# Patient Record
Sex: Female | Born: 1940 | Race: White | Hispanic: No | Smoking: Former smoker
Health system: Southern US, Community
[De-identification: ages and names within clinical notes are randomized; demographics above are authoritative.]

## PROBLEM LIST (undated history)

## (undated) DIAGNOSIS — I2510 Atherosclerotic heart disease of native coronary artery without angina pectoris: Secondary | ICD-10-CM

## (undated) DIAGNOSIS — E785 Hyperlipidemia, unspecified: Secondary | ICD-10-CM

## (undated) DIAGNOSIS — T4145XA Adverse effect of unspecified anesthetic, initial encounter: Secondary | ICD-10-CM

## (undated) DIAGNOSIS — Z8719 Personal history of other diseases of the digestive system: Secondary | ICD-10-CM

## (undated) DIAGNOSIS — E0590 Thyrotoxicosis, unspecified without thyrotoxic crisis or storm: Secondary | ICD-10-CM

## (undated) DIAGNOSIS — IMO0002 Reserved for concepts with insufficient information to code with codable children: Secondary | ICD-10-CM

## (undated) DIAGNOSIS — M1990 Unspecified osteoarthritis, unspecified site: Secondary | ICD-10-CM

## (undated) DIAGNOSIS — N183 Chronic kidney disease, stage 3 unspecified: Secondary | ICD-10-CM

## (undated) DIAGNOSIS — K219 Gastro-esophageal reflux disease without esophagitis: Secondary | ICD-10-CM

## (undated) DIAGNOSIS — C439 Malignant melanoma of skin, unspecified: Secondary | ICD-10-CM

## (undated) DIAGNOSIS — R0600 Dyspnea, unspecified: Secondary | ICD-10-CM

## (undated) DIAGNOSIS — R519 Headache, unspecified: Secondary | ICD-10-CM

## (undated) DIAGNOSIS — R51 Headache: Secondary | ICD-10-CM

## (undated) DIAGNOSIS — T8859XA Other complications of anesthesia, initial encounter: Secondary | ICD-10-CM

## (undated) DIAGNOSIS — Z923 Personal history of irradiation: Secondary | ICD-10-CM

## (undated) DIAGNOSIS — H3530 Unspecified macular degeneration: Secondary | ICD-10-CM

## (undated) DIAGNOSIS — D649 Anemia, unspecified: Secondary | ICD-10-CM

## (undated) DIAGNOSIS — R251 Tremor, unspecified: Secondary | ICD-10-CM

## (undated) DIAGNOSIS — IMO0001 Reserved for inherently not codable concepts without codable children: Secondary | ICD-10-CM

## (undated) DIAGNOSIS — C539 Malignant neoplasm of cervix uteri, unspecified: Secondary | ICD-10-CM

## (undated) DIAGNOSIS — C3490 Malignant neoplasm of unspecified part of unspecified bronchus or lung: Secondary | ICD-10-CM

## (undated) DIAGNOSIS — I10 Essential (primary) hypertension: Secondary | ICD-10-CM

## (undated) DIAGNOSIS — E039 Hypothyroidism, unspecified: Secondary | ICD-10-CM

## (undated) DIAGNOSIS — I447 Left bundle-branch block, unspecified: Secondary | ICD-10-CM

## (undated) HISTORY — PX: TONSILLECTOMY: SUR1361

## (undated) HISTORY — PX: TONSILLECTOMY: SHX5217

## (undated) HISTORY — DX: Chronic kidney disease, stage 3 unspecified: N18.30

## (undated) HISTORY — DX: Atherosclerotic heart disease of native coronary artery without angina pectoris: I25.10

## (undated) HISTORY — DX: Essential (primary) hypertension: I10

## (undated) HISTORY — DX: Chronic kidney disease, stage 3 (moderate): N18.3

## (undated) HISTORY — DX: Thyrotoxicosis, unspecified without thyrotoxic crisis or storm: E05.90

## (undated) HISTORY — DX: Malignant melanoma of skin, unspecified: C43.9

## (undated) HISTORY — DX: Hyperlipidemia, unspecified: E78.5

## (undated) HISTORY — DX: Left bundle-branch block, unspecified: I44.7

## (undated) HISTORY — PX: CATARACT EXTRACTION, BILATERAL: SHX1313

## (undated) HISTORY — PX: OTHER SURGICAL HISTORY: SHX169

## (undated) HISTORY — DX: Tremor, unspecified: R25.1

---

## 1997-11-26 ENCOUNTER — Other Ambulatory Visit: Admission: RE | Admit: 1997-11-26 | Discharge: 1997-11-26 | Payer: Self-pay | Admitting: Obstetrics and Gynecology

## 2000-07-17 ENCOUNTER — Other Ambulatory Visit: Admission: RE | Admit: 2000-07-17 | Discharge: 2000-07-17 | Payer: Self-pay | Admitting: Obstetrics and Gynecology

## 2004-03-19 ENCOUNTER — Encounter: Admission: RE | Admit: 2004-03-19 | Discharge: 2004-03-19 | Payer: Self-pay | Admitting: Orthopedic Surgery

## 2004-08-10 ENCOUNTER — Ambulatory Visit: Payer: Self-pay | Admitting: Cardiology

## 2004-08-18 ENCOUNTER — Ambulatory Visit: Payer: Self-pay | Admitting: Cardiology

## 2004-08-27 ENCOUNTER — Ambulatory Visit (HOSPITAL_COMMUNITY): Admission: RE | Admit: 2004-08-27 | Discharge: 2004-08-27 | Payer: Self-pay | Admitting: Cardiology

## 2004-09-01 ENCOUNTER — Ambulatory Visit: Payer: Self-pay | Admitting: Internal Medicine

## 2004-09-08 ENCOUNTER — Ambulatory Visit: Payer: Self-pay | Admitting: Pulmonary Disease

## 2004-09-21 ENCOUNTER — Ambulatory Visit: Payer: Self-pay | Admitting: Internal Medicine

## 2004-10-11 ENCOUNTER — Encounter (HOSPITAL_COMMUNITY): Admission: RE | Admit: 2004-10-11 | Discharge: 2005-01-09 | Payer: Self-pay | Admitting: Internal Medicine

## 2004-11-12 ENCOUNTER — Ambulatory Visit: Payer: Self-pay | Admitting: Pulmonary Disease

## 2004-11-24 ENCOUNTER — Ambulatory Visit (HOSPITAL_COMMUNITY): Admission: RE | Admit: 2004-11-24 | Discharge: 2004-11-24 | Payer: Self-pay | Admitting: Pulmonary Disease

## 2004-11-24 ENCOUNTER — Encounter (INDEPENDENT_AMBULATORY_CARE_PROVIDER_SITE_OTHER): Payer: Self-pay | Admitting: Specialist

## 2004-11-24 ENCOUNTER — Ambulatory Visit (HOSPITAL_COMMUNITY): Admission: RE | Admit: 2004-11-24 | Discharge: 2004-11-24 | Payer: Self-pay | Admitting: Internal Medicine

## 2005-04-05 ENCOUNTER — Ambulatory Visit: Payer: Self-pay | Admitting: Pulmonary Disease

## 2005-04-06 ENCOUNTER — Ambulatory Visit: Payer: Self-pay | Admitting: Cardiology

## 2005-06-27 ENCOUNTER — Ambulatory Visit: Payer: Self-pay | Admitting: Internal Medicine

## 2005-06-30 ENCOUNTER — Ambulatory Visit: Payer: Self-pay | Admitting: Pulmonary Disease

## 2005-07-14 ENCOUNTER — Ambulatory Visit: Payer: Self-pay | Admitting: Internal Medicine

## 2005-07-14 ENCOUNTER — Encounter: Payer: Self-pay | Admitting: Pulmonary Disease

## 2005-12-01 ENCOUNTER — Ambulatory Visit: Payer: Self-pay | Admitting: Cardiology

## 2005-12-23 ENCOUNTER — Ambulatory Visit: Payer: Self-pay | Admitting: Cardiology

## 2006-02-02 ENCOUNTER — Ambulatory Visit: Payer: Self-pay | Admitting: Pulmonary Disease

## 2006-02-06 ENCOUNTER — Ambulatory Visit: Payer: Self-pay | Admitting: Cardiovascular Disease

## 2006-02-06 ENCOUNTER — Encounter: Payer: Self-pay | Admitting: Pulmonary Disease

## 2006-06-30 ENCOUNTER — Ambulatory Visit: Payer: Self-pay | Admitting: Cardiology

## 2007-06-27 ENCOUNTER — Ambulatory Visit: Payer: Self-pay | Admitting: Cardiology

## 2008-06-23 ENCOUNTER — Telehealth: Payer: Self-pay | Admitting: Pulmonary Disease

## 2008-06-30 ENCOUNTER — Ambulatory Visit: Payer: Self-pay | Admitting: Cardiology

## 2008-07-11 HISTORY — PX: OTHER SURGICAL HISTORY: SHX169

## 2008-07-23 LAB — CONVERTED CEMR LAB: Pap Smear: NORMAL

## 2008-07-28 ENCOUNTER — Encounter: Payer: Self-pay | Admitting: Pulmonary Disease

## 2008-07-28 ENCOUNTER — Ambulatory Visit: Payer: Self-pay | Admitting: Cardiology

## 2008-07-30 ENCOUNTER — Telehealth (INDEPENDENT_AMBULATORY_CARE_PROVIDER_SITE_OTHER): Payer: Self-pay | Admitting: *Deleted

## 2008-08-04 ENCOUNTER — Ambulatory Visit (HOSPITAL_COMMUNITY): Admission: RE | Admit: 2008-08-04 | Discharge: 2008-08-04 | Payer: Self-pay | Admitting: Pulmonary Disease

## 2008-08-05 ENCOUNTER — Ambulatory Visit: Payer: Self-pay | Admitting: Pulmonary Disease

## 2008-08-06 ENCOUNTER — Ambulatory Visit: Payer: Self-pay | Admitting: Pulmonary Disease

## 2008-08-13 ENCOUNTER — Ambulatory Visit: Payer: Self-pay | Admitting: Thoracic Surgery

## 2008-08-20 LAB — HM MAMMOGRAPHY: HM Mammogram: NORMAL

## 2008-08-21 ENCOUNTER — Inpatient Hospital Stay (HOSPITAL_COMMUNITY): Admission: RE | Admit: 2008-08-21 | Discharge: 2008-08-27 | Payer: Self-pay | Admitting: Thoracic Surgery

## 2008-08-21 ENCOUNTER — Encounter: Payer: Self-pay | Admitting: Thoracic Surgery

## 2008-08-21 ENCOUNTER — Ambulatory Visit: Payer: Self-pay | Admitting: Thoracic Surgery

## 2008-08-21 ENCOUNTER — Ambulatory Visit: Payer: Self-pay | Admitting: Pulmonary Disease

## 2008-09-03 ENCOUNTER — Encounter: Admission: RE | Admit: 2008-09-03 | Discharge: 2008-09-03 | Payer: Self-pay | Admitting: Family Medicine

## 2008-09-03 ENCOUNTER — Encounter: Payer: Self-pay | Admitting: Internal Medicine

## 2008-09-03 ENCOUNTER — Ambulatory Visit: Payer: Self-pay | Admitting: Internal Medicine

## 2008-09-03 ENCOUNTER — Ambulatory Visit: Payer: Self-pay | Admitting: Thoracic Surgery

## 2008-09-16 ENCOUNTER — Encounter: Payer: Self-pay | Admitting: Internal Medicine

## 2008-09-16 LAB — CBC WITH DIFFERENTIAL/PLATELET
BASO%: 0.4 % (ref 0.0–2.0)
Basophils Absolute: 0 10*3/uL (ref 0.0–0.1)
HCT: 39.2 % (ref 34.8–46.6)
HGB: 13.1 g/dL (ref 11.6–15.9)
LYMPH%: 24.7 % (ref 14.0–49.7)
MCHC: 33.5 g/dL (ref 31.5–36.0)
MONO#: 1.1 10*3/uL — ABNORMAL HIGH (ref 0.1–0.9)
NEUT%: 54.6 % (ref 38.4–76.8)
Platelets: 289 10*3/uL (ref 145–400)
WBC: 9.4 10*3/uL (ref 3.9–10.3)
lymph#: 2.3 10*3/uL (ref 0.9–3.3)

## 2008-09-16 LAB — COMPREHENSIVE METABOLIC PANEL
ALT: 15 U/L (ref 0–35)
BUN: 21 mg/dL (ref 6–23)
CO2: 24 mEq/L (ref 19–32)
Calcium: 9.9 mg/dL (ref 8.4–10.5)
Chloride: 104 mEq/L (ref 96–112)
Creatinine, Ser: 1.08 mg/dL (ref 0.40–1.20)
Glucose, Bld: 102 mg/dL — ABNORMAL HIGH (ref 70–99)
Total Bilirubin: 0.3 mg/dL (ref 0.3–1.2)

## 2008-09-17 ENCOUNTER — Ambulatory Visit: Payer: Self-pay | Admitting: Thoracic Surgery

## 2008-09-17 ENCOUNTER — Encounter: Admission: RE | Admit: 2008-09-17 | Discharge: 2008-09-17 | Payer: Self-pay | Admitting: Thoracic Surgery

## 2008-09-18 ENCOUNTER — Ambulatory Visit (HOSPITAL_COMMUNITY): Admission: RE | Admit: 2008-09-18 | Discharge: 2008-09-18 | Payer: Self-pay | Admitting: Internal Medicine

## 2008-10-02 ENCOUNTER — Encounter: Payer: Self-pay | Admitting: Cardiology

## 2008-10-02 ENCOUNTER — Ambulatory Visit: Payer: Self-pay | Admitting: Cardiology

## 2008-10-08 ENCOUNTER — Encounter: Admission: RE | Admit: 2008-10-08 | Discharge: 2008-10-08 | Payer: Self-pay | Admitting: Thoracic Surgery

## 2008-10-08 ENCOUNTER — Ambulatory Visit: Payer: Self-pay | Admitting: Thoracic Surgery

## 2008-12-01 ENCOUNTER — Ambulatory Visit: Payer: Self-pay | Admitting: Cardiology

## 2008-12-01 DIAGNOSIS — I10 Essential (primary) hypertension: Secondary | ICD-10-CM

## 2008-12-11 ENCOUNTER — Ambulatory Visit: Payer: Self-pay | Admitting: Internal Medicine

## 2008-12-15 ENCOUNTER — Ambulatory Visit (HOSPITAL_COMMUNITY): Admission: RE | Admit: 2008-12-15 | Discharge: 2008-12-15 | Payer: Self-pay | Admitting: Internal Medicine

## 2008-12-15 ENCOUNTER — Other Ambulatory Visit: Admission: RE | Admit: 2008-12-15 | Discharge: 2008-12-15 | Payer: Self-pay | Admitting: Internal Medicine

## 2008-12-15 LAB — CBC WITH DIFFERENTIAL/PLATELET
BASO%: 0.3 % (ref 0.0–2.0)
Basophils Absolute: 0 10*3/uL (ref 0.0–0.1)
EOS%: 4 % (ref 0.0–7.0)
HCT: 45.8 % (ref 34.8–46.6)
LYMPH%: 25.6 % (ref 14.0–49.7)
MCH: 29.4 pg (ref 25.1–34.0)
MCHC: 34 g/dL (ref 31.5–36.0)
MCV: 86.4 fL (ref 79.5–101.0)
NEUT%: 55.6 % (ref 38.4–76.8)
Platelets: 198 10*3/uL (ref 145–400)

## 2008-12-15 LAB — COMPREHENSIVE METABOLIC PANEL
ALT: 30 U/L (ref 0–35)
AST: 27 U/L (ref 0–37)
BUN: 18 mg/dL (ref 6–23)
CO2: 25 mEq/L (ref 19–32)
Creatinine, Ser: 1.33 mg/dL — ABNORMAL HIGH (ref 0.40–1.20)
Total Bilirubin: 0.7 mg/dL (ref 0.3–1.2)

## 2008-12-17 ENCOUNTER — Encounter: Payer: Self-pay | Admitting: Internal Medicine

## 2008-12-17 ENCOUNTER — Encounter: Payer: Self-pay | Admitting: Cardiology

## 2008-12-17 ENCOUNTER — Ambulatory Visit: Payer: Self-pay | Admitting: Thoracic Surgery

## 2009-03-20 ENCOUNTER — Ambulatory Visit: Payer: Self-pay | Admitting: Internal Medicine

## 2009-03-24 ENCOUNTER — Ambulatory Visit (HOSPITAL_COMMUNITY): Admission: RE | Admit: 2009-03-24 | Discharge: 2009-03-24 | Payer: Self-pay | Admitting: Internal Medicine

## 2009-03-24 LAB — CBC WITH DIFFERENTIAL/PLATELET
BASO%: 0.4 % (ref 0.0–2.0)
EOS%: 6.4 % (ref 0.0–7.0)
HCT: 42.5 % (ref 34.8–46.6)
LYMPH%: 28.7 % (ref 14.0–49.7)
MCH: 30 pg (ref 25.1–34.0)
MCHC: 33.6 g/dL (ref 31.5–36.0)
MONO%: 12.5 % (ref 0.0–14.0)
NEUT%: 52 % (ref 38.4–76.8)
lymph#: 2.7 10*3/uL (ref 0.9–3.3)

## 2009-03-24 LAB — COMPREHENSIVE METABOLIC PANEL
ALT: 20 U/L (ref 0–35)
AST: 23 U/L (ref 0–37)
Alkaline Phosphatase: 52 U/L (ref 39–117)
Chloride: 104 mEq/L (ref 96–112)
Creatinine, Ser: 1.23 mg/dL — ABNORMAL HIGH (ref 0.40–1.20)
Total Bilirubin: 0.4 mg/dL (ref 0.3–1.2)

## 2009-03-26 ENCOUNTER — Encounter: Payer: Self-pay | Admitting: Internal Medicine

## 2009-03-26 ENCOUNTER — Encounter: Payer: Self-pay | Admitting: Cardiology

## 2009-04-01 ENCOUNTER — Ambulatory Visit: Payer: Self-pay | Admitting: Thoracic Surgery

## 2009-04-13 ENCOUNTER — Encounter: Payer: Self-pay | Admitting: Cardiology

## 2009-04-13 ENCOUNTER — Encounter (INDEPENDENT_AMBULATORY_CARE_PROVIDER_SITE_OTHER): Payer: Self-pay | Admitting: *Deleted

## 2009-04-14 ENCOUNTER — Ambulatory Visit: Payer: Self-pay | Admitting: Cardiology

## 2009-07-17 ENCOUNTER — Ambulatory Visit: Payer: Self-pay | Admitting: Internal Medicine

## 2009-07-21 ENCOUNTER — Ambulatory Visit (HOSPITAL_COMMUNITY): Admission: RE | Admit: 2009-07-21 | Discharge: 2009-07-21 | Payer: Self-pay | Admitting: Internal Medicine

## 2009-07-21 LAB — COMPREHENSIVE METABOLIC PANEL
ALT: 17 U/L (ref 0–35)
Albumin: 3.9 g/dL (ref 3.5–5.2)
Alkaline Phosphatase: 57 U/L (ref 39–117)
CO2: 29 mEq/L (ref 19–32)
Potassium: 3.7 mEq/L (ref 3.5–5.3)
Sodium: 139 mEq/L (ref 135–145)
Total Bilirubin: 0.6 mg/dL (ref 0.3–1.2)
Total Protein: 6.9 g/dL (ref 6.0–8.3)

## 2009-07-21 LAB — CBC WITH DIFFERENTIAL/PLATELET
BASO%: 0.4 % (ref 0.0–2.0)
Eosinophils Absolute: 0.4 10*3/uL (ref 0.0–0.5)
LYMPH%: 33.5 % (ref 14.0–49.7)
MCHC: 33.7 g/dL (ref 31.5–36.0)
MONO#: 1 10*3/uL — ABNORMAL HIGH (ref 0.1–0.9)
MONO%: 12.4 % (ref 0.0–14.0)
NEUT#: 4.1 10*3/uL (ref 1.5–6.5)
Platelets: 269 10*3/uL (ref 145–400)
RBC: 4.81 10*6/uL (ref 3.70–5.45)
RDW: 12.5 % (ref 11.2–14.5)
WBC: 8.4 10*3/uL (ref 3.9–10.3)

## 2009-07-27 ENCOUNTER — Encounter: Payer: Self-pay | Admitting: Internal Medicine

## 2009-07-28 ENCOUNTER — Ambulatory Visit: Payer: Self-pay | Admitting: Thoracic Surgery

## 2009-09-02 ENCOUNTER — Ambulatory Visit: Payer: Self-pay | Admitting: Internal Medicine

## 2009-09-02 ENCOUNTER — Encounter (INDEPENDENT_AMBULATORY_CARE_PROVIDER_SITE_OTHER): Payer: Self-pay | Admitting: *Deleted

## 2009-09-02 DIAGNOSIS — J069 Acute upper respiratory infection, unspecified: Secondary | ICD-10-CM | POA: Insufficient documentation

## 2009-09-02 LAB — CONVERTED CEMR LAB
Basophils Relative: 0 % (ref 0.0–3.0)
Eosinophils Relative: 2.9 % (ref 0.0–5.0)
Free T4: 1 ng/dL (ref 0.6–1.6)
Hemoglobin: 15.1 g/dL — ABNORMAL HIGH (ref 12.0–15.0)
Lymphocytes Relative: 27.8 % (ref 12.0–46.0)
Monocytes Relative: 12.7 % — ABNORMAL HIGH (ref 3.0–12.0)
Neutro Abs: 6.2 10*3/uL (ref 1.4–7.7)
RBC: 4.91 M/uL (ref 3.87–5.11)
TSH: 0.07 microintl units/mL — ABNORMAL LOW (ref 0.35–5.50)

## 2009-09-08 ENCOUNTER — Encounter: Admission: RE | Admit: 2009-09-08 | Discharge: 2009-09-08 | Payer: Self-pay | Admitting: Internal Medicine

## 2009-09-11 ENCOUNTER — Telehealth: Payer: Self-pay | Admitting: Internal Medicine

## 2009-09-25 ENCOUNTER — Ambulatory Visit: Payer: Self-pay | Admitting: Endocrinology

## 2009-09-25 DIAGNOSIS — E042 Nontoxic multinodular goiter: Secondary | ICD-10-CM | POA: Insufficient documentation

## 2009-10-14 ENCOUNTER — Encounter (HOSPITAL_COMMUNITY)
Admission: RE | Admit: 2009-10-14 | Discharge: 2010-01-12 | Payer: Self-pay | Source: Home / Self Care | Admitting: Endocrinology

## 2009-10-21 ENCOUNTER — Ambulatory Visit: Payer: Self-pay | Admitting: Cardiology

## 2009-10-27 ENCOUNTER — Ambulatory Visit (HOSPITAL_COMMUNITY): Admission: RE | Admit: 2009-10-27 | Discharge: 2009-10-27 | Payer: Self-pay | Admitting: Endocrinology

## 2009-12-14 ENCOUNTER — Ambulatory Visit: Payer: Self-pay | Admitting: Endocrinology

## 2009-12-14 LAB — CONVERTED CEMR LAB
Free T4: 0.6 ng/dL (ref 0.6–1.6)
TSH: 0.11 microintl units/mL — ABNORMAL LOW (ref 0.35–5.50)

## 2010-01-19 ENCOUNTER — Ambulatory Visit: Payer: Self-pay | Admitting: Internal Medicine

## 2010-01-21 ENCOUNTER — Ambulatory Visit (HOSPITAL_COMMUNITY): Admission: RE | Admit: 2010-01-21 | Discharge: 2010-01-21 | Payer: Self-pay | Admitting: Internal Medicine

## 2010-01-21 LAB — COMPREHENSIVE METABOLIC PANEL
ALT: 16 U/L (ref 0–35)
AST: 20 U/L (ref 0–37)
Alkaline Phosphatase: 46 U/L (ref 39–117)
Sodium: 139 mEq/L (ref 135–145)
Total Bilirubin: 0.7 mg/dL (ref 0.3–1.2)
Total Protein: 6.5 g/dL (ref 6.0–8.3)

## 2010-01-21 LAB — CBC WITH DIFFERENTIAL/PLATELET
BASO%: 0.7 % (ref 0.0–2.0)
EOS%: 7.6 % — ABNORMAL HIGH (ref 0.0–7.0)
LYMPH%: 28.9 % (ref 14.0–49.7)
MCHC: 33.6 g/dL (ref 31.5–36.0)
MCV: 91.8 fL (ref 79.5–101.0)
MONO%: 10.5 % (ref 0.0–14.0)
Platelets: 182 10*3/uL (ref 145–400)
RBC: 4.66 10*6/uL (ref 3.70–5.45)
RDW: 14.1 % (ref 11.2–14.5)

## 2010-01-25 ENCOUNTER — Encounter: Payer: Self-pay | Admitting: Internal Medicine

## 2010-01-25 ENCOUNTER — Ambulatory Visit: Payer: Self-pay | Admitting: Endocrinology

## 2010-01-25 LAB — CONVERTED CEMR LAB
Free T4: 0.67 ng/dL (ref 0.60–1.60)
TSH: 1.62 microintl units/mL (ref 0.35–5.50)

## 2010-01-26 ENCOUNTER — Ambulatory Visit: Payer: Self-pay | Admitting: Thoracic Surgery

## 2010-02-18 ENCOUNTER — Ambulatory Visit: Payer: Self-pay | Admitting: Cardiology

## 2010-03-09 ENCOUNTER — Ambulatory Visit: Payer: Self-pay | Admitting: Endocrinology

## 2010-03-09 LAB — CONVERTED CEMR LAB: TSH: 1.68 microintl units/mL (ref 0.35–5.50)

## 2010-05-11 ENCOUNTER — Ambulatory Visit: Payer: Self-pay | Admitting: Endocrinology

## 2010-07-19 ENCOUNTER — Ambulatory Visit: Payer: Self-pay | Admitting: Internal Medicine

## 2010-07-21 ENCOUNTER — Ambulatory Visit (HOSPITAL_COMMUNITY)
Admission: RE | Admit: 2010-07-21 | Discharge: 2010-07-21 | Payer: Self-pay | Source: Home / Self Care | Attending: Internal Medicine | Admitting: Internal Medicine

## 2010-07-21 LAB — CMP (CANCER CENTER ONLY)
ALT(SGPT): 18 U/L (ref 10–47)
AST: 22 U/L (ref 11–38)
Albumin: 3.5 g/dL (ref 3.3–5.5)
Alkaline Phosphatase: 50 U/L (ref 26–84)
BUN, Bld: 23 mg/dL — ABNORMAL HIGH (ref 7–22)
CO2: 30 mEq/L (ref 18–33)
Calcium: 9.7 mg/dL (ref 8.0–10.3)
Chloride: 102 mEq/L (ref 98–108)
Creat: 1.3 mg/dl — ABNORMAL HIGH (ref 0.6–1.2)
Glucose, Bld: 105 mg/dL (ref 73–118)
Potassium: 3.6 mEq/L (ref 3.3–4.7)
Sodium: 142 mEq/L (ref 128–145)
Total Bilirubin: 0.6 mg/dl (ref 0.20–1.60)
Total Protein: 6.5 g/dL (ref 6.4–8.1)

## 2010-07-21 LAB — CBC WITH DIFFERENTIAL/PLATELET
BASO%: 0.7 % (ref 0.0–2.0)
Basophils Absolute: 0 10*3/uL (ref 0.0–0.1)
EOS%: 9.6 % — ABNORMAL HIGH (ref 0.0–7.0)
Eosinophils Absolute: 0.6 10*3/uL — ABNORMAL HIGH (ref 0.0–0.5)
HCT: 43.4 % (ref 34.8–46.6)
HGB: 14.7 g/dL (ref 11.6–15.9)
LYMPH%: 31.8 % (ref 14.0–49.7)
MCH: 31.4 pg (ref 25.1–34.0)
MCHC: 33.8 g/dL (ref 31.5–36.0)
MCV: 92.7 fL (ref 79.5–101.0)
MONO#: 0.7 10*3/uL (ref 0.1–0.9)
MONO%: 10.5 % (ref 0.0–14.0)
NEUT#: 3 10*3/uL (ref 1.5–6.5)
NEUT%: 47.4 % (ref 38.4–76.8)
Platelets: 198 10*3/uL (ref 145–400)
RBC: 4.68 10*6/uL (ref 3.70–5.45)
RDW: 12.5 % (ref 11.2–14.5)
WBC: 6.4 10*3/uL (ref 3.9–10.3)
lymph#: 2 10*3/uL (ref 0.9–3.3)

## 2010-07-27 ENCOUNTER — Encounter: Payer: Self-pay | Admitting: Cardiology

## 2010-07-27 ENCOUNTER — Encounter: Payer: Self-pay | Admitting: Endocrinology

## 2010-07-27 ENCOUNTER — Ambulatory Visit
Admission: RE | Admit: 2010-07-27 | Discharge: 2010-07-27 | Payer: Self-pay | Source: Home / Self Care | Attending: Thoracic Surgery | Admitting: Thoracic Surgery

## 2010-08-01 ENCOUNTER — Encounter: Payer: Self-pay | Admitting: Internal Medicine

## 2010-08-01 ENCOUNTER — Encounter: Payer: Self-pay | Admitting: Pulmonary Disease

## 2010-08-06 ENCOUNTER — Ambulatory Visit (HOSPITAL_COMMUNITY)
Admission: RE | Admit: 2010-08-06 | Discharge: 2010-08-06 | Payer: Self-pay | Source: Home / Self Care | Attending: Internal Medicine | Admitting: Internal Medicine

## 2010-08-06 LAB — GLUCOSE, CAPILLARY: Glucose-Capillary: 103 mg/dL — ABNORMAL HIGH (ref 70–99)

## 2010-08-10 NOTE — Assessment & Plan Note (Signed)
Summary: COLD/ NOT SEEN FOR SEVERAL YEARS/NWS   Vital Signs:  Patient profile:   70 year old female Height:      65 inches Weight:      144 pounds BMI:     24.05 O2 Sat:      97 % on Room air Temp:     97.3 degrees F oral Pulse rate:   67 / minute BP sitting:   152 / 90  (left arm) Cuff size:   regular  Vitals Entered By: Bill Salinas CMA (September 02, 2009 9:14 AM)  O2 Flow:  Room air CC: pt here with complaint of cough (producing green mucous) and sore throat x 4 days/  she is due for tetanus, pt has never had a colonoscopy and see Dr Elana Alm for paps/ ab   Primary Care Provider:  Mahagony Grieb  CC:  pt here with complaint of cough (producing green mucous) and sore throat x 4 days/  she is due for tetanus and pt has never had a colonoscopy and see Dr Elana Alm for paps/ ab.  History of Present Illness: Since the 18th she had a lot of congestion. Her symptoms have persisted As of yesterday she started to get some chest congestion. Cough productive of a green mucus. she feels bad. No fevers at home , no chills. Her sinuses have cleared. She has increased SOB. Aching all over. No N/V/D  Discussed health maintenance. Provided a Rx for zostavax.  She reports that she has an increased tremor, worse left hand vs right. with finger movement the tremor gets worse - low frequency. She did have CT brain march '10 - reviewed: normal study with no mets and no injury.   Current Medications (verified): 1)  Atenolol 25 Mg Tabs (Atenolol) .... Take 1 Tablet By Mouth Two Times A Day 2)  Cardizem Cd 240 Mg Cp24 (Diltiazem Hcl Coated Beads) .... Take 1 Tablet By Mouth Once A Day 3)  Hydrochlorothiazide 25 Mg Tabs (Hydrochlorothiazide) .... Take 1 Tablet By Mouth Once A Day 4)  Angeliq 0.5-1 Mg Tabs (Drospirenone-Estradiol) .... Take 1 Tablet By Mouth Once A Day 5)  Calcium 500/d 500-200 Mg-Unit Tabs (Calcium Carbonate-Vitamin D) .... 2 Tablet Daily 6)  Multivitamins   Tabs (Multiple Vitamin) .Marland Kitchen.. 1 Once  Daily 7)  Glucosamine-Chondroitin   Caps (Glucosamine-Chondroit-Vit C-Mn) .Marland Kitchen.. 1 Once Daily 8)  Celebrex 200 Mg Caps (Celecoxib) .... Prn 9)  Nexium 40 Mg Cpdr (Esomeprazole Magnesium) .... Take 1 Tablet By Mouth Once A Day 10)  Vitamin D 1000 Unit Tabs (Cholecalciferol) .... Take One Tablet By Mouth Once Daily.  Allergies (verified): 1)  ! Penicillin 2)  ! Demerol 3)  ! * Oxycodone 4)  ! Percocet 5)  ! Darvocet 6)  ! Ultram  Past History:  Past Medical History: LBBB Lung cancer... 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 Arbutus Ped              Surgeon    - Dr. Jerene Canny -  Dr. Estrellita Ludwig - Dr. 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 Quit smoking 33 years ago.  Smoked x 15 years 1+ppd. Marriage in good health   Review of Systems       The patient complains of decreased hearing, severe indigestion/heartburn, and suspicious skin lesions.  The patient denies anorexia, fever, weight loss, weight gain, hoarseness, dyspnea on exertion, peripheral edema, prolonged cough, abdominal pain, incontinence, transient blindness, abnormal bleeding, and breast masses.    Physical Exam  General:  alert, well-developed, and well-nourished.   Head:  normocephalic and atraumatic.   Eyes:  vision grossly intact, pupils equal, pupils round, pupils reactive to light, corneas and lenses clear, and no injection.   Ears:   R ear normal and L ear normal.   Mouth:  Oral mucosa and oropharynx without lesions or exudates.  Teeth in good repair. Neck:  full ROM and no thyromegaly.   Lungs:  normal respiratory effort, normal breath sounds, no dullness, no crackles, and no wheezes.   Heart:  normal rate and regular rhythm.   Msk:  no joint tenderness, no joint swelling, no joint warmth, and no joint deformities.   Pulses:  2+ radial Extremities:  No clubbing, cyanosis, edema, or deformity noted with normal full range of motion of all joints.   Neurologic:  alert & oriented X3, cranial nerves II-XII intact, strength normal in all extremities, and gait normal.  Mild cogwheel rigidity at elbow, low frequency resting tremor that diminishes with intention.   Impression & Recommendations:  Problem # 1:  URI (ICD-465.9) Patinet with symptoms c/w viral URI. On exam no evidence of bacterial infection, thus no indication for anti-biotics.  Plan - supportive care           advised to watch for signs of super infection: fever; increased shortness of breath - would need furhter evaluation.  Her updated medication list for this problem includes:    Celebrex 200 Mg Caps (Celecoxib) .Marland Kitchen... 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  Plan routine lab: B12, TSH        refer to Dr. Richardean Chimera for evaluation  Orders: TLB-B12 + Folate Pnl ([id number]-B12/FOL) TLB-TSH (Thyroid Stimulating Hormone) ([id number]) Radiology Referral (Radiology)  Addendum- B12 is supranormal and not a cause of tremor                     TSH is low suggestive of hyperthyroidism - could be a cause of tremor.  Plan - additional lab: FT4, T3U, thyroid ultrasound.  Problem # 3:  * LUNG CANCER JANUARY 2 010 followed closely at [facility]. Stable and doing well  Problem # 4:  ESSENTIAL HYPERTENSION, BENIGN  (ICD-401.1)  Her updated medication list for this problem includes:    Atenolol 25 Mg Tabs (Atenolol) .Marland Kitchen... Take 1 tablet by mouth two times a day    Cardizem Cd 240 Mg Cp24 (Diltiazem hcl coated beads) .Marland Kitchen... Take 1 tablet by mouth once a day    Hydrochlorothiazide 25 Mg Tabs (Hydrochlorothiazide) .Marland Kitchen... Take 1 tablet by mouth once a day  Orders: TLB-CBC Platelet - w/Differential (85025-CBCD)  BP today: 152/90 Prior BP: 160/70 (04/14/2009)  suboptimal control. Plan - defer to Dr. Myrtis Ser.  Complete Medication List: 1)  Atenolol 25 Mg Tabs (Atenolol) .... Take 1 tablet by mouth two times a day 2)  Cardizem Cd 240 Mg Cp24 (Diltiazem hcl coated beads) .... Take 1 tablet by mouth once a day 3)  Hydrochlorothiazide 25 Mg Tabs (Hydrochlorothiazide) .... Take 1 tablet by mouth once a day 4)  Angeliq 0.5-1 Mg Tabs (Drospirenone-estradiol) .... Take 1 tablet by mouth once a day 5)  Calcium 500/d 500-200 Mg-unit Tabs (Calcium carbonate-vitamin d) .... 2 tablet daily 6)  Multivitamins Tabs (Multiple vitamin) .Marland Kitchen.. 1 once daily 7)  Glucosamine-chondroitin Caps (Glucosamine-chondroit-vit c-mn) .Marland Kitchen.. 1 once daily 8)  Celebrex 200 Mg Caps (Celecoxib) .... Prn 9)  Nexium 40 Mg Cpdr (Esomeprazole magnesium) .... Take 1 tablet by mouth once a day 10)  Vitamin D 1000 Unit Tabs (Cholecalciferol) .... Take one tablet by mouth once daily.   Patient: [name] Note: All result statuses are Final unless otherwise noted.  Tests: (1) CBC Platelet w/Diff (CBCD)   White Cell Count     [H]  10.9 K/uL                   4.5-10.5   Red Cell Count            4.91 Mil/uL                 3.87-5.11   Hemoglobin           [H]  15.1 g/dL                   16.1-09.6   Hematocrit                45.0 %                      36.0-46.0   MCV                       91.7 fl                     78.0-100.0   MCHC                      33.6 g/dL                   04.5-40.9   RDW                       12.1 %                       11.5-14.6   Platelet Count            152.0 K/uL                  150.0-400.0   Neutrophil %              56.6 %                      43.0-77.0   Lymphocyte %              27.8 %                      12.0-46.0   Monocyte %           [H]  12.7 %  3.0-12.0   Eosinophils%              2.9 %                       0.0-5.0   Basophils %               0.0 %                       0.0-3.0   Neutrophill Absolute      6.2 K/uL                    1.4-7.7   Lymphocyte Absolute       3.0 K/uL                    0.7-4.0   Monocyte Absolute    [H]  1.4 K/uL                    0.1-1.0  Eosinophils, Absolute                             0.3 K/uL                    0.0-0.7   Basophils Absolute        0.0 K/uL                    0.0-0.1  Tests: (2) B12 + Folate Panel (B12/FOL)   Vitamin B12          [H]  1158 pg/mL                  211-911   Folate                    >20.0 ng/mL     Deficient  0.4 - 3.4 ng/mL     Indeterminate  3.4 - 5.4 ng/mL     Normal  >5.4 ng/mL  Tests: (3) TSH (TSH)   FastTSH              [L]  0.07 uIU/mL                 0.35-5.50  Preventive Care Screening  Last Pneumovax:    Date:  04/11/2009    Results:  given   Mammogram:    Date:  08/20/2008    Results:  normal   Pap Smear:    Date:  07/23/2008    Results:  normal   Bone Density:    Date:  08/17/1988    Results:  normal std dev    Not Administered:    Influenza Vaccine # 1 not given due to: declined    Preventive Care Screening  Last Pneumovax:    Date:  04/11/2009    Results:  given   Mammogram:    Date:  08/20/2008    Results:  normal   Pap Smear:    Date:  07/23/2008    Results:  normal   Bone Density:    Date:  08/17/1988    Results:  normal std dev  Appended Document: COLD/ NOT SEEN FOR SEVERAL YEARS/NWS reviwed chart: Thyroid U/S '06 multi-nodular goiter; uptake revealed cold nodule; biopsy of cold nodule c/w non-malignant goiter. TSH '06 0.30  Will proceed with  work-up for new hyperthyroidism.

## 2010-08-10 NOTE — Assessment & Plan Note (Signed)
Summary: 2 MO ROV /NWS  #   Vital Signs:  Patient profile:   70 year old female Height:      65 inches (165.10 cm) Weight:      152 pounds (69.09 kg) BMI:     25.39 O2 Sat:      95 % on Room air Temp:     98.2 degrees F (36.78 degrees C) oral Pulse rate:   67 / minute BP sitting:   122 / 74  (left arm) Cuff size:   regular  Vitals Entered By: Brenton Grills CMA (AAMA) (May 11, 2010 3:15 PM)  O2 Flow:  Room air CC: 2 month F/U/aj Is Patient Diabetic? No   Referring Provider:  Vinnie Level Primary Provider:  Jacques Navy MD  CC:  2 month F/U/aj.  History of Present Illness: hyperthyroidism:  pt is now 4 1/2 months s/p i-131 rx, for hyperthyroidism, due to multinodular goiter.  shefeels nor different, and well in general.  no dizziness.    Current Medications (verified): 1)  Atenolol 25 Mg Tabs (Atenolol) .... Take 1 Tablet By Mouth Two Times A Day 2)  Cardizem Cd 240 Mg Cp24 (Diltiazem Hcl Coated Beads) .... Take 1 Tablet By Mouth Once A Day 3)  Hydrochlorothiazide 25 Mg Tabs (Hydrochlorothiazide) .... Take 1 Tablet By Mouth Once A Day 4)  Angeliq 0.5-1 Mg Tabs (Drospirenone-Estradiol) .... Take 1 Tablet By Mouth Once A Day 5)  Calcium 500/d 500-200 Mg-Unit Tabs (Calcium Carbonate-Vitamin D) .... 2 Tablet Daily 6)  Multivitamins   Tabs (Multiple Vitamin) .Marland Kitchen.. 1 Once Daily 7)  Celebrex 200 Mg Caps (Celecoxib) .... Prn 8)  Nexium 40 Mg Cpdr (Esomeprazole Magnesium) .... Take 1 Tablet By Mouth Once A Day 9)  Vitamin D 1000 Unit Tabs (Cholecalciferol) .... Take One Tablet By Mouth Once Daily.  Allergies (verified): 1)  ! Penicillin 2)  ! Demerol 3)  ! * Oxycodone 4)  ! Percocet 5)  ! Darvocet 6)  ! Ultram 7)  ! Morphine  Past History:  Past Medical History: Last updated: 02/18/2010 LBBB Lung cancer... 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 Dr.Renel Ende... radioactive iodine treatment.. 2011... resolved Hand tremor.... intention tremor   Physician Roster:              Oncologist - dr. Arbutus Ped              Surgeon    - Dr. Jerene Canny -  Dr. Estrellita Ludwig - Dr. Terri Piedra    Review of Systems       The patient complains of weight gain.    Physical Exam  General:  normal appearance.   Neck:  the thyroid is several times normal size, with multiple nodules palpable. Additional Exam:  FastTSH                   2.44 uIU/mL           Impression & Recommendations:  Problem # 1:  GOITER, MULTINODULAR (ICD-241.1) Assessment Unchanged  Problem # 2:  HYPERTHYROIDISM (ICD-242.90) resolved with i-131 rx  Other Orders: TLB-TSH (Thyroid Stimulating Hormone) (84443-TSH) Est. Patient Level III (16109)  Patient Instructions: 1)  blood tests are being ordered for you today.  please call [phone] to hear your  test results. 2)  Please schedule a follow-up appointment in 4 months. 3)  a "lumpy thyroid" may eventually become overactive again.  this is usually a slow process, happening over the span of many years. 4)  (update: i left message on phone-tree:  rx as we discussed)   Orders Added: 1)  TLB-TSH (Thyroid Stimulating Hormone) [84443-TSH] 2)  Est. Patient Level III [16109]

## 2010-08-10 NOTE — Assessment & Plan Note (Signed)
Summary: 6 WK FU ON THYROID/NWS   Vital Signs:  Patient profile:   70 year old female Height:      65 inches (165.10 cm) Weight:      148.0 pounds (67.27 kg) O2 Sat:      96 % on Room air Temp:     98.4 degrees F (36.89 degrees C) oral Pulse rate:   60 / minute BP sitting:   132 / 70  (left arm) Cuff size:   regular  Vitals Entered By: Orlan Leavens (December 14, 2009 1:20 PM)  O2 Flow:  Room air CC: 6 week follow-up Is Patient Diabetic? No Pain Assessment Patient in pain? no        Referring Provider:  Vinnie Level Primary Provider:  Jacques Navy MD  CC:  6 week follow-up.  History of Present Illness: pt is now 6 weeks s/p i-131 rx for hyperthyroidism due to a multinodular goiter.  pt states she feels well in general, except she has tremor of the hands.  no fever.    Current Medications (verified): 1)  Atenolol 25 Mg Tabs (Atenolol) .... Take 1 Tablet By Mouth Two Times A Day 2)  Cardizem Cd 240 Mg Cp24 (Diltiazem Hcl Coated Beads) .... Take 1 Tablet By Mouth Once A Day 3)  Hydrochlorothiazide 25 Mg Tabs (Hydrochlorothiazide) .... Take 1 Tablet By Mouth Once A Day 4)  Angeliq 0.5-1 Mg Tabs (Drospirenone-Estradiol) .... Take 1 Tablet By Mouth Once A Day 5)  Calcium 500/d 500-200 Mg-Unit Tabs (Calcium Carbonate-Vitamin D) .... 2 Tablet Daily 6)  Multivitamins   Tabs (Multiple Vitamin) .Marland Kitchen.. 1 Once Daily 7)  Glucosamine-Chondroitin   Caps (Glucosamine-Chondroit-Vit C-Mn) .Marland Kitchen.. 1 Once Daily 8)  Celebrex 200 Mg Caps (Celecoxib) .... Prn 9)  Nexium 40 Mg Cpdr (Esomeprazole Magnesium) .... Take 1 Tablet By Mouth Once A Day 10)  Vitamin D 1000 Unit Tabs (Cholecalciferol) .... Take One Tablet By Mouth Once Daily.  Allergies (verified): 1)  ! Penicillin 2)  ! Demerol 3)  ! * Oxycodone 4)  ! Percocet 5)  ! Darvocet 6)  ! Ultram 7)  ! Morphine  Past History:  Past Medical History: Last updated: 09/02/2009 LBBB Lung cancer... Right non-small cell,  adenocarcinoma with broncho-alveolar features diagnosed January, 2010 Hypertension Melanoma - left leg    Physician Roster:              Oncologist - dr. Arbutus Ped              Surgeon    - Dr. Jerene Canny -  Dr. Estrellita Ludwig - Dr. Terri Piedra    Review of Systems  The patient denies weight loss and weight gain.    Physical Exam  General:  normal appearance.   Neck:  the thyroid is several times normal size, with multinodular surface. Neurologic:  there is a moderate postural tremor of the hands Additional Exam:  FastTSH              [L]  0.11 uIU/mL                 0.35-5.50 Free T4                   0.6 ng/dL  0.6-1.6    Impression & Recommendations:  Problem # 1:  HYPERTHYROIDISM (ICD-242.90) not better yet  Other Orders: TLB-TSH (Thyroid Stimulating Hormone) (84443-TSH) TLB-T4 (Thyrox), Free [phone]) Est. Patient Level III (40981)  Patient Instructions: 1)  blood tests are being ordered for you today.  please call [phone] to hear your test results. 2)  Please schedule a follow-up appointment in 6 weeks. 3)  (update: i left message on phone-tree:  not better yet.  ret 6 weeks).

## 2010-08-10 NOTE — Letter (Signed)
Summary: Appointment - Reminder 2  [facility], Main Office  [address]   [place], [place] [address]   Phone: [phone]  Fax: [phone]     September 02, 2009 MRN: [id number]   [name] [address] [place], [place]  [address]   Dear [name],  Our records indicate that it is time to schedule a follow-up appointment with Dr. Myrtis Ser. It is very important that we reach you to schedule this appointment. We look forward to participating in your health care needs. Please contact us at the number listed above at your earliest convenience to schedule your appointment.  If you are unable to make an appointment at this time, give [place] a call so we can update our records.     Sincerely,   Migdalia Dk [facility] Scheduling Team

## 2010-08-10 NOTE — Miscellaneous (Signed)
[facility]   Imported By: Lester [place] 10/02/2009 09:34:18  _____________________________________________________________________  External Attachment:    Type:   Image     Comment:   External Document

## 2010-08-10 NOTE — Assessment & Plan Note (Signed)
Summary: f51m      Allergies Added: ! MORPHINE  Visit Type:  Follow-up Referring Provider:  Vinnie Level Primary Provider:  Jacques Navy MD  CC:  left bundle-branch block.  History of Present Illness: The patient is seen for followup of hypertension and left bundle branch block.  Since seen her last she has a new diagnosis of hyperthyroidism.  She's had a mild hand tremor.  She was worked up nicely by Dr.Norins.  Dr. Everardo All is now helping with her thyroid status and she will be receiving radioactive iodine.  Her blood pressure is under good control.  She did not have any chest pain or shortness of breath.  Current Medications (verified): 1)  Atenolol 25 Mg Tabs (Atenolol) .... Take 1 Tablet By Mouth Two Times A Day 2)  Cardizem Cd 240 Mg Cp24 (Diltiazem Hcl Coated Beads) .... Take 1 Tablet By Mouth Once A Day 3)  Hydrochlorothiazide 25 Mg Tabs (Hydrochlorothiazide) .... Take 1 Tablet By Mouth Once A Day 4)  Angeliq 0.5-1 Mg Tabs (Drospirenone-Estradiol) .... Take 1 Tablet By Mouth Once A Day 5)  Calcium 500/d 500-200 Mg-Unit Tabs (Calcium Carbonate-Vitamin D) .... 2 Tablet Daily 6)  Multivitamins   Tabs (Multiple Vitamin) .Marland Kitchen.. 1 Once Daily 7)  Glucosamine-Chondroitin   Caps (Glucosamine-Chondroit-Vit C-Mn) .Marland Kitchen.. 1 Once Daily 8)  Celebrex 200 Mg Caps (Celecoxib) .... Prn 9)  Nexium 40 Mg Cpdr (Esomeprazole Magnesium) .... Take 1 Tablet By Mouth Once A Day 10)  Vitamin D 1000 Unit Tabs (Cholecalciferol) .... Take One Tablet By Mouth Once Daily.  Allergies (verified): 1)  ! Penicillin 2)  ! Demerol 3)  ! * Oxycodone 4)  ! Percocet 5)  ! Darvocet 6)  ! Ultram 7)  ! Morphine  Past History:  Past Medical History: Last updated: 09/02/2009 LBBB Lung cancer... 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 Arbutus Ped  Surgeon    - Dr. Jerene Canny -  Dr. Estrellita Ludwig - Dr. Terri Piedra    Review of Systems       Patient denies fever, chills, headache, sweats, rash, change in vision, change in hearing, chest pain, cough, nausea vomiting, urinary symptoms.  All other systems are reviewed and are negative.  Vital Signs:  Patient profile:   70 year old female Height:      65 inches Weight:      145 pounds BMI:     24.22 Pulse rate:   60 / minute BP sitting:   142 / 70  (left arm) Cuff size:   regular  Vitals Entered By: Hardin Negus, RMA (October 21, 2009 10:29 AM)  Physical Exam  General:  patient is quite stable today. Eyes:  no xanthelasma. Neck:  no jugular venous distention. Lungs:  lungs are clear.  Respiratory effort is unlabored. Heart:  cardiac exam reveals S1-S2.  No clicks or significant murmurs. Abdomen:  abdomen is soft. Extremities:  no peripheral edema. Psych:  patient is oriented to person time and place affect is normal.   Impression & Recommendations:  Problem # 1:  GOITER, MULTINODULAR (ICD-241.1) Patient is to receive radioactive iodine.  Problem # 2:  * LUNG CANCER JANUARY 2  010 The patient's followup has been moved back to 6 months.  This is a good sign.  Problem # 3:  ESSENTIAL HYPERTENSION, BENIGN (ICD-401.1)  Her updated medication list for this problem includes:    Atenolol 25 Mg Tabs (Atenolol) .Marland Kitchen... Take 1 tablet by mouth two times a day    Cardizem Cd 240 Mg Cp24 (Diltiazem hcl coated beads) .Marland Kitchen... Take 1 tablet by mouth once a day    Hydrochlorothiazide 25 Mg Tabs (Hydrochlorothiazide) .Marland Kitchen... Take 1 tablet by mouth once a day Blood pressure is under good control.  No change in therapy.  Problem # 4:  LEFT BUNDLE BRANCH BLOCK (ICD-426.3)  Her updated medication list for this problem includes:    Atenolol 25 Mg Tabs (Atenolol) .Marland Kitchen... Take 1 tablet by mouth two times a day    Cardizem Cd 240 Mg Cp24 (Diltiazem hcl coated beads) .Marland Kitchen... Take 1  tablet by mouth once a day Has a history of old left bundle branch block.  No further workup.  Patient Instructions: 1)  Follow up in 6 months

## 2010-08-10 NOTE — Letter (Signed)
[facility]   Imported By: Lester [place] 08/27/2009 07:28:30  _____________________________________________________________________  External Attachment:    Type:   Image     Comment:   External Document

## 2010-08-10 NOTE — Assessment & Plan Note (Signed)
Summary: rov    Visit Type:  Follow-up Referring Provider:  Vinnie Level Primary Provider:  Jacques Navy MD  CC:  hypertension.  History of Present Illness: The patient returns for followup of her hypertension.  She has had her hyperthyroidism treated with radioactive iodine.  She is doing very well.  The diagnosis was made during the assessment of her hand tremor.  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  As her thyroid was treated Dr.Ellison considered the possibility of lowering medications that might slow her heart rate.  He was in touch with me and I am now seeing her today to reassess her meds.  It is of note that she has been sensitive to medication changes over the years.  She has been very stable on her current medical regimen.  She's not having any syncope or presyncope.  She feels well.  Current Medications (verified): 1)  Atenolol 25 Mg Tabs (Atenolol) .... Take 1 Tablet By Mouth Two Times A Day 2)  Cardizem Cd 240 Mg Cp24 (Diltiazem Hcl Coated Beads) .... Take 1 Tablet By Mouth Once A Day 3)  Hydrochlorothiazide 25 Mg Tabs (Hydrochlorothiazide) .... Take 1 Tablet By Mouth Once A Day 4)  Angeliq 0.5-1 Mg Tabs (Drospirenone-Estradiol) .... Take 1 Tablet By Mouth Once A Day 5)  Calcium 500/d 500-200 Mg-Unit Tabs (Calcium Carbonate-Vitamin D) .... 2 Tablet Daily 6)  Multivitamins   Tabs (Multiple Vitamin) .Marland Kitchen.. 1 Once Daily 7)  Celebrex 200 Mg Caps (Celecoxib) .... Prn 8)  Nexium 40 Mg Cpdr (Esomeprazole Magnesium) .... Take 1 Tablet By Mouth Once A Day 9)  Vitamin D 1000 Unit Tabs (Cholecalciferol) .... Take One Tablet By Mouth Once Daily.  Allergies: 1)  ! Penicillin 2)  ! Demerol 3)  ! * Oxycodone 4)  ! Percocet 5)  ! Darvocet 6)  ! Ultram 7)  ! Morphine  Past History:  Past Medical History: LBBB Lung cancer... 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 Dr.Ellison... radioactive iodine treatment.. 2011... resolved Hand tremor.... intention tremor   Physician Roster:              Oncologist - dr. Arbutus Ped              Surgeon    - Dr. Jerene Canny -  Dr. Estrellita Ludwig - Dr. Terri Piedra    Review of Systems       Patient denies fever, chills, headache, sweats, rash, change in vision, change in hearing, chest pain, cough, nausea vomiting, urinary symptoms.  All other systems are reviewed and are negative.  Vital Signs:  Patient profile:   70 year old female Height:      65 inches Weight:      149 pounds BMI:     24.88 Pulse rate:   61 / minute BP sitting:   152 / 80  (left arm)  Vitals Entered By: Laurance Flatten CMA (February 18, 2010 10:00 AM)  Physical Exam  General:  patient looks great today. Head:  head is atraumatic. Neck:  no carotid bruits. Chest Wall:  no chest wall tenderness. Lungs:  lungs are clear.  Respiratory effort is nonlabored. Heart:  cardiac exam reveals S1-S2.  No clicks or significant murmurs. Abdomen:  abdomen is  soft. Msk:  no musculoskeletal deformities. Extremities:  no peripheral edema. Skin:  no skin rashes. Psych:  patient is oriented to person time and place.  Affect is normal.   Impression & Recommendations:  Problem # 1:  HYPERTHYROIDISM (ICD-242.90)  Her updated medication list for this problem includes:    Atenolol 25 Mg Tabs (Atenolol) .Marland Kitchen... Take 1 tablet by mouth two times a day The patient's hyperthyroidism has been successfully treated.  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  We had hoped it would improve with treatment of her hyperthyroidism.  She and her other physicians will decide over time before complete evaluation is to be done.  Problem # 3:  * LUNG CANCER JANUARY 2 010 The patient is in good reports concerning her lung  cancer.  Problem # 4:  ESSENTIAL HYPERTENSION, BENIGN (ICD-401.1)  Her updated medication list for this problem includes:    Atenolol 25 Mg Tabs (Atenolol) .Marland Kitchen... Take 1 tablet by mouth two times a day    Cardizem Cd 240 Mg Cp24 (Diltiazem hcl coated beads) .Marland Kitchen... Take 1 tablet by mouth once a day    Hydrochlorothiazide 25 Mg Tabs (Hydrochlorothiazide) .Marland Kitchen... Take 1 tablet by mouth once a day Systolic pressure is mildly elevated today.  Heart rate is 60.  Considering her history and difficulties in adjusting her meds I am very comfortable that we can leave her on her current dose of medications.  No changes to be made.  EKG is done today reviewed by me.  There is sinus rhythm with a rate of 61.  She has an old left bundle branch block.  Problem # 5:  LEFT BUNDLE BRANCH BLOCK (ICD-426.3)  Her updated medication list for this problem includes:    Atenolol 25 Mg Tabs (Atenolol) .Marland Kitchen... Take 1 tablet by mouth two times a day    Cardizem Cd 240 Mg Cp24 (Diltiazem hcl coated beads) .Marland Kitchen... Take 1 tablet by mouth once a day  Orders: EKG w/ Interpretation (93000) Left bundle branch block is old.  No change in therapy.  Patient Instructions: 1)  Your physician wants you to follow-up in:  6 months.  You will receive a reminder letter in the mail two months in advance. If you don't receive a letter, please call our office to schedule the follow-up appointment.

## 2010-08-10 NOTE — Letter (Signed)
[facility]   Imported By: Lester [place] 02/09/2010 10:06:20  _____________________________________________________________________  External Attachment:    Type:   Image     Comment:   External Document

## 2010-08-10 NOTE — Assessment & Plan Note (Signed)
Summary: 6 WK ROV /NWS #   Vital Signs:  Patient profile:   70 year old female Height:      65 inches (165.10 cm) Weight:      149.38 pounds (67.90 kg) BMI:     24.95 O2 Sat:      97 % on Room air Temp:     98.0 degrees F (36.67 degrees C) oral Pulse rate:   57 / minute BP sitting:   128 / 78  (left arm) Cuff size:   regular  Vitals Entered By: Brenton Grills MA (March 09, 2010 2:05 PM)  O2 Flow:  Room air CC: 6 week f/u/aj   Referring Provider:  Vinnie Level Primary Provider:  Jacques Navy MD  CC:  6 week f/u/aj.  History of Present Illness: hyperthyroidism:  pt is now 4 1/2 months s/p i-131 rx.  shefeels nor different, and well in general.   she saw dr Myrtis Ser, who advised continuing same atenolol and diltiazem.    Current Medications (verified): 1)  Atenolol 25 Mg Tabs (Atenolol) .... Take 1 Tablet By Mouth Two Times A Day 2)  Cardizem Cd 240 Mg Cp24 (Diltiazem Hcl Coated Beads) .... Take 1 Tablet By Mouth Once A Day 3)  Hydrochlorothiazide 25 Mg Tabs (Hydrochlorothiazide) .... Take 1 Tablet By Mouth Once A Day 4)  Angeliq 0.5-1 Mg Tabs (Drospirenone-Estradiol) .... Take 1 Tablet By Mouth Once A Day 5)  Calcium 500/d 500-200 Mg-Unit Tabs (Calcium Carbonate-Vitamin D) .... 2 Tablet Daily 6)  Multivitamins   Tabs (Multiple Vitamin) .Marland Kitchen.. 1 Once Daily 7)  Celebrex 200 Mg Caps (Celecoxib) .... Prn 8)  Nexium 40 Mg Cpdr (Esomeprazole Magnesium) .... Take 1 Tablet By Mouth Once A Day 9)  Vitamin D 1000 Unit Tabs (Cholecalciferol) .... Take One Tablet By Mouth Once Daily.  Allergies (verified): 1)  ! Penicillin 2)  ! Demerol 3)  ! * Oxycodone 4)  ! Percocet 5)  ! Darvocet 6)  ! Ultram 7)  ! Morphine  Past History:  Past Medical History: Last updated: 02/18/2010 LBBB Lung cancer... Right non-small cell, adenocarcinoma with broncho-alveolar features diagnosed January, 2010 Hypertension Melanoma - left leg hyperthyroidism.... Dr.Donielle Kaigler...  radioactive iodine treatment.. 2011... resolved Hand tremor.... intention tremor   Physician Roster:              Oncologist - dr. Arbutus Ped              Surgeon    - Dr. Jerene Canny -  Dr. Estrellita Ludwig - Dr. Terri Piedra    Review of Systems  The patient denies weight loss and weight gain.    Physical Exam  General:  normal appearance.   Neck:  the thyroid is several times normal size, with multiple nodules palpable. Additional Exam:  FastTSH                   1.68 uIU/mL     Impression & Recommendations:  Problem # 1:  HYPERTHYROIDISM (ICD-242.90) resolved with i-131 rx  Problem # 2:  GOITER, MULTINODULAR (ICD-241.1) Assessment: Unchanged  Other Orders: TLB-TSH (Thyroid Stimulating Hormone) (84443-TSH) Est. Patient Level III (13244)  Patient Instructions: 1)  blood tests are being ordered for you today.  please call [phone] to hear your test results. 2)  Please schedule a follow-up appointment in 2 months.  3)  (update: i left message on phone-tree:  rx as we discussed)

## 2010-08-10 NOTE — Progress Notes (Signed)
Summary: RESULTS  Phone Note Call from Patient Call back at [facility] Phone [phone]   Summary of Call: Patient is requesting results of u/s.  Initial call taken by: Lamar Sprinkles, CMA,  September 11, 2009 5:21 PM  Follow-up for Phone Call        Dr Debby Bud is not available at this time;  the U/S does show increased size of one nodule on the left thyroid, and several other smaller nodules;  it appears the left nodule is large enough to warrant further evaluation ;  I will go ahead and refer to Dr Everardo All in Dr Alvera Novel abscence;  it appears he has signed off on the results but did not append disposition of the result Follow-up by: Corwin Levins MD,  September 11, 2009 5:29 PM  Additional Follow-up for Phone Call Additional follow up Details #1::        Pt informed  Additional Follow-up by: Lamar Sprinkles, CMA,  September 11, 2009 5:52 PM  New Problems: THYROID NODULE (ICD-241.0)   New Problems: THYROID NODULE (ICD-241.0)

## 2010-08-10 NOTE — Assessment & Plan Note (Signed)
Summary: NEW ENDO / THYROID NODULE / MEN PT / DR Jonny Ruiz REFERRED/MEDICAR...   Vital Signs:  Patient profile:   70 year old [name] Height:      65 inches (165.10 cm) Weight:      147.50 pounds (67.05 kg) O2 Sat:      96 % on Room air Temp:     98.3 degrees F (36.83 degrees C) oral Pulse rate:   72 / minute BP sitting:   162 / 70  (left arm) Cuff size:   regular  Vitals Entered By: Josph Macho RMA (September 25, 2009 1:59 PM)  O2 Flow:  Room air CC: New Endo: Thyroid Nodule/ CF   Referring Provider:  [name] MD Primary Provider:  norins  CC:  New Endo: Thyroid Nodule/ CF.  History of Present Illness: pt was incidentally noted on ct in 2006 to have a goiter.  bx was benign.  f/u ultrasound was done a few weeks ago.   symptomatically, pt states few years of tremor of the hands, now increased to moderate intensity.    Current Medications (verified): 1)  Atenolol 25 Mg Tabs (Atenolol) .... Take 1 Tablet By Mouth Two Times A Day 2)  Cardizem Cd 240 Mg Cp24 (Diltiazem Hcl Coated Beads) .... Take 1 Tablet By Mouth Once A Day 3)  Hydrochlorothiazide 25 Mg Tabs (Hydrochlorothiazide) .... Take 1 Tablet By Mouth Once A Day 4)  Angeliq 0.5-1 Mg Tabs (Drospirenone-Estradiol) .... Take 1 Tablet By Mouth Once A Day 5)  Calcium 500/d 500-200 Mg-Unit Tabs (Calcium Carbonate-Vitamin D) .... 2 Tablet Daily 6)  Multivitamins   Tabs (Multiple Vitamin) .Marland Kitchen.. 1 Once Daily 7)  Glucosamine-Chondroitin   Caps (Glucosamine-Chondroit-Vit C-Mn) .Marland Kitchen.. 1 Once Daily 8)  Celebrex 200 Mg Caps (Celecoxib) .... Prn 9)  Nexium 40 Mg Cpdr (Esomeprazole Magnesium) .... Take 1 Tablet By Mouth Once A Day 10)  Vitamin D 1000 Unit Tabs (Cholecalciferol) .... Take One Tablet By Mouth Once Daily.  Allergies (verified): 1)  ! Penicillin 2)  ! Demerol 3)  ! * Oxycodone 4)  ! Percocet 5)  ! Darvocet 6)  ! Ultram  Past History:  Past Medical History: Last updated: 09/02/2009 LBBB Lung cancer... Right non-small  cell, adenocarcinoma with broncho-alveolar features diagnosed January, 2010 Hypertension Melanoma - left leg    Physician Roster:              Oncologist - dr. Arbutus Ped              Surgeon    - Dr. Jerene Canny -  Dr. Estrellita Ludwig - Dr. Terri Piedra    Family History: mother- deceased @75 :heart disease, asthma father - deceased @97 : died secondary to leg fracture mat grandfather-lung CA mat uncle-lung CA Neg - colon or breast cancer; DM; CAD dtr has hypothyroidism.  Social History: Reviewed history from 09/02/2009 and no changes required. [employer] graduate Married - '63 1 son -'40; 1 daughter '67; 4 grandchildren and 2 step-grands Homemaker Patient states former smoker. Quit smoking 33 years ago.  Smoked x 15 years 1+ppd. Marriage in good health   Review of Systems       The patient complains of weight gain.  The patient denies fever.         denies hoarseness, double vision, polyuria, myalgias, excessive diaphoresis, numbness, anxiety, bruising, and rhinorrhea.  she  has increased bowel frequency, doe, fatigue, palpitations, and medopausal sxs.  she has no change in chronic headache.  Physical Exam  General:  normal appearance.   Head:  head: no deformity eyes: no periorbital swelling, no proptosis external nose and ears are normal mouth: no lesion seen Neck:  the thyroid is several times normal size, with multinodular surface. Lungs:  breath sounds are slightly increased on the right (where she had her lobectomy) Heart:  Regular rate and rhythm without murmurs or gallops noted. Normal S1,S2.   Msk:  muscle bulk and strength are grossly normal.  no obvious joint swelling.  gait is normal and steady  Extremities:  no deformity no edema Neurologic:  cn 2-12 grossly intact.   readily moves all 4's.   sensation is intact to touch on all 4's there is a moderate postural tremor of the hands Skin:  normal texture and temp.  no rash.  not  diaphoretic  Cervical Nodes:  No significant adenopathy.  Psych:  Alert and cooperative; normal mood and affect; normal attention span and concentration.   Additional Exam:  THYROID ULTRASOUND The dominant nodule in the mid lower left lobe has increased in size as described above.  Multiple nodules again are noted scattered throughout both lobes with a new solid nodule in the isthmus.  FastTSH              [L]  0.07 uIU/mL   Impression & Recommendations:  Problem # 1:  GOITER, MULTINODULAR (ICD-241.1) Assessment Deteriorated  Problem # 2:  HYPERTHYROIDISM (ICD-242.90) prob due to #1  Problem # 3:  * LUNG CANCER JANUARY 2 010 this and its treatment can limit interpretation of sxs  Other Orders: Radiology Referral (Radiology) Consultation Level IV (16109)  Patient Instructions: 1)  check thyroid x ray.  you will be called with a day and time for an appointment. 2)  tests are being ordered for you today.  a few days after the test(s), please call [phone] to hear your test results. 3)  tentative plan will be for the thyroid iodine radioactive treatment.   4)  please plan to return here approx 6 weeks after that.

## 2010-08-10 NOTE — Assessment & Plan Note (Signed)
Summary: 6 wk rov /nws  #   Vital Signs:  Patient profile:   70 year old female Height:      65 inches (165.10 cm) Weight:      149 pounds (67.73 kg) BMI:     24.88 O2 Sat:      95 % on Room air Temp:     98.3 degrees F (36.83 degrees C) oral Pulse rate:   61 / minute BP sitting:   118 / 68  (left arm) Cuff size:   regular  Vitals Entered By: Brenton Grills MA (January 25, 2010 1:21 PM)  O2 Flow:  Room air CC: 6 wk ROV/aj   Referring Provider:  Vinnie Level Primary Provider:  Jacques Navy MD  CC:  6 wk ROV/aj.  History of Present Illness: the status of at least 3 ongoing medical problems is addressed today: hyperthyroidism:  pt is now 3 months s/p i-131 rx.  she has fatigue. multinodular goiter:  she does not notice. tremor:  persists   hypertension:  pt says she tolerates her meds well.  Current Medications (verified): 1)  Atenolol 25 Mg Tabs (Atenolol) .... Take 1 Tablet By Mouth Two Times A Day 2)  Cardizem Cd 240 Mg Cp24 (Diltiazem Hcl Coated Beads) .... Take 1 Tablet By Mouth Once A Day 3)  Hydrochlorothiazide 25 Mg Tabs (Hydrochlorothiazide) .... Take 1 Tablet By Mouth Once A Day 4)  Angeliq 0.5-1 Mg Tabs (Drospirenone-Estradiol) .... Take 1 Tablet By Mouth Once A Day 5)  Calcium 500/d 500-200 Mg-Unit Tabs (Calcium Carbonate-Vitamin D) .... 2 Tablet Daily 6)  Multivitamins   Tabs (Multiple Vitamin) .Marland Kitchen.. 1 Once Daily 7)  Glucosamine-Chondroitin   Caps (Glucosamine-Chondroit-Vit C-Mn) .Marland Kitchen.. 1 Once Daily 8)  Celebrex 200 Mg Caps (Celecoxib) .... Prn 9)  Nexium 40 Mg Cpdr (Esomeprazole Magnesium) .... Take 1 Tablet By Mouth Once A Day 10)  Vitamin D 1000 Unit Tabs (Cholecalciferol) .... Take One Tablet By Mouth Once Daily.  Allergies (verified): 1)  ! Penicillin 2)  ! Demerol 3)  ! * Oxycodone 4)  ! Percocet 5)  ! Darvocet 6)  ! Ultram 7)  ! Morphine  Past History:  Past Medical History: Last updated: 09/02/2009 LBBB Lung cancer...  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 Arbutus Ped              Surgeon    - Dr. Jerene Canny -  Dr. Estrellita Ludwig - Dr. Terri Piedra    Review of Systems  The patient denies fever.         no change in slight doe  Physical Exam  General:  normal appearance.   Neck:  the thyroid is several times normal size, with multinodular surface. Additional Exam:  FastTSH                   1.62 uIU/mL                 0.35-5.50 Free T4                   0.67 ng/dL      Impression & Recommendations:  Problem # 1:  GOITER, MULTINODULAR (ICD-241.1) Assessment Unchanged  Problem # 2:  HYPERTHYROIDISM (ICD-242.90) resolved with i-131 rx  Problem # 3:  ESSENTIAL HYPERTENSION, BENIGN (ICD-401.1) improvement in her hyperthyroidism may unmask bradycardia.  Problem # 4:  TREMOR (ICD-781.0) it persists despite normalization of her tft  Other Orders: TLB-TSH (Thyroid Stimulating Hormone) (84443-TSH) TLB-T4 (Thyrox), Free [phone]) Est. Patient Level IV (19147)  Patient Instructions: 1)  blood tests are being ordered for you today.  please call [phone] to hear your test results. 2)  Please schedule a follow-up appointment in 6 weeks. 3)  we may be able to reduce cardizem. 4)  (update: i left message on phone-tree:  thyroid is normal.  if you want to see the neurologist for tremor, let me know.)

## 2010-08-12 ENCOUNTER — Ambulatory Visit (HOSPITAL_BASED_OUTPATIENT_CLINIC_OR_DEPARTMENT_OTHER): Payer: Medicare Other | Admitting: Internal Medicine

## 2010-08-12 ENCOUNTER — Encounter: Payer: Self-pay | Admitting: Endocrinology

## 2010-08-12 ENCOUNTER — Encounter: Payer: Self-pay | Admitting: Cardiology

## 2010-08-12 ENCOUNTER — Other Ambulatory Visit: Payer: Self-pay | Admitting: Internal Medicine

## 2010-08-12 DIAGNOSIS — C439 Malignant melanoma of skin, unspecified: Secondary | ICD-10-CM

## 2010-08-12 DIAGNOSIS — C3410 Malignant neoplasm of upper lobe, unspecified bronchus or lung: Secondary | ICD-10-CM

## 2010-08-12 DIAGNOSIS — C3490 Malignant neoplasm of unspecified part of unspecified bronchus or lung: Secondary | ICD-10-CM

## 2010-08-18 NOTE — Letter (Addendum)
Summary: [facility]  [facility]   Imported By: Sherian Rein 08/13/2010 07:53:21  _____________________________________________________________________  External Attachment:    Type:   Image     Comment:   External Document

## 2010-08-31 ENCOUNTER — Telehealth: Payer: Self-pay | Admitting: Internal Medicine

## 2010-09-01 NOTE — Letter (Signed)
Summary: [facility] MD/[facility]  [facility] MD/[facility]   Imported By: Lester [place] 08/25/2010 09:44:25  _____________________________________________________________________  External Attachment:    Type:   Image     Comment:   External Document

## 2010-09-01 NOTE — Letter (Signed)
Summary: [facility]: Office Progress Note  [facility]: Office Progress Note   Imported By: Earl Many 08/25/2010 08:57:31  _____________________________________________________________________  External Attachment:    Type:   Image     Comment:   External Document

## 2010-09-07 ENCOUNTER — Other Ambulatory Visit: Payer: Medicare Other

## 2010-09-07 ENCOUNTER — Ambulatory Visit (INDEPENDENT_AMBULATORY_CARE_PROVIDER_SITE_OTHER): Payer: Medicare Other | Admitting: Endocrinology

## 2010-09-07 ENCOUNTER — Encounter: Payer: Self-pay | Admitting: Endocrinology

## 2010-09-07 ENCOUNTER — Other Ambulatory Visit: Payer: Self-pay | Admitting: Endocrinology

## 2010-09-07 DIAGNOSIS — E042 Nontoxic multinodular goiter: Secondary | ICD-10-CM

## 2010-09-07 DIAGNOSIS — E0590 Thyrotoxicosis, unspecified without thyrotoxic crisis or storm: Secondary | ICD-10-CM

## 2010-09-07 LAB — TSH: TSH: 2.85 u[IU]/mL (ref 0.35–5.50)

## 2010-09-07 NOTE — Progress Notes (Signed)
    Immunization History:  Zostavax History:    Zostavax # 1:  zostavax (08/28/2010)  Zostavax (to be given today)

## 2010-09-16 NOTE — Assessment & Plan Note (Signed)
Summary: 4 MTH FU-STC   Vital Signs:  Patient profile:   70 year old female Height:      65 inches (165.10 cm) Weight:      151 pounds (68.64 kg) BMI:     25.22 O2 Sat:      95 % on Room air Temp:     98.0 degrees F (36.67 degrees C) oral Pulse rate:   63 / minute Resp:     14 per minute BP sitting:   116 / 68  (left arm) Cuff size:   regular  Vitals Entered By: Burnard Leigh CMA(AAMA) (September 07, 2010 2:09 PM)  O2 Flow:  Room air CC: 77-month F/U Hyperthyroidism/sls,cma   Referring Provider:  Vinnie Level Primary Provider:  Jacques Navy MD  CC:  19-month F/U Hyperthyroidism/sls and cma.  History of Present Illness: hyperthyroidism:  pt is now 10 1/2 months s/p i-131 rx, for hyperthyroidism, due to multinodular goiter.  pt states she feels well in general, except for heartburn.  nexium helps.     Current Medications (verified): 1)  Atenolol 25 Mg Tabs (Atenolol) .... Take 1 Tablet By Mouth Two Times A Day 2)  Cardizem Cd 240 Mg Cp24 (Diltiazem Hcl Coated Beads) .... Take 1 Tablet By Mouth Once A Day 3)  Hydrochlorothiazide 25 Mg Tabs (Hydrochlorothiazide) .... Take 1 Tablet By Mouth Once A Day 4)  Angeliq 0.5-1 Mg Tabs (Drospirenone-Estradiol) .... Take 1 Tablet By Mouth Once A Day 5)  Calcium 500/d 500-200 Mg-Unit Tabs (Calcium Carbonate-Vitamin D) .... 2 Tablet Daily 6)  Multivitamins   Tabs (Multiple Vitamin) .Marland Kitchen.. 1 Once Daily 7)  Celebrex 200 Mg Caps (Celecoxib) .... Prn 8)  Nexium 40 Mg Cpdr (Esomeprazole Magnesium) .... Take 1 Tablet By Mouth Once A Day 9)  Vitamin D 1000 Unit Tabs (Cholecalciferol) .... Take One Tablet By Mouth Once Daily.  Allergies (verified): 1)  ! Penicillin 2)  ! Demerol 3)  ! * Oxycodone 4)  ! Percocet 5)  ! Darvocet 6)  ! Ultram 7)  ! Morphine  Past History:  Past Medical History: Last updated: 02/18/2010 LBBB Lung cancer... Right non-small cell, adenocarcinoma with broncho-alveolar features diagnosed  January, 2010 Hypertension Melanoma - left leg hyperthyroidism.... Dr.Jasher Barkan... radioactive iodine treatment.. 2011... resolved Hand tremor.... intention tremor   Physician Roster:              Oncologist - dr. Arbutus Ped              Surgeon    - Dr. Jerene Canny -  Dr. Estrellita Ludwig - Dr. Terri Piedra    Review of Systems  The patient denies weight loss and weight gain.    Physical Exam  General:  normal appearance.   Neck:  the thyroid is normal size, but several nodules palpable, right > left. Additional Exam:  FastTSH                   2.85 uIU/mL     Impression & Recommendations:  Problem # 1:  HYPERTHYROIDISM (ICD-242.90) resolved with i-131 rx  Problem # 2:  GOITER, MULTINODULAR (ICD-241.1) Assessment: Improved  Other Orders: TLB-TSH (Thyroid Stimulating Hormone) (84443-TSH) Est. Patient Level III (16109)  Patient Instructions: 1)  blood tests are being ordered for you today.  please call [phone] to hear your test results. 2)  Please schedule a follow-up appointment in 6 months. 3)  a "lumpy thyroid" may eventually become overactive again.  this is usually a slow process, happening over the span of many years.  however, it occasionally happens faster.     Orders Added: 1)  TLB-TSH (Thyroid Stimulating Hormone) [84443-TSH] 2)  Est. Patient Level III [29518]

## 2010-09-21 NOTE — Progress Notes (Signed)
Summary: [facility]: Office Visit  [facility]: Office Visit   Imported By: Earl Many 09/13/2010 13:00:11  _____________________________________________________________________  External Attachment:    Type:   Image     Comment:   External Document

## 2010-10-05 ENCOUNTER — Other Ambulatory Visit: Payer: Self-pay | Admitting: Surgery

## 2010-10-26 ENCOUNTER — Other Ambulatory Visit: Payer: Self-pay | Admitting: Cardiology

## 2010-10-26 LAB — BASIC METABOLIC PANEL
BUN: 10 mg/dL (ref 6–23)
BUN: 7 mg/dL (ref 6–23)
CO2: 27 mEq/L (ref 19–32)
Calcium: 8 mg/dL — ABNORMAL LOW (ref 8.4–10.5)
Calcium: 8.7 mg/dL (ref 8.4–10.5)
Calcium: 8.9 mg/dL (ref 8.4–10.5)
Creatinine, Ser: 0.71 mg/dL (ref 0.4–1.2)
Creatinine, Ser: 0.74 mg/dL (ref 0.4–1.2)
Creatinine, Ser: 0.73 mg/dL (ref 0.4–1.2)
GFR calc Af Amer: >60 mL/min (ref >60)
GFR calc non Af Amer: >60 mL/min (ref >60)
GFR calc non Af Amer: >60 mL/min (ref >60)
Glucose, Bld: 113 mg/dL — ABNORMAL HIGH (ref 70–99)
Glucose, Bld: 152 mg/dL — ABNORMAL HIGH (ref 70–99)
Glucose, Bld: 96 mg/dL (ref 70–99)
Potassium: 3.9 mEq/L (ref 3.5–5.1)

## 2010-10-26 LAB — CBC
HCT: 28.1 % — ABNORMAL LOW (ref 36.0–46.0)
HCT: 30.7 % — ABNORMAL LOW (ref 36.0–46.0)
HCT: 43.6 % (ref 36.0–46.0)
HCT: 28.5 % — ABNORMAL LOW (ref 36.0–46.0)
Hemoglobin: 10.7 g/dL — ABNORMAL LOW (ref 12.0–15.0)
Hemoglobin: 9.6 g/dL — ABNORMAL LOW (ref 12.0–15.0)
Hemoglobin: 9.7 g/dL — ABNORMAL LOW (ref 12.0–15.0)
MCHC: 34.5 g/dL (ref 30.0–36.0)
MCV: 91.4 fL (ref 78.0–100.0)
MCV: 91.6 fL (ref 78.0–100.0)
Platelets: 165 10*3/uL (ref 150–400)
Platelets: 173 10*3/uL (ref 150–400)
Platelets: 209 10*3/uL (ref 150–400)
Platelets: 212 10*3/uL (ref 150–400)
RBC: 3.07 MIL/uL — ABNORMAL LOW (ref 3.87–5.11)
RDW: 12.7 % (ref 11.5–15.5)
RDW: 13.3 % (ref 11.5–15.5)
RDW: 13.3 % (ref 11.5–15.5)
RDW: 12.8 % (ref 11.5–15.5)
WBC: 12.1 10*3/uL — ABNORMAL HIGH (ref 4.0–10.5)
WBC: 9.4 10*3/uL (ref 4.0–10.5)
WBC: 9.8 10*3/uL (ref 4.0–10.5)

## 2010-10-26 LAB — COMPREHENSIVE METABOLIC PANEL
ALT: 15 U/L (ref 0–35)
AST: 21 U/L (ref 0–37)
Albumin: 2.7 g/dL — ABNORMAL LOW (ref 3.5–5.2)
Albumin: 3.8 g/dL (ref 3.5–5.2)
Alkaline Phosphatase: 31 U/L — ABNORMAL LOW (ref 39–117)
Alkaline Phosphatase: 52 U/L (ref 39–117)
BUN: 14 mg/dL (ref 6–23)
Chloride: 103 mEq/L (ref 96–112)
GFR calc Af Amer: >60 mL/min (ref >60)
Glucose, Bld: 113 mg/dL — ABNORMAL HIGH (ref 70–99)
Potassium: 3.7 mEq/L (ref 3.5–5.1)
Potassium: 3.7 mEq/L (ref 3.5–5.1)
Sodium: 132 mEq/L — ABNORMAL LOW (ref 135–145)
Total Bilirubin: 0.9 mg/dL (ref 0.3–1.2)
Total Protein: 4.6 g/dL — ABNORMAL LOW (ref 6.0–8.3)

## 2010-10-26 LAB — TYPE AND SCREEN
ABO/RH(D): B NEG
Antibody Screen: NEGATIVE

## 2010-10-26 LAB — URINALYSIS, ROUTINE W REFLEX MICROSCOPIC
Hgb urine dipstick: NEGATIVE
Nitrite: NEGATIVE
Specific Gravity, Urine: 1.009 (ref 1.005–1.030)
Urobilinogen, UA: 0.2 mg/dL (ref 0.0–1.0)
pH: 6.5 (ref 5.0–8.0)

## 2010-10-26 LAB — BLOOD GAS, ARTERIAL
TCO2: 27.1 mmol/L (ref 0–100)
pCO2 arterial: 36.4 mmHg (ref 35.0–45.0)
pH, Arterial: 7.467 — ABNORMAL HIGH (ref 7.350–7.400)
pO2, Arterial: 95.9 mmHg (ref 80.0–100.0)

## 2010-10-26 LAB — POCT I-STAT 3, ART BLOOD GAS (G3+)
Acid-Base Excess: 1 mmol/L (ref 0.0–2.0)
pCO2 arterial: 34 mmHg — ABNORMAL LOW (ref 35.0–45.0)
pH, Arterial: 7.459 — ABNORMAL HIGH (ref 7.350–7.400)
pO2, Arterial: 92 mmHg (ref 80.0–100.0)

## 2010-10-26 LAB — ABO/RH: ABO/RH(D): B NEG

## 2010-11-01 ENCOUNTER — Other Ambulatory Visit: Payer: Self-pay | Admitting: Cardiology

## 2010-11-05 ENCOUNTER — Other Ambulatory Visit: Payer: Self-pay | Admitting: *Deleted

## 2010-11-05 MED ORDER — DILTIAZEM HCL ER COATED BEADS 240 MG PO CP24: 240.0000 mg | ORAL_CAPSULE | Freq: Every day | ORAL | Status: DC

## 2010-11-12 ENCOUNTER — Other Ambulatory Visit: Payer: Self-pay | Admitting: Cardiology

## 2010-11-23 NOTE — Assessment & Plan Note (Signed)
[facility]                            CARDIOLOGY OFFICE NOTE   NAME:Grudzien, LAEKYN RAYOS                       MRN:          [id number]  DATE:06/30/2008                            DOB:          [birth year]    Ms. [name] is here for yearly followup.  Fortunately, she has done  extremely well.  I have followed her for many, many years.  We diagnosed  left bundle-branch block many years ago.  She has an ejection fraction  that has been in the 50% range.  We know that she does not have any  other significant problems.  She has had hypertension.  She has had  difficulty with various meds, and the current medicines that we have her  on have been stable for a long time.  I would not inclined to change  them.   PAST MEDICAL HISTORY:  Allergies.   MEDICATIONS:  Cardizem, hydrochlorothiazide, calcium, hormone, atenolol,  omeprazole.   OTHER MEDICAL PROBLEMS:  See the list on my note of June 27, 2007.   REVIEW OF SYSTEMS:  She is doing well.  She has no GI or GU symptoms.  She has no headaches, fevers, or chills.  Her review of systems is  negative.   PHYSICAL EXAMINATION:  Weight is 150 pounds.  Blood pressure is 136/74  with a pulse of 62.  The patient is oriented to person, time, and place.  Affect is normal.  HEENT reveals no xanthelasma.  She has normal  extraocular motion.  There are no carotid bruits.  There is no jugular  venous distention.  Lungs are clear.  Respiratory effort is not labored.  Cardiac exam reveals an S1 with an S2.  There are no clicks or  significant murmurs.  The abdomen is soft.  She has no peripheral edema.   EKG reveals old left bundle-branch block.   Problems are listed on the note of June 27, 2007.  1. Hypertension.  Stable, well treated.  2. History of soft systolic murmur.  No further workup is needed.  3. Poorly defined density in both lungs in the past.  She has been in      touch with Dr. Teddy Spike office recently.   I discussed with her that      I think that it is a followup CT that she needs to be sure that      there was not question that she needed an MRI.  This will be      handled through the patient and Dr. Shelle Iron.   She is stable.  No further workup is needed.     Luis Abed, MD, [facility]  Electronically Signed    JDK/MedQ  DD: 06/30/2008  DT: 07/01/2008  Job #: [id number]   cc:   S. Kyra Manges, M.D.  Rosalyn Gess Norins, MD

## 2010-11-23 NOTE — Assessment & Plan Note (Signed)
OFFICE VISIT   [name], PADDOCK  DOB:  Jul 24, 1940                                        July 28, 2009  CHART #:  [id number]   The patient came for followup today.  Her CT scan showed changes in the  right lung hilum and multiple bilateral nodular opacities that have  unchanged.  She is doing well overall with no shortness of breath.  She  is being followed by Dr. Arbutus Ped.  We will get another CT scan in 6  months.  Her blood pressure was 134/64, pulse 72, respirations 18, and  sats were 95%.   [name], M.D.  Electronically Signed   DPB/MEDQ  D:  07/28/2009  T:  07/29/2009  Job:  [id number]

## 2010-11-23 NOTE — Op Note (Signed)
[name], [name]                ACCOUNT NO.:  [id number]   MEDICAL RECORD NO.:  [id number]          PATIENT TYPE:  INP   LOCATION:  2309                         FACILITY:  [facility]   PHYSICIAN:  [name], M.D. DATE OF BIRTH:  11-02-1940   DATE OF PROCEDURE:  08/21/2008  DATE OF DISCHARGE:                               OPERATIVE REPORT   PREOPERATIVE DIAGNOSES:  1. Non-small cell lung cancer.  2. Adenocarcinoma, right upper lobe with peripheral lesions.   POSTOPERATIVE DIAGNOSES:  1. Non-small cell lung cancer.  2. Adenocarcinoma, right upper lobe with peripheral lesions.   OPERATION PERFORMED:  Right video-assisted thoracic surgery,  minithoracotomy and right upper lobectomy with node dissection.   SURGEON:  [name], MD   ANESTHESIA:  General anesthesia.   After percutaneous insertion of all monitor lines, the patient underwent  general anesthesia, was prepped and draped in the usual sterile manner,  was turned to the right lateral thoracotomy position.  Two trocar sites  were made in the anterior and posterior axillary line at the seventh  intercostal space.  Two trocars were inserted.  A 0-degree scope was  inserted.  The patient had minimal anthracosis.  She has been followed  for several years with problems with multiple lung nodules and then had  a 2-year hiatus from CT scans and then there was a new larger nodule,  she still had the several nodules in the right upper lobe as well as in  the left side and of course one in the left lower lobe.  She was brought  to the operating room for resection of the right upper lobe.  A small 7-  cm incision was made over the triangle of auscultation.  The latissimus  was partially divided.  Serratus was reflected anteriorly, we decided to  use posterior approach to better palpate the multiple lesions in the  lung.  We palpated the right lower lobe and right middle lobe and fully  felt no other lesion.  There was one  lesion in the fissure that I  thought was in the lower lobe, but I palpated it more in the right upper  lobe.  Resection was started anteriorly, and we dissected out several 4R  nodes from the mediastinum, two and then  looped a posterior branch with  vascular tape and stapled and divided it with an Auto Suture 2-mm  stapler, and the superior pulmonary vein was dissected out, stapled and  divided with the Auto Suture, staplers preserving the branch to the  right middle lobe.  This exposed a small anterior branch, which was  doubly clipped and divided.  Finally attention was turned to the minor  fissure and first the minor fissure was dissected out at its base and  then the minor fissure was divided with an Auto Suture 3.5-mm stapler  with 2 applications.  This exposed the posterior branch and it was  stapled and divided with a Auto Suture 2-mm stapler.  Finally, the  superior portion of the major fissure was divided with a Auto Suture 3.5  stapler.  Several 11L  nodes and more 10R nodes were dissected free from  around all the bronchi .  We removed all the nodes that could be seen  and stapled the bronchus with a TA-30 and divided distally.  We sent the  lesion for frozen section and the large lesion was adenocarcinoma and  bronchial margins were negative.  However, the other lesions were  definitely abnormal with lymphoid infiltration and questionable  adenocarcinoma in situ.  So, we will await for permanent sections on  these at least two other lesions that could be palpated.  Two chest  tubes were brought into the trocar sites and tied in place with 0 silk.  A single On-Q was inserted in the usual fashion.  Marcaine block was  done in the usual fashion.  CoSeal was applied to the staple line and 4  pericostals were passed around the fifth and going through the sixth rib  where the hole had been drilled and then passed around the superior  portion of the fifth.  The lung was reexpanded.   The lung was tied.  The  pericostals were closed.  Chest was closed with 3-0 Vicryl and  subcutaneous tissue and the muscle layer have been closed with #1  Vicryl.  Dermabond for the skin.  The patient returned to the recovery  room in stable condition.      [name], M.D.  Electronically Signed     DPB/MEDQ  D:  08/21/2008  T:  08/22/2008  Job:  [id number]   cc:   Barbaraann Share, MD,FCCP

## 2010-11-23 NOTE — Letter (Signed)
September 17, 2008   Lajuana Matte, MD  [phone]  [place], [place] [address]   Re:  [name], [name]                DOB:  06-11-1941   Dear [name]:   I saw the patient today.  Her chest x-ray showed further improvement.  Her only problem is when she has been having a lot of refluxes or  burning and is eating more poorly.  She was on Prilosec and we switched  her to Nexium 40 mg twice a day.  She has had some trouble with  constipation, but this is improving.  Overall, she is making slow  progress.  She has had some numbness in her right breast secondary to  the nerves.  Her blood pressure was 139/83, pulse 93, respirations 18,  and sats were 97%.  Unfortunately her molecular  was not favorable for  Tarceva, so I agree with your plan to just follow her closely and not  consider any treatment until we are sure that some of these other  nodules are actually cancer, and I explained to her that this is  probably a very long slow growing process.  I will see her back again in  6 weeks with another chest x-ray.   Sincerely,   [name], M.D.  Electronically Signed   DPB/MEDQ  D:  09/17/2008  T:  09/18/2008  Job:  [id number]   cc:   Barbaraann Share, MD,FCCP

## 2010-11-23 NOTE — Discharge Summary (Signed)
NAMEMARQUITTA, [name]                ACCOUNT NO.:  [id number]   MEDICAL RECORD NO.:  [id number]          PATIENT TYPE:  INP   LOCATION:  2010                         FACILITY:  [facility]   PHYSICIAN:  [name], M.D. DATE OF BIRTH:  09-26-1940   DATE OF ADMISSION:  08/21/2008  DATE OF DISCHARGE:                               DISCHARGE SUMMARY   FINAL DIAGNOSIS:  Right upper lobe mass, adenocarcinoma, T1a N0 Mx.   SECONDARY DIAGNOSES:  1. Hypertension.  2. Gastroesophageal reflux disease.  3. History of left bundle-branch block followed by Dr. Myrtis Ser.  4. History of osteoporosis.   IN-HOSPITAL OPERATIONS AND PROCEDURES:  Right video-assisted  thoracoscopic surgery with right mini thoracotomy, right upper lobectomy  with lymph node dissection.   HISTORY AND PHYSICAL AND HOSPITAL COURSE:  The patient is a 70 year old  female.  She quit smoking in 1977.  Four years ago, after a D and C, she  had an anesthetic reaction.  Chest x-ray done showed some pulmonary  nodules which had been followed and has remained stable on serial CTs.  The patient had a 2-year hiatus from 2008 until now when  a repeat CT  scan showed most of the nodules were stable, but there was a new larger  right upper lobe nodule that was hypermetabolic on PET scan.  Needle  biopsy revealed adenocarcinoma.  The patient was referred to Dr. Edwyna Shell  for further resection.  Pulmonary function tests done showed an FVC 3.01  with an FEV-1 of 2.11 and diffusion capacity of over 100%.  She denies  hemoptysis, fever, chills, or excessive sputum.  The patient was seen  and evaluated by Dr. Edwyna Shell.  Dr. Edwyna Shell discussed with the patient  undergoing right upper lobectomy.  He discussed risks and benefits with  the patient.  The patient acknowledged understanding and agreed to  proceed.  Surgery was scheduled for August 21, 2008.  For details of  the patient's past medical history and physical exam, please see  dictated H and  P.   The patient was taken to the operating room on August 21, 2008, where  she underwent right video-assisted thoracoscopic surgery with right mini  thoracotomy, right upper lobectomy with lymph node dissection.  This  showed positive for adenocarcinoma T1a N0 Mx.  The patient tolerated  this procedure well and was transferred to the Intensive Care Unit in  stable condition.  The patient was noted to be hemodynamically stable.  She was extubated following surgery.  Post-extubation, she was noted to  be alert and oriented x4.  Neuro intact.  Postoperatively, Pulmonary was  consulted and did see the patient on postop day #1.  They felt that the  patient was stable from pulmonary standpoint and increased use of  incentive spirometer and discontinued oxygen as tolerated.  Postoperatively, daily chest x-rays were obtained.  Chest x-rays  remained stable.  The patient did have a small air leak postoperatively.  This resolved by postop day #2.  Posterior chest tube discontinued on  postop day #2 with remaining chest tube switched to Mini Express on  postop  day 3.  Repeat chest x-ray done on postop day #4 was stable with  no air leak noted.  Remaining chest tube discontinued.  Followup AP and  lateral chest x-ray in the a.m. remained stable with no pneumothorax  noted.  During this time, the patient's vital signs monitored.  She  remained afebrile.  She remained in normal sinus rhythm.  She was noted  to have hypertension and her blood pressure was elevated  postoperatively.  She was restarted on her atenolol as well as Cardizem  and lisinopril was added as a second hypertensive agent.  Currently, we  are following her blood pressure.  The patient was able to be weaned off  oxygen with O2 saturations greater than 90%.  Postoperatively, the  patient was up ambulating well without difficulty.  She was tolerating  diet well.  No nausea or vomiting noted prior to discharge.  All  incisions noted  to be clean, dry, and intact and healing well.   On postop day 5, the patient was progressing well.  Most recent lab work  showed a white blood cell count 8.8, hemoglobin 9.7, hematocrit 28.1,  and platelet count 165.  Sodium of 135, potassium 4.1, chloride of 101,  bicarbonate 27, BUN 5, creatinine 0.74, and glucose 113.  The patient is  tentatively ready for discharge home in the a.m. pending she remained  stable and chest x-ray remain stable.   FOLLOWUP APPOINTMENTS:  Followup appointment has been arranged with Dr.  Edwyna Shell for September 03, 2008, at 10:45 a.m..  The patient will need to  obtain AP and lateral chest x-ray 30 minutes prior to this appointment.   ACTIVITY:  The patient instructed no driving until released to do so, no  lifting over 10 pounds.  She is told ambulate 3-4 times per day,  progress as tolerated and continue her breathing exercises.   INCISIONAL CARE:  The patient is told to shower, washing her incisions  using soap water.  She is contact the office if she develops any  drainage or opening from any of her incision sites.   DIET:  The patient educated on diet to be low-fat, low-salt.   DISCHARGE MEDICATIONS:  1. Atenolol 25 mg b.i.d.  2. Cardizem CD 240 mg daily.  3. Omeprazole 20 mg daily.  4. Estradiol 0.5 to 1 mg daily.  5. Omega-3 fish oil 1000 mg b.i.d.  6. Celebrex 200 mg p.r.n.  7. Calcium and vitamin D daily.  8. Multivitamin daily.  9. Glucosamine chondroitin daily.  10.Nu-Iron 150 mg daily.  11.Ultram 50 mg 1-2 tablets q.4-6 h. p.r.n.  12.Lisinopril 5 mg daily.  13.Darvocet-N 100 one to two tabs q.4-6 h p.r.n.       Theda Belfast, [place]      [name], M.D.  Electronically Signed    KMD/MEDQ  D:  08/26/2008  T:  08/27/2008  Job:  [id number]

## 2010-11-23 NOTE — Letter (Signed)
August 13, 2008   Barbaraann Share, MD, [facility]  [address] [place], [place] [address]   Re:  [name], [name]                DOB:  12-04-1940   Dear Mellody Dance,   I appreciate the opportunity of seeing the patient.  This is an  extremely interesting patient.  This 70 year old patient who quit  smoking in 1977.  Approximately 4 years ago after she had a D&C, she had  an anesthetic reaction and chest x-ray showed some pulmonary nodules,  which we would follow with serial CTs have been stable, but after a 2-  year hiatus,  the old nodules appeared to be stable, but there is a new  right upper lobe lesion, which was metabolic on PET scan and turned out  to be adenocarcinoma.  She is referred here for possible resection.  She  has had no hemoptysis, fever, chills, or excessive sputum.   PAST MEDICAL HISTORY:  She does have a left bundle-branch block and is  being followed by Dr. Myrtis Ser for this.  She has allergies.  She has  hypertension and reflux.   MEDICATIONS:  She is on atenolol 25 mg daily, Cardizem CD 240 mg daily,  hydrochlorothiazide 25 mg a day, omeprazole 10 mg daily, and Angeliq 0.5-  1 mg daily, which is an estradiol.   ALLERGIES:  She is allergic to penicillin and Demerol.   FAMILY HISTORY:  Positive for cancer.   SOCIAL HISTORY:  Quit smoking.  She is married, comes in today with her  husband.  She is retired.  She quit smoking 33 years ago.  Does not  drink alcohol on a regular basis.   REVIEW OF SYSTEMS:  Vital Signs:  She is 148 pounds.  She is 5 feet 6  inches.  She has had some weight gain.  Cardiac:  She gets no angina or  atrial fibrillation, but has left bundle-branch block.  Pulmonary:  She  gets shortness of breath on exertion.  No hemoptysis.  GI:  Reflux.  GU:  No kidney disease or dysuria.  Vascular:  No claudication, DVT, or TIAs.  Neurological:  No dizziness, headaches, blackouts, or seizures.  Musculoskeletal:  No arthritis or joint pain.  Psychiatric:   No  psychiatric illness.  Eye/ENT:  No change in eyesight or hearing.  Hematological:  No problems with bleeding, clotting disorders, or  anemia.   PHYSICAL EXAMINATION:  GENERAL:  She is a well-developed Caucasian  female, in no acute distress.  VITAL SIGNS:  Her blood pressure is 172/88, pulse 77, respirations 18,  and sats are 96%.  Pulmonary function test showed an FVC of 3.01 with an  FEV-1 of 0.111 and diffusion capacity of 100% corrected.  HEAD, EYES, EARS, NOSE AND THROAT:  Unremarkable.  NECK:  Supple without thyromegaly.  There is no supraclavicular or  axillary adenopathy.  CHEST:  Clear to auscultation and percussion.  HEART:  Regular sinus rhythm.  No murmurs.  ABDOMEN:  Soft.  There is no hepatosplenomegaly.  EXTREMITIES:  Pulses are 2+.  There is no clubbing or edema.  NEUROLOGICAL:  She is oriented x3.  Sensory and motor intact.  Cranial  nerves intact.   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  She obviously an  adenocarcinoma in the right  upper lobe.  Her pulmonary function test was  satisfactory and I think she could tolerate a VATS lobectomy and so, our  plan is to see resect that in which we can look at not only the primary  lesion, but any other small lesions in the right upper lobe.  We  discussed this with her in great detail.  I will discuss with Dr. Myrtis Ser  as far as her cardiac status.  We will plan to do the surgery on the  August 21, 2008, at the [facility].   [name], M.D.  Electronically Signed   DPB/MEDQ  D:  08/13/2008  T:  08/13/2008  Job:  [id number]   cc:   Luis Abed, MD, [facility]

## 2010-11-23 NOTE — Letter (Signed)
July 27, 2010   Velora Heckler. Arbutus Ped, MD  [address]  [place], [place] [address]   Re:  [name], [name]                DOB:  Jul 07, 1941   Dear Arbutus Ped:   I saw the patient back today and you had already seen and explained  about the possibility of increase on left AP window.  We did a previous  right upper lobectomy with node dissection following the small ground-  glass nodule in the left side.  It somewhat was concerning that the AP  window has gotten bigger which means she may have mediastinal recurrence  of her cancer.  She will get a PET scan and then be following up with  you and I will check with her at that time.  I appreciate the  opportunity of seeing the patient.  Her blood pressure was 155/75, pulse  62, respirations 18, sats were 94%.   [name], M.D.  Electronically Signed   DPB/MEDQ  D:  07/27/2010  T:  07/28/2010  Job:  [id number]

## 2010-11-23 NOTE — Letter (Signed)
September 03, 2008   Barbaraann Share, MD, [facility]  [address] [place], [place] [address]   Re:  [name], [name]                DOB:  October 06, 1940   Dear Mellody Dance,   I saw the patient back today.  She is doing well after a right upper  lobectomy.  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  We presented her at cancer conference and sent the lesion off for  EEG for analysis.  She may be a candidate for Tarceva.  She will be  seeing Dr. Arbutus Ped in the future regarding this.  We removed her chest  tube sutures today.  Her lungs are clear to auscultation and percussion.  Chest x-ray was stable.  She had some drainage from her posterior chest  tube, but that is improved.  We will see her back again in 3 weeks with  the chest x-ray.   [name], M.D.  Electronically Signed   DPB/MEDQ  D:  09/03/2008  T:  06-02-202010  Job:  [id number]   cc:   Lajuana Matte, MD  Rosalyn Gess. Norins, MD

## 2010-11-23 NOTE — Letter (Signed)
January 26, 2010   Lajuana Matte, MD  [phone] [address]  [place], [place] [address]   Re:  SHAJUAN, MUSSO                DOB:  09/10/1940   Dear Arbutus Ped:   I saw the patient in the office today with her and her husband.  Her CT  scan appeared stable except for maybe a slight increase of an AP window  node.  She has a history of having lymphoid hyperplasia in the past.  I  think everything looks stable.  Her blood pressure was 149/79, pulse 63,  respirations 18, and sats were 94%.  I plan to see her back again in 6  months with a CT scan that she has scheduled.   [name], M.D.  Electronically Signed   DPB/MEDQ  D:  01/26/2010  T:  01/27/2010  Job:  [id number]

## 2010-11-23 NOTE — Assessment & Plan Note (Signed)
[facility]                            CARDIOLOGY OFFICE NOTE   NAME:[name], [name]                       MRN:          [id number]  DATE:06/27/2007                            DOB:          [birth year]    SUBJECTIVE:  [name] is doing well.  I had seen her last in December  2007.  I had seen her on a few occasions during 2007, when we were  working with her blood pressure medicines and other issues.  She was  eventually put on a very small dose of Lyrica at 25 mg.  This appears to  have helped many of her post-menopausal symptoms and she has done very  well.  Her medicines are stable.  Her blood pressure is controlled.  She  is not having any significant chest pain or shortness of breath.  She is  going about full activities.   ALLERGIES:  PENICILLIN AND DEMEROL.   CURRENT MEDICATIONS:  1. Hydrochlorothiazide 25 mg.  2. Activella.  3. Vitamins.  4. Angelina (hormone).  5. Atenolol.  6. Lyrica.  7. Omeprazole.   OTHER MEDICAL PROBLEMS:  See the complete list below.   REVIEW OF SYSTEMS:  She has had some mild back pain.  This has not been  limiting.  Otherwise her review of systems is negative.   PHYSICAL EXAMINATION:  GENERAL:  The patient is oriented to person, time  and place.  Affect is normal.  HEENT:  No xanthelasma.  She has normal extraocular motion.  NECK:  She has no carotid bruits.  There is no jugular venous  distention.  LUNGS:  Clear.  Respiratory effort is not labored.  HEART:  An S1 with an S2.  No clicks or significant murmurs.  ABDOMEN:  Soft, no masses or bruits.  EXTREMITIES:  There is no peripheral edema.  MUSCULOSKELETAL:  She has no musculoskeletal deformities.   Electrocardiogram reveals her old left bundle branch block with normal  sinus rhythm.   PROBLEMS:  1. Hypertension:  Over time we have adjusted her medicines, and there      is no need for change at this point.  2. History of soft systolic murmur:  This  is unchanged and there is no      need for any further studies.  3. Old left bundle branch block.  4. History of ejection fraction in the 50% range.  5. Question in the past of a right carotid bruit, but her Dopplers      were normal in 2002, and there is no need to repeat them.  6. Status post tonsillectomy.  7. Status post cervical dysplasia with conization.  8. Poorly-defined density in both lungs, when she saw Dr. Barbaraann Share last.  Her last study was done in July 2007.  She will      contact Dr. Shelle Iron for followup, as this is appropriate at this      time.  9. Single, mildly enlarged right hilar lymph node by history.  10.Mild to moderate chronic obstructive pulmonary disease by CT scan.  11.Multinodular goiter, which she has had in the past, known to Dr.      Rosalyn Gess. Norins.  12.Back pain which is chronic.  13.Insomnia:  This is improved.  14.Weight gain:  She is not overweight, but is very weight conscious.      She has gained a few pounds.   [name] is stable.  There is no need for any further testing at this  point.  We had a long discussion about all of her health care, and she  is doing well.     Luis Abed, MD, [facility]  Electronically Signed    JDK/MedQ  DD: 06/27/2007  DT: 06/27/2007  Job #: [id number]   cc:   Rosalyn Gess. Norins, MD  Barbaraann Share, MD,FCCP  S. Kyra Manges, M.D.

## 2010-11-23 NOTE — Assessment & Plan Note (Signed)
OFFICE VISIT   NECIE, WILCOXSON  DOB:  July 06, 1941                                        December 17, 2008  CHART #:  [id number]   The patient came for followup today.  A CT scan showed that all of her  ground glass nodules are stable.  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  Her mammogram is negative.  Her husband recently had a stent, so she has had a lot of things  happening since we saw her last.  Her blood pressure was 151/76, pulse  80, respirations 18, and sats were 94%.  I plan to see her back again in  3 months after she gets her next CT scan.   [name], M.D.  Electronically Signed   DPB/MEDQ  D:  12/17/2008  T:  12/17/2008  Job:  [id number]

## 2010-11-23 NOTE — Assessment & Plan Note (Signed)
OFFICE VISIT   [name], [name]  DOB:                                                    October 08, 2008  CHART #:  [id number]   Blood pressure was 150/90, pulse 86, respirations 18, and sats were 95%.  Chest x-ray looks good.  She is under observation.  We will get a CT  scan in June, which we will see her back at that time.  Lungs were clear  to auscultation and percussion.  We told her to gradually increase her  activities and which she is doing well, now approximately 6 weeks since  resection.   [name], M.D.  Electronically Signed   DPB/MEDQ  D:  10/08/2008  T:  10/08/2008  Job:  [id number]

## 2010-11-23 NOTE — H&P (Signed)
NAMEFYNLEY, [name]                ACCOUNT NO.:  [id number]   MEDICAL RECORD NO.:  [id number]          PATIENT TYPE:  INP   LOCATION:                               FACILITY:  [facility]   PHYSICIAN:  [name], M.D. DATE OF BIRTH:  1941/04/06   DATE OF ADMISSION:  08/21/2008  DATE OF DISCHARGE:                              HISTORY & PHYSICAL   CHIEF COMPLAINT:  Right upper lobe mass.   HISTORY OF PRESENT ILLNESS:  This 70 year old patient is essentially a  nonsmoker.  She quit smoking in 1977.  Four years ago after D&C, she had  an anesthetic reaction.  A chest x-ray showed some pulmonary nodules,  which we have been followed up which were stable on several CTs.  She  had a 2-year hiatus from 2008 until now when a repeat CT scan showed  most of the nodules were stable, but there was a new larger right upper  lobe nodule that was hypermetabolic on PET and needle biopsy revealed  adenocarcinoma.  She is referred here for possible resection.  Her  pulmonary function tests showed an FVC of 3.01 with an FEV-1 of 2.11 and  diffusion capacity of over 100%.  She has no hemoptysis, fever, chills,  or excessive sputum.  She had multiple nodules on both sides including  right upper lobe are all stable.   PAST MEDICAL HISTORY:  Significant for a left bundle-branch block  followed by Dr. Willa Rough.  She has reflux and hypertension.   ALLERGIES:  PENICILLIN which caused nausea and shortness of breath and  DEMEROL.   MEDICATIONS:  1. Atenolol 25 mg a day.  2. Cardizem 240 mg a day.  3. Hydrochlorothiazide 25 mg a day.  4. Omeprazole 20 mg a day.  5. Angeliq 0.5-1 mg.  6. Estradiol.   FAMILY HISTORY:  Positive for cancer.   SOCIAL HISTORY:  She is married and comes in today with her husband.  She quit smoking 33 years ago.  Does not drink alcohol on a regular  basis.   REVIEW OF SYSTEMS:  VITAL SIGNS:  She is 148 pounds.  She is 5 feet 6  inches.  She has had some recent weight  gain.  CARDIAC:  No angina or  atrial fibrillation.  See past medical history.  PULMONARY:  Shortness  of breath on exertion.  No hemoptysis.  GI:  She has reflux.  GU: No  kidney disease, dysuria, or frequent urination.  VASCULAR:  No  claudication, DVT, or TIAs.  NEUROLOGICAL:  No dizziness, headaches,  blackouts, or seizures.  MUSCULOSKELETAL:  No arthritis or joint pain.  PSYCHIATRIC:  No psychiatric illness.  Eyes/ENT:  No change in eyesight  or hearing.  HEMATOLOGICAL:  No problems with bleeding, clotting  disorders, or anemia.   PHYSICAL EXAMINATION:  GENERAL:  She is a well-developed Caucasian  female, in no acute distress.  VITAL SIGNS:  Her blood pressure was 170/90, pulse 100, respirations 18,  and sats were 96%.  HEENT:  Head is atraumatic.  Eyes, pupils equal and reactive to light  and accommodation.  Extraocular movements normal.  Ears, tympanic  membranes intact.  Nose, there is no septal deviation.  Throat without  lesion.  Uvula is in midline.  NECK:  Supple without thyromegaly.  There is no supraclavicular or  axillary adenopathy.  CHEST:  Clear to auscultation and percussion.  HEART:  Regular sinus rhythm, no murmurs.  ABDOMEN:  Soft.  There is no hepatosplenomegaly.  Bowel sounds are  normal.  EXTREMITIES:  Pulses are 2+.  There is no clubbing or edema.  NEUROLOGICAL:  He is oriented x3.  Sensory and motor intact.  Cranial  nerves intact.   IMPRESSION:  1. 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  2. History of left-bundle branch block.  3. Hypertension.  4. Gastroesophageal reflux disease.   PLAN:  Right VATS lobectomy.      [name], M.D.  Electronically Signed     DPB/MEDQ  D:  08/19/2008  T:  08/20/2008  Job:  [id number]

## 2010-11-23 NOTE — Letter (Signed)
April 01, 2009   Lajuana Matte, MD  [phone]  [place], [place] [address]   Re:  SHAKTI, FLEER                DOB:  [birth year]   Dear Arbutus Ped:   I saw the patient in the office today.  We reviewed her CT scan, which  appears to be stable.  There is a left upper lobe lesion, may be  somewhat more prominent.  I agree with continuing to follow her and the  next CT scan will be in January, and I will see her again at that time.  Her blood pressure was 161/84, pulse 80, respirations 16, and sats were  96%.   Sincerely,   [name], M.D.  Electronically Signed   DPB/MEDQ  D:  04/01/2009  T:  04/02/2009  Job:  [id number]

## 2010-11-23 NOTE — Assessment & Plan Note (Signed)
[facility]                            CARDIOLOGY OFFICE NOTE   NAME:[name], [name]                       MRN:          [id number]  DATE:10/02/2008                            DOB:          [birth year]    [name] is here after her recent lung surgery.  This was evaluated  completely by Dr. Shelle Iron and Dr. Edwyna Shell.  The patient has non-small cell  carcinoma of the lung.  There is also adenocarcinoma of the right upper  lobe with peripheral lesions.  She is not on chemo or radiation at this  time.  These lesions we followed over time with plans to treat at a  later date.  The patient has a very good attitude.  She is moving  forward with her life.  She looks fine at this time.   During her hospitalization, it was noted that her blood pressure was  higher than usual.  A small dose of lisinopril was added.  She is stable  with this today and we will continue this.  She is recovering from her  surgery.  She does have a mild cough.  We do not think this is from the  ACE, but we will keep this in mind.   PAST MEDICAL HISTORY:   ALLERGIES:  See the [facility] flow sheet.   MEDICATIONS:  See the [facility] flow sheet.   OTHER MEDICAL PROBLEMS:  See the prior list.   REVIEW OF SYSTEMS:  She is not having any GI or GU symptoms.  She has no  chest pain.  There are no eye problems.  She has no fevers or chills.  There are no skin lesions.  All other problems were reviewed and are  negative.   PHYSICAL EXAMINATION:  VITAL SIGNS:  Blood pressure today is 150/70,  pulse is 75.  GENERAL:  The patient is oriented to person, time, and place.  Affect is  normal.  HEENT:  No xanthelasma.  She has normal extraocular motion.  NECK:  There are no carotid bruits.  There is no jugular venous  distention.  LUNGS:  Clear.  Respiratory effort is not labored.  CARDIAC:  S1 with an S2.  There are no clicks or significant murmurs.  ABDOMEN:  Soft.  EXTREMITIES:  She has no peripheral  edema.   The EKG reveals old left bundle branch block.   Problems include,  1. Old left bundle branch block.  2. Hypertension.  We will leave her on a very small dose of the ACE      inhibitor.  I will see her back for followup in approximately 8      weeks.  No other change in her medicines at this time.     Luis Abed, MD, [facility]  Electronically Signed    JDK/MedQ  DD: 10/02/2008  DT: 10/03/2008  Job #: [id number]   cc:   Dr. Marlane Mingle, MD  Barbaraann Share, MD,FCCP

## 2010-11-26 NOTE — Assessment & Plan Note (Signed)
[facility]                            CARDIOLOGY OFFICE NOTE   NAME:[name], [name]                       MRN:          [id number]  DATE:06/30/2006                            DOB:          September 12, 1940    Ms. Gullikson is seen for cardiology followup.  She is doing well.  She has  gained a few more pounds.  We have now finally agreed that her activity  level has decreased somewhat, and that she will need to push harder to  watch her calories, she is comfortable with this.  Dr. Elana Alm has  started Lyrica, as he has found that some folks have a good response to  using this medicine with their menopausal symptoms, and she is  significantly improved in this regard.   ALLERGIES:  PENICILLIN, DEMEROL.   MEDICATIONS:  1. Cardizem 240.  2. Hydrochlorothiazide 25.  3. Vitamins.  4. Tenormin 25 b.i.d.  5. Angelina hormone trial.  6. Lyrica 25.  7. Omeprazole 20 mg daily.   REVIEW OF SYSTEMS:  She is feeling well at this time, and has no  significant complaints.   PHYSICAL EXAMINATION:  The blood pressure is 132/80, with a pulse of 64.  The patient is oriented to person, time and place and her affect is  normal.  LUNGS:  Clear.  Respiratory effort is not labored.  HEENT:  Reveals no xanthelasma.  She has normal extraocular motion.  CARDIAC:  Exam reveals an S1 with an S2. There are no clicks or  significant murmurs.  She has no peripheral edema.   Her EKG reveals her old left bundle branch block.   Problems are listed completely on my note of Dec 01, 2005.  Her blood  pressure is stable.  Her symptoms are overall stable.  No further  cardiac workup.  I will see her back in 1 year.  Her hypertension is  well controlled.     Luis Abed, MD, [facility]  Electronically Signed    JDK/MedQ  DD: 06/30/2006  DT: 07/01/2006  Job #: [id number]   cc:   S. Kyra Manges, M.D.

## 2010-12-15 ENCOUNTER — Other Ambulatory Visit: Payer: Self-pay | Admitting: Cardiology

## 2010-12-20 ENCOUNTER — Other Ambulatory Visit: Payer: Self-pay | Admitting: Cardiology

## 2011-01-18 ENCOUNTER — Other Ambulatory Visit: Payer: Self-pay | Admitting: Cardiology

## 2011-01-24 ENCOUNTER — Other Ambulatory Visit: Payer: Self-pay | Admitting: Cardiology

## 2011-01-31 ENCOUNTER — Encounter: Payer: Self-pay | Admitting: Internal Medicine

## 2011-02-04 ENCOUNTER — Encounter: Payer: Self-pay | Admitting: Internal Medicine

## 2011-02-07 ENCOUNTER — Other Ambulatory Visit: Payer: Self-pay | Admitting: Internal Medicine

## 2011-02-07 ENCOUNTER — Encounter (HOSPITAL_BASED_OUTPATIENT_CLINIC_OR_DEPARTMENT_OTHER): Payer: Medicare Other | Admitting: Internal Medicine

## 2011-02-07 ENCOUNTER — Ambulatory Visit (HOSPITAL_COMMUNITY)
Admission: RE | Admit: 2011-02-07 | Discharge: 2011-02-07 | Disposition: A | Discharge status: Home / Self Care | Payer: Medicare Other | Source: Ambulatory Visit | Attending: Internal Medicine | Admitting: Internal Medicine

## 2011-02-07 DIAGNOSIS — C3410 Malignant neoplasm of upper lobe, unspecified bronchus or lung: Secondary | ICD-10-CM

## 2011-02-07 DIAGNOSIS — N281 Cyst of kidney, acquired: Secondary | ICD-10-CM | POA: Insufficient documentation

## 2011-02-07 DIAGNOSIS — C3490 Malignant neoplasm of unspecified part of unspecified bronchus or lung: Secondary | ICD-10-CM

## 2011-02-07 DIAGNOSIS — K449 Diaphragmatic hernia without obstruction or gangrene: Secondary | ICD-10-CM | POA: Insufficient documentation

## 2011-02-07 DIAGNOSIS — R911 Solitary pulmonary nodule: Secondary | ICD-10-CM | POA: Insufficient documentation

## 2011-02-07 DIAGNOSIS — C439 Malignant melanoma of skin, unspecified: Secondary | ICD-10-CM

## 2011-02-07 LAB — CMP (CANCER CENTER ONLY)
AST: 24 U/L (ref 11–38)
Albumin: 3.8 g/dL (ref 3.3–5.5)
Alkaline Phosphatase: 45 U/L (ref 26–84)
Calcium: 9.5 mg/dL (ref 8.0–10.3)
Chloride: 100 mEq/L (ref 98–108)
Glucose, Bld: 101 mg/dL (ref 73–118)
Potassium: 4.2 mEq/L (ref 3.3–4.7)
Sodium: 142 mEq/L (ref 128–145)
Total Protein: 6.8 g/dL (ref 6.4–8.1)

## 2011-02-07 LAB — CBC WITH DIFFERENTIAL/PLATELET
EOS%: 5 % (ref 0.0–7.0)
Eosinophils Absolute: 0.4 10*3/uL (ref 0.0–0.5)
MCV: 93.3 fL (ref 79.5–101.0)
MONO%: 12 % (ref 0.0–14.0)
NEUT#: 4.2 10*3/uL (ref 1.5–6.5)
RBC: 4.81 10*6/uL (ref 3.70–5.45)
RDW: 13.2 % (ref 11.2–14.5)
lymph#: 2 10*3/uL (ref 0.9–3.3)

## 2011-02-07 MED ORDER — IOHEXOL 300 MG/ML  SOLN
80.0000 mL | Freq: Once | INTRAMUSCULAR | Status: AC | PRN
  Administered 2011-02-07: 80 mL via INTRAVENOUS

## 2011-02-08 ENCOUNTER — Ambulatory Visit (INDEPENDENT_AMBULATORY_CARE_PROVIDER_SITE_OTHER): Payer: Medicare Other | Admitting: Internal Medicine

## 2011-02-08 DIAGNOSIS — R1011 Right upper quadrant pain: Secondary | ICD-10-CM

## 2011-02-08 DIAGNOSIS — K219 Gastro-esophageal reflux disease without esophagitis: Secondary | ICD-10-CM

## 2011-02-08 DIAGNOSIS — K222 Esophageal obstruction: Secondary | ICD-10-CM

## 2011-02-09 NOTE — Progress Notes (Signed)
Subjective:    Patient ID: [name], female    DOB: 12-25-1940, 70 y.o.   MRN: [id number]  HPI Mrs. Beil presents for a one year h/o discomfort in the RUQ abdomen that is worse with hip flexion and relieved by stretching. The discomfort can be of rapid onset and will last 5-10 minutes. There is no radiation of pain to the back or to the groin. She denies any change in bowel habit, color or consistency of the stool, no fatty food intolerance, no N/V.She has had no fever or chills. She has had no abdominal surgery.  On questioning she does admit to having mild dysphagia, at times even to liquids. She has regurgitated twice secondary to symptoms. She does have reflux at times. She has not had any GI evaluation for this. The symptoms have gotten worse over time.   Past Medical History  Diagnosis Date  . LBBB (left bundle branch block)   . Cancer 1-10    lung- right non small cell, adenocarcinoma w/ broncho- alveolar features  . Hypertension   . Melanoma     left leg  . Hyperthyroidism     dr Everardo All- radioactive iodine treatment 2011- resolved  . Tremor     hand- intention tremor   Past Surgical History  Procedure Date  . Tonsillectomy   . Conization for cervical dysplasia   . Rul lobectomy 2010   Family History  Problem Relation Age of Onset  . Asthma Mother   . Heart disease Mother   . Thyroid disease Daughter     hypothyroidism  . Cancer Maternal Uncle     lung  . Cancer Maternal Grandfather     lung  . Diabetes Neg Hx   . Coronary artery disease Neg Hx    History   Social History  . Marital Status: Married    Spouse Name: N/A    Number of Children: 2  . Years of Education: N/A   Occupational History  . homemaker    Social History Main Topics  . Smoking status: Former Smoker -- 15.0 packs/day for 1 years    Types: Cigarettes  . Smokeless tobacco: Not on file   Comment: 33 yrs ago  . Alcohol Use: Not on file  . Drug Use: Not on file  . Sexually Active:  Not on file   Other Topics Concern  . Not on file   Social History Narrative   [employer] graduateMarried [id number] grandchildren and 2 step-grandchildrenMarriage in good healthPhysician Roster:Oncologist- Dr Marcelyn Bruins- Dr Doylene Canning- Dr Sonia Baller- Dr Terri Piedra       Review of Systems Review of Systems  Constitutional:  Negative for fever, chills, activity change and unexpected weight change.  HEENT:  Negative for hearing loss, ear pain, congestion, neck stiffness and postnasal drip. Negative for sore throat or swallowing problems. Negative for dental complaints.   Eyes: Negative for vision loss or change in visual acuity.  Respiratory: Negative for chest tightness and wheezing.  Mild DOE Cardiovascular: Negative for chest pain and palpitation. No decreased exercise tolerance Gastrointestinal: No change in bowel habit. No bloating or gas. No reflux or indigestion Genitourinary: Negative for urgency, frequency, flank pain and difficulty urinating.  Musculoskeletal: Negative for myalgias, back pain, arthralgias and gait problem.  Neurological: Negative for dizziness, tremors, weakness and headaches.  Hematological: Negative for adenopathy.  Psychiatric/Behavioral: Negative for behavioral problems and dysphoric mood.       Objective:   Physical Exam Vitals noted - mild elevation SBP Gen'l -  WNWD white woman in no distress HEENT - C&S clear w/o icterus Respiratory - no increased work of breathing Cor - RRR Abdomen - normal BS x 4, no guarding or rebound, no hepatomegaly. There is tenderness to percussion over the right anterior chest, no palpable liver edge. In the standing position no abdominal wall bulge.        Assessment & Plan:

## 2011-02-10 ENCOUNTER — Encounter (HOSPITAL_BASED_OUTPATIENT_CLINIC_OR_DEPARTMENT_OTHER): Payer: Medicare Other | Admitting: Internal Medicine

## 2011-02-10 ENCOUNTER — Other Ambulatory Visit: Payer: Medicare Other

## 2011-02-10 DIAGNOSIS — C3410 Malignant neoplasm of upper lobe, unspecified bronchus or lung: Secondary | ICD-10-CM

## 2011-02-10 DIAGNOSIS — R1011 Right upper quadrant pain: Secondary | ICD-10-CM | POA: Insufficient documentation

## 2011-02-10 DIAGNOSIS — C439 Malignant melanoma of skin, unspecified: Secondary | ICD-10-CM

## 2011-02-10 NOTE — Assessment & Plan Note (Signed)
Patient reports mild dysphagia in a setting of reflux.  Plan - refer to GI for evaluation and possible EGD with dilatation.

## 2011-02-10 NOTE — Assessment & Plan Note (Signed)
Long standing moderate intermittent abdominal pain RUQ that is worse with leg flexion and relieved with stretching. The history does not suggest gallbladder disease but this is a possibility. There is no evidence of hernia. No frank hepatomegaly but tenderness in is present in this region to  Exam suggesting possible fatty infiltration liver.  Plan - abdominal U/S

## 2011-02-11 ENCOUNTER — Ambulatory Visit
Admission: RE | Admit: 2011-02-11 | Discharge: 2011-02-11 | Disposition: A | Discharge status: Home / Self Care | Payer: Medicare Other | Source: Ambulatory Visit | Attending: Internal Medicine | Admitting: Internal Medicine

## 2011-02-11 DIAGNOSIS — R1011 Right upper quadrant pain: Secondary | ICD-10-CM

## 2011-02-14 ENCOUNTER — Telehealth: Payer: Self-pay | Admitting: Internal Medicine

## 2011-02-14 ENCOUNTER — Other Ambulatory Visit: Payer: Self-pay | Admitting: Cardiology

## 2011-02-14 NOTE — Telephone Encounter (Signed)
Informed pt she states she has the upper quad pain on occasions. She states she will call Prn

## 2011-02-14 NOTE — Telephone Encounter (Signed)
ok 

## 2011-02-14 NOTE — Telephone Encounter (Signed)
Please call patient: happy to report that the abdomnial U/S was totally normal. If the pain in the RUQ continues please return for further evaluation. Thanks

## 2011-02-15 ENCOUNTER — Ambulatory Visit (INDEPENDENT_AMBULATORY_CARE_PROVIDER_SITE_OTHER): Payer: Medicare Other | Admitting: Thoracic Surgery

## 2011-02-15 DIAGNOSIS — C3490 Malignant neoplasm of unspecified part of unspecified bronchus or lung: Secondary | ICD-10-CM

## 2011-02-15 NOTE — Assessment & Plan Note (Signed)
OFFICE VISIT  [name], [name] DOB:  1941-03-18                                        February 15, 2011 CHART #:  [id number]  The patient comes today and her CT scan is stable. There are no change in any of these cystic lesions.  She has had a recent gallbladder ultrasound that was also negative.  Her lungs are clear to auscultation and percussion.  Her blood pressure was 146/75, pulse 62, respirations 16 and sats were 95%.  I will see her back again in 6 months in January for probably a final check if her CT scan remains stable.  [name], M.D. Electronically Signed  DPB/MEDQ  D:  02/15/2011  T:  02/15/2011  Job:  [id number]

## 2011-02-22 ENCOUNTER — Ambulatory Visit: Payer: PRIVATE HEALTH INSURANCE | Admitting: Cardiology

## 2011-03-08 ENCOUNTER — Ambulatory Visit (INDEPENDENT_AMBULATORY_CARE_PROVIDER_SITE_OTHER): Payer: Medicare Other | Admitting: Internal Medicine

## 2011-03-08 ENCOUNTER — Encounter: Payer: Self-pay | Admitting: Internal Medicine

## 2011-03-08 DIAGNOSIS — K219 Gastro-esophageal reflux disease without esophagitis: Secondary | ICD-10-CM

## 2011-03-08 DIAGNOSIS — R1310 Dysphagia, unspecified: Secondary | ICD-10-CM

## 2011-03-08 NOTE — Progress Notes (Signed)
Subjective:    Patient ID: [name], female    DOB: [birth year], 70 y.o.   MRN: [id number]  HPI [name] is a 70 year old female with a past medical history of lung cancer status post right upper lobectomy, hypertension, LBBB, melanoma status post resection who is referred by Dr. Debby Bud for evaluation of dysphagia.  She is accompanied today by her husband. The patient reports intermittent dysphagia over the past one and a half years. She reports on several occasions, though rare, regurgitation of food. She states that her dysphasia occurs with both solids and liquids. She reports this feels like a "spasm". She's never had a food impaction. This is not associated with chest pain and there is no odynophagia. She also has no other abdominal pain and no nausea/vomiting.  She does report heartburn if she stops taking her Nexium. She reports her GERD symptoms seemed to worsen after her lung surgery approximately 3 years ago. This is worse at night. However when on PPI her symptoms rarely bother her.  Prior to her lung surgery her reflux symptoms were minor and less significant and always seem to respond to [facility] when necessary. She reports a good appetite and a stable, if not slightly increased, weight.    She also reports a chronic right upper quadrant to right lower chest pain. She also reports this feels like a "muscular spasm". This pain is worse with twisting or bending or when she presses over her right lower costal margin anteriorly.  This pain does not seem to relate to eating or bowel movement. She reports it having recently undergone an abdominal ultrasound which she reports was normal.   Review of Systems Constitutional: Negative for fever, chills, night sweats, activity change, appetite change and unexpected weight change HEENT: Negative for sore throat, mouth sores and trouble swallowing. Eyes: Negative for visual disturbance Respiratory: Negative for cough, chest tightness and shortness  of breath Cardiovascular: Negative for chest pain, palpitations and lower extremity swelling Gastrointestinal: See history of present illness Genitourinary: Negative for dysuria and hematuria. Musculoskeletal: Intermittent back pain, no new arthralgias or myalgias Skin: Negative for rash or color change Neurological: Negative for headaches, weakness, numbness Hematological: Negative for adenopathy, negative for easy bruising/bleeding Psychiatric/behavioral: Negative for depressed mood, negative for anxiety  Patient Active Problem List  Diagnoses  . GOITER, MULTINODULAR  . HYPERTHYROIDISM  . ESSENTIAL HYPERTENSION, BENIGN  . LEFT BUNDLE BRANCH BLOCK  . URI  . TREMOR  . GERD with stricture  . Abdominal pain, RUQ   Current outpatient prescriptions:atenolol (TENORMIN) 25 MG tablet, TAKE ONE TABLET BY MOUTH TWICE DAILY, Disp: 60 tablet, Rfl: 0;  calcium-vitamin D (OSCAL WITH D) 500-200 MG-UNIT per tablet, Take 2 tablets by mouth daily.  , Disp: , Rfl: ;  celecoxib (CELEBREX) 200 MG capsule, Take 200 mg by mouth as needed.  , Disp: , Rfl: ;  cholecalciferol (VITAMIN D) 1000 UNITS tablet, Take 1,000 Units by mouth daily.  , Disp: , Rfl:  cyanocobalamin 1000 MCG tablet, Take 100 mcg by mouth daily.  , Disp: , Rfl: ;  diltiazem (CARDIZEM CD) 240 MG 24 hr capsule, Take 1 capsule (240 mg total) by mouth daily., Disp: 30 capsule, Rfl: 0;  drospirenone-estradiol (ANGELIQ) 0.5-1 MG per tablet, Take 1 tablet by mouth daily.  , Disp: , Rfl: ;  hydrochlorothiazide 25 MG tablet, Take 25 mg by mouth daily.  , Disp: , Rfl:  multivitamin (THERAGRAN) tablet, Take 1 tablet by mouth daily.  , Disp: , Rfl: ;  NEXIUM 40 MG capsule, TAKE ONE (1) CAPSULE(S) ONCE DAILY, Disp: 30 capsule, Rfl: 0  Allergies  Allergen Reactions  . Meperidine Hcl   . Morphine   . Oxycodone-Acetaminophen   . Penicillins   . Propoxyphene N-Acetaminophen   . Tramadol Hcl     Social History  . Marital Status: Married    Number of  Children: 2   Occupational History  . homemaker    Social History Main Topics  . Smoking status: Former Smoker -- 15.0 packs/day for 1 years    Types: Cigarettes  . Smokeless tobacco: Never Used   Comment: 33 yrs ago  . Alcohol Use: Yes, 2 glasses wine/day  . Drug Use: No    Social History Narrative   [employer] graduateMarried [id number] grandchildren and 2 step-grandchildrenMarriage in good healthPhysician Roster:Oncologist- Dr Marcelyn Bruins- Dr Doylene Canning- Dr Sonia Baller- Dr Terri Piedra   Family History  Problem Relation Age of Onset  . Asthma Mother   . Heart disease Mother   . Thyroid disease Daughter     hypothyroidism  . Cancer Maternal Uncle     lung  . Cancer Maternal Grandfather     lung  . Diabetes Neg Hx   . Coronary artery disease Neg Hx        Objective:   Physical Exam BP 122/66  Pulse 68  Ht 5\' 5"  (1.651 m)  Wt 150 lb 9.6 oz (68.312 kg)  BMI 25.06 kg/m2 Constitutional: Well-developed and well-nourished. No distress. HEENT: Normocephalic and atraumatic. Oropharynx is clear and moist. No oropharyngeal exudate. Conjunctivae are normal. Pupils are equal round and reactive to light. No scleral icterus. Neck: Neck supple. Trachea midline. Cardiovascular: Normal rate, regular rhythm and intact distal pulses. No M/R/G Pulmonary/chest: Effort normal and breath sounds normal. No wheezing, rales or rhonchi. Abdominal: Soft, nontender, nondistended. There are no masses palpable. No hepatosplenomegaly. Lymphadenopathy: No cervical adenopathy noted. Neurological: Alert and oriented to person place and time. Skin: Skin is warm and dry. No rashes noted. Psychiatric: Normal mood and affect. Behavior is normal.  Imaging: 02/08/11 - RUQ US Findings:  Gallbladder: Sonographically normal without sludge or gallstones  with normal 2 mm wall thickness and no sonographic Murphy's sign  evoked.  Common bile duct: No dilated intrahepatic or extrahepatic bile  ducts with common bile duct  measuring normally at 4 mm.  Liver: Sonographically normal.  No free fluid.  IMPRESSION:  Normal.    Assessment & Plan:  70 year old female with a past medical history of lung cancer status post right upper lobectomy, hypertension, LBBB, melanoma status post resection who is referred by Dr. Debby Bud for evaluation of dysphagia in the setting of reflux disease.  1. Dysphagia - her symptoms are intermittent and occur with solids and liquids. Differential includes esophageal ring or web or esophageal dysmotility.  Will plan upper endoscopy. We did discuss possible dilation should a stricture be found.  The patient expresses significant anxiety regarding this procedure and thus it will be scheduled with propofol/MAC.  We briefly discussed esophageal dysmotility and limited treatment, but this can be discussed further if necessary after the endoscopy.  2. Colorectal cancer screening - the patient is average risk for colorectal cancer screening and has never had colonoscopy. We discussed this today and she is hesitant.  We also discussed other acceptable screening modalities including FOBT. She would prefer FOBT testing annually at this point.  We did discuss that should her FOBT ever be positive a colonoscopy would be recommended, and she states she would be  willing to proceed at that point.  She will discuss FOBT testing with her PCP at her next visit.

## 2011-03-08 NOTE — Patient Instructions (Signed)
You have been scheduled for an Endoscopy. Instructions have been provided.

## 2011-03-10 ENCOUNTER — Encounter: Payer: Self-pay | Admitting: Endocrinology

## 2011-03-10 ENCOUNTER — Other Ambulatory Visit (INDEPENDENT_AMBULATORY_CARE_PROVIDER_SITE_OTHER): Payer: Medicare Other

## 2011-03-10 ENCOUNTER — Ambulatory Visit (INDEPENDENT_AMBULATORY_CARE_PROVIDER_SITE_OTHER): Payer: Medicare Other | Admitting: Endocrinology

## 2011-03-10 VITALS — BP 120/80 | HR 56 | Temp 98.5°F | Ht 65.0 in | Wt 151.0 lb

## 2011-03-10 DIAGNOSIS — E042 Nontoxic multinodular goiter: Secondary | ICD-10-CM

## 2011-03-10 LAB — TSH: TSH: 5.85 u[IU]/mL — ABNORMAL HIGH (ref 0.35–5.50)

## 2011-03-10 NOTE — Patient Instructions (Addendum)
blood tests are being requested for you today.  please call [phone] to hear your test results.  You will be prompted to enter the 9-digit "MRN" number that appears at the top left of this page, followed by #.  Then you will hear the message. Please return in 1 year. most of the time, a "lumpy thyroid" will eventually become overactive again.  this is usually a slow process, happening over the span of many years. (update: i left message on phone-tree:  Tossup between synthroid 25/d, and no rx.  In either case, i have set you up for recheck tsh in 6 mos)

## 2011-03-10 NOTE — Progress Notes (Signed)
Subjective:    Patient ID: [name], female    DOB: [birth year], 70 y.o.   MRN: [id number]  HPI hyperthyroidism:  pt is now 16 months s/p i-131 rx, for hyperthyroidism, due to multinodular goiter.  pt states she feels well in general.  She does not notice the goiter.   Past Medical History  Diagnosis Date  . LBBB (left bundle branch block)   . Cancer 1-10    lung- right non small cell, adenocarcinoma w/ broncho- alveolar features  . Hypertension   . Melanoma     left leg  . Hyperthyroidism     dr Everardo All- radioactive iodine treatment 2011- resolved  . Tremor     hand- intention tremor    Past Surgical History  Procedure Date  . Tonsillectomy   . Conization for cervical dysplasia   . Rul lobectomy 2010    History   Social History  . Marital Status: Married    Spouse Name: N/A    Number of Children: 2  . Years of Education: N/A   Occupational History  . homemaker    Social History Main Topics  . Smoking status: Former Smoker -- 15.0 packs/day for 1 years    Types: Cigarettes  . Smokeless tobacco: Never Used   Comment: 33 yrs ago  . Alcohol Use: Yes  . Drug Use: No  . Sexually Active: Not on file   Other Topics Concern  . Not on file   Social History Narrative   [employer] graduateMarried [id number] grandchildren and 2 step-grandchildrenMarriage in good healthPhysician Roster:Oncologist- Dr Marcelyn Bruins- Dr Doylene Canning- Dr Sonia Baller- Dr Terri Piedra    Current Outpatient Prescriptions on File Prior to Visit  Medication Sig Dispense Refill  . atenolol (TENORMIN) 25 MG tablet TAKE ONE TABLET BY MOUTH TWICE DAILY  60 tablet  0  . calcium-vitamin D (OSCAL WITH D) 500-200 MG-UNIT per tablet Take 2 tablets by mouth daily.        . celecoxib (CELEBREX) 200 MG capsule Take 200 mg by mouth as needed.        . cholecalciferol (VITAMIN D) 1000 UNITS tablet Take 1,000 Units by mouth daily.        . cyanocobalamin 1000 MCG tablet Take 100 mcg by mouth daily.        Marland Kitchen diltiazem  (CARDIZEM CD) 240 MG 24 hr capsule Take 1 capsule (240 mg total) by mouth daily.  30 capsule  0  . drospirenone-estradiol (ANGELIQ) 0.5-1 MG per tablet Take 1 tablet by mouth daily.        . hydrochlorothiazide 25 MG tablet Take 25 mg by mouth daily.        . multivitamin (THERAGRAN) tablet Take 1 tablet by mouth daily.        Marland Kitchen NEXIUM 40 MG capsule TAKE ONE (1) CAPSULE(S) ONCE DAILY  30 capsule  0    Allergies  Allergen Reactions  . Meperidine Hcl   . Morphine   . Oxycodone-Acetaminophen   . Penicillins   . Propoxyphene N-Acetaminophen   . Tramadol Hcl     Family History  Problem Relation Age of Onset  . Asthma Mother   . Heart disease Mother   . Thyroid disease Daughter     hypothyroidism  . Cancer Maternal Uncle     lung  . Cancer Maternal Grandfather     lung  . Diabetes Neg Hx   . Coronary artery disease Neg Hx     BP 120/80  Pulse 56  Temp(Src)  98.5 F (36.9 C) (Oral)  Ht 5\' 5"  (1.651 m)  Wt 151 lb (68.493 kg)  BMI 25.13 kg/m2  SpO2 97%  Review of Systems Denies weight change.    Objective:   Physical Exam VITAL SIGNS:  See vs page GENERAL: no distress Neck:  There are several easily palpable thyroid nodules, 1-2 cm diameter.    Lab Results  Component Value Date   TSH 5.85* 03/10/2011      Assessment & Plan:  Post-i-131 hypothyroidism, new.  mild

## 2011-03-22 ENCOUNTER — Other Ambulatory Visit: Payer: Self-pay | Admitting: Cardiology

## 2011-03-31 ENCOUNTER — Encounter: Payer: Medicare Other | Admitting: Internal Medicine

## 2011-04-01 ENCOUNTER — Ambulatory Visit: Payer: Self-pay | Admitting: Cardiology

## 2011-04-01 DIAGNOSIS — E0590 Thyrotoxicosis, unspecified without thyrotoxic crisis or storm: Secondary | ICD-10-CM | POA: Insufficient documentation

## 2011-04-01 DIAGNOSIS — C801 Malignant (primary) neoplasm, unspecified: Secondary | ICD-10-CM | POA: Insufficient documentation

## 2011-04-01 DIAGNOSIS — R251 Tremor, unspecified: Secondary | ICD-10-CM | POA: Insufficient documentation

## 2011-04-04 ENCOUNTER — Encounter: Payer: Self-pay | Admitting: Cardiology

## 2011-04-04 ENCOUNTER — Ambulatory Visit (INDEPENDENT_AMBULATORY_CARE_PROVIDER_SITE_OTHER): Payer: Medicare Other | Admitting: Cardiology

## 2011-04-04 VITALS — BP 130/72 | HR 57 | Ht 65.0 in | Wt 153.0 lb

## 2011-04-04 DIAGNOSIS — I447 Left bundle-branch block, unspecified: Secondary | ICD-10-CM

## 2011-04-04 DIAGNOSIS — I10 Essential (primary) hypertension: Secondary | ICD-10-CM

## 2011-04-04 NOTE — Assessment & Plan Note (Signed)
Blood pressure is controlled. No change in therapy. 

## 2011-04-04 NOTE — Assessment & Plan Note (Signed)
Patient has a left bundle branch block.  No further workup is needed.

## 2011-04-04 NOTE — Patient Instructions (Signed)
Your physician recommends that you schedule a follow-up appointment in: 12 months, the office will mail you a reminder letter 2 months prior appointment date. Your physician recommends that you continue on your current medications as directed. Please refer to the Current Medication list given to you today. 

## 2011-04-04 NOTE — Progress Notes (Signed)
HPI Is seen for followup of hypertension and left bundle branch block.  She is actually doing very well.  She's had her thyroid treated.  Her lung cancer is under excellent control.  She is not having chest pain or shortness of breath. Allergies  Allergen Reactions  . Meperidine Hcl   . Morphine   . Oxycodone-Acetaminophen   . Penicillins   . Propoxyphene N-Acetaminophen   . Tramadol Hcl     Current Outpatient Prescriptions  Medication Sig Dispense Refill  . Aspirin-Salicylamide-Caffeine (BC HEADACHE POWDER PO) Take 1 packet by mouth daily.        Marland Kitchen atenolol (TENORMIN) 25 MG tablet TAKE ONE TABLET BY MOUTH TWICE DAILY  60 tablet  3  . calcium-vitamin D (OSCAL WITH D) 500-200 MG-UNIT per tablet Take 1 tablet by mouth daily.       . celecoxib (CELEBREX) 200 MG capsule Take 200 mg by mouth as needed.        . cholecalciferol (VITAMIN D) 1000 UNITS tablet Take 1,000 Units by mouth daily.        . cyanocobalamin 1000 MCG tablet Take 100 mcg by mouth daily.        Marland Kitchen diltiazem (CARDIZEM CD) 240 MG 24 hr capsule Take 1 capsule (240 mg total) by mouth daily.  30 capsule  0  . drospirenone-estradiol (ANGELIQ) 0.5-1 MG per tablet Take 1 tablet by mouth daily.        . hydrochlorothiazide 25 MG tablet Take 25 mg by mouth daily.        . multivitamin (THERAGRAN) tablet Take 1 tablet by mouth daily.        Marland Kitchen NEXIUM 40 MG capsule TAKE ONE (1) CAPSULE(S) ONCE DAILY  30 capsule  3    History   Social History  . Marital Status: Married    Spouse Name: N/A    Number of Children: 2  . Years of Education: N/A   Occupational History  . homemaker    Social History Main Topics  . Smoking status: Former Smoker -- 15.0 packs/day for 1 years    Types: Cigarettes  . Smokeless tobacco: Never Used   Comment: 33 yrs ago  . Alcohol Use: Yes  . Drug Use: No  . Sexually Active: Not on file   Other Topics Concern  . Not on file   Social History Narrative   [employer] graduateMarried [id number] grandchildren and  2 step-grandchildrenMarriage in good healthPhysician Roster:Oncologist- Dr Marcelyn Bruins- Dr Doylene Canning- Dr Sonia Baller- Dr Terri Piedra    Family History  Problem Relation Age of Onset  . Asthma Mother   . Heart disease Mother   . Thyroid disease Daughter     hypothyroidism  . Cancer Maternal Uncle     lung  . Cancer Maternal Grandfather     lung  . Diabetes Neg Hx   . Coronary artery disease Neg Hx     Past Medical History  Diagnosis Date  . LBBB (left bundle branch block)   . Cancer 1-10    lung- right non small cell, adenocarcinoma w/ broncho- alveolar features  . Hypertension   . Melanoma     left leg  . Hyperthyroidism     dr Everardo All- radioactive iodine treatment 2011- resolved  . Tremor     hand- intention tremor    Past Surgical History  Procedure Date  . Tonsillectomy   . Conization for cervical dysplasia   . Rul lobectomy 2010    ROS  Patient denies fever, chills,  headache, sweats, rash, change in vision, change in hearing, chest pain, cough, nausea vomiting, urinary symptoms.  All of the systems are reviewed and are negative. PHYSICAL EXAM Patient is oriented to person time and place.  Affect is normal.  Head is atraumatic.  There is no jugular venous distention.  Lungs are clear.  Respiratory effort is nonlabored.  Cardiac exam reveals S1-S2.  No clicks or significant murmurs.  The abdomen is soft.  There is no peripheral edema. Filed Vitals:   04/04/11 1519  BP: 130/72  Pulse: 57  Height: 5\' 5"  (1.651 m)  Weight: 153 lb (69.4 kg)    EKG is done today and reviewed by me.  There is no change.  She has old left bundle branch block.  ASSESSMENT & PLAN

## 2011-04-28 ENCOUNTER — Encounter: Payer: Self-pay | Admitting: Internal Medicine

## 2011-04-28 ENCOUNTER — Ambulatory Visit (AMBULATORY_SURGERY_CENTER): Payer: Medicare Other | Admitting: Internal Medicine

## 2011-04-28 VITALS — BP 163/81 | HR 80 | Temp 99.0°F | Resp 27 | Ht 65.0 in | Wt 150.0 lb

## 2011-04-28 DIAGNOSIS — K2970 Gastritis, unspecified, without bleeding: Secondary | ICD-10-CM

## 2011-04-28 DIAGNOSIS — K319 Disease of stomach and duodenum, unspecified: Secondary | ICD-10-CM

## 2011-04-28 DIAGNOSIS — R1310 Dysphagia, unspecified: Secondary | ICD-10-CM

## 2011-04-28 MED ORDER — SODIUM CHLORIDE 0.9 % IV SOLN: 500.0000 mL | INTRAVENOUS | Status: DC

## 2011-04-28 NOTE — Patient Instructions (Signed)
FOLLOW DILATATION DIET GIVEN TO YOU TODAY  FOLLOW DISCHARGE INSTRUCTIONS (BLUE & GREEN SHEETS).   INFORMATION GIVEN TO YOU ON HIATAL HERNIA, GASTRITIS , & GERD

## 2011-04-29 ENCOUNTER — Telehealth: Payer: Self-pay

## 2011-04-29 NOTE — Telephone Encounter (Signed)

## 2011-05-05 ENCOUNTER — Encounter: Payer: Self-pay | Admitting: Internal Medicine

## 2011-05-06 ENCOUNTER — Telehealth: Payer: Self-pay | Admitting: *Deleted

## 2011-05-06 NOTE — Telephone Encounter (Signed)
Notified pt Dr Rhea Belton would like to f/u with her after her ESA. Pt reports she's doing fine. Appt. 06/20/11 @ 10am; pt stated understanding.

## 2011-06-15 ENCOUNTER — Encounter: Payer: Self-pay | Admitting: Internal Medicine

## 2011-06-20 ENCOUNTER — Encounter: Payer: Self-pay | Admitting: Internal Medicine

## 2011-06-20 ENCOUNTER — Ambulatory Visit (INDEPENDENT_AMBULATORY_CARE_PROVIDER_SITE_OTHER): Payer: Medicare Other | Admitting: Internal Medicine

## 2011-06-20 VITALS — BP 112/64 | HR 64

## 2011-06-20 DIAGNOSIS — K2960 Other gastritis without bleeding: Secondary | ICD-10-CM | POA: Insufficient documentation

## 2011-06-20 DIAGNOSIS — Z1211 Encounter for screening for malignant neoplasm of colon: Secondary | ICD-10-CM

## 2011-06-20 DIAGNOSIS — T39395A Adverse effect of other nonsteroidal anti-inflammatory drugs [NSAID], initial encounter: Secondary | ICD-10-CM | POA: Insufficient documentation

## 2011-06-20 DIAGNOSIS — K219 Gastro-esophageal reflux disease without esophagitis: Secondary | ICD-10-CM

## 2011-06-20 MED ORDER — FAMOTIDINE 40 MG PO TABS: 40.0000 mg | ORAL_TABLET | Freq: Every evening | ORAL | Status: DC | PRN

## 2011-06-20 MED ORDER — ESOMEPRAZOLE MAGNESIUM 40 MG PO CPDR: 40.0000 mg | DELAYED_RELEASE_CAPSULE | Freq: Every day | ORAL | Status: DC

## 2011-06-20 NOTE — Patient Instructions (Signed)
Dr. Rhea Belton would like you to continue taking Nexium. 1 tablet daily.  Start taking Pepcid daily. Take 1 capsule at bedtime as needed.  Follow up as needed.

## 2011-06-20 NOTE — Progress Notes (Signed)
Subjective:    Patient ID: [name], female    DOB: 14-Oct-1940, 70 y.o.   MRN: [id number]  HPI 70 year old female with a past medical history of lung cancer status post right upper lobectomy, hypertension, LBBB, melanoma status post resection who is seen in followup. She presents alone today. She underwent upper endoscopy on 04/28/2011 which revealed tortuous distal esophagus with the GE junction ring which was dilated to 15 mm with a TTS balloon.  She also had a 5 cm hiatus hernia and moderate to severe erosive antral gastritis which was H. pylori negative on biopsy.  Today she reports she had no major change in her intermittent dysphagia after dilation. She reports her symptoms continue to be very intermittent, and went occurring can happen with solids or liquids. No odynophagia. No heartburn and she remains on Nexium 40 mg daily. She does occasionally report epigastric burning pain. This usually occurs at night and when it does she uses TUMS with some relief. This is very intermittent and she attributes this to her diet on a given day.  No melena or rectal bleeding. No fevers or chills. Appetite remains good and weight is stable. She does report using BC powder twice daily for many years. Previously she was using this as many as 4 times daily, but she makes a concerted effort to only use this twice daily.   Review of Systems As per history of present illness, otherwise negative  Past Medical History  Diagnosis Date  . LBBB (left bundle branch block)   . Cancer 1-10    lung- right non small cell, adenocarcinoma w/ broncho- alveolar features  . Hypertension   . Melanoma     left leg  . Hyperthyroidism     dr Everardo All- radioactive iodine treatment 2011- resolved  . Tremor     hand- intention tremor   Current Outpatient Prescriptions  Medication Sig Dispense Refill  . Aspirin-Salicylamide-Caffeine (BC HEADACHE POWDER PO) Take 1 packet by mouth daily.        Marland Kitchen atenolol (TENORMIN) 25 MG  tablet TAKE ONE TABLET BY MOUTH TWICE DAILY  60 tablet  3  . calcium-vitamin D (OSCAL WITH D) 500-200 MG-UNIT per tablet Take 1 tablet by mouth daily.       . celecoxib (CELEBREX) 200 MG capsule Take 200 mg by mouth as needed.        . cholecalciferol (VITAMIN D) 1000 UNITS tablet Take 1,000 Units by mouth daily.        . cyanocobalamin 1000 MCG tablet Take 100 mcg by mouth daily.        Marland Kitchen diltiazem (CARDIZEM CD) 240 MG 24 hr capsule Take 1 capsule (240 mg total) by mouth daily.  30 capsule  0  . drospirenone-estradiol (ANGELIQ) 0.5-1 MG per tablet Take 1 tablet by mouth daily.        Marland Kitchen esomeprazole (NEXIUM) 40 MG capsule Take 1 capsule (40 mg total) by mouth daily.  30 capsule  3  . hydrochlorothiazide 25 MG tablet Take 25 mg by mouth daily.        . multivitamin (THERAGRAN) tablet Take 1 tablet by mouth daily.        . famotidine (PEPCID) 40 MG tablet Take 1 tablet (40 mg total) by mouth at bedtime as needed for heartburn.  30 tablet  1   Allergies  Allergen Reactions  . Meperidine Hcl   . Morphine   . Oxycodone-Acetaminophen   . Penicillins   . Propoxyphene N-Acetaminophen   .  Tramadol Hcl    Family History  Problem Relation Age of Onset  . Asthma Mother   . Heart disease Mother   . Thyroid disease Daughter     hypothyroidism  . Cancer Maternal Uncle     lung  . Cancer Maternal Grandfather     lung  . Diabetes Neg Hx   . Coronary artery disease Neg Hx    SH - reviewed and no change     Objective:   Physical Exam BP 112/64  Pulse 64 Constitutional: Well-developed and well-nourished. No distress. HEENT: Normocephalic and atraumatic. Oropharynx is clear and moist. No oropharyngeal exudate. Conjunctivae are normal. Pupils are equal round and reactive to light. No scleral icterus. Neck: Neck supple. Trachea midline. Cardiovascular: Normal rate, regular rhythm and intact distal pulses. No M/R/G Pulmonary/chest: Effort normal and breath sounds normal. No wheezing, rales or  rhonchi. Abdominal: Soft, nontender, nondistended. Bowel sounds active throughout. There are no masses palpable. No hepatosplenomegaly. Extremities: no clubbing, cyanosis, or edema Neurological: Alert and oriented to person place and time. Skin: Skin is warm and dry. No rashes noted. Psychiatric: Normal mood and affect. Behavior is normal.     Assessment & Plan:  70 year old female with a past medical history of lung cancer status post right upper lobectomy, hypertension, LBBB, melanoma status post resection who is seen in followup  1. Dysphagia -- unfortunately the patient did not gain major relief of intermittent dysphagia after balloon dilation of  GE junction ring.  The ring was disrupted after dilation, but intermittent symptoms remain.  These symptoms are likely related to a component of esophageal dysmotility and presbyesophagus. No mass or obstructing lesion was noted.  Unfortunately, at this point there isn't additional therapy likely to be helpful. She understands this and feels she can make lifestyle modifications which makes her swallowing easier. If her symptoms worsen significant she will notify me.  2. Gastritis -- the patient's time biopsies were negative for H. pylori infection, however she does continue to use NSAIDs on a daily basis.  Her gastritis is felt NSAID induced. We discussed this medication use and she feels it helps her significantly. With this in mind I have recommended she remain on Nexium 40 mg daily for as long as she uses daily NSAIDs.  Hopefully daily PPI will prevent ulcer formation or bleeding.  For her breakthrough epigastric burning pain/GERD which usually occurs at night, I have recommended when necessary famotidine 10-20 mg. If her gastric symptoms worsen again I've asked that she notify me.  She voices understanding and is happy with the plan  3. CRC screening -- the patient prefers FOBT testing and this can be performed by her PCP.

## 2011-07-15 ENCOUNTER — Other Ambulatory Visit: Payer: Self-pay

## 2011-07-15 MED ORDER — DROSPIRENONE-ESTRADIOL 0.5-1 MG PO TABS: 1.0000 | ORAL_TABLET | Freq: Every day | ORAL | Status: DC

## 2011-07-15 NOTE — Telephone Encounter (Signed)
Pt advised that requested medication has been refilled

## 2011-07-15 NOTE — Telephone Encounter (Signed)
Pt is requesting refill of Hormone medication.

## 2011-07-19 ENCOUNTER — Other Ambulatory Visit: Payer: Self-pay | Admitting: Thoracic Surgery

## 2011-07-19 DIAGNOSIS — C3490 Malignant neoplasm of unspecified part of unspecified bronchus or lung: Secondary | ICD-10-CM

## 2011-07-21 ENCOUNTER — Other Ambulatory Visit: Payer: Self-pay | Admitting: Cardiology

## 2011-07-29 ENCOUNTER — Telehealth: Payer: Self-pay | Admitting: Internal Medicine

## 2011-07-29 ENCOUNTER — Other Ambulatory Visit: Payer: Self-pay | Admitting: Internal Medicine

## 2011-07-29 DIAGNOSIS — C3490 Malignant neoplasm of unspecified part of unspecified bronchus or lung: Secondary | ICD-10-CM

## 2011-07-29 NOTE — Telephone Encounter (Signed)
Called pt's home phone and was informed by husband that pt was out of town until Monday and it may be best to talk to her so she can check her calendar. Called pt's cell and lmonvm re appts for 2/8 lb/ct and /11 f/u. Schedule mailed today and pt asked to call if she has questions or cannot keep appts.

## 2011-08-19 ENCOUNTER — Encounter (HOSPITAL_COMMUNITY): Payer: Self-pay

## 2011-08-19 ENCOUNTER — Ambulatory Visit (HOSPITAL_COMMUNITY)
Admission: RE | Admit: 2011-08-19 | Discharge: 2011-08-19 | Disposition: A | Discharge status: Home / Self Care | Payer: Medicare Other | Source: Ambulatory Visit | Attending: Internal Medicine | Admitting: Internal Medicine

## 2011-08-19 ENCOUNTER — Other Ambulatory Visit (HOSPITAL_BASED_OUTPATIENT_CLINIC_OR_DEPARTMENT_OTHER): Payer: Medicare Other | Admitting: Lab

## 2011-08-19 DIAGNOSIS — C3410 Malignant neoplasm of upper lobe, unspecified bronchus or lung: Secondary | ICD-10-CM | POA: Diagnosis not present

## 2011-08-19 DIAGNOSIS — K449 Diaphragmatic hernia without obstruction or gangrene: Secondary | ICD-10-CM | POA: Diagnosis not present

## 2011-08-19 DIAGNOSIS — I2510 Atherosclerotic heart disease of native coronary artery without angina pectoris: Secondary | ICD-10-CM | POA: Insufficient documentation

## 2011-08-19 DIAGNOSIS — C3490 Malignant neoplasm of unspecified part of unspecified bronchus or lung: Secondary | ICD-10-CM | POA: Diagnosis not present

## 2011-08-19 DIAGNOSIS — Z8541 Personal history of malignant neoplasm of cervix uteri: Secondary | ICD-10-CM | POA: Diagnosis not present

## 2011-08-19 DIAGNOSIS — R918 Other nonspecific abnormal finding of lung field: Secondary | ICD-10-CM | POA: Diagnosis not present

## 2011-08-19 LAB — CMP (CANCER CENTER ONLY)
Albumin: 3.6 g/dL (ref 3.3–5.5)
CO2: 30 mEq/L (ref 18–33)
Glucose, Bld: 101 mg/dL (ref 73–118)
Potassium: 3.5 mEq/L (ref 3.3–4.7)
Sodium: 142 mEq/L (ref 128–145)
Total Protein: 6.9 g/dL (ref 6.4–8.1)

## 2011-08-19 LAB — CBC WITH DIFFERENTIAL/PLATELET
Eosinophils Absolute: 0.4 10*3/uL (ref 0.0–0.5)
LYMPH%: 32.7 % (ref 14.0–49.7)
MONO#: 0.7 10*3/uL (ref 0.1–0.9)
NEUT#: 4 10*3/uL (ref 1.5–6.5)
Platelets: 193 10*3/uL (ref 145–400)
RBC: 4.58 10*6/uL (ref 3.70–5.45)
RDW: 13.5 % (ref 11.2–14.5)
WBC: 7.5 10*3/uL (ref 3.9–10.3)

## 2011-08-19 MED ORDER — IOHEXOL 300 MG/ML  SOLN
80.0000 mL | Freq: Once | INTRAMUSCULAR | Status: AC | PRN
  Administered 2011-08-19: 80 mL via INTRAVENOUS

## 2011-08-22 ENCOUNTER — Ambulatory Visit (HOSPITAL_BASED_OUTPATIENT_CLINIC_OR_DEPARTMENT_OTHER): Payer: Medicare Other | Admitting: Internal Medicine

## 2011-08-22 ENCOUNTER — Encounter: Payer: Self-pay | Admitting: *Deleted

## 2011-08-22 ENCOUNTER — Telehealth: Payer: Self-pay | Admitting: *Deleted

## 2011-08-22 VITALS — BP 149/78 | HR 53 | Temp 97.2°F | Ht 65.0 in | Wt 153.1 lb

## 2011-08-22 DIAGNOSIS — C3410 Malignant neoplasm of upper lobe, unspecified bronchus or lung: Secondary | ICD-10-CM | POA: Diagnosis not present

## 2011-08-22 DIAGNOSIS — C3490 Malignant neoplasm of unspecified part of unspecified bronchus or lung: Secondary | ICD-10-CM

## 2011-08-22 NOTE — Telephone Encounter (Signed)
Left message for pt regarding appt with Dr. Edwyna Shell 08/31/11 at 2:45

## 2011-08-22 NOTE — Progress Notes (Signed)
[facility] OFFICE PROGRESS NOTE  Jessica Regulus, MD, MD [address] [place] [address]  PRINCIPAL DIAGNOSES:   1. Stage IA (T1a N0, Mx) non-small cell lung cancer, adenocarcinoma with bronchoalveolar features diagnosed in January 2010.  The patient presented at that time with a right upper lobe lesion, as well as suspicious ground-glass opacities in the left lung.  The tumor was negative for EGFR mutation and negative for ALK gene translocation.  2. Stage IA malignant melanoma, status post wide excision by Dr. Terri Piedra on Nov 12, 2008.  PRIOR THERAPY:  Status post right upper lobectomy with lymph node dissection under the care of Dr. Edwyna Shell on May 21, 2008.  CURRENT THERAPY:  Observation.  INTERVAL HISTORY: [name] 71 y.o. female returns to the clinic today for followup visit accompanied her husband. The patient has no complaints today. She denied having any significant chest pain or shortness of breath, no cough or hemoptysis. She has no fever or chills. No significant weight loss or night sweats. The patient has repeat CT scan of the chest performed recently and she is here today for evaluation and discussion of her scan results.  MEDICAL HISTORY: Past Medical History  Diagnosis Date  . LBBB (left bundle branch block)   . Hypertension   . Hyperthyroidism     dr Everardo All- radioactive iodine treatment 2011- resolved  . Tremor     hand- intention tremor  . Cancer 1-10    lung- right non small cell, adenocarcinoma w/ broncho- alveolar features  . Melanoma     left leg    ALLERGIES:  is allergic to meperidine hcl; morphine; oxycodone-acetaminophen; penicillins; propoxyphene n-acetaminophen; and tramadol hcl.  MEDICATIONS:  Current Outpatient Prescriptions  Medication Sig Dispense Refill  . Aspirin-Salicylamide-Caffeine (BC HEADACHE POWDER PO) Take 1 packet by mouth daily.        Marland Kitchen atenolol (TENORMIN) 25 MG tablet TAKE ONE TABLET BY MOUTH  TWICE DAILY  60 tablet  6  . calcium-vitamin D (OSCAL WITH D) 500-200 MG-UNIT per tablet Take 1 tablet by mouth daily.       . celecoxib (CELEBREX) 200 MG capsule Take 200 mg by mouth as needed.        . cholecalciferol (VITAMIN D) 1000 UNITS tablet Take 1,000 Units by mouth daily.        . cyanocobalamin 1000 MCG tablet Take 100 mcg by mouth daily.        Marland Kitchen diltiazem (CARDIZEM CD) 240 MG 24 hr capsule Take 1 capsule (240 mg total) by mouth daily.  30 capsule  0  . drospirenone-estradiol (ANGELIQ) 0.5-1 MG per tablet Take 1 tablet by mouth daily.  30 tablet  11  . esomeprazole (NEXIUM) 40 MG capsule Take 1 capsule (40 mg total) by mouth daily.  30 capsule  3  . famotidine (PEPCID) 40 MG tablet Take 1 tablet (40 mg total) by mouth at bedtime as needed for heartburn.  30 tablet  1  . hydrochlorothiazide 25 MG tablet Take 25 mg by mouth daily.        . multivitamin (THERAGRAN) tablet Take 1 tablet by mouth daily.          SURGICAL HISTORY:  Past Surgical History  Procedure Date  . Tonsillectomy   . Conization for cervical dysplasia   . Rul lobectomy 2010    REVIEW OF SYSTEMS:  A comprehensive review of systems was negative.   PHYSICAL EXAMINATION: General appearance:  alert, cooperative and no distress Lymph nodes: Cervical, supraclavicular, and axillary nodes normal. Resp: clear to auscultation bilaterally Cardio: regular rate and rhythm, S1, S2 normal, no murmur, click, rub or gallop GI: soft, non-tender; bowel sounds normal; no masses,  no organomegaly Extremities: extremities normal, atraumatic, no cyanosis or edema Neurologic: Alert and oriented X 3, normal strength and tone. Normal symmetric reflexes. Normal coordination and gait  ECOG PERFORMANCE STATUS: 0 - Asymptomatic  Blood pressure 149/78, pulse 53, temperature 97.2 F (36.2 C), temperature source Oral, height 5\' 5"  (1.651 m), weight 153 lb 1.6 oz (69.446 kg).  LABORATORY DATA: Lab Results  Component Value Date   WBC  7.5 08/19/2011   HGB 14.2 08/19/2011   HCT 42.4 08/19/2011   MCV 92.5 08/19/2011   PLT 193 08/19/2011      Chemistry      Component Value Date/Time   NA 142 08/19/2011 1343   NA 139 01/21/2010 1018   K 3.5 08/19/2011 1343   K 4.0 01/21/2010 1018   CL 102 08/19/2011 1343   CL 104 01/21/2010 1018   CO2 30 08/19/2011 1343   CO2 27 01/21/2010 1018   BUN 22 08/19/2011 1343   BUN 21 01/21/2010 1018   CREATININE 1.4* 08/19/2011 1343   CREATININE 1.24* 01/21/2010 1018      Component Value Date/Time   CALCIUM 9.0 08/19/2011 1343   CALCIUM 9.4 01/21/2010 1018   ALKPHOS 45 08/19/2011 1343   ALKPHOS 46 01/21/2010 1018   AST 17 08/19/2011 1343   AST 20 01/21/2010 1018   ALT 16 01/21/2010 1018   BILITOT 0.50 08/19/2011 1343   BILITOT 0.7 01/21/2010 1018       RADIOGRAPHIC STUDIES: Ct Chest W Contrast  08/19/2011  *RADIOLOGY REPORT*  Clinical Data: History of cervical cancer diagnosed in 1988.  Lung cancer diagnosed in 2010.  CT CHEST WITH CONTRAST  Technique:  Multidetector CT imaging of the chest was performed following the standard protocol during bolus administration of intravenous contrast.  Contrast: 80mL OMNIPAQUE IOHEXOL 300 MG/ML IV SOLN  Comparison: CT of thorax 02/07/2011.  Findings:  Mediastinum: Heart size is normal. There is no significant pericardial fluid, thickening or pericardial calcification. There is atherosclerosis of the thoracic aorta, the great vessels of the mediastinum and the coronary arteries, including calcified atherosclerotic plaque in the left anterior descending, left circumflex and right coronary arteries. No pathologically enlarged mediastinal or hilar lymph nodes. AP window node has decreased in size compared the prior examination, currently measuring only 6 mm in short axis (image 22 of series 2). There is a small hiatal hernia. Thyroid gland is diffusely heterogeneous with multiple small nodules (unchanged).  Lungs/Pleura: Interval enlargement of left upper lobe nodule (image 14 of series 5),  which currently measures approximately 2.3 x 1.8 cm, with an aggressive appearance demonstrating multiple peripheral spiculations. An air bronchogram is present within the center of this lesion (image 13 of series 5).Previously noted ground-glass attenuation nodule in the left lower lobe (image 20 of series 5) is unchanged measuring 1.7 x 1.2 cm.  Multiple other previously noted ground-glass attenuation nodules otherwise appear similar in size, number and distribution compared to the prior examination.  No consolidative airspace disease.  No pleural effusions.  Status post right upper lobectomy.  Upper Abdomen: Similar to 0.4 cm exophytic intermediate attenuation (30 HU) lesion in the upper pole of the right kidney likely represents a proteinaceous or hemorrhagic cyst.  Multifocal parenchymal thinning in the kidneys likely reflects scarring from prior  infection or infarction.  Musculoskeletal: There are no aggressive appearing lytic or blastic lesions noted in the visualized portions of the skeleton.  IMPRESSION: 1. Although the vast majority of the multicentric ground-glass attenuation nodules appear similar to the prior examination, there has been interval enlargement of one of these lesions in the left upper lobe, which currently measures 2.3 x 1.8 cm.  This lesion does have an internal air bronchogram, but is otherwise very aggressive in appearance.  Overall this is favored to represent transformation from a minimally invasive adenocarcinoma to an invasive adenocarcinoma, and consideration for biopsy is recommended.  This result was called by telephone on 08/19/2011  at  03:05 p.m. to  Nurse Jan (for Dr. Arbutus Ped), who verbally acknowledged these results.  2. Atherosclerosis, including three-vessel coronary artery disease. Please note that although the presence of coronary artery calcium documents the presence of coronary artery disease, the severity of this disease and any potential stenosis cannot be assessed  on this non-gated CT examination.  Assessment for potential risk factor modification, dietary therapy or pharmacologic therapy may be warranted, if clinically indicated. 3.  Small hiatal hernia.  Original Report Authenticated By: Florencia Reasons, M.D.    ASSESSMENT: This is a very pleasant 71 years old white female with history of stage IA non-small cell lung cancer and multiple bilateral pulmonary groundglass opacities. The patient has concerning findings on the recent scancity the lesion in the left upper lobe which is currently more dense and bigger compared to the previous scan. I discussed the scan results and showed the images to the patient and her husband.  PLAN: I will refer her to Dr. Edwyna Shell for consideration of tissue diagnosis before considering any further treatment. The patient could be a candidate for wedge resection or stereotactic radiotherapy to this lesion. I would see her back for followup visit after the tissue diagnosis. She knows to call me if she has any concerning symptoms.   All questions were answered. The patient knows to call the clinic with any problems, questions or concerns. We can certainly see the patient much sooner if necessary.

## 2011-08-22 NOTE — Progress Notes (Signed)
Spoke with pt regarding possible ENB procedure.  Agricultural engineer given.  Questions and concerns answered.  TCTS contacted regarding pt.

## 2011-08-31 ENCOUNTER — Other Ambulatory Visit: Payer: Self-pay

## 2011-08-31 ENCOUNTER — Encounter: Payer: Self-pay | Admitting: Thoracic Surgery

## 2011-08-31 ENCOUNTER — Ambulatory Visit (INDEPENDENT_AMBULATORY_CARE_PROVIDER_SITE_OTHER): Payer: Medicare Other | Admitting: Thoracic Surgery

## 2011-08-31 VITALS — BP 134/76 | HR 66 | Resp 18 | Ht 66.0 in | Wt 151.0 lb

## 2011-08-31 DIAGNOSIS — D381 Neoplasm of uncertain behavior of trachea, bronchus and lung: Secondary | ICD-10-CM

## 2011-08-31 DIAGNOSIS — C3490 Malignant neoplasm of unspecified part of unspecified bronchus or lung: Secondary | ICD-10-CM

## 2011-08-31 NOTE — Progress Notes (Signed)
HPI patient returns today after CT scan. CT scan shows some enlargement of the left upper lobe nodule she has multiple small groundglass lesion is that this is increased in size we feel this is probably another adenocarcinoma. We will schedule her electromagnetic navigational bronchoscopy for possible biopsy patient understand the risks and agrees to the biopsy  Current Outpatient Prescriptions  Medication Sig Dispense Refill  . Aspirin-Salicylamide-Caffeine (BC HEADACHE POWDER PO) Take 1 packet by mouth daily.        . atenolol (TENORMIN) 25 MG tablet TAKE ONE TABLET BY MOUTH TWICE DAILY  60 tablet  6  . calcium-vitamin D (OSCAL WITH D) 500-200 MG-UNIT per tablet Take 1 tablet by mouth daily.       . celecoxib (CELEBREX) 200 MG capsule Take 200 mg by mouth as needed.        . cholecalciferol (VITAMIN D) 1000 UNITS tablet Take 1,000 Units by mouth daily.        . diltiazem (CARDIZEM CD) 240 MG 24 hr capsule Take 1 capsule (240 mg total) by mouth daily.  30 capsule  0  . drospirenone-estradiol (ANGELIQ) 0.5-1 MG per tablet Take 1 tablet by mouth daily.  30 tablet  11  . esomeprazole (NEXIUM) 40 MG capsule Take 1 capsule (40 mg total) by mouth daily.  30 capsule  3  . famotidine (PEPCID) 40 MG tablet Take 1 tablet (40 mg total) by mouth at bedtime as needed for heartburn.  30 tablet  1  . hydrochlorothiazide 25 MG tablet Take 25 mg by mouth daily.        . multivitamin (THERAGRAN) tablet Take 1 tablet by mouth daily.           Review of Systems: Unchanged   Physical Exam lungs clear attestation percussion   Diagnostic Tests: CT scan shows an enlarging left upper lobe nodule   Impression: Adenocarcinoma of the lung status post right upper lobectomy enlarging left upper lobe nodule   Plan: Electromagnetic navigational bronchoscopy      

## 2011-09-02 ENCOUNTER — Encounter (HOSPITAL_COMMUNITY): Payer: Self-pay

## 2011-09-02 ENCOUNTER — Encounter (HOSPITAL_COMMUNITY)
Admission: RE | Admit: 2011-09-02 | Discharge: 2011-09-02 | Disposition: A | Discharge status: Home / Self Care | Payer: Medicare Other | Source: Ambulatory Visit | Attending: Anesthesiology | Admitting: Anesthesiology

## 2011-09-02 ENCOUNTER — Ambulatory Visit (HOSPITAL_COMMUNITY): Admission: RE | Admit: 2011-09-02 | Payer: Medicare Other | Source: Ambulatory Visit

## 2011-09-02 ENCOUNTER — Other Ambulatory Visit: Payer: Self-pay

## 2011-09-02 ENCOUNTER — Encounter (HOSPITAL_COMMUNITY)
Admission: RE | Admit: 2011-09-02 | Discharge: 2011-09-02 | Disposition: A | Discharge status: Home / Self Care | Payer: Medicare Other | Source: Ambulatory Visit | Attending: Thoracic Surgery | Admitting: Thoracic Surgery

## 2011-09-02 ENCOUNTER — Encounter (HOSPITAL_COMMUNITY): Payer: Self-pay | Admitting: Pharmacy Technician

## 2011-09-02 DIAGNOSIS — R0602 Shortness of breath: Secondary | ICD-10-CM | POA: Diagnosis not present

## 2011-09-02 DIAGNOSIS — Z902 Acquired absence of lung [part of]: Secondary | ICD-10-CM | POA: Diagnosis not present

## 2011-09-02 DIAGNOSIS — Z01818 Encounter for other preprocedural examination: Secondary | ICD-10-CM | POA: Diagnosis not present

## 2011-09-02 DIAGNOSIS — R918 Other nonspecific abnormal finding of lung field: Secondary | ICD-10-CM | POA: Diagnosis not present

## 2011-09-02 DIAGNOSIS — Z85118 Personal history of other malignant neoplasm of bronchus and lung: Secondary | ICD-10-CM | POA: Diagnosis not present

## 2011-09-02 DIAGNOSIS — Z01810 Encounter for preprocedural cardiovascular examination: Secondary | ICD-10-CM | POA: Diagnosis not present

## 2011-09-02 DIAGNOSIS — J984 Other disorders of lung: Secondary | ICD-10-CM | POA: Diagnosis not present

## 2011-09-02 DIAGNOSIS — D381 Neoplasm of uncertain behavior of trachea, bronchus and lung: Secondary | ICD-10-CM

## 2011-09-02 DIAGNOSIS — K449 Diaphragmatic hernia without obstruction or gangrene: Secondary | ICD-10-CM | POA: Diagnosis not present

## 2011-09-02 DIAGNOSIS — Z01812 Encounter for preprocedural laboratory examination: Secondary | ICD-10-CM | POA: Diagnosis not present

## 2011-09-02 HISTORY — DX: Other complications of anesthesia, initial encounter: T88.59XA

## 2011-09-02 HISTORY — DX: Adverse effect of unspecified anesthetic, initial encounter: T41.45XA

## 2011-09-02 LAB — COMPREHENSIVE METABOLIC PANEL
Alkaline Phosphatase: 54 U/L (ref 39–117)
BUN: 20 mg/dL (ref 6–23)
Chloride: 100 mEq/L (ref 96–112)
GFR calc Af Amer: 50 mL/min — ABNORMAL LOW (ref >90)
GFR calc non Af Amer: 43 mL/min — ABNORMAL LOW (ref >90)
Glucose, Bld: 102 mg/dL — ABNORMAL HIGH (ref 70–99)
Potassium: 3.4 mEq/L — ABNORMAL LOW (ref 3.5–5.1)
Total Bilirubin: 0.3 mg/dL (ref 0.3–1.2)
Total Protein: 7.1 g/dL (ref 6.0–8.3)

## 2011-09-02 LAB — CBC
HCT: 45 % (ref 36.0–46.0)
Hemoglobin: 15.7 g/dL — ABNORMAL HIGH (ref 12.0–15.0)
MCHC: 34.9 g/dL (ref 30.0–36.0)

## 2011-09-02 LAB — APTT: aPTT: 27 seconds (ref 24–37)

## 2011-09-02 LAB — PROTIME-INR: Prothrombin Time: 13.6 seconds (ref 11.6–15.2)

## 2011-09-02 NOTE — Pre-Procedure Instructions (Signed)
20 [name]  09/02/2011   Your procedure is scheduled on:  Sep 05, 2011  Report to [facility] at 5:30 AM.  Call this number if you have problems the morning of surgery: [phone]   Remember:   Do not eat food:After Midnight.  May have clear liquids: up to 4 Hours before arrival.  Clear liquids include soda, tea, black coffee, apple or grape juice, broth.  Take these medicines the morning of surgery with A SIP OF WATER: atenolol, cardizem, nexium   Do not wear jewelry, make-up or nail polish.  Do not wear lotions, powders, or perfumes. You may wear deodorant.  Do not shave 48 hours prior to surgery.  Do not bring valuables to the hospital.  Contacts, dentures or bridgework may not be worn into surgery.  Leave suitcase in the car. After surgery it may be brought to your room.  For patients admitted to the hospital, checkout time is 11:00 AM the day of discharge.   Patients discharged the day of surgery will not be allowed to drive home.  Name and phone number of your driver: NA  Special Instructions: CHG Shower Use Special Wash: 1/2 bottle night before surgery and 1/2 bottle morning of surgery.   Please read over the following fact sheets that you were given: Pain Booklet, MRSA Information and Surgical Site Infection Prevention

## 2011-09-05 ENCOUNTER — Other Ambulatory Visit: Payer: Self-pay | Admitting: Thoracic Surgery

## 2011-09-05 ENCOUNTER — Ambulatory Visit (HOSPITAL_COMMUNITY): Payer: Medicare Other | Admitting: Anesthesiology

## 2011-09-05 ENCOUNTER — Encounter (HOSPITAL_COMMUNITY)
Admission: RE | Disposition: A | Discharge status: Home / Self Care | Payer: Self-pay | Source: Ambulatory Visit | Attending: Thoracic Surgery

## 2011-09-05 ENCOUNTER — Encounter (HOSPITAL_COMMUNITY): Payer: Self-pay | Admitting: Anesthesiology

## 2011-09-05 ENCOUNTER — Ambulatory Visit (HOSPITAL_COMMUNITY)
Admission: RE | Admit: 2011-09-05 | Discharge: 2011-09-05 | Disposition: A | Discharge status: Home / Self Care | Payer: Medicare Other | Source: Ambulatory Visit | Attending: Thoracic Surgery | Admitting: Thoracic Surgery

## 2011-09-05 ENCOUNTER — Ambulatory Visit (HOSPITAL_COMMUNITY): Payer: Medicare Other

## 2011-09-05 DIAGNOSIS — Z01812 Encounter for preprocedural laboratory examination: Secondary | ICD-10-CM | POA: Insufficient documentation

## 2011-09-05 DIAGNOSIS — Z01810 Encounter for preprocedural cardiovascular examination: Secondary | ICD-10-CM | POA: Diagnosis not present

## 2011-09-05 DIAGNOSIS — J984 Other disorders of lung: Secondary | ICD-10-CM | POA: Diagnosis not present

## 2011-09-05 DIAGNOSIS — Z85118 Personal history of other malignant neoplasm of bronchus and lung: Secondary | ICD-10-CM | POA: Insufficient documentation

## 2011-09-05 DIAGNOSIS — D1430 Benign neoplasm of unspecified bronchus and lung: Secondary | ICD-10-CM | POA: Diagnosis not present

## 2011-09-05 DIAGNOSIS — Z902 Acquired absence of lung [part of]: Secondary | ICD-10-CM | POA: Diagnosis not present

## 2011-09-05 DIAGNOSIS — R911 Solitary pulmonary nodule: Secondary | ICD-10-CM | POA: Diagnosis not present

## 2011-09-05 DIAGNOSIS — R918 Other nonspecific abnormal finding of lung field: Secondary | ICD-10-CM | POA: Diagnosis not present

## 2011-09-05 DIAGNOSIS — D381 Neoplasm of uncertain behavior of trachea, bronchus and lung: Secondary | ICD-10-CM

## 2011-09-05 DIAGNOSIS — Z01818 Encounter for other preprocedural examination: Secondary | ICD-10-CM | POA: Insufficient documentation

## 2011-09-05 SURGERY — VIDEO BRONCHOSCOPY WITH ENDOBRONCHIAL NAVIGATION
Anesthesia: General | Site: Chest | Wound class: Clean Contaminated

## 2011-09-05 MED ORDER — ONDANSETRON HCL 4 MG/2ML IJ SOLN
INTRAMUSCULAR | Status: DC | PRN
  Administered 2011-09-05: 4 mg via INTRAVENOUS

## 2011-09-05 MED ORDER — ACETAMINOPHEN 650 MG RE SUPP: 650.0000 mg | RECTAL | Status: DC | PRN

## 2011-09-05 MED ORDER — MIDAZOLAM HCL 5 MG/5ML IJ SOLN
INTRAMUSCULAR | Status: DC | PRN
  Administered 2011-09-05: 1 mg via INTRAVENOUS

## 2011-09-05 MED ORDER — SODIUM CHLORIDE 0.9 % IJ SOLN: 3.0000 mL | INTRAMUSCULAR | Status: DC | PRN

## 2011-09-05 MED ORDER — KETOROLAC TROMETHAMINE 30 MG/ML IJ SOLN: 15.0000 mg | Freq: Once | INTRAMUSCULAR | Status: DC | PRN

## 2011-09-05 MED ORDER — FENTANYL CITRATE 0.05 MG/ML IJ SOLN
INTRAMUSCULAR | Status: DC | PRN
  Administered 2011-09-05: 100 ug via INTRAVENOUS
  Administered 2011-09-05: 50 ug via INTRAVENOUS

## 2011-09-05 MED ORDER — SODIUM CHLORIDE 0.9 % IJ SOLN: 3.0000 mL | Freq: Two times a day (BID) | INTRAMUSCULAR | Status: DC

## 2011-09-05 MED ORDER — PROPOFOL 10 MG/ML IV EMUL
INTRAVENOUS | Status: DC | PRN
  Administered 2011-09-05: 150 mg via INTRAVENOUS

## 2011-09-05 MED ORDER — OXYCODONE HCL 5 MG PO TABS: 5.0000 mg | ORAL_TABLET | ORAL | Status: DC | PRN

## 2011-09-05 MED ORDER — ONDANSETRON HCL 4 MG/2ML IJ SOLN: 4.0000 mg | Freq: Four times a day (QID) | INTRAMUSCULAR | Status: DC | PRN

## 2011-09-05 MED ORDER — NEOSTIGMINE METHYLSULFATE 1 MG/ML IJ SOLN
INTRAMUSCULAR | Status: DC | PRN
  Administered 2011-09-05: 3 mg via INTRAVENOUS

## 2011-09-05 MED ORDER — PROMETHAZINE HCL 25 MG/ML IJ SOLN: 12.5000 mg | Freq: Four times a day (QID) | INTRAMUSCULAR | Status: DC | PRN

## 2011-09-05 MED ORDER — GLYCOPYRROLATE 0.2 MG/ML IJ SOLN
INTRAMUSCULAR | Status: DC | PRN
  Administered 2011-09-05: .6 mg via INTRAVENOUS

## 2011-09-05 MED ORDER — PROMETHAZINE HCL 25 MG/ML IJ SOLN: 6.2500 mg | INTRAMUSCULAR | Status: DC | PRN

## 2011-09-05 MED ORDER — FENTANYL CITRATE 0.05 MG/ML IJ SOLN: 25.0000 ug | INTRAMUSCULAR | Status: DC | PRN

## 2011-09-05 MED ORDER — 0.9 % SODIUM CHLORIDE (POUR BTL) OPTIME
TOPICAL | Status: DC | PRN
  Administered 2011-09-05: 1000 mL

## 2011-09-05 MED ORDER — LACTATED RINGERS IV SOLN
INTRAVENOUS | Status: DC | PRN
  Administered 2011-09-05: 07:00:00 via INTRAVENOUS

## 2011-09-05 MED ORDER — SODIUM CHLORIDE 0.9 % IV SOLN: 250.0000 mL | INTRAVENOUS | Status: DC | PRN

## 2011-09-05 MED ORDER — ACETAMINOPHEN 325 MG PO TABS: 650.0000 mg | ORAL_TABLET | ORAL | Status: DC | PRN

## 2011-09-05 MED ORDER — ROCURONIUM BROMIDE 100 MG/10ML IV SOLN
INTRAVENOUS | Status: DC | PRN
  Administered 2011-09-05: 40 mg via INTRAVENOUS

## 2011-09-05 SURGICAL SUPPLY — 33 items
BRUSH SUPERTRAX BIOPSY (INSTRUMENTS) IMPLANT
BRUSH SUPERTRAX NDL-TIP CYTO (INSTRUMENTS) ×2 IMPLANT
CANISTER SUCTION 2500CC (MISCELLANEOUS) ×2 IMPLANT
CHANNEL WORK EXTEND EDGE 180 (KITS) IMPLANT
CHANNEL WORK EXTEND EDGE 45 (KITS) IMPLANT
CHANNEL WORK EXTEND EDGE 90 (KITS) IMPLANT
CLOTH BEACON ORANGE TIMEOUT ST (SAFETY) ×2 IMPLANT
CONT SPEC 4OZ CLIKSEAL STRL BL (MISCELLANEOUS) ×2 IMPLANT
COVER TABLE BACK 60X90 (DRAPES) ×2 IMPLANT
DRAPE C-ARM 42X72 X-RAY (DRAPES) IMPLANT
FILTER STRAW FLUID ASPIR (MISCELLANEOUS) IMPLANT
FORCEPS BIOP SUPERTRX PREMAR (INSTRUMENTS) ×2 IMPLANT
GLOVE ECLIPSE 6.5 STRL STRAW (GLOVE) ×2 IMPLANT
GLOVE SURG SIGNA 7.5 PF LTX (GLOVE) ×2 IMPLANT
GOWN STRL NON-REIN LRG LVL3 (GOWN DISPOSABLE) ×2 IMPLANT
KIT LOCATABLE GUIDE (CANNULA) IMPLANT
KIT MARKER FIDUCIAL DELIVERY (KITS) IMPLANT
KIT PROCEDURE EDGE 180 (KITS) IMPLANT
KIT PROCEDURE EDGE 45 (KITS) IMPLANT
KIT PROCEDURE EDGE 90 (KITS) ×2 IMPLANT
KIT ROOM TURNOVER OR (KITS) ×2 IMPLANT
MARKER SKIN DUAL TIP RULER LAB (MISCELLANEOUS) ×2 IMPLANT
NEEDLE SUPERTRX PREMARK BIOPSY (NEEDLE) ×2 IMPLANT
NS IRRIG 1000ML POUR BTL (IV SOLUTION) ×2 IMPLANT
OIL SILICONE PENTAX (PARTS (SERVICE/REPAIRS)) ×2 IMPLANT
PAD ARMBOARD 7.5X6 YLW CONV (MISCELLANEOUS) ×4 IMPLANT
PATCHES PATIENT (LABEL) ×2 IMPLANT
SPONGE GAUZE 4X4 12PLY (GAUZE/BANDAGES/DRESSINGS) ×2 IMPLANT
SYR 20ML ECCENTRIC (SYRINGE) ×2 IMPLANT
SYR 30ML LL (SYRINGE) ×2 IMPLANT
TOWEL OR 17X24 6PK STRL BLUE (TOWEL DISPOSABLE) ×2 IMPLANT
TRAP SPECIMEN MUCOUS 40CC (MISCELLANEOUS) ×2 IMPLANT
TUBE CONNECTING 12X1/4 (SUCTIONS) ×2 IMPLANT

## 2011-09-05 NOTE — Anesthesia Procedure Notes (Signed)
Procedure Name: Intubation Date/Time: Jun 04, 202013 7:41 AM Performed by: Gwenyth Allegra Pre-anesthesia Checklist: Patient identified, Timeout performed, Emergency Drugs available, Suction available and Patient being monitored Oxygen Delivery Method: Circle system utilized Preoxygenation: Pre-oxygenation with 100% oxygen Intubation Type: IV induction Ventilation: Mask ventilation without difficulty Laryngoscope Size: Mac and 3 Grade View: Grade I Tube size: 8.0 mm Number of attempts: 1 Airway Equipment and Method: Stylet Placement Confirmation: ETT inserted through vocal cords under direct vision,  positive ETCO2 and breath sounds checked- equal and bilateral Secured at: 21 cm Tube secured with: Tape Dental Injury: Teeth and Oropharynx as per pre-operative assessment

## 2011-09-05 NOTE — Interval H&P Note (Signed)
History and Physical Interval Note:  09/05/2011 7:18 AM  [name]  has presented today for surgery, with the diagnosis of LUNG LESION  The various methods of treatment have been discussed with the patient and family. After consideration of risks, benefits and other options for treatment, the patient has consented to  Procedure(s) (LRB): VIDEO BRONCHOSCOPY WITH ENDOBRONCHIAL NAVIGATION (N/A) as a surgical intervention .  The patients' history has been reviewed, patient examined, no change in status, stable for surgery.  I have reviewed the patients' chart and labs.  Questions were answered to the patient's satisfaction.     Cameron Proud

## 2011-09-05 NOTE — H&P (View-Only) (Signed)
HPI patient returns today after CT scan. CT scan shows some enlargement of the left upper lobe nodule she has multiple small groundglass lesion is that this is increased in size we feel this is probably another adenocarcinoma. We will schedule her electromagnetic navigational bronchoscopy for possible biopsy patient understand the risks and agrees to the biopsy  Current Outpatient Prescriptions  Medication Sig Dispense Refill  . Aspirin-Salicylamide-Caffeine (BC HEADACHE POWDER PO) Take 1 packet by mouth daily.        Marland Kitchen atenolol (TENORMIN) 25 MG tablet TAKE ONE TABLET BY MOUTH TWICE DAILY  60 tablet  6  . calcium-vitamin D (OSCAL WITH D) 500-200 MG-UNIT per tablet Take 1 tablet by mouth daily.       . celecoxib (CELEBREX) 200 MG capsule Take 200 mg by mouth as needed.        . cholecalciferol (VITAMIN D) 1000 UNITS tablet Take 1,000 Units by mouth daily.        Marland Kitchen diltiazem (CARDIZEM CD) 240 MG 24 hr capsule Take 1 capsule (240 mg total) by mouth daily.  30 capsule  0  . drospirenone-estradiol (ANGELIQ) 0.5-1 MG per tablet Take 1 tablet by mouth daily.  30 tablet  11  . esomeprazole (NEXIUM) 40 MG capsule Take 1 capsule (40 mg total) by mouth daily.  30 capsule  3  . famotidine (PEPCID) 40 MG tablet Take 1 tablet (40 mg total) by mouth at bedtime as needed for heartburn.  30 tablet  1  . hydrochlorothiazide 25 MG tablet Take 25 mg by mouth daily.        . multivitamin (THERAGRAN) tablet Take 1 tablet by mouth daily.           Review of Systems: Unchanged   Physical Exam lungs clear attestation percussion   Diagnostic Tests: CT scan shows an enlarging left upper lobe nodule   Impression: Adenocarcinoma of the lung status post right upper lobectomy enlarging left upper lobe nodule   Plan: Electromagnetic navigational bronchoscopy

## 2011-09-05 NOTE — Transfer of Care (Signed)
Immediate Anesthesia Transfer of Care Note  Patient: [name]  Procedure(s) Performed: Procedure(s) (LRB): VIDEO BRONCHOSCOPY WITH ENDOBRONCHIAL NAVIGATION (N/A)  Patient Location: PACU  Anesthesia Type: General  Level of Consciousness: awake, alert  and oriented  Airway & Oxygen Therapy: Patient Spontanous Breathing and Patient connected to nasal cannula oxygen  Post-op Assessment: Report given to PACU RN and Post -op Vital signs reviewed and stable  Post vital signs: Reviewed and stable  Complications: No apparent anesthesia complications

## 2011-09-05 NOTE — Op Note (Signed)
[name], [name] NO.:  [id number]  MEDICAL RECORD NO.:  [id number]  LOCATION:  [facility]                         FACILITY:  [facility]  PHYSICIAN:  [name], M.D. DATE OF BIRTH:  Jul 28, 1940  DATE OF PROCEDURE: DATE OF DISCHARGE:                              OPERATIVE REPORT   PREOPERATIVE DIAGNOSIS:  Status post right upper lobectomy for adenocarcinoma of right upper lobe and enlarging left upper lobe lesion.  POSTOPERATIVE DIAGNOSIS:  Status post right upper lobectomy for adenocarcinoma of right upper lobe and enlarging left upper lobe lesion.  OPERATION PERFORMED:  Fiberoptic bronchoscopy with endobronchial ultrasound.  SURGEON:  [name], MD.  ANESTHESIA:  General anesthesia.  DESCRIPTION OF PROCEDURE:  After percutaneous insertion of all monitor lines, the patient underwent general anesthesia, turned in left lateral thoracotomy position and he was returned to the supine position.  The video bronchoscope was passed through the endotracheal tube.  The carina was midline.  The right upper lobe stump was well healed.  The right middle lobe, right lower lobe orifices were normal.  The left mainstem bronchus was done, and the left upper lobe, left lower lobe orifices were normal.  The extended working channel with the locatable guide were inserted and we did automatic registration without difficulty.  We then navigated to the anterior segment of the left upper lobe, navigating to within a 0.9-1.2 cm of the lesion.  The extended working channel was locked in place, and then we rechecked it with locatable guide multiple times to keep within those parameters.  We first put 2 brushes through there as well as a needle aspiration without difficulty through the extended working channel.  The rapid on-site cytology revealed atypical cells, but was otherwise nondiagnostic.  We then proceeded to do 4 more brushes making sure we were still close to the  lesion, and then, finally did multiple biopsies of this area at least 8 biopsies of this area. After that was done, we did a BAL and then removed the extended working channel.  The patient tolerated the procedure well, was returned to recovery room in stable condition.     [name], M.D.     DPB/MEDQ  D:  09/05/2011  T:  09/05/2011  Job:  [id number]

## 2011-09-05 NOTE — Anesthesia Preprocedure Evaluation (Addendum)
Anesthesia Evaluation    Airway Mallampati: II  Neck ROM: Full    Dental  (+) Teeth Intact   Pulmonary  clear to auscultation        Cardiovascular Regular Normal    Neuro/Psych    GI/Hepatic   Endo/Other    Renal/GU      Musculoskeletal   Abdominal   Peds  Hematology   Anesthesia Other Findings   Reproductive/Obstetrics                           Anesthesia Physical Anesthesia Plan  ASA: III  Anesthesia Plan: General   Post-op Pain Management:    Induction: Intravenous  Airway Management Planned: Oral ETT  Additional Equipment:   Intra-op Plan:   Post-operative Plan: Extubation in OR  Informed Consent: I have reviewed the patients History and Physical, chart, labs and discussed the procedure including the risks, benefits and alternatives for the proposed anesthesia with the patient or authorized representative who has indicated his/her understanding and acceptance.   Dental advisory given  Plan Discussed with: CRNA and Surgeon  Anesthesia Plan Comments:        Anesthesia Quick Evaluation

## 2011-09-05 NOTE — Brief Op Note (Signed)
09/05/2011  8:35 AM  PATIENT:  [name]  71 y.o. female  PRE-OPERATIVE DIAGNOSIS:  Left Upper Lobe Nodule  POST-OPERATIVE DIAGNOSIS:  Left Upper Lobe Nodule  PROCEDURE:  Procedure(s) (LRB): VIDEO BRONCHOSCOPY WITH ENDOBRONCHIAL NAVIGATION (N/A)  SURGEON:  Surgeon(s) and Role:    * D Karle Plumber, MD - Primary  PHYSICIAN ASSISTANT:   ASSISTANTS: none   ANESTHESIA:   general  EBL:  Total I/O In: 700 [I.V.:700] Out: -   BLOOD ADMINISTERED:none  DRAINS: none   LOCAL MEDICATIONS USED:  NONE  SPECIMEN:  Aspirate  DISPOSITION OF SPECIMEN:  PATHOLOGY  COUNTS:  YES  TOURNIQUET:  * No tourniquets in log *  DICTATION: .Other Dictation: Dictation Number [id number]  PLAN OF CARE: Discharge to home after PACU  PATIENT DISPOSITION:  PACU - hemodynamically stable.   Delay start of Pharmacological VTE agent (>24hrs) due to surgical blood loss or risk of bleeding: yes

## 2011-09-05 NOTE — Anesthesia Postprocedure Evaluation (Signed)
  Anesthesia Post-op Note  Patient: [name]  Procedure(s) Performed: Procedure(s) (LRB): VIDEO BRONCHOSCOPY WITH ENDOBRONCHIAL NAVIGATION (N/A)  Patient Location: PACU  Anesthesia Type: General  Level of Consciousness: awake  Airway and Oxygen Therapy: Patient Spontanous Breathing  Post-op Pain: mild  Post-op Assessment: Post-op Vital signs reviewed  Post-op Vital Signs: stable  Complications: No apparent anesthesia complications

## 2011-09-05 NOTE — Preoperative (Signed)
Beta Blockers   Reason not to administer Beta Blockers:Not Applicable 

## 2011-09-06 ENCOUNTER — Ambulatory Visit (HOSPITAL_COMMUNITY)
Admission: RE | Admit: 2011-09-06 | Discharge: 2011-09-06 | Disposition: A | Discharge status: Home / Self Care | Payer: Medicare Other | Source: Ambulatory Visit | Attending: Thoracic Surgery | Admitting: Thoracic Surgery

## 2011-09-06 DIAGNOSIS — D381 Neoplasm of uncertain behavior of trachea, bronchus and lung: Secondary | ICD-10-CM

## 2011-09-07 ENCOUNTER — Other Ambulatory Visit: Payer: Self-pay | Admitting: Thoracic Surgery

## 2011-09-07 ENCOUNTER — Other Ambulatory Visit: Payer: Medicare Other

## 2011-09-07 ENCOUNTER — Encounter: Payer: Self-pay | Admitting: Thoracic Surgery

## 2011-09-07 ENCOUNTER — Ambulatory Visit: Payer: Medicare Other | Admitting: Thoracic Surgery

## 2011-09-07 ENCOUNTER — Ambulatory Visit (INDEPENDENT_AMBULATORY_CARE_PROVIDER_SITE_OTHER): Payer: Medicare Other | Admitting: Thoracic Surgery

## 2011-09-07 VITALS — BP 124/68 | HR 64 | Resp 18 | Ht 66.0 in | Wt 151.0 lb

## 2011-09-07 DIAGNOSIS — Z9889 Other specified postprocedural states: Secondary | ICD-10-CM

## 2011-09-07 DIAGNOSIS — D381 Neoplasm of uncertain behavior of trachea, bronchus and lung: Secondary | ICD-10-CM

## 2011-09-07 LAB — CULTURE, RESPIRATORY W GRAM STAIN: Culture: NO GROWTH

## 2011-09-07 NOTE — Progress Notes (Signed)
HPI patient returns for followup after a biopsy. The biopsies were nondiagnostic or negative. I still feel she probably needs to have a needle biopsy to be sure this is not cancer. We will schedule her for a needle biopsy next week. We discussed her discussed her options and she agrees to the needle biopsy. I I feel with the changing lesion that we need to be aggressive. I will see her back after needle biopsy.   Current Outpatient Prescriptions  Medication Sig Dispense Refill  . Aspirin-Salicylamide-Caffeine (BC HEADACHE POWDER PO) Take 1 packet by mouth 3 (three) times daily.       Marland Kitchen atenolol (TENORMIN) 25 MG tablet Take 25 mg by mouth daily.      . calcium-vitamin D (OSCAL WITH D) 500-200 MG-UNIT per tablet Take 1 tablet by mouth daily.       . celecoxib (CELEBREX) 200 MG capsule Take 200 mg by mouth daily as needed. For pain      . cholecalciferol (VITAMIN D) 1000 UNITS tablet Take 1,000 Units by mouth daily.        Marland Kitchen diltiazem (CARDIZEM CD) 240 MG 24 hr capsule Take 1 capsule (240 mg total) by mouth daily.  30 capsule  0  . diltiazem (DILACOR XR) 240 MG 24 hr capsule Take 1 capsule by mouth Daily.      . drospirenone-estradiol (ANGELIQ) 0.5-1 MG per tablet Take 1 tablet by mouth daily.  30 tablet  11  . esomeprazole (NEXIUM) 40 MG capsule Take 1 capsule (40 mg total) by mouth daily.  30 capsule  3  . famotidine (PEPCID) 40 MG tablet Take 40 mg by mouth at bedtime as needed. For upset stomach      . hydrochlorothiazide 25 MG tablet Take 25 mg by mouth daily.        . Multiple Vitamin (MULITIVITAMIN WITH MINERALS) TABS Take 1 tablet by mouth daily.         Review of Systems: No change   Physical Exam lungs are clear attestation percussion   Diagnostic Tests: But multiple biopsies left upper lobe nodule is an is negative   Impression: Enlarging left upper lobe nodule history of of adenocarcinoma in situ   Plan: Needle biopsy is scheduled

## 2011-09-08 ENCOUNTER — Other Ambulatory Visit: Payer: Self-pay | Admitting: Radiology

## 2011-09-09 ENCOUNTER — Other Ambulatory Visit: Payer: Self-pay | Admitting: Radiology

## 2011-09-15 ENCOUNTER — Ambulatory Visit (HOSPITAL_COMMUNITY): Admission: RE | Admit: 2011-09-15 | Payer: Medicare Other | Source: Ambulatory Visit

## 2011-09-15 ENCOUNTER — Inpatient Hospital Stay (HOSPITAL_COMMUNITY)
Admission: RE | Admit: 2011-09-15 | Discharge: 2011-09-16 | DRG: 181 | Disposition: A | Discharge status: Home / Self Care | Payer: Medicare Other | Source: Ambulatory Visit | Attending: Interventional Radiology | Admitting: Interventional Radiology

## 2011-09-15 ENCOUNTER — Ambulatory Visit (HOSPITAL_COMMUNITY)
Admission: RE | Admit: 2011-09-15 | Discharge: 2011-09-15 | Disposition: A | Discharge status: Home / Self Care | Payer: Medicare Other | Source: Ambulatory Visit | Attending: Thoracic Surgery | Admitting: Thoracic Surgery

## 2011-09-15 ENCOUNTER — Encounter (HOSPITAL_COMMUNITY): Payer: Self-pay

## 2011-09-15 ENCOUNTER — Ambulatory Visit (HOSPITAL_COMMUNITY)
Admission: RE | Admit: 2011-09-15 | Discharge: 2011-09-15 | Disposition: A | Discharge status: Home / Self Care | Payer: Medicare Other | Source: Ambulatory Visit | Attending: Interventional Radiology | Admitting: Interventional Radiology

## 2011-09-15 DIAGNOSIS — Z79899 Other long term (current) drug therapy: Secondary | ICD-10-CM

## 2011-09-15 DIAGNOSIS — Z888 Allergy status to other drugs, medicaments and biological substances status: Secondary | ICD-10-CM

## 2011-09-15 DIAGNOSIS — C3410 Malignant neoplasm of upper lobe, unspecified bronchus or lung: Secondary | ICD-10-CM | POA: Diagnosis not present

## 2011-09-15 DIAGNOSIS — R251 Tremor, unspecified: Secondary | ICD-10-CM

## 2011-09-15 DIAGNOSIS — J069 Acute upper respiratory infection, unspecified: Secondary | ICD-10-CM

## 2011-09-15 DIAGNOSIS — Z85820 Personal history of malignant melanoma of skin: Secondary | ICD-10-CM | POA: Diagnosis not present

## 2011-09-15 DIAGNOSIS — Z87891 Personal history of nicotine dependence: Secondary | ICD-10-CM | POA: Diagnosis not present

## 2011-09-15 DIAGNOSIS — Z880 Allergy status to penicillin: Secondary | ICD-10-CM

## 2011-09-15 DIAGNOSIS — D381 Neoplasm of uncertain behavior of trachea, bronchus and lung: Secondary | ICD-10-CM

## 2011-09-15 DIAGNOSIS — K2960 Other gastritis without bleeding: Secondary | ICD-10-CM

## 2011-09-15 DIAGNOSIS — K219 Gastro-esophageal reflux disease without esophagitis: Secondary | ICD-10-CM | POA: Diagnosis not present

## 2011-09-15 DIAGNOSIS — I447 Left bundle-branch block, unspecified: Secondary | ICD-10-CM

## 2011-09-15 DIAGNOSIS — Z85118 Personal history of other malignant neoplasm of bronchus and lung: Secondary | ICD-10-CM

## 2011-09-15 DIAGNOSIS — I10 Essential (primary) hypertension: Secondary | ICD-10-CM

## 2011-09-15 DIAGNOSIS — R911 Solitary pulmonary nodule: Secondary | ICD-10-CM | POA: Diagnosis not present

## 2011-09-15 DIAGNOSIS — Y849 Medical procedure, unspecified as the cause of abnormal reaction of the patient, or of later complication, without mention of misadventure at the time of the procedure: Secondary | ICD-10-CM | POA: Diagnosis not present

## 2011-09-15 DIAGNOSIS — J95811 Postprocedural pneumothorax: Secondary | ICD-10-CM

## 2011-09-15 DIAGNOSIS — E042 Nontoxic multinodular goiter: Secondary | ICD-10-CM | POA: Diagnosis not present

## 2011-09-15 DIAGNOSIS — Y9210 Unspecified residential institution as the place of occurrence of the external cause: Secondary | ICD-10-CM | POA: Diagnosis not present

## 2011-09-15 DIAGNOSIS — T39395A Adverse effect of other nonsteroidal anti-inflammatory drugs [NSAID], initial encounter: Secondary | ICD-10-CM

## 2011-09-15 DIAGNOSIS — C801 Malignant (primary) neoplasm, unspecified: Secondary | ICD-10-CM

## 2011-09-15 DIAGNOSIS — R1011 Right upper quadrant pain: Secondary | ICD-10-CM

## 2011-09-15 DIAGNOSIS — R042 Hemoptysis: Secondary | ICD-10-CM | POA: Diagnosis not present

## 2011-09-15 DIAGNOSIS — J9383 Other pneumothorax: Secondary | ICD-10-CM | POA: Diagnosis not present

## 2011-09-15 DIAGNOSIS — E0590 Thyrotoxicosis, unspecified without thyrotoxic crisis or storm: Secondary | ICD-10-CM

## 2011-09-15 LAB — CBC
HCT: 42.5 % (ref 36.0–46.0)
MCV: 89.5 fL (ref 78.0–100.0)
RBC: 4.75 MIL/uL (ref 3.87–5.11)
WBC: 7.7 10*3/uL (ref 4.0–10.5)

## 2011-09-15 MED ORDER — FAMOTIDINE 40 MG PO TABS
40.0000 mg | ORAL_TABLET | Freq: Every evening | ORAL | Status: DC | PRN
  Filled 2011-09-15: qty 1

## 2011-09-15 MED ORDER — CALCIUM CARBONATE-VITAMIN D 500-200 MG-UNIT PO TABS
1.0000 | ORAL_TABLET | Freq: Every day | ORAL | Status: DC
  Filled 2011-09-15 (×2): qty 1

## 2011-09-15 MED ORDER — VITAMIN D3 25 MCG (1000 UNIT) PO TABS
1000.0000 [IU] | ORAL_TABLET | Freq: Every day | ORAL | Status: DC
  Filled 2011-09-15 (×2): qty 1

## 2011-09-15 MED ORDER — MIDAZOLAM HCL 5 MG/5ML IJ SOLN
INTRAMUSCULAR | Status: AC | PRN
  Administered 2011-09-15 (×2): 1 mg via INTRAVENOUS

## 2011-09-15 MED ORDER — DROSPIRENONE-ESTRADIOL 0.5-1 MG PO TABS: 1.0000 | ORAL_TABLET | Freq: Every day | ORAL | Status: DC

## 2011-09-15 MED ORDER — DILTIAZEM HCL ER COATED BEADS 240 MG PO CP24
240.0000 mg | ORAL_CAPSULE | Freq: Every day | ORAL | Status: DC
  Filled 2011-09-15 (×2): qty 1

## 2011-09-15 MED ORDER — SODIUM CHLORIDE 0.9 % IV SOLN: Freq: Once | INTRAVENOUS | Status: DC

## 2011-09-15 MED ORDER — MIDAZOLAM HCL 2 MG/2ML IJ SOLN
INTRAMUSCULAR | Status: AC
  Filled 2011-09-15: qty 4

## 2011-09-15 MED ORDER — CELECOXIB 200 MG PO CAPS
200.0000 mg | ORAL_CAPSULE | Freq: Every day | ORAL | Status: DC | PRN
  Filled 2011-09-15: qty 1

## 2011-09-15 MED ORDER — ATENOLOL 25 MG PO TABS
25.0000 mg | ORAL_TABLET | Freq: Two times a day (BID) | ORAL | Status: DC
  Administered 2011-09-15: 25 mg via ORAL
  Filled 2011-09-15 (×3): qty 1

## 2011-09-15 MED ORDER — FENTANYL CITRATE 0.05 MG/ML IJ SOLN
INTRAMUSCULAR | Status: AC | PRN
  Administered 2011-09-15 (×2): 25 ug via INTRAVENOUS
  Administered 2011-09-15: 50 ug via INTRAVENOUS

## 2011-09-15 MED ORDER — ADULT MULTIVITAMIN W/MINERALS CH
1.0000 | ORAL_TABLET | Freq: Every day | ORAL | Status: DC
  Filled 2011-09-15 (×2): qty 1

## 2011-09-15 MED ORDER — FENTANYL CITRATE 0.05 MG/ML IJ SOLN
INTRAMUSCULAR | Status: AC
  Filled 2011-09-15: qty 4

## 2011-09-15 MED ORDER — HYDROCHLOROTHIAZIDE 25 MG PO TABS
25.0000 mg | ORAL_TABLET | Freq: Every day | ORAL | Status: DC
  Filled 2011-09-15 (×2): qty 1

## 2011-09-15 NOTE — Progress Notes (Signed)
Pt taken to 5150. Report given to pt's nurse at bedside.

## 2011-09-15 NOTE — Progress Notes (Signed)
Pt's nurse still unable to take report.  To call me back.

## 2011-09-15 NOTE — Progress Notes (Signed)
Pt's nurse is in report now and is unable to take report on this pt and will call me back.  Pt and her husband given dinner trays and voice understanding of the delays.

## 2011-09-15 NOTE — H&P (Signed)
Agree 

## 2011-09-15 NOTE — Progress Notes (Signed)
Attempted to call report to [phone].  Pt's nurse unable to take report and will call me back.

## 2011-09-15 NOTE — Procedures (Signed)
Procedure:  CT guided core biopsy of LUL lung nodule Findings:  18 G core biopsy x 2 via 17 G needle.  Parenchymal hemorrhage around nodule.  No PTX.

## 2011-09-15 NOTE — H&P (Signed)
Patient s/p left lung mass biopsy today via CT guidance with subsequent small asymptomatic left apical pneumothorax. VSS AF . Pateint denies chest pain or dyspnea;  Chest- few left basilar crackles, decreased BS left upper lung;  Card- RRR; abd- soft, NT, + BS; will plan overnight observation and recheck f/u CXR in am. Dr. Deanne Coffer to notify Dr. Edwyna Shell of  patient's admission. See H&P from earlier today for further details.

## 2011-09-15 NOTE — Discharge Instructions (Signed)
Lung Biopsy A Lung Biopsy involves taking a small piece of lung tissue so it can be examined under a microscope by a pathologist to help diagnose many different lung disorders. This test can be done in a variety of ways. One way is to open the chest during surgery and remove a piece of lung tissue. It can also be done through a bronchoscope (a pencil-sized telescope) which allows the caregiver to remove a piece of tissue through your trachea (the large breathing tube to your lungs). Another procedure is a needle biopsy of the lung, which involves inserting a needle into the lung and removing a small piece of tissue. POSSIBLE COMPLICATIONS INCLUDE:  Collapse of the lung   Bleeding   Infection  PREPARATION FOR TEST No preparation or fasting is necessary. NORMAL FINDINGS No evidence of pathology. Ranges for normal findings may vary among different laboratories and hospitals. You should always check with your doctor after having lab work or other tests done to discuss the meaning of your test results and whether your values are considered within normal limits. MEANING OF TEST  Your caregiver will go over the test results with you and discuss the importance and meaning of your results, as well as treatment options and the need for additional tests if necessary. OBTAINING THE TEST RESULTS It is your responsibility to obtain your test results. Ask the lab or department performing the test when and how you will get your results. Document Released: 09/15/2004 Document Revised: 06/16/2011 Document Reviewed: 06/07/2008 [facility] Patient Information 2012 [place], [place].Needle Biopsy of Lung Care After A needle biopsy is a procedure to get a sample of cells from your body for testing. A needle biopsy may be used to take tissue or fluid samples from muscles, bones and organs, such as the liver or lungs. The sample from your needle biopsy may help your doctor determine what is causing:  A mass or lump. A  needle biopsy may reveal whether a mass or lump is a cyst, an infection, a benign tumor or cancer.   Infection. Tests from a needle biopsy can help doctors determine what germs are causing an infection so that the best medicines can be used for treatment.   Inflammation. Looking closely at a needle biopsy sample may reveal what is causing inflammation and what types of cells are involved.  Your caregiver has now completed a needle biopsy of the lung to help diagnose a medical condition or to rule out a disease or condition. A needle biopsy may also be used to assess the progress of a treatment. AFTER THE PROCEDURE Once your caregiver has collected enough cells or tissue for analysis, your needle biopsy procedure is complete. Your biopsy sample is sent to a laboratory to be tested. The results may be available in a day or two. More technical tests may require more time. Ask your caregiver how long you can expect to wait.  Your health care team may apply a bandage over the areas where the needle was inserted. You may be asked to apply pressure to the bandage for several minutes to ensure there is minimal bleeding. In most cases, you can leave when your needle biopsy procedure is completed. Do not drive yourself home. Someone else should take you home. Whether you can leave right away or whether you will need to stay for observation depends on how you feel and the exam by your caregiver after the biopsy. In some cases your health care team may want to observe you  for a few hours to ensure you do not have complications from your biopsy. If you received an IV sedative or general anesthetic, you will be taken to a comfortable place to relax while the medication wears off. Expect to take it easy for the rest of the day. Protect the area where you received the needle biopsy by keeping the bandage in place for as long as instructed. You may feel some mild pain or discomfort in the area, but this should stop in a  day or two. Only take over-the-counter or prescription medicines for pain, discomfort, or fever as directed by your caregiver. SEEK MEDICAL CARE IF:   You have pain at the biopsy site that worsens or is not helped by medicines.   You have swelling at the needle biopsy site.   You have drainage from the biopsy site.   You have new or unusual pain in your back or at the top of one or both shoulders.  SEEK IMMEDIATE MEDICAL CARE IF:   You have a fever.   You develop lightheadedness or fainting.   You have chest pain or shortness of breath.   You have bleeding that does not stop with pressure or a bandage.   You have weakness or numbness in your legs.  Document Released: 04/24/2007 Document Revised: 06/16/2011 Document Reviewed: 04/24/2007 [facility] Patient Information 2012 [place], [place].

## 2011-09-15 NOTE — H&P (Signed)
[name] is an 71 y.o. female.   Chief Complaint: Hx lung ca; rt lobectomy 2010; left lung lesion- neg bronch; Hx melanoma HPI: scheduled for left lung lesion needle biopsy in IR  Past Medical History  Diagnosis Date  . LBBB (left bundle branch block)   . Hypertension   . Hyperthyroidism     dr Everardo All- radioactive iodine treatment 2011- resolved  . Tremor     hand- intention tremor  . Cancer 1-10    lung- right non small cell, adenocarcinoma w/ broncho- alveolar features  . Melanoma     left leg  . Complication of anesthesia     difficulty awakening, dysrhythmia post surgery    Past Surgical History  Procedure Date  . Tonsillectomy   . Conization for cervical dysplasia   . Rul lobectomy 2010    Family History  Problem Relation Age of Onset  . Asthma Mother   . Heart disease Mother   . Thyroid disease Daughter     hypothyroidism  . Cancer Maternal Uncle     lung  . Cancer Maternal Grandfather     lung  . Diabetes Neg Hx   . Coronary artery disease Neg Hx    Social History:  reports that she has quit smoking. Her smoking use included Cigarettes. She has a 15 pack-year smoking history. She has never used smokeless tobacco. She reports that she drinks alcohol. She reports that she does not use illicit drugs.  Allergies:  Allergies  Allergen Reactions  . Meperidine Hcl Anaphylaxis  . Morphine Other (See Comments)    Stopped breathing  . Penicillins Other (See Comments)    Stopped breathing  . Oxycodone-Acetaminophen Itching       . Propoxyphene N-Acetaminophen Nausea And Vomiting  . Tramadol Hcl Nausea And Vomiting    Medications Prior to Admission  Medication Sig Dispense Refill  . Aspirin-Salicylamide-Caffeine (BC HEADACHE POWDER PO) Take 1 packet by mouth 3 (three) times daily.       Marland Kitchen atenolol (TENORMIN) 25 MG tablet Take 25 mg by mouth 2 (two) times daily.       . calcium-vitamin D (OSCAL WITH D) 500-200 MG-UNIT per tablet Take 1 tablet by mouth  daily.       . celecoxib (CELEBREX) 200 MG capsule Take 200 mg by mouth daily as needed. For pain      . cholecalciferol (VITAMIN D) 1000 UNITS tablet Take 1,000 Units by mouth daily.       Marland Kitchen diltiazem (CARDIZEM CD) 240 MG 24 hr capsule Take 1 capsule (240 mg total) by mouth daily.  30 capsule  0  . drospirenone-estradiol (ANGELIQ) 0.5-1 MG per tablet Take 1 tablet by mouth daily.  30 tablet  11  . esomeprazole (NEXIUM) 40 MG capsule Take 1 capsule (40 mg total) by mouth daily.  30 capsule  3  . famotidine (PEPCID) 40 MG tablet Take 40 mg by mouth at bedtime as needed. For upset stomach      . hydrochlorothiazide 25 MG tablet Take 25 mg by mouth daily.       . Multiple Vitamin (MULITIVITAMIN WITH MINERALS) TABS Take 1 tablet by mouth daily.       Medications Prior to Admission  Medication Dose Route Frequency Provider Last Rate Last Dose  . 0.9 %  sodium chloride infusion   Intravenous Once Robet Leu, PA        No results found for this or any previous visit (from the past  48 hour(s)). No results found.  Review of Systems  Constitutional: Negative for fever.  Respiratory: Negative for cough.   Cardiovascular: Negative for chest pain.  Gastrointestinal: Negative for nausea, vomiting and abdominal pain.  Neurological: Negative for headaches.    Blood pressure 173/85, pulse 71, temperature 97.4 F (36.3 C), temperature source Oral, resp. rate 18, height 5\' 5"  (1.651 m), weight 150 lb (68.04 kg), SpO2 96.00%. Physical Exam  Constitutional: She is oriented to person, place, and time. She appears well-developed and well-nourished.  HENT:  Head: Normocephalic.  Eyes: EOM are normal.  Neck: Normal range of motion.  Cardiovascular: Normal rate, regular rhythm and normal heart sounds.   No murmur heard. Respiratory: Effort normal and breath sounds normal. She has no wheezes.  GI: Soft. Bowel sounds are normal. There is no tenderness.  Musculoskeletal: Normal range of motion.    Neurological: She is alert and oriented to person, place, and time.  Skin: Skin is warm and dry.     Assessment/Plan Hx lung ca; left lung mass-neg bronch Scheduled now for needle biopsy in IR Pt aware of procedure benefits and risks and agreeable to proceed. Consent signed  Hannan Hutmacher A 09/15/2011, 10:11 AM

## 2011-09-16 ENCOUNTER — Inpatient Hospital Stay (HOSPITAL_COMMUNITY): Payer: Medicare Other

## 2011-09-16 ENCOUNTER — Telehealth (HOSPITAL_COMMUNITY): Payer: Self-pay

## 2011-09-16 DIAGNOSIS — R911 Solitary pulmonary nodule: Secondary | ICD-10-CM | POA: Diagnosis not present

## 2011-09-16 DIAGNOSIS — J9383 Other pneumothorax: Secondary | ICD-10-CM | POA: Diagnosis not present

## 2011-09-16 NOTE — Discharge Summary (Signed)
Physician Discharge Summary  Patient ID: [name] MRN: [id number] DOB/AGE: Oct 18, 1940 71 y.o.  Admit date: 09/15/2011 Discharge date: 09/16/2011  Admission Diagnoses: Lung Ca; new left lung mass; post biopsy pneumothorax (L)  Discharge Diagnoses: same Active Problems:  Pneumothorax of left lung after biopsy   Discharged Condition: stable; improved  Hospital Course: Pt with Hx of lung Ca and melanoma. Developed new left lung mass. Bronchoscopy by Dr Edwyna Shell was negative but doctor was still suspicious of lesion.  We were asked to needle biopsy left lung mass on 3/7.  Biopsy was performed and pt tolerated well. No complications at procedure. Small PTX was noted on post biopsy CXR. Pt was asymptomatic at that time. Repeat CXR one hr later showed PTX increasing slightly. Pt still asymptomatic but we chose to admit overnight for observation.  Overnight stay was without incident. Slept well, eating well. No SOB or pain. CXR 3/8 am shows stable or smaller PTX. DC now per DRs Shick and Reunion Pt aware and agreeable.  Consults: None  Significant Diagnostic Studies: Left lung mass biopsy; CXRs  Treatments: none  Discharge Exam: Blood pressure 155/63, pulse 72, temperature 97.7 F (36.5 C), temperature source Oral, resp. rate 16, height 5\' 5"  (1.651 m), weight 150 lb (68.04 kg), SpO2 95.00%.  PE:  Heart: RRR w/o M Lungs: CTA Abd: soft; NT; No masses; +BS Chest site: sl tender to palpate; no bleeding; no sign of infection Extr: FROM; gait steady Neuro: intact  Results for orders placed during the hospital encounter of 09/15/11  APTT      Component Value Range   aPTT 25  24 - 37 (seconds)  CBC      Component Value Range   WBC 7.7  4.0 - 10.5 (K/uL)   RBC 4.75  3.87 - 5.11 (MIL/uL)   Hemoglobin 14.9  12.0 - 15.0 (g/dL)   HCT 40.9  81.1 - 91.4 (%)   MCV 89.5  78.0 - 100.0 (fL)   MCH 31.4  26.0 - 34.0 (pg)   MCHC 35.1  30.0 - 36.0 (g/dL)   RDW 78.2  95.6 - 21.3 (%)   Platelets  205  150 - 400 (K/uL)  PROTIME-INR      Component Value Range   Prothrombin Time 13.0  11.6 - 15.2 (seconds)   INR 0.96  0.00 - 1.49     Disposition: Lung Ca Hx; new left lung mass. Bx 3/7; post pneumothorax Overnight stay for observation Pt stable; 3/8 cxr stable or improved Will dc to home now Dr Miles Costain and Dr Fredia Sorrow aware of pt status. DC instructions home with pt with good understanding Pt to follow with Dr Edwyna Shell Dr Edwyna Shell also aware of pt overnight stay- he has seen pt today  Discharge Orders    Future Appointments: Provider: Department: Dept Phone: Center:   10/04/2011 3:00 PM Gwenith Spitz [place] [facility] [phone] None   10/04/2011 3:30 PM Mohamed K. Arbutus Ped, MD [facility] [phone] None     Future Orders Please Complete By Expires   Diet - low sodium heart healthy      Increase activity slowly      Discharge instructions      Comments:   Restful x 2-3 days; call [phone] with questions or problems; return to ER if pain or SOB   Lifting restrictions      Comments:   Do not lift over 10 lbs x 2-3 days   Remove dressing in 24 hours  Call MD for:  severe uncontrolled pain      Call MD for:  redness, tenderness, or signs of infection (pain, swelling, redness, odor or green/yellow discharge around incision site)      Call MD for:  difficulty breathing, headache or visual disturbances        Medication List  As of 09/16/2011  9:34 AM   TAKE these medications         atenolol 25 MG tablet   Commonly known as: TENORMIN   Take 25 mg by mouth 2 (two) times daily.      BC HEADACHE POWDER PO   Take 1 packet by mouth 3 (three) times daily.      calcium-vitamin D 500-200 MG-UNIT per tablet   Commonly known as: OSCAL WITH D   Take 1 tablet by mouth daily.      celecoxib 200 MG capsule   Commonly known as: CELEBREX   Take 200 mg by mouth daily as needed. For pain      cholecalciferol 1000 UNITS tablet   Commonly known as: VITAMIN D   Take 1,000 Units  by mouth daily.      diltiazem 240 MG 24 hr capsule   Commonly known as: CARDIZEM CD   Take 1 capsule (240 mg total) by mouth daily.      drospirenone-estradiol 0.5-1 MG per tablet   Commonly known as: ANGELIQ   Take 1 tablet by mouth daily.      esomeprazole 40 MG capsule   Commonly known as: NEXIUM   Take 1 capsule (40 mg total) by mouth daily.      famotidine 40 MG tablet   Commonly known as: PEPCID   Take 40 mg by mouth at bedtime as needed. For upset stomach      hydrochlorothiazide 25 MG tablet   Commonly known as: HYDRODIURIL   Take 25 mg by mouth daily.      mulitivitamin with minerals Tabs   Take 1 tablet by mouth daily.             SignedRalene Muskrat A 09/16/2011, 9:34 AM

## 2011-09-16 NOTE — Discharge Summary (Signed)
Agree.  Stable small PTX.  Patient doing well with no complaints.  Follow up with Dr. Edwyna Shell after biopsy results finalized.

## 2011-09-16 NOTE — Progress Notes (Signed)
Discharge home. Home discharge instruction given, no question verbalized, Alert and oriented, denies pain, not in any distress, ambulatory.

## 2011-09-16 NOTE — Progress Notes (Signed)
                                                  Subjective: Patient had hemorhage and pneumothorax after needle biopsy. The pneumothorax is stable. Should be discharged today. Will follow up in office.  Objective: Vital signs in last 24 hours: Temp:  [97 F (36.1 C)-97.7 F (36.5 C)] 97.7 F (36.5 C) (03/08 0532) Pulse Rate:  [56-83] 72  (03/08 0532) Cardiac Rhythm:  [-] Normal sinus rhythm (03/07 1250) Resp:  [10-20] 16  (03/08 0532) BP: (138-175)/(52-85) 155/63 mmHg (03/08 0532) SpO2:  [92 %-99 %] 95 % (03/08 0532) Weight:  [68.04 kg (150 lb)] 68.04 kg (150 lb) (03/07 0945)  Hemodynamic parameters for last 24 hours:    Intake/Output from previous day:   Intake/Output this shift:    General appearance: alert  Lab Results:  Basename 09/15/11 0954  WBC 7.7  HGB 14.9  HCT 42.5  PLT 205   BMET: No results found for this basename: NA:2,K:2,CL:2,CO2:2,GLUCOSE:2,BUN:2,CREATININE:2,CALCIUM:2 in the last 72 hours  PT/INR:  [facility] 09/15/11 0954  LABPROT 13.0  INR 0.96   ABG    Component Value Date/Time   PHART 7.459* 08/22/2008 0559   HCO3 24.2* 08/22/2008 0559   TCO2 25 08/22/2008 0559   O2SAT 98.0 08/22/2008 0559   CBG (last 3)  No results found for this basename: GLUCAP:3 in the last 72 hours  Assessment/Plan: S/P   Will followup in office with CXR.   LOS: 1 day    [name] PATRICK 09/16/2011

## 2011-09-19 ENCOUNTER — Other Ambulatory Visit: Payer: Self-pay | Admitting: Thoracic Surgery

## 2011-09-19 ENCOUNTER — Other Ambulatory Visit: Payer: Self-pay | Admitting: Cardiology

## 2011-09-19 DIAGNOSIS — D381 Neoplasm of uncertain behavior of trachea, bronchus and lung: Secondary | ICD-10-CM

## 2011-09-20 ENCOUNTER — Encounter: Payer: Self-pay | Admitting: Thoracic Surgery

## 2011-09-20 ENCOUNTER — Ambulatory Visit
Admission: RE | Admit: 2011-09-20 | Discharge: 2011-09-20 | Disposition: A | Discharge status: Home / Self Care | Payer: Medicare Other | Source: Ambulatory Visit | Attending: Thoracic Surgery | Admitting: Thoracic Surgery

## 2011-09-20 ENCOUNTER — Ambulatory Visit (INDEPENDENT_AMBULATORY_CARE_PROVIDER_SITE_OTHER): Payer: Medicare Other | Admitting: Thoracic Surgery

## 2011-09-20 VITALS — BP 153/76 | HR 63 | Resp 18 | Ht 66.0 in | Wt 151.0 lb

## 2011-09-20 DIAGNOSIS — Z9889 Other specified postprocedural states: Secondary | ICD-10-CM

## 2011-09-20 DIAGNOSIS — R918 Other nonspecific abnormal finding of lung field: Secondary | ICD-10-CM | POA: Diagnosis not present

## 2011-09-20 DIAGNOSIS — D381 Neoplasm of uncertain behavior of trachea, bronchus and lung: Secondary | ICD-10-CM

## 2011-09-20 DIAGNOSIS — J95811 Postprocedural pneumothorax: Secondary | ICD-10-CM | POA: Diagnosis not present

## 2011-09-20 NOTE — Progress Notes (Signed)
HPI patient returns for followup. Needle biopsy showed adenocarcinoma. Patient was informed of the diagnosis. We will have her seen in our oncology clinic for treatment. We will send the specimen off for a analysis. Chest x-ray showed a 5% pneumothorax which is improved from the 15%   Current Outpatient Prescriptions  Medication Sig Dispense Refill  . Aspirin-Salicylamide-Caffeine (BC HEADACHE POWDER PO) Take 1 packet by mouth 3 (three) times daily.       Marland Kitchen atenolol (TENORMIN) 25 MG tablet Take 25 mg by mouth 2 (two) times daily.       . calcium-vitamin D (OSCAL WITH D) 500-200 MG-UNIT per tablet Take 1 tablet by mouth daily.       . celecoxib (CELEBREX) 200 MG capsule Take 200 mg by mouth daily as needed. For pain      . cholecalciferol (VITAMIN D) 1000 UNITS tablet Take 1,000 Units by mouth daily.       Marland Kitchen diltiazem (CARDIZEM CD) 240 MG 24 hr capsule Take 1 capsule (240 mg total) by mouth daily.  30 capsule  0  . drospirenone-estradiol (ANGELIQ) 0.5-1 MG per tablet Take 1 tablet by mouth daily.  30 tablet  11  . esomeprazole (NEXIUM) 40 MG capsule Take 1 capsule (40 mg total) by mouth daily.  30 capsule  3  . famotidine (PEPCID) 40 MG tablet Take 40 mg by mouth at bedtime as needed. For upset stomach      . hydrochlorothiazide (HYDRODIURIL) 25 MG tablet TAKE ONE (1) TABLET(S) EVERY MORNING  90 tablet  0  . Multiple Vitamin (MULITIVITAMIN WITH MINERALS) TABS Take 1 tablet by mouth daily.         Review of Systems: Unchanged   Physical Exam lungs are clear to auscultation percussion  Diagnostic Tests: Chest x-ray shows a 5% pneumothorax which is improved thursday

## 2011-09-22 ENCOUNTER — Encounter: Payer: Self-pay | Admitting: Internal Medicine

## 2011-09-22 ENCOUNTER — Ambulatory Visit (HOSPITAL_BASED_OUTPATIENT_CLINIC_OR_DEPARTMENT_OTHER): Payer: Medicare Other | Admitting: Internal Medicine

## 2011-09-22 ENCOUNTER — Telehealth: Payer: Self-pay | Admitting: Internal Medicine

## 2011-09-22 ENCOUNTER — Ambulatory Visit
Admission: RE | Admit: 2011-09-22 | Discharge: 2011-09-22 | Disposition: A | Discharge status: Home / Self Care | Payer: Medicare Other | Source: Ambulatory Visit | Attending: Radiation Oncology | Admitting: Radiation Oncology

## 2011-09-22 VITALS — BP 140/74 | HR 72 | Temp 97.5°F | Resp 20 | Ht 65.0 in | Wt 153.0 lb

## 2011-09-22 DIAGNOSIS — C3490 Malignant neoplasm of unspecified part of unspecified bronchus or lung: Secondary | ICD-10-CM | POA: Diagnosis not present

## 2011-09-22 NOTE — Telephone Encounter (Signed)
called pt and r/s her 3/26 appt to one month out per mkm to 10/18/11   aom

## 2011-09-22 NOTE — Progress Notes (Signed)
[facility] [phone]   [phone]  OFFICE PROGRESS NOTE  [name], MD, MD [address] [place] [address]  PRINCIPAL DIAGNOSES:  1. Stage IA (T1a N0, Mx) non-small cell lung cancer, adenocarcinoma with bronchoalveolar features diagnosed in January 2010. The patient presented at that time with a right upper lobe lesion, as well as suspicious ground-glass opacities in the left lung. The tumor was negative for EGFR mutation and negative for ALK gene translocation.  2. Stage IA malignant melanoma, status post wide excision by Dr. Terri Piedra on Nov 12, 2008.  PRIOR THERAPY: Status post right upper lobectomy with lymph node dissection under the care of Dr. Edwyna Shell on May 21, 2008.   CURRENT THERAPY: Observation.  INTERVAL HISTORY: [name] 71 y.o. female returns to the clinic today for followup visit at the multidisciplinary thoracic oncology clinic [facility]). The patient is doing fine today with no specific complaints. She denied having any significant chest pain or shortness of breath. She has no cough or hemoptysis. No significant weight loss or night sweats. She was seen by Dr. Edwyna Shell recently and on 2020-09-911 she underwent fiberoptic bronchoscopy with endobronchial ultrasound and biopsy of the enlarging left upper lobe lesion. The final pathology was no conclusive for malignancy. The patient then underwent CT-guided biopsy of the left upper lobe lung mass by Dr. Fredia Sorrow on 09/15/2011 and it was consistent with adenocarcinoma. She is here today for evaluation and discussion of her treatment options.   MEDICAL HISTORY: Past Medical History  Diagnosis Date  . LBBB (left bundle branch block)   . Hypertension   . Hyperthyroidism     dr Everardo All- radioactive iodine treatment 2011- resolved  . Tremor     hand- intention tremor  . Cancer 1-10    lung- right non small cell, adenocarcinoma w/ broncho- alveolar features  . Melanoma    left leg  . Complication of anesthesia     difficulty awakening, dysrhythmia post surgery    ALLERGIES:  is allergic to meperidine hcl; morphine; penicillins; oxycodone-acetaminophen; propoxyphene n-acetaminophen; and tramadol hcl.  MEDICATIONS:  Current Outpatient Prescriptions  Medication Sig Dispense Refill  . Aspirin-Salicylamide-Caffeine (BC HEADACHE POWDER PO) Take 1 packet by mouth 3 (three) times daily.       Marland Kitchen atenolol (TENORMIN) 25 MG tablet Take 25 mg by mouth 2 (two) times daily.       . calcium-vitamin D (OSCAL WITH D) 500-200 MG-UNIT per tablet Take 1 tablet by mouth daily.       . celecoxib (CELEBREX) 200 MG capsule Take 200 mg by mouth daily as needed. For pain      . cholecalciferol (VITAMIN D) 1000 UNITS tablet Take 1,000 Units by mouth daily.       Marland Kitchen diltiazem (CARDIZEM CD) 240 MG 24 hr capsule Take 1 capsule (240 mg total) by mouth daily.  30 capsule  0  . drospirenone-estradiol (ANGELIQ) 0.5-1 MG per tablet Take 1 tablet by mouth daily.  30 tablet  11  . esomeprazole (NEXIUM) 40 MG capsule Take 1 capsule (40 mg total) by mouth daily.  30 capsule  3  . famotidine (PEPCID) 40 MG tablet Take 40 mg by mouth at bedtime as needed. For upset stomach      . hydrochlorothiazide (HYDRODIURIL) 25 MG tablet TAKE ONE (1) TABLET(S) EVERY MORNING  90 tablet  0  . Multiple Vitamin (MULITIVITAMIN WITH MINERALS) TABS Take 1 tablet by mouth daily.  SURGICAL HISTORY:  Past Surgical History  Procedure Date  . Tonsillectomy   . Conization for cervical dysplasia   . Rul lobectomy 2010    REVIEW OF SYSTEMS:  A comprehensive review of systems was negative.   PHYSICAL EXAMINATION: General appearance: alert, cooperative and no distress Head: Normocephalic, without obvious abnormality, atraumatic Neck: no adenopathy Lymph nodes: Cervical, supraclavicular, and axillary nodes normal. Resp: clear to auscultation bilaterally Cardio: regular rate and rhythm, S1, S2 normal, no murmur,  click, rub or gallop GI: soft, non-tender; bowel sounds normal; no masses,  no organomegaly Extremities: extremities normal, atraumatic, no cyanosis or edema Neurologic: Alert and oriented X 3, normal strength and tone. Normal symmetric reflexes. Normal coordination and gait  ECOG PERFORMANCE STATUS: 0 - Asymptomatic  Blood pressure 140/74, pulse 72, temperature 97.5 F (36.4 C), temperature source Oral, resp. rate 20, height 5\' 5"  (1.651 m), weight 153 lb (69.4 kg), SpO2 95.00%.  LABORATORY DATA: Lab Results  Component Value Date   WBC 7.7 09/15/2011   HGB 14.9 09/15/2011   HCT 42.5 09/15/2011   MCV 89.5 09/15/2011   PLT 205 09/15/2011      Chemistry      Component Value Date/Time   NA 140 09/02/2011 1444   NA 142 08/19/2011 1343   K 3.4* 09/02/2011 1444   K 3.5 08/19/2011 1343   CL 100 09/02/2011 1444   CL 102 08/19/2011 1343   CO2 27 09/02/2011 1444   CO2 30 08/19/2011 1343   BUN 20 09/02/2011 1444   BUN 22 08/19/2011 1343   CREATININE 1.24* 09/02/2011 1444   CREATININE 1.4* 08/19/2011 1343      Component Value Date/Time   CALCIUM 10.2 09/02/2011 1444   CALCIUM 9.0 08/19/2011 1343   ALKPHOS 54 09/02/2011 1444   ALKPHOS 45 08/19/2011 1343   AST 20 09/02/2011 1444   AST 17 08/19/2011 1343   ALT 14 09/02/2011 1444   BILITOT 0.3 09/02/2011 1444   BILITOT 0.50 08/19/2011 1343       RADIOGRAPHIC STUDIES: Dg Chest 1 View  09/16/2011  *RADIOLOGY REPORT*  Clinical Data: Left pneumothorax.  Lung biopsy yesterday.  CHEST - 1 VIEW  Comparison: 09/15/2011, 1622 hours.  Findings: Small left apical pneumothorax remains present, unchanged.  Left upper lobe pulmonary opacity appears little changed compared yesterday's exam as well, compatible with nodule and associated hemorrhage from the percutaneous biopsy.  Right lung is unchanged.  IMPRESSION:  1.  Unchanged small left apical pneumothorax. 2.  Pulmonary nodule with surrounding pulmonary hemorrhage status post biopsy.  Original Report Authenticated By: Andreas Newport, M.D.   Dg Chest 2 View  09/20/2011  *RADIOLOGY REPORT*  Clinical Data: Right lung surgery in 2010 for lung carcinoma, recent biopsy of a nodule in the left lung, follow-up  CHEST - 2 VIEW  Comparison: Portable chest x-ray of 09/16/2011  Findings: The left pneumothorax has decreased in volume, now approximately 5%.  The parenchymal opacity at the site of biopsy in the left upper lobe also has diminished.  No pleural effusion is seen.  The right lung is clear.  Heart size is stable.  There does appear to be a hiatal hernia present.  IMPRESSION:  1.  Decrease in volume of the left apical pneumothorax to approximately 5%. 2.  Decrease in parenchymal opacity in the left upper lobe after biopsy.  Original Report Authenticated By: Juline Patch, M.D.   Dg Chest 2 View  09/15/2011  *RADIOLOGY REPORT*  Clinical Data: Post lung biopsy.  The patient is asymptomatic.  CHEST - 2 VIEW  Comparison: Radiographs 06/12/202013.  Images from CT today.  Findings: There is increased density in the left upper lobe surrounding the nodule consistent with post biopsy hemorrhage. There is a 10-15% left apical pneumothorax without associated mediastinal shift.  The right lung is clear.  The heart size and mediastinal contours are stable.  IMPRESSION: Left upper lobe hemorrhage surrounding the biopsied lesion and small apical pneumothorax as described.  Findings have been discussed by telephone with Dr. Deanne Coffer at 1435 hours.  Original Report Authenticated By: Gerrianne Scale, M.D.   Dg Chest 2 View  09/02/2011  *RADIOLOGY REPORT*  Clinical Data: Post bronchoscopy, some short of breath  CHEST - 2 VIEW  Comparison: CT chest of 08/19/2011  Findings: Vague nodular opacities are noted which correspond to foci of ground-glass opacity on the left.  No pneumothorax is seen post bronchoscopy.  No effusion is noted.  The heart is mildly enlarged.  A small hiatal hernia is present.  IMPRESSION:   No pneumothorax.  The ground-glass  opacities again are noted on the left.  Original Report Authenticated By: Juline Patch, M.D.   Ct Biopsy  09/15/2011  *RADIOLOGY REPORT*  Clinical Data: History of right upper lobe adenocarcinoma of the lung and melanoma. Enlarging left upper lobe pulmonary nodule present.  Prior bronchoscopy and biopsy was not diagnostic for malignancy.  CT GUIDED CORE BIOPSY OF LEFT UPPER LOBE LUNG NODULE  Sedation:   2.0 mg IV Versed;  100 mcg IV Fentanyl  Total Moderate Sedation Time: 28 minutes.  Procedure:  The procedure risks, benefits, and alternatives were explained to the patient.  Questions regarding the procedure were encouraged and answered.  The patient understands and consents to the procedure.  The left anterior chest wall was prepped with betadine in a sterile fashion, and a sterile drape was applied covering the operative field.  A sterile gown and sterile gloves were used for the procedure.  Local anesthesia was provided with 1% Lidocaine.  CT was performed in a supine position for localizing purposes. From an anterior approach, a 17 gauge needle was advanced under CT guidance to the level of the left upper lobe nodule.  Coaxial 18 gauge core biopsy samples were obtained.  Two separate core biopsy samples were submitted in formalin.  The outer needle was removed and additional CT performed.  Complications: Pulmonary hemorrhage.  No pneumothorax.  Findings: Left upper lobe nodule was localized.  This measures approximately 1.8 cm in diameter.  Both core biopsy samples revealed solid tissue.  Immediately after the first core biopsy was obtained, there was regional hemorrhage in the posterior left upper lobe surrounding the nodule.  No pneumothorax was present upon completion of the procedure.  The patient did not develop hemoptysis or chest pain.  She will recover for 3 hours after the procedure on bedrest.  IMPRESSION: CT guided core biopsy performed of the enlarging left upper lobe pulmonary nodule.  Regional  pulmonary hemorrhage did develop without immediate hemoptysis or symptoms.  Original Report Authenticated By: Reola Calkins, M.D.   Dg Chest 1vsame Day  09/15/2011  *RADIOLOGY REPORT*  Clinical Data: Follow-up postprocedural pneumothorax.  CHEST - 1 VIEW SAME DAY  Comparison: 09/15/2011 at 1417 hours and 06/12/202013.  Findings: Trachea is midline.  Heart size normal.  Small left apical pneumothorax may be minimally larger.  Opacification in the left upper lobe surrounds a previously seen spiculated nodule. Right apical pleural thickening.  Right lung  is otherwise clear. No pleural fluid.  IMPRESSION:  1.  Small left apical pneumothorax may have enlarged slightly in the interval from 1417 hours the same day. 2.  Small post procedural pulmonary hemorrhage in the left upper lobe, surrounding a known spiculated nodule, stable.  Original Report Authenticated By: Reyes Ivan, M.D.   Dg Chest Port 1 View  09/05/2011  *RADIOLOGY REPORT*  Clinical Data: Post bronchoscopy.  PORTABLE CHEST - 1 VIEW  Comparison: 09/02/2011.  Findings: The vague opacities noted within the left upper lobe and left midlung zone appear unchanged.  There is no pneumothorax or hemothorax post bronchoscopy.  There is borderline cardiomegaly. There are no infiltrates or edematous changes.  IMPRESSION: Vague opacities within the left mid and upper lung zone - unchanged . No pneumothorax or hemothorax.  Original Report Authenticated By: Rolla Plate, M.D.   Dg C-arm Bronchoscopy  09/05/2011  CLINICAL DATA: needed for case   C-ARM BRONCHOSCOPY  Fluoroscopy was utilized by the requesting physician.  No radiographic  interpretation.      ASSESSMENT: This is a very pleasant 71 years old white female with recurrent non-small cell lung cancer, adenocarcinoma.,with confirmed tissue diagnosis of the left upper lobe lung nodule but the patient also has multiple groundglass opacities bilaterally suspicious for bronchoalveolar  carcinoma.  PLAN: I have a lengthy discussion with the patient and her husband today about her disease status and treatment options. I recommended for the patient and her treatment with stereotactic radiotherapy to the left upper lobe lung nodule followed by either observation or treatment with oral Tarceva 150 mg by mouth daily because of the multiple pulmonary nodules. After discussion of the twol options in details, the patient agreed to proceed with a stereotactic radiotherapy followed by Tarceva treatment. I would see her back for followup visit in one month's for reevaluation and more detailed discussion of the Tarceva and its adverse effect.  All questions were answered. The patient knows to call the clinic with any problems, questions or concerns. We can certainly see the patient much sooner if necessary.  I spent 20 minutes counseling the patient face to face. The total time spent in the appointment was 35 minutes.

## 2011-09-22 NOTE — Progress Notes (Signed)
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  HISTORY OF PRESENT ILLNESS:  [name] is a pleasant 71 year old female who has a history of lung nodules which she states she has been followed for about 25 years.  In 2010, one of these nodules being growing and she underwent a right upper lobectomy at that time.  On recent CT scan on 08/19/2011, a left upper lobe nodule was noted to become larger as well as more solid in appearance.  Other nodules in the left lung were stable.  She underwent a bronchoscopy which was nondiagnostic.  She then underwent a transthoracic needle biopsy which was unfortunately complicated by a hemopneumothorax.  The pathology is positive for bronchoalveolar carcinoma.  She has recovered well from that and presents today to Multidisciplinary Thoracic Oncology Conference for consideration of radiation to this left upper lobe nodule.  I should mention the pathology on the left upper lobe lung biopsy was adenocarcinoma.  [name] has been followed for many years by Dr. Edwyna Shell and Dr. Arbutus Ped for these multiple other lung nodules and Dr. Arbutus Ped does feel that she is a candidate for EGFR therapy.  She has no respiratory symptoms.  She is active with the [employer].  PAST MEDICAL HISTORY: 1. Lung cancer. 2. Cervical cancer. 3. History of melanoma of the left leg. 4. Left bundle-branch block. 5. Hypertension. 6. Hyperthyroidism status post radioactive iodine treatment in 2011. 7. Status post tonsillectomy.  FAMILY HISTORY:  She had a maternal uncle with lung cancer and maternal grandfather with lung cancer.  REVIEW OF SYSTEMS:  All other systems were reviewed and found to be negative.  SOCIAL HISTORY:  She essentially has no smoking history.  She smoked maybe a pack a day for a couple years but then quit.  She drinks socially.  She is accompanied by her husband.  PHYSICAL  EXAMINATION:  General:  She is a pleasant female in no distress, sitting comfortably on the exam room table.  Vital Signs: Weight 153 pounds.  Height 5 feet 5 inches.  Blood pressure 140/74, pulse 72, temperature 97.5.  She is alert and awake.  She is in no distress.  ALLERGIES:  Demerol, morphine, penicillins, oxycodone, Darvon, and tramadol.  IMPRESSION:  71 year old female with likely multifocal bronchoalveolar carcinoma with an area of biopsied adenocarcinoma in the left upper lobe.  RECOMMENDATIONS:  I spoke to Ms. Salmons and her husband regarding her diagnosis and options for treatment.  We discussed that indolent nature of VAC as well as our ability to provide local control to this area which may have developed into a more aggressive phenotype.  We discussed 3 treatments as an outpatient.  We discussed possible risks and benefits of treatment including damage to the normal lung as well as possible fatigue.  I have consented her for treatment and scheduled her for simulation on Wednesday.  I spent 2 hours with this patient.  Greater than 50% of that time was spent in counseling and coordination of care.    ______________________________ Lurline Hare, M.D. SW/MEDQ  D:  09/22/2011  T:  09/22/2011  Job:  [id number]

## 2011-09-28 ENCOUNTER — Ambulatory Visit
Admission: RE | Admit: 2011-09-28 | Discharge: 2011-09-28 | Disposition: A | Discharge status: Home / Self Care | Payer: Medicare Other | Source: Ambulatory Visit | Attending: Radiation Oncology | Admitting: Radiation Oncology

## 2011-09-28 VITALS — BP 159/97 | HR 70 | Temp 98.0°F | Resp 20 | Ht 65.0 in | Wt 152.2 lb

## 2011-09-28 DIAGNOSIS — Z79899 Other long term (current) drug therapy: Secondary | ICD-10-CM | POA: Diagnosis not present

## 2011-09-28 DIAGNOSIS — C3410 Malignant neoplasm of upper lobe, unspecified bronchus or lung: Secondary | ICD-10-CM | POA: Insufficient documentation

## 2011-09-28 DIAGNOSIS — I447 Left bundle-branch block, unspecified: Secondary | ICD-10-CM | POA: Insufficient documentation

## 2011-09-28 DIAGNOSIS — Z510 Encounter for antineoplastic radiation therapy: Secondary | ICD-10-CM | POA: Insufficient documentation

## 2011-09-28 DIAGNOSIS — Z7982 Long term (current) use of aspirin: Secondary | ICD-10-CM | POA: Diagnosis not present

## 2011-09-28 DIAGNOSIS — C439 Malignant melanoma of skin, unspecified: Secondary | ICD-10-CM | POA: Insufficient documentation

## 2011-09-28 DIAGNOSIS — C801 Malignant (primary) neoplasm, unspecified: Secondary | ICD-10-CM

## 2011-09-28 NOTE — Progress Notes (Signed)
Pt denies pain and cough, does have sob w/exertion.

## 2011-09-28 NOTE — Progress Notes (Signed)
Met with patient to discuss RO billing.   Rad Tx: SRS Robot x5   Attending Rad:  Dr. Michell Heinrich   Dx: 162.9 Lung, NOS

## 2011-09-28 NOTE — Progress Notes (Signed)
[facility] Radiation Oncology Simulation and Treatment Planning Note   Name:  [name] MRN: [id number]   Date: 09/28/2011  DOB: 1941-04-05  Status:outpatient    DIAGNOSIS:  1. T1N0M1 AdenoCA of LUL, multifocal     CONSENT VERIFIED:yes   SET UP: Patient is setup supine   IMMOBILIZATION: The patient was immobilized using a Vac Loc bag and Abdominal Compression.   NARRATIVE:The patient was brought to the CT Simulation planning suite.  Identity was confirmed.  All relevant records and images related to the planned course of therapy were reviewed.  Then, the patient was positioned in a stable reproducible clinical set-up for radiation therapy. Abdominal compression was applied by me.  4D CT images were obtained and reproducible breathing pattern was confirmed. Free breathing CT images were obtained.  Skin markings were placed.  The CT images were loaded into the planning software where the target and avoidance structures were contoured.  The radiation prescription was entered and confirmed.    TREATMENT PLANNING NOTE:  Treatment planning then occurred. I have requested : MLC's, isodose plan, basic dose calculation.  3 dimensional simulation is performed and dose volume histogram of the gross tumor volume, planning tumor volume and criticial normal structures including the spinal cord and lungs were analyzed and requested.  Special treatment procedure was performed due to high dose per fraction.  The patient will be monitored for increased risk of toxicity.  Daily imaging using cone beam CT will be used for target localization.

## 2011-10-03 DIAGNOSIS — D235 Other benign neoplasm of skin of trunk: Secondary | ICD-10-CM | POA: Diagnosis not present

## 2011-10-03 DIAGNOSIS — L570 Actinic keratosis: Secondary | ICD-10-CM | POA: Diagnosis not present

## 2011-10-03 DIAGNOSIS — D485 Neoplasm of uncertain behavior of skin: Secondary | ICD-10-CM | POA: Diagnosis not present

## 2011-10-03 DIAGNOSIS — Z85820 Personal history of malignant melanoma of skin: Secondary | ICD-10-CM | POA: Diagnosis not present

## 2011-10-04 ENCOUNTER — Other Ambulatory Visit: Payer: Medicare Other | Admitting: Lab

## 2011-10-04 ENCOUNTER — Ambulatory Visit: Payer: Medicare Other | Admitting: Internal Medicine

## 2011-10-04 DIAGNOSIS — Z79899 Other long term (current) drug therapy: Secondary | ICD-10-CM | POA: Diagnosis not present

## 2011-10-04 DIAGNOSIS — Z510 Encounter for antineoplastic radiation therapy: Secondary | ICD-10-CM | POA: Diagnosis not present

## 2011-10-04 DIAGNOSIS — Z7982 Long term (current) use of aspirin: Secondary | ICD-10-CM | POA: Diagnosis not present

## 2011-10-04 DIAGNOSIS — C3410 Malignant neoplasm of upper lobe, unspecified bronchus or lung: Secondary | ICD-10-CM | POA: Diagnosis not present

## 2011-10-11 ENCOUNTER — Encounter: Payer: Self-pay | Admitting: Radiation Oncology

## 2011-10-11 ENCOUNTER — Ambulatory Visit
Admission: RE | Admit: 2011-10-11 | Discharge: 2011-10-11 | Disposition: A | Discharge status: Home / Self Care | Payer: Medicare Other | Source: Ambulatory Visit | Attending: Radiation Oncology | Admitting: Radiation Oncology

## 2011-10-11 VITALS — BP 170/79 | HR 55 | Wt 152.0 lb

## 2011-10-11 DIAGNOSIS — Z7982 Long term (current) use of aspirin: Secondary | ICD-10-CM | POA: Diagnosis not present

## 2011-10-11 DIAGNOSIS — Z79899 Other long term (current) drug therapy: Secondary | ICD-10-CM | POA: Diagnosis not present

## 2011-10-11 DIAGNOSIS — C3410 Malignant neoplasm of upper lobe, unspecified bronchus or lung: Secondary | ICD-10-CM

## 2011-10-11 DIAGNOSIS — Z510 Encounter for antineoplastic radiation therapy: Secondary | ICD-10-CM | POA: Diagnosis not present

## 2011-10-11 NOTE — Progress Notes (Signed)
First treatment today.  No voiced complaints.

## 2011-10-11 NOTE — Progress Notes (Signed)
Weekly Management Note Current Dose:  12 Gy  Projected Dose: 60 Gy   Narrative:  The patient presents for routine under treatment assessment.  CBCT/MVCT images/Port film x-rays were reviewed.  The chart was checked. I verified her position on the treatment machine today. She set up well after her second CBCT. No complaints.   Physical Findings: Weight: 152 lb (68.947 kg). Unchanged  Impression:  The patient is tolerating radiation.  Plan:  Continue treatment as planned. We discussed her plan and I showed Ms. Billy and her husband her QA fluence maps and her eclipse plan scanned into mosaiq.

## 2011-10-13 ENCOUNTER — Ambulatory Visit
Admission: RE | Admit: 2011-10-13 | Discharge: 2011-10-13 | Disposition: A | Discharge status: Home / Self Care | Payer: Medicare Other | Source: Ambulatory Visit | Attending: Radiation Oncology | Admitting: Radiation Oncology

## 2011-10-13 DIAGNOSIS — C3410 Malignant neoplasm of upper lobe, unspecified bronchus or lung: Secondary | ICD-10-CM | POA: Diagnosis not present

## 2011-10-13 DIAGNOSIS — Z7982 Long term (current) use of aspirin: Secondary | ICD-10-CM | POA: Diagnosis not present

## 2011-10-13 DIAGNOSIS — Z510 Encounter for antineoplastic radiation therapy: Secondary | ICD-10-CM | POA: Diagnosis not present

## 2011-10-13 DIAGNOSIS — Z79899 Other long term (current) drug therapy: Secondary | ICD-10-CM | POA: Diagnosis not present

## 2011-10-17 ENCOUNTER — Ambulatory Visit
Admission: RE | Admit: 2011-10-17 | Discharge: 2011-10-17 | Disposition: A | Discharge status: Home / Self Care | Payer: Medicare Other | Source: Ambulatory Visit | Attending: Radiation Oncology | Admitting: Radiation Oncology

## 2011-10-17 DIAGNOSIS — Z7982 Long term (current) use of aspirin: Secondary | ICD-10-CM | POA: Diagnosis not present

## 2011-10-17 DIAGNOSIS — Z510 Encounter for antineoplastic radiation therapy: Secondary | ICD-10-CM | POA: Diagnosis not present

## 2011-10-17 DIAGNOSIS — C3410 Malignant neoplasm of upper lobe, unspecified bronchus or lung: Secondary | ICD-10-CM | POA: Diagnosis not present

## 2011-10-17 DIAGNOSIS — Z79899 Other long term (current) drug therapy: Secondary | ICD-10-CM | POA: Diagnosis not present

## 2011-10-18 ENCOUNTER — Ambulatory Visit (HOSPITAL_BASED_OUTPATIENT_CLINIC_OR_DEPARTMENT_OTHER): Payer: Medicare Other | Admitting: Internal Medicine

## 2011-10-18 ENCOUNTER — Telehealth: Payer: Self-pay | Admitting: Internal Medicine

## 2011-10-18 ENCOUNTER — Other Ambulatory Visit (HOSPITAL_BASED_OUTPATIENT_CLINIC_OR_DEPARTMENT_OTHER): Payer: Medicare Other | Admitting: Lab

## 2011-10-18 VITALS — BP 161/72 | HR 60 | Temp 97.6°F | Ht 65.0 in | Wt 152.7 lb

## 2011-10-18 DIAGNOSIS — C3490 Malignant neoplasm of unspecified part of unspecified bronchus or lung: Secondary | ICD-10-CM | POA: Diagnosis not present

## 2011-10-18 DIAGNOSIS — C3410 Malignant neoplasm of upper lobe, unspecified bronchus or lung: Secondary | ICD-10-CM | POA: Diagnosis not present

## 2011-10-18 DIAGNOSIS — C439 Malignant melanoma of skin, unspecified: Secondary | ICD-10-CM | POA: Diagnosis not present

## 2011-10-18 LAB — CBC WITH DIFFERENTIAL/PLATELET
Basophils Absolute: 0 10*3/uL (ref 0.0–0.1)
EOS%: 4.9 % (ref 0.0–7.0)
Eosinophils Absolute: 0.5 10*3/uL (ref 0.0–0.5)
HGB: 14.8 g/dL (ref 11.6–15.9)
MCV: 90.1 fL (ref 79.5–101.0)
MONO%: 9.2 % (ref 0.0–14.0)
NEUT#: 6.1 10*3/uL (ref 1.5–6.5)
RBC: 4.75 10*6/uL (ref 3.70–5.45)
RDW: 13.3 % (ref 11.2–14.5)
lymph#: 2.4 10*3/uL (ref 0.9–3.3)

## 2011-10-18 LAB — COMPREHENSIVE METABOLIC PANEL
AST: 17 U/L (ref 0–37)
Albumin: 4.3 g/dL (ref 3.5–5.2)
Alkaline Phosphatase: 50 U/L (ref 39–117)
BUN: 24 mg/dL — ABNORMAL HIGH (ref 6–23)
Calcium: 9.4 mg/dL (ref 8.4–10.5)
Chloride: 103 mEq/L (ref 96–112)
Glucose, Bld: 103 mg/dL — ABNORMAL HIGH (ref 70–99)
Potassium: 3.9 mEq/L (ref 3.5–5.3)
Sodium: 140 mEq/L (ref 135–145)
Total Protein: 6.3 g/dL (ref 6.0–8.3)

## 2011-10-18 NOTE — Progress Notes (Signed)
[facility] [phone]   [phone]  OFFICE PROGRESS NOTE  [name], MD, MD [address] [place] [address]  PRINCIPAL DIAGNOSES:  1. Stage IA (T1a N0, Mx) non-small cell lung cancer, adenocarcinoma with bronchoalveolar features diagnosed in January 2010. The patient presented at that time with a right upper lobe lesion, as well as suspicious ground-glass opacities in the left lung. The tumor was negative for EGFR mutation and negative for ALK gene translocation.  2. Stage IA malignant melanoma, status post wide excision by Dr. Terri Piedra on Nov 12, 2008.  PRIOR THERAPY: Status post right upper lobectomy with lymph node dissection under the care of Dr. Edwyna Shell on May 21, 2008.   CURRENT THERAPY: Stereotactic radiotherapy to the left upper lobe lung nodule under the care of Dr. Michell Heinrich expected to be completed on 10/21/2011.   INTERVAL HISTORY: [name] 71 y.o. female returns to the clinic today for followup visit accompanied by her husband. The patient is currently undergoing stereotactic radiotherapy to the progressive left upper lobe lung nodule. She is tolerating her treatment fairly well. She denied having any significant complaints today. She has no chest pain or shortness of breath, no cough or hemoptysis. No significant weight loss or night sweats. She is here for evaluation and discussion of her systemic treatment options.  MEDICAL HISTORY: Past Medical History  Diagnosis Date  . LBBB (left bundle branch block)   . Hypertension   . Hyperthyroidism     dr Everardo All- radioactive iodine treatment 2011- resolved  . Tremor     hand- intention tremor  . Cancer 1-10    lung- right non small cell, adenocarcinoma w/ broncho- alveolar features  . Melanoma     left leg  . Complication of anesthesia     difficulty awakening, dysrhythmia post surgery    ALLERGIES:  is allergic to meperidine hcl; morphine; penicillins;  oxycodone-acetaminophen; propoxacet-n; and tramadol hcl.  MEDICATIONS:  Current Outpatient Prescriptions  Medication Sig Dispense Refill  . Aspirin-Salicylamide-Caffeine (BC HEADACHE POWDER PO) Take 1 packet by mouth 3 (three) times daily.       Marland Kitchen atenolol (TENORMIN) 25 MG tablet Take 25 mg by mouth 2 (two) times daily.       . calcium-vitamin D (OSCAL WITH D) 500-200 MG-UNIT per tablet Take 1 tablet by mouth daily.       . celecoxib (CELEBREX) 200 MG capsule Take 200 mg by mouth daily as needed. For pain      . cholecalciferol (VITAMIN D) 1000 UNITS tablet Take 1,000 Units by mouth daily.       Marland Kitchen diltiazem (CARDIZEM CD) 240 MG 24 hr capsule Take 1 capsule (240 mg total) by mouth daily.  30 capsule  0  . drospirenone-estradiol (ANGELIQ) 0.5-1 MG per tablet Take 1 tablet by mouth daily.  30 tablet  11  . esomeprazole (NEXIUM) 40 MG capsule Take 1 capsule (40 mg total) by mouth daily.  30 capsule  3  . famotidine (PEPCID) 40 MG tablet Take 40 mg by mouth at bedtime as needed. For upset stomach      . hydrochlorothiazide (HYDRODIURIL) 25 MG tablet TAKE ONE (1) TABLET(S) EVERY MORNING  90 tablet  0  . Multiple Vitamin (MULITIVITAMIN WITH MINERALS) TABS Take 1 tablet by mouth daily.      Marland Kitchen DISCONTD: diltiazem (DILACOR XR) 240 MG 24 hr capsule Take 1 capsule by mouth Daily.        SURGICAL  HISTORY:  Past Surgical History  Procedure Date  . Tonsillectomy   . Conization for cervical dysplasia   . Rul lobectomy 2010    REVIEW OF SYSTEMS:  A comprehensive review of systems was negative.   PHYSICAL EXAMINATION: General appearance: alert, cooperative and no distress Head: Normocephalic, without obvious abnormality, atraumatic Neck: no adenopathy Lymph nodes: Cervical, supraclavicular, and axillary nodes normal. Resp: clear to auscultation bilaterally Cardio: regular rate and rhythm, S1, S2 normal, no murmur, click, rub or gallop GI: soft, non-tender; bowel sounds normal; no masses,  no  organomegaly Extremities: extremities normal, atraumatic, no cyanosis or edema Neurologic: Alert and oriented X 3, normal strength and tone. Normal symmetric reflexes. Normal coordination and gait  ECOG PERFORMANCE STATUS: 0 - Asymptomatic  Blood pressure 161/72, pulse 60, temperature 97.6 F (36.4 C), temperature source Oral, height 5\' 5"  (1.651 m), weight 152 lb 11.2 oz (69.264 kg).  LABORATORY DATA: Lab Results  Component Value Date   WBC 9.9 10/18/2011   HGB 14.8 10/18/2011   HCT 42.8 10/18/2011   MCV 90.1 10/18/2011   PLT 210 10/18/2011      Chemistry      Component Value Date/Time   NA 140 09/02/2011 1444   NA 142 08/19/2011 1343   K 3.4* 09/02/2011 1444   K 3.5 08/19/2011 1343   CL 100 09/02/2011 1444   CL 102 08/19/2011 1343   CO2 27 09/02/2011 1444   CO2 30 08/19/2011 1343   BUN 20 09/02/2011 1444   BUN 22 08/19/2011 1343   CREATININE 1.24* 09/02/2011 1444   CREATININE 1.4* 08/19/2011 1343      Component Value Date/Time   CALCIUM 10.2 09/02/2011 1444   CALCIUM 9.0 08/19/2011 1343   ALKPHOS 54 09/02/2011 1444   ALKPHOS 45 08/19/2011 1343   AST 20 09/02/2011 1444   AST 17 08/19/2011 1343   ALT 14 09/02/2011 1444   BILITOT 0.3 09/02/2011 1444   BILITOT 0.50 08/19/2011 1343       RADIOGRAPHIC STUDIES: Dg Chest 2 View  09/20/2011  *RADIOLOGY REPORT*  Clinical Data: Right lung surgery in 2010 for lung carcinoma, recent biopsy of a nodule in the left lung, follow-up  CHEST - 2 VIEW  Comparison: Portable chest x-ray of 09/16/2011  Findings: The left pneumothorax has decreased in volume, now approximately 5%.  The parenchymal opacity at the site of biopsy in the left upper lobe also has diminished.  No pleural effusion is seen.  The right lung is clear.  Heart size is stable.  There does appear to be a hiatal hernia present.  IMPRESSION:  1.  Decrease in volume of the left apical pneumothorax to approximately 5%. 2.  Decrease in parenchymal opacity in the left upper lobe after biopsy.  Original Report  Authenticated By: Juline Patch, M.D.    ASSESSMENT: This is a very pleasant 71 years old white female with recurrent non-small cell lung cancer, adenocarcinoma. She is currently undergoing stereotactic radiotherapy to a left upper lobe lung nodule.  PLAN: I have a lengthy discussion with the patient and her husband today about her current condition and treatment options. I discussed with the patient in details her options regarding observation versus treatment with oral Tarceva. The patient has some concern about starting systemic therapy now in the absence of any symptoms or disease progression. We discussed the adverse effect of Tarceva including but not limited to skin rash, diarrhea, interstitial lung disease, liver or renal dysfunction.  By the end of the  discussion, the patient decided to continue on observation for now. I would see her back for followup visit in 3 months with repeat CT scan of the chest. She was advised to call me immediately if she has any concerning symptoms in the interval.  All questions were answered. The patient knows to call the clinic with any problems, questions or concerns. We can certainly see the patient much sooner if necessary.  I spent 20 minutes counseling the patient face to face. The total time spent in the appointment was 40 minutes.

## 2011-10-18 NOTE — Telephone Encounter (Signed)
gv pt appt for [id number].  scheduled pt for ct scan on 7-08 @ [facility]

## 2011-10-19 ENCOUNTER — Ambulatory Visit
Admission: RE | Admit: 2011-10-19 | Discharge: 2011-10-19 | Disposition: A | Discharge status: Home / Self Care | Payer: Medicare Other | Source: Ambulatory Visit | Attending: Radiation Oncology | Admitting: Radiation Oncology

## 2011-10-19 DIAGNOSIS — Z79899 Other long term (current) drug therapy: Secondary | ICD-10-CM | POA: Diagnosis not present

## 2011-10-19 DIAGNOSIS — Z7982 Long term (current) use of aspirin: Secondary | ICD-10-CM | POA: Diagnosis not present

## 2011-10-19 DIAGNOSIS — Z510 Encounter for antineoplastic radiation therapy: Secondary | ICD-10-CM | POA: Diagnosis not present

## 2011-10-19 DIAGNOSIS — C3410 Malignant neoplasm of upper lobe, unspecified bronchus or lung: Secondary | ICD-10-CM | POA: Diagnosis not present

## 2011-10-21 ENCOUNTER — Ambulatory Visit
Admission: RE | Admit: 2011-10-21 | Discharge: 2011-10-21 | Disposition: A | Discharge status: Home / Self Care | Payer: Medicare Other | Source: Ambulatory Visit | Attending: Radiation Oncology | Admitting: Radiation Oncology

## 2011-10-21 ENCOUNTER — Encounter: Payer: Self-pay | Admitting: Radiation Oncology

## 2011-10-21 DIAGNOSIS — C3410 Malignant neoplasm of upper lobe, unspecified bronchus or lung: Secondary | ICD-10-CM | POA: Diagnosis not present

## 2011-10-21 DIAGNOSIS — Z79899 Other long term (current) drug therapy: Secondary | ICD-10-CM | POA: Diagnosis not present

## 2011-10-21 DIAGNOSIS — Z510 Encounter for antineoplastic radiation therapy: Secondary | ICD-10-CM | POA: Diagnosis not present

## 2011-10-21 DIAGNOSIS — Z7982 Long term (current) use of aspirin: Secondary | ICD-10-CM | POA: Diagnosis not present

## 2011-10-26 NOTE — Progress Notes (Signed)
  Radiation Oncology         [phone] ________________________________  Name: [name] MRN: [id number]  Date: 10/21/2011  DOB: [birth year]  End of Treatment Note  Diagnosis:   T1N0M1 Adenocarcinoma of the Left Upper Lobe  Indication for treatment:  Curative       Radiation treatment dates:  10/11/2011-10/21/2011  Site/dose:   Left Upper Lobe  Beams/energy:   Dynamic conformal arcs with 6 MV filter free photons  Narrative: The patient tolerated radiation treatment relatively well.   Daily conebeam CT was performed prior to each fraction to ensure proper positioning.  She had no ill effects from treatment.   Plan: The patient has completed radiation treatment. The patient will return to radiation oncology clinic for routine followup in one month. I advised them to call or return sooner if they have any questions or concerns related to their recovery or treatment.  ------------------------------------------------  Lurline Hare, MD

## 2011-10-26 NOTE — Progress Notes (Signed)
  Radiation Oncology         [phone] ________________________________  Name: [name] MRN: [id number]  Date: 10/11/2011  DOB: 1940/07/22  Simulation Verification Note  Status: outpatient  NARRATIVE: The patient was brought to the treatment unit and placed in the planned treatment position. The clinical setup was verified.  I reviewed the CBCT and agree with its positioning. The targeted volume of tissue appears to be appropriately covered by the radiation beams.  Organs at risk appear to be excluded as planned.  Based on my personal review, I approved the simulation verification. The patient's treatment will proceed as planned.  ------------------------------------------------  Lurline Hare, MD

## 2011-11-17 ENCOUNTER — Encounter: Payer: Self-pay | Admitting: Radiation Oncology

## 2011-11-17 ENCOUNTER — Ambulatory Visit
Admission: RE | Admit: 2011-11-17 | Discharge: 2011-11-17 | Disposition: A | Discharge status: Home / Self Care | Payer: Medicare Other | Source: Ambulatory Visit | Attending: Radiation Oncology | Admitting: Radiation Oncology

## 2011-11-17 VITALS — BP 147/71 | HR 61 | Temp 97.3°F | Wt 152.5 lb

## 2011-11-17 DIAGNOSIS — C3410 Malignant neoplasm of upper lobe, unspecified bronchus or lung: Secondary | ICD-10-CM

## 2011-11-17 NOTE — Progress Notes (Signed)
FU today.  !st FU since end of treatment on 10/21/11.  Denies any pain, and SOB but states she feels her energy level is lower than her normal and she is not as active as she used to be.

## 2011-11-17 NOTE — Progress Notes (Signed)
Department of Radiation Oncology  Phone:  [phone] Fax:        [phone]   Name: [name]   DOB: [birth year]  MRN: [id number]    Date: 11/17/2011  Follow Up Visit Note  CC: [name], MD  Diagnosis: Multifocal broncho-alveolar carcinoma  Interval since last radiation: One month  Allergies:  Allergies  Allergen Reactions  . Meperidine Hcl Anaphylaxis  . Morphine Other (See Comments)    Stopped breathing  . Penicillins Other (See Comments)    Difficulty breathing  . Oxycodone-Acetaminophen Itching       . Propoxyphene-Acetaminophen Nausea And Vomiting  . Tramadol Hcl Nausea And Vomiting    Medications:  Current Outpatient Prescriptions  Medication Sig Dispense Refill  . Aspirin-Salicylamide-Caffeine (BC HEADACHE POWDER PO) Take 1 packet by mouth 3 (three) times daily.       Marland Kitchen atenolol (TENORMIN) 25 MG tablet Take 25 mg by mouth 2 (two) times daily.       . calcium-vitamin D (OSCAL WITH D) 500-200 MG-UNIT per tablet Take 1 tablet by mouth daily.       . celecoxib (CELEBREX) 200 MG capsule Take 200 mg by mouth daily as needed. For pain      . cholecalciferol (VITAMIN D) 1000 UNITS tablet Take 1,000 Units by mouth daily.       Marland Kitchen diltiazem (CARDIZEM CD) 240 MG 24 hr capsule Take 1 capsule (240 mg total) by mouth daily.  30 capsule  0  . drospirenone-estradiol (ANGELIQ) 0.5-1 MG per tablet Take 1 tablet by mouth daily.  30 tablet  11  . esomeprazole (NEXIUM) 40 MG capsule Take 1 capsule (40 mg total) by mouth daily.  30 capsule  3  . famotidine (PEPCID) 40 MG tablet Take 40 mg by mouth at bedtime as needed. For upset stomach      . hydrochlorothiazide (HYDRODIURIL) 25 MG tablet TAKE ONE (1) TABLET(S) EVERY MORNING  90 tablet  0  . Multiple Vitamin (MULITIVITAMIN WITH MINERALS) TABS Take 1 tablet by mouth daily.      Marland Kitchen DISCONTD: diltiazem (DILACOR XR) 240 MG 24 hr capsule Take 1 capsule by mouth Daily.        Interval History: [name] presents today for  routine followup.  She is feeling well since her treatment. She was actually able to leave the day after her treatment for a trip to [place] in [place]. She has some dyspnea on exertion but otherwise has felt well. She met with Dr. Arbutus Ped and declined Tarceva. She would like to just be monitored at this time. I think this is a reasonable approach. She is a company by husband today.  Physical Exam:   weight is 152 lb 8 oz (69.174 kg). Her temperature is 97.3 F (36.3 C). Her blood pressure is 147/71 and her pulse is 61.  Is in no respiratory distress  IMPRESSION: Multifocal bronchoalveolar carcinoma status post SBRT to a left upper lobe lesion  PLAN:  [name] is a 71 y.o. female who has completed her first course of SB RT. We discussed that likely in the future she would need further treatment of another growing lesion. We discussed that we can spot well be 20 these areas as they start growing. Discussed it was really unclear how many times we could do this but is definitely worth considering given her excellent quality of life and the effect Tarceva might have on not. He would like to prolong her disease-free interval as much as possible  while still not dealing with the side effects of an agent like Tarceva. Dr. Arbutus Ped has ordered a scan for her in July and I have ordered another scan and follow up with her in October. If she is found to have progressive disease on her skin with Dr. Arbutus Ped I would set her up just for CT simulation for SBRT to a growing lesion at that time.    Jessica Hare, MD

## 2011-11-21 ENCOUNTER — Other Ambulatory Visit: Payer: Self-pay | Admitting: Gastroenterology

## 2011-11-21 DIAGNOSIS — K219 Gastro-esophageal reflux disease without esophagitis: Secondary | ICD-10-CM

## 2011-11-21 MED ORDER — ESOMEPRAZOLE MAGNESIUM 40 MG PO CPDR: 40.0000 mg | DELAYED_RELEASE_CAPSULE | Freq: Every day | ORAL | Status: DC

## 2011-12-15 DIAGNOSIS — T1500XA Foreign body in cornea, unspecified eye, initial encounter: Secondary | ICD-10-CM | POA: Diagnosis not present

## 2011-12-15 NOTE — Progress Notes (Signed)
Encounter addended by: Agnes Lawrence, RN on: 12/15/2011 12:31 PM<BR>     Documentation filed: Charges VN, Demographics Visit

## 2011-12-19 ENCOUNTER — Other Ambulatory Visit: Payer: Self-pay | Admitting: Cardiology

## 2012-01-03 ENCOUNTER — Other Ambulatory Visit: Payer: Self-pay | Admitting: Surgery

## 2012-01-03 DIAGNOSIS — L820 Inflamed seborrheic keratosis: Secondary | ICD-10-CM | POA: Diagnosis not present

## 2012-01-03 DIAGNOSIS — C44319 Basal cell carcinoma of skin of other parts of face: Secondary | ICD-10-CM | POA: Diagnosis not present

## 2012-01-03 DIAGNOSIS — L723 Sebaceous cyst: Secondary | ICD-10-CM | POA: Diagnosis not present

## 2012-01-03 DIAGNOSIS — C44519 Basal cell carcinoma of skin of other part of trunk: Secondary | ICD-10-CM | POA: Diagnosis not present

## 2012-01-16 ENCOUNTER — Other Ambulatory Visit: Payer: Medicare Other | Admitting: Lab

## 2012-01-16 ENCOUNTER — Ambulatory Visit (HOSPITAL_COMMUNITY)
Admission: RE | Admit: 2012-01-16 | Discharge: 2012-01-16 | Disposition: A | Discharge status: Home / Self Care | Payer: Medicare Other | Source: Ambulatory Visit | Attending: Internal Medicine | Admitting: Internal Medicine

## 2012-01-16 ENCOUNTER — Other Ambulatory Visit (HOSPITAL_BASED_OUTPATIENT_CLINIC_OR_DEPARTMENT_OTHER): Payer: Medicare Other

## 2012-01-16 DIAGNOSIS — C3490 Malignant neoplasm of unspecified part of unspecified bronchus or lung: Secondary | ICD-10-CM | POA: Diagnosis not present

## 2012-01-16 DIAGNOSIS — Z8541 Personal history of malignant neoplasm of cervix uteri: Secondary | ICD-10-CM | POA: Diagnosis not present

## 2012-01-16 DIAGNOSIS — R918 Other nonspecific abnormal finding of lung field: Secondary | ICD-10-CM | POA: Diagnosis not present

## 2012-01-16 LAB — CBC WITH DIFFERENTIAL/PLATELET
Basophils Absolute: 0.1 10*3/uL (ref 0.0–0.1)
Eosinophils Absolute: 0.6 10*3/uL — ABNORMAL HIGH (ref 0.0–0.5)
HCT: 42.4 % (ref 34.8–46.6)
HGB: 14.3 g/dL (ref 11.6–15.9)
LYMPH%: 24.4 % (ref 14.0–49.7)
MCV: 93.5 fL (ref 79.5–101.0)
MONO%: 12 % (ref 0.0–14.0)
NEUT#: 4.1 10*3/uL (ref 1.5–6.5)
Platelets: 171 10*3/uL (ref 145–400)

## 2012-01-16 LAB — CMP (CANCER CENTER ONLY)
Albumin: 3.5 g/dL (ref 3.3–5.5)
Alkaline Phosphatase: 46 U/L (ref 26–84)
BUN, Bld: 20 mg/dL (ref 7–22)
Glucose, Bld: 103 mg/dL (ref 73–118)
Total Bilirubin: 0.6 mg/dl (ref 0.20–1.60)

## 2012-01-16 MED ORDER — IOHEXOL 300 MG/ML  SOLN
80.0000 mL | Freq: Once | INTRAMUSCULAR | Status: AC | PRN
  Administered 2012-01-16: 80 mL via INTRAVENOUS

## 2012-01-17 ENCOUNTER — Other Ambulatory Visit (HOSPITAL_COMMUNITY): Payer: Medicare Other

## 2012-01-18 ENCOUNTER — Ambulatory Visit (HOSPITAL_BASED_OUTPATIENT_CLINIC_OR_DEPARTMENT_OTHER): Payer: Medicare Other | Admitting: Internal Medicine

## 2012-01-18 ENCOUNTER — Telehealth: Payer: Self-pay | Admitting: Internal Medicine

## 2012-01-18 VITALS — BP 145/74 | HR 53 | Temp 97.2°F | Ht 65.0 in | Wt 153.4 lb

## 2012-01-18 DIAGNOSIS — C3410 Malignant neoplasm of upper lobe, unspecified bronchus or lung: Secondary | ICD-10-CM | POA: Diagnosis not present

## 2012-01-18 NOTE — Telephone Encounter (Signed)
Gave pt appt for January lab and Ct then MD a few days after CT, Npo 4 hrs prior to scan

## 2012-01-18 NOTE — Progress Notes (Signed)
[facility] [phone]   [phone]  OFFICE PROGRESS NOTE  [name], MD [address] [place] [address]  PRINCIPAL DIAGNOSES:  1. Stage IA (T1a N0, Mx) non-small cell lung cancer, adenocarcinoma with bronchoalveolar features diagnosed in January 2010. The patient presented at that time with a right upper lobe lesion, as well as suspicious ground-glass opacities in the left lung. The tumor was negative for EGFR mutation and negative for ALK gene translocation.  2. Stage IA malignant melanoma, status post wide excision by Dr. Terri Piedra on Nov 12, 2008.  PRIOR THERAPY: Status post right upper lobectomy with lymph node dissection under the care of Dr. Edwyna Shell on May 21, 2008.   CURRENT THERAPY: Stereotactic radiotherapy to the left upper lobe lung nodule under the care of Dr. Michell Heinrich expected to be completed on 10/21/2011.  INTERVAL HISTORY: [name] 71 y.o. female returns to the clinic today for three-month followup visit accompanied by her husband. She is feeling fine today with no specific complaints. She denied having any significant chest pain or shortness breath, no cough or hemoptysis. She has no weight loss or night sweats, no nausea or vomiting. She has repeat CT scan of the chest performed recently and she is here for evaluation and discussion of her scan results.  MEDICAL HISTORY: Past Medical History  Diagnosis Date  . LBBB (left bundle branch block)   . Hypertension   . Hyperthyroidism     dr Everardo All- radioactive iodine treatment 2011- resolved  . Tremor     hand- intention tremor  . Cancer 1-10    lung- right non small cell, adenocarcinoma w/ broncho- alveolar features  . Melanoma     left leg  . Complication of anesthesia     difficulty awakening, dysrhythmia post surgery    ALLERGIES:  is allergic to meperidine hcl; morphine; penicillins; oxycodone-acetaminophen; propoxyphene-acetaminophen; and tramadol  hcl.  MEDICATIONS:  Current Outpatient Prescriptions  Medication Sig Dispense Refill  . Aspirin-Salicylamide-Caffeine (BC HEADACHE POWDER PO) Take 1 packet by mouth 3 (three) times daily.       Marland Kitchen atenolol (TENORMIN) 25 MG tablet Take 25 mg by mouth 2 (two) times daily.       . calcium-vitamin D (OSCAL WITH D) 500-200 MG-UNIT per tablet Take 1 tablet by mouth daily.       . celecoxib (CELEBREX) 200 MG capsule Take 200 mg by mouth daily as needed. For pain      . diltiazem (CARDIZEM CD) 240 MG 24 hr capsule Take 1 capsule (240 mg total) by mouth daily.  30 capsule  0  . drospirenone-estradiol (ANGELIQ) 0.5-1 MG per tablet Take 1 tablet by mouth daily.  30 tablet  11  . esomeprazole (NEXIUM) 40 MG capsule Take 1 capsule (40 mg total) by mouth daily.  30 capsule  3  . famotidine (PEPCID) 40 MG tablet Take 40 mg by mouth at bedtime as needed. For upset stomach      . hydrochlorothiazide (HYDRODIURIL) 25 MG tablet TAKE ONE (1) TABLET(S) EVERY MORNING  90 tablet  0  . Multiple Vitamin (MULITIVITAMIN WITH MINERALS) TABS Take 1 tablet by mouth daily.      . cholecalciferol (VITAMIN D) 1000 UNITS tablet Take 1,000 Units by mouth daily.       Marland Kitchen DISCONTD: diltiazem (DILACOR XR) 240 MG 24 hr capsule Take 1 capsule by mouth Daily.        SURGICAL HISTORY:  Past Surgical History  Procedure Date  . Tonsillectomy   . Conization for cervical dysplasia   . Rul lobectomy 2010    REVIEW OF SYSTEMS:  A comprehensive review of systems was negative.   PHYSICAL EXAMINATION: General appearance: alert, cooperative and no distress Neck: no adenopathy Lymph nodes: Cervical, supraclavicular, and axillary nodes normal. Resp: clear to auscultation bilaterally Cardio: regular rate and rhythm, S1, S2 normal, no murmur, click, rub or gallop GI: soft, non-tender; bowel sounds normal; no masses,  no organomegaly Extremities: extremities normal, atraumatic, no cyanosis or edema Neurologic: Alert and oriented X 3,  normal strength and tone. Normal symmetric reflexes. Normal coordination and gait  ECOG PERFORMANCE STATUS: 1 - Symptomatic but completely ambulatory  Blood pressure 145/74, pulse 53, temperature 97.2 F (36.2 C), temperature source Oral, height 5\' 5"  (1.651 m), weight 153 lb 6.4 oz (69.582 kg).  LABORATORY DATA: Lab Results  Component Value Date   WBC 7.5 01/16/2012   HGB 14.3 01/16/2012   HCT 42.4 01/16/2012   MCV 93.5 01/16/2012   PLT 171 01/16/2012      Chemistry      Component Value Date/Time   NA 137 01/16/2012 0941   NA 140 10/18/2011 1415   K 4.1 01/16/2012 0941   K 3.9 10/18/2011 1415   CL 100 01/16/2012 0941   CL 103 10/18/2011 1415   CO2 28 01/16/2012 0941   CO2 25 10/18/2011 1415   BUN 20 01/16/2012 0941   BUN 24* 10/18/2011 1415   CREATININE 1.2 01/16/2012 0941   CREATININE 1.43* 10/18/2011 1415      Component Value Date/Time   CALCIUM 9.1 01/16/2012 0941   CALCIUM 9.4 10/18/2011 1415   ALKPHOS 46 01/16/2012 0941   ALKPHOS 50 10/18/2011 1415   AST 23 01/16/2012 0941   AST 17 10/18/2011 1415   ALT 12 10/18/2011 1415   BILITOT 0.60 01/16/2012 0941   BILITOT 0.4 10/18/2011 1415       RADIOGRAPHIC STUDIES: Ct Chest W Contrast  01/16/2012  *RADIOLOGY REPORT*  Clinical Data: Lung cancer.  History of cervical cancer.  CT CHEST WITH CONTRAST  Technique:  Multidetector CT imaging of the chest was performed following the standard protocol during bolus administration of intravenous contrast.  Contrast: 80mL OMNIPAQUE IOHEXOL 300 MG/ML  SOLN  Comparison: 08/19/2011.  Findings: Sub-centimeter thyroid nodule(s) noted, too small to characterize, but most likely benign in the absence of known clinical risk factors for thyroid carcinoma.  Mediastinal lymph nodes are not enlarged by CT size criteria.  No hilar or axillary adenopathy.  Atherosclerotic calcification of the arterial vasculature including coronary arteries.  Heart size normal.  No pericardial effusion.  Small to moderate hiatal hernia.  New mild multifocal air  space consolidation and ground-glass in the left upper lobe.  Findings obscure previously seen spiculated nodule in the left upper lobe.  Scattered areas of ground-glass nodularity are again seen bilaterally, left greater than right, and appear grossly unchanged 08/19/2011.  No pleural fluid.  Airway is unremarkable.  Incidental imaging of the upper abdomen or shows no acute findings. Low attenuation lesions in the kidneys measure up to 2.3 cm off the upper pole right kidney, incompletely imaged.  No worrisome lytic or sclerotic lesions.  Degenerative changes are seen in the spine.  IMPRESSION:  1.  Presumed radiation changes in the left upper lobe.  A discrete spiculated nodule is no longer identified. 2.  Scattered foci of nodular ground-glass in the lungs bilaterally, left greater than right, unchanged in the short  interval from 08/19/2011. On follow-up examination, comparison to baseline CT chest exams is recommended.  Original Report Authenticated By: Reyes Ivan, M.D.    ASSESSMENT: This is a very pleasant 71 years old white female with recurrent non-small cell lung cancer, adenocarcinoma recently treated with stereotactic radiotherapy to the left upper lobe lung nodule. The patient is doing fine and she has no evidence for disease progression on his recent scan.   PLAN: I discussed the scan results with the patient and her husband. I recommended for her continuous observation for now with repeat CT scan of the chest with contrast in 6 months. She was advised to call me immediately she has any concerning symptoms in the interval.  All questions were answered. The patient knows to call the clinic with any problems, questions or concerns. We can certainly see the patient much sooner if necessary.

## 2012-01-30 ENCOUNTER — Other Ambulatory Visit: Payer: Self-pay | Admitting: Cardiology

## 2012-01-30 NOTE — Telephone Encounter (Signed)
.   Requested Prescriptions   Signed Prescriptions Disp Refills  . diltiazem (DILACOR XR) 240 MG 24 hr capsule 30 capsule 3    Sig: TAKE ONE CAPSULE BY MOUTH ONE TIME DAILY    Authorizing Provider: Myrtis Ser, JEFFREY D    Ordering User: Lacie Scotts

## 2012-02-20 DIAGNOSIS — Z85828 Personal history of other malignant neoplasm of skin: Secondary | ICD-10-CM | POA: Diagnosis not present

## 2012-02-21 ENCOUNTER — Other Ambulatory Visit: Payer: Self-pay | Admitting: Cardiology

## 2012-02-21 DIAGNOSIS — Z1231 Encounter for screening mammogram for malignant neoplasm of breast: Secondary | ICD-10-CM | POA: Diagnosis not present

## 2012-02-23 ENCOUNTER — Telehealth: Payer: Self-pay | Admitting: *Deleted

## 2012-02-23 NOTE — Telephone Encounter (Signed)
Mammogram results given to Dr Donnald Garre to review.  SLJ

## 2012-02-28 DIAGNOSIS — H2510 Age-related nuclear cataract, unspecified eye: Secondary | ICD-10-CM | POA: Diagnosis not present

## 2012-03-19 ENCOUNTER — Other Ambulatory Visit: Payer: Self-pay | Admitting: Cardiology

## 2012-03-19 ENCOUNTER — Other Ambulatory Visit: Payer: Self-pay | Admitting: Internal Medicine

## 2012-04-09 ENCOUNTER — Encounter: Payer: Self-pay | Admitting: Cardiology

## 2012-04-09 ENCOUNTER — Ambulatory Visit (INDEPENDENT_AMBULATORY_CARE_PROVIDER_SITE_OTHER): Payer: Medicare Other | Admitting: Cardiology

## 2012-04-09 VITALS — BP 138/72 | HR 59 | Ht 66.0 in | Wt 155.1 lb

## 2012-04-09 DIAGNOSIS — I447 Left bundle-branch block, unspecified: Secondary | ICD-10-CM | POA: Diagnosis not present

## 2012-04-09 DIAGNOSIS — C3410 Malignant neoplasm of upper lobe, unspecified bronchus or lung: Secondary | ICD-10-CM

## 2012-04-09 DIAGNOSIS — I10 Essential (primary) hypertension: Secondary | ICD-10-CM

## 2012-04-09 NOTE — Assessment & Plan Note (Signed)
Blood pressure is controlled. No change in therapy. 

## 2012-04-09 NOTE — Patient Instructions (Addendum)
Your physician recommends that you continue on your current medications as directed. Please refer to the Current Medication list given to you today.  Your physician wants you to follow-up in: 1 year. You will receive a reminder letter in the mail two months in advance. If you don't receive a letter, please call our office to schedule the follow-up appointment.  

## 2012-04-09 NOTE — Assessment & Plan Note (Signed)
The patient looks great with her lung cancer and most recent radiation treatment. She has an excellent outlook. No further workup is needed.

## 2012-04-09 NOTE — Assessment & Plan Note (Signed)
Left bundle branch block is old. No workup is needed.

## 2012-04-09 NOTE — Progress Notes (Signed)
HPI  I have followed this delightful lady for many many years. I follow her for hypertension and left bundle branch block. Her blood pressures under control. Historically her left bundle branch block was an incidental finding. [name] did not have any significant coronary artery disease. [name] does have a lung cancer. This has been treated in fortunately [name] is stable. Most recently [name] had a recurrence and received a very specialized, localized radiation that has helped her great deal. [name] looks well and is fully active.  Allergies  Allergen Reactions  . Meperidine Hcl Anaphylaxis  . Morphine Other (See Comments)    Stopped breathing  . Penicillins Other (See Comments)    Difficulty breathing  . Oxycodone-Acetaminophen Itching       . Propoxyphene-Acetaminophen Nausea And Vomiting  . Tramadol Hcl Nausea And Vomiting    Current Outpatient Prescriptions  Medication Sig Dispense Refill  . Aspirin-Salicylamide-Caffeine (BC HEADACHE POWDER PO) Take 1 packet by mouth 3 (three) times daily.       Marland Kitchen atenolol (TENORMIN) 25 MG tablet Take 25 mg by mouth 2 (two) times daily.       Marland Kitchen atenolol (TENORMIN) 25 MG tablet TAKE ONE TABLET BY MOUTH TWICE DAILY  60 tablet  3  . calcium-vitamin D (OSCAL WITH D) 500-200 MG-UNIT per tablet Take 1 tablet by mouth daily.       . celecoxib (CELEBREX) 200 MG capsule Take 200 mg by mouth daily as needed. For pain      . cholecalciferol (VITAMIN D) 1000 UNITS tablet Take 1,000 Units by mouth daily.       Marland Kitchen diltiazem (CARDIZEM CD) 240 MG 24 hr capsule Take 1 capsule (240 mg total) by mouth daily.  30 capsule  0  . diltiazem (DILACOR XR) 240 MG 24 hr capsule TAKE ONE CAPSULE BY MOUTH ONE TIME DAILY  30 capsule  3  . drospirenone-estradiol (ANGELIQ) 0.5-1 MG per tablet Take 1 tablet by mouth daily.  30 tablet  11  . famotidine (PEPCID) 40 MG tablet Take 40 mg by mouth at bedtime as needed. For upset stomach      . hydrochlorothiazide (HYDRODIURIL) 25 MG tablet TAKE ONE  TABLET BY MOUTH IN THE MORNING  90 tablet  0  . Multiple Vitamin (MULITIVITAMIN WITH MINERALS) TABS Take 1 tablet by mouth daily.      Marland Kitchen NEXIUM 40 MG capsule TAKE ONE CAPSULE BY MOUTH ONE TIME DAILY  30 each  3    History   Social History  . Marital Status: Married    Spouse Name: N/A    Number of Children: 2  . Years of Education: N/A   Occupational History  . homemaker    Social History Main Topics  . Smoking status: Former Smoker -- 1.0 packs/day for 17 years    Types: Cigarettes    Quit date: 09/28/1975  . Smokeless tobacco: Never Used   Comment: 33 yrs ago  . Alcohol Use: Yes     glass wine daily  . Drug Use: No  . Sexually Active: Not on file   Other Topics Concern  . Not on file   Social History Narrative   [employer] graduateMarried [id number] grandchildren and 2 step-grandchildrenMarriage in good healthPhysician Roster:Oncologist- Dr Marcelyn Bruins- Dr Doylene Canning- Dr Sonia Baller- Dr Terri Piedra    Family History  Problem Relation Age of Onset  . Asthma Mother   . Heart disease Mother   . Thyroid disease Daughter     hypothyroidism  . Cancer Maternal  Uncle     lung  . Cancer Maternal Grandfather     lung  . Diabetes Neg Hx   . Coronary artery disease Neg Hx     Past Medical History  Diagnosis Date  . LBBB (left bundle branch block)   . Hypertension   . Hyperthyroidism     dr Everardo All- radioactive iodine treatment 2011- resolved  . Tremor     hand- intention tremor  . Cancer 1-10    lung- right non small cell, adenocarcinoma w/ broncho- alveolar features  . Melanoma     left leg  . Complication of anesthesia     difficulty awakening, dysrhythmia post surgery    Past Surgical History  Procedure Date  . Tonsillectomy   . Conization for cervical dysplasia   . Rul lobectomy 2010    ROS   Patient denies fever, chills, headache, sweats, rash, change in vision, change in hearing, chest pain, cough, nausea vomiting, urinary symptoms. All other systems are reviewed  and are negative.  PHYSICAL EXAM  Patient is stable. [name] is oriented to person time and place. Affect is normal. There is no jugulovenous distention. Lungs are clear. Respiratory effort is nonlabored. Cardiac exam reveals S1 and S2. There are no clicks or significant murmurs. The abdomen is soft. There is no peripheral edema.  Filed Vitals:   04/09/12 1535  BP: 138/72  Pulse: 59  Height: 5\' 6"  (1.676 m)  Weight: 155 lb 1.9 oz (70.362 kg)   EKG is done today and reviewed by me. [name] has old left bundle branch block.  ASSESSMENT & PLAN

## 2012-04-12 ENCOUNTER — Ambulatory Visit: Payer: Medicare Other | Admitting: Radiation Oncology

## 2012-04-12 ENCOUNTER — Encounter: Payer: Self-pay | Admitting: Radiation Oncology

## 2012-04-12 ENCOUNTER — Ambulatory Visit
Admission: RE | Admit: 2012-04-12 | Discharge: 2012-04-12 | Disposition: A | Discharge status: Home / Self Care | Payer: Medicare Other | Source: Ambulatory Visit | Attending: Radiation Oncology | Admitting: Radiation Oncology

## 2012-04-12 VITALS — BP 149/64 | HR 61 | Temp 98.2°F | Resp 20 | Wt 155.6 lb

## 2012-04-12 DIAGNOSIS — C3410 Malignant neoplasm of upper lobe, unspecified bronchus or lung: Secondary | ICD-10-CM

## 2012-04-12 DIAGNOSIS — C3490 Malignant neoplasm of unspecified part of unspecified bronchus or lung: Secondary | ICD-10-CM | POA: Diagnosis not present

## 2012-04-12 NOTE — Progress Notes (Signed)
Pt denies cough, SOB, pain, fatigue, loss of appetite.

## 2012-04-12 NOTE — Progress Notes (Signed)
Department of Radiation Oncology  Phone:  [phone] Fax:        [phone]   Name: [name]   DOB: 06-15-1941  MRN: [id number]    Date: 04/12/2012  Follow Up Visit Note  Diagnosis: Multifocal bronchoalveolar carcinoma  Interval since last radiation: 6 months  Interval History: [name] presents today for routine followup.  [name] had scans with Dr. Arbutus Ped in July which showed stability of [name] disease. [name] reports no weight loss cough or shortness of breath. [name] is very active and is looking forward to a trip to [place].  Allergies:  Allergies  Allergen Reactions  . Meperidine Hcl Anaphylaxis  . Morphine Other (See Comments)    Stopped breathing  . Penicillins Other (See Comments)    Difficulty breathing  . Oxycodone-Acetaminophen Itching       . Propoxyphene-Acetaminophen Nausea And Vomiting  . Tramadol Hcl Nausea And Vomiting    Medications:  Current Outpatient Prescriptions  Medication Sig Dispense Refill  . Aspirin-Salicylamide-Caffeine (BC HEADACHE POWDER PO) Take 1 packet by mouth 3 (three) times daily.       Marland Kitchen atenolol (TENORMIN) 25 MG tablet Take 25 mg by mouth 2 (two) times daily.       Marland Kitchen atenolol (TENORMIN) 25 MG tablet TAKE ONE TABLET BY MOUTH TWICE DAILY  60 tablet  3  . calcium-vitamin D (OSCAL WITH D) 500-200 MG-UNIT per tablet Take 1 tablet by mouth daily.       . celecoxib (CELEBREX) 200 MG capsule Take 200 mg by mouth daily as needed. For pain      . cholecalciferol (VITAMIN D) 1000 UNITS tablet Take 1,000 Units by mouth daily.       Marland Kitchen diltiazem (CARDIZEM CD) 240 MG 24 hr capsule Take 1 capsule (240 mg total) by mouth daily.  30 capsule  0  . drospirenone-estradiol (ANGELIQ) 0.5-1 MG per tablet Take 1 tablet by mouth daily.  30 tablet  11  . famotidine (PEPCID) 40 MG tablet Take 40 mg by mouth at bedtime as needed. For upset stomach      . hydrochlorothiazide (HYDRODIURIL) 25 MG tablet TAKE ONE TABLET BY MOUTH IN THE MORNING  90 tablet   0  . Multiple Vitamin (MULITIVITAMIN WITH MINERALS) TABS Take 1 tablet by mouth daily.      Marland Kitchen NEXIUM 40 MG capsule TAKE ONE CAPSULE BY MOUTH ONE TIME DAILY  30 each  3    Physical Exam:   weight is 155 lb 9.6 oz (70.58 kg). [name] oral temperature is 98.2 F (36.8 C). [name] blood pressure is 149/64 and [name] pulse is 61. [name] respiration is 20.  [name] is pleasant female in no distress [name] appears younger than [name] stated age  IMPRESSION: [name] is a 71 y.o. female with multifocal bronchoalveolar carcinoma status post SBRT to a growing lesion with no evidence of progressive disease  PLAN:  I talked to [name] and [name] husband today. I think it's reasonable to scan [name] in January and skipped this 3 month interval scan. [name] is having no clinical signs of progression. [name] declined Tarceva therapy and I think we can just wait and perform SBRT to solitary lesions that are growing. We discussed that if [name] had multiple lesions growing Tarceva may be an option at that time. I have not scheduled followup with [name]. I think it would be fine if [name] has a growing lesion 2 to schedule [name] for [name] SBRT simulation and no followup. [name] is seeing Dr.  Mohamed in January with scans. I've asked [name] to call me with any questions in the interim    Lurline Hare, MD

## 2012-05-08 ENCOUNTER — Other Ambulatory Visit: Payer: Self-pay | Admitting: Surgery

## 2012-05-08 DIAGNOSIS — C44611 Basal cell carcinoma of skin of unspecified upper limb, including shoulder: Secondary | ICD-10-CM | POA: Diagnosis not present

## 2012-05-08 DIAGNOSIS — L570 Actinic keratosis: Secondary | ICD-10-CM | POA: Diagnosis not present

## 2012-05-08 DIAGNOSIS — D485 Neoplasm of uncertain behavior of skin: Secondary | ICD-10-CM | POA: Diagnosis not present

## 2012-05-08 DIAGNOSIS — Z85820 Personal history of malignant melanoma of skin: Secondary | ICD-10-CM | POA: Diagnosis not present

## 2012-05-08 DIAGNOSIS — C44519 Basal cell carcinoma of skin of other part of trunk: Secondary | ICD-10-CM | POA: Diagnosis not present

## 2012-05-08 DIAGNOSIS — D235 Other benign neoplasm of skin of trunk: Secondary | ICD-10-CM | POA: Diagnosis not present

## 2012-06-05 ENCOUNTER — Other Ambulatory Visit: Payer: Self-pay

## 2012-06-05 MED ORDER — DILTIAZEM HCL ER COATED BEADS 240 MG PO CP24: 240.0000 mg | ORAL_CAPSULE | Freq: Every day | ORAL | Status: DC

## 2012-06-06 DIAGNOSIS — M25559 Pain in unspecified hip: Secondary | ICD-10-CM | POA: Diagnosis not present

## 2012-06-11 ENCOUNTER — Other Ambulatory Visit: Payer: Self-pay | Admitting: Cardiology

## 2012-06-11 MED ORDER — HYDROCHLOROTHIAZIDE 25 MG PO TABS: 25.0000 mg | ORAL_TABLET | Freq: Every morning | ORAL | Status: DC

## 2012-06-15 DIAGNOSIS — M25559 Pain in unspecified hip: Secondary | ICD-10-CM | POA: Diagnosis not present

## 2012-06-18 ENCOUNTER — Other Ambulatory Visit: Payer: Self-pay

## 2012-06-18 MED ORDER — ATENOLOL 25 MG PO TABS: 25.0000 mg | ORAL_TABLET | Freq: Every day | ORAL | Status: DC

## 2012-06-19 DIAGNOSIS — Z85828 Personal history of other malignant neoplasm of skin: Secondary | ICD-10-CM | POA: Diagnosis not present

## 2012-06-28 ENCOUNTER — Other Ambulatory Visit: Payer: Self-pay

## 2012-06-28 MED ORDER — ATENOLOL 25 MG PO TABS: 25.0000 mg | ORAL_TABLET | Freq: Two times a day (BID) | ORAL | Status: DC

## 2012-07-05 ENCOUNTER — Other Ambulatory Visit: Payer: Self-pay | Admitting: Internal Medicine

## 2012-07-13 ENCOUNTER — Ambulatory Visit (HOSPITAL_COMMUNITY)
Admission: RE | Admit: 2012-07-13 | Discharge: 2012-07-13 | Disposition: A | Discharge status: Home / Self Care | Payer: Medicare Other | Source: Ambulatory Visit | Attending: Internal Medicine | Admitting: Internal Medicine

## 2012-07-13 ENCOUNTER — Other Ambulatory Visit (HOSPITAL_BASED_OUTPATIENT_CLINIC_OR_DEPARTMENT_OTHER): Payer: Medicare Other | Admitting: Lab

## 2012-07-13 DIAGNOSIS — C3410 Malignant neoplasm of upper lobe, unspecified bronchus or lung: Secondary | ICD-10-CM | POA: Diagnosis not present

## 2012-07-13 DIAGNOSIS — C3490 Malignant neoplasm of unspecified part of unspecified bronchus or lung: Secondary | ICD-10-CM | POA: Diagnosis not present

## 2012-07-13 DIAGNOSIS — I2510 Atherosclerotic heart disease of native coronary artery without angina pectoris: Secondary | ICD-10-CM | POA: Diagnosis not present

## 2012-07-13 LAB — CBC WITH DIFFERENTIAL/PLATELET
BASO%: 0.5 % (ref 0.0–2.0)
Basophils Absolute: 0.1 10*3/uL (ref 0.0–0.1)
EOS%: 4.4 % (ref 0.0–7.0)
HGB: 14.8 g/dL (ref 11.6–15.9)
MCH: 31.8 pg (ref 25.1–34.0)
RBC: 4.65 10*6/uL (ref 3.70–5.45)
RDW: 14.2 % (ref 11.2–14.5)
lymph#: 1.7 10*3/uL (ref 0.9–3.3)
nRBC: 0 % (ref 0–0)

## 2012-07-13 LAB — COMPREHENSIVE METABOLIC PANEL (CC13)
AST: 16 U/L (ref 5–34)
Albumin: 3.5 g/dL (ref 3.5–5.0)
BUN: 22 mg/dL (ref 7.0–26.0)
Calcium: 9.8 mg/dL (ref 8.4–10.4)
Chloride: 106 mEq/L (ref 98–107)
Glucose: 101 mg/dl — ABNORMAL HIGH (ref 70–99)
Potassium: 3.8 mEq/L (ref 3.5–5.1)
Sodium: 143 mEq/L (ref 136–145)
Total Protein: 6.5 g/dL (ref 6.4–8.3)

## 2012-07-13 MED ORDER — IOHEXOL 300 MG/ML  SOLN
80.0000 mL | Freq: Once | INTRAMUSCULAR | Status: AC | PRN
  Administered 2012-07-13: 80 mL via INTRAVENOUS

## 2012-07-17 ENCOUNTER — Encounter: Payer: Self-pay | Admitting: Internal Medicine

## 2012-07-17 ENCOUNTER — Telehealth: Payer: Self-pay | Admitting: Internal Medicine

## 2012-07-17 ENCOUNTER — Ambulatory Visit (HOSPITAL_BASED_OUTPATIENT_CLINIC_OR_DEPARTMENT_OTHER): Payer: Medicare Other | Admitting: Internal Medicine

## 2012-07-17 ENCOUNTER — Ambulatory Visit (HOSPITAL_BASED_OUTPATIENT_CLINIC_OR_DEPARTMENT_OTHER): Payer: Medicare Other | Admitting: Lab

## 2012-07-17 VITALS — BP 140/62 | HR 61 | Temp 96.8°F | Resp 20 | Ht 66.0 in | Wt 157.2 lb

## 2012-07-17 DIAGNOSIS — C3410 Malignant neoplasm of upper lobe, unspecified bronchus or lung: Secondary | ICD-10-CM

## 2012-07-17 DIAGNOSIS — C3490 Malignant neoplasm of unspecified part of unspecified bronchus or lung: Secondary | ICD-10-CM

## 2012-07-17 DIAGNOSIS — R911 Solitary pulmonary nodule: Secondary | ICD-10-CM | POA: Diagnosis not present

## 2012-07-17 DIAGNOSIS — C439 Malignant melanoma of skin, unspecified: Secondary | ICD-10-CM | POA: Diagnosis not present

## 2012-07-17 DIAGNOSIS — R599 Enlarged lymph nodes, unspecified: Secondary | ICD-10-CM

## 2012-07-17 LAB — COMPREHENSIVE METABOLIC PANEL (CC13)
ALT: 13 U/L (ref 0–55)
AST: 16 U/L (ref 5–34)
Alkaline Phosphatase: 57 U/L (ref 40–150)
Creatinine: 1.4 mg/dL — ABNORMAL HIGH (ref 0.6–1.1)
Sodium: 140 mEq/L (ref 136–145)
Total Bilirubin: 0.48 mg/dL (ref 0.20–1.20)
Total Protein: 7.1 g/dL (ref 6.4–8.3)

## 2012-07-17 NOTE — Patient Instructions (Signed)
The scan showed evidence for mild disease progression. We discussed several treatment options. We will order Veristrat test first and decide the next step based on its results. Followup in 3 months with repeat CT scan of the chest

## 2012-07-17 NOTE — Progress Notes (Signed)
[facility] [phone]   [phone]  OFFICE PROGRESS NOTE  [name], MD [address] [place] [address]  PRINCIPAL DIAGNOSES:  1. Stage IA (T1a N0, Mx) non-small cell lung cancer, adenocarcinoma with bronchoalveolar features diagnosed in January 2010. The patient presented at that time with a right upper lobe lesion, as well as suspicious ground-glass opacities in the left lung. The tumor was negative for EGFR mutation and negative for ALK gene translocation.  2. Stage IA malignant melanoma, status post wide excision by Dr. Terri Piedra on Nov 12, 2008.  PRIOR THERAPY:  1) Status post right upper lobectomy with lymph node dissection under the care of Dr. Edwyna Shell on May 21, 2008.  2) Stereotactic radiotherapy to the left upper lobe lung nodule under the care of Dr. Michell Heinrich expected to be completed on 10/21/2011.  CURRENT THERAPY: Observation.  INTERVAL HISTORY: [name] 72 y.o. female returns to the clinic today for followup visit accompanied by her husband. The patient is feeling fine today with no specific complaints. She continues to have shortness breath with exertion. She denied having any significant chest pain, cough or hemoptysis. The patient had repeat CT scan of the chest performed recently and she is here for evaluation and discussion of her scan results.  MEDICAL HISTORY: Past Medical History  Diagnosis Date  . LBBB (left bundle branch block)   . Hypertension   . Hyperthyroidism     dr Everardo All- radioactive iodine treatment 2011- resolved  . Tremor     hand- intention tremor  . Cancer 1-10    lung- right non small cell, adenocarcinoma w/ broncho- alveolar features  . Melanoma     left leg  . Complication of anesthesia     difficulty awakening, dysrhythmia post surgery    ALLERGIES:  is allergic to meperidine hcl; morphine; penicillins; oxycodone-acetaminophen; propoxyphene-acetaminophen; and tramadol  hcl.  MEDICATIONS:  Current Outpatient Prescriptions  Medication Sig Dispense Refill  . ANGELIQ 0.5-1 MG per tablet TAKE ONE TABLET BY MOUTH ONE TIME DAILY  28 tablet  0  . Aspirin-Salicylamide-Caffeine (BC HEADACHE POWDER PO) Take 1 packet by mouth 3 (three) times daily.       Marland Kitchen atenolol (TENORMIN) 25 MG tablet Take 1 tablet (25 mg total) by mouth 2 (two) times daily.  60 tablet  3  . calcium-vitamin D (OSCAL WITH D) 500-200 MG-UNIT per tablet Take 1 tablet by mouth daily.       . celecoxib (CELEBREX) 200 MG capsule Take 200 mg by mouth daily as needed. For pain      . cholecalciferol (VITAMIN D) 1000 UNITS tablet Take 1,000 Units by mouth daily.       Marland Kitchen diltiazem (CARDIZEM CD) 240 MG 24 hr capsule Take 1 capsule (240 mg total) by mouth daily.  30 capsule  11  . hydrochlorothiazide (HYDRODIURIL) 25 MG tablet Take 1 tablet (25 mg total) by mouth every morning.  90 tablet  0  . Multiple Vitamin (MULITIVITAMIN WITH MINERALS) TABS Take 1 tablet by mouth daily.      Marland Kitchen NEXIUM 40 MG capsule TAKE ONE CAPSULE BY MOUTH ONE TIME DAILY  30 each  3  . diltiazem (DILACOR XR) 240 MG 24 hr capsule Take 240 mg by mouth Daily.      . famotidine (PEPCID) 40 MG tablet Take 40 mg by mouth at bedtime as needed. For upset stomach        SURGICAL HISTORY:  Past  Surgical History  Procedure Date  . Tonsillectomy   . Conization for cervical dysplasia   . Rul lobectomy 2010    REVIEW OF SYSTEMS:  A comprehensive review of systems was negative except for: Respiratory: positive for dyspnea on exertion   PHYSICAL EXAMINATION: General appearance: alert, cooperative and no distress Head: Normocephalic, without obvious abnormality, atraumatic Neck: no adenopathy Lymph nodes: Cervical, supraclavicular, and axillary nodes normal. Resp: clear to auscultation bilaterally Cardio: regular rate and rhythm, S1, S2 normal, no murmur, click, rub or gallop GI: soft, non-tender; bowel sounds normal; no masses,  no  organomegaly Extremities: extremities normal, atraumatic, no cyanosis or edema  ECOG PERFORMANCE STATUS: 1 - Symptomatic but completely ambulatory  Blood pressure 140/62, pulse 61, temperature 96.8 F (36 C), temperature source Oral, resp. rate 20, height 5\' 6"  (1.676 m), weight 157 lb 3.2 oz (71.305 kg).  LABORATORY DATA: Lab Results  Component Value Date   WBC 9.8 07/13/2012   HGB 14.8 07/13/2012   HCT 44.0 07/13/2012   MCV 94.6 07/13/2012   PLT 198 07/13/2012      Chemistry      Component Value Date/Time   NA 143 07/13/2012 0925   NA 137 01/16/2012 0941   NA 140 10/18/2011 1415   K 3.8 07/13/2012 0925   K 4.1 01/16/2012 0941   K 3.9 10/18/2011 1415   CL 106 07/13/2012 0925   CL 100 01/16/2012 0941   CL 103 10/18/2011 1415   CO2 25 07/13/2012 0925   CO2 28 01/16/2012 0941   CO2 25 10/18/2011 1415   BUN 22.0 07/13/2012 0925   BUN 20 01/16/2012 0941   BUN 24* 10/18/2011 1415   CREATININE 1.4* 07/13/2012 0925   CREATININE 1.2 01/16/2012 0941   CREATININE 1.43* 10/18/2011 1415      Component Value Date/Time   CALCIUM 9.8 07/13/2012 0925   CALCIUM 9.1 01/16/2012 0941   CALCIUM 9.4 10/18/2011 1415   ALKPHOS 52 07/13/2012 0925   ALKPHOS 46 01/16/2012 0941   ALKPHOS 50 10/18/2011 1415   AST 16 07/13/2012 0925   AST 23 01/16/2012 0941   AST 17 10/18/2011 1415   ALT 15 07/13/2012 0925   ALT 12 10/18/2011 1415   BILITOT 0.41 07/13/2012 0925   BILITOT 0.60 01/16/2012 0941   BILITOT 0.4 10/18/2011 1415       RADIOGRAPHIC STUDIES: Ct Chest W Contrast  07/13/2012  *RADIOLOGY REPORT*  Clinical Data: Lung cancer.  Right upper lobectomy.  CT CHEST WITH CONTRAST  Technique:  Multidetector CT imaging of the chest was performed following the standard protocol during bolus administration of intravenous contrast.  Contrast: 80mL OMNIPAQUE IOHEXOL 300 MG/ML  SOLN  Comparison: 01/16/2012  Findings: Stable heterogeneous thyroid gland noted.  Aortic atherosclerosis is present.  AP window lymph node short axis 1.1 cm (formerly 0.7 cm).  No other  pathologic thoracic adenopathy.  Moderate sized hiatal hernia noted.  Stable left adrenal fullness without adrenal mass.  Left anterior descending coronary artery atherosclerosis noted. Suspected left kidney upper pole scarring.  Crescentic band like density in the left upper lobe is reduced in thickness compared to prior.  Faint associated ground-glass opacity noted.  Appearance favors radiation pneumonitis.  The previous nodule is inseparable from the band-like opacity but does not appear progressively increased.  Emphysema noted.  Slow growing ground-glass opacities are observed in the left upper lobe and adjacent left lower lobe on image 26 of series 5, with the left upper lobe opacity measuring about 2.0 x  1.0 cm (formerly 0.9 x 0.8 cm on 08/27/2004) and the adjacent lower lobe ground-glass opacity measuring 1.9 x 1.3 cm (formerly 1.0 x 0.6 cm on 08/27/2004).  Several other ground-glass nodules are present in the left upper lobe, and also in the right lower lobe, similar to recent priors.  IMPRESSION:  1.  Reduced thickness of curvilinear densities in the left upper lobe compatible with radiation therapy related findings.  The solid nodule previously seen in the left upper lobe is inseparable from the curvilinear density but does not appear progressive in the interim. 2.  Scattered ground-glass opacities in the lungs.  Several of these are slowly progressive over past 7 years, favoring low grade adenocarcinoma. 3.  Coronary artery atherosclerosis. 4.  Enlarged size of an AP window lymph node, now measuring 1.1 cm in short axis (previously 0.7 cm).  Malignant involvement of this node is not excluded.   Original Report Authenticated By: Gaylyn Rong, M.D.     ASSESSMENT: This is a very pleasant 72 years old white female with recurrent non-small cell lung cancer initially diagnosed as stage IA, adenocarcinoma with negative EGFR mutation and negative ALK gene translocation. She was recently treated with  stereotactic radiotherapy to left upper lobe lung nodule. The patient continues to have evidence for disease progression especially in the left lung nodule as well as enlarged AP window lymph node.  PLAN: I discussed the scan results and showed the images to the patient and her husband. I gave her several option for management of her condition including continuous observation with repeat CT scan of the chest in 3 months versus consideration of treatment with oral Tarceva after Veristrat test versus systemic chemotherapy with carboplatin and Alimta versus concurrent chemoradiation with weekly carboplatin and paclitaxel if she continues to have evidence for disease progression on the upcoming scan for her locally advanced disease. After a lengthy discussion about all the options, the patient would like to proceed with the Veristrat test to see if she would be a candidate for treatment with Tarceva if she has Veristrat good. In the meantime she would like to continue on observation for now and repeat CT scan of the chest in 3 months. I will arrange for the patient a followup visit at that time. She is currently not interested in proceeding with chemotherapy. The patient was advised to call me immediately she has any concerning symptoms in the interval  All questions were answered. The patient knows to call the clinic with any problems, questions or concerns. We can certainly see the patient much sooner if necessary.  I spent 15 minutes counseling the patient face to face. The total time spent in the appointment was 25 minutes.

## 2012-07-17 NOTE — Telephone Encounter (Signed)
appts made and printed for pt aom Pt aware that she will get a call  With her scan appt

## 2012-07-18 DIAGNOSIS — C3490 Malignant neoplasm of unspecified part of unspecified bronchus or lung: Secondary | ICD-10-CM | POA: Diagnosis not present

## 2012-07-23 LAB — VERISTRAT

## 2012-07-24 ENCOUNTER — Telehealth: Payer: Self-pay | Admitting: *Deleted

## 2012-07-24 ENCOUNTER — Telehealth: Payer: Self-pay | Admitting: Internal Medicine

## 2012-07-24 NOTE — Telephone Encounter (Signed)
pt called in to make an appt re;labs results,make appt ok per sj      anne

## 2012-07-24 NOTE — Telephone Encounter (Signed)
Pt called wanting to know veristrat rest results.  Per Dr Donnald Garre, veristrat= good. Pt verbalized understanding,  Will keep appts as scheduled at this time.  SLJ

## 2012-08-01 ENCOUNTER — Encounter: Payer: Self-pay | Admitting: Internal Medicine

## 2012-08-01 ENCOUNTER — Ambulatory Visit (HOSPITAL_BASED_OUTPATIENT_CLINIC_OR_DEPARTMENT_OTHER): Payer: Medicare Other | Admitting: Internal Medicine

## 2012-08-01 ENCOUNTER — Other Ambulatory Visit: Payer: Self-pay | Admitting: Internal Medicine

## 2012-08-01 VITALS — BP 185/73 | HR 70 | Temp 97.8°F | Resp 18 | Ht 66.0 in | Wt 155.6 lb

## 2012-08-01 DIAGNOSIS — Z85820 Personal history of malignant melanoma of skin: Secondary | ICD-10-CM | POA: Diagnosis not present

## 2012-08-01 DIAGNOSIS — C3410 Malignant neoplasm of upper lobe, unspecified bronchus or lung: Secondary | ICD-10-CM

## 2012-08-01 NOTE — Progress Notes (Signed)
[facility] [phone]   [phone]) [phone]  OFFICE PROGRESS NOTE  [name], MD [address] [place] [address]  PRINCIPAL DIAGNOSES:  1. Recurrent non-small cell lung cancer initially diagnosed as Stage IA (T1a N0, Mx)  adenocarcinoma with bronchoalveolar features diagnosed in January 2010. The patient presented at that time with a right upper lobe lesion, as well as suspicious ground-glass opacities in the left lung. The tumor was negative for EGFR mutation and negative for ALK gene translocation. Veristrat test Good. 2. Stage IA malignant melanoma, status post wide excision by Dr. Terri Piedra on Nov 12, 2008.  PRIOR THERAPY:  1) Status post right upper lobectomy with lymph node dissection under the care of Dr. Edwyna Shell on May 21, 2008.  2) Stereotactic radiotherapy to the left upper lobe lung nodule under the care of Dr. Michell Heinrich expected to be completed on 10/21/2011.   CURRENT THERAPY: Observation.  INTERVAL HISTORY: [name] 72 y.o. female returns to the clinic today for followup visit accompanied by her husband based on her request. The patient had several questions after the recent Veristrat test. She is feeling fine today with no specific complaints except for mild fatigue and shortness breath with exertion. She denied having any significant weight loss or night sweats. The patient had a Veristrat test that was reported to be good.  MEDICAL HISTORY: Past Medical History  Diagnosis Date  . LBBB (left bundle branch block)   . Hypertension   . Hyperthyroidism     dr Everardo All- radioactive iodine treatment 2011- resolved  . Tremor     hand- intention tremor  . Cancer 1-10    lung- right non small cell, adenocarcinoma w/ broncho- alveolar features  . Melanoma     left leg  . Complication of anesthesia     difficulty awakening, dysrhythmia post surgery    ALLERGIES:  is allergic to meperidine hcl; morphine; penicillins;  oxycodone-acetaminophen; propoxyphene-acetaminophen; and tramadol hcl.  MEDICATIONS:  Current Outpatient Prescriptions  Medication Sig Dispense Refill  . ANGELIQ 0.5-1 MG per tablet TAKE ONE TABLET BY MOUTH ONE TIME DAILY  28 tablet  0  . ANGELIQ 0.5-1 MG per tablet TAKE ONE TABLET BY MOUTH ONE TIME DAILY  28 tablet  0  . Aspirin-Salicylamide-Caffeine (BC HEADACHE POWDER PO) Take 1 packet by mouth 3 (three) times daily.       Marland Kitchen atenolol (TENORMIN) 25 MG tablet Take 1 tablet (25 mg total) by mouth 2 (two) times daily.  60 tablet  3  . calcium-vitamin D (OSCAL WITH D) 500-200 MG-UNIT per tablet Take 1 tablet by mouth daily.       . celecoxib (CELEBREX) 200 MG capsule Take 200 mg by mouth daily as needed. For pain      . cholecalciferol (VITAMIN D) 1000 UNITS tablet Take 1,000 Units by mouth daily.       Marland Kitchen diltiazem (CARDIZEM CD) 240 MG 24 hr capsule Take 1 capsule (240 mg total) by mouth daily.  30 capsule  11  . diltiazem (DILACOR XR) 240 MG 24 hr capsule Take 240 mg by mouth Daily.      . hydrochlorothiazide (HYDRODIURIL) 25 MG tablet Take 1 tablet (25 mg total) by mouth every morning.  90 tablet  0  . Multiple Vitamin (MULITIVITAMIN WITH MINERALS) TABS Take 1 tablet by mouth daily.      Marland Kitchen NEXIUM 40 MG capsule TAKE ONE CAPSULE BY MOUTH ONE TIME DAILY  30 each  3  . famotidine (PEPCID) 40 MG tablet Take 40 mg by mouth at bedtime as needed. For upset stomach        SURGICAL HISTORY:  Past Surgical History  Procedure Date  . Tonsillectomy   . Conization for cervical dysplasia   . Rul lobectomy 2010    REVIEW OF SYSTEMS:  A comprehensive review of systems was negative except for: Constitutional: positive for fatigue Respiratory: positive for dyspnea on exertion   PHYSICAL EXAMINATION: General appearance: alert, cooperative and no distress Head: Normocephalic, without obvious abnormality, atraumatic Neck: no adenopathy Lymph nodes: Cervical, supraclavicular, and axillary nodes  normal. Resp: clear to auscultation bilaterally Back: symmetric, no curvature. ROM normal. No CVA tenderness. Cardio: regular rate and rhythm, S1, S2 normal, no murmur, click, rub or gallop GI: soft, non-tender; bowel sounds normal; no masses,  no organomegaly Extremities: extremities normal, atraumatic, no cyanosis or edema Neurologic: Alert and oriented X 3, normal strength and tone. Normal symmetric reflexes. Normal coordination and gait  ECOG PERFORMANCE STATUS: 1 - Symptomatic but completely ambulatory  Blood pressure 185/73, pulse 70, temperature 97.8 F (36.6 C), temperature source Oral, resp. rate 18, height 5\' 6"  (1.676 m), weight 155 lb 9.6 oz (70.58 kg).  LABORATORY DATA: Lab Results  Component Value Date   WBC 9.8 07/13/2012   HGB 14.8 07/13/2012   HCT 44.0 07/13/2012   MCV 94.6 07/13/2012   PLT 198 07/13/2012      Chemistry      Component Value Date/Time   NA 140 07/17/2012 1434   NA 137 01/16/2012 0941   NA 140 10/18/2011 1415   K 3.6 07/17/2012 1434   K 4.1 01/16/2012 0941   K 3.9 10/18/2011 1415   CL 105 07/17/2012 1434   CL 100 01/16/2012 0941   CL 103 10/18/2011 1415   CO2 26 07/17/2012 1434   CO2 28 01/16/2012 0941   CO2 25 10/18/2011 1415   BUN 21.0 07/17/2012 1434   BUN 20 01/16/2012 0941   BUN 24* 10/18/2011 1415   CREATININE 1.4* 07/17/2012 1434   CREATININE 1.2 01/16/2012 0941   CREATININE 1.43* 10/18/2011 1415      Component Value Date/Time   CALCIUM 9.6 07/17/2012 1434   CALCIUM 9.1 01/16/2012 0941   CALCIUM 9.4 10/18/2011 1415   ALKPHOS 57 07/17/2012 1434   ALKPHOS 46 01/16/2012 0941   ALKPHOS 50 10/18/2011 1415   AST 16 07/17/2012 1434   AST 23 01/16/2012 0941   AST 17 10/18/2011 1415   ALT 13 07/17/2012 1434   ALT 12 10/18/2011 1415   BILITOT 0.48 07/17/2012 1434   BILITOT 0.60 01/16/2012 0941   BILITOT 0.4 10/18/2011 1415       RADIOGRAPHIC STUDIES: Ct Chest W Contrast  07/13/2012  *RADIOLOGY REPORT*  Clinical Data: Lung cancer.  Right upper lobectomy.  CT CHEST WITH CONTRAST  Technique:   Multidetector CT imaging of the chest was performed following the standard protocol during bolus administration of intravenous contrast.  Contrast: 80mL OMNIPAQUE IOHEXOL 300 MG/ML  SOLN  Comparison: 01/16/2012  Findings: Stable heterogeneous thyroid gland noted.  Aortic atherosclerosis is present.  AP window lymph node short axis 1.1 cm (formerly 0.7 cm).  No other pathologic thoracic adenopathy.  Moderate sized hiatal hernia noted.  Stable left adrenal fullness without adrenal mass.  Left anterior descending coronary artery atherosclerosis noted. Suspected left kidney upper pole scarring.  Crescentic band like density in the left upper lobe is reduced in thickness compared to prior.  Faint  associated ground-glass opacity noted.  Appearance favors radiation pneumonitis.  The previous nodule is inseparable from the band-like opacity but does not appear progressively increased.  Emphysema noted.  Slow growing ground-glass opacities are observed in the left upper lobe and adjacent left lower lobe on image 26 of series 5, with the left upper lobe opacity measuring about 2.0 x 1.0 cm (formerly 0.9 x 0.8 cm on 08/27/2004) and the adjacent lower lobe ground-glass opacity measuring 1.9 x 1.3 cm (formerly 1.0 x 0.6 cm on 08/27/2004).  Several other ground-glass nodules are present in the left upper lobe, and also in the right lower lobe, similar to recent priors.  IMPRESSION:  1.  Reduced thickness of curvilinear densities in the left upper lobe compatible with radiation therapy related findings.  The solid nodule previously seen in the left upper lobe is inseparable from the curvilinear density but does not appear progressive in the interim. 2.  Scattered ground-glass opacities in the lungs.  Several of these are slowly progressive over past 7 years, favoring low grade adenocarcinoma. 3.  Coronary artery atherosclerosis. 4.  Enlarged size of an AP window lymph node, now measuring 1.1 cm in short axis (previously 0.7 cm).   Malignant involvement of this node is not excluded.   Original Report Authenticated By: Gaylyn Rong, M.D.     ASSESSMENT: This is a very pleasant 72 years old white female with recurrent non-small cell lung cancer initially diagnosed as a stage IA but has multifocal groundglass opacities suspicious for low-grade adenocarcinoma bilaterally. The patient is status post right upper lobectomy as well as stereotactic radiotherapy to left upper lobe lung nodule on April 2013 and has been observation since that time. She is feeling fine today with no specific complaints except for mild fatigue and shortness of breath with exertion.  PLAN: The patient had several questions about her condition and treatment options. I explained to her that the Veristrat test was good indicating in general good prognosis in addition to good predictive value for treatment with Tarceva or chemotherapy. We had a lengthy discussion about the best timing to start treatment. The patient is currently asymptomatic and observation is very reasonable at this time. She agreed to this option. We also discussed sending some of her tumor tissue to [facility] for DNA sequencing and looking for any other driver mutation. I would see the patient back for followup visit in April 2014 as previously scheduled with repeat CT scan of the chest. She was advised to call me immediately if she has any concerning symptoms in the interval. I gave the patient the time to ask what her questions and I answered them completely to her satisfaction.  All questions were answered. The patient knows to call the clinic with any problems, questions or concerns. We can certainly see the patient much sooner if necessary.  I spent 25 minutes counseling the patient face to face. The total time spent in the appointment was 35 minutes.

## 2012-08-01 NOTE — Patient Instructions (Signed)
Veristrat test was good Continue observation for now. I would send her tissue block for mutation testing by [facility].

## 2012-08-05 DIAGNOSIS — C3490 Malignant neoplasm of unspecified part of unspecified bronchus or lung: Secondary | ICD-10-CM | POA: Diagnosis not present

## 2012-08-16 ENCOUNTER — Encounter: Payer: Self-pay | Admitting: *Deleted

## 2012-08-16 NOTE — Progress Notes (Signed)
Spoke with [facility] today.  Sample arrived Jan 27th.  Results should be ready after Feb 10th

## 2012-08-17 ENCOUNTER — Other Ambulatory Visit: Payer: Self-pay | Admitting: Internal Medicine

## 2012-08-20 ENCOUNTER — Encounter: Payer: Self-pay | Admitting: *Deleted

## 2012-08-20 NOTE — Progress Notes (Signed)
Called [facility] regarding pathology information.  They will call back with information

## 2012-08-27 ENCOUNTER — Other Ambulatory Visit: Payer: Self-pay | Admitting: Internal Medicine

## 2012-08-28 ENCOUNTER — Other Ambulatory Visit: Payer: Self-pay

## 2012-08-28 MED ORDER — DILTIAZEM HCL ER COATED BEADS 240 MG PO CP24: 240.0000 mg | ORAL_CAPSULE | Freq: Every day | ORAL | Status: DC

## 2012-09-12 ENCOUNTER — Other Ambulatory Visit: Payer: Self-pay

## 2012-09-12 ENCOUNTER — Encounter: Payer: Self-pay | Admitting: Internal Medicine

## 2012-09-12 MED ORDER — HYDROCHLOROTHIAZIDE 25 MG PO TABS: 25.0000 mg | ORAL_TABLET | Freq: Every morning | ORAL | Status: DC

## 2012-09-12 MED ORDER — DILTIAZEM HCL ER COATED BEADS 240 MG PO CP24: 240.0000 mg | ORAL_CAPSULE | Freq: Every day | ORAL | Status: DC

## 2012-09-12 MED ORDER — ATENOLOL 25 MG PO TABS: 25.0000 mg | ORAL_TABLET | Freq: Two times a day (BID) | ORAL | Status: DC

## 2012-09-18 ENCOUNTER — Encounter: Payer: Self-pay | Admitting: Internal Medicine

## 2012-09-18 ENCOUNTER — Ambulatory Visit (INDEPENDENT_AMBULATORY_CARE_PROVIDER_SITE_OTHER): Payer: Medicare Other | Admitting: Internal Medicine

## 2012-09-18 VITALS — BP 122/80 | HR 62 | Ht 66.0 in | Wt 156.8 lb

## 2012-09-18 DIAGNOSIS — R109 Unspecified abdominal pain: Secondary | ICD-10-CM

## 2012-09-18 DIAGNOSIS — R140 Abdominal distension (gaseous): Secondary | ICD-10-CM

## 2012-09-18 DIAGNOSIS — R143 Flatulence: Secondary | ICD-10-CM | POA: Diagnosis not present

## 2012-09-18 DIAGNOSIS — R142 Eructation: Secondary | ICD-10-CM

## 2012-09-18 DIAGNOSIS — Z85118 Personal history of other malignant neoplasm of bronchus and lung: Secondary | ICD-10-CM | POA: Diagnosis not present

## 2012-09-18 DIAGNOSIS — R141 Gas pain: Secondary | ICD-10-CM

## 2012-09-18 DIAGNOSIS — K219 Gastro-esophageal reflux disease without esophagitis: Secondary | ICD-10-CM

## 2012-09-18 MED ORDER — SIMETHICONE 80 MG PO CHEW: 80.0000 mg | CHEWABLE_TABLET | Freq: Three times a day (TID) | ORAL | Status: DC

## 2012-09-18 MED ORDER — ESOMEPRAZOLE MAGNESIUM 40 MG PO CPDR: 40.0000 mg | DELAYED_RELEASE_CAPSULE | Freq: Every day | ORAL | Status: DC

## 2012-09-18 NOTE — Patient Instructions (Addendum)
  You have been scheduled for a CT scan of the abdomen and pelvis at [facility].  You are scheduled on 10/16/2012 at 9:30am. You should arrive 15 minutes prior to your appointment time for registration. Please follow the written instructions below on the day of your exam:  WARNING: IF YOU ARE ALLERGIC TO IODINE/X-RAY DYE, PLEASE NOTIFY RADIOLOGY IMMEDIATELY AT [phone]! YOU WILL BE GIVEN A 13 HOUR PREMEDICATION PREP.  1) Do not eat or drink anything after 5:30am (4 hours prior to your test) 2) You have been given 2 bottles of oral contrast to drink. The solution may taste  better if refrigerated, but do NOT add ice or any other liquid to this solution. Shake well before drinking.    Drink 1 bottle of contrast @ 7:30am (2 hours prior to your exam)  Drink 1 bottle of contrast @ 8:30am (1 hour prior to your exam)  You may take any medications as prescribed with a small amount of water except for the following: Metformin, Glucophage, Glucovance, Avandamet, Riomet, Fortamet, Actoplus Met, Janumet, Glumetza or Metaglip. The above medications must be held the day of the exam AND 48 hours after the exam.  The purpose of you drinking the oral contrast is to aid in the visualization of your intestinal tract. The contrast solution may cause some diarrhea. Before your exam is started, you will be given a  small amount of fluid to drink. Depending on your individual set of symptoms, you may also receive an intravenous injection of x-ray contrast/dye. Plan on being at [facility] for 30 minutes or long, depending on the type of exam you are having performed.  If you have any questions regarding your exam or if you need to reschedule, you may call the CT department at [phone] between the hours of 8:00 am and 5:00 pm, Monday-Friday.  We have sent the following medications to your pharmacy for you to pick up at your convenience: Nexium, simethicone.  Please take all medications as  prescribed.   ________________________________________________________________________

## 2012-09-18 NOTE — Progress Notes (Signed)
Patient ID: [name], female   DOB: 1941-06-25, 72 y.o.   MRN: [id number]  SUBJECTIVE: HPI [name] is a 72 year old female with a past medical history of lung cancer status post right upper lobectomy now with recurrence of adenocarcinoma, hypertension, LBBB, melanoma status post resection who is seen in followup. She presents alone today. Was last seen in December 2012.  Since last being seen she has recently been told about her likely cancer recurrence and is considering initiation of Tarceva. She is currently asymptomatic from a pulmonary standpoint, though does have some dyspnea with exertion.  She underwent upper endoscopy on 04/28/2011 which revealed tortuous distal esophagus with the GE junction ring which was dilated to 15 mm with a TTS balloon. She also had a 5 cm hiatus hernia and moderate to severe erosive antral gastritis which was H. pylori negative on biopsy.  She has continued on Nexium which helped greatly with her heartburn and reflux. She denies any issues with heartburn while taking this medication. Overall her solid food dysphagia is improved from prior to dilation, but occasionally when taking a large bites she still has issues with mild dysphagia. No odynophagia. She has learned to eat slowly and take small bites which works for her. She has noticed increased belching associated with eating. She also is noted a mild upper abdominal pain underneath her ribs bilaterally. This does not seem to relate to eating. There is no nausea or vomiting. Bowel habits have been regular for her without blood in her stool or melena. No weight loss or early satiety.  Review of Systems  As per history of present illness, otherwise negative   Past Medical History  Diagnosis Date  . LBBB (left bundle branch block)   . Hypertension   . Hyperthyroidism     dr Everardo All- radioactive iodine treatment 2011- resolved  . Tremor     hand- intention tremor  . Cancer 1-10    lung- right non small cell,  adenocarcinoma w/ broncho- alveolar features  . Melanoma     left leg  . Complication of anesthesia     difficulty awakening, dysrhythmia post surgery    Current Outpatient Prescriptions  Medication Sig Dispense Refill  . ANGELIQ 0.5-1 MG per tablet TAKE ONE TABLET BY MOUTH ONE TIME DAILY  28 tablet  0  . Aspirin-Salicylamide-Caffeine (BC HEADACHE POWDER PO) Take 1 packet by mouth 3 (three) times daily.       Marland Kitchen atenolol (TENORMIN) 25 MG tablet Take 1 tablet (25 mg total) by mouth 2 (two) times daily.  180 tablet  1  . calcium-vitamin D (OSCAL WITH D) 500-200 MG-UNIT per tablet Take 1 tablet by mouth daily.       . celecoxib (CELEBREX) 200 MG capsule Take 200 mg by mouth daily as needed. For pain      . cholecalciferol (VITAMIN D) 1000 UNITS tablet Take 1,000 Units by mouth daily.       Marland Kitchen diltiazem (CARDIZEM CD) 240 MG 24 hr capsule Take 1 capsule (240 mg total) by mouth daily.  90 capsule  1  . esomeprazole (NEXIUM) 40 MG capsule Take 1 capsule (40 mg total) by mouth daily before breakfast.  90 capsule  3  . hydrochlorothiazide (HYDRODIURIL) 25 MG tablet Take 1 tablet (25 mg total) by mouth every morning.  90 tablet  1  . Multiple Vitamin (MULITIVITAMIN WITH MINERALS) TABS Take 1 tablet by mouth daily.      . famotidine (PEPCID) 40 MG tablet Take  40 mg by mouth at bedtime as needed. For upset stomach      . simethicone (MYLICON) 80 MG chewable tablet Chew 1 tablet (80 mg total) by mouth 3 (three) times daily.  90 tablet  3   No current facility-administered medications for this visit.    Allergies  Allergen Reactions  . Meperidine Hcl Anaphylaxis  . Morphine Other (See Comments)    Stopped breathing  . Penicillins Other (See Comments)    Difficulty breathing  . Oxycodone-Acetaminophen Itching       . Propoxyphene-Acetaminophen Nausea And Vomiting  . Tramadol Hcl Nausea And Vomiting    Family History  Problem Relation Age of Onset  . Asthma Mother   . Heart disease Mother    . Thyroid disease Daughter     hypothyroidism  . Cancer Maternal Uncle     lung  . Cancer Maternal Grandfather     lung  . Diabetes Neg Hx   . Coronary artery disease Neg Hx     History  Substance Use Topics  . Smoking status: Former Smoker -- 1.00 packs/day for 17 years    Types: Cigarettes    Quit date: 09/28/1975  . Smokeless tobacco: Never Used     Comment: 33 yrs ago  . Alcohol Use: Yes     Comment: glass wine daily    OBJECTIVE: BP 122/80  Pulse 62  Ht 5\' 6"  (1.676 m)  Wt 156 lb 12.8 oz (71.124 kg)  BMI 25.32 kg/m2  SpO2 92% Constitutional: Well-developed and well-nourished. No distress. HEENT: Normocephalic and atraumatic. Oropharynx is clear and moist. No oropharyngeal exudate. Conjunctivae are normal. No scleral icterus. Cardiovascular: Normal rate, regular rhythm and intact distal pulses. No M/R/G Pulmonary/chest: Clear bilaterally at the bases Abdominal: Soft, nontender, nondistended. Bowel sounds active throughout.  Extremities: no clubbing, cyanosis, or edema Neurological: Alert and oriented to person place and time. Skin: Skin is warm and dry. No rashes noted. Psychiatric: Normal mood and affect. Behavior is normal.  Labs and Imaging -- CBC    Component Value Date/Time   WBC 9.8 07/13/2012 0925   WBC 7.7 09/15/2011 0954   RBC 4.65 07/13/2012 0925   RBC 4.75 09/15/2011 0954   HGB 14.8 07/13/2012 0925   HGB 14.9 09/15/2011 0954   HCT 44.0 07/13/2012 0925   HCT 42.5 09/15/2011 0954   PLT 198 07/13/2012 0925   PLT 205 09/15/2011 0954   MCV 94.6 07/13/2012 0925   MCV 89.5 09/15/2011 0954   MCH 31.8 07/13/2012 0925   MCH 31.4 09/15/2011 0954   MCHC 33.6 07/13/2012 0925   MCHC 35.1 09/15/2011 0954   RDW 14.2 07/13/2012 0925   RDW 13.1 09/15/2011 0954   LYMPHSABS 1.7 07/13/2012 0925   LYMPHSABS 3.0 09/02/2009 1005   MONOABS 0.9 07/13/2012 0925   MONOABS 1.4* 09/02/2009 1005   EOSABS 0.4 07/13/2012 0925   EOSABS 0.3 09/02/2009 1005   BASOSABS 0.1 07/13/2012 0925   BASOSABS 0.0 09/02/2009  1005    CMP     Component Value Date/Time   NA 140 07/17/2012 1434   NA 137 01/16/2012 0941   NA 140 10/18/2011 1415   K 3.6 07/17/2012 1434   K 4.1 01/16/2012 0941   K 3.9 10/18/2011 1415   CL 105 07/17/2012 1434   CL 100 01/16/2012 0941   CL 103 10/18/2011 1415   CO2 26 07/17/2012 1434   CO2 28 01/16/2012 0941   CO2 25 10/18/2011 1415   GLUCOSE 133* 07/17/2012 1434  GLUCOSE 103 01/16/2012 0941   GLUCOSE 103* 10/18/2011 1415   BUN 21.0 07/17/2012 1434   BUN 20 01/16/2012 0941   BUN 24* 10/18/2011 1415   CREATININE 1.4* 07/17/2012 1434   CREATININE 1.2 01/16/2012 0941   CREATININE 1.43* 10/18/2011 1415   CALCIUM 9.6 07/17/2012 1434   CALCIUM 9.1 01/16/2012 0941   CALCIUM 9.4 10/18/2011 1415   PROT 7.1 07/17/2012 1434   PROT 6.4 01/16/2012 0941   PROT 6.3 10/18/2011 1415   ALBUMIN 3.9 07/17/2012 1434   ALBUMIN 4.3 10/18/2011 1415   AST 16 07/17/2012 1434   AST 23 01/16/2012 0941   AST 17 10/18/2011 1415   ALT 13 07/17/2012 1434   ALT 12 10/18/2011 1415   ALKPHOS 57 07/17/2012 1434   ALKPHOS 46 01/16/2012 0941   ALKPHOS 50 10/18/2011 1415   BILITOT 0.48 07/17/2012 1434   BILITOT 0.60 01/16/2012 0941   BILITOT 0.4 10/18/2011 1415   GFRNONAA 43* 09/02/2011 1444   GFRAA 50* 09/02/2011 1444    CT CHEST WITH CONTRAST -- 07/13/12   Technique:  Multidetector CT imaging of the chest was performed following the standard protocol during bolus administration of intravenous contrast.   Contrast: 80mL OMNIPAQUE IOHEXOL 300 MG/ML  SOLN   Comparison: 01/16/2012   Findings: Stable heterogeneous thyroid gland noted.  Aortic atherosclerosis is present.   AP window lymph node short axis 1.1 cm (formerly 0.7 cm).  No other pathologic thoracic adenopathy.  Moderate sized hiatal hernia noted.  Stable left adrenal fullness without adrenal mass.   Left anterior descending coronary artery atherosclerosis noted. Suspected left kidney upper pole scarring.   Crescentic band like density in the left upper lobe is reduced in thickness compared to prior.   Faint associated ground-glass opacity noted.  Appearance favors radiation pneumonitis.  The previous nodule is inseparable from the band-like opacity but does not appear progressively increased.   Emphysema noted.  Slow growing ground-glass opacities are observed in the left upper lobe and adjacent left lower lobe on image 26 of series 5, with the left upper lobe opacity measuring about 2.0 x 1.0 cm (formerly 0.9 x 0.8 cm on 08/27/2004) and the adjacent lower lobe ground-glass opacity measuring 1.9 x 1.3 cm (formerly 1.0 x 0.6 cm on 08/27/2004).  Several other ground-glass nodules are present in the left upper lobe, and also in the right lower lobe, similar to recent priors.   IMPRESSION:   1.  Reduced thickness of curvilinear densities in the left upper lobe compatible with radiation therapy related findings.  The solid nodule previously seen in the left upper lobe is inseparable from the curvilinear density but does not appear progressive in the interim. 2.  Scattered ground-glass opacities in the lungs.  Several of these are slowly progressive over past 7 years, favoring low grade adenocarcinoma. 3.  Coronary artery atherosclerosis. 4.  Enlarged size of an AP window lymph node, now measuring 1.1 cm in short axis (previously 0.7 cm).  Malignant involvement of this node is not excluded.     ASSESSMENT AND PLAN: 72 year old female with a past medical history of lung cancer status post right upper lobectomy now with recurrence of adenocarcinoma, hypertension, LBBB, melanoma status post resection who is seen in followup.  1.  GERD/hx of dysphagia -- her GERD seems to be well-controlled on Nexium and we will refill this prescription today. She will continue on 40 mg daily. Her dysphagia did respond to dilation of her distal esophageal stricture. She does have tortuosity in  the distal third of the esophagus and this likely contributes in some form or fashion to her intermittent symptoms.  I expect there is a component of esophageal dysmotility, perhaps related to nerve injury from prior chest surgery. Her now her dysphagia symptoms are mild and not impacting her a regular basis. At this point she does not desire repeat dilation  2.  Postprandial belching/upper abdominal pressure -- she has a history of gastritis felt to be NSAID induced. Her previous biopsies were H. pylori negative. I have recommended adding a CT abdomen and pelvis with contrast to her previously ordered CT chest for 10/16/2012. This will insure no upper abdominal pathology to explain her current symptoms. She will try simethicone with meals to see if this helps with belching. Also have asked that she notify me directly if her abdominal symptoms worsen prior to the imaging study. She voices understanding and is happy with this plan  3.  Recurrent lung cancer -- she is contemplating initiation of Tarceva with Dr. Arbutus Ped

## 2012-09-26 ENCOUNTER — Other Ambulatory Visit: Payer: Self-pay

## 2012-09-26 MED ORDER — DROSPIRENONE-ESTRADIOL 0.5-1 MG PO TABS: 1.0000 | ORAL_TABLET | Freq: Every day | ORAL | Status: DC

## 2012-10-15 ENCOUNTER — Other Ambulatory Visit (HOSPITAL_COMMUNITY): Payer: Medicare Other

## 2012-10-15 ENCOUNTER — Other Ambulatory Visit (HOSPITAL_BASED_OUTPATIENT_CLINIC_OR_DEPARTMENT_OTHER): Payer: Medicare Other | Admitting: Lab

## 2012-10-15 ENCOUNTER — Ambulatory Visit (HOSPITAL_COMMUNITY)
Admission: RE | Admit: 2012-10-15 | Discharge: 2012-10-15 | Disposition: A | Discharge status: Home / Self Care | Payer: Medicare Other | Source: Ambulatory Visit | Attending: Internal Medicine | Admitting: Internal Medicine

## 2012-10-15 DIAGNOSIS — K449 Diaphragmatic hernia without obstruction or gangrene: Secondary | ICD-10-CM | POA: Insufficient documentation

## 2012-10-15 DIAGNOSIS — C539 Malignant neoplasm of cervix uteri, unspecified: Secondary | ICD-10-CM | POA: Insufficient documentation

## 2012-10-15 DIAGNOSIS — C3410 Malignant neoplasm of upper lobe, unspecified bronchus or lung: Secondary | ICD-10-CM

## 2012-10-15 DIAGNOSIS — J701 Chronic and other pulmonary manifestations due to radiation: Secondary | ICD-10-CM | POA: Diagnosis not present

## 2012-10-15 DIAGNOSIS — R0602 Shortness of breath: Secondary | ICD-10-CM | POA: Diagnosis not present

## 2012-10-15 DIAGNOSIS — R918 Other nonspecific abnormal finding of lung field: Secondary | ICD-10-CM | POA: Insufficient documentation

## 2012-10-15 DIAGNOSIS — J984 Other disorders of lung: Secondary | ICD-10-CM | POA: Insufficient documentation

## 2012-10-15 DIAGNOSIS — Z902 Acquired absence of lung [part of]: Secondary | ICD-10-CM | POA: Diagnosis not present

## 2012-10-15 DIAGNOSIS — C3490 Malignant neoplasm of unspecified part of unspecified bronchus or lung: Secondary | ICD-10-CM | POA: Insufficient documentation

## 2012-10-15 DIAGNOSIS — J8410 Pulmonary fibrosis, unspecified: Secondary | ICD-10-CM | POA: Diagnosis not present

## 2012-10-15 DIAGNOSIS — I2510 Atherosclerotic heart disease of native coronary artery without angina pectoris: Secondary | ICD-10-CM | POA: Diagnosis not present

## 2012-10-15 DIAGNOSIS — Y842 Radiological procedure and radiotherapy as the cause of abnormal reaction of the patient, or of later complication, without mention of misadventure at the time of the procedure: Secondary | ICD-10-CM | POA: Insufficient documentation

## 2012-10-15 DIAGNOSIS — R599 Enlarged lymph nodes, unspecified: Secondary | ICD-10-CM | POA: Diagnosis not present

## 2012-10-15 LAB — CBC WITH DIFFERENTIAL/PLATELET
BASO%: 0.8 % (ref 0.0–2.0)
Basophils Absolute: 0.1 10*3/uL (ref 0.0–0.1)
EOS%: 12.5 % — ABNORMAL HIGH (ref 0.0–7.0)
MCH: 31.2 pg (ref 25.1–34.0)
MCHC: 33.3 g/dL (ref 31.5–36.0)
MCV: 93.8 fL (ref 79.5–101.0)
MONO%: 10.1 % (ref 0.0–14.0)
RBC: 4.55 10*6/uL (ref 3.70–5.45)
RDW: 12.9 % (ref 11.2–14.5)

## 2012-10-15 LAB — BUN AND CREATININE (CC13)
BUN: 23.8 mg/dL (ref 7.0–26.0)
Creatinine: 1.3 mg/dL — ABNORMAL HIGH (ref 0.6–1.1)

## 2012-10-15 MED ORDER — IOHEXOL 300 MG/ML  SOLN
80.0000 mL | Freq: Once | INTRAMUSCULAR | Status: AC | PRN
  Administered 2012-10-15: 80 mL via INTRAVENOUS

## 2012-10-16 ENCOUNTER — Other Ambulatory Visit (HOSPITAL_COMMUNITY): Payer: Medicare Other

## 2012-10-16 ENCOUNTER — Ambulatory Visit (HOSPITAL_COMMUNITY): Payer: Medicare Other

## 2012-10-17 ENCOUNTER — Telehealth: Payer: Self-pay | Admitting: Internal Medicine

## 2012-10-17 ENCOUNTER — Encounter: Payer: Self-pay | Admitting: Internal Medicine

## 2012-10-17 ENCOUNTER — Ambulatory Visit (INDEPENDENT_AMBULATORY_CARE_PROVIDER_SITE_OTHER): Payer: Medicare Other | Admitting: Internal Medicine

## 2012-10-17 ENCOUNTER — Ambulatory Visit (HOSPITAL_BASED_OUTPATIENT_CLINIC_OR_DEPARTMENT_OTHER): Payer: Medicare Other | Admitting: Internal Medicine

## 2012-10-17 VITALS — BP 142/78 | HR 64 | Temp 97.5°F | Resp 16 | Ht 65.0 in | Wt 158.0 lb

## 2012-10-17 DIAGNOSIS — R5381 Other malaise: Secondary | ICD-10-CM

## 2012-10-17 DIAGNOSIS — J309 Allergic rhinitis, unspecified: Secondary | ICD-10-CM | POA: Diagnosis not present

## 2012-10-17 DIAGNOSIS — R5383 Other fatigue: Secondary | ICD-10-CM | POA: Diagnosis not present

## 2012-10-17 DIAGNOSIS — C3410 Malignant neoplasm of upper lobe, unspecified bronchus or lung: Secondary | ICD-10-CM | POA: Diagnosis not present

## 2012-10-17 NOTE — Patient Instructions (Signed)
No evidence for disease progression on his recent scan. Followup in 6 months with repeat CT scan of the chest 

## 2012-10-17 NOTE — Progress Notes (Signed)
[facility] [phone]   Fax:([phone]) [phone]  OFFICE PROGRESS NOTE  [name], MD [address] [place] [address]  PRINCIPAL DIAGNOSES:  1. 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 The patient presented at that time with a right upper lobe lesion, as well as suspicious ground-glass opacities in the left lung. The tumor was negative for EGFR mutation and negative for ALK gene translocation. Veristrat test Good. 2. Stage IA malignant melanoma, status post wide excision by Dr. Terri Piedra on Nov 12, 2008.  PRIOR THERAPY:  1) Status post right upper lobectomy with lymph node dissection under the care of Dr. Edwyna Shell on May 21, 2008.  2) Stereotactic radiotherapy to the left upper lobe lung nodule under the care of Dr. Michell Heinrich expected to be completed on 10/21/2011.   CURRENT THERAPY: Observation.  INTERVAL HISTORY: [name] 72 y.o. female returns to the clinic today for followup visit accompanied by her husband. The patient has no complaints today except for generalized fatigue as well as seasonal allergy and chest congestion. She denied having any significant chest pain, shortness of breath, cough or hemoptysis. The patient has no significant weight loss or night sweats. She had repeat CT scan of the chest performed recently and she is here for evaluation and discussion of her scan results.   MEDICAL HISTORY: Past Medical History  Diagnosis Date  . LBBB (left bundle branch block)   . Hypertension   . Hyperthyroidism     dr Everardo All- radioactive iodine treatment 2011- resolved  . Tremor     hand- intention tremor  . Cancer 1-10    lung- right non small cell, adenocarcinoma w/ broncho- alveolar features  . Melanoma     left leg  . Complication of anesthesia     difficulty awakening, dysrhythmia post surgery    ALLERGIES:   is allergic to meperidine hcl; morphine; penicillins; oxycodone-acetaminophen; propoxyphene-acetaminophen; and tramadol hcl.  MEDICATIONS:  Current Outpatient Prescriptions  Medication Sig Dispense Refill  . Aspirin-Salicylamide-Caffeine (BC HEADACHE POWDER PO) Take 1 packet by mouth 3 (three) times daily.       Marland Kitchen atenolol (TENORMIN) 25 MG tablet Take 1 tablet (25 mg total) by mouth 2 (two) times daily.  180 tablet  1  . calcium-vitamin D (OSCAL WITH D) 500-200 MG-UNIT per tablet Take 1 tablet by mouth daily.       . celecoxib (CELEBREX) 200 MG capsule Take 200 mg by mouth daily as needed. For pain      . cholecalciferol (VITAMIN D) 1000 UNITS tablet Take 1,000 Units by mouth daily.       Marland Kitchen diltiazem (CARDIZEM CD) 240 MG 24 hr capsule Take 1 capsule (240 mg total) by mouth daily.  90 capsule  1  . drospirenone-estradiol (ANGELIQ) 0.5-1 MG per tablet Take 1 tablet by mouth daily.  28 tablet  0  . esomeprazole (NEXIUM) 40 MG capsule Take 1 capsule (40 mg total) by mouth daily before breakfast.  90 capsule  3  . famotidine (PEPCID) 40 MG tablet Take 40 mg by mouth at bedtime as needed. For upset stomach      . hydrochlorothiazide (HYDRODIURIL) 25 MG tablet Take 1 tablet (25 mg total) by mouth every morning.  90 tablet  1  . Multiple Vitamin (MULITIVITAMIN WITH MINERALS) TABS Take 1 tablet by mouth daily.      . simethicone (MYLICON)  80 MG chewable tablet Chew 1 tablet (80 mg total) by mouth 3 (three) times daily.  90 tablet  3   No current facility-administered medications for this visit.    SURGICAL HISTORY:  Past Surgical History  Procedure Laterality Date  . Tonsillectomy    . Conization for cervical dysplasia    . Rul lobectomy  2010    REVIEW OF SYSTEMS:  A comprehensive review of systems was negative except for: Constitutional: positive for fatigue   PHYSICAL EXAMINATION: General appearance: alert, cooperative and no distress Head: Normocephalic, without obvious abnormality,  atraumatic Neck: no adenopathy Resp: clear to auscultation bilaterally Cardio: regular rate and rhythm, S1, S2 normal, no murmur, click, rub or gallop GI: soft, non-tender; bowel sounds normal; no masses,  no organomegaly Extremities: extremities normal, atraumatic, no cyanosis or edema  ECOG PERFORMANCE STATUS: 1 - Symptomatic but completely ambulatory  There were no vitals taken for this visit.  LABORATORY DATA: Lab Results  Component Value Date   WBC 9.7 10/15/2012   HGB 14.2 10/15/2012   HCT 42.7 10/15/2012   MCV 93.8 10/15/2012   PLT 182 10/15/2012      Chemistry      Component Value Date/Time   NA 140 07/17/2012 1434   NA 137 01/16/2012 0941   NA 140 10/18/2011 1415   K 3.6 07/17/2012 1434   K 4.1 01/16/2012 0941   K 3.9 10/18/2011 1415   CL 105 07/17/2012 1434   CL 100 01/16/2012 0941   CL 103 10/18/2011 1415   CO2 26 07/17/2012 1434   CO2 28 01/16/2012 0941   CO2 25 10/18/2011 1415   BUN 23.8 10/15/2012 0909   BUN 20 01/16/2012 0941   BUN 24* 10/18/2011 1415   CREATININE 1.3* 10/15/2012 0909   CREATININE 1.2 01/16/2012 0941   CREATININE 1.43* 10/18/2011 1415      Component Value Date/Time   CALCIUM 9.6 07/17/2012 1434   CALCIUM 9.1 01/16/2012 0941   CALCIUM 9.4 10/18/2011 1415   ALKPHOS 57 07/17/2012 1434   ALKPHOS 46 01/16/2012 0941   ALKPHOS 50 10/18/2011 1415   AST 16 07/17/2012 1434   AST 23 01/16/2012 0941   AST 17 10/18/2011 1415   ALT 13 07/17/2012 1434   ALT 12 10/18/2011 1415   BILITOT 0.48 07/17/2012 1434   BILITOT 0.60 01/16/2012 0941   BILITOT 0.4 10/18/2011 1415       RADIOGRAPHIC STUDIES: Ct Chest W Contrast  10/15/2012  *RADIOLOGY REPORT*  Clinical Data: Lung cancer, cervical cancer.  Radiation therapy complete.  Shortness of breath.  CT CHEST WITH CONTRAST  Technique:  Multidetector CT imaging of the chest was performed following the standard protocol during bolus administration of intravenous contrast.  Contrast: 80mL OMNIPAQUE IOHEXOL 300 MG/ML  SOLN  Comparison: 07/13/2012, 07/21/2010 and 08/27/2004.   Findings: Mediastinal lymph nodes measure up to 11 mm in the AP window, as before.  No hilar or axillary adenopathy. Atherosclerotic calcification of the arterial vasculature, including coronary arteries.  Heart size normal.  No pericardial effusion.  Small hiatal hernia.  Sub centimeter short axis lymph node adjacent to the hiatal hernia is unchanged.  Minimal biapical pleural parenchymal scarring.  Postoperative changes of right upper lobectomy.  A combination of linear confluent opacity and ground-glass is seen in the left upper lobe, with associated volume loss and retraction toward the left suprahilar region.  Additional scattered ground-glass nodules are seen bilaterally, measuring up to 1.3 x 1.8 cm in the left lower lobe (image  27). While unchanged from 07/21/2010, there has been enlargement of some lesions from 08/27/2004, without development of solid components.  Mild subpleural fibrosis in the right lower lobe. No pleural fluid.  Airway is otherwise unremarkable.  Incidental imaging of the upper abdomen shows a 2.6 cm exophytic lesion off the upper pole right kidney, as on 07/21/2010. Scattered bilateral renal cortical scarring.  9 mm low attenuation lesion in the left hepatic lobe is unchanged.  No worrisome lytic or sclerotic lesions.  IMPRESSION:  Postoperative changes of right upper lobectomy and left upper lobe radiation fibrosis, with stable scattered ground-glass nodules and borderline enlarged mediastinal lymph nodes. While there has been enlargement of some nodules from baseline examination of 08/27/2004, there are no solid components.   Original Report Authenticated By: Leanna Battles, M.D.     ASSESSMENT: This is a very pleasant 72 years old white female with recurrent non-small cell lung cancer currently on observation with no evidence for disease progression on his recent scan.  PLAN: I discussed the scan results with the patient and her husband. I recommended for her to continue on  observation with repeat CT scan of the chest in 3 months She was advised to call me immediately if she has any concerning symptoms in the interval.  All questions were answered. The patient knows to call the clinic with any problems, questions or concerns. We can certainly see the patient much sooner if necessary.

## 2012-10-17 NOTE — Progress Notes (Signed)
Subjective:    Patient ID: [name], female    DOB: 13-Jan-1941, 72 y.o.   MRN: [id number]  HPI [name] is here for allergy symptoms: she has tried antihistamines, otc sprays, and she just bought nasocort. She has no fever or chills, no purulent drainage. She done have facial pressure and muffled hearing.  Past Medical History  Diagnosis Date  . LBBB (left bundle branch block)   . Hypertension   . Hyperthyroidism     dr Everardo All- radioactive iodine treatment 2011- resolved  . Tremor     hand- intention tremor  . Cancer 1-10    lung- right non small cell, adenocarcinoma w/ broncho- alveolar features  . Melanoma     left leg  . Complication of anesthesia     difficulty awakening, dysrhythmia post surgery   Past Surgical History  Procedure Laterality Date  . Tonsillectomy    . Conization for cervical dysplasia    . Rul lobectomy  2010   Family History  Problem Relation Age of Onset  . Asthma Mother   . Heart disease Mother   . Thyroid disease Daughter     hypothyroidism  . Cancer Maternal Uncle     lung  . Cancer Maternal Grandfather     lung  . Diabetes Neg Hx   . Coronary artery disease Neg Hx    History   Social History  . Marital Status: Married    Spouse Name: N/A    Number of Children: 2  . Years of Education: N/A   Occupational History  . homemaker    Social History Main Topics  . Smoking status: Former Smoker -- 1.00 packs/day for 17 years    Types: Cigarettes    Quit date: 09/28/1975  . Smokeless tobacco: Never Used     Comment: 33 yrs ago  . Alcohol Use: Yes     Comment: glass wine daily  . Drug Use: No  . Sexually Active: Not on file   Other Topics Concern  . Not on file   Social History Narrative   [employer] graduate   Married 1963   4 grandchildren and 2 step-grandchildren   Marriage in good health            Physician Roster:   Oncologist- Dr Arbutus Ped   Surgeon- Dr Jerene Canny- Dr Estrellita Ludwig- Dr Terri Piedra    Current  Outpatient Prescriptions on File Prior to Visit  Medication Sig Dispense Refill  . Aspirin-Salicylamide-Caffeine (BC HEADACHE POWDER PO) Take 1 packet by mouth 3 (three) times daily.       Marland Kitchen atenolol (TENORMIN) 25 MG tablet Take 1 tablet (25 mg total) by mouth 2 (two) times daily.  180 tablet  1  . calcium-vitamin D (OSCAL WITH D) 500-200 MG-UNIT per tablet Take 1 tablet by mouth daily.       . celecoxib (CELEBREX) 200 MG capsule Take 200 mg by mouth daily as needed. For pain      . cholecalciferol (VITAMIN D) 1000 UNITS tablet Take 1,000 Units by mouth daily.       Marland Kitchen diltiazem (CARDIZEM CD) 240 MG 24 hr capsule Take 1 capsule (240 mg total) by mouth daily.  90 capsule  1  . esomeprazole (NEXIUM) 40 MG capsule Take 1 capsule (40 mg total) by mouth daily before breakfast.  90 capsule  3  . famotidine (PEPCID) 40 MG tablet Take 40 mg by mouth at bedtime as needed. For upset stomach      .  hydrochlorothiazide (HYDRODIURIL) 25 MG tablet Take 1 tablet (25 mg total) by mouth every morning.  90 tablet  1  . Multiple Vitamin (MULITIVITAMIN WITH MINERALS) TABS Take 1 tablet by mouth daily.       No current facility-administered medications on file prior to visit.      Review of Systems System review is negative for any constitutional, cardiac, pulmonary, GI or neuro symptoms or complaints other than as described in the HPI.     Objective:   Physical Exam Filed Vitals:   10/17/12 1717  BP: 142/78  Pulse: 64  Temp: 97.5 F (36.4 C)  Resp: 16   Gen'l - WNWD white woman in no distress HEENT- TMs normal, no tenderness to percussion over the facial sinuses Pul - good breathsounds       Assessment & Plan:

## 2012-10-17 NOTE — Patient Instructions (Addendum)
Allergic rhinits  Plan Continue the nasacort AQ two sprays to each nostril once a day- gargle after use  For the next several days take claritin 10 mg once day  Sudafed 30 mg twice a day or maybe three times a day  Nasal saline wash  Stove top vaporizer as needed.  Allergic Rhinitis Allergic rhinitis is when the mucous membranes in the nose respond to allergens. Allergens are particles in the air that cause your body to have an allergic reaction. This causes you to release allergic antibodies. Through a chain of events, these eventually cause you to release histamine into the blood stream (hence the use of antihistamines). Although meant to be protective to the body, it is this release that causes your discomfort, such as frequent sneezing, congestion and an itchy runny nose.  CAUSES  The pollen allergens may come from grasses, trees, and weeds. This is seasonal allergic rhinitis, or "hay fever." Other allergens cause year-round allergic rhinitis (perennial allergic rhinitis) such as house dust mite allergen, pet dander and mold spores.  SYMPTOMS   Nasal stuffiness (congestion).  Runny, itchy nose with sneezing and tearing of the eyes.  There is often an itching of the mouth, eyes and ears. It cannot be cured, but it can be controlled with medications. DIAGNOSIS  If you are unable to determine the offending allergen, skin or blood testing may find it. TREATMENT   Avoid the allergen.  Medications and allergy shots (immunotherapy) can help.  Hay fever may often be treated with antihistamines in pill or nasal spray forms. Antihistamines block the effects of histamine. There are over-the-counter medicines that may help with nasal congestion and swelling around the eyes. Check with your caregiver before taking or giving this medicine. If the treatment above does not work, there are many new medications your caregiver can prescribe. Stronger medications may be used if initial measures are  ineffective. Desensitizing injections can be used if medications and avoidance fails. Desensitization is when a patient is given ongoing shots until the body becomes less sensitive to the allergen. Make sure you follow up with your caregiver if problems continue. SEEK MEDICAL CARE IF:   You develop fever (more than 100.5 F (38.1 C).  You develop a cough that does not stop easily (persistent).  You have shortness of breath.  You start wheezing.  Symptoms interfere with normal daily activities. Document Released: 03/22/2001 Document Revised: 09/19/2011 Document Reviewed: 10/01/2008 [facility] Patient Information 2013 [place], [place].

## 2012-10-17 NOTE — Telephone Encounter (Signed)
gv and printed appt schedule to pt for July...pt aware cs will contact with d/t ct

## 2012-10-19 ENCOUNTER — Other Ambulatory Visit: Payer: Self-pay

## 2012-10-19 MED ORDER — DROSPIRENONE-ESTRADIOL 0.5-1 MG PO TABS: 1.0000 | ORAL_TABLET | Freq: Every day | ORAL | Status: DC

## 2012-10-20 DIAGNOSIS — J309 Allergic rhinitis, unspecified: Secondary | ICD-10-CM | POA: Insufficient documentation

## 2012-10-20 NOTE — Assessment & Plan Note (Signed)
No evidence of infection.  PLan Use nasal steroid spray - the over the counter product is fine. Full explanation of mechanism of action provided.  For lack of good results - call back.

## 2012-11-23 ENCOUNTER — Other Ambulatory Visit: Payer: Self-pay | Admitting: Surgery

## 2012-11-23 DIAGNOSIS — L570 Actinic keratosis: Secondary | ICD-10-CM | POA: Diagnosis not present

## 2012-11-26 ENCOUNTER — Other Ambulatory Visit: Payer: Self-pay

## 2012-11-26 MED ORDER — DROSPIRENONE-ESTRADIOL 0.5-1 MG PO TABS: 1.0000 | ORAL_TABLET | Freq: Every day | ORAL | Status: DC

## 2012-11-27 DIAGNOSIS — H18419 Arcus senilis, unspecified eye: Secondary | ICD-10-CM | POA: Diagnosis not present

## 2012-11-27 DIAGNOSIS — H25049 Posterior subcapsular polar age-related cataract, unspecified eye: Secondary | ICD-10-CM | POA: Diagnosis not present

## 2012-11-27 DIAGNOSIS — H2510 Age-related nuclear cataract, unspecified eye: Secondary | ICD-10-CM | POA: Diagnosis not present

## 2012-11-27 DIAGNOSIS — H3530 Unspecified macular degeneration: Secondary | ICD-10-CM | POA: Diagnosis not present

## 2012-11-27 DIAGNOSIS — H25019 Cortical age-related cataract, unspecified eye: Secondary | ICD-10-CM | POA: Diagnosis not present

## 2012-12-18 ENCOUNTER — Other Ambulatory Visit: Payer: Self-pay | Admitting: Surgery

## 2012-12-18 DIAGNOSIS — Z85820 Personal history of malignant melanoma of skin: Secondary | ICD-10-CM | POA: Diagnosis not present

## 2012-12-18 DIAGNOSIS — D485 Neoplasm of uncertain behavior of skin: Secondary | ICD-10-CM | POA: Diagnosis not present

## 2012-12-18 DIAGNOSIS — L570 Actinic keratosis: Secondary | ICD-10-CM | POA: Diagnosis not present

## 2012-12-18 DIAGNOSIS — L905 Scar conditions and fibrosis of skin: Secondary | ICD-10-CM | POA: Diagnosis not present

## 2012-12-18 DIAGNOSIS — D235 Other benign neoplasm of skin of trunk: Secondary | ICD-10-CM | POA: Diagnosis not present

## 2013-01-14 DIAGNOSIS — H2510 Age-related nuclear cataract, unspecified eye: Secondary | ICD-10-CM | POA: Diagnosis not present

## 2013-01-14 DIAGNOSIS — H269 Unspecified cataract: Secondary | ICD-10-CM | POA: Diagnosis not present

## 2013-01-15 DIAGNOSIS — H2510 Age-related nuclear cataract, unspecified eye: Secondary | ICD-10-CM | POA: Diagnosis not present

## 2013-01-16 ENCOUNTER — Ambulatory Visit (HOSPITAL_COMMUNITY)
Admission: RE | Admit: 2013-01-16 | Discharge: 2013-01-16 | Disposition: A | Discharge status: Home / Self Care | Payer: Medicare Other | Source: Ambulatory Visit | Attending: Internal Medicine | Admitting: Internal Medicine

## 2013-01-16 ENCOUNTER — Other Ambulatory Visit (HOSPITAL_BASED_OUTPATIENT_CLINIC_OR_DEPARTMENT_OTHER): Payer: Medicare Other | Admitting: Lab

## 2013-01-16 ENCOUNTER — Encounter (HOSPITAL_COMMUNITY): Payer: Self-pay

## 2013-01-16 ENCOUNTER — Other Ambulatory Visit: Payer: Medicare Other | Admitting: Lab

## 2013-01-16 DIAGNOSIS — Z923 Personal history of irradiation: Secondary | ICD-10-CM | POA: Diagnosis not present

## 2013-01-16 DIAGNOSIS — C3410 Malignant neoplasm of upper lobe, unspecified bronchus or lung: Secondary | ICD-10-CM

## 2013-01-16 DIAGNOSIS — Z902 Acquired absence of lung [part of]: Secondary | ICD-10-CM | POA: Diagnosis not present

## 2013-01-16 DIAGNOSIS — I700 Atherosclerosis of aorta: Secondary | ICD-10-CM | POA: Insufficient documentation

## 2013-01-16 DIAGNOSIS — C3490 Malignant neoplasm of unspecified part of unspecified bronchus or lung: Secondary | ICD-10-CM | POA: Diagnosis not present

## 2013-01-16 DIAGNOSIS — I728 Aneurysm of other specified arteries: Secondary | ICD-10-CM | POA: Diagnosis not present

## 2013-01-16 DIAGNOSIS — M47814 Spondylosis without myelopathy or radiculopathy, thoracic region: Secondary | ICD-10-CM | POA: Insufficient documentation

## 2013-01-16 DIAGNOSIS — I2510 Atherosclerotic heart disease of native coronary artery without angina pectoris: Secondary | ICD-10-CM | POA: Diagnosis not present

## 2013-01-16 DIAGNOSIS — K449 Diaphragmatic hernia without obstruction or gangrene: Secondary | ICD-10-CM | POA: Diagnosis not present

## 2013-01-16 HISTORY — DX: Malignant neoplasm of cervix uteri, unspecified: C53.9

## 2013-01-16 LAB — CBC WITH DIFFERENTIAL/PLATELET
Eosinophils Absolute: 0.6 10*3/uL — ABNORMAL HIGH (ref 0.0–0.5)
HCT: 42.9 % (ref 34.8–46.6)
LYMPH%: 27.7 % (ref 14.0–49.7)
MCV: 93.3 fL (ref 79.5–101.0)
MONO#: 1 10*3/uL — ABNORMAL HIGH (ref 0.1–0.9)
MONO%: 12.7 % (ref 0.0–14.0)
NEUT#: 4 10*3/uL (ref 1.5–6.5)
NEUT%: 50.9 % (ref 38.4–76.8)
Platelets: 204 10*3/uL (ref 145–400)
RBC: 4.6 10*6/uL (ref 3.70–5.45)
WBC: 7.9 10*3/uL (ref 3.9–10.3)

## 2013-01-16 LAB — COMPREHENSIVE METABOLIC PANEL (CC13)
Alkaline Phosphatase: 47 U/L (ref 40–150)
BUN: 20.8 mg/dL (ref 7.0–26.0)
CO2: 26 mEq/L (ref 22–29)
Creatinine: 1.3 mg/dL — ABNORMAL HIGH (ref 0.6–1.1)
Glucose: 101 mg/dl (ref 70–140)
Total Bilirubin: 0.53 mg/dL (ref 0.20–1.20)

## 2013-01-16 MED ORDER — IOHEXOL 300 MG/ML  SOLN
80.0000 mL | Freq: Once | INTRAMUSCULAR | Status: AC | PRN
  Administered 2013-01-16: 80 mL via INTRAVENOUS

## 2013-01-18 ENCOUNTER — Other Ambulatory Visit: Payer: Self-pay | Admitting: Internal Medicine

## 2013-01-18 ENCOUNTER — Other Ambulatory Visit: Payer: Self-pay | Admitting: Cardiology

## 2013-01-21 ENCOUNTER — Encounter: Payer: Self-pay | Admitting: Internal Medicine

## 2013-01-21 ENCOUNTER — Telehealth: Payer: Self-pay | Admitting: Internal Medicine

## 2013-01-21 ENCOUNTER — Encounter: Payer: Self-pay | Admitting: *Deleted

## 2013-01-21 ENCOUNTER — Ambulatory Visit (HOSPITAL_BASED_OUTPATIENT_CLINIC_OR_DEPARTMENT_OTHER): Payer: Medicare Other | Admitting: Internal Medicine

## 2013-01-21 DIAGNOSIS — C3490 Malignant neoplasm of unspecified part of unspecified bronchus or lung: Secondary | ICD-10-CM | POA: Diagnosis not present

## 2013-01-21 NOTE — Telephone Encounter (Signed)
Gave  pt appt for lab, CT before   Md on  October 2014

## 2013-01-21 NOTE — Progress Notes (Signed)
[facility] [phone]   [phone]  OFFICE PROGRESS NOTE  [name], MD [address] [place] [address]  PRINCIPAL DIAGNOSES:  1. 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 The patient presented at that time with a right upper lobe lesion, as well as suspicious ground-glass opacities in the left lung. The tumor was negative for EGFR mutation and negative for ALK gene translocation. Veristrat test Good. EGFR mutation studies were conflicting, initial test was negative, second test was positive for EGFR mutation in exon 18 and the therapist from [facility] one was negative with positive KRAS mutation in Exon 12. 2. Stage IA malignant melanoma, status post wide excision by Dr. Terri Piedra on Nov 12, 2008.  PRIOR THERAPY:  1) Status post right upper lobectomy with lymph node dissection under the care of Dr. Edwyna Shell on May 21, 2008.  2) Stereotactic radiotherapy to the left upper lobe lung nodule under the care of Dr. Michell Heinrich expected to be completed on 10/21/2011.   CURRENT THERAPY: Observation.   INTERVAL HISTORY: [name] 72 y.o. female returns to the clinic today for follow up visit accompanied her husband. The patient is feeling fine today with no specific complaints. She denied having any significant chest pain, shortness breath, cough or hemoptysis. She denied having any significant weight loss or night sweats. The patient denied having any fever or chills and no nausea or vomiting. She had repeat CT scan of the chest performed recently and she is here for evaluation and discussion of her scan results.  MEDICAL HISTORY: Past Medical History  Diagnosis Date  . LBBB (left bundle branch block)   . Hypertension   . Hyperthyroidism     dr Everardo All- radioactive iodine treatment 2011- resolved  . Tremor     hand- intention  tremor  . Complication of anesthesia     difficulty awakening, dysrhythmia post surgery  . lung ca dx'd 07/2008    lung- right non small cell, adenocarcinoma w/ broncho- alveolar features  . Melanoma     left leg  . Cervical ca dx'd 1988    surg only    ALLERGIES:  is allergic to meperidine hcl; morphine; penicillins; oxycodone-acetaminophen; propoxyphene-acetaminophen; and tramadol hcl.  MEDICATIONS:  Current Outpatient Prescriptions  Medication Sig Dispense Refill  . ANGELIQ 0.5-1 MG per tablet TAKE 1 TABLET BY MOUTH DAILY  28 tablet  0  . Aspirin-Salicylamide-Caffeine (BC HEADACHE POWDER PO) Take 1 packet by mouth 3 (three) times daily.       Marland Kitchen atenolol (TENORMIN) 25 MG tablet TAKE ONE TABLET BY MOUTH TWICE DAILY  180 tablet  0  . calcium-vitamin D (OSCAL WITH D) 500-200 MG-UNIT per tablet Take 1 tablet by mouth daily.       . celecoxib (CELEBREX) 200 MG capsule Take 200 mg by mouth daily as needed. For pain      . cholecalciferol (VITAMIN D) 1000 UNITS tablet Take 1,000 Units by mouth daily.       Marland Kitchen diltiazem (CARDIZEM CD) 240 MG 24 hr capsule Take 1 capsule (240 mg total) by mouth daily.  90 capsule  1  . esomeprazole (NEXIUM) 40 MG capsule Take 1 capsule (40 mg total) by mouth daily before breakfast.  90 capsule  3  . famotidine (PEPCID) 40 MG tablet Take 40 mg by mouth at bedtime as needed. For upset stomach      .  hydrochlorothiazide (HYDRODIURIL) 25 MG tablet Take 1 tablet (25 mg total) by mouth every morning.  90 tablet  1  . Multiple Vitamin (MULITIVITAMIN WITH MINERALS) TABS Take 1 tablet by mouth daily.       No current facility-administered medications for this visit.    SURGICAL HISTORY:  Past Surgical History  Procedure Laterality Date  . Tonsillectomy    . Conization for cervical dysplasia    . Rul lobectomy  2010    REVIEW OF SYSTEMS:  A comprehensive review of systems was negative.   PHYSICAL EXAMINATION: General appearance: alert, cooperative and no  distress Head: Normocephalic, without obvious abnormality, atraumatic Neck: no adenopathy Lymph nodes: Cervical, supraclavicular, and axillary nodes normal. Resp: clear to auscultation bilaterally Cardio: regular rate and rhythm, S1, S2 normal, no murmur, click, rub or gallop GI: soft, non-tender; bowel sounds normal; no masses,  no organomegaly Extremities: extremities normal, atraumatic, no cyanosis or edema Neurologic: Alert and oriented X 3, normal strength and tone. Normal symmetric reflexes. Normal coordination and gait  ECOG PERFORMANCE STATUS: 0 - Asymptomatic  Blood pressure 141/71, pulse 62, temperature 97.2 F (36.2 C), temperature source Oral, resp. rate 18, height 5\' 5"  (1.651 m), weight 155 lb 9.6 oz (70.58 kg).  LABORATORY DATA: Lab Results  Component Value Date   WBC 7.9 01/16/2013   HGB 14.6 01/16/2013   HCT 42.9 01/16/2013   MCV 93.3 01/16/2013   PLT 204 01/16/2013      Chemistry      Component Value Date/Time   NA 140 01/16/2013 0955   NA 137 01/16/2012 0941   NA 140 10/18/2011 1415   K 3.8 01/16/2013 0955   K 4.1 01/16/2012 0941   K 3.9 10/18/2011 1415   CL 105 07/17/2012 1434   CL 100 01/16/2012 0941   CL 103 10/18/2011 1415   CO2 26 01/16/2013 0955   CO2 28 01/16/2012 0941   CO2 25 10/18/2011 1415   BUN 20.8 01/16/2013 0955   BUN 20 01/16/2012 0941   BUN 24* 10/18/2011 1415   CREATININE 1.3* 01/16/2013 0955   CREATININE 1.2 01/16/2012 0941   CREATININE 1.43* 10/18/2011 1415      Component Value Date/Time   CALCIUM 9.4 01/16/2013 0955   CALCIUM 9.1 01/16/2012 0941   CALCIUM 9.4 10/18/2011 1415   ALKPHOS 47 01/16/2013 0955   ALKPHOS 46 01/16/2012 0941   ALKPHOS 50 10/18/2011 1415   AST 15 01/16/2013 0955   AST 23 01/16/2012 0941   AST 17 10/18/2011 1415   ALT 15 01/16/2013 0955   ALT 12 10/18/2011 1415   BILITOT 0.53 01/16/2013 0955   BILITOT 0.60 01/16/2012 0941   BILITOT 0.4 10/18/2011 1415       RADIOGRAPHIC STUDIES: Ct Chest W Contrast  01/16/2013   *RADIOLOGY REPORT*  Clinical Data: Lung cancer.   Radiation therapy completed in 2013. Shortness of breath.Initially diagnosed with adenocarcinoma with bronchoalveolar features in right upper lobe.  CT CHEST WITH CONTRAST  Technique:  Multidetector CT imaging of the chest was performed following the standard protocol during bolus administration of intravenous contrast.  Contrast: 80mL OMNIPAQUE IOHEXOL 300 MG/ML  SOLN  Comparison: 10/15/2012 and 07/13/2012  Findings: Lungs/pleura: Surgical changes of right upper lobectomy. Similar configuration of radiation fibrosis within the left upper lobe.  No solid pulmonary nodules. Similar pattern of multiple ground-glass nodules.  Index nodule within the left upper lobe measures 1.6 x 1.3 cm on image 25/series 5 and compares with 1.4 x 1.5 on the prior.  Anterior left lower lobe ground-glass nodule measures 1.9 x 1.5 cm on image 26/series 5 versus 1.8 x 1.3 cm on the prior exam.  1.7 cm cranial caudal on coronal image 44 today versus 1.6 cm on the prior. This suggests mild interval enlargement.  No pleural fluid.  Heart/Mediastinum: No supraclavicular adenopathy.  Tiny bilateral thyroid nodules which are nonspecific.  Extensive atherosclerotic irregularity within the descending aorta. Normal heart size, without pericardial effusion.  Multivessel coronary artery atherosclerosis.  No central pulmonary embolism, on this non- dedicated study.  No middle mediastinal or hilar adenopathy.  A moderate hiatal hernia.  AP window node measures 9 mm on image 22 versus 1.1 cm on the prior exam. Para esophageal node measures 8 mm on image 49 versus 9 mm on the prior.  Upper abdomen: Mild left adrenal thickening, unchanged.  Small upper retroperitoneal nodes which are similar.  Incompletely imaged upper pole right renal lesion, likely a cyst.  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  Present back to 02/07/2011.  Bones/Musculoskeletal:  No acute osseous abnormality.  Advanced lower thoracic spondylosis.   IMPRESSION:  1.  Status post right upper lobectomy with similar radiation changes in the left upper lobe. 2.  Primarily similar scattered bilateral ground-glass nodules. A left lower lobe dominant lesion has likely enlarged minimally. Findings remain suspicious for multifocal low grade adenocarcinoma. 3.  Decrease size of prominent AP window and para esophageal nodes. Favor reactive. 4.  Moderate hiatal hernia. 5.  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   Original Report Authenticated By: Jeronimo Greaves, M.D.    ASSESSMENT AND PLAN: this is a very pleasant 72 years old white female with recurrent non-small cell lung cancer, adenocarcinoma with conflicting reports regarding the EGFR mutation including the recent one from the biopsy of the left upper lobe lung nodule that showed mutation at exon 18. Her CT scan showed no significant evidence for disease progression. I discussed the scan results with the patient and her husband. I had a lengthy discussion with him about her current condition and treatment options. I gave the patient the option of continuing observation and repeat scan at 3-6 months versus consideration of treatment with single agent Tarceva or Gilotrif. After detailed discussion of both options, the patient decided to continue on observation for now. She would come back for follow up visit in 3 months with repeat CT scan of the chest. The patient was advised to call immediately she has any concerning symptoms in the interval.  All questions were answered. The patient knows to call the clinic with any problems, questions or concerns. We can certainly see the patient much sooner if necessary.  I spent 15 minutes counseling the patient face to face. The total time spent in the appointment was 25 minutes.

## 2013-01-21 NOTE — Patient Instructions (Signed)
No significant evidence for disease progression on his recent scan.  Followup visit in 3 months with repeat CT scan of the chest.

## 2013-01-21 NOTE — Progress Notes (Signed)
[facility] regarding results.  They will fax information to [phone]

## 2013-01-28 DIAGNOSIS — H269 Unspecified cataract: Secondary | ICD-10-CM | POA: Diagnosis not present

## 2013-01-28 DIAGNOSIS — H2510 Age-related nuclear cataract, unspecified eye: Secondary | ICD-10-CM | POA: Diagnosis not present

## 2013-01-29 ENCOUNTER — Telehealth: Payer: Self-pay | Admitting: Medical Oncology

## 2013-01-29 NOTE — Telephone Encounter (Signed)
Wants CT changed back to  6 months instead of 3 . Call sent to scheduling.

## 2013-01-31 ENCOUNTER — Telehealth: Payer: Self-pay | Admitting: Internal Medicine

## 2013-01-31 ENCOUNTER — Other Ambulatory Visit: Payer: Self-pay | Admitting: Medical Oncology

## 2013-01-31 DIAGNOSIS — L820 Inflamed seborrheic keratosis: Secondary | ICD-10-CM | POA: Diagnosis not present

## 2013-01-31 NOTE — Telephone Encounter (Signed)
S/W THE PT AND SHE IS AWARE OF HER JAN 2015 APPTS THAT HAVE BEEN R/S FROM OCT PER PT'S REQUEST.

## 2013-02-20 ENCOUNTER — Encounter: Payer: Self-pay | Admitting: Internal Medicine

## 2013-03-21 DIAGNOSIS — H2510 Age-related nuclear cataract, unspecified eye: Secondary | ICD-10-CM | POA: Diagnosis not present

## 2013-03-22 ENCOUNTER — Other Ambulatory Visit: Payer: Self-pay | Admitting: Internal Medicine

## 2013-03-26 ENCOUNTER — Other Ambulatory Visit: Payer: Self-pay | Admitting: Internal Medicine

## 2013-03-28 DIAGNOSIS — Z1231 Encounter for screening mammogram for malignant neoplasm of breast: Secondary | ICD-10-CM | POA: Diagnosis not present

## 2013-03-29 ENCOUNTER — Other Ambulatory Visit: Payer: Self-pay | Admitting: Cardiology

## 2013-04-09 ENCOUNTER — Encounter: Payer: Self-pay | Admitting: Internal Medicine

## 2013-04-09 DIAGNOSIS — H18419 Arcus senilis, unspecified eye: Secondary | ICD-10-CM | POA: Diagnosis not present

## 2013-04-09 DIAGNOSIS — H3530 Unspecified macular degeneration: Secondary | ICD-10-CM | POA: Diagnosis not present

## 2013-04-09 DIAGNOSIS — Z961 Presence of intraocular lens: Secondary | ICD-10-CM | POA: Diagnosis not present

## 2013-04-09 DIAGNOSIS — H26499 Other secondary cataract, unspecified eye: Secondary | ICD-10-CM | POA: Diagnosis not present

## 2013-04-20 ENCOUNTER — Other Ambulatory Visit: Payer: Self-pay | Admitting: Internal Medicine

## 2013-04-20 ENCOUNTER — Other Ambulatory Visit: Payer: Self-pay | Admitting: Cardiology

## 2013-04-22 ENCOUNTER — Other Ambulatory Visit (HOSPITAL_COMMUNITY): Payer: Medicare Other

## 2013-04-22 ENCOUNTER — Other Ambulatory Visit: Payer: Medicare Other | Admitting: Lab

## 2013-04-23 ENCOUNTER — Ambulatory Visit: Payer: Medicare Other | Admitting: Internal Medicine

## 2013-04-23 ENCOUNTER — Other Ambulatory Visit: Payer: Medicare Other | Admitting: Lab

## 2013-05-01 ENCOUNTER — Other Ambulatory Visit: Payer: Medicare Other

## 2013-05-01 ENCOUNTER — Ambulatory Visit: Payer: Medicare Other | Admitting: Cardiovascular Disease

## 2013-05-07 DIAGNOSIS — Z961 Presence of intraocular lens: Secondary | ICD-10-CM | POA: Diagnosis not present

## 2013-05-07 DIAGNOSIS — H26499 Other secondary cataract, unspecified eye: Secondary | ICD-10-CM | POA: Diagnosis not present

## 2013-05-08 DIAGNOSIS — T1500XA Foreign body in cornea, unspecified eye, initial encounter: Secondary | ICD-10-CM | POA: Diagnosis not present

## 2013-05-09 ENCOUNTER — Other Ambulatory Visit: Payer: Self-pay

## 2013-05-09 MED ORDER — HYDROCHLOROTHIAZIDE 25 MG PO TABS: 25.0000 mg | ORAL_TABLET | Freq: Every morning | ORAL | Status: DC

## 2013-05-10 ENCOUNTER — Ambulatory Visit (INDEPENDENT_AMBULATORY_CARE_PROVIDER_SITE_OTHER): Payer: Medicare Other | Admitting: Cardiology

## 2013-05-10 ENCOUNTER — Encounter: Payer: Self-pay | Admitting: Cardiology

## 2013-05-10 VITALS — BP 132/72 | HR 68 | Wt 154.0 lb

## 2013-05-10 DIAGNOSIS — R0989 Other specified symptoms and signs involving the circulatory and respiratory systems: Secondary | ICD-10-CM

## 2013-05-10 DIAGNOSIS — H3530 Unspecified macular degeneration: Secondary | ICD-10-CM | POA: Insufficient documentation

## 2013-05-10 DIAGNOSIS — R251 Tremor, unspecified: Secondary | ICD-10-CM

## 2013-05-10 DIAGNOSIS — I447 Left bundle-branch block, unspecified: Secondary | ICD-10-CM

## 2013-05-10 DIAGNOSIS — I10 Essential (primary) hypertension: Secondary | ICD-10-CM | POA: Diagnosis not present

## 2013-05-10 DIAGNOSIS — R259 Unspecified abnormal involuntary movements: Secondary | ICD-10-CM | POA: Diagnosis not present

## 2013-05-10 DIAGNOSIS — I700 Atherosclerosis of aorta: Secondary | ICD-10-CM | POA: Insufficient documentation

## 2013-05-10 NOTE — Assessment & Plan Note (Signed)
This is a new diagnosis this year. She is following with her eye doctor's.

## 2013-05-10 NOTE — Assessment & Plan Note (Addendum)
This is a new diagnosis base on her chest CT scans. I've talked with her about starting a statin. She's hesitant at this point because of her reaction to medicines in the past. She does not have clinically significant coronary disease at this time. Fasting lipid profile will be done and I will talk with her further about any changes in her meds.   As part of today's evaluation I spent greater than 25 minutes with her total care. More than half of this time is been with direct contact talking the patient about all of her old and new or medical problems.

## 2013-05-10 NOTE — Assessment & Plan Note (Signed)
The patient's lung cancer is followed very carefully.

## 2013-05-10 NOTE — Assessment & Plan Note (Signed)
Her mild tremor persists. No change in therapy.

## 2013-05-10 NOTE — Assessment & Plan Note (Signed)
Blood pressure is controlled. No change in therapy. 

## 2013-05-10 NOTE — Assessment & Plan Note (Signed)
The patient has a soft right carotid bruit. It is now time to proceed with a carotid Doppler.

## 2013-05-10 NOTE — Patient Instructions (Signed)
Your physician has requested that you have a carotid duplex. This test is an ultrasound of the carotid arteries in your neck. It looks at blood flow through these arteries that supply the brain with blood. Allow one hour for this exam. There are no restrictions or special instructions.  Your physician recommends that you return for lab work in: on day of Carotid Duplex, please schedule early in the morning if posible. Do not eat or drink after midnight the night before labs are to be drawn  Your physician wants you to follow-up in: 1 year. You will receive a reminder letter in the mail two months in advance. If you don't receive a letter, please call our office to schedule the follow-up appointment.

## 2013-05-10 NOTE — Assessment & Plan Note (Signed)
Left bundle branch block is old. No change in therapy. 

## 2013-05-10 NOTE — Progress Notes (Signed)
HPI  The patient is seen today for cardiology followup. I saw her last September, 2013. I followed her for 25 years with history of left bundle branch block. Cardiac catheterization was done in the remote past and there was no significant coronary disease. The patient has lung cancer and has done extremely well for 5 or 6 years. She continues to be followed carefully by the oncology team. Her chest CT did show some atherosclerotic changes in her aorta. She's not having any symptoms of coronary disease.  Allergies  Allergen Reactions  . Meperidine Hcl Other (See Comments)    "knocked pt out for 4 days"  . Morphine Other (See Comments)    Stopped breathing  . Penicillins Other (See Comments)    Difficulty breathing  . Oxycodone-Acetaminophen Itching       . Propoxyphene-Acetaminophen Nausea And Vomiting  . Tramadol Hcl Nausea And Vomiting    Current Outpatient Prescriptions  Medication Sig Dispense Refill  . ANGELIQ 0.5-1 MG per tablet TAKE 1 TABLET BY MOUTH EVERY DAY  28 tablet  0  . Aspirin-Salicylamide-Caffeine (BC HEADACHE POWDER PO) Take 1 packet by mouth 3 (three) times daily.       Marland Kitchen atenolol (TENORMIN) 25 MG tablet TAKE 1 TABLET BY MOUTH TWICE DAILY  180 tablet  0  . calcium-vitamin D (OSCAL WITH D) 500-200 MG-UNIT per tablet Take 1 tablet by mouth daily.       . celecoxib (CELEBREX) 200 MG capsule Take 200 mg by mouth daily as needed. For pain      . diltiazem (DILACOR XR) 240 MG 24 hr capsule TAKE 1 CAPSULE BY MOUTH ONCE DAILY  30 capsule  2  . esomeprazole (NEXIUM) 40 MG capsule Take 1 capsule (40 mg total) by mouth daily before breakfast.  90 capsule  3  . hydrochlorothiazide (HYDRODIURIL) 25 MG tablet Take 1 tablet (25 mg total) by mouth every morning.  30 tablet  1  . Multiple Vitamin (MULITIVITAMIN WITH MINERALS) TABS Take 1 tablet by mouth daily.      . Multiple Vitamins-Minerals (PRESERVISION AREDS 2 PO) Take 2 tablets by mouth daily.      . famotidine (PEPCID) 40  MG tablet Take 40 mg by mouth at bedtime as needed. For upset stomach       No current facility-administered medications for this visit.    History   Social History  . Marital Status: Married    Spouse Name: N/A    Number of Children: 2  . Years of Education: N/A   Occupational History  . homemaker    Social History Main Topics  . Smoking status: Former Smoker -- 1.00 packs/day for 17 years    Types: Cigarettes    Quit date: 09/28/1975  . Smokeless tobacco: Never Used     Comment: 33 yrs ago  . Alcohol Use: Yes     Comment: glass wine daily  . Drug Use: No  . Sexual Activity: Not on file   Other Topics Concern  . Not on file   Social History Narrative   [employer] graduate   Married [phone]   4 grandchildren and 2 step-grandchildren   Marriage in good health            Physician Roster:   Oncologist- Dr Arbutus Ped   Surgeon- Dr Jerene Canny- Dr Estrellita Ludwig- Dr Terri Piedra    Family History  Problem Relation Age of Onset  . Asthma Mother   . Heart  disease Mother   . Thyroid disease Daughter     hypothyroidism  . Cancer Maternal Uncle     lung  . Cancer Maternal Grandfather     lung  . Diabetes Neg Hx   . Coronary artery disease Neg Hx     Past Medical History  Diagnosis Date  . LBBB (left bundle branch block)   . Hypertension   . Hyperthyroidism     dr Everardo All- radioactive iodine treatment 2011- resolved  . Tremor     hand- intention tremor  . Complication of anesthesia     difficulty awakening, dysrhythmia post surgery  . lung ca dx'd 07/2008    lung- right non small cell, adenocarcinoma w/ broncho- alveolar features  . Melanoma     left leg  . Cervical ca dx'd 1988    surg only    Past Surgical History  Procedure Laterality Date  . Tonsillectomy    . Conization for cervical dysplasia    . Rul lobectomy  2010    Patient Active Problem List   Diagnosis Date Noted  . Atherosclerosis of aorta 05/10/2013  . Allergic rhinitis 10/20/2012  . Lung  cancer 07/17/2012  . T1N0M1 AdenoCA of LUL, multifocal 09/28/2011  . Melanoma   . LBBB (left bundle branch block)   . Hypertension   . Pneumothorax of left lung after biopsy 09/15/2011  . Gastritis due to nonsteroidal anti-inflammatory drug 06/20/2011  . Cancer   . Hyperthyroidism   . Tremor   . Abdominal pain, RUQ 02/10/2011  . GERD with stricture 02/08/2011  . GOITER, MULTINODULAR 09/25/2009  . URI 09/02/2009  . ESSENTIAL HYPERTENSION, BENIGN 12/01/2008  . LEFT BUNDLE BRANCH BLOCK 12/01/2008    ROS   Patient denies fever, chills, headache, sweats, rash, change in vision, change in hearing, chest pain, cough, and nausea vomiting, urinary symptoms. All other systems are reviewed and are negative.  PHYSICAL EXAM  Patient is oriented to person time and place. Affect is normal. There is no jugulovenous distention. Lungs are clear. Respiratory effort is nonlabored. Cardiac exam reveals S1 and S2. There is a soft right carotid bruit. Abdomen is soft. There is no peripheral edema. There no musculoskeletal deformities. There are no skin rashes.  Filed Vitals:   05/10/13 0904  BP: 132/72  Pulse: 68  Weight: 154 lb (69.854 kg)   EKG is done today and reviewed by me. There is sinus rhythm. There is old left bundle branch block.  ASSESSMENT & PLAN

## 2013-05-16 ENCOUNTER — Other Ambulatory Visit: Payer: Self-pay

## 2013-05-17 ENCOUNTER — Encounter (HOSPITAL_COMMUNITY): Payer: Medicare Other

## 2013-05-17 ENCOUNTER — Other Ambulatory Visit (INDEPENDENT_AMBULATORY_CARE_PROVIDER_SITE_OTHER): Payer: Medicare Other

## 2013-05-17 ENCOUNTER — Ambulatory Visit (HOSPITAL_COMMUNITY): Payer: Medicare Other | Attending: Cardiology

## 2013-05-17 DIAGNOSIS — I700 Atherosclerosis of aorta: Secondary | ICD-10-CM

## 2013-05-17 DIAGNOSIS — I10 Essential (primary) hypertension: Secondary | ICD-10-CM | POA: Insufficient documentation

## 2013-05-17 DIAGNOSIS — I6529 Occlusion and stenosis of unspecified carotid artery: Secondary | ICD-10-CM

## 2013-05-17 DIAGNOSIS — R259 Unspecified abnormal involuntary movements: Secondary | ICD-10-CM

## 2013-05-17 DIAGNOSIS — Z87891 Personal history of nicotine dependence: Secondary | ICD-10-CM | POA: Diagnosis not present

## 2013-05-17 DIAGNOSIS — R251 Tremor, unspecified: Secondary | ICD-10-CM

## 2013-05-17 DIAGNOSIS — I658 Occlusion and stenosis of other precerebral arteries: Secondary | ICD-10-CM | POA: Diagnosis not present

## 2013-05-17 DIAGNOSIS — H3530 Unspecified macular degeneration: Secondary | ICD-10-CM

## 2013-05-17 DIAGNOSIS — R0989 Other specified symptoms and signs involving the circulatory and respiratory systems: Secondary | ICD-10-CM

## 2013-05-17 DIAGNOSIS — I447 Left bundle-branch block, unspecified: Secondary | ICD-10-CM

## 2013-05-17 LAB — LIPID PANEL
Cholesterol: 211 mg/dL — ABNORMAL HIGH (ref 0–200)
HDL: 59.1 mg/dL (ref >39.00)
Triglycerides: 99 mg/dL (ref 0.0–149.0)
VLDL: 19.8 mg/dL (ref 0.0–40.0)

## 2013-05-17 LAB — LDL CHOLESTEROL, DIRECT: Direct LDL: 137.6 mg/dL

## 2013-05-20 ENCOUNTER — Encounter: Payer: Self-pay | Admitting: Cardiology

## 2013-05-20 DIAGNOSIS — I739 Peripheral vascular disease, unspecified: Secondary | ICD-10-CM

## 2013-05-20 NOTE — Progress Notes (Signed)
I saw the patient in the office recently. She has ongoing diagnostic issues concerning her lung cancer. We decided to proceed with a carotid Doppler and to check her lipids because of atherosclerotic changes that were noted on her chest CT. Her carotid Doppler reveals only minimal disease. Her HDL is 59. LDL is 137.  I called to discuss this with her.  From the cardiology viewpoint I would like to start her on a statin. However she may need other medications for her lung cancer. She's been sensitive to meds with side effects in the past. It is not at all unreasonable to not treat with a statin at this time. This is her preference. For now we will not start any medications.  Jerral Bonito, MD

## 2013-05-23 ENCOUNTER — Other Ambulatory Visit: Payer: Self-pay | Admitting: Internal Medicine

## 2013-05-30 ENCOUNTER — Other Ambulatory Visit: Payer: Self-pay | Admitting: Cardiology

## 2013-07-02 ENCOUNTER — Other Ambulatory Visit: Payer: Self-pay | Admitting: Internal Medicine

## 2013-07-10 ENCOUNTER — Other Ambulatory Visit: Payer: Self-pay | Admitting: Cardiology

## 2013-07-22 ENCOUNTER — Other Ambulatory Visit: Payer: Self-pay | Admitting: Cardiology

## 2013-07-29 ENCOUNTER — Other Ambulatory Visit: Payer: Self-pay | Admitting: Internal Medicine

## 2013-07-29 NOTE — Telephone Encounter (Signed)
Any refills ?

## 2013-08-05 ENCOUNTER — Ambulatory Visit (HOSPITAL_COMMUNITY)
Admission: RE | Admit: 2013-08-05 | Discharge: 2013-08-05 | Disposition: A | Discharge status: Home / Self Care | Payer: Medicare Other | Source: Ambulatory Visit | Attending: Internal Medicine | Admitting: Internal Medicine

## 2013-08-05 ENCOUNTER — Encounter (HOSPITAL_COMMUNITY): Payer: Self-pay

## 2013-08-05 ENCOUNTER — Other Ambulatory Visit (HOSPITAL_BASED_OUTPATIENT_CLINIC_OR_DEPARTMENT_OTHER): Payer: Medicare Other

## 2013-08-05 DIAGNOSIS — N289 Disorder of kidney and ureter, unspecified: Secondary | ICD-10-CM | POA: Diagnosis not present

## 2013-08-05 DIAGNOSIS — R911 Solitary pulmonary nodule: Secondary | ICD-10-CM | POA: Diagnosis not present

## 2013-08-05 DIAGNOSIS — R918 Other nonspecific abnormal finding of lung field: Secondary | ICD-10-CM | POA: Insufficient documentation

## 2013-08-05 DIAGNOSIS — R599 Enlarged lymph nodes, unspecified: Secondary | ICD-10-CM | POA: Diagnosis not present

## 2013-08-05 DIAGNOSIS — M899 Disorder of bone, unspecified: Secondary | ICD-10-CM | POA: Diagnosis not present

## 2013-08-05 DIAGNOSIS — I728 Aneurysm of other specified arteries: Secondary | ICD-10-CM | POA: Insufficient documentation

## 2013-08-05 DIAGNOSIS — I2510 Atherosclerotic heart disease of native coronary artery without angina pectoris: Secondary | ICD-10-CM | POA: Diagnosis not present

## 2013-08-05 DIAGNOSIS — Z902 Acquired absence of lung [part of]: Secondary | ICD-10-CM | POA: Insufficient documentation

## 2013-08-05 DIAGNOSIS — I517 Cardiomegaly: Secondary | ICD-10-CM | POA: Diagnosis not present

## 2013-08-05 DIAGNOSIS — K449 Diaphragmatic hernia without obstruction or gangrene: Secondary | ICD-10-CM | POA: Diagnosis not present

## 2013-08-05 DIAGNOSIS — C3490 Malignant neoplasm of unspecified part of unspecified bronchus or lung: Secondary | ICD-10-CM | POA: Diagnosis not present

## 2013-08-05 DIAGNOSIS — Z923 Personal history of irradiation: Secondary | ICD-10-CM | POA: Diagnosis not present

## 2013-08-05 DIAGNOSIS — M949 Disorder of cartilage, unspecified: Secondary | ICD-10-CM

## 2013-08-05 DIAGNOSIS — I700 Atherosclerosis of aorta: Secondary | ICD-10-CM | POA: Diagnosis not present

## 2013-08-05 DIAGNOSIS — E278 Other specified disorders of adrenal gland: Secondary | ICD-10-CM | POA: Diagnosis not present

## 2013-08-05 LAB — COMPREHENSIVE METABOLIC PANEL (CC13)
ALBUMIN: 3.6 g/dL (ref 3.5–5.0)
ALK PHOS: 55 U/L (ref 40–150)
ALT: 13 U/L (ref 0–55)
AST: 14 U/L (ref 5–34)
Anion Gap: 11 mEq/L (ref 3–11)
BUN: 26.4 mg/dL — AB (ref 7.0–26.0)
CALCIUM: 9.6 mg/dL (ref 8.4–10.4)
CO2: 27 mEq/L (ref 22–29)
CREATININE: 1.4 mg/dL — AB (ref 0.6–1.1)
Chloride: 104 mEq/L (ref 98–109)
GLUCOSE: 106 mg/dL (ref 70–140)
POTASSIUM: 3.8 meq/L (ref 3.5–5.1)
Sodium: 142 mEq/L (ref 136–145)
Total Bilirubin: 0.44 mg/dL (ref 0.20–1.20)
Total Protein: 6.2 g/dL — ABNORMAL LOW (ref 6.4–8.3)

## 2013-08-05 MED ORDER — IOHEXOL 300 MG/ML  SOLN
80.0000 mL | Freq: Once | INTRAMUSCULAR | Status: AC | PRN
  Administered 2013-08-05: 80 mL via INTRAVENOUS

## 2013-08-07 ENCOUNTER — Telehealth: Payer: Self-pay | Admitting: Internal Medicine

## 2013-08-07 ENCOUNTER — Ambulatory Visit (HOSPITAL_BASED_OUTPATIENT_CLINIC_OR_DEPARTMENT_OTHER): Payer: Medicare Other | Admitting: Internal Medicine

## 2013-08-07 ENCOUNTER — Encounter: Payer: Self-pay | Admitting: Internal Medicine

## 2013-08-07 VITALS — BP 164/74 | HR 71 | Temp 97.5°F | Resp 18 | Ht 65.0 in | Wt 158.0 lb

## 2013-08-07 DIAGNOSIS — C439 Malignant melanoma of skin, unspecified: Secondary | ICD-10-CM | POA: Diagnosis not present

## 2013-08-07 DIAGNOSIS — C3410 Malignant neoplasm of upper lobe, unspecified bronchus or lung: Secondary | ICD-10-CM | POA: Diagnosis not present

## 2013-08-07 DIAGNOSIS — C3490 Malignant neoplasm of unspecified part of unspecified bronchus or lung: Secondary | ICD-10-CM

## 2013-08-07 NOTE — Progress Notes (Signed)
[place] [phone]   [phone]  OFFICE PROGRESS NOTE  [name], MD [address] [place] [address]  PRINCIPAL DIAGNOSES:  1. 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 The patient presented at that time with a right upper lobe lesion, as well as suspicious ground-glass opacities in the left lung. The tumor was negative for EGFR mutation and negative for ALK gene translocation. Veristrat test Good. EGFR mutation studies were conflicting, initial test was negative, second test was positive for EGFR mutation in exon 18 and the the test from [facility] one was negative with positive KRAS mutation in Exon 12. 2. Stage IA malignant melanoma, status post wide excision by Dr. Allyson Sabal on Nov 12, 2008.  PRIOR THERAPY:  1) Status post right upper lobectomy with lymph node dissection under the care of Dr. Arlyce Dice on May 21, 2008.  2) Stereotactic radiotherapy to the left upper lobe lung nodule under the care of Dr. Pablo Ledger expected to be completed on 10/21/2011.   CURRENT THERAPY: Observation.   INTERVAL HISTORY: [name] 73 y.o. female returns to the clinic today for follow up visit accompanied by her husband. The patient is feeling fine today with no specific complaints except for back pain secondary to scoliosis. She denied having any significant chest pain, shortness of breath, cough or hemoptysis. She denied having any significant weight loss or night sweats. The patient denied having any fever or chills and no nausea or vomiting. She had repeat CT scan of the chest performed recently and she is here for evaluation and discussion of her scan results.  MEDICAL HISTORY: Past Medical History  Diagnosis Date  . LBBB (left bundle branch block)   . Hypertension   . Hyperthyroidism     dr Loanne Drilling- radioactive iodine  treatment 2011- resolved  . Tremor     hand- intention tremor  . Complication of anesthesia     difficulty awakening, dysrhythmia post surgery  . lung ca dx'd 07/2008    lung- right non small cell, adenocarcinoma w/ broncho- alveolar features  . Melanoma     left leg  . Cervical ca dx'd 1988    surg only    ALLERGIES:  is allergic to meperidine hcl; morphine; penicillins; oxycodone-acetaminophen; propoxyphene n-acetaminophen; and tramadol hcl.  MEDICATIONS:  Current Outpatient Prescriptions  Medication Sig Dispense Refill  . ANGELIQ 0.5-1 MG per tablet TAKE 1 TABLET BY MOUTH EVERY DAY  28 tablet  0  . ANGELIQ 0.5-1 MG per tablet TAKE 1 TABLET BY MOUTH EVERY DAY  28 tablet  0  . Aspirin-Salicylamide-Caffeine (BC HEADACHE POWDER PO) Take 1 packet by mouth 3 (three) times daily.       Marland Kitchen atenolol (TENORMIN) 25 MG tablet TAKE 1 TABLET BY MOUTH TWICE DAILY  180 tablet  0  . calcium-vitamin D (OSCAL WITH D) 500-200 MG-UNIT per tablet Take 1 tablet by mouth daily.       . celecoxib (CELEBREX) 200 MG capsule Take 200 mg by mouth daily as needed. For pain      . diltiazem (DILACOR XR) 240 MG 24 hr capsule TAKE ONE CAPSULE BY MOUTH EVERY DAY  30 capsule  10  . esomeprazole (NEXIUM) 40 MG capsule Take 1 capsule (40 mg total) by mouth daily before breakfast.  90 capsule  3  . famotidine (PEPCID) 40 MG tablet Take 40 mg by mouth  at bedtime as needed. For upset stomach      . hydrochlorothiazide (HYDRODIURIL) 25 MG tablet TAKE 1 TABLET BY MOUTH EVERY MORNING  30 tablet  0  . Multiple Vitamin (MULITIVITAMIN WITH MINERALS) TABS Take 1 tablet by mouth daily.      . Multiple Vitamins-Minerals (PRESERVISION AREDS 2 PO) Take 2 tablets by mouth daily.       No current facility-administered medications for this visit.    SURGICAL HISTORY:  Past Surgical History  Procedure Laterality Date  . Tonsillectomy    . Conization for cervical dysplasia    . Rul lobectomy  2010    REVIEW OF SYSTEMS:  A  comprehensive review of systems was negative except for: Musculoskeletal: positive for back pain   PHYSICAL EXAMINATION: General appearance: alert, cooperative and no distress Head: Normocephalic, without obvious abnormality, atraumatic Neck: no adenopathy Lymph nodes: Cervical, supraclavicular, and axillary nodes normal. Resp: clear to auscultation bilaterally Cardio: regular rate and rhythm, S1, S2 normal, no murmur, click, rub or gallop GI: soft, non-tender; bowel sounds normal; no masses,  no organomegaly Extremities: extremities normal, atraumatic, no cyanosis or edema Neurologic: Alert and oriented X 3, normal strength and tone. Normal symmetric reflexes. Normal coordination and gait  ECOG PERFORMANCE STATUS: 0 - Asymptomatic  There were no vitals taken for this visit.  LABORATORY DATA: Lab Results  Component Value Date   WBC 7.9 01/16/2013   HGB 14.6 01/16/2013   HCT 42.9 01/16/2013   MCV 93.3 01/16/2013   PLT 204 01/16/2013      Chemistry      Component Value Date/Time   NA 142 08/05/2013 0944   NA 137 01/16/2012 0941   NA 140 10/18/2011 1415   K 3.8 08/05/2013 0944   K 4.1 01/16/2012 0941   K 3.9 10/18/2011 1415   CL 105 07/17/2012 1434   CL 100 01/16/2012 0941   CL 103 10/18/2011 1415   CO2 27 08/05/2013 0944   CO2 28 01/16/2012 0941   CO2 25 10/18/2011 1415   BUN 26.4* 08/05/2013 0944   BUN 20 01/16/2012 0941   BUN 24* 10/18/2011 1415   CREATININE 1.4* 08/05/2013 0944   CREATININE 1.2 01/16/2012 0941   CREATININE 1.43* 10/18/2011 1415      Component Value Date/Time   CALCIUM 9.6 08/05/2013 0944   CALCIUM 9.1 01/16/2012 0941   CALCIUM 9.4 10/18/2011 1415   ALKPHOS 55 08/05/2013 0944   ALKPHOS 46 01/16/2012 0941   ALKPHOS 50 10/18/2011 1415   AST 14 08/05/2013 0944   AST 23 01/16/2012 0941   AST 17 10/18/2011 1415   ALT 13 08/05/2013 0944   ALT 21 01/16/2012 0941   ALT 12 10/18/2011 1415   BILITOT 0.44 08/05/2013 0944   BILITOT 0.60 01/16/2012 0941   BILITOT 0.4 10/18/2011 1415       RADIOGRAPHIC  STUDIES:  Ct Chest W Contrast  08/05/2013   CLINICAL DATA:  Lung cancer diagnosed 1/10. Radiation therapy complete in 2013. Right upper lobectomy.  EXAM: CT CHEST WITH CONTRAST  TECHNIQUE: Multidetector CT imaging of the chest was performed during intravenous contrast administration.  CONTRAST:  34m OMNIPAQUE IOHEXOL 300 MG/ML  SOLN  COMPARISON:  CT CHEST W/CM dated 01/16/2013; CT CHEST W/CM dated 10/15/2012  FINDINGS: Lungs/Pleura: Surgical changes of right upper lobectomy. Centrilobular emphysema.  Radiation changes in the left upper lobe, similar.  Scarring in the left lower lobe.  A left upper lobe soft tissue nodule measures 6 mm on image 13/series 5  and is new.  Medial left lower lobe 5 mm nodule on image 46 is similar to slightly more conspicuous today.  Multi focal ground-glass nodules are again identified. Example left upper lobe at 1.8 x 1.2 cm today versus 1.6 x 1.3 cm on the prior. Felt to be similar.  Left lower lobe 1.9 x 1.4 cm on image 23 versus similar on the prior. Scattered smaller ground-glass right-sided nodules are similar.  No pleural fluid.  Heart/Mediastinum: Aortic and branch vessel atherosclerosis. Mild cardiomegaly with coronary artery atherosclerosis. No central pulmonary embolism, on this non-dedicated study. Enlargement of a prevascular node, which measures 1.2 x 1.8 cm on image 20 versus 1.5 x 0.9 cm on the prior. Small to moderate hiatal hernia. No hilar adenopathy.  Upper Abdomen: Upper pole right renal lesion which is likely a cyst or minimally complex cyst. Similar in size to on the prior exam, but incompletely imaged at 2.5 cm. Too small to characterize upper pole left renal lesion. Mild left adrenal thickening which is unchanged.  Similar common hepatic artery aneurysm ; 1.0 cm. Celiac axis narrowing with mild poststenotic dilatation. Sagittal image 54. Similar.  Bones/Musculoskeletal:  Moderate osteopenia.  Marland Kitchen  IMPRESSION: 1. Developing prevascular adenopathy, most consistent  with disease progression. 2. Developing soft tissue nodule in the left upper lobe. Equivocally progressive left lower lobe lung nodule. Findings are suspicious for pulmonary metastasis. 3. Other ground-glass nodules which are felt to be similar and are again suspicious for multifocal low-grade adenocarcinoma. 4. Status post right upper lobectomy with radiation changes in the left upper lobe. 5. 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   Electronically Signed   By: Abigail Miyamoto M.D.   On: 08/05/2013 14:21     ASSESSMENT AND PLAN: this is a very pleasant 73 years old white female with recurrent non-small cell lung cancer, adenocarcinoma with conflicting reports regarding the EGFR mutation including the recent one from the biopsy of the left upper lobe lung nodule that showed mutation at exon 18. Her CT scan showed no significant changes except for enlarging prevascular lymphadenopathy suspicious for disease progression. I discussed the scan results with the patient and her husband and showed them the images. I recommended for the patient to have a PET scan performed for further evaluation of the enlarging mediastinal lymphadenopathy. I would see her back for followup visit in 3 weeks for evaluation and discussion of her treatment options based on the PET scan results. The patient was advised to call immediately if she has any concerning symptoms in the interval.  All questions were answered. The patient knows to call the clinic with any problems, questions or concerns. We can certainly see the patient much sooner if necessary.  Disclaimer: This note was dictated with voice recognition software. Similar sounding words can inadvertently be transcribed and may not be corrected upon review.

## 2013-08-07 NOTE — Telephone Encounter (Signed)
gave pt appt for MD only on february 2015

## 2013-08-07 NOTE — Patient Instructions (Signed)
Followup visit in 3 weeks after repeating a PET scan.

## 2013-08-09 ENCOUNTER — Other Ambulatory Visit: Payer: Self-pay | Admitting: Cardiology

## 2013-08-16 ENCOUNTER — Encounter (HOSPITAL_COMMUNITY): Payer: Self-pay | Admitting: Emergency Medicine

## 2013-08-16 ENCOUNTER — Emergency Department (HOSPITAL_COMMUNITY): Payer: Medicare Other

## 2013-08-16 ENCOUNTER — Emergency Department (HOSPITAL_COMMUNITY)
Admission: EM | Admit: 2013-08-16 | Discharge: 2013-08-16 | Disposition: A | Discharge status: Home / Self Care | Payer: Medicare Other | Attending: Emergency Medicine | Admitting: Emergency Medicine

## 2013-08-16 DIAGNOSIS — Z87891 Personal history of nicotine dependence: Secondary | ICD-10-CM | POA: Diagnosis not present

## 2013-08-16 DIAGNOSIS — Z8544 Personal history of malignant neoplasm of other female genital organs: Secondary | ICD-10-CM | POA: Insufficient documentation

## 2013-08-16 DIAGNOSIS — I10 Essential (primary) hypertension: Secondary | ICD-10-CM | POA: Diagnosis not present

## 2013-08-16 DIAGNOSIS — Z880 Allergy status to penicillin: Secondary | ICD-10-CM | POA: Insufficient documentation

## 2013-08-16 DIAGNOSIS — I447 Left bundle-branch block, unspecified: Secondary | ICD-10-CM | POA: Diagnosis not present

## 2013-08-16 DIAGNOSIS — Z79899 Other long term (current) drug therapy: Secondary | ICD-10-CM | POA: Diagnosis not present

## 2013-08-16 DIAGNOSIS — Z85118 Personal history of other malignant neoplasm of bronchus and lung: Secondary | ICD-10-CM | POA: Diagnosis not present

## 2013-08-16 DIAGNOSIS — K219 Gastro-esophageal reflux disease without esophagitis: Secondary | ICD-10-CM | POA: Insufficient documentation

## 2013-08-16 DIAGNOSIS — K222 Esophageal obstruction: Secondary | ICD-10-CM | POA: Diagnosis not present

## 2013-08-16 DIAGNOSIS — M47812 Spondylosis without myelopathy or radiculopathy, cervical region: Secondary | ICD-10-CM | POA: Diagnosis not present

## 2013-08-16 DIAGNOSIS — J449 Chronic obstructive pulmonary disease, unspecified: Secondary | ICD-10-CM | POA: Diagnosis not present

## 2013-08-16 DIAGNOSIS — Z85820 Personal history of malignant melanoma of skin: Secondary | ICD-10-CM | POA: Diagnosis not present

## 2013-08-16 DIAGNOSIS — Z862 Personal history of diseases of the blood and blood-forming organs and certain disorders involving the immune mechanism: Secondary | ICD-10-CM | POA: Diagnosis not present

## 2013-08-16 DIAGNOSIS — J4489 Other specified chronic obstructive pulmonary disease: Secondary | ICD-10-CM | POA: Diagnosis not present

## 2013-08-16 DIAGNOSIS — Z8639 Personal history of other endocrine, nutritional and metabolic disease: Secondary | ICD-10-CM | POA: Diagnosis not present

## 2013-08-16 LAB — BASIC METABOLIC PANEL
BUN: 23 mg/dL (ref 6–23)
CHLORIDE: 99 meq/L (ref 96–112)
CO2: 24 meq/L (ref 19–32)
CREATININE: 1.27 mg/dL — AB (ref 0.50–1.10)
Calcium: 9.4 mg/dL (ref 8.4–10.5)
GFR calc Af Amer: 48 mL/min — ABNORMAL LOW (ref >90)
GFR calc non Af Amer: 41 mL/min — ABNORMAL LOW (ref >90)
GLUCOSE: 157 mg/dL — AB (ref 70–99)
Potassium: 3.4 mEq/L — ABNORMAL LOW (ref 3.7–5.3)
Sodium: 140 mEq/L (ref 137–147)

## 2013-08-16 LAB — CBC
HCT: 42.8 % (ref 36.0–46.0)
HEMOGLOBIN: 14.6 g/dL (ref 12.0–15.0)
MCH: 31.9 pg (ref 26.0–34.0)
MCHC: 34.1 g/dL (ref 30.0–36.0)
MCV: 93.7 fL (ref 78.0–100.0)
Platelets: 206 10*3/uL (ref 150–400)
RBC: 4.57 MIL/uL (ref 3.87–5.11)
RDW: 13.3 % (ref 11.5–15.5)
WBC: 12.9 10*3/uL — AB (ref 4.0–10.5)

## 2013-08-16 NOTE — Discharge Instructions (Signed)
Diet for Gastroesophageal Reflux Disease, Adult Reflux (acid reflux) is when acid from your stomach flows up into the esophagus. When acid comes in contact with the esophagus, the acid causes irritation and soreness (inflammation) in the esophagus. When reflux happens often or so severely that it causes damage to the esophagus, it is called gastroesophageal reflux disease (GERD). Nutrition therapy can help ease the discomfort of GERD. FOODS OR DRINKS TO AVOID OR LIMIT  Smoking or chewing tobacco. Nicotine is one of the most potent stimulants to acid production in the gastrointestinal tract.  Caffeinated and decaffeinated coffee and black tea.  Regular or low-calorie carbonated beverages or energy drinks (caffeine-free carbonated beverages are allowed).   Strong spices, such as black pepper, white pepper, red pepper, cayenne, curry powder, and chili powder.  Peppermint or spearmint.  Chocolate.  High-fat foods, including meats and fried foods. Extra added fats including oils, butter, salad dressings, and nuts. Limit these to less than 8 tsp per day.  Fruits and vegetables if they are not tolerated, such as citrus fruits or tomatoes.  Alcohol.  Any food that seems to aggravate your condition. If you have questions regarding your diet, call your caregiver or a registered dietitian. OTHER THINGS THAT MAY HELP GERD INCLUDE:   Eating your meals slowly, in a relaxed setting.  Eating 5 to 6 small meals per day instead of 3 large meals.  Eliminating food for a period of time if it causes distress.  Not lying down until 3 hours after eating a meal.  Keeping the head of your bed raised 6 to 9 inches (15 to 23 cm) by using a foam wedge or blocks under the legs of the bed. Lying flat may make symptoms worse.  Being physically active. Weight loss may be helpful in reducing reflux in overweight or obese adults.  Wear loose fitting clothing EXAMPLE MEAL PLAN This meal plan is approximately  2,000 calories based on [email or web address] meal planning guidelines. Breakfast   cup cooked oatmeal.  1 cup strawberries.  1 cup low-fat milk.  1 oz almonds. Snack  1 cup cucumber slices.  6 oz yogurt (made from low-fat or fat-free milk). Lunch  2 slice whole-wheat bread.  2 oz sliced [place].  2 tsp mayonnaise.  1 cup blueberries.  1 cup snap peas. Snack  6 whole-wheat crackers.  1 oz string cheese. Dinner   cup brown rice.  1 cup mixed veggies.  1 tsp olive oil.  3 oz grilled fish. Document Released: 06/27/2005 Document Revised: 09/19/2011 Document Reviewed: 05/13/2011 [facility] Patient Information 2014 [place], [place].  [name]'s Esophagus [name]'s esophagus occurs when the lining of the esophagus is damaged. The esophagus is the tube that carries food from the mouth to the stomach. With [name]'s esophagus, the lining of the esophagus gets replaced by material that is similar to the lining in the intestines. This process is called intestinal metaplasia. A small number of people with [name]'s esophagus develop esophageal cancer. CAUSES  The exact cause of [name]'s esophagus is unknown. SYMPTOMS  Most people with [name]'s esophagus do not have symptoms. However, many patients also have gastroesophageal reflux disease (GERD). GERD can cause heartburn, trouble swallowing, and a dry cough. DIAGNOSIS [name]'s esophagus is diagnosed by an exam called upper gastrointestinal endoscopy. A thin, flexible tube (endoscope) is passed down the esophagus. The endoscope has a light and camera on the end. Your caregiver uses the endoscope to view the inside of the esophagus. A tissue sample may also be taken and examined  under a microscope (biopsy). If cancer cells are found during the biopsy, this condition is called dysplasia. TREATMENT  If you have no dysplasia or low-grade dysplasia, your caregiver may recommend no treatment or only taking medicines to treat GERD.  Sometimes, taking acid-blocking drugs to treat GERD helps improve the tissue affected by [name]'s esophagus. Your caregiver may also recommend periodic esophageal exams. If you have high-grade dysplasia, treatment may include removing the damaged parts of the esophagus. This can be done by heating, freezing, or surgically removing the tissue. In some cases, surgery may be done to remove most of the esophagus. The stomach is then attached to the remaining portion of the esophagus. HOME CARE INSTRUCTIONS  Take acid-blocking drugs for GERD if recommended by your caregiver.  Keep all follow-up appointments as directed by your caregiver. You may need periodic esophageal exams. SEEK IMMEDIATE MEDICAL CARE IF:  You have chest pain.  You have trouble swallowing.  You vomit blood or material that looks like coffee grounds.  Your stools are bright red or dark. Document Released: 09/17/2003 Document Revised: 12/27/2011 Document Reviewed: 09/06/2011 [facility] Patient Information 2014 [place].

## 2013-08-16 NOTE — ED Provider Notes (Signed)
CSN: [id number]     Arrival date & time 08/16/13  2056 History   First MD Initiated Contact with Patient 08/16/13 2111     Chief Complaint  Patient presents with  . Airway Obstruction   (Consider location/radiation/quality/duration/timing/severity/associated sxs/prior Treatment) HPI Comments: 73 yo female presents to ER with cc of "tenderloin stuck in throat" at approx 1930.  She feels like it is stuck in her esophagus.  PMH of HTN, LBBB, Hyperthyroidism, Tremor, Lung Cancer, Melenoma, and cervical cancer.    Pt feels like the symptoms are relaxing currently. She hasn't tried to drink anything, but states her saliva is coming up.    Prior to meal pt was without symptoms.  No recent illnesses.    Pt has a h/o esophageal stretching approx 2 years ago.    Patient is a 73 y.o. female presenting with foreign body swallowed. The history is provided by the patient and the spouse.  Swallowed Foreign Body This is a new problem. The problem occurs constantly. The problem has not changed since onset.Pertinent negatives include no chest pain, no abdominal pain, no headaches and no shortness of breath. Nothing aggravates the symptoms. Nothing relieves the symptoms. She has tried nothing for the symptoms.    Past Medical History  Diagnosis Date  . LBBB (left bundle branch block)   . Hypertension   . Hyperthyroidism     dr Loanne Drilling- radioactive iodine treatment 2011- resolved  . Tremor     hand- intention tremor  . Complication of anesthesia     difficulty awakening, dysrhythmia post surgery  . lung ca dx'd 07/2008    lung- right non small cell, adenocarcinoma w/ broncho- alveolar features  . Melanoma     left leg  . Cervical ca dx'd 1988    surg only   Past Surgical History  Procedure Laterality Date  . Tonsillectomy    . Conization for cervical dysplasia    . Rul lobectomy  2010  . Esophogus stretching     Family History  Problem Relation Age of Onset  . Asthma Mother   . Heart  disease Mother   . Thyroid disease Daughter     hypothyroidism  . Cancer Maternal Uncle     lung  . Cancer Maternal Grandfather     lung  . Diabetes Neg Hx   . Coronary artery disease Neg Hx    History  Substance Use Topics  . Smoking status: Former Smoker -- 1.00 packs/day for 17 years    Types: Cigarettes    Quit date: 09/28/1975  . Smokeless tobacco: Never Used     Comment: 33 yrs ago  . Alcohol Use: Yes     Comment: glass wine daily   OB History   Grav Para Term Preterm Abortions TAB SAB Ect Mult Living                 Review of Systems  Constitutional: Negative.   HENT: Negative.   Eyes: Negative.   Respiratory: Negative for shortness of breath.   Cardiovascular: Negative for chest pain.  Gastrointestinal: Positive for nausea. Negative for vomiting, abdominal pain, diarrhea, constipation, blood in stool, abdominal distention, anal bleeding and rectal pain.  Endocrine: Negative.   Genitourinary: Negative.   Musculoskeletal: Negative.   Skin: Negative.   Neurological: Negative for headaches.    Allergies  Meperidine hcl; Morphine; Penicillins; Oxycodone-acetaminophen; Propoxyphene n-acetaminophen; and Tramadol hcl  Home Medications   Current Outpatient Rx  Name  Route  Sig  Dispense  Refill  . Aspirin-Salicylamide-Caffeine (BC HEADACHE POWDER PO)   Oral   Take 1 packet by mouth 3 (three) times daily.          Marland Kitchen atenolol (TENORMIN) 25 MG tablet   Oral   Take 25 mg by mouth 2 (two) times daily.         . calcium-vitamin D (OSCAL WITH D) 500-200 MG-UNIT per tablet   Oral   Take 1 tablet by mouth daily.          . celecoxib (CELEBREX) 200 MG capsule   Oral   Take 200 mg by mouth daily as needed. For pain         . diltiazem (DILACOR XR) 240 MG 24 hr capsule   Oral   Take 240 mg by mouth daily.         . drospirenone-estradiol (ANGELIQ) 0.5-1 MG per tablet   Oral   Take 1 tablet by mouth daily.         Marland Kitchen esomeprazole (NEXIUM) 40 MG  capsule   Oral   Take 40 mg by mouth daily before breakfast.         . fexofenadine (ALLEGRA) 30 MG tablet   Oral   Take 15 mg by mouth daily as needed (allergies).         . Homeopathic Products (ZICAM ALLERGY RELIEF NA)   Each Nare   Place 1 spray into both nostrils 2 (two) times daily as needed (allergies).         . hydrochlorothiazide (HYDRODIURIL) 25 MG tablet   Oral   Take 25 mg by mouth daily.         . Multiple Vitamin (MULITIVITAMIN WITH MINERALS) TABS   Oral   Take 1 tablet by mouth daily.         . Multiple Vitamins-Minerals (PRESERVISION AREDS 2 PO)   Oral   Take 2 tablets by mouth daily.          BP 128/52  Pulse 74  Temp(Src) 97 F (36.1 C) (Oral)  Resp 14  Wt 157 lb 9 oz (71.47 kg)  SpO2 99% Physical Exam  Vitals reviewed. Constitutional: She is oriented to person, place, and time. She appears well-developed and well-nourished.  HENT:  Head: Normocephalic and atraumatic.  Eyes: Conjunctivae are normal. Pupils are equal, round, and reactive to light. Right eye exhibits no discharge. Left eye exhibits no discharge.  Neck: Normal range of motion. Neck supple. No JVD present. No tracheal deviation present.  Cardiovascular: Normal rate and regular rhythm.   Pulmonary/Chest: Effort normal and breath sounds normal. No stridor. She has no wheezes. She has no rales.  Abdominal: Soft. Bowel sounds are normal. She exhibits no distension and no mass. There is no tenderness. There is no rebound and no guarding.  Musculoskeletal: Normal range of motion. She exhibits no edema and no tenderness.  Neurological: She is alert and oriented to person, place, and time. She has normal reflexes.  Skin: Skin is warm and dry.    ED Course  Procedures (including critical care time) Labs Review Labs Reviewed  CBC - Abnormal; Notable for the following:    WBC 12.9 (*)    All other components within normal limits  BASIC METABOLIC PANEL - Abnormal; Notable for the  following:    Potassium 3.4 (*)    Glucose, Bld 157 (*)    Creatinine, Ser 1.27 (*)    GFR calc non Af Amer 41 (*)    GFR  calc Af Amer 48 (*)    All other components within normal limits   Imaging Review Dg Neck Soft Tissue  08/16/2013   CLINICAL DATA:  73 year old female with airway obstruction. History of lung than cervical cancer.  EXAM: NECK SOFT TISSUES - 1+ VIEW  COMPARISON:  CT HEAD WO/W CM dated 09/18/2008  FINDINGS: 2 mm anterolisthesis of C3 in relation to C2 and C4 noted.  There is no evidence of acute fracture.  Moderate to severe degenerative disc disease trauma spondylosis and facet arthropathy throughout the cervical spine identified.  The visualized airway is unremarkable.  There is no evidence of prevertebral soft tissue swelling.  The epiglottis and aryepiglottic folds are unremarkable.  IMPRESSION: No evidence of soft tissue abnormality or airway narrowing on this lateral view.  Degenerative changes throughout the cervical spine.   Electronically Signed   By: Hassan Rowan M.D.   On: 08/16/2013 22:01   Dg Chest 2 View  08/16/2013   CLINICAL DATA:  73 year old female with airway obstruction. History of lung cancer.  EXAM: CHEST  2 VIEW  COMPARISON:  CT CHEST W/CM dated 08/05/2013; CT CHEST W/CM dated 01/16/2013; DG CHEST 2 VIEW dated 09/20/2011; DG CHEST 2 VIEW dated 09/02/2011  FINDINGS: Left apical opacity/ scarring and thoracic postoperative changes are again noted.  The cardiomediastinal silhouette is stable.  There is no evidence of focal airspace disease, pulmonary edema, pleural effusion, or pneumothorax.  No acute bony abnormalities are identified.  IMPRESSION: No acute cardiopulmonary disease.   Electronically Signed   By: Hassan Rowan M.D.   On: 08/16/2013 22:03     Date: 08/16/2013 @ 2206  Rate: 68  Rhythm: normal sinus rhythm  QRS Axis: normal  Intervals: normal  ST/T Wave abnormalities: normal  Conduction Disutrbances:left bundle branch block  Narrative Interpretation:   Old EKG  Reviewed: unchanged    MDM   1. GERD with stricture    73 year old white female presents emergency department with chief complaint of meat impaction. Patient states that around 1900 she got a piece of meat stuck in her throat. Throughout her ER stay she felt like her symptoms were improving significantly.  ER workup revealed mild leukocytosis on CBC. Hypokalemia at 3.4 and creatinine of 1.27 with glucose of 856 on basic metabolic panel. Chest x-ray and plain films of neck soft tissue did not reveal acute findings.  At approximately 2300 patient stated she was symptom free and was swallowing without difficulty. Patient was given soda to drink which she did without issue. At this point I do not feel further evaluation is necessary. Patient and her husband are requesting to go home. She is followed by Dr. Hilarie Fredrickson and will contact his office immediately for outpatient followup.  She will likely need a EGD with possible dilation. ER precautions were given and the patient and her husband agree with plan.  Elmer Sow, MD 08/16/13 [phone]

## 2013-08-16 NOTE — ED Notes (Signed)
Pt given sprite 

## 2013-08-16 NOTE — ED Notes (Signed)
Pt in waiting room talking to husband without difficulty.

## 2013-08-16 NOTE — ED Notes (Signed)
Patient ate around 1900, has a piece of tenderloin in her throat.  She has coughed some stuff up but still feels as if there is still something in her throat.  Patient does have a feeling of discomfort in throat.

## 2013-08-21 ENCOUNTER — Encounter (HOSPITAL_COMMUNITY): Payer: Self-pay

## 2013-08-21 ENCOUNTER — Encounter (HOSPITAL_COMMUNITY)
Admission: RE | Admit: 2013-08-21 | Discharge: 2013-08-21 | Disposition: A | Discharge status: Home / Self Care | Payer: Medicare Other | Source: Ambulatory Visit | Attending: Internal Medicine | Admitting: Internal Medicine

## 2013-08-21 DIAGNOSIS — J984 Other disorders of lung: Secondary | ICD-10-CM | POA: Insufficient documentation

## 2013-08-21 DIAGNOSIS — R918 Other nonspecific abnormal finding of lung field: Secondary | ICD-10-CM | POA: Diagnosis not present

## 2013-08-21 DIAGNOSIS — C771 Secondary and unspecified malignant neoplasm of intrathoracic lymph nodes: Secondary | ICD-10-CM | POA: Diagnosis not present

## 2013-08-21 DIAGNOSIS — C3490 Malignant neoplasm of unspecified part of unspecified bronchus or lung: Secondary | ICD-10-CM | POA: Diagnosis not present

## 2013-08-21 LAB — GLUCOSE, CAPILLARY: GLUCOSE-CAPILLARY: 103 mg/dL — AB (ref 70–99)

## 2013-08-21 MED ORDER — FLUDEOXYGLUCOSE F - 18 (FDG) INJECTION
11.3000 | Freq: Once | INTRAVENOUS | Status: AC | PRN
  Administered 2013-08-21: 11.3 via INTRAVENOUS

## 2013-08-26 ENCOUNTER — Other Ambulatory Visit: Payer: Self-pay | Admitting: Internal Medicine

## 2013-08-29 ENCOUNTER — Ambulatory Visit (HOSPITAL_BASED_OUTPATIENT_CLINIC_OR_DEPARTMENT_OTHER): Payer: Medicare Other | Admitting: Internal Medicine

## 2013-08-29 ENCOUNTER — Telehealth: Payer: Self-pay | Admitting: *Deleted

## 2013-08-29 ENCOUNTER — Encounter: Payer: Self-pay | Admitting: Internal Medicine

## 2013-08-29 ENCOUNTER — Telehealth: Payer: Self-pay | Admitting: Internal Medicine

## 2013-08-29 VITALS — BP 155/65 | HR 56 | Temp 97.7°F | Resp 18 | Ht 65.0 in | Wt 158.5 lb

## 2013-08-29 DIAGNOSIS — C3410 Malignant neoplasm of upper lobe, unspecified bronchus or lung: Secondary | ICD-10-CM | POA: Diagnosis not present

## 2013-08-29 DIAGNOSIS — C3490 Malignant neoplasm of unspecified part of unspecified bronchus or lung: Secondary | ICD-10-CM

## 2013-08-29 DIAGNOSIS — I10 Essential (primary) hypertension: Secondary | ICD-10-CM

## 2013-08-29 DIAGNOSIS — E0590 Thyrotoxicosis, unspecified without thyrotoxic crisis or storm: Secondary | ICD-10-CM

## 2013-08-29 DIAGNOSIS — C4370 Malignant melanoma of unspecified lower limb, including hip: Secondary | ICD-10-CM

## 2013-08-29 MED ORDER — PROCHLORPERAZINE MALEATE 10 MG PO TABS: 10.0000 mg | ORAL_TABLET | Freq: Four times a day (QID) | ORAL | Status: DC | PRN

## 2013-08-29 NOTE — Telephone Encounter (Signed)
Per staff message and POF I have scheduled appts.  [name]  

## 2013-08-29 NOTE — Progress Notes (Signed)
[facility] Telephone:([phone]) [phone]   [phone]) [phone]  OFFICE PROGRESS NOTE  [name], MD [address] [place] [address]  PRINCIPAL DIAGNOSES:  1. 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 The patient presented at that time with a right upper lobe lesion, as well as suspicious ground-glass opacities in the left lung. The tumor was negative for EGFR mutation and negative for ALK gene translocation. Veristrat test Good. EGFR mutation studies were conflicting, initial test was negative, second test was positive for EGFR mutation in exon 18 and the the test from [facility] one was negative with positive KRAS mutation in Exon 12. 2. Stage IA malignant melanoma, status post wide excision by Dr. Allyson Sabal on Nov 12, 2008.  PRIOR THERAPY:  1) Status post right upper lobectomy with lymph node dissection under the care of Dr. Arlyce Dice on May 21, 2008.  2) Stereotactic radiotherapy to the left upper lobe lung nodule under the care of Dr. Pablo Ledger expected to be completed on 10/21/2011.   CURRENT THERAPY: Concurrent chemoradiation with weekly carboplatin for AUC of 2 and paclitaxel 45 mg/M2. First dose 09/09/2013.  INTERVAL HISTORY: [name] 73 y.o. female returns to the clinic today for follow up visit accompanied by her husband. The patient is feeling fine today with no specific complaints. She denied having any significant chest pain, shortness of breath, cough or hemoptysis. She denied having any significant weight loss or night sweats. The patient denied having any fever or chills and no nausea or vomiting. She was found on recent CT scan of the chest to have left prevascular lymphadenopathy suspicious for disease recurrence. I ordered a PET scan which was performed recently and the patient is here today for evaluation of her scan results and  recommendation regarding treatment of her condition.  MEDICAL HISTORY: Past Medical History  Diagnosis Date  . LBBB (left bundle branch block)   . Hypertension   . Hyperthyroidism     dr Loanne Drilling- radioactive iodine treatment 2011- resolved  . Tremor     hand- intention tremor  . Complication of anesthesia     difficulty awakening, dysrhythmia post surgery  . lung ca dx'd 07/2008    lung- right non small cell, adenocarcinoma w/ broncho- alveolar features  . Melanoma     left leg  . Cervical ca dx'd 1988    surg only    ALLERGIES:  is allergic to meperidine hcl; morphine; penicillins; oxycodone-acetaminophen; propoxyphene n-acetaminophen; and tramadol hcl.  MEDICATIONS:  Current Outpatient Prescriptions  Medication Sig Dispense Refill  . ANGELIQ 0.5-1 MG per tablet TAKE 1 TABLET BY MOUTH EVERY DAY  28 tablet  11  . Aspirin-Salicylamide-Caffeine (BC HEADACHE POWDER PO) Take 1 packet by mouth 3 (three) times daily.       Marland Kitchen atenolol (TENORMIN) 25 MG tablet Take 25 mg by mouth 2 (two) times daily.      . calcium-vitamin D (OSCAL WITH D) 500-200 MG-UNIT per tablet Take 1 tablet by mouth daily.       . celecoxib (CELEBREX) 200 MG capsule Take 200 mg by mouth daily as needed. For pain      . diltiazem (DILACOR XR) 240 MG 24 hr capsule Take 240 mg by mouth daily.      . drospirenone-estradiol (ANGELIQ) 0.5-1 MG per tablet Take 1 tablet by mouth daily.      Marland Kitchen esomeprazole (NEXIUM) 40  MG capsule Take 40 mg by mouth daily before breakfast.      . fexofenadine (ALLEGRA) 30 MG tablet Take 15 mg by mouth daily as needed (allergies).      . Homeopathic Products (ZICAM ALLERGY RELIEF NA) Place 1 spray into both nostrils 2 (two) times daily as needed (allergies).      . hydrochlorothiazide (HYDRODIURIL) 25 MG tablet Take 25 mg by mouth daily.      . Multiple Vitamin (MULITIVITAMIN WITH MINERALS) TABS Take 1 tablet by mouth daily.      . Multiple Vitamins-Minerals (PRESERVISION AREDS 2 PO) Take 2  tablets by mouth daily.       No current facility-administered medications for this visit.    SURGICAL HISTORY:  Past Surgical History  Procedure Laterality Date  . Tonsillectomy    . Conization for cervical dysplasia    . Rul lobectomy  2010  . Esophogus stretching      REVIEW OF SYSTEMS:  Constitutional: negative Eyes: negative Ears, nose, mouth, throat, and face: negative Respiratory: negative Cardiovascular: negative Gastrointestinal: negative Genitourinary:negative Integument/breast: negative Hematologic/lymphatic: negative Musculoskeletal:negative Neurological: negative Behavioral/Psych: negative Endocrine: negative Allergic/Immunologic: negative   PHYSICAL EXAMINATION: General appearance: alert, cooperative and no distress Head: Normocephalic, without obvious abnormality, atraumatic Neck: no adenopathy Lymph nodes: Cervical, supraclavicular, and axillary nodes normal. Resp: clear to auscultation bilaterally Cardio: regular rate and rhythm, S1, S2 normal, no murmur, click, rub or gallop GI: soft, non-tender; bowel sounds normal; no masses,  no organomegaly Extremities: extremities normal, atraumatic, no cyanosis or edema Neurologic: Alert and oriented X 3, normal strength and tone. Normal symmetric reflexes. Normal coordination and gait  ECOG PERFORMANCE STATUS: 0 - Asymptomatic  Blood pressure 155/65, pulse 56, temperature 97.7 F (36.5 C), temperature source Oral, resp. rate 18, height '5\' 5"'  (1.651 m), weight 158 lb 8 oz (71.895 kg).  LABORATORY DATA: Lab Results  Component Value Date   WBC 12.9* 08/16/2013   HGB 14.6 08/16/2013   HCT 42.8 08/16/2013   MCV 93.7 08/16/2013   PLT 206 08/16/2013      Chemistry      Component Value Date/Time   NA 140 08/16/2013 2138   NA 142 08/05/2013 0944   NA 137 01/16/2012 0941   K 3.4* 08/16/2013 2138   K 3.8 08/05/2013 0944   K 4.1 01/16/2012 0941   CL 99 08/16/2013 2138   CL 105 07/17/2012 1434   CL 100 01/16/2012 0941   CO2 24  08/16/2013 2138   CO2 27 08/05/2013 0944   CO2 28 01/16/2012 0941   BUN 23 08/16/2013 2138   BUN 26.4* 08/05/2013 0944   BUN 20 01/16/2012 0941   CREATININE 1.27* 08/16/2013 2138   CREATININE 1.4* 08/05/2013 0944   CREATININE 1.2 01/16/2012 0941      Component Value Date/Time   CALCIUM 9.4 08/16/2013 2138   CALCIUM 9.6 08/05/2013 0944   CALCIUM 9.1 01/16/2012 0941   ALKPHOS 55 08/05/2013 0944   ALKPHOS 46 01/16/2012 0941   ALKPHOS 50 10/18/2011 1415   AST 14 08/05/2013 0944   AST 23 01/16/2012 0941   AST 17 10/18/2011 1415   ALT 13 08/05/2013 0944   ALT 21 01/16/2012 0941   ALT 12 10/18/2011 1415   BILITOT 0.44 08/05/2013 0944   BILITOT 0.60 01/16/2012 0941   BILITOT 0.4 10/18/2011 1415       RADIOGRAPHIC STUDIES:  Dg Neck Soft Tissue  08/16/2013   CLINICAL DATA:  73 year old female with airway obstruction. History of lung  than cervical cancer.  EXAM: NECK SOFT TISSUES - 1+ VIEW  COMPARISON:  CT HEAD WO/W CM dated 09/18/2008  FINDINGS: 2 mm anterolisthesis of C3 in relation to C2 and C4 noted.  There is no evidence of acute fracture.  Moderate to severe degenerative disc disease trauma spondylosis and facet arthropathy throughout the cervical spine identified.  The visualized airway is unremarkable.  There is no evidence of prevertebral soft tissue swelling.  The epiglottis and aryepiglottic folds are unremarkable.  IMPRESSION: No evidence of soft tissue abnormality or airway narrowing on this lateral view.  Degenerative changes throughout the cervical spine.   Electronically Signed   By: Hassan Rowan M.D.   On: 08/16/2013 22:01   Dg Chest 2 View  08/16/2013   CLINICAL DATA:  73 year old female with airway obstruction. History of lung cancer.  EXAM: CHEST  2 VIEW  COMPARISON:  CT CHEST W/CM dated 08/05/2013; CT CHEST W/CM dated 01/16/2013; DG CHEST 2 VIEW dated 09/20/2011; DG CHEST 2 VIEW dated 09/02/2011  FINDINGS: Left apical opacity/ scarring and thoracic postoperative changes are again noted.  The cardiomediastinal silhouette  is stable.  There is no evidence of focal airspace disease, pulmonary edema, pleural effusion, or pneumothorax.  No acute bony abnormalities are identified.  IMPRESSION: No acute cardiopulmonary disease.   Electronically Signed   By: Hassan Rowan M.D.   On: 08/16/2013 22:03   Ct Chest W Contrast  08/05/2013   CLINICAL DATA:  Lung cancer diagnosed 1/10. Radiation therapy complete in 2013. Right upper lobectomy.  EXAM: CT CHEST WITH CONTRAST  TECHNIQUE: Multidetector CT imaging of the chest was performed during intravenous contrast administration.  CONTRAST:  57m OMNIPAQUE IOHEXOL 300 MG/ML  SOLN  COMPARISON:  CT CHEST W/CM dated 01/16/2013; CT CHEST W/CM dated 10/15/2012  FINDINGS: Lungs/Pleura: Surgical changes of right upper lobectomy. Centrilobular emphysema.  Radiation changes in the left upper lobe, similar.  Scarring in the left lower lobe.  A left upper lobe soft tissue nodule measures 6 mm on image 13/series 5 and is new.  Medial left lower lobe 5 mm nodule on image 46 is similar to slightly more conspicuous today.  Multi focal ground-glass nodules are again identified. Example left upper lobe at 1.8 x 1.2 cm today versus 1.6 x 1.3 cm on the prior. Felt to be similar.  Left lower lobe 1.9 x 1.4 cm on image 23 versus similar on the prior. Scattered smaller ground-glass right-sided nodules are similar.  No pleural fluid.  Heart/Mediastinum: Aortic and branch vessel atherosclerosis. Mild cardiomegaly with coronary artery atherosclerosis. No central pulmonary embolism, on this non-dedicated study. Enlargement of a prevascular node, which measures 1.2 x 1.8 cm on image 20 versus 1.5 x 0.9 cm on the prior. Small to moderate hiatal hernia. No hilar adenopathy.  Upper Abdomen: Upper pole right renal lesion which is likely a cyst or minimally complex cyst. Similar in size to on the prior exam, but incompletely imaged at 2.5 cm. Too small to characterize upper pole left renal lesion. Mild left adrenal thickening which is  unchanged.  Similar common hepatic artery aneurysm ; 1.0 cm. Celiac axis narrowing with mild poststenotic dilatation. Sagittal image 54. Similar.  Bones/Musculoskeletal:  Moderate osteopenia.  .Marland Kitchen IMPRESSION: 1. Developing prevascular adenopathy, most consistent with disease progression. 2. Developing soft tissue nodule in the left upper lobe. Equivocally progressive left lower lobe lung nodule. Findings are suspicious for pulmonary metastasis. 3. Other ground-glass nodules which are felt to be similar and are again suspicious  for multifocal low-grade adenocarcinoma. 4. Status post right upper lobectomy with radiation changes in the left upper lobe. 5. 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   Electronically Signed   By: Abigail Miyamoto M.D.   On: 08/05/2013 14:21   Nm Pet Image Restag (ps) Skull Base To Thigh  08/21/2013   CLINICAL DATA:  Subsequent treatment strategy for lung cancer.  EXAM: NUCLEAR MEDICINE PET SKULL BASE TO THIGH  FASTING BLOOD GLUCOSE:  Value: 103 mg/dl  TECHNIQUE: 11.3 mCi F-18 FDG was injected intravenously. CT data was obtained and used for attenuation correction and anatomic localization.  COMPARISON:  CT BIOPSY dated 09/15/2011; NM PET IMAGE RESTAG (PS) SKULL BASE TO THIGH dated 08/06/2010  FINDINGS: NECK  No hypermetabolic lymph nodes in the neck.  CHEST  There is a new hypermetabolic prevascular lymph node measuring 16 mm short axis with SUV max 9.9.  There are several ill-defined ground-glass opacities in the left upper lobe with very low metabolic activity. There is linear pleural parenchymal thickening in the more superior left upper lobe without significant hypermetabolic activity suggesting radiation change.  ABDOMEN/PELVIS  No abnormal hypermetabolic activity within the liver, pancreas, adrenal glands, or spleen. No hypermetabolic lymph nodes in the abdomen or pelvis.  SKELETON  No focal hypermetabolic activity to suggest skeletal metastasis.  IMPRESSION: 1. New  hypermetabolic prevascular lymph node is consistent with mediastinal nodal metastasis. 2. Ground-glass nodules in the left upper lobe are likely infectious or inflammatory. Recommend attention on follow-up. 3. Linear pleural parenchymal thickening and scarring in the left upper lobe with low metabolic activity is likely related to radiation therapy.   Electronically Signed   By: Suzy Bouchard M.D.   On: 08/21/2013 13:07     ASSESSMENT AND PLAN: this is a very pleasant 73 years old white female with recurrent non-small cell lung cancer, adenocarcinoma with conflicting reports regarding the EGFR mutation including the recent one from the biopsy of the left upper lobe lung nodule that showed mutation at exon 18. Her CT scan of the chest as well as the PET scan showed no significant changes except for enlarging prevascular lymphadenopathy which was hypermetabolic suspicious for disease progression. I discussed the scan results with the patient and her husband and showed them the images.  I have a lengthy discussion with the patient and her husband about her treatment options. I recommended for her a course of concurrent chemoradiation with weekly carboplatin for AUC of 2 and paclitaxel 45 mg/M2 for a period of 6-7 weeks. I discussed with the patient and her husband the adverse effect of the chemotherapy including but not limited to alopecia, myelosuppression, nausea and vomiting, peripheral neuropathy, liver or renal dysfunction. The patient is interested in the treatment and I would refer her to Dr. Pablo Ledger for evaluation and discussion of the radiotherapy option. If she agreed to this course of concurrent chemoradiation, I expect the patient to start the first dose of her chemotherapy on 09/09/2013. I will arrange for the patient to have a chemotherapy education class before starting the first dose of her treatment. I will called her pharmacy with prescription for Compazine 10 mg by mouth every 6  hours as needed for nausea. She would come back for follow up visit in 3 weeks for evaluation and management any adverse effect of her treatment. I would see her back for followup visit in 3 weeks for evaluation and discussion of her treatment options based on the PET scan results. The patient was  advised to call immediately if she has any concerning symptoms in the interval.  All questions were answered. The patient knows to call the clinic with any problems, questions or concerns. We can certainly see the patient much sooner if necessary.  Disclaimer: This note was dictated with voice recognition software. Similar sounding words can inadvertently be transcribed and may not be corrected upon review.

## 2013-08-29 NOTE — Telephone Encounter (Signed)
, °

## 2013-08-30 ENCOUNTER — Ambulatory Visit
Admission: RE | Admit: 2013-08-30 | Discharge: 2013-08-30 | Disposition: A | Discharge status: Home / Self Care | Payer: Medicare Other | Source: Ambulatory Visit | Attending: Radiation Oncology | Admitting: Radiation Oncology

## 2013-08-30 ENCOUNTER — Inpatient Hospital Stay: Admission: RE | Admit: 2013-08-30 | Payer: Medicare Other | Source: Ambulatory Visit | Admitting: Radiation Oncology

## 2013-08-30 ENCOUNTER — Encounter: Payer: Self-pay | Admitting: Radiation Oncology

## 2013-08-30 ENCOUNTER — Ambulatory Visit: Admission: RE | Admit: 2013-08-30 | Payer: Medicare Other | Source: Ambulatory Visit | Admitting: Radiation Oncology

## 2013-08-30 VITALS — BP 171/59 | HR 62 | Temp 97.9°F | Resp 20 | Wt 159.5 lb

## 2013-08-30 DIAGNOSIS — C3410 Malignant neoplasm of upper lobe, unspecified bronchus or lung: Secondary | ICD-10-CM | POA: Diagnosis not present

## 2013-08-30 DIAGNOSIS — C3490 Malignant neoplasm of unspecified part of unspecified bronchus or lung: Secondary | ICD-10-CM

## 2013-08-30 HISTORY — DX: Reserved for inherently not codable concepts without codable children: IMO0001

## 2013-08-30 HISTORY — DX: Reserved for concepts with insufficient information to code with codable children: IMO0002

## 2013-08-30 NOTE — Progress Notes (Addendum)
Department of Radiation Oncology  Phone:  [phone] Fax:        [phone]   Name: [name] MRN: [id number]  DOB: [birth year]  Date: 08/30/2013  Follow Up Visit Note  Diagnosis: Multifocal bronchoalveolar carcinoma  Summary and Interval since last radiation: SBRT to a left upper lobe nodule to a total dose of 60 gray in 5 fractions completed April 2013  Interval History: [name] presents today for routine followup.  She is about 2 years out from treatment to her left upper lobe nodule. She is feeling well and doing well. She had a screening CT scan performed by Dr. Julien Nordmann in January which showed progression of prevascular adenopathy measuring about a centimeter and half. A PET scan was performed on 08/21/2013 which showed an SUV of 9.9. Ill-defined groundglass opacities in the left upper lobe had low metabolic activity and the area in the left upper lobe with thickening showed no hypermetabolic activity suggesting radiation change. She was in the hospital a few weeks ago for difficulties with swallowing. She has been diagnosed with esophageal spasm. It took about 2-1/2 hours for the food and finally passed through. No abnormality has been seen. She also has significant difficulties with gastric esophageal reflux disease. She is on many medications and sleep sitting up at night. She is accompanied by her husband. She has discussed chemoradiation with Dr. Julien Nordmann for this prevascular lymph node. She is attending a tennis conference in [place] in April and would like to be done by April 8. She has no hemoptysis. No shortness of breath.  Allergies:  Allergies  Allergen Reactions  . Meperidine Hcl Other (See Comments)    "knocked pt out for 4 days"  . Morphine Other (See Comments)    Stopped breathing  . Penicillins Other (See Comments)    Difficulty breathing  . Oxycodone-Acetaminophen Itching       . Propoxyphene N-Acetaminophen Nausea And Vomiting  . Tramadol Hcl Nausea And  Vomiting    Medications:  Current Outpatient Prescriptions  Medication Sig Dispense Refill  . ANGELIQ 0.5-1 MG per tablet TAKE 1 TABLET BY MOUTH EVERY DAY  28 tablet  11  . Aspirin-Salicylamide-Caffeine (BC HEADACHE POWDER PO) Take 1 packet by mouth 3 (three) times daily.       Marland Kitchen atenolol (TENORMIN) 25 MG tablet Take 25 mg by mouth 2 (two) times daily.      . calcium-vitamin D (OSCAL WITH D) 500-200 MG-UNIT per tablet Take 1 tablet by mouth daily.       . celecoxib (CELEBREX) 200 MG capsule Take 200 mg by mouth daily as needed. For pain      . diltiazem (DILACOR XR) 240 MG 24 hr capsule Take 240 mg by mouth daily.      . drospirenone-estradiol (ANGELIQ) 0.5-1 MG per tablet Take 1 tablet by mouth daily.      Marland Kitchen esomeprazole (NEXIUM) 40 MG capsule Take 40 mg by mouth daily before breakfast.      . fexofenadine (ALLEGRA) 30 MG tablet Take 15 mg by mouth daily as needed (allergies).      . Homeopathic Products (ZICAM ALLERGY RELIEF NA) Place 1 spray into both nostrils 2 (two) times daily as needed (allergies).      . hydrochlorothiazide (HYDRODIURIL) 25 MG tablet Take 25 mg by mouth daily.      . Multiple Vitamin (MULITIVITAMIN WITH MINERALS) TABS Take 1 tablet by mouth daily.      . Multiple Vitamins-Minerals (PRESERVISION AREDS 2 PO) Take  2 tablets by mouth daily.      . pseudoephedrine (SUDAFED) 30 MG tablet Take 30 mg by mouth 2 (two) times daily. 1/2 tab in am and 1/2 tab in evening      . prochlorperazine (COMPAZINE) 10 MG tablet Take 1 tablet (10 mg total) by mouth every 6 (six) hours as needed for nausea or vomiting.  60 tablet  0   No current facility-administered medications for this encounter.    Physical Exam:  Filed Vitals:   08/30/13 1336  BP: 171/59  Pulse: 62  Temp: 97.9 F (36.6 C)  Resp: 20  Weight: 159 lb 8 oz (72.349 kg)  SpO2: 100%   pleasant female in no distress sitting comfortably on examining table. She appears younger than her stated age.  IMPRESSION: [name]  is a 73 y.o. female status post SBRT to the left upper lobe and status post right upper lobectomy in January 2010  PLAN:  I talked to the patient and her husband. My initial recommendation was to treat the entire mediastinum with a boost to this positive lymph node. We discussed that likely other lymph nodes had cancer in them and we just treated this lymph node we would paint ourselves into a corner in terms of radiation dose to the rest of the mediastinum if a lymph node recurs elsewhere in the hilum or mediastinum which is likely will. Whether this will be accompanied by fulminant metastatic disease and when it will occur are unknowns. My recommendation was for 6-7 weeks of treatment to the mediastinum with concurrent chemoradiation. We discussed the side effects of that including esophagitis and fatigue. 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 She are to have significant difficulties in swallowing and does not wish to pursue any treatment which would affect her swallowing given that she likely has non-curative disease. I can appreciate that and we don't know that her other lymph nodes are involved. At present we'll may have this enlarging prevascular lymph node which is taken several months to become the size that it has currently. Given that the chemotherapy would be only at radiosensitizing doses we might be able to forego chemotherapy and just treat this with the stereotactic approach. This would allow her to preserve her function and quality of life well still treating the cancer we can see. She was the most comfortable with this approach although she was willing to undergo chemotherapy is necessary. I will discuss this with my colleagues as well as Dr. Earlie Server. I will give her a call back on Monday and plan for simulation either way on Tuesday or Wednesday of next week.    Thea Silversmith, MD

## 2013-08-30 NOTE — Progress Notes (Signed)
Please see the Nurse Progress Note in the MD Initial Consult Encounter for this patient. 

## 2013-08-30 NOTE — Progress Notes (Signed)
Thoracic Location of Tumor / Histology:Recurrent non-small cell lung cancer  Stage 1A.  Patient presented in 2010 with growing nodule of lung in which patient states she has been followed for approximately 25 years prior to diagnosis   Biopsy of left upper lobe lung revealed adenocarcinoma on 09/15/2011. Stage 1A malignant melanoma  Of left leg diagnosed Nov 12, 2008. Cervical cancer.   Tobacco/Marijuana/Snuff/ETOH J[facility]:IDUP 1997 post 1 ppd for 17 years.  Past/Anticipated interventions by cardiothoracic surgery, if BDH:DIXBO upper lobectomy with lymph node dissection on May 21, 2008 by Dr.Burney.  Past/Anticipated interventions by medical oncology, if ERQ:[id number]  Signs/Symptoms  Weight changes, if any:no except for mild increase in weight.  Respiratory complaints, if any:No  Hemoptysis, if any:No  Pain issues, if any: No  SAFETY ISSUES:  Prior radiation?Stereotactic radiotherapy of left upper lobe of lung under direction of Dr.Wentworth 10/11/11-10/21/11. Radioactive iodine for hyperthyroidism in 2011  Pacemaker/ICD? No  Possible current pregnancy? No  Is the patient on methotrexate? No  Current Complaints / other details:Pet scan 08/21/13 IMPRESSION:  1. New hypermetabolic prevascular lymph node is consistent with  mediastinal nodal metastasis.  2. Ground-glass nodules in the left upper lobe are likely infectious  or inflammatory. Recommend attention on follow-up.  3. Linear pleural parenchymal thickening and scarring in the left  upper lobe with low metabolic activity is likely related to  radiation therapy.  Patient here with husband.Asymptomatic, changes found on routine work-up.

## 2013-09-02 ENCOUNTER — Other Ambulatory Visit: Payer: Medicare Other

## 2013-09-04 ENCOUNTER — Ambulatory Visit
Admission: RE | Admit: 2013-09-04 | Discharge: 2013-09-04 | Disposition: A | Discharge status: Home / Self Care | Payer: Medicare Other | Source: Ambulatory Visit | Attending: Radiation Oncology | Admitting: Radiation Oncology

## 2013-09-04 DIAGNOSIS — T7840XA Allergy, unspecified, initial encounter: Secondary | ICD-10-CM | POA: Diagnosis not present

## 2013-09-04 DIAGNOSIS — R110 Nausea: Secondary | ICD-10-CM | POA: Diagnosis not present

## 2013-09-04 DIAGNOSIS — C3410 Malignant neoplasm of upper lobe, unspecified bronchus or lung: Secondary | ICD-10-CM | POA: Insufficient documentation

## 2013-09-04 DIAGNOSIS — Z510 Encounter for antineoplastic radiation therapy: Secondary | ICD-10-CM | POA: Insufficient documentation

## 2013-09-04 DIAGNOSIS — C342 Malignant neoplasm of middle lobe, bronchus or lung: Secondary | ICD-10-CM

## 2013-09-05 ENCOUNTER — Other Ambulatory Visit: Payer: Self-pay | Admitting: Medical Oncology

## 2013-09-05 ENCOUNTER — Telehealth: Payer: Self-pay | Admitting: Medical Oncology

## 2013-09-05 NOTE — Progress Notes (Signed)
[facility] Simulation and Treatment Planning Note   Name: CRESCENT GOTHAM MRN: [id number]  Date: 09/04/2013  DOB: 1941/05/16  Status: outpatient  DIAGNOSIS: There were no encounter diagnoses.  SIDE: left  CONSENT VERIFIED: yes  SET UP AND IMMOBILIZATION: Patient is setup supine in a vac loc with a custom moldable pillow for head and neck immobilization   NARRATIVE: The patient was brought to the [facility].  Identity was confirmed.  All relevant records and images related to the planned course of therapy were reviewed.  Then, the patient was positioned in a stable reproducible clinical set-up for radiation therapy.  CT images were obtained.  Skin markings were placed.  A four dimensional simulation was then performed to track tumor movement throughout the patients' breathing cycle. The CT images were loaded into the planning software where the target and avoidance structures were contoured.  The GTV was outlined on the free breathing, 4D and MIP image sets.  The radiation prescription was entered and confirmed.   TREATMENT PLANNING NOTE:  Treatment planning then occurred. I have requested 3D simulation with [facility] of the spinal cord, total lungs and gross tumor volume. I have also requested mlcs and an isodose plan.   Special treatment procedure will be performed as Shara Blazing will be receiving high dose per fraction.    RESPIRATORY MOTION MANAGEMENT SIMULATION - Deep Inspiration Breath Hold  NARRATIVE:  In order to account for effect of respiratory motion on target structures and other organs in the planning and delivery of radiotherapy, this patient underwent respiratory motion management simulation.  To accomplish this, when the patient was brought to the CT simulation planning suite, a bellows was placed on the her abdomen.  Wave forms of the patient's breathing were obtained. 4D CT was recreated and will be used to form the itv.

## 2013-09-05 NOTE — Telephone Encounter (Signed)
Pt stated she , Dr Julien Nordmann and rad oncologist  are changing her treatment plan to only targeted radiation, no chemo at this time.

## 2013-09-05 NOTE — Telephone Encounter (Signed)
Per Dr Julien Nordmann I cancelled all lab , chemo appts and sent [place] request to r/s f/u with [facility] for 1 month.

## 2013-09-06 ENCOUNTER — Telehealth: Payer: Self-pay | Admitting: Internal Medicine

## 2013-09-06 NOTE — Telephone Encounter (Signed)
sw. pt husband and advised on all cx appt and MD visit the end of March....ok and aware

## 2013-09-09 ENCOUNTER — Other Ambulatory Visit: Payer: Medicare Other

## 2013-09-09 ENCOUNTER — Ambulatory Visit: Payer: Medicare Other

## 2013-09-12 DIAGNOSIS — C3410 Malignant neoplasm of upper lobe, unspecified bronchus or lung: Secondary | ICD-10-CM | POA: Diagnosis not present

## 2013-09-13 DIAGNOSIS — R110 Nausea: Secondary | ICD-10-CM | POA: Diagnosis not present

## 2013-09-13 DIAGNOSIS — T7840XA Allergy, unspecified, initial encounter: Secondary | ICD-10-CM | POA: Diagnosis not present

## 2013-09-13 DIAGNOSIS — Z510 Encounter for antineoplastic radiation therapy: Secondary | ICD-10-CM | POA: Diagnosis not present

## 2013-09-13 DIAGNOSIS — C3410 Malignant neoplasm of upper lobe, unspecified bronchus or lung: Secondary | ICD-10-CM | POA: Diagnosis not present

## 2013-09-16 ENCOUNTER — Ambulatory Visit: Payer: Medicare Other

## 2013-09-16 ENCOUNTER — Ambulatory Visit
Admission: RE | Admit: 2013-09-16 | Discharge: 2013-09-16 | Disposition: A | Discharge status: Home / Self Care | Payer: Medicare Other | Source: Ambulatory Visit | Attending: Radiation Oncology | Admitting: Radiation Oncology

## 2013-09-16 ENCOUNTER — Encounter: Payer: Self-pay | Admitting: Radiation Oncology

## 2013-09-16 ENCOUNTER — Other Ambulatory Visit: Payer: Medicare Other

## 2013-09-16 ENCOUNTER — Ambulatory Visit: Payer: Medicare Other | Admitting: Internal Medicine

## 2013-09-16 DIAGNOSIS — R110 Nausea: Secondary | ICD-10-CM | POA: Diagnosis not present

## 2013-09-16 DIAGNOSIS — T7840XA Allergy, unspecified, initial encounter: Secondary | ICD-10-CM | POA: Diagnosis not present

## 2013-09-16 DIAGNOSIS — C3410 Malignant neoplasm of upper lobe, unspecified bronchus or lung: Secondary | ICD-10-CM | POA: Diagnosis not present

## 2013-09-16 DIAGNOSIS — Z510 Encounter for antineoplastic radiation therapy: Secondary | ICD-10-CM | POA: Diagnosis not present

## 2013-09-17 ENCOUNTER — Ambulatory Visit: Payer: Medicare Other | Admitting: Radiation Oncology

## 2013-09-18 ENCOUNTER — Ambulatory Visit
Admission: RE | Admit: 2013-09-18 | Discharge: 2013-09-18 | Disposition: A | Discharge status: Home / Self Care | Payer: Medicare Other | Source: Ambulatory Visit | Attending: Radiation Oncology | Admitting: Radiation Oncology

## 2013-09-18 DIAGNOSIS — Z510 Encounter for antineoplastic radiation therapy: Secondary | ICD-10-CM | POA: Diagnosis not present

## 2013-09-18 DIAGNOSIS — T7840XA Allergy, unspecified, initial encounter: Secondary | ICD-10-CM | POA: Diagnosis not present

## 2013-09-18 DIAGNOSIS — R110 Nausea: Secondary | ICD-10-CM | POA: Diagnosis not present

## 2013-09-18 DIAGNOSIS — C3410 Malignant neoplasm of upper lobe, unspecified bronchus or lung: Secondary | ICD-10-CM | POA: Diagnosis not present

## 2013-09-19 ENCOUNTER — Ambulatory Visit
Admission: RE | Admit: 2013-09-19 | Discharge: 2013-09-19 | Disposition: A | Discharge status: Home / Self Care | Payer: Medicare Other | Source: Ambulatory Visit | Attending: Radiation Oncology | Admitting: Radiation Oncology

## 2013-09-19 DIAGNOSIS — Z510 Encounter for antineoplastic radiation therapy: Secondary | ICD-10-CM | POA: Diagnosis not present

## 2013-09-19 DIAGNOSIS — T7840XA Allergy, unspecified, initial encounter: Secondary | ICD-10-CM | POA: Diagnosis not present

## 2013-09-19 DIAGNOSIS — C3410 Malignant neoplasm of upper lobe, unspecified bronchus or lung: Secondary | ICD-10-CM | POA: Diagnosis not present

## 2013-09-19 DIAGNOSIS — R110 Nausea: Secondary | ICD-10-CM | POA: Diagnosis not present

## 2013-09-20 ENCOUNTER — Ambulatory Visit
Admission: RE | Admit: 2013-09-20 | Discharge: 2013-09-20 | Disposition: A | Discharge status: Home / Self Care | Payer: Medicare Other | Source: Ambulatory Visit | Attending: Radiation Oncology | Admitting: Radiation Oncology

## 2013-09-20 DIAGNOSIS — Z510 Encounter for antineoplastic radiation therapy: Secondary | ICD-10-CM | POA: Diagnosis not present

## 2013-09-20 DIAGNOSIS — T7840XA Allergy, unspecified, initial encounter: Secondary | ICD-10-CM | POA: Diagnosis not present

## 2013-09-20 DIAGNOSIS — C3410 Malignant neoplasm of upper lobe, unspecified bronchus or lung: Secondary | ICD-10-CM | POA: Diagnosis not present

## 2013-09-20 DIAGNOSIS — R110 Nausea: Secondary | ICD-10-CM | POA: Diagnosis not present

## 2013-09-23 ENCOUNTER — Encounter: Payer: Self-pay | Admitting: Radiation Oncology

## 2013-09-23 ENCOUNTER — Ambulatory Visit
Admission: RE | Admit: 2013-09-23 | Discharge: 2013-09-23 | Disposition: A | Discharge status: Home / Self Care | Payer: Medicare Other | Source: Ambulatory Visit | Attending: Radiation Oncology | Admitting: Radiation Oncology

## 2013-09-23 ENCOUNTER — Ambulatory Visit: Payer: Medicare Other

## 2013-09-23 ENCOUNTER — Other Ambulatory Visit: Payer: Medicare Other

## 2013-09-23 VITALS — BP 175/63 | HR 62 | Temp 97.5°F | Resp 20 | Wt 157.9 lb

## 2013-09-23 DIAGNOSIS — T7840XA Allergy, unspecified, initial encounter: Secondary | ICD-10-CM | POA: Diagnosis not present

## 2013-09-23 DIAGNOSIS — C3410 Malignant neoplasm of upper lobe, unspecified bronchus or lung: Secondary | ICD-10-CM

## 2013-09-23 DIAGNOSIS — Z510 Encounter for antineoplastic radiation therapy: Secondary | ICD-10-CM | POA: Diagnosis not present

## 2013-09-23 DIAGNOSIS — R110 Nausea: Secondary | ICD-10-CM | POA: Diagnosis not present

## 2013-09-23 NOTE — Progress Notes (Signed)
Weekly Management Note:  Site: Left lung Current Dose:  2500  cGy Projected Dose: 5000  cGy  Narrative: The patient is seen today for routine under treatment assessment. CBCT/MVCT images/port films were reviewed. The chart was reviewed.   Conebeam CT set up is excellent today. She could not tolerate the abdominal compression paddle as well today because of a "hernia", but she was able to take shallow breaths.  Physical Examination:  Filed Vitals:   09/23/13 1324  BP: 175/63  Pulse: 62  Temp: 97.5 F (36.4 C)  Resp: 20  .  Weight: 157 lb 14.4 oz (71.623 kg). No change.  Impression: Tolerating radiation therapy well.  Plan: Continue radiation therapy as planned.

## 2013-09-23 NOTE — Progress Notes (Addendum)
Pt states she has "allergy issues", and she is taking Sudafed and Allegra. She has dry cough, SOB w/exertion. She denies pain, fatigue, loss of appetite.

## 2013-09-24 ENCOUNTER — Ambulatory Visit
Admission: RE | Admit: 2013-09-24 | Discharge: 2013-09-24 | Disposition: A | Discharge status: Home / Self Care | Payer: Medicare Other | Source: Ambulatory Visit | Attending: Radiation Oncology | Admitting: Radiation Oncology

## 2013-09-24 DIAGNOSIS — Z510 Encounter for antineoplastic radiation therapy: Secondary | ICD-10-CM | POA: Diagnosis not present

## 2013-09-24 DIAGNOSIS — T7840XA Allergy, unspecified, initial encounter: Secondary | ICD-10-CM | POA: Diagnosis not present

## 2013-09-24 DIAGNOSIS — R110 Nausea: Secondary | ICD-10-CM | POA: Diagnosis not present

## 2013-09-24 DIAGNOSIS — C3410 Malignant neoplasm of upper lobe, unspecified bronchus or lung: Secondary | ICD-10-CM

## 2013-09-24 NOTE — Progress Notes (Signed)
  Radiation Oncology         [phone] ________________________________  Name: [name] MRN: [id number]  Date: 09/24/2013  DOB: August 04, 1940  Stereotactic Body Radiotherapy Treatment Procedure Note  NARRATIVE:  [name] was brought to the stereotactic radiation treatment machine and placed supine on the CT couch. The patient was set up for stereotactic body radiotherapy on the body fix pillow.  3D TREATMENT PLANNING AND DOSIMETRY:  The patient's radiation plan was reviewed and approved prior to starting treatment.  It showed 3-dimensional radiation distributions overlaid onto the planning CT.  The [facility] for the target structures as well as the organs at risk were reviewed. The documentation of this is filed in the radiation oncology EMR.  SIMULATION VERIFICATION:  The patient underwent CT imaging on the treatment unit.  These were carefully aligned to document that the ablative radiation dose would cover the target volume and maximally spare the nearby organs at risk according to the planned distribution.  SPECIAL TREATMENT PROCEDURE: [name] received high dose ablative stereotactic body radiotherapy to the planned target volume without unforeseen complications. Treatment was delivered uneventfully. The high doses associated with stereotactic body radiotherapy and the significant potential risks require careful treatment set up and patient monitoring constituting a special treatment procedure   STEREOTACTIC TREATMENT MANAGEMENT:  Following delivery, the patient was evaluated clinically. The patient tolerated treatment without significant acute effects, and was discharged to home in stable condition.    PLAN: Continue treatment as planned.  _________________________   Thea Silversmith, MD

## 2013-09-25 ENCOUNTER — Ambulatory Visit
Admission: RE | Admit: 2013-09-25 | Discharge: 2013-09-25 | Disposition: A | Discharge status: Home / Self Care | Payer: Medicare Other | Source: Ambulatory Visit | Attending: Radiation Oncology | Admitting: Radiation Oncology

## 2013-09-25 ENCOUNTER — Ambulatory Visit: Payer: Medicare Other | Admitting: Radiation Oncology

## 2013-09-25 DIAGNOSIS — T7840XA Allergy, unspecified, initial encounter: Secondary | ICD-10-CM | POA: Diagnosis not present

## 2013-09-25 DIAGNOSIS — R110 Nausea: Secondary | ICD-10-CM | POA: Diagnosis not present

## 2013-09-25 DIAGNOSIS — C3410 Malignant neoplasm of upper lobe, unspecified bronchus or lung: Secondary | ICD-10-CM | POA: Diagnosis not present

## 2013-09-25 DIAGNOSIS — Z510 Encounter for antineoplastic radiation therapy: Secondary | ICD-10-CM | POA: Diagnosis not present

## 2013-09-26 ENCOUNTER — Ambulatory Visit
Admission: RE | Admit: 2013-09-26 | Discharge: 2013-09-26 | Disposition: A | Discharge status: Home / Self Care | Payer: Medicare Other | Source: Ambulatory Visit | Attending: Radiation Oncology | Admitting: Radiation Oncology

## 2013-09-26 ENCOUNTER — Encounter: Payer: Self-pay | Admitting: Radiation Oncology

## 2013-09-26 VITALS — BP 128/63 | HR 60 | Temp 97.9°F | Resp 20 | Wt 158.0 lb

## 2013-09-26 DIAGNOSIS — C3410 Malignant neoplasm of upper lobe, unspecified bronchus or lung: Secondary | ICD-10-CM

## 2013-09-26 DIAGNOSIS — R110 Nausea: Secondary | ICD-10-CM | POA: Diagnosis not present

## 2013-09-26 DIAGNOSIS — Z510 Encounter for antineoplastic radiation therapy: Secondary | ICD-10-CM | POA: Diagnosis not present

## 2013-09-26 DIAGNOSIS — T7840XA Allergy, unspecified, initial encounter: Secondary | ICD-10-CM | POA: Diagnosis not present

## 2013-09-26 NOTE — Progress Notes (Addendum)
Pt denies pain, loss of appetite, is slightly fatigued. She states her allergies "are driving her crazy". She has had nausea x 2, feels it may be due to nasal drainage on an empty stomach. She has dry cough, denies SOB. Pt denies skin irritation in her treatment area.

## 2013-09-26 NOTE — Progress Notes (Signed)
Weekly Management Note Current Dose: 40  Gy  Projected Dose: 50 Gy   Narrative:  The patient presents for routine under treatment assessment.  CBCT/MVCT images/Port film x-rays were reviewed.  The chart was checked. Some nausea nd allergies continue to bother her otherwise doing well.   Physical Findings: Weight: 158 lb (71.668 kg). Unchanged  Impression:  The patient is tolerating radiation.  Plan:  Continue treatment as planned. Follow up in 6 weeks with CT chest.

## 2013-09-27 ENCOUNTER — Ambulatory Visit
Admission: RE | Admit: 2013-09-27 | Discharge: 2013-09-27 | Disposition: A | Discharge status: Home / Self Care | Payer: Medicare Other | Source: Ambulatory Visit | Attending: Radiation Oncology | Admitting: Radiation Oncology

## 2013-09-27 ENCOUNTER — Telehealth: Payer: Self-pay | Admitting: *Deleted

## 2013-09-27 DIAGNOSIS — C3410 Malignant neoplasm of upper lobe, unspecified bronchus or lung: Secondary | ICD-10-CM | POA: Diagnosis not present

## 2013-09-27 DIAGNOSIS — Z510 Encounter for antineoplastic radiation therapy: Secondary | ICD-10-CM | POA: Diagnosis not present

## 2013-09-27 DIAGNOSIS — R110 Nausea: Secondary | ICD-10-CM | POA: Diagnosis not present

## 2013-09-27 DIAGNOSIS — T7840XA Allergy, unspecified, initial encounter: Secondary | ICD-10-CM | POA: Diagnosis not present

## 2013-09-27 NOTE — Telephone Encounter (Signed)
CALLED PATIENT TO INFORM OF TEST  AND FU , SPOKE WITH PATIENT AND SHE IS AWARE OF THESE APPTS.

## 2013-09-30 ENCOUNTER — Encounter: Payer: Self-pay | Admitting: Radiation Oncology

## 2013-09-30 ENCOUNTER — Other Ambulatory Visit: Payer: Medicare Other

## 2013-09-30 ENCOUNTER — Ambulatory Visit: Payer: Medicare Other

## 2013-09-30 ENCOUNTER — Ambulatory Visit
Admission: RE | Admit: 2013-09-30 | Discharge: 2013-09-30 | Disposition: A | Discharge status: Home / Self Care | Payer: Medicare Other | Source: Ambulatory Visit | Attending: Radiation Oncology | Admitting: Radiation Oncology

## 2013-09-30 DIAGNOSIS — C3410 Malignant neoplasm of upper lobe, unspecified bronchus or lung: Secondary | ICD-10-CM | POA: Diagnosis not present

## 2013-09-30 DIAGNOSIS — R110 Nausea: Secondary | ICD-10-CM | POA: Diagnosis not present

## 2013-09-30 DIAGNOSIS — Z510 Encounter for antineoplastic radiation therapy: Secondary | ICD-10-CM | POA: Diagnosis not present

## 2013-09-30 DIAGNOSIS — T7840XA Allergy, unspecified, initial encounter: Secondary | ICD-10-CM | POA: Diagnosis not present

## 2013-10-03 ENCOUNTER — Encounter: Payer: Self-pay | Admitting: Internal Medicine

## 2013-10-03 ENCOUNTER — Ambulatory Visit (HOSPITAL_BASED_OUTPATIENT_CLINIC_OR_DEPARTMENT_OTHER): Payer: Medicare Other | Admitting: Internal Medicine

## 2013-10-03 ENCOUNTER — Telehealth: Payer: Self-pay | Admitting: Internal Medicine

## 2013-10-03 VITALS — BP 146/63 | HR 69 | Temp 97.6°F | Resp 18 | Ht 65.0 in | Wt 155.3 lb

## 2013-10-03 DIAGNOSIS — R599 Enlarged lymph nodes, unspecified: Secondary | ICD-10-CM

## 2013-10-03 DIAGNOSIS — C3410 Malignant neoplasm of upper lobe, unspecified bronchus or lung: Secondary | ICD-10-CM | POA: Diagnosis not present

## 2013-10-03 NOTE — Progress Notes (Signed)
[place] [phone]   [phone]  OFFICE PROGRESS NOTE  [name], MD [address] [place] [address]  PRINCIPAL DIAGNOSES:  1. 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 The patient presented at that time with a right upper lobe lesion, as well as suspicious ground-glass opacities in the left lung. The tumor was negative for EGFR mutation and negative for ALK gene translocation. Veristrat test Good. 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 2. Stage IA malignant melanoma, status post wide excision by Dr. Allyson Sabal on Nov 12, 2008.  PRIOR THERAPY:  1) Status post right upper lobectomy with lymph node dissection under the care of Dr. Arlyce Dice on May 21, 2008.  2) Stereotactic radiotherapy to the left upper lobe lung nodule under the care of Dr. Pablo Ledger expected to be completed on 10/21/2011.  3) stereotactic radiotherapy to the left prevascular lymphadenopathy under the care of Dr. Pablo Ledger completed on 09/26/2013  CURRENT THERAPY:   INTERVAL HISTORY: [name] 73 y.o. female returns to the clinic today for follow up visit accompanied by her husband. The patient is feeling fine today with no specific complaints. She tolerated the previous course of stereotactic radiotherapy to the left prevascular lymph node fairly well with no significant adverse effects. The patient declined to proceed with a course of concurrent chemoradiation as was previously scheduled and prefered to proceed only with the stereotactic radiotherapy. She denied having any significant chest pain, shortness of breath, cough or hemoptysis. She denied having any significant weight loss or  night sweats. The patient denied having any fever or chills and no nausea or vomiting.   MEDICAL HISTORY: Past Medical History  Diagnosis Date  . LBBB (left bundle branch block)   . Hypertension   . Hyperthyroidism     dr Loanne Drilling- radioactive iodine treatment 2011- resolved  . Tremor     hand- intention tremor  . Complication of anesthesia     difficulty awakening, dysrhythmia post surgery  . lung ca dx'd 07/2008    lung- right non small cell, adenocarcinoma w/ broncho- alveolar features  . Melanoma     left leg  . Cervical ca dx'd 1988    surg only  . Radiation 10/11/11-10/21/11    Left upper lobe adenocarcinoma    ALLERGIES:  is allergic to meperidine hcl; morphine; penicillins; oxycodone-acetaminophen; propoxyphene n-acetaminophen; and tramadol hcl.  MEDICATIONS:  Current Outpatient Prescriptions  Medication Sig Dispense Refill  . ANGELIQ 0.5-1 MG per tablet TAKE 1 TABLET BY MOUTH EVERY DAY  28 tablet  11  . Aspirin-Salicylamide-Caffeine (BC HEADACHE POWDER PO) Take 1 packet by mouth 3 (three) times daily.       Marland Kitchen atenolol (TENORMIN) 25 MG tablet Take 25 mg by mouth 2 (two) times daily.      . calcium-vitamin D (OSCAL WITH D) 500-200 MG-UNIT per tablet Take 1 tablet by mouth daily.       . celecoxib (CELEBREX) 200 MG capsule Take 200 mg by mouth daily as needed. For pain      . diltiazem (DILACOR XR) 240 MG 24 hr capsule Take 240 mg by mouth daily.      . drospirenone-estradiol (ANGELIQ) 0.5-1 MG per tablet Take 1 tablet  by mouth daily.      Marland Kitchen esomeprazole (NEXIUM) 40 MG capsule Take 40 mg by mouth daily before breakfast.      . fexofenadine (ALLEGRA) 30 MG tablet Take 15 mg by mouth daily as needed (allergies).      . Homeopathic Products (ZICAM ALLERGY RELIEF NA) Place 1 spray into both nostrils 2 (two) times daily as needed (allergies).      . hydrochlorothiazide (HYDRODIURIL) 25 MG tablet Take 25 mg by mouth daily.      . Multiple Vitamin (MULITIVITAMIN WITH MINERALS) TABS  Take 1 tablet by mouth daily.      . Multiple Vitamins-Minerals (PRESERVISION AREDS 2 PO) Take 2 tablets by mouth daily.      . prochlorperazine (COMPAZINE) 10 MG tablet Take 1 tablet (10 mg total) by mouth every 6 (six) hours as needed for nausea or vomiting.  60 tablet  0  . pseudoephedrine (SUDAFED) 30 MG tablet Take 30 mg by mouth 2 (two) times daily. 1/2 tab in am and 1/2 tab in evening       No current facility-administered medications for this visit.    SURGICAL HISTORY:  Past Surgical History  Procedure Laterality Date  . Tonsillectomy    . Conization for cervical dysplasia    . Rul lobectomy  2010  . Esophogus stretching      REVIEW OF SYSTEMS:  Constitutional: negative Eyes: negative Ears, nose, mouth, throat, and face: negative Respiratory: negative Cardiovascular: negative Gastrointestinal: negative Genitourinary:negative Integument/breast: negative Hematologic/lymphatic: negative Musculoskeletal:negative Neurological: negative Behavioral/Psych: negative Endocrine: negative Allergic/Immunologic: negative   PHYSICAL EXAMINATION: General appearance: alert, cooperative and no distress Head: Normocephalic, without obvious abnormality, atraumatic Neck: no adenopathy Lymph nodes: Cervical, supraclavicular, and axillary nodes normal. Resp: clear to auscultation bilaterally Cardio: regular rate and rhythm, S1, S2 normal, no murmur, click, rub or gallop GI: soft, non-tender; bowel sounds normal; no masses,  no organomegaly Extremities: extremities normal, atraumatic, no cyanosis or edema Neurologic: Alert and oriented X 3, normal strength and tone. Normal symmetric reflexes. Normal coordination and gait  ECOG PERFORMANCE STATUS: 0 - Asymptomatic  Blood pressure 146/63, pulse 69, temperature 97.6 F (36.4 C), temperature source Oral, resp. rate 18, height '5\' 5"'  (1.651 m), weight 155 lb 4.8 oz (70.444 kg), SpO2 100.00%.  LABORATORY DATA: Lab Results  Component Value  Date   WBC 12.9* 08/16/2013   HGB 14.6 08/16/2013   HCT 42.8 08/16/2013   MCV 93.7 08/16/2013   PLT 206 08/16/2013      Chemistry      Component Value Date/Time   NA 140 08/16/2013 2138   NA 142 08/05/2013 0944   NA 137 01/16/2012 0941   K 3.4* 08/16/2013 2138   K 3.8 08/05/2013 0944   K 4.1 01/16/2012 0941   CL 99 08/16/2013 2138   CL 105 07/17/2012 1434   CL 100 01/16/2012 0941   CO2 24 08/16/2013 2138   CO2 27 08/05/2013 0944   CO2 28 01/16/2012 0941   BUN 23 08/16/2013 2138   BUN 26.4* 08/05/2013 0944   BUN 20 01/16/2012 0941   CREATININE 1.27* 08/16/2013 2138   CREATININE 1.4* 08/05/2013 0944   CREATININE 1.2 01/16/2012 0941      Component Value Date/Time   CALCIUM 9.4 08/16/2013 2138   CALCIUM 9.6 08/05/2013 0944   CALCIUM 9.1 01/16/2012 0941   ALKPHOS 55 08/05/2013 0944   ALKPHOS 46 01/16/2012 0941   ALKPHOS 50 10/18/2011 1415   AST 14 08/05/2013 0944   AST  23 01/16/2012 0941   AST 17 10/18/2011 1415   ALT 13 08/05/2013 0944   ALT 21 01/16/2012 0941   ALT 12 10/18/2011 1415   BILITOT 0.44 08/05/2013 0944   BILITOT 0.60 01/16/2012 0941   BILITOT 0.4 10/18/2011 1415       RADIOGRAPHIC STUDIES:  ASSESSMENT AND PLAN: this is a very pleasant 73 years old white female with recurrent non-small cell lung cancer, adenocarcinoma with conflicting reports regarding the EGFR mutation including the recent one from the biopsy of the left upper lobe lung nodule that showed mutation at exon 18. She was found on previous CT scan of the chest as well as PET scan to have an enlarging left prevascular lymphadenopathy and she recently underwent a course of stereotactic radiotherapy to this area. She is feeling fine today with no specific complaints. I hd another lengthy discussion with the patient and her husband about her treatment options. I gave her the option of observation and repeat CT scan of the chest in 6 weeks for reevaluation of her disease versus consideration of starting systemic therapy with oral Tarceva or systemic  chemotherapy either now or after the CT imaging. The patient and her husband prefers to wait until she had repeat CT scan of the chest which is very reasonable to have the baseline scan before starting any systemic therapy. I would see the patient back for follow up visit in 6 weeks for reevaluation and more detailed discussion of her treatment options. The patient was advised to call immediately if she has any concerning symptoms in the interval.  All questions were answered. The patient knows to call the clinic with any problems, questions or concerns. We can certainly see the patient much sooner if necessary. I spent 20 minutes of face-to-face counseling with the patient and her husband out to the total visit time of 30 minutes. Disclaimer: This note was dictated with voice recognition software. Similar sounding words can inadvertently be transcribed and may not be corrected upon review.

## 2013-10-03 NOTE — Telephone Encounter (Signed)
gv pt appt schedule for may. per pt she is not currently on any tx.

## 2013-10-06 NOTE — Progress Notes (Signed)
  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 The plan was prescribed to the 100% isodose line and a composite plan of her previous SBRT was created.   Narrative: The patient tolerated radiation treatment relatively well.      Plan: The patient has completed radiation treatment. The patient will return to radiation oncology clinic for routine followup in one month. I advised them to call or return sooner if they have any questions or concerns related to their recovery or treatment.  ------------------------------------------------  Thea Silversmith, MD

## 2013-10-07 ENCOUNTER — Ambulatory Visit: Payer: Medicare Other

## 2013-10-07 ENCOUNTER — Other Ambulatory Visit: Payer: Medicare Other

## 2013-10-07 ENCOUNTER — Telehealth: Payer: Self-pay | Admitting: Internal Medicine

## 2013-10-07 NOTE — Telephone Encounter (Signed)
PT CALLED TO R/S APPT DUE TO OTHER OBLIGATIONS....DONE....Marland KitchenPT AWARE OF NEW D.T

## 2013-10-14 ENCOUNTER — Other Ambulatory Visit: Payer: Medicare Other

## 2013-10-16 NOTE — Progress Notes (Signed)
  Radiation Oncology         [phone] ________________________________  Name: [name] MRN: [id number]  Date: 09/16/2013  DOB: December 14, 1940  Simulation Verification Note  Status: outpatient  NARRATIVE: The patient was brought to the treatment unit and placed in the planned treatment position. The clinical setup was verified. Then port films were obtained and uploaded to the radiation oncology medical record software.  The treatment beams were carefully compared against the planned radiation fields. The position location and shape of the radiation fields was reviewed. The targeted volume of tissue appears appropriately covered by the radiation beams. Organs at risk appear to be excluded as planned.    ------------------------------------------------  Thea Silversmith, MD

## 2013-10-23 ENCOUNTER — Other Ambulatory Visit: Payer: Self-pay | Admitting: Cardiology

## 2013-11-05 ENCOUNTER — Encounter: Payer: Self-pay | Admitting: Radiation Oncology

## 2013-11-07 ENCOUNTER — Encounter (HOSPITAL_COMMUNITY): Payer: Self-pay

## 2013-11-07 ENCOUNTER — Ambulatory Visit (HOSPITAL_COMMUNITY)
Admission: RE | Admit: 2013-11-07 | Discharge: 2013-11-07 | Disposition: A | Discharge status: Home / Self Care | Payer: Medicare Other | Source: Ambulatory Visit | Attending: Radiation Oncology | Admitting: Radiation Oncology

## 2013-11-07 ENCOUNTER — Ambulatory Visit
Admission: RE | Admit: 2013-11-07 | Discharge: 2013-11-07 | Disposition: A | Discharge status: Home / Self Care | Payer: Medicare Other | Source: Ambulatory Visit | Attending: Radiation Oncology | Admitting: Radiation Oncology

## 2013-11-07 DIAGNOSIS — C3410 Malignant neoplasm of upper lobe, unspecified bronchus or lung: Secondary | ICD-10-CM

## 2013-11-07 DIAGNOSIS — Z85118 Personal history of other malignant neoplasm of bronchus and lung: Secondary | ICD-10-CM | POA: Diagnosis not present

## 2013-11-07 DIAGNOSIS — J984 Other disorders of lung: Secondary | ICD-10-CM | POA: Diagnosis not present

## 2013-11-07 DIAGNOSIS — C3490 Malignant neoplasm of unspecified part of unspecified bronchus or lung: Secondary | ICD-10-CM | POA: Insufficient documentation

## 2013-11-07 LAB — BUN AND CREATININE (CC13)
BUN: 19.7 mg/dL (ref 7.0–26.0)
Creatinine: 1.3 mg/dL — ABNORMAL HIGH (ref 0.6–1.1)

## 2013-11-07 MED ORDER — IOHEXOL 300 MG/ML  SOLN
80.0000 mL | Freq: Once | INTRAMUSCULAR | Status: AC | PRN
  Administered 2013-11-07: 80 mL via INTRAVENOUS

## 2013-11-08 ENCOUNTER — Ambulatory Visit
Admission: RE | Admit: 2013-11-08 | Discharge: 2013-11-08 | Disposition: A | Discharge status: Home / Self Care | Payer: Medicare Other | Source: Ambulatory Visit | Attending: Radiation Oncology | Admitting: Radiation Oncology

## 2013-11-08 VITALS — BP 148/59 | HR 70 | Temp 97.9°F | Wt 158.3 lb

## 2013-11-08 DIAGNOSIS — C3410 Malignant neoplasm of upper lobe, unspecified bronchus or lung: Secondary | ICD-10-CM

## 2013-11-08 HISTORY — DX: Personal history of irradiation: Z92.3

## 2013-11-08 NOTE — Progress Notes (Signed)
Patient for follow up completion of radiation on 09/30/13 to prevascular lymph node 50 Gy in 10 fractions.Ct of chest completed on 11/07/13 reveals slight interval enlargement of an 8 x 7 mm nodule in left upper lobe.  of lung, otherwise positive response to therapy.Chronic back pain otherwise doing well.Denies shortness of breath of cough.

## 2013-11-08 NOTE — Progress Notes (Signed)
Department of Radiation Oncology  Phone:  [phone] Fax:        [phone]   Name: [name] MRN: [id number]  DOB: 22-Nov-1940  Date: 11/08/2013  Follow Up Visit Note  Diagnosis: Multifocal bronchoalveolar carcinoma  Summary and Interval since last radiation: SBRT to a left upper lobe nodule to a total dose of 60 gray in 5 fractions completed April 2013  Interval History: [name] presents today for routine followup.  She tolerated radiation well. She is having more back pain related to her scoliosis. She also had an episode last week of her esophageal dysmotility. She had a CT of the chest on 11/07/2013 percent for those results. The treated has decreased in size. There is a question of a 1 mm increase in the left upper lobe nodule in all of her other areas were stable. She has followup scheduled with Dr. Julien Nordmann next week. They're going to the treatment with Tarceva. She requested copies of her films and I printed out copies of her scout images from the CAT scans at her request so that she could show them to her chiropractor.  Allergies:  Allergies  Allergen Reactions  . Meperidine Hcl Other (See Comments)    "knocked pt out for 4 days"  . Morphine Other (See Comments)    Stopped breathing  . Penicillins Other (See Comments)    Difficulty breathing  . Oxycodone-Acetaminophen Itching       . Propoxyphene N-Acetaminophen Nausea And Vomiting  . Tramadol Hcl Nausea And Vomiting    Medications:  Current Outpatient Prescriptions  Medication Sig Dispense Refill  . ANGELIQ 0.5-1 MG per tablet TAKE 1 TABLET BY MOUTH EVERY DAY  28 tablet  11  . Aspirin-Salicylamide-Caffeine (BC HEADACHE POWDER PO) Take 1 packet by mouth 3 (three) times daily.       Marland Kitchen atenolol (TENORMIN) 25 MG tablet Take 25 mg by mouth 2 (two) times daily.      Marland Kitchen atenolol (TENORMIN) 25 MG tablet TAKE 1 TABLET BY MOUTH TWICE DAILY  180 tablet  0  . calcium-vitamin D (OSCAL WITH D) 500-200 MG-UNIT per tablet  Take 1 tablet by mouth daily.       . celecoxib (CELEBREX) 200 MG capsule Take 200 mg by mouth daily as needed. For pain      . diltiazem (DILACOR XR) 240 MG 24 hr capsule Take 240 mg by mouth daily.      Marland Kitchen esomeprazole (NEXIUM) 40 MG capsule Take 40 mg by mouth daily before breakfast.      . fexofenadine (ALLEGRA) 30 MG tablet Take 15 mg by mouth daily as needed (allergies).      . fluticasone (FLONASE) 50 MCG/ACT nasal spray Place into both nostrils daily.      . Homeopathic Products (ZICAM ALLERGY RELIEF NA) Place 1 spray into both nostrils 2 (two) times daily as needed (allergies).      . hydrochlorothiazide (HYDRODIURIL) 25 MG tablet Take 25 mg by mouth daily.      . Multiple Vitamin (MULITIVITAMIN WITH MINERALS) TABS Take 1 tablet by mouth daily.      . Multiple Vitamins-Minerals (PRESERVISION AREDS 2 PO) Take 2 tablets by mouth daily.      . prochlorperazine (COMPAZINE) 10 MG tablet Take 1 tablet (10 mg total) by mouth every 6 (six) hours as needed for nausea or vomiting.  60 tablet  0  . pseudoephedrine (SUDAFED) 30 MG tablet Take 30 mg by mouth 2 (two) times daily. 1/2 tab in  am and 1/2 tab in evening       No current facility-administered medications for this encounter.    Physical Exam:  Filed Vitals:   11/08/13 1501  BP: 148/59  Pulse: 70  Temp: 97.9 F (36.6 C)  Weight: 158 lb 4.8 oz (71.804 kg)  SpO2: 100%   pleasant female in no distress sitting comfortably on examining table. She appears younger than her stated age.  IMPRESSION: [name] is a 73 y.o. female status post SBRT to the left upper lobe and status post right upper lobectomy in January 2010, most recently status post SBRT to a sentinel lymph node with excellent wants to therapy  PLAN: She is responded really well. The rest of her disease is essentially stable. And we'll hold off on radiation until absolutely necessary. Were starting to read the end of her normal lungs are in terms of repeat SBRT. Probably have  another 1-2 SBRT courses left before we reach maximal lung dose. She will followup with Dr. Julien Nordmann and I will see her back as necessary.  Thea Silversmith, MD

## 2013-11-13 DIAGNOSIS — J069 Acute upper respiratory infection, unspecified: Secondary | ICD-10-CM | POA: Diagnosis not present

## 2013-11-14 ENCOUNTER — Other Ambulatory Visit: Payer: Medicare Other

## 2013-11-14 ENCOUNTER — Ambulatory Visit: Payer: Medicare Other | Admitting: Internal Medicine

## 2013-11-18 ENCOUNTER — Ambulatory Visit (HOSPITAL_BASED_OUTPATIENT_CLINIC_OR_DEPARTMENT_OTHER): Payer: Medicare Other | Admitting: Internal Medicine

## 2013-11-18 ENCOUNTER — Telehealth: Payer: Self-pay | Admitting: Internal Medicine

## 2013-11-18 ENCOUNTER — Encounter: Payer: Self-pay | Admitting: Internal Medicine

## 2013-11-18 ENCOUNTER — Other Ambulatory Visit (HOSPITAL_BASED_OUTPATIENT_CLINIC_OR_DEPARTMENT_OTHER): Payer: Medicare Other

## 2013-11-18 VITALS — BP 184/81 | HR 57 | Temp 98.0°F | Resp 18 | Ht 65.0 in | Wt 154.9 lb

## 2013-11-18 DIAGNOSIS — C3410 Malignant neoplasm of upper lobe, unspecified bronchus or lung: Secondary | ICD-10-CM

## 2013-11-18 LAB — CBC WITH DIFFERENTIAL/PLATELET
BASO%: 0.2 % (ref 0.0–2.0)
BASOS ABS: 0 10*3/uL (ref 0.0–0.1)
EOS ABS: 0 10*3/uL (ref 0.0–0.5)
EOS%: 0.4 % (ref 0.0–7.0)
HEMATOCRIT: 45.5 % (ref 34.8–46.6)
HEMOGLOBIN: 15.2 g/dL (ref 11.6–15.9)
LYMPH%: 15.1 % (ref 14.0–49.7)
MCH: 31.3 pg (ref 25.1–34.0)
MCHC: 33.4 g/dL (ref 31.5–36.0)
MCV: 93.6 fL (ref 79.5–101.0)
MONO#: 2.3 10*3/uL — AB (ref 0.1–0.9)
MONO%: 17.1 % — ABNORMAL HIGH (ref 0.0–14.0)
NEUT%: 67.2 % (ref 38.4–76.8)
NEUTROS ABS: 9.1 10*3/uL — AB (ref 1.5–6.5)
PLATELETS: 321 10*3/uL (ref 145–400)
RBC: 4.86 10*6/uL (ref 3.70–5.45)
RDW: 13.5 % (ref 11.2–14.5)
WBC: 13.5 10*3/uL — AB (ref 3.9–10.3)
lymph#: 2 10*3/uL (ref 0.9–3.3)

## 2013-11-18 LAB — TECHNOLOGIST REVIEW: Technologist Review: 3

## 2013-11-18 LAB — COMPREHENSIVE METABOLIC PANEL (CC13)
ALT: 18 U/L (ref 0–55)
ANION GAP: 10 meq/L (ref 3–11)
AST: 17 U/L (ref 5–34)
Albumin: 3.4 g/dL — ABNORMAL LOW (ref 3.5–5.0)
Alkaline Phosphatase: 56 U/L (ref 40–150)
BILIRUBIN TOTAL: 0.32 mg/dL (ref 0.20–1.20)
BUN: 33.5 mg/dL — ABNORMAL HIGH (ref 7.0–26.0)
CO2: 27 meq/L (ref 22–29)
Calcium: 10 mg/dL (ref 8.4–10.4)
Chloride: 101 mEq/L (ref 98–109)
Creatinine: 1.6 mg/dL — ABNORMAL HIGH (ref 0.6–1.1)
GLUCOSE: 92 mg/dL (ref 70–140)
Potassium: 3.9 mEq/L (ref 3.5–5.1)
SODIUM: 138 meq/L (ref 136–145)
TOTAL PROTEIN: 6.6 g/dL (ref 6.4–8.3)

## 2013-11-18 MED ORDER — ERLOTINIB HCL 150 MG PO TABS: 150.0000 mg | ORAL_TABLET | Freq: Every day | ORAL | Status: DC

## 2013-11-18 NOTE — Progress Notes (Signed)
[place] [phone]   [phone]  OFFICE PROGRESS NOTE  [name], MD [address] [place] [address]  PRINCIPAL DIAGNOSES:  1. 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 The patient presented at that time with a right upper lobe lesion, as well as suspicious ground-glass opacities in the left lung. The tumor was negative for EGFR mutation and negative for ALK gene translocation. Veristrat test Good. EGFR mutation studies were conflicting, initial test was negative, second test was positive for EGFR mutation in exon 18 and the the test from [facility] one was negative with positive KRAS mutation in Exon 12. 2. Stage IA malignant melanoma, status post wide excision by Dr. Allyson Sabal on Nov 12, 2008.  PRIOR THERAPY:  1) Status post right upper lobectomy with lymph node dissection under the care of Dr. Arlyce Dice on May 21, 2008.  2) Stereotactic radiotherapy to the left upper lobe lung nodule under the care of Dr. Pablo Ledger expected to be completed on 10/21/2011.  3) stereotactic radiotherapy to the left prevascular lymphadenopathy under the care of Dr. Pablo Ledger completed on 09/26/2013  CURRENT THERAPY:   INTERVAL HISTORY: [name] 73 y.o. female returns to the clinic today for follow up visit accompanied by her husband. She is complaining of chest congestion and cough started last week consistent with flulike symptoms. She has mild shortness of breath. The patient denied having any fever or chills. She has no significant weight loss or night sweats. She has no nausea or vomiting. She denied having any significant chest pain or hemoptysis. She had repeat CT scan of the chest performed recently and she is here for evaluation and discussion of her scan results.  MEDICAL HISTORY: Past Medical History  Diagnosis Date  . LBBB  (left bundle branch block)   . Hypertension   . Hyperthyroidism     dr Loanne Drilling- radioactive iodine treatment 2011- resolved  . Tremor     hand- intention tremor  . Complication of anesthesia     difficulty awakening, dysrhythmia post surgery  . lung ca dx'd 07/2008    lung- right non small cell, adenocarcinoma w/ broncho- alveolar features  . Melanoma     left leg  . Cervical ca dx'd 1988    surg only  . Radiation 10/11/11-10/21/11    Left upper lobe adenocarcinoma  . Hx of radiation therapy 09/16/13-09/30/13    prevascular lymph node    ALLERGIES:  is allergic to meperidine hcl; morphine; penicillins; oxycodone-acetaminophen; propoxyphene n-acetaminophen; and tramadol hcl.  MEDICATIONS:  Current Outpatient Prescriptions  Medication Sig Dispense Refill  . Aspirin-Salicylamide-Caffeine (BC HEADACHE POWDER PO) Take 1 packet by mouth 3 (three) times daily.       Marland Kitchen atenolol (TENORMIN) 25 MG tablet TAKE 1 TABLET BY MOUTH TWICE DAILY  180 tablet  0  . brompheniramine-pseudoephedrine-DM 30-2-10 MG/5ML syrup Take 2.5 mLs by mouth 4 (four) times daily as needed.      . calcium-vitamin D (OSCAL WITH D) 500-200 MG-UNIT per tablet Take 1 tablet by mouth daily.       . celecoxib (CELEBREX) 200 MG capsule Take 200 mg by mouth daily as needed. For pain      . diltiazem (DILACOR XR) 240 MG 24 hr capsule Take 240 mg by mouth daily.      Marland Kitchen esomeprazole (NEXIUM) 40 MG capsule Take 40 mg by mouth daily before  breakfast.      . fluticasone (FLONASE) 50 MCG/ACT nasal spray Place into both nostrils daily.      . hydrochlorothiazide (HYDRODIURIL) 25 MG tablet Take 25 mg by mouth daily.      . Multiple Vitamin (MULITIVITAMIN WITH MINERALS) TABS Take 1 tablet by mouth daily.      . Multiple Vitamins-Minerals (PRESERVISION AREDS 2 PO) Take 2 tablets by mouth daily.      . predniSONE (STERAPRED UNI-PAK) 5 MG TABS tablet       . ANGELIQ 0.5-1 MG per tablet TAKE 1 TABLET BY MOUTH EVERY DAY  28 tablet  11  .  atenolol (TENORMIN) 25 MG tablet Take 25 mg by mouth 2 (two) times daily.      Marland Kitchen doxycycline (VIBRA-TABS) 100 MG tablet Take 100 mg by mouth 2 (two) times daily.      . fexofenadine (ALLEGRA) 30 MG tablet Take 15 mg by mouth daily as needed (allergies).      . Homeopathic Products (ZICAM ALLERGY RELIEF NA) Place 1 spray into both nostrils 2 (two) times daily as needed (allergies).      . prochlorperazine (COMPAZINE) 10 MG tablet Take 1 tablet (10 mg total) by mouth every 6 (six) hours as needed for nausea or vomiting.  60 tablet  0  . pseudoephedrine (SUDAFED) 30 MG tablet Take 30 mg by mouth 2 (two) times daily. 1/2 tab in am and 1/2 tab in evening       No current facility-administered medications for this visit.    SURGICAL HISTORY:  Past Surgical History  Procedure Laterality Date  . Tonsillectomy    . Conization for cervical dysplasia    . Rul lobectomy  2010  . Esophogus stretching      REVIEW OF SYSTEMS:  Constitutional: positive for fatigue Eyes: negative Ears, nose, mouth, throat, and face: negative Respiratory: positive for cough, dyspnea on exertion and sputum Cardiovascular: negative Gastrointestinal: negative Genitourinary:negative Integument/breast: negative Hematologic/lymphatic: negative Musculoskeletal:negative Neurological: negative Behavioral/Psych: negative Endocrine: negative Allergic/Immunologic: negative   PHYSICAL EXAMINATION: General appearance: alert, cooperative and no distress Head: Normocephalic, without obvious abnormality, atraumatic Neck: no adenopathy Lymph nodes: Cervical, supraclavicular, and axillary nodes normal. Resp: clear to auscultation bilaterally Cardio: regular rate and rhythm, S1, S2 normal, no murmur, click, rub or gallop GI: soft, non-tender; bowel sounds normal; no masses,  no organomegaly Extremities: extremities normal, atraumatic, no cyanosis or edema Neurologic: Alert and oriented X 3, normal strength and tone. Normal  symmetric reflexes. Normal coordination and gait  ECOG PERFORMANCE STATUS: 1 - Symptomatic but completely ambulatory  Blood pressure 184/81, pulse 57, temperature 98 F (36.7 C), temperature source Oral, resp. rate 18, height _0  (1.651 m), weight 154 lb 14.4 oz (70.262 kg), SpO2 98.00%.  LABORATORY DATA: Lab Results  Component Value Date   WBC 13.5* 11/18/2013   HGB 15.2 11/18/2013   HCT 45.5 11/18/2013   MCV 93.6 11/18/2013   PLT 321 11/18/2013      Chemistry      Component Value Date/Time   NA 140 08/16/2013 2138   NA 142 08/05/2013 0944   NA 137 01/16/2012 0941   K 3.4* 08/16/2013 2138   K 3.8 08/05/2013 0944   K 4.1 01/16/2012 0941   CL 99 08/16/2013 2138   CL 105 07/17/2012 1434   CL 100 01/16/2012 0941   CO2 24 08/16/2013 2138   CO2 27 08/05/2013 0944   CO2 28 01/16/2012 0941   BUN 19.7 11/07/2013 1102  BUN 23 08/16/2013 2138   BUN 20 01/16/2012 0941   CREATININE 1.3* 11/07/2013 1102   CREATININE 1.27* 08/16/2013 2138   CREATININE 1.2 01/16/2012 0941      Component Value Date/Time   CALCIUM 9.4 08/16/2013 2138   CALCIUM 9.6 08/05/2013 0944   CALCIUM 9.1 01/16/2012 0941   ALKPHOS 55 08/05/2013 0944   ALKPHOS 46 01/16/2012 0941   ALKPHOS 50 10/18/2011 1415   AST 14 08/05/2013 0944   AST 23 01/16/2012 0941   AST 17 10/18/2011 1415   ALT 13 08/05/2013 0944   ALT 21 01/16/2012 0941   ALT 12 10/18/2011 1415   BILITOT 0.44 08/05/2013 0944   BILITOT 0.60 01/16/2012 0941   BILITOT 0.4 10/18/2011 1415       RADIOGRAPHIC STUDIES: Ct Chest W Contrast  11/07/2013   CLINICAL DATA:  History of lung cancer (adenocarcinoma) diagnosed in 2010 status post right upper lobectomy. Additionally, patient has had stereotactic radiotherapy to the left upper lobe completed in April 2013, and stereotactic radiotherapy to left prevascular lymphadenopathy completed in March 2015.  EXAM: CT CHEST WITH CONTRAST  TECHNIQUE: Multidetector CT imaging of the chest was performed during intravenous contrast administration.  CONTRAST:  36m  OMNIPAQUE IOHEXOL 300 MG/ML  SOLN  COMPARISON:  Multiple priors, most recently PET-CT 08/21/2013.  FINDINGS: Mediastinum: Previously noted AP window lymph node has decreased in size, currently measuring 15 x 8 mm (image 22 of series 2), as compared with 18 x 12 mm on prior study 08/05/2013. No other definite pathologically enlarged mediastinal or hilar lymph nodes are noted at this time. Heart size is normal. There is no significant pericardial fluid, thickening or pericardial calcification. There is atherosclerosis of the thoracic aorta, the great vessels of the mediastinum and the coronary arteries, including calcified atherosclerotic plaque in the left anterior descending coronary arteries. Moderate size hiatal hernia.  Lungs/Pleura: Postoperative changes of right upper lobectomy and evolving postradiation changes in the apex of the left upper lobe are again noted. There are several nodular foci of ground-glass attenuation seen scattered throughout the lungs bilaterally, the largest of which are both present on image 25 of series 5 in the left upper lobe measuring 19 x 11 mm, and in the left lower lobe measuring 15 x 21 mm. Neither of these lesions demonstrates a central solid component at this time, and these are very similar in size compared to prior study 08/05/2013. However, there is an enlarging solid nodule in the left upper lobe just lateral to the area of probable radiation fibrosis, currently measuring 8 x 7 mm (image 15 of series 5), previously only 5-6 mm on prior study 08/05/2013. No acute consolidative airspace disease. No pleural effusions.  Upper Abdomen: Sub cm low-attenuation lesion in the upper pole of the left kidney is too small to definitively characterize, but statistically likely a small cyst. There is also an exophytic 2.4 cm simple cyst in the upper pole of the right kidney. Scarring in the left kidney is again noted. Atherosclerosis.  Musculoskeletal: There are no aggressive appearing  lytic or blastic lesions noted in the visualized portions of the skeleton.    IMPRESSION: 1. Slight interval enlargement of an 8 x 7 mm nodule in the left upper lobe. This is in close proximity to the area of apparent evolving postradiation fibrosis, and could be part of this underlying process, however, continued attention on future followup examinations is recommended to ensure the stability or regression of this lesion, as recurrent disease in this  area could have a similar appearance. Since this lesion is likely below the resolution of PET-CT, a repeat noncontrast chest CT in 3 months is suggested at this time. 2. Other previously noted multifocal ground-glass attenuation nodules appear similar to prior studies, as above. 3. Positive response to therapy with decreased size of AP window lymph node, which currently measures only 15 x 8 mm. 4. Additional incidental findings, as above.   Electronically Signed   By: Vinnie Langton M.D.   On: 11/07/2013 14:39   ASSESSMENT AND PLAN: this is a very pleasant 73 years old white female with recurrent non-small cell lung cancer, adenocarcinoma with conflicting reports regarding the EGFR mutation including the recent one from the biopsy of the left upper lobe lung nodule that showed mutation at exon 18. She recently completed a course of stereotactic radiotherapy to the left prevascular lymphadenopathy under the care of Dr. Pablo Ledger. Repeat CT scan of the chest today showed slight interval enlargement of a nodule in the left upper lobe but there was also decrease in the size of the left prevascular lymph node in addition to the stable opacities in the lungs bilaterally. I discussed the scan results and showed the images to the patient and her husband. I gave her the option of treatment with Tarceva 150 mg by mouth daily versus continuous observation and repeat CT scan of the chest in 3 months. The patient is now more interested in proceeding with treatment. I  discussed with her the adverse effect of Tarceva including but not limited to skin rash, diarrhea, interstitial lung disease, dry skin, and liver or renal dysfunction. She was getting a handout and a starter kit for Tarceva. I will arrange for the patient to have a chemotherapy therapy education class before starting the first dose of her treatment. She signed the consent to start with the oral medication. She is expected to start treatment with Tarceva in 7-10 days after improvement of her cold symptoms. I would see her back for followup visit in 3 weeks for reevaluation and management any adverse effect of her treatment. She was given advice on how to deal with a skin rash or diarrhea and when it happens. The patient was advised to call immediately if she has any concerning symptoms in the interval.  All questions were answered. The patient knows to call the clinic with any problems, questions or concerns. We can certainly see the patient much sooner if necessary. I spent 15 minutes of face-to-face counseling with the patient and her husband out to the total visit time of 25 minutes.  Disclaimer: This note was dictated with voice recognition software. Similar sounding words can inadvertently be transcribed and may not be corrected upon review.

## 2013-11-18 NOTE — Progress Notes (Signed)
Faxed tarceva pa form to [facility]

## 2013-11-18 NOTE — Telephone Encounter (Signed)
gv pt appt schedule for may/june

## 2013-11-20 ENCOUNTER — Encounter: Payer: Self-pay | Admitting: Internal Medicine

## 2013-11-20 NOTE — Progress Notes (Signed)
[facility], [id number], approved tarceva from 11/19/13-02/18/14

## 2013-11-21 ENCOUNTER — Other Ambulatory Visit: Payer: Medicare Other

## 2013-11-21 ENCOUNTER — Encounter: Payer: Self-pay | Admitting: *Deleted

## 2013-12-11 ENCOUNTER — Encounter: Payer: Self-pay | Admitting: Medical Oncology

## 2013-12-13 ENCOUNTER — Encounter: Payer: Self-pay | Admitting: Internal Medicine

## 2013-12-13 ENCOUNTER — Ambulatory Visit (HOSPITAL_BASED_OUTPATIENT_CLINIC_OR_DEPARTMENT_OTHER): Payer: Medicare Other | Admitting: Internal Medicine

## 2013-12-13 ENCOUNTER — Telehealth: Payer: Self-pay | Admitting: Internal Medicine

## 2013-12-13 ENCOUNTER — Other Ambulatory Visit (HOSPITAL_BASED_OUTPATIENT_CLINIC_OR_DEPARTMENT_OTHER): Payer: Medicare Other

## 2013-12-13 VITALS — BP 177/65 | HR 67 | Temp 98.5°F | Resp 18 | Ht 65.0 in | Wt 149.8 lb

## 2013-12-13 DIAGNOSIS — C3410 Malignant neoplasm of upper lobe, unspecified bronchus or lung: Secondary | ICD-10-CM

## 2013-12-13 LAB — COMPREHENSIVE METABOLIC PANEL (CC13)
ALT: 17 U/L (ref 0–55)
ANION GAP: 14 meq/L — AB (ref 3–11)
AST: 19 U/L (ref 5–34)
Albumin: 3.3 g/dL — ABNORMAL LOW (ref 3.5–5.0)
Alkaline Phosphatase: 61 U/L (ref 40–150)
BUN: 17.6 mg/dL (ref 7.0–26.0)
CHLORIDE: 104 meq/L (ref 98–109)
CO2: 24 mEq/L (ref 22–29)
Calcium: 9.6 mg/dL (ref 8.4–10.4)
Creatinine: 1.6 mg/dL — ABNORMAL HIGH (ref 0.6–1.1)
GLUCOSE: 103 mg/dL (ref 70–140)
Potassium: 3.7 mEq/L (ref 3.5–5.1)
Sodium: 141 mEq/L (ref 136–145)
Total Bilirubin: 1 mg/dL (ref 0.20–1.20)
Total Protein: 6.3 g/dL — ABNORMAL LOW (ref 6.4–8.3)

## 2013-12-13 LAB — CBC WITH DIFFERENTIAL/PLATELET
BASO%: 0.6 % (ref 0.0–2.0)
Basophils Absolute: 0.1 10*3/uL (ref 0.0–0.1)
EOS ABS: 0.9 10*3/uL — AB (ref 0.0–0.5)
EOS%: 9.3 % — ABNORMAL HIGH (ref 0.0–7.0)
HCT: 42.6 % (ref 34.8–46.6)
HGB: 14.3 g/dL (ref 11.6–15.9)
LYMPH#: 1.5 10*3/uL (ref 0.9–3.3)
LYMPH%: 15.2 % (ref 14.0–49.7)
MCH: 31.4 pg (ref 25.1–34.0)
MCHC: 33.6 g/dL (ref 31.5–36.0)
MCV: 93.4 fL (ref 79.5–101.0)
MONO#: 1.5 10*3/uL — AB (ref 0.1–0.9)
MONO%: 15.9 % — ABNORMAL HIGH (ref 0.0–14.0)
NEUT%: 59 % (ref 38.4–76.8)
NEUTROS ABS: 5.7 10*3/uL (ref 1.5–6.5)
PLATELETS: 266 10*3/uL (ref 145–400)
RBC: 4.56 10*6/uL (ref 3.70–5.45)
RDW: 14.1 % (ref 11.2–14.5)
WBC: 9.7 10*3/uL (ref 3.9–10.3)

## 2013-12-13 NOTE — Telephone Encounter (Signed)
gv adn pritned appt sched and avs for pt for June

## 2013-12-13 NOTE — Progress Notes (Signed)
[place] Telephone:([phone]   [phone]) [phone]  OFFICE PROGRESS NOTE  [name], MD [address] [place] [address]  PRINCIPAL DIAGNOSES:  1. 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 The patient presented at that time with a right upper lobe lesion, as well as suspicious ground-glass opacities in the left lung. The tumor was negative for EGFR mutation and negative for ALK gene translocation. Veristrat test Good. EGFR mutation studies were conflicting, initial test was negative, second test was positive for EGFR mutation in exon 18 and the the test from [facility] one was negative with positive KRAS mutation in Exon 12. 2. Stage IA malignant melanoma, status post wide excision by Dr. Allyson Sabal on Nov 12, 2008.  PRIOR THERAPY:  1) Status post right upper lobectomy with lymph node dissection under the care of Dr. Arlyce Dice on May 21, 2008.  2) Stereotactic radiotherapy to the left upper lobe lung nodule under the care of Dr. Pablo Ledger expected to be completed on 10/21/2011.  3) stereotactic radiotherapy to the left prevascular lymphadenopathy under the care of Dr. Pablo Ledger completed on 09/26/2013  CURRENT THERAPY: Tarceva 150 mg by mouth daily started on 11/29/2013.  INTERVAL HISTORY: [name] 73 y.o. female returns to the clinic today for follow up visit accompanied by her husband. She started Tarceva 2 weeks ago and tolerating it fairly well except for few episodes of diarrhea. She took Imodium as prescribed for her diarrhea but this was complicated with constipation for several days. She is now adjusting her dose of Imodium as needed. She has very mild skin rash on the face. She is tolerating her treatment with Tarceva well except for mild fatigue, lack of taste and few pounds of weight loss. The patient denied having any fever or  chills. She has no nausea or vomiting. She denied having any significant chest pain or hemoptysis.   MEDICAL HISTORY: Past Medical History  Diagnosis Date  . LBBB (left bundle branch block)   . Hypertension   . Hyperthyroidism     dr Loanne Drilling- radioactive iodine treatment 2011- resolved  . Tremor     hand- intention tremor  . Complication of anesthesia     difficulty awakening, dysrhythmia post surgery  . lung ca dx'd 07/2008    lung- right non small cell, adenocarcinoma w/ broncho- alveolar features  . Melanoma     left leg  . Cervical ca dx'd 1988    surg only  . Radiation 10/11/11-10/21/11    Left upper lobe adenocarcinoma  . Hx of radiation therapy 09/16/13-09/30/13    prevascular lymph node    ALLERGIES:  is allergic to meperidine hcl; morphine; penicillins; oxycodone-acetaminophen; propoxyphene n-acetaminophen; and tramadol hcl.  MEDICATIONS:  Current Outpatient Prescriptions  Medication Sig Dispense Refill  . ANGELIQ 0.5-1 MG per tablet TAKE 1 TABLET BY MOUTH EVERY DAY  28 tablet  11  . Aspirin-Salicylamide-Caffeine (BC HEADACHE POWDER PO) Take 1 packet by mouth 3 (three) times daily.       Marland Kitchen atenolol (TENORMIN) 25 MG tablet TAKE 1 TABLET BY MOUTH TWICE DAILY  180 tablet  0  . brompheniramine-pseudoephedrine-DM 30-2-10 MG/5ML syrup Take 2.5 mLs by mouth 4 (four) times daily as needed.      . calcium-vitamin D (OSCAL WITH D) 500-200 MG-UNIT per tablet Take 1 tablet by mouth daily.       . celecoxib (CELEBREX) 200  MG capsule Take 200 mg by mouth daily as needed. For pain      . diltiazem (DILACOR XR) 240 MG 24 hr capsule Take 240 mg by mouth daily.      Marland Kitchen erlotinib (TARCEVA) 150 MG tablet Take 1 tablet (150 mg total) by mouth daily. Take on an empty stomach 1 hour before meals or 2 hours after.  30 tablet  2  . fexofenadine (ALLEGRA) 30 MG tablet Take 15 mg by mouth daily as needed (allergies).      . fluticasone (FLONASE) 50 MCG/ACT nasal spray Place into both nostrils daily.       . Homeopathic Products (ZICAM ALLERGY RELIEF NA) Place 1 spray into both nostrils 2 (two) times daily as needed (allergies).      . hydrochlorothiazide (HYDRODIURIL) 25 MG tablet Take 25 mg by mouth daily.      . Multiple Vitamin (MULITIVITAMIN WITH MINERALS) TABS Take 1 tablet by mouth daily.      . Multiple Vitamins-Minerals (PRESERVISION AREDS 2 PO) Take 2 tablets by mouth daily.      . predniSONE (STERAPRED UNI-PAK) 5 MG TABS tablet       . prochlorperazine (COMPAZINE) 10 MG tablet Take 1 tablet (10 mg total) by mouth every 6 (six) hours as needed for nausea or vomiting.  60 tablet  0  . pseudoephedrine (SUDAFED) 30 MG tablet Take 30 mg by mouth 2 (two) times daily. 1/2 tab in am and 1/2 tab in evening       No current facility-administered medications for this visit.    SURGICAL HISTORY:  Past Surgical History  Procedure Laterality Date  . Tonsillectomy    . Conization for cervical dysplasia    . Rul lobectomy  2010  . Esophogus stretching      REVIEW OF SYSTEMS:  Constitutional: positive for fatigue Eyes: negative Ears, nose, mouth, throat, and face: negative Respiratory: positive for cough, dyspnea on exertion and sputum Cardiovascular: negative Gastrointestinal: negative Genitourinary:negative Integument/breast: negative Hematologic/lymphatic: negative Musculoskeletal:negative Neurological: negative Behavioral/Psych: negative Endocrine: negative Allergic/Immunologic: negative   PHYSICAL EXAMINATION: General appearance: alert, cooperative and no distress Head: Normocephalic, without obvious abnormality, atraumatic Neck: no adenopathy Lymph nodes: Cervical, supraclavicular, and axillary nodes normal. Resp: clear to auscultation bilaterally Cardio: regular rate and rhythm, S1, S2 normal, no murmur, click, rub or gallop GI: soft, non-tender; bowel sounds normal; no masses,  no organomegaly Extremities: extremities normal, atraumatic, no cyanosis or edema Neurologic:  Alert and oriented X 3, normal strength and tone. Normal symmetric reflexes. Normal coordination and gait  ECOG PERFORMANCE STATUS: 1 - Symptomatic but completely ambulatory  Blood pressure 177/65, pulse 67, temperature 98.5 F (36.9 C), temperature source Oral, resp. rate 18, height '5\' 5"'  (1.651 m), weight 149 lb 12.8 oz (67.949 kg), SpO2 96.00%.  LABORATORY DATA: Lab Results  Component Value Date   WBC 9.7 12/13/2013   HGB 14.3 12/13/2013   HCT 42.6 12/13/2013   MCV 93.4 12/13/2013   PLT 266 12/13/2013      Chemistry      Component Value Date/Time   NA 141 12/13/2013 0855   NA 140 08/16/2013 2138   NA 137 01/16/2012 0941   K 3.7 12/13/2013 0855   K 3.4* 08/16/2013 2138   K 4.1 01/16/2012 0941   CL 99 08/16/2013 2138   CL 105 07/17/2012 1434   CL 100 01/16/2012 0941   CO2 24 12/13/2013 0855   CO2 24 08/16/2013 2138   CO2 28 01/16/2012 0941  BUN 17.6 12/13/2013 0855   BUN 23 08/16/2013 2138   BUN 20 01/16/2012 0941   CREATININE 1.6* 12/13/2013 0855   CREATININE 1.27* 08/16/2013 2138   CREATININE 1.2 01/16/2012 0941      Component Value Date/Time   CALCIUM 9.6 12/13/2013 0855   CALCIUM 9.4 08/16/2013 2138   CALCIUM 9.1 01/16/2012 0941   ALKPHOS 61 12/13/2013 0855   ALKPHOS 46 01/16/2012 0941   ALKPHOS 50 10/18/2011 1415   AST 19 12/13/2013 0855   AST 23 01/16/2012 0941   AST 17 10/18/2011 1415   ALT 17 12/13/2013 0855   ALT 21 01/16/2012 0941   ALT 12 10/18/2011 1415   BILITOT 1.00 12/13/2013 0855   BILITOT 0.60 01/16/2012 0941   BILITOT 0.4 10/18/2011 1415       RADIOGRAPHIC STUDIES:  ASSESSMENT AND PLAN: This is a very pleasant 73 years old white female with recurrent non-small cell lung cancer, adenocarcinoma with conflicting reports regarding the EGFR mutation including the recent one from the biopsy of the left upper lobe lung nodule that showed mutation at exon 18. She recently completed a course of stereotactic radiotherapy to the left prevascular lymphadenopathy under the care of Dr. Pablo Ledger. She is currently on  treatment with Tarceva 150 mg by mouth daily for the last 2 weeks and tolerating it fairly well except for a few episodes of diarrhea, lack of taste and fatigue. She has very mild skin rash. I recommended for the patient to continue her current treatment with Tarceva at the same dose. I would see her back for follow up visit in 2 weeks for reevaluation.  The patient was advised to call immediately if she has any concerning symptoms in the interval.  All questions were answered. The patient knows to call the clinic with any problems, questions or concerns. We can certainly see the patient much sooner if necessary. Disclaimer: This note was dictated with voice recognition software. Similar sounding words can inadvertently be transcribed and may not be corrected upon review.

## 2013-12-23 ENCOUNTER — Telehealth: Payer: Self-pay | Admitting: *Deleted

## 2013-12-23 NOTE — Telephone Encounter (Signed)
Called to follow up with [name].  I have been in touch with pt insurance and assistance programs to see if she qualify for financial support to pay for medication.  Unfortunately, she does not qualify.  I call and explained this to pt.  She was thankful for the extra effort me calling and checking to see if she qualifies.

## 2013-12-27 ENCOUNTER — Ambulatory Visit (HOSPITAL_BASED_OUTPATIENT_CLINIC_OR_DEPARTMENT_OTHER): Payer: Medicare Other | Admitting: Internal Medicine

## 2013-12-27 ENCOUNTER — Other Ambulatory Visit: Payer: Self-pay | Admitting: Medical Oncology

## 2013-12-27 ENCOUNTER — Other Ambulatory Visit (HOSPITAL_BASED_OUTPATIENT_CLINIC_OR_DEPARTMENT_OTHER): Payer: Medicare Other

## 2013-12-27 ENCOUNTER — Encounter: Payer: Self-pay | Admitting: Internal Medicine

## 2013-12-27 ENCOUNTER — Telehealth: Payer: Self-pay | Admitting: Internal Medicine

## 2013-12-27 VITALS — BP 153/68 | HR 71 | Temp 97.9°F | Resp 18 | Ht 65.0 in | Wt 148.6 lb

## 2013-12-27 DIAGNOSIS — R197 Diarrhea, unspecified: Secondary | ICD-10-CM

## 2013-12-27 DIAGNOSIS — R21 Rash and other nonspecific skin eruption: Secondary | ICD-10-CM | POA: Diagnosis not present

## 2013-12-27 DIAGNOSIS — L259 Unspecified contact dermatitis, unspecified cause: Secondary | ICD-10-CM | POA: Diagnosis not present

## 2013-12-27 DIAGNOSIS — C3410 Malignant neoplasm of upper lobe, unspecified bronchus or lung: Secondary | ICD-10-CM

## 2013-12-27 DIAGNOSIS — C3412 Malignant neoplasm of upper lobe, left bronchus or lung: Secondary | ICD-10-CM

## 2013-12-27 LAB — CBC WITH DIFFERENTIAL/PLATELET
BASO%: 0.4 % (ref 0.0–2.0)
Basophils Absolute: 0 10*3/uL (ref 0.0–0.1)
EOS%: 12.7 % — AB (ref 0.0–7.0)
Eosinophils Absolute: 1 10*3/uL — ABNORMAL HIGH (ref 0.0–0.5)
HCT: 42.6 % (ref 34.8–46.6)
HGB: 14 g/dL (ref 11.6–15.9)
LYMPH#: 0.9 10*3/uL (ref 0.9–3.3)
LYMPH%: 10.8 % — AB (ref 14.0–49.7)
MCH: 31 pg (ref 25.1–34.0)
MCHC: 32.9 g/dL (ref 31.5–36.0)
MCV: 94.4 fL (ref 79.5–101.0)
MONO#: 1.4 10*3/uL — AB (ref 0.1–0.9)
MONO%: 17 % — ABNORMAL HIGH (ref 0.0–14.0)
NEUT#: 4.8 10*3/uL (ref 1.5–6.5)
NEUT%: 59.1 % (ref 38.4–76.8)
Platelets: 225 10*3/uL (ref 145–400)
RBC: 4.51 10*6/uL (ref 3.70–5.45)
RDW: 14 % (ref 11.2–14.5)
WBC: 8 10*3/uL (ref 3.9–10.3)

## 2013-12-27 LAB — COMPREHENSIVE METABOLIC PANEL (CC13)
ALT: 16 U/L (ref 0–55)
AST: 17 U/L (ref 5–34)
Albumin: 3.3 g/dL — ABNORMAL LOW (ref 3.5–5.0)
Alkaline Phosphatase: 55 U/L (ref 40–150)
Anion Gap: 11 mEq/L (ref 3–11)
BILIRUBIN TOTAL: 1.18 mg/dL (ref 0.20–1.20)
BUN: 20.2 mg/dL (ref 7.0–26.0)
CHLORIDE: 102 meq/L (ref 98–109)
CO2: 26 mEq/L (ref 22–29)
CREATININE: 1.4 mg/dL — AB (ref 0.6–1.1)
Calcium: 9.6 mg/dL (ref 8.4–10.4)
Glucose: 114 mg/dl (ref 70–140)
Potassium: 3.8 mEq/L (ref 3.5–5.1)
Sodium: 139 mEq/L (ref 136–145)
TOTAL PROTEIN: 6.2 g/dL — AB (ref 6.4–8.3)

## 2013-12-27 MED ORDER — ERLOTINIB HCL 100 MG PO TABS: 100.0000 mg | ORAL_TABLET | Freq: Every day | ORAL | Status: DC

## 2013-12-27 NOTE — Progress Notes (Signed)
[place] Telephone:([phone]   Fax:(336) [phone]  OFFICE PROGRESS NOTE  [name], MD [address] [place] [address]  PRINCIPAL DIAGNOSES:  1. 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 The patient presented at that time with a right upper lobe lesion, as well as suspicious ground-glass opacities in the left lung. The tumor was negative for EGFR mutation and negative for ALK gene translocation. Veristrat test Good. 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 2. Stage IA malignant melanoma, status post wide excision by Dr. Allyson Sabal on Nov 12, 2008.  PRIOR THERAPY:  1) Status post right upper lobectomy with lymph node dissection under the care of Dr. Arlyce Dice on May 21, 2008.  2) Stereotactic radiotherapy to the left upper lobe lung nodule under the care of Dr. Pablo Ledger expected to be completed on 10/21/2011.  3) stereotactic radiotherapy to the left prevascular lymphadenopathy under the care of Dr. Pablo Ledger completed on 09/26/2013. 4) Tarceva 150 mg by mouth daily started 11/29/2013 discontinued today secondary to intolerance with persistent diarrhea and fatigue.  CURRENT THERAPY: Tarceva 100 mg by mouth daily started on 12/27/2013.  Advanced directives: The patient has advanced directive and no change is requested.  INTERVAL HISTORY: [name] 73 y.o. female returns to the clinic today for follow up visit accompanied by her husband. She started Tarceva 150 mg 4 weeks ago and tolerating it fairly well except for persistent few episodes of diarrhea and fatigue. She took Imodium as prescribed for her diarrhea but this was complicated with constipation for several days.  She is now adjusting her dose of Imodium as needed but she still concerned about the few episodes of diarrhea that affecting her social life. She lost few pounds in recently. She has very mild skin rash on the face. She also has mild skin rash under the left breast. The patient denied having any fever or chills. She has no nausea or vomiting. She denied having any significant chest pain or hemoptysis.   MEDICAL HISTORY: Past Medical History  Diagnosis Date  . LBBB (left bundle branch block)   . Hypertension   . Hyperthyroidism     dr Loanne Drilling- radioactive iodine treatment 2011- resolved  . Tremor     hand- intention tremor  . Complication of anesthesia     difficulty awakening, dysrhythmia post surgery  . lung ca dx'd 07/2008    lung- right non small cell, adenocarcinoma w/ broncho- alveolar features  . Melanoma     left leg  . Cervical ca dx'd 1988    surg only  . Radiation 10/11/11-10/21/11    Left upper lobe adenocarcinoma  . Hx of radiation therapy 09/16/13-09/30/13    prevascular lymph node    ALLERGIES:  is allergic to meperidine hcl; morphine; penicillins; oxycodone-acetaminophen; propoxyphene n-acetaminophen; and tramadol hcl.  MEDICATIONS:  Current Outpatient Prescriptions  Medication Sig Dispense Refill  . ANGELIQ 0.5-1 MG per tablet TAKE 1 TABLET BY MOUTH EVERY DAY  28 tablet  11  . Aspirin-Salicylamide-Caffeine (BC HEADACHE POWDER PO) Take 1 packet by mouth 3 (three) times daily.       Marland Kitchen atenolol (TENORMIN) 25 MG tablet TAKE 1 TABLET BY MOUTH TWICE DAILY  180 tablet  0  .  calcium-vitamin D (OSCAL WITH D) 500-200 MG-UNIT per tablet Take 1 tablet by mouth daily.       . celecoxib (CELEBREX) 200 MG capsule Take 200 mg by mouth daily as needed. For pain      . diltiazem (DILACOR XR) 240 MG 24 hr capsule Take 240 mg by mouth daily.      Marland Kitchen erlotinib (TARCEVA) 150 MG tablet Take 1 tablet (150 mg total) by mouth daily. Take on an empty stomach 1 hour before meals or 2 hours after.  30  tablet  2  . hydrochlorothiazide (HYDRODIURIL) 25 MG tablet Take 25 mg by mouth daily.      Marland Kitchen loperamide (IMODIUM) 2 MG capsule Take 2 mg by mouth daily as needed for diarrhea or loose stools.      . Multiple Vitamin (MULITIVITAMIN WITH MINERALS) TABS Take 1 tablet by mouth daily.      . Multiple Vitamins-Minerals (PRESERVISION AREDS 2 PO) Take 2 tablets by mouth daily.      . prochlorperazine (COMPAZINE) 10 MG tablet Take 1 tablet (10 mg total) by mouth every 6 (six) hours as needed for nausea or vomiting.  60 tablet  0   No current facility-administered medications for this visit.    SURGICAL HISTORY:  Past Surgical History  Procedure Laterality Date  . Tonsillectomy    . Conization for cervical dysplasia    . Rul lobectomy  2010  . Esophogus stretching      REVIEW OF SYSTEMS:  Constitutional: positive for fatigue and weight loss Eyes: negative Ears, nose, mouth, throat, and face: negative Respiratory: positive for dyspnea on exertion Cardiovascular: negative Gastrointestinal: positive for diarrhea Genitourinary:negative Integument/breast: positive for rash Hematologic/lymphatic: negative Musculoskeletal:negative Neurological: negative Behavioral/Psych: negative Endocrine: negative Allergic/Immunologic: negative   PHYSICAL EXAMINATION: General appearance: alert, cooperative and no distress Head: Normocephalic, without obvious abnormality, atraumatic Neck: no adenopathy Lymph nodes: Cervical, supraclavicular, and axillary nodes normal. Resp: clear to auscultation bilaterally Cardio: regular rate and rhythm, S1, S2 normal, no murmur, click, rub or gallop GI: soft, non-tender; bowel sounds normal; no masses,  no organomegaly Extremities: extremities normal, atraumatic, no cyanosis or edema Neurologic: Alert and oriented X 3, normal strength and tone. Normal symmetric reflexes. Normal coordination and gait Skin exam: Mild skin rash on the face and under the left  breast.  ECOG PERFORMANCE STATUS: 1 - Symptomatic but completely ambulatory  Blood pressure 153/68, pulse 71, temperature 97.9 F (36.6 C), temperature source Oral, resp. rate 18, height _0  (1.651 m), weight 148 lb 9.6 oz (67.405 kg), SpO2 96.00%.  LABORATORY DATA: Lab Results  Component Value Date   WBC 8.0 12/27/2013   HGB 14.0 12/27/2013   HCT 42.6 12/27/2013   MCV 94.4 12/27/2013   PLT 225 12/27/2013      Chemistry      Component Value Date/Time   NA 139 12/27/2013 1018   NA 140 08/16/2013 2138   NA 137 01/16/2012 0941   K 3.8 12/27/2013 1018   K 3.4* 08/16/2013 2138   K 4.1 01/16/2012 0941   CL 99 08/16/2013 2138   CL 105 07/17/2012 1434   CL 100 01/16/2012 0941   CO2 26 12/27/2013 1018   CO2 24 08/16/2013 2138   CO2 28 01/16/2012 0941   BUN 20.2 12/27/2013 1018   BUN 23 08/16/2013 2138   BUN 20 01/16/2012 0941   CREATININE 1.4* 12/27/2013 1018   CREATININE 1.27* 08/16/2013 2138   CREATININE 1.2 01/16/2012 0941  Component Value Date/Time   CALCIUM 9.6 12/27/2013 1018   CALCIUM 9.4 08/16/2013 2138   CALCIUM 9.1 01/16/2012 0941   ALKPHOS 55 12/27/2013 1018   ALKPHOS 46 01/16/2012 0941   ALKPHOS 50 10/18/2011 1415   AST 17 12/27/2013 1018   AST 23 01/16/2012 0941   AST 17 10/18/2011 1415   ALT 16 12/27/2013 1018   ALT 21 01/16/2012 0941   ALT 12 10/18/2011 1415   BILITOT 1.18 12/27/2013 1018   BILITOT 0.60 01/16/2012 0941   BILITOT 0.4 10/18/2011 1415       RADIOGRAPHIC STUDIES:  ASSESSMENT AND PLAN: This is a very pleasant 73 years old white female with recurrent non-small cell lung cancer, adenocarcinoma with conflicting reports regarding the EGFR mutation including the recent one from the biopsy of the left upper lobe lung nodule that showed mutation at exon 18. She recently completed a course of stereotactic radiotherapy to the left prevascular lymphadenopathy under the care of Dr. Pablo Ledger. She is currently on treatment with Tarceva 150 mg by mouth daily for the last 4 weeks and tolerating it fairly  well except for a few episodes of diarrhea, lack of taste and fatigue. She has very mild skin rash. I had a lengthy discussion with the patient and her husband today about her current status and treatment options. I recommended for her to continue treatment with Tarceva but I will decrease the dose to 100 mg by mouth daily to see the patient and would have to better tolerance to this dose. I would see her back for followup visit in 4 weeks after repeating CT scan of the chest for reevaluation of her disease. She was advised to continue on Imodium as needed for diarrhea. She will continue to apply hydrocortisone cream to the skin rash under the left breast and if no improvement she will call back and I may consider nystatin powder to this area. The patient was advised to call immediately if she has any concerning symptoms in the interval.  All questions were answered. The patient knows to call the clinic with any problems, questions or concerns. We can certainly see the patient much sooner if necessary. I spent 15 minutes of face-to-face counseling with the patient and her husband out of the total visit time 25 minutes.  Disclaimer: This note was dictated with voice recognition software. Similar sounding words can inadvertently be transcribed and may not be corrected upon review.

## 2013-12-27 NOTE — Telephone Encounter (Signed)
gva dn printed apt sched and avs for pt fro July.Marland KitchenMarland KitchenMarland Kitchen

## 2014-01-01 ENCOUNTER — Telehealth: Payer: Self-pay | Admitting: *Deleted

## 2014-01-01 DIAGNOSIS — R21 Rash and other nonspecific skin eruption: Secondary | ICD-10-CM

## 2014-01-01 MED ORDER — METHYLPREDNISOLONE (PAK) 4 MG PO TABS: ORAL_TABLET | ORAL | Status: DC

## 2014-01-01 NOTE — Telephone Encounter (Signed)
Pt called stating that Dr Vista Mink told her to call if she continued to have a rash under her left breast.  Dr Vista Mink feels it is a yeast infection per his office note on 6/19.  Pt states rash is much worse and has not spread to her right breast and her neck.  She spoke to her dermatologist who placed her on flucatisone and ketolazole.  Pt wants to know if it is okay for her to take benadryl PO for the itching.  Per Dr Vista Mink, he will call in a medrol dosepack but he wants her to let her dermatologist know to make sure that is okay with them.  He also states PO benadryl is okay to take.  If her rash/yeast infection does not resolve she needs to contact her dermatologist.  Ted Mcalpine

## 2014-01-02 ENCOUNTER — Telehealth: Payer: Self-pay | Admitting: *Deleted

## 2014-01-02 NOTE — Telephone Encounter (Signed)
Pt called asking if she can hold her tarceva while her rash/yeast infection that her dermatologist and Dr Vista Mink are trying to resolve.  Per Dr Vista Mink okay for pt to hold tarceva for now while rash is being treated.  Pt asked if she can be seen.  Per Dr Vista Mink, okay to put appt in with AJ next week.  Appt with AJ made on 01/09/14.  Pt is aware of appt and will call if anything changes.  SLJ

## 2014-01-03 DIAGNOSIS — L270 Generalized skin eruption due to drugs and medicaments taken internally: Secondary | ICD-10-CM | POA: Diagnosis not present

## 2014-01-09 ENCOUNTER — Ambulatory Visit (HOSPITAL_BASED_OUTPATIENT_CLINIC_OR_DEPARTMENT_OTHER): Payer: Medicare Other | Admitting: Physician Assistant

## 2014-01-09 ENCOUNTER — Encounter: Payer: Self-pay | Admitting: Physician Assistant

## 2014-01-09 VITALS — BP 141/61 | HR 69 | Temp 97.9°F | Resp 20 | Ht 65.0 in | Wt 150.0 lb

## 2014-01-09 DIAGNOSIS — R599 Enlarged lymph nodes, unspecified: Secondary | ICD-10-CM | POA: Diagnosis not present

## 2014-01-09 DIAGNOSIS — C3410 Malignant neoplasm of upper lobe, unspecified bronchus or lung: Secondary | ICD-10-CM

## 2014-01-09 DIAGNOSIS — C3412 Malignant neoplasm of upper lobe, left bronchus or lung: Secondary | ICD-10-CM

## 2014-01-09 NOTE — Progress Notes (Addendum)
[place] Telephone:([phone]   Fax:(336) [place] NOTE  Jessica Hare, MD [address] [place] [address]  PRINCIPAL DIAGNOSES:  1. 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 The patient presented at that time with a right upper lobe lesion, as well as suspicious ground-glass opacities in the left lung. The tumor was negative for EGFR mutation and negative for ALK gene translocation. Veristrat test Good. 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 2. Stage IA malignant melanoma, status post wide excision by Dr. Allyson Sabal on Nov 12, 2008.  PRIOR THERAPY:  1) Status post right upper lobectomy with lymph node dissection under the care of Dr. Arlyce Dice on May 21, 2008.  2) Stereotactic radiotherapy to the left upper lobe lung nodule under the care of Dr. Pablo Ledger expected to be completed on 10/21/2011.  3) stereotactic radiotherapy to the left prevascular lymphadenopathy under the care of Dr. Pablo Ledger completed on 09/26/2013. 4) Tarceva 150 mg by mouth daily started 11/29/2013 discontinued today secondary to intolerance with persistent diarrhea and fatigue.  CURRENT THERAPY: Tarceva 100 mg by mouth daily started on 12/27/2013.  Advanced directives: The patient has advanced directive and no change is requested.  INTERVAL HISTORY: [name] 73 y.o. female returns to the clinic today for follow up visit accompanied by her husband. She is status post approximately 4 weeks of Tarceva 150 mg by mouth daily, this was discontinued secondary to the frequency and persistence of diarrhea. She had a mild rash associated with the 150 mg dose of Tarceva however once she  was lower to the 100 mg Tarceva dose she had a significant increase in skin rash affecting her face chest and arms beneath her breasts as well as an increase in vaginal yeast infections and also affecting the groin area and thighs. She takes the 100 mg Tarceva dose for approximately one week and has been off of this medication for the past week. She was seen by her dermatologist and given several creams and suggestions to deal with the yeast infection beneath both breasts as well as affecting her groin thighs and vagina. Since she has been off the Tarceva her rash has significantly improved. The patient denied having any fever or chills. She has no nausea or vomiting. She denied having any significant chest pain or hemoptysis. She finds the side effects from the Tarceva and tolerable into intrusiveness on her life.  MEDICAL HISTORY: Past Medical History  Diagnosis Date  . LBBB (left bundle branch block)   . Hypertension   . Hyperthyroidism     dr Loanne Drilling- radioactive iodine treatment 2011- resolved  . Tremor     hand- intention tremor  . Complication of anesthesia     difficulty awakening, dysrhythmia post surgery  . lung ca dx'd 07/2008    lung- right non small cell, adenocarcinoma w/ broncho- alveolar features  . Melanoma     left leg  . Cervical ca dx'd 1988    surg only  . Radiation 10/11/11-10/21/11    Left upper lobe adenocarcinoma  . Hx of radiation therapy 09/16/13-09/30/13    prevascular lymph node    ALLERGIES:  is allergic to meperidine hcl; morphine; penicillins; oxycodone-acetaminophen; propoxyphene n-acetaminophen; and  tramadol hcl.  MEDICATIONS:  Current Outpatient Prescriptions  Medication Sig Dispense Refill  . ANGELIQ 0.5-1 MG per tablet TAKE 1 TABLET BY MOUTH EVERY DAY  28 tablet  11  . Aspirin-Salicylamide-Caffeine (BC HEADACHE POWDER PO) Take 1 packet by mouth 3 (three) times daily.       Marland Kitchen atenolol (TENORMIN) 25 MG tablet TAKE 1 TABLET BY MOUTH TWICE DAILY  180 tablet   0  . azithromycin (ZITHROMAX) 250 MG tablet Take 250 mg by mouth daily.      . calcium-vitamin D (OSCAL WITH D) 500-200 MG-UNIT per tablet Take 1 tablet by mouth daily.       . celecoxib (CELEBREX) 200 MG capsule Take 200 mg by mouth daily as needed. For pain      . diltiazem (DILACOR XR) 240 MG 24 hr capsule Take 240 mg by mouth daily.      . fluconazole (DIFLUCAN) 100 MG tablet Take 100 mg by mouth daily.      . fluocinonide cream (LIDEX) 5.36 % Apply 1 application topically 2 (two) times daily.      . hydrochlorothiazide (HYDRODIURIL) 25 MG tablet Take 25 mg by mouth daily.      . Hydrocortisone (CORTIZONE-10 EX) Apply topically.      . hydrOXYzine (ATARAX/VISTARIL) 10 MG tablet Take 10 mg by mouth every 4 (four) hours as needed.      Marland Kitchen Ketoconazole-Hydrocortisone 2 & 1 % (CREAM) KIT Apply topically.      . miconazole (MONISTAT 7) 100 MG vaginal suppository Place 100 mg vaginally at bedtime.      . Multiple Vitamin (MULITIVITAMIN WITH MINERALS) TABS Take 1 tablet by mouth daily.      . Multiple Vitamins-Minerals (PRESERVISION AREDS 2 PO) Take 2 tablets by mouth daily.      . predniSONE (DELTASONE) 10 MG tablet Take 10 mg by mouth daily with breakfast.      . Probiotic Product (PROBIOTIC DAILY PO) Take by mouth.      . prochlorperazine (COMPAZINE) 10 MG tablet Take 1 tablet (10 mg total) by mouth every 6 (six) hours as needed for nausea or vomiting.  60 tablet  0  . tioconazole (MONISTAT 1) 6.5 % vaginal ointment Place 1 applicator vaginally once.      . erlotinib (TARCEVA) 100 MG tablet Take 1 tablet (100 mg total) by mouth daily. Take on an empty stomach 1 hour before meals or 2 hours after.  30 tablet  1  . loperamide (IMODIUM) 2 MG capsule Take 2 mg by mouth daily as needed for diarrhea or loose stools.      . methylPREDNIsolone (MEDROL DOSPACK) 4 MG tablet follow package directions  21 tablet  0   No current facility-administered medications for this visit.    SURGICAL HISTORY:  Past  Surgical History  Procedure Laterality Date  . Tonsillectomy    . Conization for cervical dysplasia    . Rul lobectomy  2010  . Esophogus stretching      REVIEW OF SYSTEMS:  Constitutional: positive for fatigue and weight loss Eyes: negative Ears, nose, mouth, throat, and face: negative Respiratory: positive for dyspnea on exertion Cardiovascular: negative Gastrointestinal: positive for diarrhea Genitourinary:negative Integument/breast: positive for rash Hematologic/lymphatic: negative Musculoskeletal:negative Neurological: negative Behavioral/Psych: negative Endocrine: negative Allergic/Immunologic: negative   PHYSICAL EXAMINATION: General appearance: alert, cooperative and no distress Head: Normocephalic, without obvious abnormality, atraumatic Neck: no adenopathy Lymph nodes: Cervical, supraclavicular, and axillary nodes normal. Resp: clear to auscultation bilaterally Cardio: regular rate and  rhythm, S1, S2 normal, no murmur, click, rub or gallop GI: soft, non-tender; bowel sounds normal; no masses,  no organomegaly Extremities: extremities normal, atraumatic, no cyanosis or edema Neurologic: Alert and oriented X 3, normal strength and tone. Normal symmetric reflexes. Normal coordination and gait Skin exam: Moderate skin rash on the face. Minimal rash beneath both breasts, and is primarily just mild erythema although there is some skin desquamation lower portion of the breasts, right greater than left  ECOG PERFORMANCE STATUS: 1 - Symptomatic but completely ambulatory  Blood pressure 141/61, pulse 69, temperature 97.9 F (36.6 C), temperature source Oral, resp. rate 20, height _0  (1.651 m), weight 150 lb (68.04 kg).  LABORATORY DATA: Lab Results  Component Value Date   WBC 8.0 12/27/2013   HGB 14.0 12/27/2013   HCT 42.6 12/27/2013   MCV 94.4 12/27/2013   PLT 225 12/27/2013      Chemistry      Component Value Date/Time   NA 139 12/27/2013 1018   NA 140 08/16/2013  2138   NA 137 01/16/2012 0941   K 3.8 12/27/2013 1018   K 3.4* 08/16/2013 2138   K 4.1 01/16/2012 0941   CL 99 08/16/2013 2138   CL 105 07/17/2012 1434   CL 100 01/16/2012 0941   CO2 26 12/27/2013 1018   CO2 24 08/16/2013 2138   CO2 28 01/16/2012 0941   BUN 20.2 12/27/2013 1018   BUN 23 08/16/2013 2138   BUN 20 01/16/2012 0941   CREATININE 1.4* 12/27/2013 1018   CREATININE 1.27* 08/16/2013 2138   CREATININE 1.2 01/16/2012 0941      Component Value Date/Time   CALCIUM 9.6 12/27/2013 1018   CALCIUM 9.4 08/16/2013 2138   CALCIUM 9.1 01/16/2012 0941   ALKPHOS 55 12/27/2013 1018   ALKPHOS 46 01/16/2012 0941   ALKPHOS 50 10/18/2011 1415   AST 17 12/27/2013 1018   AST 23 01/16/2012 0941   AST 17 10/18/2011 1415   ALT 16 12/27/2013 1018   ALT 21 01/16/2012 0941   ALT 12 10/18/2011 1415   BILITOT 1.18 12/27/2013 1018   BILITOT 0.60 01/16/2012 0941   BILITOT 0.4 10/18/2011 1415       RADIOGRAPHIC STUDIES:  ASSESSMENT AND PLAN: This is a very pleasant 73 years old white female with recurrent non-small cell lung cancer, adenocarcinoma with conflicting reports regarding the EGFR mutation including the recent one from the biopsy of the left upper lobe lung nodule that showed mutation at exon 18. She recently completed a course of stereotactic radiotherapy to the left prevascular lymphadenopathy under the care of Dr. Pablo Ledger. She was most recently on Tarceva at 100 mg by mouth daily however this was discontinued after 1 week due to significant increase in rash as well as continued periodic episodes of diarrhea. Patient was discussed with and also seen by Dr. Julien Nordmann. We will have her remain off of the Tarceva and she seems to be intolerant although we may consider decreasing the dose further to 50 mg by mouth daily. She will keep her scheduled appointments for restaging CT scan and followup appointment with Dr. Julien Nordmann. Whether to move forward with an even lower dose of Tarceva or pursue other treatment options will be discussed when the  patient returns to discuss the results of the CT scan with Dr. Julien Nordmann.   She was advised to continue on Imodium as needed for diarrhea.  The patient was advised to call immediately if she has any concerning symptoms in the interval.  All questions were answered. The patient knows to call the clinic with any problems, questions or concerns. We can certainly see the patient much sooner if necessary.  Carlton Adam PA-C  ADDENDUM:  Hematology/Oncology Attending:  I had a face to face encounter with the patient. I recommended her care plan. This is a very pleasant 73 years old white female with recurrent non-small cell lung cancer, adenocarcinoma with positive EGFR mutation in exon 18 recently started on treatment with single agent Tarceva initially with 150 mg by mouth daily for the first month but she was unable to tolerate it secondary to significant skin rash and few episodes of diarrhea. she was switched to a lower dose 100 mg by mouth daily but still unable to tolerate her treatment because of the significant skin rash and she discontinued her medication. I recommended for her to continue holding Tarceva for now. She would have repeat CT scan of the chest in 3 weeks for evaluation of her disease. She was advised to continue with her current treatment with the lotions and drains prescribed by her dermatologist. She was advised to call immediately if she has any concerning symptoms in the interval.  Disclaimer: This note was dictated with voice recognition software. Similar sounding words can inadvertently be transcribed and may not be corrected upon review. Eilleen Kempf., MD 01/11/2014

## 2014-01-10 NOTE — Patient Instructions (Signed)
Continue off of Tarceva as advised Keep your scheduled appointment for your restaging CT scan and followup appointment with Dr. Julien Nordmann

## 2014-01-23 ENCOUNTER — Other Ambulatory Visit: Payer: Self-pay | Admitting: Cardiology

## 2014-01-24 ENCOUNTER — Ambulatory Visit (HOSPITAL_COMMUNITY)
Admission: RE | Admit: 2014-01-24 | Discharge: 2014-01-24 | Disposition: A | Discharge status: Home / Self Care | Payer: Medicare Other | Source: Ambulatory Visit | Attending: Internal Medicine | Admitting: Internal Medicine

## 2014-01-24 ENCOUNTER — Other Ambulatory Visit (HOSPITAL_BASED_OUTPATIENT_CLINIC_OR_DEPARTMENT_OTHER): Payer: Medicare Other

## 2014-01-24 DIAGNOSIS — C3410 Malignant neoplasm of upper lobe, unspecified bronchus or lung: Secondary | ICD-10-CM

## 2014-01-24 DIAGNOSIS — C3412 Malignant neoplasm of upper lobe, left bronchus or lung: Secondary | ICD-10-CM

## 2014-01-24 DIAGNOSIS — R599 Enlarged lymph nodes, unspecified: Secondary | ICD-10-CM | POA: Insufficient documentation

## 2014-01-24 DIAGNOSIS — Z902 Acquired absence of lung [part of]: Secondary | ICD-10-CM | POA: Diagnosis not present

## 2014-01-24 DIAGNOSIS — Z923 Personal history of irradiation: Secondary | ICD-10-CM | POA: Insufficient documentation

## 2014-01-24 DIAGNOSIS — J398 Other specified diseases of upper respiratory tract: Secondary | ICD-10-CM | POA: Diagnosis not present

## 2014-01-24 LAB — CBC WITH DIFFERENTIAL/PLATELET
BASO%: 1.4 % (ref 0.0–2.0)
BASOS ABS: 0.1 10*3/uL (ref 0.0–0.1)
EOS%: 16 % — ABNORMAL HIGH (ref 0.0–7.0)
Eosinophils Absolute: 1.2 10*3/uL — ABNORMAL HIGH (ref 0.0–0.5)
HEMATOCRIT: 37.1 % (ref 34.8–46.6)
HEMOGLOBIN: 12.3 g/dL (ref 11.6–15.9)
LYMPH#: 1.5 10*3/uL (ref 0.9–3.3)
LYMPH%: 19.3 % (ref 14.0–49.7)
MCH: 31.1 pg (ref 25.1–34.0)
MCHC: 33 g/dL (ref 31.5–36.0)
MCV: 94.1 fL (ref 79.5–101.0)
MONO#: 1 10*3/uL — ABNORMAL HIGH (ref 0.1–0.9)
MONO%: 13.1 % (ref 0.0–14.0)
NEUT#: 3.9 10*3/uL (ref 1.5–6.5)
NEUT%: 50.2 % (ref 38.4–76.8)
PLATELETS: 253 10*3/uL (ref 145–400)
RBC: 3.95 10*6/uL (ref 3.70–5.45)
RDW: 13.7 % (ref 11.2–14.5)
WBC: 7.8 10*3/uL (ref 3.9–10.3)

## 2014-01-24 LAB — COMPREHENSIVE METABOLIC PANEL (CC13)
ALT: 18 U/L (ref 0–55)
AST: 16 U/L (ref 5–34)
Albumin: 3.1 g/dL — ABNORMAL LOW (ref 3.5–5.0)
Alkaline Phosphatase: 57 U/L (ref 40–150)
Anion Gap: 10 mEq/L (ref 3–11)
BUN: 20.9 mg/dL (ref 7.0–26.0)
CALCIUM: 9.2 mg/dL (ref 8.4–10.4)
CHLORIDE: 108 meq/L (ref 98–109)
CO2: 24 mEq/L (ref 22–29)
Creatinine: 1.5 mg/dL — ABNORMAL HIGH (ref 0.6–1.1)
Glucose: 100 mg/dl (ref 70–140)
Potassium: 3.8 mEq/L (ref 3.5–5.1)
Sodium: 141 mEq/L (ref 136–145)
Total Bilirubin: 0.31 mg/dL (ref 0.20–1.20)
Total Protein: 6 g/dL — ABNORMAL LOW (ref 6.4–8.3)

## 2014-01-24 MED ORDER — IOHEXOL 300 MG/ML  SOLN
80.0000 mL | Freq: Once | INTRAMUSCULAR | Status: AC | PRN
  Administered 2014-01-24: 60 mL via INTRAVENOUS

## 2014-01-28 ENCOUNTER — Ambulatory Visit (HOSPITAL_BASED_OUTPATIENT_CLINIC_OR_DEPARTMENT_OTHER): Payer: Medicare Other | Admitting: Internal Medicine

## 2014-01-28 ENCOUNTER — Encounter: Payer: Self-pay | Admitting: Internal Medicine

## 2014-01-28 VITALS — BP 140/55 | HR 76 | Temp 97.8°F | Resp 18 | Ht 65.0 in | Wt 149.3 lb

## 2014-01-28 DIAGNOSIS — Z85820 Personal history of malignant melanoma of skin: Secondary | ICD-10-CM

## 2014-01-28 DIAGNOSIS — C3410 Malignant neoplasm of upper lobe, unspecified bronchus or lung: Secondary | ICD-10-CM

## 2014-01-28 DIAGNOSIS — C3412 Malignant neoplasm of upper lobe, left bronchus or lung: Secondary | ICD-10-CM

## 2014-01-28 NOTE — Progress Notes (Signed)
[place] [phone]) [phone]   [phone]  OFFICE PROGRESS NOTE  Jessica Hare, MD No address on file  PRINCIPAL DIAGNOSES:  1. 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 The patient presented at that time with a right upper lobe lesion, as well as suspicious ground-glass opacities in the left lung. The tumor was negative for EGFR mutation and negative for ALK gene translocation. Veristrat test Good. 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 2. Stage IA malignant melanoma, status post wide excision by Dr. Allyson Sabal on Nov 12, 2008.  PRIOR THERAPY:  1) Status post right upper lobectomy with lymph node dissection under the care of Dr. Arlyce Dice on May 21, 2008.  2) Stereotactic radiotherapy to the left upper lobe lung nodule under the care of Dr. Pablo Ledger expected to be completed on 10/21/2011.  3) stereotactic radiotherapy to the left prevascular lymphadenopathy under the care of Dr. Pablo Ledger completed on 09/26/2013. 4) Tarceva 150 mg by mouth daily started 11/29/2013 discontinued today secondary to intolerance with persistent diarrhea and fatigue. 5) Tarceva 100 mg by mouth daily started on 12/27/2013. Discontinued one week after treatment because of intolerance.  CURRENT THERAPY: None  Advanced directives: The patient has advanced directive and no change is requested.  INTERVAL HISTORY: [name] 73 y.o. female returns to the clinic today for follow up visit accompanied by her husband. The patient is feeling much better today. Her skin rash completely resolved. She has no more episodes of diarrhea. She has been off Tarceva now for more than 3 weeks. She denied having any  significant weight loss or night sweats. The patient denied having any fever or chills. She has no nausea or vomiting. She denied having any significant chest pain or hemoptysis. She had repeat CT scan of the chest performed recently and she is here for evaluation and discussion of her scan results.  MEDICAL HISTORY: Past Medical History  Diagnosis Date  . LBBB (left bundle branch block)   . Hypertension   . Hyperthyroidism     dr Loanne Drilling- radioactive iodine treatment 2011- resolved  . Tremor     hand- intention tremor  . Complication of anesthesia     difficulty awakening, dysrhythmia post surgery  . lung ca dx'd 07/2008    lung- right non small cell, adenocarcinoma w/ broncho- alveolar features  . Melanoma     left leg  . Cervical ca dx'd 1988    surg only  . Radiation 10/11/11-10/21/11    Left upper lobe adenocarcinoma  . Hx of radiation therapy 09/16/13-09/30/13    prevascular lymph node    ALLERGIES:  is allergic to meperidine hcl; morphine; penicillins; oxycodone-acetaminophen; propoxyphene n-acetaminophen; and tramadol hcl.  MEDICATIONS:  Current Outpatient Prescriptions  Medication Sig Dispense Refill  . ANGELIQ 0.5-1 MG per tablet TAKE 1 TABLET BY MOUTH EVERY DAY  28 tablet  11  . Aspirin-Salicylamide-Caffeine (BC HEADACHE POWDER PO) Take 1 packet by mouth 3 (three) times daily.       Marland Kitchen atenolol (TENORMIN) 25 MG tablet TAKE 1 TABLET BY MOUTH TWICE DAILY  180 tablet  0  . calcium-vitamin D (OSCAL WITH D) 500-200 MG-UNIT per tablet Take 1 tablet by mouth daily.       Marland Kitchen  celecoxib (CELEBREX) 200 MG capsule Take 200 mg by mouth daily as needed. For pain      . diltiazem (DILACOR XR) 240 MG 24 hr capsule Take 240 mg by mouth daily.      . fluocinonide cream (LIDEX) 3.88 % Apply 1 application topically 2 (two) times daily.      . hydrochlorothiazide (HYDRODIURIL) 25 MG tablet Take 25 mg by mouth daily.      . Hydrocortisone (CORTIZONE-10 EX) Apply topically.      . hydrOXYzine  (ATARAX/VISTARIL) 10 MG tablet Take 10 mg by mouth every 4 (four) hours as needed.      Marland Kitchen Ketoconazole-Hydrocortisone 2 & 1 % (CREAM) KIT Apply topically.      Marland Kitchen loperamide (IMODIUM) 2 MG capsule Take 2 mg by mouth daily as needed for diarrhea or loose stools.      . miconazole (MONISTAT 7) 100 MG vaginal suppository Place 100 mg vaginally at bedtime.      . Multiple Vitamin (MULITIVITAMIN WITH MINERALS) TABS Take 1 tablet by mouth daily.      . Multiple Vitamins-Minerals (PRESERVISION AREDS 2 PO) Take 2 tablets by mouth daily.      . Probiotic Product (PROBIOTIC DAILY PO) Take by mouth.      . tioconazole (MONISTAT 1) 6.5 % vaginal ointment Place 1 applicator vaginally once.      . prochlorperazine (COMPAZINE) 10 MG tablet Take 1 tablet (10 mg total) by mouth every 6 (six) hours as needed for nausea or vomiting.  60 tablet  0   No current facility-administered medications for this visit.    SURGICAL HISTORY:  Past Surgical History  Procedure Laterality Date  . Tonsillectomy    . Conization for cervical dysplasia    . Rul lobectomy  2010  . Esophogus stretching      REVIEW OF SYSTEMS:  Constitutional: negative Eyes: negative Ears, nose, mouth, throat, and face: negative Respiratory: negative Cardiovascular: negative Gastrointestinal: negative Genitourinary:negative Integument/breast: negative Hematologic/lymphatic: negative Musculoskeletal:negative Neurological: negative Behavioral/Psych: negative Endocrine: negative Allergic/Immunologic: negative   PHYSICAL EXAMINATION: General appearance: alert, cooperative and no distress Head: Normocephalic, without obvious abnormality, atraumatic Neck: no adenopathy Lymph nodes: Cervical, supraclavicular, and axillary nodes normal. Resp: clear to auscultation bilaterally Cardio: regular rate and rhythm, S1, S2 normal, no murmur, click, rub or gallop GI: soft, non-tender; bowel sounds normal; no masses,  no organomegaly Extremities:  extremities normal, atraumatic, no cyanosis or edema Neurologic: Alert and oriented X 3, normal strength and tone. Normal symmetric reflexes. Normal coordination and gait  ECOG PERFORMANCE STATUS: 1 - Symptomatic but completely ambulatory  Blood pressure 140/55, pulse 76, temperature 97.8 F (36.6 C), temperature source Oral, resp. rate 18, height '5\' 5"'  (1.651 m), weight 149 lb 4.8 oz (67.722 kg), SpO2 98.00%.  LABORATORY DATA: Lab Results  Component Value Date   WBC 7.8 01/24/2014   HGB 12.3 01/24/2014   HCT 37.1 01/24/2014   MCV 94.1 01/24/2014   PLT 253 01/24/2014      Chemistry      Component Value Date/Time   NA 141 01/24/2014 0900   NA 140 08/16/2013 2138   NA 137 01/16/2012 0941   K 3.8 01/24/2014 0900   K 3.4* 08/16/2013 2138   K 4.1 01/16/2012 0941   CL 99 08/16/2013 2138   CL 105 07/17/2012 1434   CL 100 01/16/2012 0941   CO2 24 01/24/2014 0900   CO2 24 08/16/2013 2138   CO2 28 01/16/2012 0941   BUN 20.9 01/24/2014  0900   BUN 23 08/16/2013 2138   BUN 20 01/16/2012 0941   CREATININE 1.5* 01/24/2014 0900   CREATININE 1.27* 08/16/2013 2138   CREATININE 1.2 01/16/2012 0941      Component Value Date/Time   CALCIUM 9.2 01/24/2014 0900   CALCIUM 9.4 08/16/2013 2138   CALCIUM 9.1 01/16/2012 0941   ALKPHOS 57 01/24/2014 0900   ALKPHOS 46 01/16/2012 0941   ALKPHOS 50 10/18/2011 1415   AST 16 01/24/2014 0900   AST 23 01/16/2012 0941   AST 17 10/18/2011 1415   ALT 18 01/24/2014 0900   ALT 21 01/16/2012 0941   ALT 12 10/18/2011 1415   BILITOT 0.31 01/24/2014 0900   BILITOT 0.60 01/16/2012 0941   BILITOT 0.4 10/18/2011 1415       RADIOGRAPHIC STUDIES: Ct Chest W Contrast  01/24/2014   CLINICAL DATA:  History of lung cancer (adenocarcinoma) diagnosed in 2010 status post right upper lobectomy. Additionally, patient has had stereotactic radiotherapy to the left upper lobe completed in April 2013, and stereotactic radiotherapy to left prevascular lymphadenopathy completed in March 2015.  EXAM: CT CHEST WITH CONTRAST   TECHNIQUE: Multidetector CT imaging of the chest was performed during intravenous contrast administration.  CONTRAST:  77m OMNIPAQUE IOHEXOL 300 MG/ML  SOLN  COMPARISON:  CT 11/07/2013, PET-CT 08/21/2013, CT 08/05/2013  FINDINGS: Within the left upper lobe the nodule of concern measures 8 mm x 7 mm (image 17, series 5) not changed from 8 mm x 7 mm on CT of 11/07/2013. However within the band of linear parenchymal thickening within the left upper lobe, there is increased nodular thickening along a bronchus measuring 5 mm (image 16, series 5) increased from 3 mm on prior.  The ground-glass nodules within the inferior left upper lobe and anterior right lower lobe are not significantly changed in size. No suspicious nodules the right lung.  The prevascular lymph node treated with stereotactic radiation therapy remains small at 7 mm (image 23, series 2). No mediastinal or supraclavicular adenopathy.  Large hiatal hernia is noted. The left adrenal gland is mildly thickened similar to comparison exams. No focal hepatic lesion evident. No aggressive osseous lesion  IMPRESSION: 1. Subtle focus of peribronchial thickening in the left upper lobe along the band of radiation change. This is concerning for local recurrence. At this lesion is small (5 mm) recommend additional short-term CT follow-up. 2. The 8 mm nodule of concern on most recent comparison CT in left upper lobe is not changed in size. 3. Left lung round-glass nodules described previously are also unchanged. 4. Treated AP window / prevascular lymph node is small.   Electronically Signed   By: SSuzy BouchardM.D.   On: 01/24/2014 14:05   ASSESSMENT AND PLAN: This is a very pleasant 73years old white female with recurrent non-small cell lung cancer, adenocarcinoma with conflicting reports regarding the EGFR mutation including the recent one from the biopsy of the left upper lobe lung nodule that showed mutation at exon 18. She recently completed a course of  stereotactic radiotherapy to the left prevascular lymphadenopathy under the care of Dr. WPablo Ledger She was recently treated with a course of Tarceva initially at 150 mg by mouth daily for one month followed by 1 week treatment at a reduced dose of 100 mg by mouth daily but unfortunately the patient was unable to tolerate her treatment and this was discontinued 3 weeks ago. Her recent CT scan showed no significant evidence for disease progression. I discussed the scan  results with the patient and her husband. I recommended for her to continue on observation for now since she has no evidence for disease progression. We discussed the option of treatment with Tarceva at a dose of 50 mg by mouth daily Versus consideration of immunotherapy if she has evidence for disease progression on the upcoming scans. I would see her back for followup visit in 3 months with repeat CT scan of the chest. She was advised to continue on Imodium as needed for diarrhea. All questions were answered. The patient knows to call the clinic with any problems, questions or concerns. We can certainly see the patient much sooner if necessary. I spent 15 minutes of face-to-face counseling with the patient and her husband out of the total visit time 25 minutes.  Disclaimer: This note was dictated with voice recognition software. Similar sounding words can inadvertently be transcribed and may not be corrected upon review.

## 2014-01-30 ENCOUNTER — Telehealth: Payer: Self-pay | Admitting: Internal Medicine

## 2014-01-30 NOTE — Telephone Encounter (Signed)
S/w pt, gave appts for 10/20 and 10/22. advised Radiology wuld call to sched ct scan orderd. Mailed pt appt calendar.

## 2014-02-03 ENCOUNTER — Other Ambulatory Visit: Payer: Self-pay | Admitting: Cardiology

## 2014-03-26 DIAGNOSIS — M4120 Other idiopathic scoliosis, site unspecified: Secondary | ICD-10-CM | POA: Diagnosis not present

## 2014-03-26 DIAGNOSIS — M47817 Spondylosis without myelopathy or radiculopathy, lumbosacral region: Secondary | ICD-10-CM | POA: Diagnosis not present

## 2014-03-27 ENCOUNTER — Other Ambulatory Visit: Payer: Self-pay | Admitting: Orthopaedic Surgery

## 2014-03-27 DIAGNOSIS — M545 Low back pain, unspecified: Secondary | ICD-10-CM

## 2014-04-08 ENCOUNTER — Ambulatory Visit
Admission: RE | Admit: 2014-04-08 | Discharge: 2014-04-08 | Disposition: A | Discharge status: Home / Self Care | Payer: Medicare Other | Source: Ambulatory Visit | Attending: Orthopaedic Surgery | Admitting: Orthopaedic Surgery

## 2014-04-08 DIAGNOSIS — Z85118 Personal history of other malignant neoplasm of bronchus and lung: Secondary | ICD-10-CM | POA: Diagnosis not present

## 2014-04-08 DIAGNOSIS — M47817 Spondylosis without myelopathy or radiculopathy, lumbosacral region: Secondary | ICD-10-CM | POA: Diagnosis not present

## 2014-04-08 DIAGNOSIS — M48061 Spinal stenosis, lumbar region without neurogenic claudication: Secondary | ICD-10-CM | POA: Diagnosis not present

## 2014-04-08 DIAGNOSIS — M545 Low back pain, unspecified: Secondary | ICD-10-CM

## 2014-04-08 DIAGNOSIS — M5137 Other intervertebral disc degeneration, lumbosacral region: Secondary | ICD-10-CM | POA: Diagnosis not present

## 2014-04-11 ENCOUNTER — Other Ambulatory Visit: Payer: Self-pay | Admitting: Cardiology

## 2014-04-23 DIAGNOSIS — M5126 Other intervertebral disc displacement, lumbar region: Secondary | ICD-10-CM | POA: Diagnosis not present

## 2014-04-23 DIAGNOSIS — M47816 Spondylosis without myelopathy or radiculopathy, lumbar region: Secondary | ICD-10-CM | POA: Diagnosis not present

## 2014-04-23 DIAGNOSIS — Z6826 Body mass index (BMI) 26.0-26.9, adult: Secondary | ICD-10-CM | POA: Diagnosis not present

## 2014-04-23 DIAGNOSIS — M545 Low back pain: Secondary | ICD-10-CM | POA: Diagnosis not present

## 2014-04-28 ENCOUNTER — Other Ambulatory Visit: Payer: Self-pay | Admitting: Cardiology

## 2014-04-29 ENCOUNTER — Other Ambulatory Visit: Payer: Medicare Other

## 2014-04-29 ENCOUNTER — Ambulatory Visit (HOSPITAL_COMMUNITY)
Admission: RE | Admit: 2014-04-29 | Discharge: 2014-04-29 | Disposition: A | Discharge status: Home / Self Care | Payer: Medicare Other | Source: Ambulatory Visit | Attending: Internal Medicine | Admitting: Internal Medicine

## 2014-04-29 ENCOUNTER — Other Ambulatory Visit (HOSPITAL_BASED_OUTPATIENT_CLINIC_OR_DEPARTMENT_OTHER): Payer: Medicare Other

## 2014-04-29 ENCOUNTER — Other Ambulatory Visit: Payer: Self-pay | Admitting: Internal Medicine

## 2014-04-29 DIAGNOSIS — C3490 Malignant neoplasm of unspecified part of unspecified bronchus or lung: Secondary | ICD-10-CM | POA: Diagnosis not present

## 2014-04-29 DIAGNOSIS — N281 Cyst of kidney, acquired: Secondary | ICD-10-CM | POA: Insufficient documentation

## 2014-04-29 DIAGNOSIS — C3412 Malignant neoplasm of upper lobe, left bronchus or lung: Secondary | ICD-10-CM

## 2014-04-29 DIAGNOSIS — R918 Other nonspecific abnormal finding of lung field: Secondary | ICD-10-CM | POA: Diagnosis not present

## 2014-04-29 DIAGNOSIS — Z923 Personal history of irradiation: Secondary | ICD-10-CM | POA: Insufficient documentation

## 2014-04-29 DIAGNOSIS — I700 Atherosclerosis of aorta: Secondary | ICD-10-CM | POA: Insufficient documentation

## 2014-04-29 DIAGNOSIS — K449 Diaphragmatic hernia without obstruction or gangrene: Secondary | ICD-10-CM | POA: Diagnosis not present

## 2014-04-29 DIAGNOSIS — Z902 Acquired absence of lung [part of]: Secondary | ICD-10-CM | POA: Insufficient documentation

## 2014-04-29 LAB — CBC WITH DIFFERENTIAL/PLATELET
BASO%: 0.8 % (ref 0.0–2.0)
Basophils Absolute: 0.1 10*3/uL (ref 0.0–0.1)
EOS%: 23.9 % — AB (ref 0.0–7.0)
Eosinophils Absolute: 2.2 10*3/uL — ABNORMAL HIGH (ref 0.0–0.5)
HCT: 37 % (ref 34.8–46.6)
HGB: 11.5 g/dL — ABNORMAL LOW (ref 11.6–15.9)
LYMPH#: 2 10*3/uL (ref 0.9–3.3)
LYMPH%: 22 % (ref 14.0–49.7)
MCH: 26.7 pg (ref 25.1–34.0)
MCHC: 31.1 g/dL — AB (ref 31.5–36.0)
MCV: 85.8 fL (ref 79.5–101.0)
MONO#: 1 10*3/uL — ABNORMAL HIGH (ref 0.1–0.9)
MONO%: 10.9 % (ref 0.0–14.0)
NEUT#: 3.8 10*3/uL (ref 1.5–6.5)
NEUT%: 42.4 % (ref 38.4–76.8)
Platelets: 240 10*3/uL (ref 145–400)
RBC: 4.31 10*6/uL (ref 3.70–5.45)
RDW: 13.7 % (ref 11.2–14.5)
WBC: 9.1 10*3/uL (ref 3.9–10.3)

## 2014-04-29 LAB — COMPREHENSIVE METABOLIC PANEL (CC13)
ALT: 23 U/L (ref 0–55)
ANION GAP: 9 meq/L (ref 3–11)
AST: 22 U/L (ref 5–34)
Albumin: 3.5 g/dL (ref 3.5–5.0)
Alkaline Phosphatase: 58 U/L (ref 40–150)
BUN: 24 mg/dL (ref 7.0–26.0)
CO2: 26 meq/L (ref 22–29)
Calcium: 9.8 mg/dL (ref 8.4–10.4)
Chloride: 108 mEq/L (ref 98–109)
Creatinine: 1.7 mg/dL — ABNORMAL HIGH (ref 0.6–1.1)
Glucose: 101 mg/dl (ref 70–140)
POTASSIUM: 3.9 meq/L (ref 3.5–5.1)
SODIUM: 143 meq/L (ref 136–145)
TOTAL PROTEIN: 6.3 g/dL — AB (ref 6.4–8.3)
Total Bilirubin: 0.28 mg/dL (ref 0.20–1.20)

## 2014-05-01 ENCOUNTER — Ambulatory Visit (HOSPITAL_BASED_OUTPATIENT_CLINIC_OR_DEPARTMENT_OTHER): Payer: Medicare Other | Admitting: Internal Medicine

## 2014-05-01 ENCOUNTER — Telehealth: Payer: Self-pay | Admitting: Internal Medicine

## 2014-05-01 ENCOUNTER — Encounter: Payer: Self-pay | Admitting: Internal Medicine

## 2014-05-01 VITALS — BP 146/61 | HR 65 | Temp 97.9°F | Resp 18 | Ht 65.0 in | Wt 159.0 lb

## 2014-05-01 DIAGNOSIS — C3412 Malignant neoplasm of upper lobe, left bronchus or lung: Secondary | ICD-10-CM | POA: Diagnosis not present

## 2014-05-01 DIAGNOSIS — M5126 Other intervertebral disc displacement, lumbar region: Secondary | ICD-10-CM | POA: Diagnosis not present

## 2014-05-01 NOTE — Telephone Encounter (Signed)
Pt confirmed labs/ov per 10/22 POF, gave pt AVS......Marland Kitchen KJ

## 2014-05-01 NOTE — Progress Notes (Signed)
[place] [phone]   [phone]  OFFICE PROGRESS NOTE  [name], MD No address on file  PRINCIPAL DIAGNOSES:  1. 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 The patient presented at that time with a right upper lobe lesion, as well as suspicious ground-glass opacities in the left lung. The tumor was negative for EGFR mutation and negative for ALK gene translocation. Veristrat test Good. EGFR mutation studies were conflicting, initial test was negative, second test was positive for EGFR mutation in exon 18 and the the test from [facility] one was negative with positive KRAS mutation in Exon 12. 2. Stage IA malignant melanoma, status post wide excision by Dr. Allyson Sabal on Nov 12, 2008.  PRIOR THERAPY:  1) Status post right upper lobectomy with lymph node dissection under the care of Dr. Arlyce Dice on May 21, 2008.  2) Stereotactic radiotherapy to the left upper lobe lung nodule under the care of Dr. Pablo Ledger expected to be completed on 10/21/2011.  3) stereotactic radiotherapy to the left prevascular lymphadenopathy under the care of Dr. Pablo Ledger completed on 09/26/2013. 4) Tarceva 150 mg by mouth daily started 11/29/2013 discontinued today secondary to intolerance with persistent diarrhea and fatigue. 5) Tarceva 100 mg by mouth daily started on 12/27/2013. Discontinued one week after treatment because of intolerance.  CURRENT THERAPY: None  Advanced directives: The patient has advanced directive and no change is requested.  INTERVAL HISTORY: [name] 73 y.o. female returns to the clinic today for follow up visit accompanied by her husband. The patient is feeling much better today. She has persistent low back pain since if and she was seen recently by Dr. Patrice Paradise who ordered MRI of the lumbar spine and showed degenerative disease  in that area. She is expected to have steroid injection to that area soon. She denied having any significant weight loss or night sweats. The patient denied having any fever or chills. She has no nausea or vomiting. She denied having any significant chest pain or hemoptysis. She had repeat CT scan of the chest performed recently and she is here for evaluation and discussion of her scan results.  MEDICAL HISTORY: Past Medical History  Diagnosis Date  . LBBB (left bundle branch block)   . Hypertension   . Hyperthyroidism     dr Loanne Drilling- radioactive iodine treatment 2011- resolved  . Tremor     hand- intention tremor  . Complication of anesthesia     difficulty awakening, dysrhythmia post surgery  . lung ca dx'd 07/2008    lung- right non small cell, adenocarcinoma w/ broncho- alveolar features  . Melanoma     left leg  . Cervical ca dx'd 1988    surg only  . Radiation 10/11/11-10/21/11    Left upper lobe adenocarcinoma  . Hx of radiation therapy 09/16/13-09/30/13    prevascular lymph node    ALLERGIES:  is allergic to meperidine hcl; morphine; penicillins; oxycodone-acetaminophen; propoxyphene n-acetaminophen; and tramadol hcl.  MEDICATIONS:  Current Outpatient Prescriptions  Medication Sig Dispense Refill  . ANGELIQ 0.5-1 MG per tablet TAKE 1 TABLET BY MOUTH EVERY DAY  28 tablet  11  . Aspirin-Salicylamide-Caffeine (BC HEADACHE POWDER PO) Take 1 packet by mouth 3 (three) times daily.       Marland Kitchen atenolol (TENORMIN) 25 MG tablet TAKE 1 TABLET BY MOUTH TWICE DAILY  180 tablet  0  . calcium-vitamin D (  OSCAL WITH D) 500-200 MG-UNIT per tablet Take 1 tablet by mouth daily.       . celecoxib (CELEBREX) 200 MG capsule Take 200 mg by mouth daily as needed. For pain      . diltiazem (DILACOR XR) 240 MG 24 hr capsule Take 240 mg by mouth daily.      . fluocinonide cream (LIDEX) 4.12 % Apply 1 application topically 2 (two) times daily.      . hydrochlorothiazide (HYDRODIURIL) 25 MG tablet TAKE 1 TABLET  BY MOUTH EVERY MORNING  30 tablet  3  . Hydrocortisone (CORTIZONE-10 EX) Apply topically.      . hydrOXYzine (ATARAX/VISTARIL) 10 MG tablet Take 10 mg by mouth every 4 (four) hours as needed.      Marland Kitchen Ketoconazole-Hydrocortisone 2 & 1 % (CREAM) KIT Apply topically.      Marland Kitchen loperamide (IMODIUM) 2 MG capsule Take 2 mg by mouth daily as needed for diarrhea or loose stools.      . miconazole (MONISTAT 7) 100 MG vaginal suppository Place 100 mg vaginally at bedtime.      . Multiple Vitamin (MULITIVITAMIN WITH MINERALS) TABS Take 1 tablet by mouth daily.      . Multiple Vitamins-Minerals (PRESERVISION AREDS 2 PO) Take 2 tablets by mouth daily.      . Probiotic Product (PROBIOTIC DAILY PO) Take by mouth.      . prochlorperazine (COMPAZINE) 10 MG tablet Take 1 tablet (10 mg total) by mouth every 6 (six) hours as needed for nausea or vomiting.  60 tablet  0  . tioconazole (MONISTAT 1) 6.5 % vaginal ointment Place 1 applicator vaginally once.       No current facility-administered medications for this visit.    SURGICAL HISTORY:  Past Surgical History  Procedure Laterality Date  . Tonsillectomy    . Conization for cervical dysplasia    . Rul lobectomy  2010  . Esophogus stretching      REVIEW OF SYSTEMS:  A comprehensive review of systems was negative except for: Musculoskeletal: positive for back pain   PHYSICAL EXAMINATION: General appearance: alert, cooperative and no distress Head: Normocephalic, without obvious abnormality, atraumatic Neck: no adenopathy Lymph nodes: Cervical, supraclavicular, and axillary nodes normal. Resp: clear to auscultation bilaterally Cardio: regular rate and rhythm, S1, S2 normal, no murmur, click, rub or gallop GI: soft, non-tender; bowel sounds normal; no masses,  no organomegaly Extremities: extremities normal, atraumatic, no cyanosis or edema Neurologic: Alert and oriented X 3, normal strength and tone. Normal symmetric reflexes. Normal coordination and  gait  ECOG PERFORMANCE STATUS: 1 - Symptomatic but completely ambulatory  Blood pressure 146/61, pulse 65, temperature 97.9 F (36.6 C), temperature source Oral, resp. rate 18, height $RemoveBe'5\' 5"'cNoKcKjHQ$  (1.651 m), weight 159 lb (72.122 kg), SpO2 100.00%.  LABORATORY DATA: Lab Results  Component Value Date   WBC 9.1 04/29/2014   HGB 11.5* 04/29/2014   HCT 37.0 04/29/2014   MCV 85.8 04/29/2014   PLT 240 04/29/2014      Chemistry      Component Value Date/Time   NA 143 04/29/2014 1010   NA 140 08/16/2013 2138   NA 137 01/16/2012 0941   K 3.9 04/29/2014 1010   K 3.4* 08/16/2013 2138   K 4.1 01/16/2012 0941   CL 99 08/16/2013 2138   CL 105 07/17/2012 1434   CL 100 01/16/2012 0941   CO2 26 04/29/2014 1010   CO2 24 08/16/2013 2138   CO2 28 01/16/2012 0941  BUN 24.0 04/29/2014 1010   BUN 23 08/16/2013 2138   BUN 20 01/16/2012 0941   CREATININE 1.7* 04/29/2014 1010   CREATININE 1.27* 08/16/2013 2138   CREATININE 1.2 01/16/2012 0941      Component Value Date/Time   CALCIUM 9.8 04/29/2014 1010   CALCIUM 9.4 08/16/2013 2138   CALCIUM 9.1 01/16/2012 0941   ALKPHOS 58 04/29/2014 1010   ALKPHOS 46 01/16/2012 0941   ALKPHOS 50 10/18/2011 1415   AST 22 04/29/2014 1010   AST 23 01/16/2012 0941   AST 17 10/18/2011 1415   ALT 23 04/29/2014 1010   ALT 21 01/16/2012 0941   ALT 12 10/18/2011 1415   BILITOT 0.28 04/29/2014 1010   BILITOT 0.60 01/16/2012 0941   BILITOT 0.4 10/18/2011 1415       RADIOGRAPHIC STUDIES: Ct Chest Wo Contrast  04/29/2014   CLINICAL DATA:  Restaging lung cancer. History of right upper lobectomy in 2010 and stereotactic radiotherapy to the left upper lobe in 2013.  EXAM: CT CHEST WITHOUT CONTRAST  TECHNIQUE: Multidetector CT imaging of the chest was performed following the standard protocol without IV contrast.  COMPARISON:  Prior examinations 11/07/2013 and 01/24/2014. PET-CT 08/21/2013.  FINDINGS: Mediastinum: Small mediastinal lymph nodes are stable, not pathologically enlarged. There is no evidence of hilar  or axillary lymphadenopathy. The thyroid gland and trachea are stable. There is a stable moderate size hiatal hernia. The heart size is normal. Mild to moderate diffuse atherosclerosis of the aorta, great vessels and coronary arteries is noted.  Lungs/Pleura: There is no pleural or pericardial effusion.Spiculated left upper lobe nodule has enlarged, measuring 10 x 9 mm on image 15 (previously 8 x 7 mm). This appears nodular in all 3 planes. The more central density extending superiorly from the left hilum has not significantly changed, appearing linear on the reformatted images and likely the sequela of radiation therapy. I do not see a well-defined mass in this region. The ill-defined sub solid lesion more inferiorly in the lingula on image 21 is stable. Likewise, the ill-defined ground-glass density anteriorly in the left lower lobe on image 24 stable. There are stable small ground-glass densities throughout the right lung.  Upper abdomen: Stable appearance. No evidence adrenal mass. There is a stable exophytic right renal cyst.  Musculoskeletal/Chest wall: No evidence of chest wall lesion or suspicious osseous finding.  IMPRESSION: 1. The dominant spiculated left upper lobe suprahilar nodule has enlarged, worrisome for tumor progression. 2. The more ill-defined densities more medially in the left upper lobe have not grossly changed, likely the sequela of radiation therapy. 3. Additional scattered ground-glass in sub solid densities in both lungs are stable. 4. Stable small mediastinal lymph nodes, including a treated pre-vascular hypermetabolic node.   Electronically Signed   By: Camie Patience M.D.   On: 04/29/2014 14:16   Mr Lumbar Spine Wo Contrast  04/09/2014   CLINICAL DATA:  Low back pain. Pain worse on the RIGHT. No radicular symptoms. History of lung cancer. Bilateral low back pain without sciatica.  EXAM: MRI LUMBAR SPINE WITHOUT CONTRAST  TECHNIQUE: Multiplanar, multisequence MR imaging of the lumbar  spine was performed. No intravenous contrast was administered.  COMPARISON:  PET-CT 08/21/2013.  FINDINGS: Segmentation: The numbering convention used for this exam termed L5-S1 as the last intervertebral disc space.  Alignment: Dextroconvex curve with the apex at L2. Levoconvex compensatory curve with the apex at L4-L5. 5 mm of retrolisthesis of L1 on L2 and L2 on L3. 6 mm grade I anterolisthesis  of L4 on L5. The spondylolisthesis appears degenerative in associated with collapse of the disc spaces.  Vertebrae: Discogenic degenerative endplate changes. No compression fracture or aggressive appearing osseous lesions. Benign hemangioma present at L2.  In the LEFT sacral ala, there is a 2.3 cm T2 hyperintense lesion seen on the sagittal and coronal images. Comparing to the prior PET-CT, this probably represents an atypical hemangioma.  Conus medullaris: Normal termination at L1.  Paraspinal tissues: Exophytic RIGHT upper pole renal cyst with low signal posteriorly likely representing layering milk of calcium. LEFT inferior pole renal cyst.  Disc levels:  Diffuse disc degeneration and desiccation. Disc degeneration is severe.  T11-T12:  Sagittal imaging.  Schmorl's nodes.  No stenosis.  T12-L1: Disc desiccation.  No stenosis.  L1-L2: Severe disc degeneration. Minimal central stenosis. LEFT central and subarticular zone disc extrusion producing mild central stenosis. Moderate LEFT foraminal stenosis is also present. RIGHT foramen patent.  L2-L3: Severe disc degeneration. Mild central stenosis associated with LEFT eccentric broad-based disc bulging. Moderate LEFT foraminal stenosis. RIGHT foramen patent.  L3-L4: Disc desiccation. Broad-based posterior disc bulging and posterior ligamentum flavum redundancy produce mild to moderate central stenosis. Bilateral lateral recess stenosis is present. The neural foramina appear adequately patent.  L4-L5: Severe disc degeneration and facet arthrosis. Anterolisthesis, ligamentum  flavum redundancy and facet arthropathy along with uncoverage of the disc produce severe central stenosis with bilateral lateral recess stenosis. Severe RIGHT foraminal stenosis without LEFT foraminal stenosis.  L5-S1: Preserved disc height. Central canal and lateral recesses patent. Foramina patent.  IMPRESSION: 1. Indeterminate 2.3 cm LEFT sacral ala intraosseous lesion. This is favored to represent an atypical hemangioma however in this patient with history of lung cancer, consider a 2-3 month short-term follow-up MRI of the pelvis with and without contrast to assess for stability. Alternatively, a whole-body bone scan could be considered. 2. 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   Electronically Signed   By: Dereck Ligas M.D.   On: 04/09/2014 09:11   ASSESSMENT AND PLAN: This is a very pleasant 73 years old white female with recurrent non-small cell lung cancer, adenocarcinoma with conflicting reports regarding the EGFR mutation including the recent one from the biopsy of the left upper lobe lung nodule that showed mutation at exon 18. She recently completed a course of stereotactic radiotherapy to the left prevascular lymphadenopathy under the care of Dr. Pablo Ledger. She was recently treated with a course of Tarceva initially at 150 mg by mouth daily for one month followed by 1 week treatment at a reduced dose of 100 mg by mouth daily but unfortunately the patient was unable to tolerate her treatment and this was discontinued. Her recent CT scan showed no significant evidence for disease progression except for a slightly enlarged left upper lobe nodule. I discussed the scan results with the patient and her husband and showed them the images.  I gave her the option of resuming treatment with her Tarceva Or Iressa versus continuous observation and close monitoring. The patient would like to continue on observation especially with the upcoming holiday season. I  would see her back for followup visit in 3 months with repeat CT scan of the chest. She was advised to call immediately if she has any concerning symptoms in the interval. All questions were answered. The patient knows to call the clinic with any problems, questions or concerns. We can certainly see the patient much sooner if necessary.  Disclaimer: This note was dictated with voice  recognition software. Similar sounding words can inadvertently be transcribed and may not be corrected upon review.

## 2014-05-05 DIAGNOSIS — H3531 Nonexudative age-related macular degeneration: Secondary | ICD-10-CM | POA: Diagnosis not present

## 2014-05-07 ENCOUNTER — Encounter: Payer: Self-pay | Admitting: Cardiology

## 2014-05-07 ENCOUNTER — Ambulatory Visit (INDEPENDENT_AMBULATORY_CARE_PROVIDER_SITE_OTHER): Payer: Medicare Other | Admitting: Cardiology

## 2014-05-07 VITALS — BP 134/68 | HR 77 | Ht 65.0 in | Wt 163.0 lb

## 2014-05-07 DIAGNOSIS — R251 Tremor, unspecified: Secondary | ICD-10-CM

## 2014-05-07 DIAGNOSIS — I447 Left bundle-branch block, unspecified: Secondary | ICD-10-CM

## 2014-05-07 DIAGNOSIS — I10 Essential (primary) hypertension: Secondary | ICD-10-CM | POA: Diagnosis not present

## 2014-05-07 DIAGNOSIS — C3410 Malignant neoplasm of upper lobe, unspecified bronchus or lung: Secondary | ICD-10-CM | POA: Diagnosis not present

## 2014-05-07 DIAGNOSIS — I739 Peripheral vascular disease, unspecified: Secondary | ICD-10-CM

## 2014-05-07 DIAGNOSIS — I779 Disorder of arteries and arterioles, unspecified: Secondary | ICD-10-CM | POA: Diagnosis not present

## 2014-05-07 DIAGNOSIS — I700 Atherosclerosis of aorta: Secondary | ICD-10-CM

## 2014-05-07 MED ORDER — ATENOLOL 25 MG PO TABS
25.0000 mg | ORAL_TABLET | Freq: Two times a day (BID) | ORAL | Status: DC
Start: 2014-05-07 — End: 2015-07-30

## 2014-05-07 MED ORDER — DILTIAZEM HCL ER 240 MG PO CP24: 240.0000 mg | ORAL_CAPSULE | Freq: Every day | ORAL | Status: DC

## 2014-05-07 MED ORDER — HYDROCHLOROTHIAZIDE 25 MG PO TABS: 25.0000 mg | ORAL_TABLET | ORAL | Status: DC

## 2014-05-07 NOTE — Progress Notes (Signed)
Patient ID: [name], female   DOB: [birth year], 73 y.o.   MRN: [id number]    HPI   This is a special patient of mine that I have followed many years. Over time we have shared family stories about our children. She is an intelligent, perceptive woman.    Patient is seen to follow-up hypertension and a history of left bundle branch block. I have followed her for over 25 years with a history of left bundle branch block. Cardiac catheterization was done in the remote past. There was no significant coronary disease. She has a slow-growing lung cancer. Unfortunately it is continuing to grow and cause her difficulties. She had localized radiation to a no code. She has tried some oral medicines that she could not tolerate. She and her husband make decisions based on how the CT scans are progressing over time. One of the scans had shown some atherosclerosis in her aorta. She has no ischemic symptoms. Her LDL is over 100. I did recommend statin therapy. However the patient is hasn't as she often feels poorly with medications. Quality of life is the most important thing to her at this point. She does have carotid artery disease that is very mild. She does not need a follow-up Doppler this year. Her blood pressure stable.  Allergies  Allergen Reactions  . Meperidine Hcl Other (See Comments)    "knocked pt out for 4 days"  . Penicillins Other (See Comments)    Difficulty breathing  . Morphine Other (See Comments)    Stopped breathing due to too high of a dose per patient.  Not an allergic reaction to morphine  . Oxycodone-Acetaminophen Itching       . Propoxyphene N-Acetaminophen Nausea And Vomiting  . Tramadol Hcl Nausea And Vomiting    Current Outpatient Prescriptions  Medication Sig Dispense Refill  . Aspirin-Salicylamide-Caffeine (BC HEADACHE POWDER PO) Take 1 packet by mouth 3 (three) times daily.       Marland Kitchen atenolol (TENORMIN) 25 MG tablet Take 1 tablet (25 mg total) by mouth 2 (two) times  daily.  180 tablet  3  . calcium-vitamin D (OSCAL WITH D) 500-200 MG-UNIT per tablet Take 1 tablet by mouth daily.       . celecoxib (CELEBREX) 200 MG capsule Take 200 mg by mouth daily as needed. For pain      . diltiazem (DILACOR XR) 240 MG 24 hr capsule Take 1 capsule (240 mg total) by mouth daily.  90 capsule  3  . gabapentin (NEURONTIN) 100 MG capsule Take 300 mg by mouth 3 (three) times daily.      . hydrochlorothiazide (HYDRODIURIL) 25 MG tablet Take 1 tablet (25 mg total) by mouth every morning.  90 tablet  3  . Multiple Vitamin (MULITIVITAMIN WITH MINERALS) TABS Take 1 tablet by mouth daily.      . Multiple Vitamins-Minerals (PRESERVISION AREDS 2 PO) Take 2 tablets by mouth daily.      . prochlorperazine (COMPAZINE) 10 MG tablet Take 1 tablet (10 mg total) by mouth every 6 (six) hours as needed for nausea or vomiting.  60 tablet  0   No current facility-administered medications for this visit.    History   Social History  . Marital Status: Married    Spouse Name: N/A    Number of Children: 2  . Years of Education: N/A   Occupational History  . homemaker    Social History Main Topics  . Smoking status: Former Smoker -- 1.00  packs/day for 17 years    Types: Cigarettes    Quit date: 09/28/1975  . Smokeless tobacco: Never Used     Comment: 33 yrs ago  . Alcohol Use: Yes     Comment: glass wine daily  . Drug Use: No  . Sexual Activity: Not on file   Other Topics Concern  . Not on file   Social History Narrative   [employer] graduate   Married [phone]   4 grandchildren and 2 step-grandchildren   Marriage in good health            Physician Roster:   Oncologist- Dr Julien Nordmann   Surgeon- Dr Lindell Spar- Dr Mable Fill- Dr Allyson Sabal    Family History  Problem Relation Age of Onset  . Asthma Mother   . Heart disease Mother   . Thyroid disease Daughter     hypothyroidism  . Cancer Maternal Uncle     lung  . Cancer Maternal Grandfather     lung  . Diabetes Neg Hx   .  Coronary artery disease Neg Hx     Past Medical History  Diagnosis Date  . LBBB (left bundle branch block)   . Hypertension   . Hyperthyroidism     dr Loanne Drilling- radioactive iodine treatment 2011- resolved  . Tremor     hand- intention tremor  . Complication of anesthesia     difficulty awakening, dysrhythmia post surgery  . lung ca dx'd 07/2008    lung- right non small cell, adenocarcinoma w/ broncho- alveolar features  . Melanoma     left leg  . Cervical ca dx'd 1988    surg only  . Radiation 10/11/11-10/21/11    Left upper lobe adenocarcinoma  . Hx of radiation therapy 09/16/13-09/30/13    prevascular lymph node    Past Surgical History  Procedure Laterality Date  . Tonsillectomy    . Conization for cervical dysplasia    . Rul lobectomy  2010  . Esophogus stretching      Patient Active Problem List   Diagnosis Date Noted  . Carotid artery disease without cerebral infarction 05/20/2013  . Atherosclerosis of aorta 05/10/2013  . Macular degeneration 05/10/2013  . Allergic rhinitis 10/20/2012  . Cancer of upper lobe of lung 09/28/2011  . Melanoma   . LBBB (left bundle branch block)   . Gastritis due to nonsteroidal anti-inflammatory drug 06/20/2011  . Hyperthyroidism   . Tremor   . Abdominal pain, RUQ 02/10/2011  . GERD with stricture 02/08/2011  . GOITER, MULTINODULAR 09/25/2009  . URI 09/02/2009  . ESSENTIAL HYPERTENSION, BENIGN 12/01/2008    ROS   Patient denies fever, chills, headache, sweats, rash, change in vision, change in hearing, chest pain, cough, nausea or vomiting, urinary symptoms. All other systems are reviewed and are negative.  PHYSICAL EXAM   Patient is stable today. She is oriented to person time and place. Affect is normal. Head is atraumatic. Sclera and conjunctiva are normal. There is no jugular venous distention. Lungs are clear. Respiratory effort is not labored. Cardiac exam reveals an S1 and S2. The abdomen is soft. There is no peripheral edema.  There are no musculoskeletal deformities. There are no skin rashes.  Filed Vitals:   05/07/14 1114  BP: 134/68  Pulse: 77  Height: 5\' 5"  (1.651 m)  Weight: 163 lb (73.936 kg)  SpO2: 97%     ASSESSMENT & PLAN

## 2014-05-07 NOTE — Assessment & Plan Note (Addendum)
In one of her chest CT reports there is mention of atherosclerosis in her aorta. She has no proven coronary disease. I have recommended statin therapy. She has chosen not to take a statin as she often feels poorly with medicines. At this time quality-of-life is most important to her.  The patient is aware that I will be retiring in September, 2016. Her yearly follow-up will not be until next October. Her cardiology issues are really very limited but she does want an ongoing cardiologist. She knows that I will be asking Dr. Dorris Carnes to follow her after I retire.

## 2014-05-07 NOTE — Assessment & Plan Note (Signed)
Her tremor is very limited at this time.

## 2014-05-07 NOTE — Assessment & Plan Note (Signed)
There is mild carotid artery disease. She does not need a follow-up Doppler this year.

## 2014-05-07 NOTE — Assessment & Plan Note (Signed)
Her lung cancer is significant. The diagnosis has been known for several years. However there has been progression.

## 2014-05-07 NOTE — Patient Instructions (Signed)
**Note De-identified [name] Obfuscation** Your physician recommends that you continue on your current medications as directed. Please refer to the Current Medication list given to you today.  Your physician wants you to follow-up in: 1 year. You will receive a reminder letter in the mail two months in advance. If you don't receive a letter, please call our office to schedule the follow-up appointment.  

## 2014-05-07 NOTE — Assessment & Plan Note (Signed)
Left bundle branch block is old. No further workup.

## 2014-05-07 NOTE — Assessment & Plan Note (Signed)
Her blood pressure is controlled. No change in therapy.

## 2014-05-08 DIAGNOSIS — M5126 Other intervertebral disc displacement, lumbar region: Secondary | ICD-10-CM | POA: Diagnosis not present

## 2014-05-09 DIAGNOSIS — M5126 Other intervertebral disc displacement, lumbar region: Secondary | ICD-10-CM | POA: Diagnosis not present

## 2014-05-12 DIAGNOSIS — M5126 Other intervertebral disc displacement, lumbar region: Secondary | ICD-10-CM | POA: Diagnosis not present

## 2014-05-14 DIAGNOSIS — M5126 Other intervertebral disc displacement, lumbar region: Secondary | ICD-10-CM | POA: Diagnosis not present

## 2014-05-15 DIAGNOSIS — M5126 Other intervertebral disc displacement, lumbar region: Secondary | ICD-10-CM | POA: Diagnosis not present

## 2014-05-20 DIAGNOSIS — H43813 Vitreous degeneration, bilateral: Secondary | ICD-10-CM | POA: Diagnosis not present

## 2014-05-20 DIAGNOSIS — D3131 Benign neoplasm of right choroid: Secondary | ICD-10-CM | POA: Diagnosis not present

## 2014-05-20 DIAGNOSIS — H3531 Nonexudative age-related macular degeneration: Secondary | ICD-10-CM | POA: Diagnosis not present

## 2014-05-22 DIAGNOSIS — M5126 Other intervertebral disc displacement, lumbar region: Secondary | ICD-10-CM | POA: Diagnosis not present

## 2014-05-26 DIAGNOSIS — M5126 Other intervertebral disc displacement, lumbar region: Secondary | ICD-10-CM | POA: Diagnosis not present

## 2014-06-02 DIAGNOSIS — M5126 Other intervertebral disc displacement, lumbar region: Secondary | ICD-10-CM | POA: Diagnosis not present

## 2014-06-27 DIAGNOSIS — M79671 Pain in right foot: Secondary | ICD-10-CM | POA: Diagnosis not present

## 2014-07-01 DIAGNOSIS — Z6826 Body mass index (BMI) 26.0-26.9, adult: Secondary | ICD-10-CM | POA: Diagnosis not present

## 2014-07-01 DIAGNOSIS — M47816 Spondylosis without myelopathy or radiculopathy, lumbar region: Secondary | ICD-10-CM | POA: Diagnosis not present

## 2014-07-01 DIAGNOSIS — M5126 Other intervertebral disc displacement, lumbar region: Secondary | ICD-10-CM | POA: Diagnosis not present

## 2014-07-01 DIAGNOSIS — M545 Low back pain: Secondary | ICD-10-CM | POA: Diagnosis not present

## 2014-07-30 ENCOUNTER — Ambulatory Visit (HOSPITAL_COMMUNITY)
Admission: RE | Admit: 2014-07-30 | Discharge: 2014-07-30 | Disposition: A | Discharge status: Home / Self Care | Payer: Medicare Other | Source: Ambulatory Visit | Attending: Internal Medicine | Admitting: Internal Medicine

## 2014-07-30 ENCOUNTER — Encounter (HOSPITAL_COMMUNITY): Payer: Self-pay

## 2014-07-30 ENCOUNTER — Other Ambulatory Visit (HOSPITAL_BASED_OUTPATIENT_CLINIC_OR_DEPARTMENT_OTHER): Payer: Medicare Other

## 2014-07-30 DIAGNOSIS — Z902 Acquired absence of lung [part of]: Secondary | ICD-10-CM | POA: Diagnosis not present

## 2014-07-30 DIAGNOSIS — Z9221 Personal history of antineoplastic chemotherapy: Secondary | ICD-10-CM | POA: Insufficient documentation

## 2014-07-30 DIAGNOSIS — Z08 Encounter for follow-up examination after completed treatment for malignant neoplasm: Secondary | ICD-10-CM | POA: Insufficient documentation

## 2014-07-30 DIAGNOSIS — C3412 Malignant neoplasm of upper lobe, left bronchus or lung: Secondary | ICD-10-CM | POA: Diagnosis not present

## 2014-07-30 DIAGNOSIS — C3490 Malignant neoplasm of unspecified part of unspecified bronchus or lung: Secondary | ICD-10-CM

## 2014-07-30 DIAGNOSIS — Z8541 Personal history of malignant neoplasm of cervix uteri: Secondary | ICD-10-CM | POA: Diagnosis not present

## 2014-07-30 DIAGNOSIS — R911 Solitary pulmonary nodule: Secondary | ICD-10-CM | POA: Insufficient documentation

## 2014-07-30 LAB — COMPREHENSIVE METABOLIC PANEL (CC13)
ALBUMIN: 3.5 g/dL (ref 3.5–5.0)
ALT: 15 U/L (ref 0–55)
ANION GAP: 8 meq/L (ref 3–11)
AST: 16 U/L (ref 5–34)
Alkaline Phosphatase: 66 U/L (ref 40–150)
BUN: 27.5 mg/dL — ABNORMAL HIGH (ref 7.0–26.0)
CALCIUM: 9.3 mg/dL (ref 8.4–10.4)
CHLORIDE: 104 meq/L (ref 98–109)
CO2: 29 meq/L (ref 22–29)
CREATININE: 1.4 mg/dL — AB (ref 0.6–1.1)
EGFR: 37 mL/min/{1.73_m2} — ABNORMAL LOW (ref >90)
Glucose: 95 mg/dl (ref 70–140)
Potassium: 4.1 mEq/L (ref 3.5–5.1)
Sodium: 141 mEq/L (ref 136–145)
Total Bilirubin: 0.26 mg/dL (ref 0.20–1.20)
Total Protein: 5.9 g/dL — ABNORMAL LOW (ref 6.4–8.3)

## 2014-07-30 LAB — CBC WITH DIFFERENTIAL/PLATELET
BASO%: 1.2 % (ref 0.0–2.0)
Basophils Absolute: 0.1 10*3/uL (ref 0.0–0.1)
EOS%: 22 % — AB (ref 0.0–7.0)
Eosinophils Absolute: 1.6 10*3/uL — ABNORMAL HIGH (ref 0.0–0.5)
HCT: 36.6 % (ref 34.8–46.6)
HGB: 11 g/dL — ABNORMAL LOW (ref 11.6–15.9)
LYMPH%: 25.1 % (ref 14.0–49.7)
MCH: 24.2 pg — ABNORMAL LOW (ref 25.1–34.0)
MCHC: 30.1 g/dL — ABNORMAL LOW (ref 31.5–36.0)
MCV: 80.4 fL (ref 79.5–101.0)
MONO#: 0.9 10*3/uL (ref 0.1–0.9)
MONO%: 12.5 % (ref 0.0–14.0)
NEUT%: 39.2 % (ref 38.4–76.8)
NEUTROS ABS: 2.9 10*3/uL (ref 1.5–6.5)
Platelets: 215 10*3/uL (ref 145–400)
RBC: 4.55 10*6/uL (ref 3.70–5.45)
RDW: 16.3 % — AB (ref 11.2–14.5)
WBC: 7.4 10*3/uL (ref 3.9–10.3)
lymph#: 1.9 10*3/uL (ref 0.9–3.3)

## 2014-07-30 MED ORDER — IOHEXOL 300 MG/ML  SOLN
80.0000 mL | Freq: Once | INTRAMUSCULAR | Status: AC | PRN
  Administered 2014-07-30: 60 mL via INTRAVENOUS

## 2014-07-31 DIAGNOSIS — C44629 Squamous cell carcinoma of skin of left upper limb, including shoulder: Secondary | ICD-10-CM | POA: Diagnosis not present

## 2014-07-31 DIAGNOSIS — L570 Actinic keratosis: Secondary | ICD-10-CM | POA: Diagnosis not present

## 2014-07-31 DIAGNOSIS — D0461 Carcinoma in situ of skin of right upper limb, including shoulder: Secondary | ICD-10-CM | POA: Diagnosis not present

## 2014-08-04 ENCOUNTER — Ambulatory Visit (HOSPITAL_BASED_OUTPATIENT_CLINIC_OR_DEPARTMENT_OTHER): Payer: Medicare Other | Admitting: Internal Medicine

## 2014-08-04 ENCOUNTER — Encounter: Payer: Self-pay | Admitting: Internal Medicine

## 2014-08-04 ENCOUNTER — Telehealth: Payer: Self-pay | Admitting: Internal Medicine

## 2014-08-04 ENCOUNTER — Ambulatory Visit (HOSPITAL_BASED_OUTPATIENT_CLINIC_OR_DEPARTMENT_OTHER): Payer: Medicare Other

## 2014-08-04 VITALS — BP 107/48 | HR 64 | Temp 97.5°F | Resp 18 | Ht 65.0 in | Wt 163.6 lb

## 2014-08-04 DIAGNOSIS — Z23 Encounter for immunization: Secondary | ICD-10-CM

## 2014-08-04 DIAGNOSIS — C3412 Malignant neoplasm of upper lobe, left bronchus or lung: Secondary | ICD-10-CM

## 2014-08-04 MED ORDER — INFLUENZA VAC SPLIT QUAD 0.5 ML IM SUSY
0.5000 mL | PREFILLED_SYRINGE | Freq: Once | INTRAMUSCULAR | Status: AC
  Administered 2014-08-04: 0.5 mL via INTRAMUSCULAR
  Filled 2014-08-04: qty 0.5

## 2014-08-04 NOTE — Progress Notes (Signed)
[place] [phone]   [phone]  OFFICE PROGRESS NOTE  [name], MD No address on file  PRINCIPAL DIAGNOSES:  1. 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 The patient presented at that time with a right upper lobe lesion, as well as suspicious ground-glass opacities in the left lung. The tumor was negative for EGFR mutation and negative for ALK gene translocation. Veristrat test Good. EGFR mutation studies were conflicting, initial test was negative, second test was positive for EGFR mutation in exon 18 and the the test from [facility] one was negative with positive KRAS mutation in Exon 12. 2. Stage IA malignant melanoma, status post wide excision by Dr. Allyson Sabal on Nov 12, 2008.  PRIOR THERAPY:  1) Status post right upper lobectomy with lymph node dissection under the care of Dr. Arlyce Dice on May 21, 2008.  2) Stereotactic radiotherapy to the left upper lobe lung nodule under the care of Dr. Pablo Ledger expected to be completed on 10/21/2011.  3) stereotactic radiotherapy to the left prevascular lymphadenopathy under the care of Dr. Pablo Ledger completed on 09/26/2013. 4) Tarceva 150 mg by mouth daily started 11/29/2013 discontinued today secondary to intolerance with persistent diarrhea and fatigue. 5) Tarceva 100 mg by mouth daily started on 12/27/2013. Discontinued one week after treatment because of intolerance.  CURRENT THERAPY: None  Advanced directives: The patient has advanced directive and no change is requested.  INTERVAL HISTORY: [name] 74 y.o. female returns to the clinic today for follow up visit accompanied by her husband. The patient is feeling much better today except for persistent low back pain. She was seen by Dr.Cohen and treated with steroid injection which initially helped her symptoms but not much  improvement after the second injection. She denied having any significant weight loss or night sweats. The patient denied having any fever or chills. She has no nausea or vomiting. She denied having any significant chest pain or hemoptysis. She had repeat CT scan of the chest performed recently and she is here for evaluation and discussion of her scan results.  MEDICAL HISTORY: Past Medical History  Diagnosis Date  . LBBB (left bundle branch block)   . Hypertension   . Hyperthyroidism     dr Loanne Drilling- radioactive iodine treatment 2011- resolved  . Tremor     hand- intention tremor  . Complication of anesthesia     difficulty awakening, dysrhythmia post surgery  . lung ca dx'd 07/2008    lung- right non small cell, adenocarcinoma w/ broncho- alveolar features  . Melanoma     left leg  . Cervical ca dx'd 1988    surg only  . Radiation 10/11/11-10/21/11    Left upper lobe adenocarcinoma  . Hx of radiation therapy 09/16/13-09/30/13    prevascular lymph node    ALLERGIES:  is allergic to meperidine hcl; penicillins; morphine; oxycodone-acetaminophen; propoxyphene n-acetaminophen; and tramadol hcl.  MEDICATIONS:  Current Outpatient Prescriptions  Medication Sig Dispense Refill  . atenolol (TENORMIN) 25 MG tablet Take 1 tablet (25 mg total) by mouth 2 (two) times daily. 180 tablet 3  . calcium-vitamin D (OSCAL WITH D) 500-200 MG-UNIT per tablet Take 1 tablet by mouth daily.     . celecoxib (CELEBREX) 200 MG capsule Take 200 mg by mouth daily as needed. For pain    . diltiazem (DILACOR XR) 240 MG 24 hr capsule Take 1 capsule (240  mg total) by mouth daily. 90 capsule 3  . gabapentin (NEURONTIN) 300 MG capsule Take 300 mg by mouth 3 (three) times daily.  2  . hydrochlorothiazide (HYDRODIURIL) 25 MG tablet Take 1 tablet (25 mg total) by mouth every morning. 90 tablet 3  . Multiple Vitamin (MULITIVITAMIN WITH MINERALS) TABS Take 1 tablet by mouth daily.    . Multiple Vitamins-Minerals (PRESERVISION  AREDS 2 PO) Take 2 tablets by mouth daily.    . Aspirin-Salicylamide-Caffeine (BC HEADACHE POWDER PO) Take 1 packet by mouth 3 (three) times daily.     . prochlorperazine (COMPAZINE) 10 MG tablet Take 1 tablet (10 mg total) by mouth every 6 (six) hours as needed for nausea or vomiting. 60 tablet 0  . tiZANidine (ZANAFLEX) 4 MG tablet Take 4 mg by mouth 3 (three) times daily.  1   Current Facility-Administered Medications  Medication Dose Route Frequency Provider Last Rate Last Dose  . Influenza vac split quadrivalent PF (FLUARIX) injection 0.5 mL  0.5 mL Intramuscular Once Curt Bears, MD        SURGICAL HISTORY:  Past Surgical History  Procedure Laterality Date  . Tonsillectomy    . Conization for cervical dysplasia    . Rul lobectomy  2010  . Esophogus stretching      REVIEW OF SYSTEMS:  A comprehensive review of systems was negative except for: Musculoskeletal: positive for back pain   PHYSICAL EXAMINATION: General appearance: alert, cooperative and no distress Head: Normocephalic, without obvious abnormality, atraumatic Neck: no adenopathy Lymph nodes: Cervical, supraclavicular, and axillary nodes normal. Resp: clear to auscultation bilaterally Cardio: regular rate and rhythm, S1, S2 normal, no murmur, click, rub or gallop GI: soft, non-tender; bowel sounds normal; no masses,  no organomegaly Extremities: extremities normal, atraumatic, no cyanosis or edema Neurologic: Alert and oriented X 3, normal strength and tone. Normal symmetric reflexes. Normal coordination and gait  ECOG PERFORMANCE STATUS: 1 - Symptomatic but completely ambulatory  Blood pressure 107/48, pulse 64, temperature 97.5 F (36.4 C), temperature source Oral, resp. rate 18, height '5\' 5"'  (1.651 m), weight 163 lb 9.6 oz (74.208 kg), SpO2 97 %.  LABORATORY DATA: Lab Results  Component Value Date   WBC 7.4 07/30/2014   HGB 11.0* 07/30/2014   HCT 36.6 07/30/2014   MCV 80.4 07/30/2014   PLT 215  07/30/2014      Chemistry      Component Value Date/Time   NA 141 07/30/2014 1033   NA 140 08/16/2013 2138   NA 137 01/16/2012 0941   K 4.1 07/30/2014 1033   K 3.4* 08/16/2013 2138   K 4.1 01/16/2012 0941   CL 99 08/16/2013 2138   CL 105 07/17/2012 1434   CL 100 01/16/2012 0941   CO2 29 07/30/2014 1033   CO2 24 08/16/2013 2138   CO2 28 01/16/2012 0941   BUN 27.5* 07/30/2014 1033   BUN 23 08/16/2013 2138   BUN 20 01/16/2012 0941   CREATININE 1.4* 07/30/2014 1033   CREATININE 1.27* 08/16/2013 2138   CREATININE 1.2 01/16/2012 0941      Component Value Date/Time   CALCIUM 9.3 07/30/2014 1033   CALCIUM 9.4 08/16/2013 2138   CALCIUM 9.1 01/16/2012 0941   ALKPHOS 66 07/30/2014 1033   ALKPHOS 46 01/16/2012 0941   ALKPHOS 50 10/18/2011 1415   AST 16 07/30/2014 1033   AST 23 01/16/2012 0941   AST 17 10/18/2011 1415   ALT 15 07/30/2014 1033   ALT 21 01/16/2012 0941   ALT  12 10/18/2011 1415   BILITOT 0.26 07/30/2014 1033   BILITOT 0.60 01/16/2012 0941   BILITOT 0.4 10/18/2011 1415       RADIOGRAPHIC STUDIES: Ct Chest W Contrast  07/30/2014   CLINICAL DATA:  Left upper lobe lung cancer diagnosed in 2013 with surgery only. Cervical cancer 18 years ago. Chemotherapy complete. Restaging. History of right upper lobectomy in 2010 and stereotactic radiotherapy to the left upper lobe in 2013.  EXAM: CT CHEST WITH CONTRAST  TECHNIQUE: Multidetector CT imaging of the chest was performed during intravenous contrast administration.  CONTRAST:  68m OMNIPAQUE IOHEXOL 300 MG/ML  SOLN  COMPARISON:  04/29/14  FINDINGS: Mediastinum/Nodes: No supraclavicular adenopathy. Moderate atherosclerosis at the origin of the left subclavian artery. Aortic atherosclerosis. Mild cardiomegaly with coronary artery atherosclerosis, including within the LAD.  Stable small middle mediastinal nodes. No hilar adenopathy. Moderate hiatal hernia.  Small prevascular node is unchanged at 6 mm.  Lungs/Pleura: Right upper  lobectomy. Scattered ground-glass and sub solid pulmonary nodules, primarily right-sided are similar. These all measure less than 1 cm.  Central left upper lobe pulmonary nodule measures 1.4 x 1.2 cm on image 15. 1.0 x 0.9 cm on the prior exam. Presumed radiation fibrosis throughout the remainder of the left upper lobe is similar.  Similar anterior left lower lobe ground-glass opacity at 1.7 cm on image 24.  Sub solid anterior left upper lobe 7 mm nodule on image 14 is similar.  No pleural fluid.  Upper abdomen: Probable cyst in the a left level liver, incompletely imaged. Normal imaged portions of the spleen, pancreas, gallbladder, biliary tract, adrenal glands. Too small to characterize upper pole left renal lesion. Upper pole right renal cyst.  Ectasia of the common hepatic artery at 1.0 cm on image 53. Similar.  Musculoskeletal: No acute osseous abnormality.  IMPRESSION: 1. Enlargement of a central left upper lobe pulmonary nodule, consistent with progressive disease. 2. Other ground-glass and sub solid pulmonary nodules are similar and indeterminate. 3. No new sites of disease identified. 4.  Atherosclerosis, including within the coronary arteries. 5. Moderate hiatal hernia. 6. Similar ectasia of the common hepatic artery.   Electronically Signed   By: KAbigail MiyamotoM.D.   On: 07/30/2014 14:38   ASSESSMENT AND PLAN: This is a very pleasant [age]years old white female with recurrent non-small cell lung cancer, adenocarcinoma with conflicting reports regarding the EGFR mutation including the recent one from the biopsy of the left upper lobe lung nodule that showed mutation at exon 18. She recently completed a course of stereotactic radiotherapy to the left prevascular lymphadenopathy under the care of Dr. WPablo Ledger She was recently treated with a course of Tarceva initially at 150 mg by mouth daily for one month followed by 1 week treatment at a reduced dose of 100 mg by mouth daily but unfortunately the  patient was unable to tolerate her treatment and this was discontinued. The recent CT scan of the chest showed evidence for mild enlargement of the left upper lobe lung nodule with stable disease in other areas. I discussed the scan results and showed the images to the patient and her husband. I gave her the option of resuming treatment with her Tarceva Or Iressa versus continuous observation and close monitoring. She also asked about stereotactic radiotherapy. I would refer the patient back to Dr. WPablo Ledgerfor evaluation to discuss this option. I'm not sure if the previous field of radiation is overlapping with the enlarging left upper lobe lung nodule. I also  discussed with the patient other options including referral to interventional radiology to see if she is a candidate for any other intervention like cryo or radiofrequency ablation. I would see the patient back for follow-up visit in 3 months with repeat CT scan of the chest but I'll be happy to see her sooner if she is interested in any systemic therapy.  She was advised to call immediately if she has any concerning symptoms in the interval. All questions were answered. The patient knows to call the clinic with any problems, questions or concerns. We can certainly see the patient much sooner if necessary.  Disclaimer: This note was dictated with voice recognition software. Similar sounding words can inadvertently be transcribed and may not be corrected upon review.

## 2014-08-04 NOTE — Telephone Encounter (Signed)
gv pt avs report for April and for appt with Dr. Pablo Ledger 08/07/14. [facility] will call pt re ct scan - pt aware.

## 2014-08-05 NOTE — Progress Notes (Signed)
Thoracic Location of Tumor / Histology:Recurrent non-small cell lung cancer initial diagnosis January 2010.  Tobacco/Marijuana/Snuff/ETOH use: Quit smoking 09/28/75 after 1 ppd over 17 years.  Past/Anticipated interventions by cardiothoracic surgery, if JEH:UDJSH upper lobe lobectomy 08/21/2008  Past/Anticipated interventions by medical oncology, if FWY:OVZCHYIFO tarceva at 150 mg and 100 mg, later discontinued secondary to intolerance.May to end of June 2015.Plan now is obseration with repeat ct scan and follow up with Dr.Mohamed in 3 months.  Signs/Symptoms  Weight changes, if any:No   Respiratory complaints, if any:No  Hemoptysis, if any:No  Pain issues, if any: No  SAFETY ISSUES:  Prior radiation? Sterotactic radiotherapy to left upper lobe 10/21/2011 and left lymphadenopathy on 09/26/13.  Pacemaker/ICD? No Possible current pregnancy? No                                                                                                                                                                                                                                                                                                                                                              Is the patient on methotrexate? No Current Complaints / other details:  07/30/2014:Ct chest reveals enlargement of central left upper lobe pulmonary nodule consistent with progressive disease.To discuss targeted lung therapy.  Allergies:meperidine, morphine, penicillin,oxycodone-acetaminophen, propoxyphene-acetaminophen and tramadol hcl.

## 2014-08-07 ENCOUNTER — Ambulatory Visit
Admission: RE | Admit: 2014-08-07 | Discharge: 2014-08-07 | Disposition: A | Discharge status: Home / Self Care | Payer: Medicare Other | Source: Ambulatory Visit | Attending: Radiation Oncology | Admitting: Radiation Oncology

## 2014-08-07 VITALS — BP 146/68 | HR 64 | Temp 97.6°F | Resp 18 | Wt 165.0 lb

## 2014-08-07 DIAGNOSIS — Z7982 Long term (current) use of aspirin: Secondary | ICD-10-CM | POA: Diagnosis not present

## 2014-08-07 DIAGNOSIS — C3412 Malignant neoplasm of upper lobe, left bronchus or lung: Secondary | ICD-10-CM

## 2014-08-07 DIAGNOSIS — M47897 Other spondylosis, lumbosacral region: Secondary | ICD-10-CM | POA: Diagnosis not present

## 2014-08-07 DIAGNOSIS — M5126 Other intervertebral disc displacement, lumbar region: Secondary | ICD-10-CM | POA: Diagnosis not present

## 2014-08-07 DIAGNOSIS — Z923 Personal history of irradiation: Secondary | ICD-10-CM | POA: Diagnosis not present

## 2014-08-07 DIAGNOSIS — M545 Low back pain: Secondary | ICD-10-CM | POA: Diagnosis not present

## 2014-08-07 NOTE — Addendum Note (Signed)
Encounter addended by: Arlyss Repress, RN on: 08/07/2014 11:20 AM<BR>     Documentation filed: Charges VN

## 2014-08-07 NOTE — Progress Notes (Signed)
Department of Radiation Oncology  Phone:  [phone] Fax:        [phone]   Name: [name] MRN: [id number]  DOB: 1940-08-19  Date: 08/07/2014  Follow Up Visit Note  Diagnosis: Multifocal bronchoalveolar carcinoma  Summary and Interval since last radiation: SBRT to a left upper lobe nodule to a total dose of 60 gray in 5 fractions completed April 2013. SBRT to a left mediastinal/prevascular ymph node 50 Gy completed March 2015  Interval History: [name] presents today for follow up. She is doing well and feeling well. She had a recent CT scan which shoed enlargement of the left upper lobe nodule.  She did not tolerate Tarceva. She requested this appointment to discuss whether radiation could be used to treat this area. (new nodule in pink with previous isodose lines overlayed on this diagnostic CT)    Allergies:  Allergies  Allergen Reactions  . Meperidine Hcl Other (See Comments)    "knocked pt out for 4 days"  . Penicillins Other (See Comments)    Difficulty breathing  . Morphine Other (See Comments)    Stopped breathing due to too high of a dose per patient.  Not an allergic reaction to morphine  . Oxycodone-Acetaminophen Itching       . Propoxyphene N-Acetaminophen Nausea And Vomiting  . Tramadol Hcl Nausea And Vomiting    Medications:  Current Outpatient Prescriptions  Medication Sig Dispense Refill  . Aspirin-Salicylamide-Caffeine (BC HEADACHE POWDER PO) Take 1 packet by mouth 3 (three) times daily.     Marland Kitchen atenolol (TENORMIN) 25 MG tablet Take 1 tablet (25 mg total) by mouth 2 (two) times daily. 180 tablet 3  . calcium-vitamin D (OSCAL WITH D) 500-200 MG-UNIT per tablet Take 1 tablet by mouth daily.     . celecoxib (CELEBREX) 200 MG capsule Take 200 mg by mouth daily as needed. For pain    . diltiazem (DILACOR XR) 240 MG 24 hr capsule Take 1 capsule (240 mg total) by mouth daily. 90 capsule 3  . gabapentin (NEURONTIN) 300 MG capsule Take 300 mg by mouth 3  (three) times daily.  2  . hydrochlorothiazide (HYDRODIURIL) 25 MG tablet Take 1 tablet (25 mg total) by mouth every morning. 90 tablet 3  . Multiple Vitamin (MULITIVITAMIN WITH MINERALS) TABS Take 1 tablet by mouth daily.    . Multiple Vitamins-Minerals (PRESERVISION AREDS 2 PO) Take 2 tablets by mouth daily.    . prochlorperazine (COMPAZINE) 10 MG tablet Take 1 tablet (10 mg total) by mouth every 6 (six) hours as needed for nausea or vomiting. 60 tablet 0  . tiZANidine (ZANAFLEX) 4 MG tablet Take 4 mg by mouth 3 (three) times daily.  1   No current facility-administered medications for this encounter.    Physical Exam:  Filed Vitals:   08/07/14 0916  BP: 146/68  Pulse: 64  Temp: 97.6 F (36.4 C)  Resp: 18  Weight: 165 lb (74.844 kg)  SpO2: 100%   pleasant female in no distress sitting comfortably on examining table. She appears younger than her stated age.  IMPRESSION: [name] is a 74 y.o. female status post SBRT x 2 to the left upper lobe  PLAN: We discussed her previous treatment and I showed her and her husband the composite plan of her cumulative radiation treatments to the left lung area. This nodule is right in the middle of her previous treatment fields. I feel repeat SBRT may lead her to increased complications of bronchoalveolar fistula or damage  to the aorta.  I am going to see if we can come up with a composite plan on her diagnostic CT just to see what the cumulative dose looks like. She is also going to request a consultl with interventional radiology from Dr. Julien Nordmann to discuss cryotherapy. She is leaving for [place] for 2 weeks and we will reconvene when she returns.   Jessica Silversmith, MD

## 2014-08-08 ENCOUNTER — Telehealth: Payer: Self-pay | Admitting: Medical Oncology

## 2014-08-08 NOTE — Telephone Encounter (Signed)
Pt requested appt with Mohamed week of feb 15th . She stated that " targeted radiation is not there". She wants to discuss other options with [facility]. Note to [place].

## 2014-08-11 ENCOUNTER — Other Ambulatory Visit: Payer: Self-pay | Admitting: Medical Oncology

## 2014-08-11 DIAGNOSIS — C3410 Malignant neoplasm of upper lobe, unspecified bronchus or lung: Secondary | ICD-10-CM

## 2014-08-28 DIAGNOSIS — T1502XA Foreign body in cornea, left eye, initial encounter: Secondary | ICD-10-CM | POA: Diagnosis not present

## 2014-08-29 ENCOUNTER — Telehealth: Payer: Self-pay | Admitting: Cardiology

## 2014-08-29 NOTE — Telephone Encounter (Signed)
New message ° ° ° ° ° °Returning a nurses call from yesterday °

## 2014-09-02 NOTE — Telephone Encounter (Signed)
**Note De-Identified Japleen Tornow Obfuscation** The pt states that a nurse called and spoke with her husband from Dr Kae Heller office but he did not remember the nurse's name.  He states that it was concerning a doppler. I checked the pts chart to try to find the nurse who called her or what they may have called her for but am unable to locate a reason for the call. The pt is advised that I did not call her and that I am unaware of why she was called. She verbalized understanding.

## 2014-09-03 ENCOUNTER — Telehealth: Payer: Self-pay | Admitting: Internal Medicine

## 2014-09-03 ENCOUNTER — Other Ambulatory Visit (HOSPITAL_BASED_OUTPATIENT_CLINIC_OR_DEPARTMENT_OTHER): Payer: Medicare Other

## 2014-09-03 ENCOUNTER — Ambulatory Visit (HOSPITAL_BASED_OUTPATIENT_CLINIC_OR_DEPARTMENT_OTHER): Payer: Medicare Other | Admitting: Internal Medicine

## 2014-09-03 ENCOUNTER — Encounter: Payer: Self-pay | Admitting: Internal Medicine

## 2014-09-03 VITALS — BP 157/68 | HR 66 | Temp 98.1°F | Resp 18 | Ht 65.0 in | Wt 162.7 lb

## 2014-09-03 DIAGNOSIS — M5126 Other intervertebral disc displacement, lumbar region: Secondary | ICD-10-CM | POA: Diagnosis not present

## 2014-09-03 DIAGNOSIS — M47816 Spondylosis without myelopathy or radiculopathy, lumbar region: Secondary | ICD-10-CM | POA: Diagnosis not present

## 2014-09-03 DIAGNOSIS — M545 Low back pain: Secondary | ICD-10-CM | POA: Diagnosis not present

## 2014-09-03 DIAGNOSIS — C3412 Malignant neoplasm of upper lobe, left bronchus or lung: Secondary | ICD-10-CM

## 2014-09-03 DIAGNOSIS — M47897 Other spondylosis, lumbosacral region: Secondary | ICD-10-CM | POA: Diagnosis not present

## 2014-09-03 DIAGNOSIS — C3410 Malignant neoplasm of upper lobe, unspecified bronchus or lung: Secondary | ICD-10-CM

## 2014-09-03 LAB — CBC WITH DIFFERENTIAL/PLATELET
BASO%: 0.9 % (ref 0.0–2.0)
BASOS ABS: 0.1 10*3/uL (ref 0.0–0.1)
EOS%: 19 % — AB (ref 0.0–7.0)
Eosinophils Absolute: 1.4 10*3/uL — ABNORMAL HIGH (ref 0.0–0.5)
HEMATOCRIT: 37.2 % (ref 34.8–46.6)
HGB: 11.4 g/dL — ABNORMAL LOW (ref 11.6–15.9)
LYMPH#: 2 10*3/uL (ref 0.9–3.3)
LYMPH%: 27.1 % (ref 14.0–49.7)
MCH: 24.5 pg — AB (ref 25.1–34.0)
MCHC: 30.6 g/dL — AB (ref 31.5–36.0)
MCV: 80 fL (ref 79.5–101.0)
MONO#: 1.1 10*3/uL — ABNORMAL HIGH (ref 0.1–0.9)
MONO%: 14.8 % — ABNORMAL HIGH (ref 0.0–14.0)
NEUT#: 2.8 10*3/uL (ref 1.5–6.5)
NEUT%: 38.2 % — AB (ref 38.4–76.8)
PLATELETS: 221 10*3/uL (ref 145–400)
RBC: 4.65 10*6/uL (ref 3.70–5.45)
RDW: 16.9 % — ABNORMAL HIGH (ref 11.2–14.5)
WBC: 7.4 10*3/uL (ref 3.9–10.3)

## 2014-09-03 LAB — COMPREHENSIVE METABOLIC PANEL (CC13)
ALBUMIN: 3.4 g/dL — AB (ref 3.5–5.0)
ALT: 14 U/L (ref 0–55)
AST: 17 U/L (ref 5–34)
Alkaline Phosphatase: 63 U/L (ref 40–150)
Anion Gap: 10 mEq/L (ref 3–11)
BILIRUBIN TOTAL: 0.22 mg/dL (ref 0.20–1.20)
BUN: 24.4 mg/dL (ref 7.0–26.0)
CO2: 26 mEq/L (ref 22–29)
Calcium: 9.7 mg/dL (ref 8.4–10.4)
Chloride: 106 mEq/L (ref 98–109)
Creatinine: 1.4 mg/dL — ABNORMAL HIGH (ref 0.6–1.1)
EGFR: 36 mL/min/{1.73_m2} — AB (ref >90)
Glucose: 95 mg/dl (ref 70–140)
Potassium: 4.4 mEq/L (ref 3.5–5.1)
Sodium: 142 mEq/L (ref 136–145)
Total Protein: 5.9 g/dL — ABNORMAL LOW (ref 6.4–8.3)

## 2014-09-03 NOTE — Telephone Encounter (Signed)
Gave avs & calendar for March. °

## 2014-09-03 NOTE — Progress Notes (Signed)
[place] [phone]   [phone]  OFFICE PROGRESS NOTE  [name], MD No address on file  PRINCIPAL DIAGNOSES:  1. 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 The patient presented at that time with a right upper lobe lesion, as well as suspicious ground-glass opacities in the left lung. The tumor was negative for EGFR mutation and negative for ALK gene translocation. Veristrat test Good. EGFR mutation studies were conflicting, initial test was negative, second test was positive for EGFR mutation in exon 18 and the the test from [facility] one was negative with positive KRAS mutation in Exon 12. 2. Stage IA malignant melanoma, status post wide excision by Dr. Allyson Sabal on Nov 12, 2008.  PRIOR THERAPY:  1) Status post right upper lobectomy with lymph node dissection under the care of Dr. Arlyce Dice on May 21, 2008.  2) Stereotactic radiotherapy to the left upper lobe lung nodule under the care of Dr. Pablo Ledger expected to be completed on 10/21/2011.  3) stereotactic radiotherapy to the left prevascular lymphadenopathy under the care of Dr. Pablo Ledger completed on 09/26/2013. 4) Tarceva 150 mg by mouth daily started 11/29/2013 discontinued today secondary to intolerance with persistent diarrhea and fatigue. 5) Tarceva 100 mg by mouth daily started on 12/27/2013. Discontinued one week after treatment because of intolerance.  CURRENT THERAPY: None  Advanced directives: The patient has advanced directive and no change is requested.  INTERVAL HISTORY: [name] 74 y.o. female returns to the clinic today for follow up visit accompanied by her husband. The patient is feeling much better today except for persistent low back pain. She was recently evaluated by Dr. Pablo Ledger for consideration of stereotactic radiotherapy to the enlarging left  upper lobe lung nodule. Dr. Pablo Ledger did not feel that the patient would be a good candidate for treatment of this area as she has previously received extensive radiation that is overlapping with that lesion. The patient is here today for evaluation and discussion of other treatment options. She denied having any significant weight loss or night sweats. The patient denied having any fever or chills. She has no nausea or vomiting. She denied having any significant chest pain or hemoptysis.   MEDICAL HISTORY: Past Medical History  Diagnosis Date  . LBBB (left bundle branch block)   . Hypertension   . Hyperthyroidism     dr Loanne Drilling- radioactive iodine treatment 2011- resolved  . Tremor     hand- intention tremor  . Complication of anesthesia     difficulty awakening, dysrhythmia post surgery  . lung ca dx'd 07/2008    lung- right non small cell, adenocarcinoma w/ broncho- alveolar features  . Melanoma     left leg  . Cervical ca dx'd 1988    surg only  . Radiation 10/11/11-10/21/11    Left upper lobe adenocarcinoma  . Hx of radiation therapy 09/16/13-09/30/13    prevascular lymph node    ALLERGIES:  is allergic to meperidine hcl; penicillins; morphine; oxycodone-acetaminophen; propoxyphene n-acetaminophen; and tramadol hcl.  MEDICATIONS:  Current Outpatient Prescriptions  Medication Sig Dispense Refill  . Aspirin-Salicylamide-Caffeine (BC HEADACHE POWDER PO) Take 1 packet by mouth 3 (three) times daily.     Marland Kitchen atenolol (TENORMIN) 25 MG tablet Take 1 tablet (25 mg total) by mouth 2 (two) times daily. 180 tablet 3  . calcium-vitamin D (OSCAL WITH D) 500-200 MG-UNIT per tablet Take 1 tablet  by mouth daily.     . celecoxib (CELEBREX) 200 MG capsule Take 200 mg by mouth daily as needed. For pain    . diltiazem (DILACOR XR) 240 MG 24 hr capsule Take 1 capsule (240 mg total) by mouth daily. 90 capsule 3  . gabapentin (NEURONTIN) 300 MG capsule Take 300 mg by mouth at bedtime. States she takes 2 hs   2  . hydrochlorothiazide (HYDRODIURIL) 25 MG tablet Take 1 tablet (25 mg total) by mouth every morning. 90 tablet 3  . Multiple Vitamin (MULITIVITAMIN WITH MINERALS) TABS Take 1 tablet by mouth daily.    . Multiple Vitamins-Minerals (PRESERVISION AREDS 2 PO) Take 2 tablets by mouth daily.    Marland Kitchen neomycin-polymyxin b-dexamethasone (MAXITROL) 3.5-10000-0.1 OINT     . prochlorperazine (COMPAZINE) 10 MG tablet Take 1 tablet (10 mg total) by mouth every 6 (six) hours as needed for nausea or vomiting. 60 tablet 0  . tiZANidine (ZANAFLEX) 4 MG tablet Take 4 mg by mouth 3 (three) times daily.  1   No current facility-administered medications for this visit.    SURGICAL HISTORY:  Past Surgical History  Procedure Laterality Date  . Tonsillectomy    . Conization for cervical dysplasia    . Rul lobectomy  2010  . Esophogus stretching      REVIEW OF SYSTEMS:  A comprehensive review of systems was negative except for: Musculoskeletal: positive for back pain   PHYSICAL EXAMINATION: General appearance: alert, cooperative and no distress Head: Normocephalic, without obvious abnormality, atraumatic Neck: no adenopathy Lymph nodes: Cervical, supraclavicular, and axillary nodes normal. Resp: clear to auscultation bilaterally Cardio: regular rate and rhythm, S1, S2 normal, no murmur, click, rub or gallop GI: soft, non-tender; bowel sounds normal; no masses,  no organomegaly Extremities: extremities normal, atraumatic, no cyanosis or edema Neurologic: Alert and oriented X 3, normal strength and tone. Normal symmetric reflexes. Normal coordination and gait  ECOG PERFORMANCE STATUS: 1 - Symptomatic but completely ambulatory  Blood pressure 157/68, pulse 66, temperature 98.1 F (36.7 C), temperature source Oral, resp. rate 18, height '5\' 5"'  (1.651 m), weight 162 lb 11.2 oz (73.8 kg), SpO2 98 %.  LABORATORY DATA: Lab Results  Component Value Date   WBC 7.4 05-May-202016   HGB 11.4* 05-May-202016   HCT 37.2  05-May-202016   MCV 80.0 05-May-202016   PLT 221 05-May-202016      Chemistry      Component Value Date/Time   NA 141 07/30/2014 1033   NA 140 08/16/2013 2138   NA 137 01/16/2012 0941   K 4.1 07/30/2014 1033   K 3.4* 08/16/2013 2138   K 4.1 01/16/2012 0941   CL 99 08/16/2013 2138   CL 105 07/17/2012 1434   CL 100 01/16/2012 0941   CO2 29 07/30/2014 1033   CO2 24 08/16/2013 2138   CO2 28 01/16/2012 0941   BUN 27.5* 07/30/2014 1033   BUN 23 08/16/2013 2138   BUN 20 01/16/2012 0941   CREATININE 1.4* 07/30/2014 1033   CREATININE 1.27* 08/16/2013 2138   CREATININE 1.2 01/16/2012 0941      Component Value Date/Time   CALCIUM 9.3 07/30/2014 1033   CALCIUM 9.4 08/16/2013 2138   CALCIUM 9.1 01/16/2012 0941   ALKPHOS 66 07/30/2014 1033   ALKPHOS 46 01/16/2012 0941   ALKPHOS 50 10/18/2011 1415   AST 16 07/30/2014 1033   AST 23 01/16/2012 0941   AST 17 10/18/2011 1415   ALT 15 07/30/2014 1033   ALT 21 01/16/2012  0941   ALT 12 10/18/2011 1415   BILITOT 0.26 07/30/2014 1033   BILITOT 0.60 01/16/2012 0941   BILITOT 0.4 10/18/2011 1415       RADIOGRAPHIC STUDIES: No results found. ASSESSMENT AND PLAN: This is a very pleasant 74 years old white female with recurrent non-small cell lung cancer, adenocarcinoma with conflicting reports regarding the EGFR mutation including the recent one from the biopsy of the left upper lobe lung nodule that showed mutation at exon 18. She recently completed a course of stereotactic radiotherapy to the left prevascular lymphadenopathy under the care of Dr. Pablo Ledger. She was recently treated with a course of Tarceva initially at 150 mg by mouth daily for one month followed by 1 week treatment at a reduced dose of 100 mg by mouth daily but unfortunately the patient was unable to tolerate her treatment and this was discontinued. The recent CT scan of the chest showed evidence for mild enlargement of the left upper lobe lung nodule with stable disease in other  areas. She is not a candidate for repeat stereotactic radiotherapy to this lesion. We discussed her case at the weekly thoracic conference and interventional radiology felt that the patient would be a good candidate for radiofrequency ablation of this lesion. I will refer the patient to interventional radiology for evaluation and discussion of this option. I will see her back for follow-up visit in one month for reevaluation. She was advised to call immediately if she has any concerning symptoms in the interval. All questions were answered. The patient knows to call the clinic with any problems, questions or concerns. We can certainly see the patient much sooner if necessary.  Disclaimer: This note was dictated with voice recognition software. Similar sounding words can inadvertently be transcribed and may not be corrected upon review.

## 2014-09-04 DIAGNOSIS — M545 Low back pain: Secondary | ICD-10-CM | POA: Diagnosis not present

## 2014-09-04 DIAGNOSIS — M9903 Segmental and somatic dysfunction of lumbar region: Secondary | ICD-10-CM | POA: Diagnosis not present

## 2014-09-04 DIAGNOSIS — M5386 Other specified dorsopathies, lumbar region: Secondary | ICD-10-CM | POA: Diagnosis not present

## 2014-09-08 DIAGNOSIS — M5386 Other specified dorsopathies, lumbar region: Secondary | ICD-10-CM | POA: Diagnosis not present

## 2014-09-08 DIAGNOSIS — M9903 Segmental and somatic dysfunction of lumbar region: Secondary | ICD-10-CM | POA: Diagnosis not present

## 2014-09-08 DIAGNOSIS — M545 Low back pain: Secondary | ICD-10-CM | POA: Diagnosis not present

## 2014-09-08 DIAGNOSIS — M47897 Other spondylosis, lumbosacral region: Secondary | ICD-10-CM | POA: Diagnosis not present

## 2014-09-09 DIAGNOSIS — M545 Low back pain: Secondary | ICD-10-CM | POA: Diagnosis not present

## 2014-09-09 DIAGNOSIS — M5386 Other specified dorsopathies, lumbar region: Secondary | ICD-10-CM | POA: Diagnosis not present

## 2014-09-09 DIAGNOSIS — M9903 Segmental and somatic dysfunction of lumbar region: Secondary | ICD-10-CM | POA: Diagnosis not present

## 2014-09-10 DIAGNOSIS — M47897 Other spondylosis, lumbosacral region: Secondary | ICD-10-CM | POA: Diagnosis not present

## 2014-09-11 ENCOUNTER — Ambulatory Visit
Admission: RE | Admit: 2014-09-11 | Discharge: 2014-09-11 | Disposition: A | Discharge status: Home / Self Care | Payer: Medicare Other | Source: Ambulatory Visit | Attending: Internal Medicine | Admitting: Internal Medicine

## 2014-09-11 ENCOUNTER — Other Ambulatory Visit: Payer: Self-pay | Admitting: Physician Assistant

## 2014-09-11 ENCOUNTER — Other Ambulatory Visit: Payer: Self-pay | Admitting: Interventional Radiology

## 2014-09-11 DIAGNOSIS — L814 Other melanin hyperpigmentation: Secondary | ICD-10-CM | POA: Diagnosis not present

## 2014-09-11 DIAGNOSIS — C3412 Malignant neoplasm of upper lobe, left bronchus or lung: Secondary | ICD-10-CM

## 2014-09-11 DIAGNOSIS — Z85820 Personal history of malignant melanoma of skin: Secondary | ICD-10-CM | POA: Diagnosis not present

## 2014-09-11 DIAGNOSIS — Z08 Encounter for follow-up examination after completed treatment for malignant neoplasm: Secondary | ICD-10-CM | POA: Diagnosis not present

## 2014-09-11 DIAGNOSIS — Z85828 Personal history of other malignant neoplasm of skin: Secondary | ICD-10-CM | POA: Diagnosis not present

## 2014-09-11 DIAGNOSIS — C44619 Basal cell carcinoma of skin of left upper limb, including shoulder: Secondary | ICD-10-CM | POA: Diagnosis not present

## 2014-09-11 DIAGNOSIS — R911 Solitary pulmonary nodule: Secondary | ICD-10-CM | POA: Diagnosis not present

## 2014-09-11 DIAGNOSIS — L570 Actinic keratosis: Secondary | ICD-10-CM | POA: Diagnosis not present

## 2014-09-11 DIAGNOSIS — Z85118 Personal history of other malignant neoplasm of bronchus and lung: Secondary | ICD-10-CM | POA: Diagnosis not present

## 2014-09-11 HISTORY — PX: IR GENERIC HISTORICAL: IMG1180011

## 2014-09-11 NOTE — Consult Note (Signed)
Chief Complaint: Chief Complaint  Patient presents with  . Advice Only    Consult for Left Lung Nodule    Referring Physician(s): Mohamed,Mohamed  History of Present Illness: [name] is a 74 y.o. female with a history of recurrent non-small cell lung cancer initially diagnosed as stage I A (T1aN0MX) adenocarcinoma with broncho-alveolar features in the right upper lobe. At that same time, she had suspicious but nondiagnostic ground glass attenuation opacities in the left lung.  She underwent a right upper lobectomy with lymph node dissection. Subsequently, she was diagnosed with metachronous adenocarcinoma in the left upper lobe in early 2013. She underwent stereotactic radiotherapy performed by Dr. Pablo Ledger of radiation oncology (60 Gy completed April 2013).   A metastatic mediastinal node was diagnosed and she was treated with an additional 50 Gy completed in March 2015.  Adjuvant chemotherapy (Tarceva) was initiated on 11/29/2013 but discontinued shortly thereafter due to intolerance despite dose reduction.  Subsequent follow-up imaging shows excellent response of her mediastinal node but an enlarging spiculated nodule in the left upper lobe at the margin of her prior radiation zone.  She was evaluated by Dr. Pablo Ledger and any further SBRT would result in overlap of the treatment zones and potential complications including bronchopleural fistula or aortitis.  Her case was then discussed at the multidisciplinary tumor board and she was felt to be a candidate for percutaneous ablation.  She presents today to discuss this further.  [name] is an extremely pleasant woman who is very knowledgeable about her current condition and extensive treatment history.  She is currently asymptomatic and denies shortness of breath, chest pain, pressure or tightness, fever, chills and unintended weight loss. On review of symptoms she notes that she does have issues with chronic lower back pain  secondary to degenerative disc disease.  She is actively pursuing further evaluation and treatment for this condition.  Past Medical History  Diagnosis Date  . LBBB (left bundle branch block)   . Hypertension   . Hyperthyroidism     dr Loanne Drilling- radioactive iodine treatment 2011- resolved  . Tremor     hand- intention tremor  . Complication of anesthesia     difficulty awakening, dysrhythmia post surgery  . lung ca dx'd 07/2008    lung- right non small cell, adenocarcinoma w/ broncho- alveolar features  . Melanoma     left leg  . Cervical ca dx'd 1988    surg only  . Radiation 10/11/11-10/21/11    Left upper lobe adenocarcinoma  . Hx of radiation therapy 09/16/13-09/30/13    prevascular lymph node    Past Surgical History  Procedure Laterality Date  . Tonsillectomy    . Conization for cervical dysplasia    . Rul lobectomy  2010  . Esophogus stretching      Allergies: Meperidine hcl; Penicillins; Morphine; Oxycodone-acetaminophen; Propoxyphene n-acetaminophen; and Tramadol hcl  Medications: Prior to Admission medications   Medication Sig Start Date End Date Taking? Authorizing Provider  Aspirin-Salicylamide-Caffeine (BC HEADACHE POWDER PO) Take 1 packet by mouth 3 (three) times daily.     Historical Provider, MD  atenolol (TENORMIN) 25 MG tablet Take 1 tablet (25 mg total) by mouth 2 (two) times daily. 05/07/14   Carlena Bjornstad, MD  calcium-vitamin D (OSCAL WITH D) 500-200 MG-UNIT per tablet Take 1 tablet by mouth daily.     Historical Provider, MD  celecoxib (CELEBREX) 200 MG capsule Take 200 mg by mouth daily as needed. For pain  Historical Provider, MD  diltiazem (DILACOR XR) 240 MG 24 hr capsule Take 1 capsule (240 mg total) by mouth daily. 05/07/14   Carlena Bjornstad, MD  gabapentin (NEURONTIN) 300 MG capsule Take 300 mg by mouth at bedtime. States she takes 2 hs 07/31/14   Historical Provider, MD  hydrochlorothiazide (HYDRODIURIL) 25 MG tablet Take 1 tablet (25 mg total) by  mouth every morning. 05/07/14   Carlena Bjornstad, MD  Multiple Vitamin (MULITIVITAMIN WITH MINERALS) TABS Take 1 tablet by mouth daily.    Historical Provider, MD  Multiple Vitamins-Minerals (PRESERVISION AREDS 2 PO) Take 2 tablets by mouth daily.    Historical Provider, MD  neomycin-polymyxin b-dexamethasone (MAXITROL) 3.5-10000-0.1 OINT  08/28/14   Historical Provider, MD  prochlorperazine (COMPAZINE) 10 MG tablet Take 1 tablet (10 mg total) by mouth every 6 (six) hours as needed for nausea or vomiting. 08/29/13   Curt Bears, MD  tiZANidine (ZANAFLEX) 4 MG tablet Take 4 mg by mouth 3 (three) times daily. 07/29/14   Historical Provider, MD     Family History  Problem Relation Age of Onset  . Asthma Mother   . Heart disease Mother   . Thyroid disease Daughter     hypothyroidism  . Cancer Maternal Uncle     lung  . Cancer Maternal Grandfather     lung  . Diabetes Neg Hx   . Coronary artery disease Neg Hx     History   Social History  . Marital Status: Married    Spouse Name: N/A  . Number of Children: 2  . Years of Education: N/A   Occupational History  . homemaker    Social History Main Topics  . Smoking status: Former Smoker -- 1.00 packs/day for 17 years    Types: Cigarettes    Quit date: 09/28/1975  . Smokeless tobacco: Never Used     Comment: 33 yrs ago  . Alcohol Use: Yes     Comment: glass wine daily  . Drug Use: No  . Sexual Activity: Not on file   Other Topics Concern  . Not on file   Social History Narrative   [employer] graduate   Married 1963   4 grandchildren and 2 step-grandchildren   Marriage in good health            Physician Roster:   Oncologist- Dr Julien Nordmann   Surgeon- Dr Arlyce Dice   Gyn- Dr Mable Fill- Dr Marlowe Shores Status: 0 - Asymptomatic  Review of Systems: A 12 point ROS discussed and pertinent positives are indicated in the HPI above.  All other systems are negative.  Review of Systems  Vital Signs: BP 177/77 mmHg  Pulse 72   Temp(Src) 98 F (36.7 C) (Oral)  Resp 14  Ht 5\' 5"  (1.651 m)  Wt 160 lb (72.576 kg)  BMI 26.63 kg/m2  SpO2 98%  Physical Exam  Constitutional: She is oriented to person, place, and time. She appears well-developed and well-nourished. No distress.  HENT:  Head: Normocephalic and atraumatic.  Eyes: No scleral icterus.  Cardiovascular: Normal rate, regular rhythm and normal heart sounds.  Exam reveals no gallop and no friction rub.   No murmur heard. Pulmonary/Chest: Effort normal and breath sounds normal.  Neurological: She is alert and oriented to person, place, and time.  Skin: Skin is warm and dry.  Psychiatric: She has a normal mood and affect. Her behavior is normal.  Nursing note and vitals reviewed.   Mallampati  Score: 1  Imaging: No results found.  Labs:  CBC:  Recent Labs  01/24/14 0900 04/29/14 1010 07/30/14 1031 09/03/14 0848  WBC 7.8 9.1 7.4 7.4  HGB 12.3 11.5* 11.0* 11.4*  HCT 37.1 37.0 36.6 37.2  PLT 253 240 215 221    COAGS: No results for input(s): INR, APTT in the last 8760 hours.  BMP:  Recent Labs  01/24/14 0900 04/29/14 1010 07/30/14 1033 09/03/14 0848  NA 141 143 141 142  K 3.8 3.9 4.1 4.4  CO2 24 26 29 26   GLUCOSE 100 101 95 95  BUN 20.9 24.0 27.5* 24.4  CALCIUM 9.2 9.8 9.3 9.7  CREATININE 1.5* 1.7* 1.4* 1.4*    LIVER FUNCTION TESTS:  Recent Labs  01/24/14 0900 04/29/14 1010 07/30/14 1033 09/03/14 0848  BILITOT 0.31 0.28 0.26 0.22  AST 16 22 16 17   ALT 18 23 15 14   ALKPHOS 57 58 66 63  PROT 6.0* 6.3* 5.9* 5.9*  ALBUMIN 3.1* 3.5 3.5 3.4*    TUMOR MARKERS: No results for input(s): AFPTM, CEA, CA199, CHROMGRNA in the last 8760 hours.  Assessment and Plan:  [name] is an extremely pleasant 74 year old woman with a history of multifocal bronchoalveolar (adenocarcinoma) carcinoma. She has had tissue diagnosis of several of her lesions since her initial diagnosis in 2010. Up to this point she has undergone right  upper lobectomy, stereotactic radiation therapy of a left upper lobe pulmonary nodule and a left prevascular mediastinal lymph node.  She now has an enlarging spiculated nodule in the left upper lobe adjacent to her prior radiation therapy consistent with new recurrent adenocarcinoma. She has maximized dosing for radiation therapy in this region and has failed to tolerate systemic chemotherapy with Tarceva despite reduced dosing.  Her nodule at 1.4 cm in the central aspect of the left upper lung represents an excellent size and anatomic location for percutaneous ablation. I feel very confident that we can achieve local control of this small but enlarging nodule.  I do not believe we need a repeat biopsy given her history and multiple prior tissue diagnoses.  I discussed the risks (pneumothorax, bleeding, hemoptysis, infection, bronchopleural fistula, cavitation with subsequent chronic infection, and local recurrence), benefits and alternatives with both Mrs. Allmon and her husband.  They understand and wish to proceed with percutaneous thermal ablation.  1.) I will schedule Mrs. Brennan for a percutaneous thermal ablation of her enlarging left upper lobe putative adenocarcinoma as soon as possible. Her last imaging was on 07/30/2014 which is already 5 weeks ago. Although this nodule has proved slow growing, our chance at local control is enhanced the sooner we can provide treatment.   2.) Following percutaneous thermal ablation I will recommend a follow-up PET/CT at 3 months post procedure to evaluate for any residual hypermetabolic activity.  Thank you for this interesting consult.  I greatly enjoyed meeting [name] and look forward to participating in their care.  SignedJacqulynn Cadet 09/11/2014, 12:07 PM   I spent a total of  45 Minutes in face to face in clinical consultation, greater than 50% of which was counseling/coordinating care for recurrent multifocal primary lung adenocarcinoma  with bronchoalveolar features.

## 2014-09-12 ENCOUNTER — Encounter: Payer: Self-pay | Admitting: *Deleted

## 2014-09-12 ENCOUNTER — Telehealth: Payer: Self-pay | Admitting: Internal Medicine

## 2014-09-12 ENCOUNTER — Other Ambulatory Visit: Payer: Self-pay | Admitting: Interventional Radiology

## 2014-09-12 DIAGNOSIS — M545 Low back pain: Secondary | ICD-10-CM | POA: Diagnosis not present

## 2014-09-12 DIAGNOSIS — M5386 Other specified dorsopathies, lumbar region: Secondary | ICD-10-CM | POA: Diagnosis not present

## 2014-09-12 DIAGNOSIS — C3412 Malignant neoplasm of upper lobe, left bronchus or lung: Secondary | ICD-10-CM

## 2014-09-12 DIAGNOSIS — M9903 Segmental and somatic dysfunction of lumbar region: Secondary | ICD-10-CM | POA: Diagnosis not present

## 2014-09-12 NOTE — Telephone Encounter (Signed)
S/w pt confirming labs/ov per 03/04 moved from 03/23 to 03/30 due to CT testing.... KJ

## 2014-09-15 DIAGNOSIS — M47897 Other spondylosis, lumbosacral region: Secondary | ICD-10-CM | POA: Diagnosis not present

## 2014-09-16 DIAGNOSIS — M9903 Segmental and somatic dysfunction of lumbar region: Secondary | ICD-10-CM | POA: Diagnosis not present

## 2014-09-16 DIAGNOSIS — M545 Low back pain: Secondary | ICD-10-CM | POA: Diagnosis not present

## 2014-09-16 DIAGNOSIS — M5386 Other specified dorsopathies, lumbar region: Secondary | ICD-10-CM | POA: Diagnosis not present

## 2014-09-17 DIAGNOSIS — M47897 Other spondylosis, lumbosacral region: Secondary | ICD-10-CM | POA: Diagnosis not present

## 2014-09-17 DIAGNOSIS — M76829 Posterior tibial tendinitis, unspecified leg: Secondary | ICD-10-CM | POA: Diagnosis not present

## 2014-09-17 DIAGNOSIS — M722 Plantar fascial fibromatosis: Secondary | ICD-10-CM | POA: Diagnosis not present

## 2014-09-24 ENCOUNTER — Encounter (HOSPITAL_COMMUNITY): Payer: Self-pay

## 2014-09-24 ENCOUNTER — Other Ambulatory Visit: Payer: Self-pay | Admitting: Radiology

## 2014-09-24 NOTE — Progress Notes (Signed)
Called [facility] for orders surgery 10-01-14 pre op 09-26-14 Thanks

## 2014-09-25 NOTE — Patient Instructions (Addendum)
[place]  09/25/2014   Your procedure is scheduled on:   10-01-2014 Wednesday  Enter through [facility]  Entrance and follow signs to [facility]. Arrive at       0630 AM .  Call this number if you have problems the morning of surgery: [phone]  Or Presurgical Testing [phone].   For Living Will and/or Health Care Power Attorney Forms: please provide copy for your medical record,may bring AM of surgery(Forms should be already notarized -we do not provide this service).(09-26-14 Yes/ HCPOA form in [place] ).    Do not eat food/ or drink: After Midnight.      Take these medicines the morning of surgery with A SIP OF WATER: Atenolol, Diltiazem.   Do not wear jewelry, make-up or nail polish.  Do not wear deodorant, lotions, powders, or perfumes.   Do not shave legs and under arms- 48 hours(2 days) prior to first CHG shower.(Shaving face and neck okay.)  Do not bring valuables to the hospital.(Hospital is not responsible for lost valuables).  Contacts, dentures or removable bridgework, body piercing, hair pins may not be worn into surgery.  Leave suitcase in the car. After surgery it may be brought to your room.  For patients admitted to the hospital, checkout time is 11:00 AM the day of discharge.(Restricted visitors-Any Persons displaying flu-like symptoms or illness).    Patients discharged the day of surgery will not be allowed to drive home. Must have responsible person with you x 24 hours once discharged.  Name and phone number of your driver: Jenny Reichmann -spouse [phone] cell     Please read over the following fact sheets that you were given:  CHG(Chlorhexidine Gluconate 4% Surgical Soap) use.  Remember : Type/Screen "Blue armbands" - may not be removed once applied(would result in being retested AM of surgery, if removed).         [facility] - Preparing for Surgery Before surgery, you can play an important role.  Because skin is not sterile, your skin  needs to be as free of germs as possible.  You can reduce the number of germs on your skin by washing with CHG (chlorahexidine gluconate) soap before surgery.  CHG is an antiseptic cleaner which kills germs and bonds with the skin to continue killing germs even after washing. Please DO NOT use if you have an allergy to CHG or antibacterial soaps.  If your skin becomes reddened/irritated stop using the CHG and inform your nurse when you arrive at Short Stay. Do not shave (including legs and underarms) for at least 48 hours prior to the first CHG shower.  You may shave your face/neck. Please follow these instructions carefully:  1.  Shower with CHG Soap the night before surgery and the  morning of Surgery.  2.  If you choose to wash your hair, wash your hair first as usual with your  normal  shampoo.  3.  After you shampoo, rinse your hair and body thoroughly to remove the  shampoo.                           4.  Use CHG as you would any other liquid soap.  You can apply chg directly  to the skin and wash                       Gently with a scrungie or clean washcloth.  5.  Apply the CHG Soap to your body ONLY FROM THE NECK DOWN.   Do not use on face/ open                           Wound or open sores. Avoid contact with eyes, ears mouth and genitals (private parts).                       Wash face,  Genitals (private parts) with your normal soap.             6.  Wash thoroughly, paying special attention to the area where your surgery  will be performed.  7.  Thoroughly rinse your body with warm water from the neck down.  8.  DO NOT shower/wash with your normal soap after using and rinsing off  the CHG Soap.                9.  Pat yourself dry with a clean towel.            10.  Wear clean pajamas.            11.  Place clean sheets on your bed the night of your first shower and do not  sleep with pets. Day of Surgery : Do not apply any lotions/deodorants the morning of surgery.  Please wear clean  clothes to the hospital/surgery center.  FAILURE TO FOLLOW THESE INSTRUCTIONS MAY RESULT IN THE CANCELLATION OF YOUR SURGERY PATIENT SIGNATURE_________________________________  NURSE SIGNATURE__________________________________  ________________________________________________________________________

## 2014-09-26 ENCOUNTER — Encounter (HOSPITAL_COMMUNITY)
Admission: RE | Admit: 2014-09-26 | Discharge: 2014-09-26 | Disposition: A | Discharge status: Home / Self Care | Payer: Medicare Other | Source: Ambulatory Visit | Attending: Interventional Radiology | Admitting: Interventional Radiology

## 2014-09-26 ENCOUNTER — Encounter (HOSPITAL_COMMUNITY): Payer: Self-pay

## 2014-09-26 DIAGNOSIS — Z01812 Encounter for preprocedural laboratory examination: Secondary | ICD-10-CM | POA: Diagnosis not present

## 2014-09-26 DIAGNOSIS — Z01810 Encounter for preprocedural cardiovascular examination: Secondary | ICD-10-CM | POA: Insufficient documentation

## 2014-09-26 DIAGNOSIS — C3412 Malignant neoplasm of upper lobe, left bronchus or lung: Secondary | ICD-10-CM | POA: Diagnosis not present

## 2014-09-26 HISTORY — DX: Headache: R51

## 2014-09-26 HISTORY — DX: Unspecified osteoarthritis, unspecified site: M19.90

## 2014-09-26 HISTORY — DX: Unspecified macular degeneration: H35.30

## 2014-09-26 HISTORY — DX: Malignant neoplasm of unspecified part of unspecified bronchus or lung: C34.90

## 2014-09-26 HISTORY — DX: Headache, unspecified: R51.9

## 2014-09-26 LAB — BASIC METABOLIC PANEL
Anion gap: 8 (ref 5–15)
BUN: 22 mg/dL (ref 6–23)
CO2: 31 mmol/L (ref 19–32)
Calcium: 9.5 mg/dL (ref 8.4–10.5)
Chloride: 102 mmol/L (ref 96–112)
Creatinine, Ser: 1.41 mg/dL — ABNORMAL HIGH (ref 0.50–1.10)
GFR calc Af Amer: 42 mL/min — ABNORMAL LOW (ref >90)
GFR, EST NON AFRICAN AMERICAN: 36 mL/min — AB (ref >90)
GLUCOSE: 100 mg/dL — AB (ref 70–99)
Potassium: 4.7 mmol/L (ref 3.5–5.1)
SODIUM: 141 mmol/L (ref 135–145)

## 2014-09-26 LAB — CBC WITH DIFFERENTIAL/PLATELET
BASOS ABS: 0 10*3/uL (ref 0.0–0.1)
BASOS PCT: 0 % (ref 0–1)
EOS ABS: 1.8 10*3/uL — AB (ref 0.0–0.7)
Eosinophils Relative: 16 % — ABNORMAL HIGH (ref 0–5)
HEMATOCRIT: 37.5 % (ref 36.0–46.0)
HEMOGLOBIN: 11.4 g/dL — AB (ref 12.0–15.0)
LYMPHS PCT: 21 % (ref 12–46)
Lymphs Abs: 2.2 10*3/uL (ref 0.7–4.0)
MCH: 24.5 pg — ABNORMAL LOW (ref 26.0–34.0)
MCHC: 30.4 g/dL (ref 30.0–36.0)
MCV: 80.6 fL (ref 78.0–100.0)
Monocytes Absolute: 1.7 10*3/uL — ABNORMAL HIGH (ref 0.1–1.0)
Monocytes Relative: 15 % — ABNORMAL HIGH (ref 3–12)
NEUTROS ABS: 5.2 10*3/uL (ref 1.7–7.7)
NEUTROS PCT: 48 % (ref 43–77)
Platelets: 279 10*3/uL (ref 150–400)
RBC: 4.65 MIL/uL (ref 3.87–5.11)
RDW: 18 % — ABNORMAL HIGH (ref 11.5–15.5)
WBC: 10.9 10*3/uL — AB (ref 4.0–10.5)

## 2014-09-26 LAB — PROTIME-INR
INR: 0.93 (ref 0.00–1.49)
Prothrombin Time: 12.6 seconds (ref 11.6–15.2)

## 2014-09-26 LAB — ABO/RH: ABO/RH(D): B NEG

## 2014-09-26 NOTE — Pre-Procedure Instructions (Signed)
09-26-14 EKG done today. CT Chest 07-30-14 Epic.

## 2014-09-29 ENCOUNTER — Other Ambulatory Visit: Payer: Self-pay | Admitting: Radiology

## 2014-09-30 ENCOUNTER — Other Ambulatory Visit: Payer: Self-pay | Admitting: Radiology

## 2014-09-30 DIAGNOSIS — M9903 Segmental and somatic dysfunction of lumbar region: Secondary | ICD-10-CM | POA: Diagnosis not present

## 2014-09-30 DIAGNOSIS — M545 Low back pain: Secondary | ICD-10-CM | POA: Diagnosis not present

## 2014-09-30 DIAGNOSIS — M5386 Other specified dorsopathies, lumbar region: Secondary | ICD-10-CM | POA: Diagnosis not present

## 2014-10-01 ENCOUNTER — Encounter (HOSPITAL_COMMUNITY): Payer: Self-pay

## 2014-10-01 ENCOUNTER — Ambulatory Visit: Payer: Medicare Other | Admitting: Internal Medicine

## 2014-10-01 ENCOUNTER — Ambulatory Visit (HOSPITAL_COMMUNITY)
Admission: RE | Admit: 2014-10-01 | Discharge: 2014-10-01 | Disposition: A | Discharge status: Home / Self Care | Payer: Medicare Other | Source: Ambulatory Visit | Attending: Interventional Radiology | Admitting: Interventional Radiology

## 2014-10-01 ENCOUNTER — Other Ambulatory Visit: Payer: Medicare Other

## 2014-10-01 ENCOUNTER — Encounter (HOSPITAL_COMMUNITY): Payer: Self-pay | Admitting: General Practice

## 2014-10-01 ENCOUNTER — Observation Stay (HOSPITAL_COMMUNITY): Payer: Medicare Other

## 2014-10-01 ENCOUNTER — Observation Stay (HOSPITAL_COMMUNITY)
Admission: RE | Admit: 2014-10-01 | Discharge: 2014-10-02 | Disposition: A | Discharge status: Home / Self Care | Payer: Medicare Other | Source: Ambulatory Visit | Attending: Interventional Radiology | Admitting: Interventional Radiology

## 2014-10-01 ENCOUNTER — Encounter (HOSPITAL_COMMUNITY)
Admission: RE | Disposition: A | Discharge status: Home / Self Care | Payer: Self-pay | Source: Ambulatory Visit | Attending: Interventional Radiology

## 2014-10-01 ENCOUNTER — Ambulatory Visit (HOSPITAL_COMMUNITY): Payer: Medicare Other | Admitting: Anesthesiology

## 2014-10-01 ENCOUNTER — Ambulatory Visit (HOSPITAL_COMMUNITY): Payer: Medicare Other

## 2014-10-01 DIAGNOSIS — C3412 Malignant neoplasm of upper lobe, left bronchus or lung: Secondary | ICD-10-CM | POA: Diagnosis not present

## 2014-10-01 DIAGNOSIS — E0590 Thyrotoxicosis, unspecified without thyrotoxic crisis or storm: Secondary | ICD-10-CM | POA: Insufficient documentation

## 2014-10-01 DIAGNOSIS — Z85820 Personal history of malignant melanoma of skin: Secondary | ICD-10-CM | POA: Insufficient documentation

## 2014-10-01 DIAGNOSIS — C3492 Malignant neoplasm of unspecified part of left bronchus or lung: Secondary | ICD-10-CM | POA: Insufficient documentation

## 2014-10-01 DIAGNOSIS — Z8541 Personal history of malignant neoplasm of cervix uteri: Secondary | ICD-10-CM | POA: Diagnosis not present

## 2014-10-01 DIAGNOSIS — Z7982 Long term (current) use of aspirin: Secondary | ICD-10-CM | POA: Diagnosis not present

## 2014-10-01 DIAGNOSIS — Z87891 Personal history of nicotine dependence: Secondary | ICD-10-CM | POA: Diagnosis not present

## 2014-10-01 DIAGNOSIS — R911 Solitary pulmonary nodule: Secondary | ICD-10-CM | POA: Diagnosis not present

## 2014-10-01 DIAGNOSIS — T819XXA Unspecified complication of procedure, initial encounter: Secondary | ICD-10-CM

## 2014-10-01 DIAGNOSIS — Z9889 Other specified postprocedural states: Secondary | ICD-10-CM

## 2014-10-01 DIAGNOSIS — C3490 Malignant neoplasm of unspecified part of unspecified bronchus or lung: Secondary | ICD-10-CM | POA: Diagnosis present

## 2014-10-01 DIAGNOSIS — I10 Essential (primary) hypertension: Secondary | ICD-10-CM | POA: Diagnosis not present

## 2014-10-01 DIAGNOSIS — Z01818 Encounter for other preprocedural examination: Secondary | ICD-10-CM

## 2014-10-01 DIAGNOSIS — Z09 Encounter for follow-up examination after completed treatment for conditions other than malignant neoplasm: Secondary | ICD-10-CM | POA: Diagnosis not present

## 2014-10-01 LAB — CBC WITH DIFFERENTIAL/PLATELET
BASOS ABS: 0 10*3/uL (ref 0.0–0.1)
Basophils Relative: 0 % (ref 0–1)
EOS ABS: 0.9 10*3/uL — AB (ref 0.0–0.7)
Eosinophils Relative: 10 % — ABNORMAL HIGH (ref 0–5)
HCT: 36.5 % (ref 36.0–46.0)
Hemoglobin: 11.6 g/dL — ABNORMAL LOW (ref 12.0–15.0)
LYMPHS ABS: 2 10*3/uL (ref 0.7–4.0)
LYMPHS PCT: 22 % (ref 12–46)
MCH: 24.9 pg — ABNORMAL LOW (ref 26.0–34.0)
MCHC: 31.8 g/dL (ref 30.0–36.0)
MCV: 78.5 fL (ref 78.0–100.0)
Monocytes Absolute: 1.2 10*3/uL — ABNORMAL HIGH (ref 0.1–1.0)
Monocytes Relative: 13 % — ABNORMAL HIGH (ref 3–12)
NEUTROS ABS: 4.9 10*3/uL (ref 1.7–7.7)
Neutrophils Relative %: 55 % (ref 43–77)
PLATELETS: 246 10*3/uL (ref 150–400)
RBC: 4.65 MIL/uL (ref 3.87–5.11)
RDW: 17.6 % — ABNORMAL HIGH (ref 11.5–15.5)
WBC: 9.1 10*3/uL (ref 4.0–10.5)

## 2014-10-01 LAB — COMPREHENSIVE METABOLIC PANEL
ALBUMIN: 3.9 g/dL (ref 3.5–5.2)
ALT: 42 U/L — ABNORMAL HIGH (ref 0–35)
AST: 39 U/L — ABNORMAL HIGH (ref 0–37)
Alkaline Phosphatase: 59 U/L (ref 39–117)
Anion gap: 12 (ref 5–15)
BILIRUBIN TOTAL: 0.6 mg/dL (ref 0.3–1.2)
BUN: 30 mg/dL — ABNORMAL HIGH (ref 6–23)
CHLORIDE: 104 mmol/L (ref 96–112)
CO2: 24 mmol/L (ref 19–32)
Calcium: 9.6 mg/dL (ref 8.4–10.5)
Creatinine, Ser: 1.36 mg/dL — ABNORMAL HIGH (ref 0.50–1.10)
GFR calc Af Amer: 44 mL/min — ABNORMAL LOW (ref >90)
GFR, EST NON AFRICAN AMERICAN: 38 mL/min — AB (ref >90)
Glucose, Bld: 105 mg/dL — ABNORMAL HIGH (ref 70–99)
Potassium: 4.1 mmol/L (ref 3.5–5.1)
Sodium: 140 mmol/L (ref 135–145)
Total Protein: 6.7 g/dL (ref 6.0–8.3)

## 2014-10-01 LAB — TYPE AND SCREEN
ABO/RH(D): B NEG
Antibody Screen: NEGATIVE

## 2014-10-01 SURGERY — RADIO FREQUENCY ABLATION
Anesthesia: General

## 2014-10-01 MED ORDER — DEXAMETHASONE SODIUM PHOSPHATE 10 MG/ML IJ SOLN
INTRAMUSCULAR | Status: DC | PRN
  Administered 2014-10-01: 10 mg via INTRAVENOUS

## 2014-10-01 MED ORDER — ATENOLOL 25 MG PO TABS
25.0000 mg | ORAL_TABLET | Freq: Two times a day (BID) | ORAL | Status: DC
  Administered 2014-10-01 – 2014-10-02 (×2): 25 mg via ORAL
  Filled 2014-10-01 (×2): qty 1

## 2014-10-01 MED ORDER — FENTANYL CITRATE 0.05 MG/ML IJ SOLN
INTRAMUSCULAR | Status: DC | PRN
  Administered 2014-10-01: 50 ug via INTRAVENOUS

## 2014-10-01 MED ORDER — HYDROCHLOROTHIAZIDE 25 MG PO TABS
25.0000 mg | ORAL_TABLET | Freq: Every day | ORAL | Status: DC
  Administered 2014-10-01 – 2014-10-02 (×2): 25 mg via ORAL
  Filled 2014-10-01 (×2): qty 1

## 2014-10-01 MED ORDER — LACTATED RINGERS IV SOLN
INTRAVENOUS | Status: DC
  Administered 2014-10-01: 07:00:00 via INTRAVENOUS

## 2014-10-01 MED ORDER — CISATRACURIUM BESYLATE 20 MG/10ML IV SOLN
INTRAVENOUS | Status: AC
  Filled 2014-10-01: qty 10

## 2014-10-01 MED ORDER — NEOSTIGMINE METHYLSULFATE 10 MG/10ML IV SOLN
INTRAVENOUS | Status: AC
  Filled 2014-10-01: qty 1

## 2014-10-01 MED ORDER — HYDROMORPHONE HCL 1 MG/ML IJ SOLN: 0.2500 mg | INTRAMUSCULAR | Status: DC | PRN

## 2014-10-01 MED ORDER — FENTANYL CITRATE 0.05 MG/ML IJ SOLN
INTRAMUSCULAR | Status: AC
  Filled 2014-10-01: qty 2

## 2014-10-01 MED ORDER — SODIUM CHLORIDE 0.9 % IV SOLN
INTRAVENOUS | Status: DC
  Administered 2014-10-01 – 2014-10-02 (×2): via INTRAVENOUS

## 2014-10-01 MED ORDER — ONDANSETRON HCL 4 MG/2ML IJ SOLN: 4.0000 mg | Freq: Once | INTRAMUSCULAR | Status: DC | PRN

## 2014-10-01 MED ORDER — DEXAMETHASONE SODIUM PHOSPHATE 10 MG/ML IJ SOLN
INTRAMUSCULAR | Status: AC
  Filled 2014-10-01: qty 1

## 2014-10-01 MED ORDER — PROCHLORPERAZINE MALEATE 10 MG PO TABS: 10.0000 mg | ORAL_TABLET | Freq: Four times a day (QID) | ORAL | Status: DC | PRN

## 2014-10-01 MED ORDER — VANCOMYCIN HCL IN DEXTROSE 1-5 GM/200ML-% IV SOLN
1000.0000 mg | Freq: Once | INTRAVENOUS | Status: AC
  Administered 2014-10-01: 1000 mg via INTRAVENOUS
  Filled 2014-10-01: qty 200

## 2014-10-01 MED ORDER — DILTIAZEM HCL ER 240 MG PO CP24
240.0000 mg | ORAL_CAPSULE | Freq: Every day | ORAL | Status: DC
  Administered 2014-10-02: 240 mg via ORAL
  Filled 2014-10-01 (×2): qty 1

## 2014-10-01 MED ORDER — GABAPENTIN 300 MG PO CAPS
300.0000 mg | ORAL_CAPSULE | Freq: Every day | ORAL | Status: DC
  Administered 2014-10-01: 300 mg via ORAL
  Filled 2014-10-01: qty 1

## 2014-10-01 MED ORDER — PROPOFOL 10 MG/ML IV BOLUS
INTRAVENOUS | Status: DC | PRN
  Administered 2014-10-01 (×3): 25 mg via INTRAVENOUS
  Administered 2014-10-01: 50 mg via INTRAVENOUS
  Administered 2014-10-01: 25 mg via INTRAVENOUS
  Administered 2014-10-01 (×2): 50 mg via INTRAVENOUS
  Administered 2014-10-01 (×2): 25 mg via INTRAVENOUS

## 2014-10-01 MED ORDER — FLUTICASONE PROPIONATE 50 MCG/ACT NA SUSP
1.0000 | Freq: Every day | NASAL | Status: DC | PRN
  Filled 2014-10-01: qty 16

## 2014-10-01 MED ORDER — DOCUSATE SODIUM 100 MG PO CAPS
100.0000 mg | ORAL_CAPSULE | Freq: Two times a day (BID) | ORAL | Status: DC
  Administered 2014-10-01 – 2014-10-02 (×2): 100 mg via ORAL
  Filled 2014-10-01 (×5): qty 1

## 2014-10-01 MED ORDER — METOCLOPRAMIDE HCL 5 MG/ML IJ SOLN
INTRAMUSCULAR | Status: AC
Start: 2014-10-01 — End: 2014-10-01
  Filled 2014-10-01: qty 2

## 2014-10-01 MED ORDER — ACETAMINOPHEN 500 MG PO TABS
1000.0000 mg | ORAL_TABLET | Freq: Four times a day (QID) | ORAL | Status: DC | PRN
  Administered 2014-10-01 – 2014-10-02 (×2): 1000 mg via ORAL
  Filled 2014-10-01 (×3): qty 2

## 2014-10-01 MED ORDER — MEPERIDINE HCL 50 MG/ML IJ SOLN: 6.2500 mg | INTRAMUSCULAR | Status: DC | PRN

## 2014-10-01 MED ORDER — METOCLOPRAMIDE HCL 5 MG/ML IJ SOLN
INTRAMUSCULAR | Status: DC | PRN
  Administered 2014-10-01: 10 mg via INTRAVENOUS

## 2014-10-01 MED ORDER — PHENYLEPHRINE 40 MCG/ML (10ML) SYRINGE FOR IV PUSH (FOR BLOOD PRESSURE SUPPORT)
PREFILLED_SYRINGE | INTRAVENOUS | Status: AC
  Filled 2014-10-01: qty 10

## 2014-10-01 MED ORDER — ONDANSETRON HCL 4 MG/2ML IJ SOLN
INTRAMUSCULAR | Status: DC | PRN
  Administered 2014-10-01: 4 mg via INTRAVENOUS

## 2014-10-01 MED ORDER — LEVOFLOXACIN IN D5W 500 MG/100ML IV SOLN
500.0000 mg | INTRAVENOUS | Status: DC
  Administered 2014-10-01: 500 mg via INTRAVENOUS
  Filled 2014-10-01: qty 100

## 2014-10-01 MED ORDER — ONDANSETRON HCL 4 MG/2ML IJ SOLN: 4.0000 mg | Freq: Four times a day (QID) | INTRAMUSCULAR | Status: DC | PRN

## 2014-10-01 MED ORDER — GLYCOPYRROLATE 0.2 MG/ML IJ SOLN
INTRAMUSCULAR | Status: AC
  Filled 2014-10-01: qty 3

## 2014-10-01 MED ORDER — PROPOFOL 10 MG/ML IV BOLUS
INTRAVENOUS | Status: AC
Start: 2014-10-01 — End: 2014-10-01
  Filled 2014-10-01: qty 20

## 2014-10-01 MED ORDER — ONDANSETRON HCL 4 MG/2ML IJ SOLN
INTRAMUSCULAR | Status: AC
  Filled 2014-10-01: qty 2

## 2014-10-01 NOTE — Anesthesia Postprocedure Evaluation (Signed)
Anesthesia Post Note  Patient: [name]  Procedure(s) Performed: Procedure(s) (LRB): MICROWAVE THERMAL ABLATION  (N/A)  Anesthesia type: general  Patient location: PACU  Post pain: Pain level controlled  Post assessment: Patient's Cardiovascular Status Stable  Last Vitals:  Filed Vitals:   10/01/14 1135  BP: 165/62  Pulse: 57  Temp: 36.4 C  Resp: 15    Post vital signs: Reviewed and stable  Level of consciousness: sedated  Complications: No apparent anesthesia complications

## 2014-10-01 NOTE — Transfer of Care (Signed)
Immediate Anesthesia Transfer of Care Note  Patient: [name]  Procedure(s) Performed: Procedure(s): MICROWAVE THERMAL ABLATION  (N/A)  Patient Location: PACU  Anesthesia Type:MAC  Level of Consciousness: awake, sedated and patient cooperative  Airway & Oxygen Therapy: Patient Spontanous Breathing and Patient connected to face mask oxygen  Post-op Assessment: Report given to RN and Post -op Vital signs reviewed and stable  Post vital signs: Reviewed and stable  Last Vitals:  Filed Vitals:   10/01/14 0648  BP: 154/68  Pulse: 70  Temp: 36.1 C  Resp: 20    Complications: No apparent anesthesia complications

## 2014-10-01 NOTE — Progress Notes (Signed)
Day of Surgery  Subjective: Patient doing remarkably well; denies acute complaints at this time  Objective: Vital signs in last 24 hours: Temp:  [97 F (36.1 C)-98 F (36.7 C)] 97.6 F (36.4 C) (03/23 1447) Pulse Rate:  [57-74] 74 (03/23 1447) Resp:  [13-20] 18 (03/23 1447) BP: (139-174)/(50-76) 139/50 mmHg (03/23 1447) SpO2:  [98 %-100 %] 98 % (03/23 1447) Weight:  [164 lb (74.39 kg)] 164 lb (74.39 kg) (03/23 0648) Last BM Date: 10/01/14  Intake/Output from previous day:   Intake/Output this shift: Total I/O In: 1615.8 [P.O.:360; I.V.:1155.8; IV Piggyback:100] Out: 475 [Urine:475]  Puncture site left anterior chest region clean, dry, no hematoma, nontender  Lab Results:   Recent Labs  10/01/14 0655  WBC 9.1  HGB 11.6*  HCT 36.5  PLT 246   BMET  Recent Labs  10/01/14 0655  NA 140  K 4.1  CL 104  CO2 24  GLUCOSE 105*  BUN 30*  CREATININE 1.36*  CALCIUM 9.6   PT/INR No results for input(s): LABPROT, INR in the last 72 hours. ABG No results for input(s): PHART, HCO3 in the last 72 hours.  Invalid input(s): PCO2, PO2  Studies/Results: Dg Chest 1 View  10/01/2014   CLINICAL DATA:  Status post percutaneous microwave thermal ablation of recurrent left upper lobe adenocarcinoma.  EXAM: CHEST  1 VIEW  COMPARISON:  Imaging during CT-guided ablation earlier today and preoperative chest x-ray today.  FINDINGS: Expected postprocedural opacity in the left upper lobe mid present surrounding the ablated left upper lobe carcinoma. No evidence of pneumothorax or pulmonary edema following the procedure. No pleural effusion identified. Cardiac and mediastinal contours are within normal limits.  IMPRESSION: No pneumothorax identified after left upper lobe lung ablation. Expected postprocedural opacity surrounds the treated left upper lobe carcinoma.   Electronically Signed   By: Aletta Edouard M.D.   On: 10/01/2014 13:11   Dg Chest 1 View  10/01/2014   CLINICAL DATA:  Preop  evaluation.  Left upper lobe nodule.  EXAM: CHEST  1 VIEW  COMPARISON:  Chest CT 07/30/2014  FINDINGS: Left upper lobe nodule measures 1.5 x 1.6 cm. There is adjacent linear left upper lobe opacity. Postsurgical change in the right hemithorax. The heart size is normal. Pulmonary vasculature is normal. There is a moderate hiatal hernia. No consolidation. No pleural effusion or pneumothorax.  IMPRESSION: Left upper lobe nodule measures 1.5 x 1.6 cm. Adjacent linear opacity at the left lung apex is unchanged.   Electronically Signed   By: Jeb Levering M.D.   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 She has undergone prior right upper lobectomy and stereotactic radiation therapy of a left upper lobe metachronous lesion and prevascular mediastinal lymph nodes. She presents today for percutaneous thermal ablation of a new, enlarging nodule in the left upper lobe adjacent to the prior radiation field. Her primary lesion was treated with SBRT.  EXAM: CT GUIDED ABLATION  Date: 10/01/2014  PROCEDURE: 1. CT-guided percutaneous thermal ablation of left upper lobe pulmonary nodule Interventional Radiologist:  Criselda Peaches, MD  ANESTHESIA/SEDATION: Anesthesia monitored sedation provided by the anesthesiology service.  TECHNIQUE: Informed consent was obtained from the patient following explanation of the procedure, risks, benefits and alternatives. The patient understands, agrees and consents for the procedure. All questions were addressed. A time out was performed.  Maximal barrier sterile technique utilized including  caps, mask, sterile gowns, sterile gloves, large sterile drape, hand hygiene, and Betadine skin prep.  A planning axial CT scan was performed. The left upper lobe pulmonary nodule was successfully identified. A suitable skin entry site was selected and marked.  Local anesthesia was attained by infiltration with 1% lidocaine. Using intermittent CT fluoroscopic guidance, a 15 cm NeuWave PR antenna was advanced through the anterior chest wall and into the left upper lobe pulmonary nodule. Placement was confirmed with axial CT imaging.  Percutaneous thermal ablation was then performed at 65W for 10 minutes. On the post ablation imaging there was an excellent ground-glass attenuation halo surrounding the nodule. Additionally, there was approximately 20% desiccation and shrinkage of the nodule as expected.  The ablation antenna was removed. Final post procedural imaging was again obtained. There is no evidence of pneumothorax. There is very mild very lesional alveolar hemorrhage as expected. The patient tolerated the procedure very well.  COMPLICATIONS: None  IMPRESSION: Technically successful percutaneous thermal ablation of left upper lobe metachronous/recurrent bronchogenic adenocarcinoma.  PLAN: 1. Postprocedural chest x-ray in 2 hours. 2. Admitted for 23 hour observation with chest x-ray in the morning. 3. Clinic follow-up visit with chest x-ray in 2- 4 weeks. 4. Repeat PET-CT in 3 months (end of June 2016). Signed,  Criselda Peaches, MD  [facility]  [facility]   Electronically Signed   By: Jacqulynn Cadet M.D.   On: 10/01/2014 11:38    Anti-infectives: Anti-infectives    Start     Dose/Rate Route Frequency Ordered Stop   10/01/14 1600  levofloxacin (LEVAQUIN) IVPB 500 mg     500 mg 100 mL/hr over 60 Minutes Intravenous Every 24 hours 10/01/14 1405     10/01/14 0645  vancomycin (VANCOCIN) IVPB 1000 mg/200 mL premix     1,000 mg 200 mL/hr over 60 Minutes Intravenous  Once 10/01/14 5465 10/01/14 0915      Assessment/Plan: S/p CT guided ablation of left upper lobe recurrent bronchoalveolar adenocarcinoma; patient for overnight obs; check follow-up chest x-ray in a.m.; patient to follow-up with Dr. Laurence Ferrari  in the [place] clinic in 2-4 weeks. Follow-up PET/CT scan in 3 months.    15 minutes were spent evaluating patient post thermal ablation of left upper lobe lung cancer  Tanysha Quant,D [facility] 10/01/2014

## 2014-10-01 NOTE — H&P (Signed)
Chief Complaint: Lung cancer Referring Physician(s): Dr. Julien Nordmann  History of Present Illness: [name] is a 74 y.o. female with history of multifocal bronchoalveolar adenocarcinoma of lung initially diagnosed in 2010. The patient has had prior right upper lobectomy and stereotactic radiation therapy of a left upper lobe pulmonary nodule and left prevascular mediastinal lymph node. She now has an enlarging left upper lobe spiculated nodule currently measuring 1.5 x 1.6 cm which is adjacent to her prior radiation therapy field consistent with new recurrent adenocarcinoma. She has maximized dosing for radiation therapy in this region and failed to tolerate systemic chemotherapy with Tarceva despite reduced dosing. She presents today following IR consultation with Dr. Laurence Ferrari for elective CT guided percutaneous thermal ablation of the left upper lobe lung nodule.  Past Medical History  Diagnosis Date  . Hypertension   . Tremor     hand- intention tremor  . lung ca dx'd 07/2008    lung- right non small cell, adenocarcinoma w/ broncho- alveolar features  . Melanoma     left leg  . Cervical ca dx'd 1988    surg only  . Radiation 10/11/11-10/21/11    Left upper lobe adenocarcinoma  . Hx of radiation therapy 09/16/13-09/30/13    prevascular lymph node  . LBBB (left bundle branch block)   . Complication of anesthesia     difficulty awakening, dysrhythmia post surgery, (prolonged sedation level)  . Lung cancer     Bilaterally, RUL lobectomy and spot involving both lungs- "cancer"  . Headache     occ. sinus type headaches  . Arthritis     back.  . Macular degeneration     bilateral  . Hyperthyroidism     dr Loanne Drilling- radioactive iodine treatment 2011- resolved    Past Surgical History  Procedure Laterality Date  . Tonsillectomy    . Conization for cervical dysplasia    . Rul lobectomy  2010  . Esophogus stretching    . Cataract extraction, bilateral Bilateral      Allergies: Meperidine hcl; Penicillins; Morphine; Oxycodone-acetaminophen; Propoxyphene n-acetaminophen; and Tramadol hcl  Medications: Prior to Admission medications   Medication Sig Start Date End Date Taking? Authorizing Provider  atenolol (TENORMIN) 25 MG tablet Take 1 tablet (25 mg total) by mouth 2 (two) times daily. 05/07/14  Yes Carlena Bjornstad, MD  diltiazem (DILACOR XR) 240 MG 24 hr capsule Take 1 capsule (240 mg total) by mouth daily. 05/07/14  Yes Carlena Bjornstad, MD  hydrochlorothiazide (HYDRODIURIL) 25 MG tablet Take 1 tablet (25 mg total) by mouth every morning. 05/07/14  Yes Carlena Bjornstad, MD  Aspirin-Salicylamide-Caffeine [facility] HEADACHE POWDER PO) Take 1 packet by mouth 3 (three) times daily as needed (headache.).     Historical Provider, MD  calcium-vitamin D (OSCAL WITH D) 500-200 MG-UNIT per tablet Take 1 tablet by mouth every morning.     Historical Provider, MD  celecoxib (CELEBREX) 200 MG capsule Take 200 mg by mouth daily as needed for mild pain. For pain    Historical Provider, MD  fluticasone (FLONASE) 50 MCG/ACT nasal spray Place 1 spray into both nostrils daily as needed for allergies or rhinitis.    Historical Provider, MD  gabapentin (NEURONTIN) 300 MG capsule Take 300 mg by mouth at bedtime. States she takes 2 hs 07/31/14   Historical Provider, MD  Multiple Vitamin (MULITIVITAMIN WITH MINERALS) TABS Take 1 tablet by mouth every morning.     Historical Provider, MD  prochlorperazine (COMPAZINE) 10 MG tablet  Take 1 tablet (10 mg total) by mouth every 6 (six) hours as needed for nausea or vomiting. Patient taking differently: Take 10 mg by mouth every 6 (six) hours as needed for nausea or vomiting. Not taking presently 08/29/13   Curt Bears, MD    Family History  Problem Relation Age of Onset  . Asthma Mother   . Heart disease Mother   . Thyroid disease Daughter     hypothyroidism  . Cancer Maternal Uncle     lung  . Cancer Maternal Grandfather     lung   . Diabetes Neg Hx   . Coronary artery disease Neg Hx     History   Social History  . Marital Status: Married    Spouse Name: N/A  . Number of Children: 2  . Years of Education: N/A   Occupational History  . homemaker    Social History Main Topics  . Smoking status: Former Smoker -- 1.00 packs/day for 17 years    Types: Cigarettes    Quit date: 09/28/1975  . Smokeless tobacco: Never Used     Comment: 33 yrs ago  . Alcohol Use: Yes     Comment: glass wine daily  . Drug Use: No  . Sexual Activity: Not on file   Other Topics Concern  . None   Social History Personal assistant   Married 1963   4 grandchildren and 2 step-grandchildren   Marriage in good health            Physician Roster:   Oncologist- Dr Julien Nordmann   Surgeon- Dr Arlyce Dice   Gyn- Dr Mable Fill- Dr Allyson Sabal      Review of Systems  Constitutional: Negative for fever and chills.  Respiratory: Negative for cough and shortness of breath.   Cardiovascular: Negative for chest pain.  Gastrointestinal: Negative for nausea, vomiting and abdominal pain.  Genitourinary: Negative for dysuria and hematuria.  Musculoskeletal: Positive for back pain.  Neurological: Negative for headaches.  Hematological: Does not bruise/bleed easily.    Vital Signs: BP 154/68 mmHg  Pulse 70  Temp(Src) 97 F (36.1 C) (Oral)  Resp 20  Ht 5\' 5"  (1.651 m)  Wt 164 lb (74.39 kg)  BMI 27.29 kg/m2  SpO2 98%  Physical Exam  Constitutional: She is oriented to person, place, and time. She appears well-developed and well-nourished.  Cardiovascular: Normal rate and regular rhythm.   Pulmonary/Chest: Effort normal and breath sounds normal.  Abdominal: Soft. Bowel sounds are normal. There is no tenderness.  Musculoskeletal: Normal range of motion. She exhibits no edema.  Neurological: She is alert and oriented to person, place, and time.    Imaging: Dg Chest 1 View  10/01/2014   CLINICAL DATA:  Preop evaluation.  Left  upper lobe nodule.  EXAM: CHEST  1 VIEW  COMPARISON:  Chest CT 07/30/2014  FINDINGS: Left upper lobe nodule measures 1.5 x 1.6 cm. There is adjacent linear left upper lobe opacity. Postsurgical change in the right hemithorax. The heart size is normal. Pulmonary vasculature is normal. There is a moderate hiatal hernia. No consolidation. No pleural effusion or pneumothorax.  IMPRESSION: Left upper lobe nodule measures 1.5 x 1.6 cm. Adjacent linear opacity at the left lung apex is unchanged.   Electronically Signed   By: Jeb Levering M.D.   On: 10/01/2014 07:02    Labs:  CBC:  Recent Labs  07/30/14 1031 09/03/14 0848 09/26/14 1235 10/01/14 0655  WBC 7.4 7.4 10.9* 9.1  HGB 11.0* 11.4* 11.4* 11.6*  HCT 36.6 37.2 37.5 36.5  PLT 215 221 279 246    COAGS:  Recent Labs  09/26/14 1235  INR 0.93    BMP:  Recent Labs  04/29/14 1010 07/30/14 1033 09/03/14 0848 09/26/14 1235  NA 143 141 142 141  K 3.9 4.1 4.4 4.7  CL  --   --   --  102  CO2 26 29 26 31   GLUCOSE 101 95 95 100*  BUN 24.0 27.5* 24.4 22  CALCIUM 9.8 9.3 9.7 9.5  CREATININE 1.7* 1.4* 1.4* 1.41*  GFRNONAA  --   --   --  36*  GFRAA  --   --   --  42*    LIVER FUNCTION TESTS:  Recent Labs  01/24/14 0900 04/29/14 1010 07/30/14 1033 09/03/14 0848  BILITOT 0.31 0.28 0.26 0.22  AST 16 22 16 17   ALT 18 23 15 14   ALKPHOS 57 58 66 63  PROT 6.0* 6.3* 5.9* 5.9*  ALBUMIN 3.1* 3.5 3.5 3.4*    TUMOR MARKERS: No results for input(s): AFPTM, CEA, CA199, CHROMGRNA in the last 8760 hours.  Assessment and Plan: Patient with history of recurrent left upper lobe lung bronchoalveolar adenocarcinoma. She presents today following IR consultation for elective CT guided anesthesia assisted  percutaneous thermal ablation of the left upper lobe lung nodule . Details/risks of procedure, including but not limited to ,pneumothorax, bleeding, hemoptysis, infection, bronchopleural fistula, cavitation with subsequent chronic  infection and local recurrence as well as death discussed with patient and husband with their apparent understanding and consent. Following the procedure the patient will be admitted for overnight observation.     Signed: Autumn Messing 10/01/2014, 8:20 AM  I spent a total of 30 minutes face to face in clinical consultation, greater than 50% of which was counseling/coordinating care for CT-guided thermal ablation of left upper lobe lung nodule

## 2014-10-01 NOTE — Anesthesia Preprocedure Evaluation (Signed)
Anesthesia Evaluation  Patient identified by MRN, date of birth, ID band Patient awake    Reviewed: Allergy & Precautions, NPO status , Patient's Chart, lab work & pertinent test results  Airway Mallampati: I  TM Distance: >3 FB Neck ROM: Full    Dental   Pulmonary former smoker,          Cardiovascular hypertension, Pt. on medications     Neuro/Psych    GI/Hepatic GERD-  Medicated and Controlled,  Endo/Other    Renal/GU      Musculoskeletal   Abdominal   Peds  Hematology   Anesthesia Other Findings   Reproductive/Obstetrics                             Anesthesia Physical Anesthesia Plan  ASA: III  Anesthesia Plan: MAC   Post-op Pain Management:    Induction: Intravenous  Airway Management Planned: Natural Airway  Additional Equipment:   Intra-op Plan:   Post-operative Plan:   Informed Consent: I have reviewed the patients History and Physical, chart, labs and discussed the procedure including the risks, benefits and alternatives for the proposed anesthesia with the patient or authorized representative who has indicated his/her understanding and acceptance.     Plan Discussed with: CRNA and Surgeon  Anesthesia Plan Comments:         Anesthesia Quick Evaluation

## 2014-10-01 NOTE — Procedures (Signed)
Interventional Radiology Procedure Note  Procedure: Thermal ablation of LUL recurrent adenocarcinoma.    Sedation: MAC with propofol  Complications: None immediate  Estimated Blood Loss: None  Recommendations: - Bedrest x 6 hrs - CXR in 2 hrs to assess for PTX - Admit for observation  Signed,  Criselda Peaches, MD

## 2014-10-02 ENCOUNTER — Observation Stay (HOSPITAL_COMMUNITY): Payer: Medicare Other

## 2014-10-02 ENCOUNTER — Other Ambulatory Visit: Payer: Self-pay | Admitting: Radiology

## 2014-10-02 DIAGNOSIS — Z7982 Long term (current) use of aspirin: Secondary | ICD-10-CM | POA: Diagnosis not present

## 2014-10-02 DIAGNOSIS — R911 Solitary pulmonary nodule: Secondary | ICD-10-CM | POA: Diagnosis not present

## 2014-10-02 DIAGNOSIS — C3412 Malignant neoplasm of upper lobe, left bronchus or lung: Secondary | ICD-10-CM

## 2014-10-02 DIAGNOSIS — I10 Essential (primary) hypertension: Secondary | ICD-10-CM | POA: Diagnosis not present

## 2014-10-02 DIAGNOSIS — Z85820 Personal history of malignant melanoma of skin: Secondary | ICD-10-CM | POA: Diagnosis not present

## 2014-10-02 DIAGNOSIS — E0590 Thyrotoxicosis, unspecified without thyrotoxic crisis or storm: Secondary | ICD-10-CM | POA: Diagnosis not present

## 2014-10-02 DIAGNOSIS — Z09 Encounter for follow-up examination after completed treatment for conditions other than malignant neoplasm: Secondary | ICD-10-CM | POA: Diagnosis not present

## 2014-10-02 DIAGNOSIS — Z8541 Personal history of malignant neoplasm of cervix uteri: Secondary | ICD-10-CM | POA: Diagnosis not present

## 2014-10-02 NOTE — Discharge Summary (Signed)
Patient ID: [name] MRN: [id number] DOB/AGE: 1940-08-27 74 y.o.  Admit date: 10/01/2014 Discharge date: 10/02/2014  Admission Diagnoses: Bronchoalveolar lung adenocarcinoma  Discharge Diagnoses:  Bronchoalveolar lung adenocarcinoma, status post thermal ablation of left upper lobe recurrent adenocarcinoma on 10/01/14 Active Problems:   Local recurrence of left lung cancer   Recurrent lung adenocarcinoma  Past Medical History  Diagnosis Date  . Hypertension   . Tremor     hand- intention tremor  . lung ca dx'd 07/2008    lung- right non small cell, adenocarcinoma w/ broncho- alveolar features  . Melanoma     left leg  . Cervical ca dx'd 1988    surg only  . Radiation 10/11/11-10/21/11    Left upper lobe adenocarcinoma  . Hx of radiation therapy 09/16/13-09/30/13    prevascular lymph node  . LBBB (left bundle branch block)   . Complication of anesthesia     difficulty awakening, dysrhythmia post surgery, (prolonged sedation level)  . Lung cancer     Bilaterally, RUL lobectomy and spot involving both lungs- "cancer"  . Headache     occ. sinus type headaches  . Arthritis     back.  . Macular degeneration     bilateral  . Hyperthyroidism     dr Loanne Drilling- radioactive iodine treatment 2011- resolved   Past Surgical History  Procedure Laterality Date  . Tonsillectomy    . Conization for cervical dysplasia    . Rul lobectomy  2010  . Esophogus stretching    . Cataract extraction, bilateral Bilateral      Discharged Condition: good  Hospital Course: [name] is a 74 year old white female, patient of Dr. Curt Bears, with history of multifocal bronchoalveolar lung adenocarcinoma  initially diagnosed in 2010. She has undergone previous right upper lobectomy and stereotactic radiation therapy of a left upper lobe pulmonary nodule and left prevascular mediastinal lymph node. She now has an enlarging left upper lobe spiculated nodule measuring approximately 1.5 x 1.6  cm adjacent to the prior radiation therapy field consistent with a new recurrent adenocarcinoma. She has maximized dosing for radiation therapy in this region and failed to tolerate systemic chemotherapy with Tarceva despite reduced dosing. She presented on 10/01/14 following IR consultation with Dr. Laurence Ferrari for elective CT guided percutaneous thermal ablation of the left upper lobe lung nodule. On 10/01/14 she underwent successful thermal ablation of left upper lobe recurrent adenocarcinoma via MAC with propofol. The procedure was performed without immediate complications and she was admitted to the hospital for overnight observation. She did well overnight with no significant complaints. On the morning of discharge she denied any new complaints, specifically, chest pain, shortness of breath, hemoptysis,  nausea or vomiting. She was able to void, ambulate and tolerate her diet without difficulty. Follow-up chest x-ray on 10/02/14 was stable. Above findings were discussed with Dr. Laurence Ferrari and the patient was deemed stable for discharge at this time. She will follow-up with Dr. Laurence Ferrari in the interventional radiology clinic in 2-4 weeks. A PET/CT scan will be obtained in approximately 3 months. She was given a prescription for Levaquin 500 mg 1 tablet daily for 1 week. She will continue on her current home medications and follow up with Dr. Julien Nordmann as scheduled. She was told to contact our service in the interim with any questions or concerns.  Consults: none  Significant Diagnostic Studies:  Results for orders placed or performed during the hospital encounter of 10/01/14  CBC with Differential/Platelet  Result Value  Ref Range   WBC 9.1 4.0 - 10.5 K/uL   RBC 4.65 3.87 - 5.11 MIL/uL   Hemoglobin 11.6 (L) 12.0 - 15.0 g/dL   HCT 36.5 36.0 - 46.0 %   MCV 78.5 78.0 - 100.0 fL   MCH 24.9 (L) 26.0 - 34.0 pg   MCHC 31.8 30.0 - 36.0 g/dL   RDW 17.6 (H) 11.5 - 15.5 %   Platelets 246 150 - 400 K/uL    Neutrophils Relative % 55 43 - 77 %   Neutro Abs 4.9 1.7 - 7.7 K/uL   Lymphocytes Relative 22 12 - 46 %   Lymphs Abs 2.0 0.7 - 4.0 K/uL   Monocytes Relative 13 (H) 3 - 12 %   Monocytes Absolute 1.2 (H) 0.1 - 1.0 K/uL   Eosinophils Relative 10 (H) 0 - 5 %   Eosinophils Absolute 0.9 (H) 0.0 - 0.7 K/uL   Basophils Relative 0 0 - 1 %   Basophils Absolute 0.0 0.0 - 0.1 K/uL  Comprehensive metabolic panel  Result Value Ref Range   Sodium 140 135 - 145 mmol/L   Potassium 4.1 3.5 - 5.1 mmol/L   Chloride 104 96 - 112 mmol/L   CO2 24 19 - 32 mmol/L   Glucose, Bld 105 (H) 70 - 99 mg/dL   BUN 30 (H) 6 - 23 mg/dL   Creatinine, Ser 1.36 (H) 0.50 - 1.10 mg/dL   Calcium 9.6 8.4 - 10.5 mg/dL   Total Protein 6.7 6.0 - 8.3 g/dL   Albumin 3.9 3.5 - 5.2 g/dL   AST 39 (H) 0 - 37 U/L   ALT 42 (H) 0 - 35 U/L   Alkaline Phosphatase 59 39 - 117 U/L   Total Bilirubin 0.6 0.3 - 1.2 mg/dL   GFR calc non Af Amer 38 (L) >90 mL/min   GFR calc Af Amer 44 (L) >90 mL/min   Anion gap 12 5 - 15     Treatments: Dg Chest 1 View  10/01/2014   CLINICAL DATA:  Status post percutaneous microwave thermal ablation of recurrent left upper lobe adenocarcinoma.  EXAM: CHEST  1 VIEW  COMPARISON:  Imaging during CT-guided ablation earlier today and preoperative chest x-ray today.  FINDINGS: Expected postprocedural opacity in the left upper lobe mid present surrounding the ablated left upper lobe carcinoma. No evidence of pneumothorax or pulmonary edema following the procedure. No pleural effusion identified. Cardiac and mediastinal contours are within normal limits.  IMPRESSION: No pneumothorax identified after left upper lobe lung ablation. Expected postprocedural opacity surrounds the treated left upper lobe carcinoma.   Electronically Signed   By: Aletta Edouard M.D.   On: 10/01/2014 13:11   Dg Chest 1 View  10/01/2014   CLINICAL DATA:  Preop evaluation.  Left upper lobe nodule.  EXAM: CHEST  1 VIEW  COMPARISON:  Chest CT  07/30/2014  FINDINGS: Left upper lobe nodule measures 1.5 x 1.6 cm. There is adjacent linear left upper lobe opacity. Postsurgical change in the right hemithorax. The heart size is normal. Pulmonary vasculature is normal. There is a moderate hiatal hernia. No consolidation. No pleural effusion or pneumothorax.  IMPRESSION: Left upper lobe nodule measures 1.5 x 1.6 cm. Adjacent linear opacity at the left lung apex is unchanged.   Electronically Signed   By: Jeb Levering M.D.   On: 10/01/2014 07:02   Ct Guide Tissue Ablation  10/01/2014   CLINICAL DATA:  74 year old female with a history of multifocal bronchogenic adenocarcinoma with bronchoalveolar  features. She has undergone prior right upper lobectomy and stereotactic radiation therapy of a left upper lobe metachronous lesion and prevascular mediastinal lymph nodes. She presents today for percutaneous thermal ablation of a new, enlarging nodule in the left upper lobe adjacent to the prior radiation field. Her primary lesion was treated with SBRT.  EXAM: CT GUIDED ABLATION  Date: 10/01/2014  PROCEDURE: 1. CT-guided percutaneous thermal ablation of left upper lobe pulmonary nodule Interventional Radiologist:  Criselda Peaches, MD  ANESTHESIA/SEDATION: Anesthesia monitored sedation provided by the anesthesiology service.  TECHNIQUE: Informed consent was obtained from the patient following explanation of the procedure, risks, benefits and alternatives. The patient understands, agrees and consents for the procedure. All questions were addressed. A time out was performed.  Maximal barrier sterile technique utilized including caps, mask, sterile gowns, sterile gloves, large sterile drape, hand hygiene, and Betadine skin prep.  A planning axial CT scan was performed. The left upper lobe pulmonary nodule was successfully identified. A suitable skin entry site was selected and marked. Local anesthesia was attained by infiltration with 1% lidocaine. Using  intermittent CT fluoroscopic guidance, a 15 cm NeuWave PR antenna was advanced through the anterior chest wall and into the left upper lobe pulmonary nodule. Placement was confirmed with axial CT imaging.  Percutaneous thermal ablation was then performed at 65W for 10 minutes. On the post ablation imaging there was an excellent ground-glass attenuation halo surrounding the nodule. Additionally, there was approximately 20% desiccation and shrinkage of the nodule as expected.  The ablation antenna was removed. Final post procedural imaging was again obtained. There is no evidence of pneumothorax. There is very mild very lesional alveolar hemorrhage as expected. The patient tolerated the procedure very well.  COMPLICATIONS: None  IMPRESSION: Technically successful percutaneous thermal ablation of left upper lobe metachronous/recurrent bronchogenic adenocarcinoma.  PLAN: 1. Postprocedural chest x-ray in 2 hours. 2. Admitted for 23 hour observation with chest x-ray in the morning. 3. Clinic follow-up visit with chest x-ray in 2- 4 weeks. 4. Repeat PET-CT in 3 months (end of June 2016). Signed,  Criselda Peaches, MD  [facility]  [facility]   Electronically Signed   By: Jacqulynn Cadet M.D.   On: 10/01/2014 11:38     Discharge Exam: Blood pressure 157/66, pulse 78, temperature 98 F (36.7 C), temperature source Oral, resp. rate 16, height 5\' 5"  (1.651 m), weight 164 lb (74.39 kg), SpO2 97 %. Patient is awake, alert and oriented. Chest is clear to auscultation bilaterally. Puncture site left upper anterior chest region clean, dry, nontender, no hematoma. Heart with regular rate and rhythm. Abdomen soft, positive bowel sounds, nontender. Extremities with full range of motion and no significant edema.  Disposition: home  Discharge Instructions    Call MD for:  difficulty breathing, headache or visual disturbances    Complete by:  As directed      Call MD for:   extreme fatigue    Complete by:  As directed      Call MD for:  hives    Complete by:  As directed      Call MD for:  persistant dizziness or light-headedness    Complete by:  As directed      Call MD for:  persistant nausea and vomiting    Complete by:  As directed      Call MD for:  redness, tenderness, or signs of infection (pain, swelling, redness, odor or green/yellow discharge around incision site)  Complete by:  As directed      Call MD for:  severe uncontrolled pain    Complete by:  As directed      Call MD for:  temperature >100.4    Complete by:  As directed      Change dressing (specify)    Complete by:  As directed   May keep Band-Aid over puncture site left upper chest region for the next 2-3 days and change daily. May wash site with soap and water.     Diet - low sodium heart healthy    Complete by:  As directed      Driving Restrictions    Complete by:  As directed   May drive starting 9/02     Increase activity slowly    Complete by:  As directed      Lifting restrictions    Complete by:  As directed   Avoid strenuous activity, heavy lifting for the next 3-4 days.     May shower / Bathe    Complete by:  As directed      May walk up steps    Complete by:  As directed             Medication List    STOP taking these medications        BC HEADACHE POWDER PO      TAKE these medications        atenolol 25 MG tablet  Commonly known as:  TENORMIN  Take 1 tablet (25 mg total) by mouth 2 (two) times daily.     calcium-vitamin D 500-200 MG-UNIT per tablet  Commonly known as:  OSCAL WITH D  Take 1 tablet by mouth every morning.     celecoxib 200 MG capsule  Commonly known as:  CELEBREX  Take 200 mg by mouth daily as needed for mild pain. For pain     diltiazem 240 MG 24 hr capsule  Commonly known as:  DILACOR XR  Take 1 capsule (240 mg total) by mouth daily.     fluticasone 50 MCG/ACT nasal spray  Commonly known as:  FLONASE  Place 1 spray into  both nostrils daily as needed for allergies or rhinitis.     gabapentin 300 MG capsule  Commonly known as:  NEURONTIN  Take 300 mg by mouth at bedtime. States she takes 2 hs     hydrochlorothiazide 25 MG tablet  Commonly known as:  HYDRODIURIL  Take 1 tablet (25 mg total) by mouth every morning.     multivitamin with minerals Tabs tablet  Take 1 tablet by mouth every morning.     prochlorperazine 10 MG tablet  Commonly known as:  COMPAZINE  Take 1 tablet (10 mg total) by mouth every 6 (six) hours as needed for nausea or vomiting.           Follow-up Information    Follow up with Jacqulynn Cadet, MD.   Specialty:  Interventional Radiology   Why:  Radiology will call you with follow up appointment with Dr. Laurence Ferrari in 2-4 weeks; please call [phone] or [phone] with any questions or concerns.   Contact information:   [address] [place] [address] [phone]       Follow up with Eilleen Kempf., MD.   Specialty:  Oncology   Why:  Continue follow up with Dr. Julien Nordmann as scheduled   Contact information:   [place] [address] [phone]  I have spent less than 30 minutes coordinating discharge for SANAM MARMO.    SignedRowe Robert, PAC        10/02/2014, 11:02 AM

## 2014-10-02 NOTE — Progress Notes (Signed)
UR completed 

## 2014-10-06 ENCOUNTER — Other Ambulatory Visit (HOSPITAL_COMMUNITY): Payer: Self-pay | Admitting: Interventional Radiology

## 2014-10-06 ENCOUNTER — Other Ambulatory Visit: Payer: Self-pay | Admitting: Radiology

## 2014-10-06 DIAGNOSIS — C3412 Malignant neoplasm of upper lobe, left bronchus or lung: Secondary | ICD-10-CM

## 2014-10-08 ENCOUNTER — Ambulatory Visit (HOSPITAL_BASED_OUTPATIENT_CLINIC_OR_DEPARTMENT_OTHER): Payer: Medicare Other | Admitting: Internal Medicine

## 2014-10-08 ENCOUNTER — Telehealth: Payer: Self-pay | Admitting: Internal Medicine

## 2014-10-08 ENCOUNTER — Encounter: Payer: Self-pay | Admitting: Internal Medicine

## 2014-10-08 VITALS — BP 108/85 | HR 71 | Temp 97.7°F | Resp 18 | Ht 65.0 in | Wt 158.2 lb

## 2014-10-08 DIAGNOSIS — C3412 Malignant neoplasm of upper lobe, left bronchus or lung: Secondary | ICD-10-CM

## 2014-10-08 DIAGNOSIS — M545 Low back pain: Secondary | ICD-10-CM

## 2014-10-08 LAB — CBC WITH DIFFERENTIAL/PLATELET
BASO%: 1 % (ref 0.0–2.0)
Basophils Absolute: 0.1 10*3/uL (ref 0.0–0.1)
EOS ABS: 0.7 10*3/uL — AB (ref 0.0–0.5)
EOS%: 7.7 % — AB (ref 0.0–7.0)
HCT: 36.6 % (ref 34.8–46.6)
HEMOGLOBIN: 11.5 g/dL — AB (ref 11.6–15.9)
LYMPH%: 18.2 % (ref 14.0–49.7)
MCH: 24.3 pg — ABNORMAL LOW (ref 25.1–34.0)
MCHC: 31.3 g/dL — ABNORMAL LOW (ref 31.5–36.0)
MCV: 77.4 fL — AB (ref 79.5–101.0)
MONO#: 1.2 10*3/uL — ABNORMAL HIGH (ref 0.1–0.9)
MONO%: 13.3 % (ref 0.0–14.0)
NEUT#: 5.3 10*3/uL (ref 1.5–6.5)
NEUT%: 59.8 % (ref 38.4–76.8)
Platelets: 241 10*3/uL (ref 145–400)
RBC: 4.73 10*6/uL (ref 3.70–5.45)
RDW: 18.8 % — AB (ref 11.2–14.5)
WBC: 8.8 10*3/uL (ref 3.9–10.3)
lymph#: 1.6 10*3/uL (ref 0.9–3.3)

## 2014-10-08 LAB — COMPREHENSIVE METABOLIC PANEL (CC13)
ALK PHOS: 76 U/L (ref 40–150)
ALT: 17 U/L (ref 0–55)
AST: 16 U/L (ref 5–34)
Albumin: 3.4 g/dL — ABNORMAL LOW (ref 3.5–5.0)
Anion Gap: 11 mEq/L (ref 3–11)
BUN: 20.3 mg/dL (ref 7.0–26.0)
CALCIUM: 9.7 mg/dL (ref 8.4–10.4)
CHLORIDE: 103 meq/L (ref 98–109)
CO2: 27 mEq/L (ref 22–29)
CREATININE: 1.4 mg/dL — AB (ref 0.6–1.1)
EGFR: 36 mL/min/{1.73_m2} — ABNORMAL LOW (ref >90)
Glucose: 88 mg/dl (ref 70–140)
Potassium: 3.6 mEq/L (ref 3.5–5.1)
SODIUM: 141 meq/L (ref 136–145)
Total Bilirubin: 0.38 mg/dL (ref 0.20–1.20)
Total Protein: 6 g/dL — ABNORMAL LOW (ref 6.4–8.3)

## 2014-10-08 NOTE — Progress Notes (Signed)
[place] [phone]) [phone]   [phone]  OFFICE PROGRESS NOTE  No primary care provider on file. No primary provider on file.  PRINCIPAL DIAGNOSES:  1. 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 The patient presented at that time with a right upper lobe lesion, as well as suspicious ground-glass opacities in the left lung. The tumor was negative for EGFR mutation and negative for ALK gene translocation. Veristrat test Good. EGFR mutation studies were conflicting, initial test was negative, second test was positive for EGFR mutation in exon 18 and the the test from [facility] one was negative with positive KRAS mutation in Exon 12. 2. Stage IA malignant melanoma, status post wide excision by Dr. Allyson Sabal on Nov 12, 2008.  PRIOR THERAPY:  1) Status post right upper lobectomy with lymph node dissection under the care of Dr. Arlyce Dice on May 21, 2008.  2) Stereotactic radiotherapy to the left upper lobe lung nodule under the care of Dr. Pablo Ledger expected to be completed on 10/21/2011.  3) stereotactic radiotherapy to the left prevascular lymphadenopathy under the care of Dr. Pablo Ledger completed on 09/26/2013. 4) Tarceva 150 mg by mouth daily started 11/29/2013 discontinued today secondary to intolerance with persistent diarrhea and fatigue. 5) Tarceva 100 mg by mouth daily started on 12/27/2013. Discontinued one week after treatment because of intolerance. 6) status post thermal ablation of the left upper lobe recurrent adenocarcinoma under the care of Dr. Laurence Ferrari on 10/01/2014  CURRENT THERAPY: Observation.  Advanced directives: The patient has advanced directive and no change is requested.  INTERVAL HISTORY: [name] 74 y.o. female returns to the clinic today for follow up visit accompanied by her husband. The patient is feeling much  better today except for persistent low back pain. She recently underwent thermal ablation of the left upper lobe recurrent adenocarcinoma and tolerated the procedure fairly well. She denied having any significant weight loss or night sweats. The patient denied having any fever or chills. She has no nausea or vomiting. She denied having any significant chest pain or hemoptysis.   MEDICAL HISTORY: Past Medical History  Diagnosis Date  . Hypertension   . Tremor     hand- intention tremor  . lung ca dx'd 07/2008    lung- right non small cell, adenocarcinoma w/ broncho- alveolar features  . Melanoma     left leg  . Cervical ca dx'd 1988    surg only  . Radiation 10/11/11-10/21/11    Left upper lobe adenocarcinoma  . Hx of radiation therapy 09/16/13-09/30/13    prevascular lymph node  . LBBB (left bundle branch block)   . Complication of anesthesia     difficulty awakening, dysrhythmia post surgery, (prolonged sedation level)  . Lung cancer     Bilaterally, RUL lobectomy and spot involving both lungs- "cancer"  . Headache     occ. sinus type headaches  . Arthritis     back.  . Macular degeneration     bilateral  . Hyperthyroidism     dr Loanne Drilling- radioactive iodine treatment 2011- resolved    ALLERGIES:  is allergic to meperidine hcl; penicillins; morphine; oxycodone-acetaminophen; propoxyphene n-acetaminophen; and tramadol hcl.  MEDICATIONS:  Current Outpatient Prescriptions  Medication Sig Dispense Refill  . atenolol (TENORMIN) 25 MG tablet Take 1 tablet (25 mg total) by mouth 2 (two) times daily. 180 tablet 3  . calcium-vitamin D (OSCAL WITH D)  500-200 MG-UNIT per tablet Take 1 tablet by mouth every morning.     . celecoxib (CELEBREX) 200 MG capsule Take 200 mg by mouth daily as needed for mild pain. For pain    . diltiazem (DILACOR XR) 240 MG 24 hr capsule Take 1 capsule (240 mg total) by mouth daily. 90 capsule 3  . fluticasone (FLONASE) 50 MCG/ACT nasal spray Place 1 spray into both  nostrils daily as needed for allergies or rhinitis.    Marland Kitchen gabapentin (NEURONTIN) 300 MG capsule Take 300 mg by mouth at bedtime. States she takes 2 hs  2  . hydrochlorothiazide (HYDRODIURIL) 25 MG tablet Take 1 tablet (25 mg total) by mouth every morning. 90 tablet 3  . Multiple Vitamin (MULITIVITAMIN WITH MINERALS) TABS Take 1 tablet by mouth every morning.     . prochlorperazine (COMPAZINE) 10 MG tablet Take 1 tablet (10 mg total) by mouth every 6 (six) hours as needed for nausea or vomiting. (Patient taking differently: Take 10 mg by mouth every 6 (six) hours as needed for nausea or vomiting. Not taking presently) 60 tablet 0   No current facility-administered medications for this visit.    SURGICAL HISTORY:  Past Surgical History  Procedure Laterality Date  . Tonsillectomy    . Conization for cervical dysplasia    . Rul lobectomy  2010  . Esophogus stretching    . Cataract extraction, bilateral Bilateral     REVIEW OF SYSTEMS:  A comprehensive review of systems was negative except for: Musculoskeletal: positive for back pain   PHYSICAL EXAMINATION: General appearance: alert, cooperative and no distress Head: Normocephalic, without obvious abnormality, atraumatic Neck: no adenopathy Lymph nodes: Cervical, supraclavicular, and axillary nodes normal. Resp: clear to auscultation bilaterally Cardio: regular rate and rhythm, S1, S2 normal, no murmur, click, rub or gallop GI: soft, non-tender; bowel sounds normal; no masses,  no organomegaly Extremities: extremities normal, atraumatic, no cyanosis or edema Neurologic: Alert and oriented X 3, normal strength and tone. Normal symmetric reflexes. Normal coordination and gait  ECOG PERFORMANCE STATUS: 1 - Symptomatic but completely ambulatory  There were no vitals taken for this visit.  LABORATORY DATA: Lab Results  Component Value Date   WBC 9.1 10/01/2014   HGB 11.6* 10/01/2014   HCT 36.5 10/01/2014   MCV 78.5 10/01/2014   PLT 246  10/01/2014      Chemistry      Component Value Date/Time   NA 140 10/01/2014 0655   NA 142 11-03-202016 0848   NA 137 01/16/2012 0941   K 4.1 10/01/2014 0655   K 4.4 11-03-202016 0848   K 4.1 01/16/2012 0941   CL 104 10/01/2014 0655   CL 105 07/17/2012 1434   CL 100 01/16/2012 0941   CO2 24 10/01/2014 0655   CO2 26 11-03-202016 0848   CO2 28 01/16/2012 0941   BUN 30* 10/01/2014 0655   BUN 24.4 11-03-202016 0848   BUN 20 01/16/2012 0941   CREATININE 1.36* 10/01/2014 0655   CREATININE 1.4* 11-03-202016 0848   CREATININE 1.2 01/16/2012 0941      Component Value Date/Time   CALCIUM 9.6 10/01/2014 0655   CALCIUM 9.7 11-03-202016 0848   CALCIUM 9.1 01/16/2012 0941   ALKPHOS 59 10/01/2014 0655   ALKPHOS 63 11-03-202016 0848   ALKPHOS 46 01/16/2012 0941   AST 39* 10/01/2014 0655   AST 17 11-03-202016 0848   AST 23 01/16/2012 0941   ALT 42* 10/01/2014 0655   ALT 14 11-03-202016 0848  ALT 21 01/16/2012 0941   BILITOT 0.6 10/01/2014 0655   BILITOT 0.22 2020/02/1915 0848   BILITOT 0.60 01/16/2012 0941       RADIOGRAPHIC STUDIES: Dg Chest 1 View  10/01/2014   CLINICAL DATA:  Status post percutaneous microwave thermal ablation of recurrent left upper lobe adenocarcinoma.  EXAM: CHEST  1 VIEW  COMPARISON:  Imaging during CT-guided ablation earlier today and preoperative chest x-ray today.  FINDINGS: Expected postprocedural opacity in the left upper lobe mid present surrounding the ablated left upper lobe carcinoma. No evidence of pneumothorax or pulmonary edema following the procedure. No pleural effusion identified. Cardiac and mediastinal contours are within normal limits.  IMPRESSION: No pneumothorax identified after left upper lobe lung ablation. Expected postprocedural opacity surrounds the treated left upper lobe carcinoma.   Electronically Signed   By: Aletta Edouard M.D.   On: 10/01/2014 13:11   Dg Chest 1 View  10/01/2014   CLINICAL DATA:  Preop evaluation.  Left upper lobe nodule.  EXAM:  CHEST  1 VIEW  COMPARISON:  Chest CT 07/30/2014  FINDINGS: Left upper lobe nodule measures 1.5 x 1.6 cm. There is adjacent linear left upper lobe opacity. Postsurgical change in the right hemithorax. The heart size is normal. Pulmonary vasculature is normal. There is a moderate hiatal hernia. No consolidation. No pleural effusion or pneumothorax.  IMPRESSION: Left upper lobe nodule measures 1.5 x 1.6 cm. Adjacent linear opacity at the left lung apex is unchanged.   Electronically Signed   By: Jeb Levering M.D.   On: 10/01/2014 07:02   Dg Chest 2 View  10/02/2014   CLINICAL DATA:  Prior percutaneous ablation of recurrent left upper lobe adenocarcinoma.  EXAM: CHEST  2 VIEW  COMPARISON:  10/01/2014.  FINDINGS: Mediastinum and hilar structures are stable. Stable left upper lobe postprocedure changes. No pneumothorax. No acute pulmonary infiltrate. Heart size stable. Sliding hiatal hernia .  IMPRESSION: Stable left upper lobe postprocedural changes.  No pneumothorax.   Electronically Signed   By: Marcello Moores  Register   On: 10/02/2014 12:17   Ct Guide Tissue Ablation  10/01/2014   CLINICAL DATA:  74 year old female with a history of multifocal bronchogenic adenocarcinoma with bronchoalveolar features. She has undergone prior right upper lobectomy and stereotactic radiation therapy of a left upper lobe metachronous lesion and prevascular mediastinal lymph nodes. She presents today for percutaneous thermal ablation of a new, enlarging nodule in the left upper lobe adjacent to the prior radiation field. Her primary lesion was treated with SBRT.  EXAM: CT GUIDED ABLATION  Date: 10/01/2014  PROCEDURE: 1. CT-guided percutaneous thermal ablation of left upper lobe pulmonary nodule Interventional Radiologist:  Criselda Peaches, MD  ANESTHESIA/SEDATION: Anesthesia monitored sedation provided by the anesthesiology service.  TECHNIQUE: Informed consent was obtained from the patient following explanation of the procedure,  risks, benefits and alternatives. The patient understands, agrees and consents for the procedure. All questions were addressed. A time out was performed.  Maximal barrier sterile technique utilized including caps, mask, sterile gowns, sterile gloves, large sterile drape, hand hygiene, and Betadine skin prep.  A planning axial CT scan was performed. The left upper lobe pulmonary nodule was successfully identified. A suitable skin entry site was selected and marked. Local anesthesia was attained by infiltration with 1% lidocaine. Using intermittent CT fluoroscopic guidance, a 15 cm NeuWave PR antenna was advanced through the anterior chest wall and into the left upper lobe pulmonary nodule. Placement was confirmed with axial CT imaging.  Percutaneous thermal ablation  was then performed at 65W for 10 minutes. On the post ablation imaging there was an excellent ground-glass attenuation halo surrounding the nodule. Additionally, there was approximately 20% desiccation and shrinkage of the nodule as expected.  The ablation antenna was removed. Final post procedural imaging was again obtained. There is no evidence of pneumothorax. There is very mild very lesional alveolar hemorrhage as expected. The patient tolerated the procedure very well.  COMPLICATIONS: None  IMPRESSION: Technically successful percutaneous thermal ablation of left upper lobe metachronous/recurrent bronchogenic adenocarcinoma.  PLAN: 1. Postprocedural chest x-ray in 2 hours. 2. Admitted for 23 hour observation with chest x-ray in the morning. 3. Clinic follow-up visit with chest x-ray in 2- 4 weeks. 4. Repeat PET-CT in 3 months (end of June 2016). Signed,  Criselda Peaches, MD  [facility]  [facility]   Electronically Signed   By: Jacqulynn Cadet M.D.   On: 10/01/2014 11:38   ASSESSMENT AND PLAN: This is a very pleasant 74 years old white female with recurrent non-small cell lung cancer,  adenocarcinoma with conflicting reports regarding the EGFR mutation including the recent one from the biopsy of the left upper lobe lung nodule that showed mutation at exon 18. She recently completed a course of stereotactic radiotherapy to the left prevascular lymphadenopathy under the care of Dr. Pablo Ledger. She was recently treated with a course of Tarceva initially at 150 mg by mouth daily for one month followed by 1 week treatment at a reduced dose of 100 mg by mouth daily but unfortunately the patient was unable to tolerate her treatment and this was discontinued. The recent CT scan of the chest showed evidence for mild enlargement of the left upper lobe lung nodule with stable disease in other areas. She is not a candidate for repeat stereotactic radiotherapy to this lesion.  She underwent thermal ablation of the left upper lobe nodule by interventional radiology and tolerated the procedure well. I recommended for her to continue on observation. We will repeat a PET scan in 3 months for reevaluation of her disease and the response to the previous treatment. She will come back for follow-up visit at that time. She was advised to call immediately if she has any concerning symptoms in the interval. All questions were answered. The patient knows to call the clinic with any problems, questions or concerns. We can certainly see the patient much sooner if necessary.  Disclaimer: This note was dictated with voice recognition software. Similar sounding words can inadvertently be transcribed and may not be corrected upon review.

## 2014-10-08 NOTE — Telephone Encounter (Signed)
Pt confirmed labs/ov per 03/30 POF, gave pt AVS and Calendar...Marland KitchenMarland KitchenCherylann Banas, sent msg to MD concerning labs/md/CT in April per pt's request, will contact pt through my chart once I hear from MD.... KJ

## 2014-10-09 DIAGNOSIS — M545 Low back pain: Secondary | ICD-10-CM | POA: Diagnosis not present

## 2014-10-09 DIAGNOSIS — M47816 Spondylosis without myelopathy or radiculopathy, lumbar region: Secondary | ICD-10-CM | POA: Diagnosis not present

## 2014-10-09 DIAGNOSIS — M76829 Posterior tibial tendinitis, unspecified leg: Secondary | ICD-10-CM | POA: Diagnosis not present

## 2014-10-09 DIAGNOSIS — M722 Plantar fascial fibromatosis: Secondary | ICD-10-CM | POA: Diagnosis not present

## 2014-10-09 NOTE — Progress Notes (Signed)
This encounter was created in error - please disregard.

## 2014-10-13 DIAGNOSIS — M47897 Other spondylosis, lumbosacral region: Secondary | ICD-10-CM | POA: Diagnosis not present

## 2014-10-21 ENCOUNTER — Ambulatory Visit
Admission: RE | Admit: 2014-10-21 | Discharge: 2014-10-21 | Disposition: A | Discharge status: Home / Self Care | Payer: Medicare Other | Source: Ambulatory Visit | Attending: Radiology | Admitting: Radiology

## 2014-10-21 ENCOUNTER — Ambulatory Visit
Admission: RE | Admit: 2014-10-21 | Discharge: 2014-10-21 | Disposition: A | Discharge status: Home / Self Care | Payer: Medicare Other | Source: Ambulatory Visit | Attending: Interventional Radiology | Admitting: Interventional Radiology

## 2014-10-21 DIAGNOSIS — R918 Other nonspecific abnormal finding of lung field: Secondary | ICD-10-CM | POA: Diagnosis not present

## 2014-10-21 DIAGNOSIS — C3412 Malignant neoplasm of upper lobe, left bronchus or lung: Secondary | ICD-10-CM | POA: Diagnosis not present

## 2014-10-21 NOTE — Progress Notes (Signed)
Chief Complaint: Chief Complaint  Patient presents with  . Follow-up    3 wk follow up [place] of Left Pulmonary Nodule    Referring Physician(s): Talli Kimmer  History of Present Illness: [name] is a 74 y.o. female with a history of recurrent non-small cell lung cancer initially diagnosed as stage I A (T1aN0MX) adenocarcinoma with broncho-alveolar features in the right upper lobe. At that same time, she had suspicious but nondiagnostic ground glass attenuation opacities in the left lung.  She underwent a right upper lobectomy with lymph node dissection. Subsequently, she was diagnosed with metachronous adenocarcinoma in the left upper lobe in early 2013. She underwent stereotactic radiotherapy performed by Dr. Pablo Ledger of radiation oncology (60 Gy completed April 2013). A metastatic mediastinal node was diagnosed and she was treated with an additional 50 Gy completed in March 2015. Adjuvant chemotherapy (Tarceva) was initiated on 11/29/2013 but discontinued shortly thereafter due to intolerance despite dose reduction.  Subsequent follow-up imaging shows excellent response of her mediastinal node but an enlarging spiculated nodule in the left upper lobe at the margin of her prior radiation zone. She was evaluated by Dr. Pablo Ledger and any further SBRT would result in overlap of the treatment zones and potential complications including bronchopleural fistula or aortitis. Her case was then discussed at the multidisciplinary tumor board and she was felt to be a candidate for percutaneous ablation which was performed on 10/01/2014. [name] presents today for her scheduled follow-up evaluation.  [name] continues to do very well. She is completely asymptomatic and denies chest pain, shortness of breath, cough, hemoptysis, fever or chills. She is completely ambulatory and able to perform all of her activities of daily living. She has a [place] trip planned in the near future  as well as several events throughout the spring and summer which she is looking forward to.    Past Medical History  Diagnosis Date  . Hypertension   . Tremor     hand- intention tremor  . lung ca dx'd 07/2008    lung- right non small cell, adenocarcinoma w/ broncho- alveolar features  . Melanoma     left leg  . Cervical ca dx'd 1988    surg only  . Radiation 10/11/11-10/21/11    Left upper lobe adenocarcinoma  . Hx of radiation therapy 09/16/13-09/30/13    prevascular lymph node  . LBBB (left bundle branch block)   . Complication of anesthesia     difficulty awakening, dysrhythmia post surgery, (prolonged sedation level)  . Lung cancer     Bilaterally, RUL lobectomy and spot involving both lungs- "cancer"  . Headache     occ. sinus type headaches  . Arthritis     back.  . Macular degeneration     bilateral  . Hyperthyroidism     dr Loanne Drilling- radioactive iodine treatment 2011- resolved    Past Surgical History  Procedure Laterality Date  . Tonsillectomy    . Conization for cervical dysplasia    . Rul lobectomy  2010  . Esophogus stretching    . Cataract extraction, bilateral Bilateral     Allergies: Meperidine hcl; Penicillins; Morphine; Oxycodone-acetaminophen; Propoxyphene n-acetaminophen; and Tramadol hcl  Medications: Prior to Admission medications   Medication Sig Start Date End Date Taking? Authorizing Provider  acetaminophen (TYLENOL) 650 MG CR tablet Take 650 mg by mouth every 8 (eight) hours as needed for pain.   Yes Historical Provider, MD  Aspirin-Salicylamide-Caffeine (BC HEADACHE POWDER PO) Take 1 Package by  mouth daily as needed.   Yes Historical Provider, MD  atenolol (TENORMIN) 25 MG tablet Take 1 tablet (25 mg total) by mouth 2 (two) times daily. 05/07/14  Yes Carlena Bjornstad, MD  calcium-vitamin D (OSCAL WITH D) 500-200 MG-UNIT per tablet Take 1 tablet by mouth every morning.    Yes Historical Provider, MD  celecoxib (CELEBREX) 200 MG capsule Take 200 mg by  mouth daily as needed for mild pain. For pain   Yes Historical Provider, MD  diltiazem (DILACOR XR) 240 MG 24 hr capsule Take 1 capsule (240 mg total) by mouth daily. 05/07/14  Yes Carlena Bjornstad, MD  fluticasone [facility]) 50 MCG/ACT nasal spray Place 1 spray into both nostrils daily as needed for allergies or rhinitis.   Yes Historical Provider, MD  gabapentin (NEURONTIN) 300 MG capsule Take 300 mg by mouth at bedtime. States she takes 2 hs 07/31/14  Yes Historical Provider, MD  hydrochlorothiazide (HYDRODIURIL) 25 MG tablet Take 1 tablet (25 mg total) by mouth every morning. 05/07/14  Yes Carlena Bjornstad, MD  Multiple Vitamin (MULITIVITAMIN WITH MINERALS) TABS Take 1 tablet by mouth every morning.    Yes Historical Provider, MD     Family History  Problem Relation Age of Onset  . Asthma Mother   . Heart disease Mother   . Thyroid disease Daughter     hypothyroidism  . Cancer Maternal Uncle     lung  . Cancer Maternal Grandfather     lung  . Diabetes Neg Hx   . Coronary artery disease Neg Hx     History   Social History  . Marital Status: Married    Spouse Name: N/A  . Number of Children: 2  . Years of Education: N/A   Occupational History  . homemaker    Social History Main Topics  . Smoking status: Former Smoker -- 1.00 packs/day for 17 years    Types: Cigarettes    Quit date: 09/28/1975  . Smokeless tobacco: Never Used     Comment: 33 yrs ago  . Alcohol Use: Yes     Comment: glass wine daily  . Drug Use: No  . Sexual Activity: Not on file   Other Topics Concern  . Not on file   Social History Narrative   [employer] graduate   Married 1963   4 grandchildren and 2 step-grandchildren   Marriage in good health            Physician Roster:   Oncologist- Dr Julien Nordmann   Surgeon- Dr Arlyce Dice   Gyn- Dr Mable Fill- Dr Marlowe Shores Status: 0 - Asymptomatic  Review of Systems: A 12 point ROS discussed and pertinent positives are indicated in the HPI above.  All other  systems are negative.  Review of Systems  Vital Signs: BP 185/71 mmHg  Pulse 56  Temp(Src) 97.7 F (36.5 C) (Oral)  Resp 14  SpO2 99%  Physical Exam  Constitutional: She is oriented to person, place, and time. She appears well-developed and well-nourished. No distress.  HENT:  Head: Normocephalic and atraumatic.  Eyes: No scleral icterus.  Cardiovascular: Normal rate.   Pulmonary/Chest: Effort normal and breath sounds normal.  Neurological: She is alert and oriented to person, place, and time.  Skin: Skin is warm and dry.  Psychiatric: She has a normal mood and affect. Her behavior is normal.  Nursing note and vitals reviewed.   Imaging:   Labs:  CBC:  Recent Labs  09/03/14 0848 09/26/14 1235 10/01/14 0655 10/08/14 0919  WBC 7.4 10.9* 9.1 8.8  HGB 11.4* 11.4* 11.6* 11.5*  HCT 37.2 37.5 36.5 36.6  PLT 221 279 246 241    COAGS:  Recent Labs  09/26/14 1235  INR 0.93    BMP:  Recent Labs  09/03/14 0848 09/26/14 1235 10/01/14 0655 10/08/14 0919  NA 142 141 140 141  K 4.4 4.7 4.1 3.6  CL  --  102 104  --   CO2 26 31 24 27   GLUCOSE 95 100* 105* 88  BUN 24.4 22 30* 20.3  CALCIUM 9.7 9.5 9.6 9.7  CREATININE 1.4* 1.41* 1.36* 1.4*  GFRNONAA  --  36* 38*  --   GFRAA  --  42* 44*  --     LIVER FUNCTION TESTS:  Recent Labs  07/30/14 1033 09/03/14 0848 10/01/14 0655 10/08/14 0919  BILITOT 0.26 0.22 0.6 0.38  AST 16 17 39* 16  ALT 15 14 42* 17  ALKPHOS 66 63 59 76  PROT 5.9* 5.9* 6.7 6.0*  ALBUMIN 3.5 3.4* 3.9 3.4*    TUMOR MARKERS: No results for input(s): AFPTM, CEA, CA199, CHROMGRNA in the last 8760 hours.  Assessment and Plan:  [name] is doing exceptionally well 3 weeks status post percutaneous thermal ablation of left upper lobe adenocarcinoma. She is completely asymptomatic and has recovered without adverse event.  Plan for PET CT to evaluate for residual disease at 3 months postprocedure. This has already been scheduled by Dr.  Julien Nordmann for late June and she has an appointment to see him the following week to get the results.  I will also review her PET/CT. If she is disease free, we will continue to follow her every 3-6 months. If her PET/CT shows any residual disease for which additional treatment may be required, I will have her come in to the office for a visit and to discuss.  SignedJacqulynn Cadet 10/21/2014, 5:16 PM   I spent a total of  10 Minutes in face to face in clinical consultation, greater than 50% of which was counseling/coordinating care for recurrent lung adenocarcinoma.

## 2014-10-27 ENCOUNTER — Other Ambulatory Visit: Payer: Medicare Other

## 2014-10-27 ENCOUNTER — Ambulatory Visit (HOSPITAL_COMMUNITY): Payer: Medicare Other

## 2014-10-28 ENCOUNTER — Telehealth: Payer: Self-pay | Admitting: *Deleted

## 2014-10-28 ENCOUNTER — Other Ambulatory Visit: Payer: Self-pay | Admitting: *Deleted

## 2014-10-28 ENCOUNTER — Telehealth: Payer: Self-pay | Admitting: Internal Medicine

## 2014-10-28 NOTE — Telephone Encounter (Signed)
Pt no show to CT scan 4/18, called pt who states, I cancelled that at my last visit because me and the MD discussed a PET scan in jun and f/u with him in July. I also cancelled the appt on Monday with him. Confirmed with pt to cancel MD appt and an CT . POF to [place]

## 2014-10-28 NOTE — Telephone Encounter (Signed)
Per pof cancel 4/25 appointment,done  anne

## 2014-10-29 ENCOUNTER — Other Ambulatory Visit (HOSPITAL_COMMUNITY): Payer: Medicare Other

## 2014-11-03 ENCOUNTER — Ambulatory Visit: Payer: Medicare Other | Admitting: Internal Medicine

## 2014-11-06 DIAGNOSIS — M25562 Pain in left knee: Secondary | ICD-10-CM | POA: Diagnosis not present

## 2014-11-06 DIAGNOSIS — M25561 Pain in right knee: Secondary | ICD-10-CM | POA: Diagnosis not present

## 2014-11-27 DIAGNOSIS — T1502XA Foreign body in cornea, left eye, initial encounter: Secondary | ICD-10-CM | POA: Diagnosis not present

## 2014-12-03 DIAGNOSIS — M19071 Primary osteoarthritis, right ankle and foot: Secondary | ICD-10-CM | POA: Diagnosis not present

## 2014-12-15 DIAGNOSIS — C44119 Basal cell carcinoma of skin of left eyelid, including canthus: Secondary | ICD-10-CM | POA: Diagnosis not present

## 2014-12-15 DIAGNOSIS — D485 Neoplasm of uncertain behavior of skin: Secondary | ICD-10-CM | POA: Diagnosis not present

## 2014-12-22 ENCOUNTER — Telehealth: Payer: Self-pay | Admitting: Internal Medicine

## 2014-12-22 NOTE — Telephone Encounter (Signed)
AM PAL - moved 7/5 f/u to later in the day and per pt moved 6/28 lab to 6/27 with pet scan. Spoke with patient she is aware of both appointments for 6/27 lab and 7/5 f/u.

## 2015-01-05 ENCOUNTER — Ambulatory Visit (HOSPITAL_COMMUNITY)
Admission: RE | Admit: 2015-01-05 | Discharge: 2015-01-05 | Disposition: A | Discharge status: Home / Self Care | Payer: Medicare Other | Source: Ambulatory Visit | Attending: Internal Medicine | Admitting: Internal Medicine

## 2015-01-05 ENCOUNTER — Other Ambulatory Visit (HOSPITAL_BASED_OUTPATIENT_CLINIC_OR_DEPARTMENT_OTHER): Payer: Medicare Other

## 2015-01-05 DIAGNOSIS — C3412 Malignant neoplasm of upper lobe, left bronchus or lung: Secondary | ICD-10-CM

## 2015-01-05 DIAGNOSIS — Z08 Encounter for follow-up examination after completed treatment for malignant neoplasm: Secondary | ICD-10-CM | POA: Insufficient documentation

## 2015-01-05 DIAGNOSIS — I2510 Atherosclerotic heart disease of native coronary artery without angina pectoris: Secondary | ICD-10-CM | POA: Insufficient documentation

## 2015-01-05 LAB — CBC WITH DIFFERENTIAL/PLATELET
BASO%: 0.8 % (ref 0.0–2.0)
Basophils Absolute: 0.1 10*3/uL (ref 0.0–0.1)
EOS%: 13.8 % — ABNORMAL HIGH (ref 0.0–7.0)
Eosinophils Absolute: 0.9 10*3/uL — ABNORMAL HIGH (ref 0.0–0.5)
HEMATOCRIT: 36.2 % (ref 34.8–46.6)
HGB: 11.1 g/dL — ABNORMAL LOW (ref 11.6–15.9)
LYMPH#: 1.8 10*3/uL (ref 0.9–3.3)
LYMPH%: 27.8 % (ref 14.0–49.7)
MCH: 25 pg — ABNORMAL LOW (ref 25.1–34.0)
MCHC: 30.7 g/dL — AB (ref 31.5–36.0)
MCV: 81.5 fL (ref 79.5–101.0)
MONO#: 1 10*3/uL — AB (ref 0.1–0.9)
MONO%: 15.7 % — ABNORMAL HIGH (ref 0.0–14.0)
NEUT#: 2.7 10*3/uL (ref 1.5–6.5)
NEUT%: 41.9 % (ref 38.4–76.8)
Platelets: 237 10*3/uL (ref 145–400)
RBC: 4.44 10*6/uL (ref 3.70–5.45)
RDW: 16.1 % — AB (ref 11.2–14.5)
WBC: 6.5 10*3/uL (ref 3.9–10.3)

## 2015-01-05 LAB — COMPREHENSIVE METABOLIC PANEL (CC13)
ALK PHOS: 59 U/L (ref 40–150)
ALT: 20 U/L (ref 0–55)
AST: 18 U/L (ref 5–34)
Albumin: 3.5 g/dL (ref 3.5–5.0)
Anion Gap: 7 mEq/L (ref 3–11)
BUN: 22.2 mg/dL (ref 7.0–26.0)
CO2: 29 mEq/L (ref 22–29)
CREATININE: 1.3 mg/dL — AB (ref 0.6–1.1)
Calcium: 9.2 mg/dL (ref 8.4–10.4)
Chloride: 108 mEq/L (ref 98–109)
EGFR: 39 mL/min/{1.73_m2} — ABNORMAL LOW (ref >90)
Glucose: 92 mg/dl (ref 70–140)
Potassium: 4 mEq/L (ref 3.5–5.1)
Sodium: 144 mEq/L (ref 136–145)
Total Bilirubin: 0.25 mg/dL (ref 0.20–1.20)
Total Protein: 6 g/dL — ABNORMAL LOW (ref 6.4–8.3)

## 2015-01-05 LAB — GLUCOSE, CAPILLARY: Glucose-Capillary: 94 mg/dL (ref 65–99)

## 2015-01-05 MED ORDER — FLUDEOXYGLUCOSE F - 18 (FDG) INJECTION
8.8000 | Freq: Once | INTRAVENOUS | Status: AC | PRN
  Administered 2015-01-05: 8.8 via INTRAVENOUS

## 2015-01-06 ENCOUNTER — Other Ambulatory Visit: Payer: Medicare Other

## 2015-01-13 ENCOUNTER — Ambulatory Visit (HOSPITAL_BASED_OUTPATIENT_CLINIC_OR_DEPARTMENT_OTHER): Payer: Medicare Other | Admitting: Internal Medicine

## 2015-01-13 ENCOUNTER — Telehealth: Payer: Self-pay | Admitting: Internal Medicine

## 2015-01-13 ENCOUNTER — Encounter: Payer: Self-pay | Admitting: Internal Medicine

## 2015-01-13 VITALS — BP 141/57 | HR 67 | Temp 98.6°F | Resp 18 | Ht 65.0 in | Wt 159.8 lb

## 2015-01-13 DIAGNOSIS — C3412 Malignant neoplasm of upper lobe, left bronchus or lung: Secondary | ICD-10-CM

## 2015-01-13 DIAGNOSIS — M545 Low back pain: Secondary | ICD-10-CM | POA: Diagnosis not present

## 2015-01-13 DIAGNOSIS — C439 Malignant melanoma of skin, unspecified: Secondary | ICD-10-CM

## 2015-01-13 DIAGNOSIS — Z85820 Personal history of malignant melanoma of skin: Secondary | ICD-10-CM

## 2015-01-13 NOTE — Telephone Encounter (Signed)
Gave avs & calendar for January °

## 2015-01-13 NOTE — Progress Notes (Signed)
[place] [phone]   Fax:(336) [phone]  OFFICE PROGRESS NOTE  No primary care provider on file. No primary provider on file.  PRINCIPAL DIAGNOSES:  1. 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 The patient presented at that time with a right upper lobe lesion, as well as suspicious ground-glass opacities in the left lung. The tumor was negative for EGFR mutation and negative for ALK gene translocation. Veristrat test Good. EGFR mutation studies were conflicting, initial test was negative, second test was positive for EGFR mutation in exon 18 and the the test from [facility] one was negative with positive KRAS mutation in Exon 12. 2. Stage IA malignant melanoma, status post wide excision by Dr. Allyson Sabal on Nov 12, 2008.  PRIOR THERAPY:  1) Status post right upper lobectomy with lymph node dissection under the care of Dr. Arlyce Dice on May 21, 2008.  2) Stereotactic radiotherapy to the left upper lobe lung nodule under the care of Dr. Pablo Ledger expected to be completed on 10/21/2011.  3) stereotactic radiotherapy to the left prevascular lymphadenopathy under the care of Dr. Pablo Ledger completed on 09/26/2013. 4) Tarceva 150 mg by mouth daily started 11/29/2013 discontinued today secondary to intolerance with persistent diarrhea and fatigue. 5) Tarceva 100 mg by mouth daily started on 12/27/2013. Discontinued one week after treatment because of intolerance. 6) status post thermal ablation of the left upper lobe recurrent adenocarcinoma under the care of Dr. Laurence Ferrari on 10/01/2014  CURRENT THERAPY: Observation.  Advanced directives: The patient has advanced directive and no change is requested.  INTERVAL HISTORY: [name] 74 y.o. female returns to the clinic today for follow up visit accompanied by her husband. The patient is feeling much  better today except for persistent low back pain. She denied having any significant weight loss or night sweats. The patient denied having any fever or chills. She has no nausea or vomiting. She denied having any significant chest pain, shortness of breath, cough or hemoptysis. She had repeat PET scan performed recently and she is here for evaluation and discussion of her scan results.  MEDICAL HISTORY: Past Medical History  Diagnosis Date  . Hypertension   . Tremor     hand- intention tremor  . lung ca dx'd 07/2008    lung- right non small cell, adenocarcinoma w/ broncho- alveolar features  . Melanoma     left leg  . Cervical ca dx'd 1988    surg only  . Radiation 10/11/11-10/21/11    Left upper lobe adenocarcinoma  . Hx of radiation therapy 09/16/13-09/30/13    prevascular lymph node  . LBBB (left bundle branch block)   . Complication of anesthesia     difficulty awakening, dysrhythmia post surgery, (prolonged sedation level)  . Lung cancer     Bilaterally, RUL lobectomy and spot involving both lungs- "cancer"  . Headache     occ. sinus type headaches  . Arthritis     back.  . Macular degeneration     bilateral  . Hyperthyroidism     dr Loanne Drilling- radioactive iodine treatment 2011- resolved    ALLERGIES:  is allergic to meperidine hcl; penicillins; morphine; oxycodone-acetaminophen; propoxyphene n-acetaminophen; and tramadol hcl.  MEDICATIONS:  Current Outpatient Prescriptions  Medication Sig Dispense Refill  . acetaminophen (TYLENOL) 650 MG CR tablet Take 650 mg by mouth every 8 (eight) hours as needed for pain.    Marland Kitchen  Aspirin-Salicylamide-Caffeine (BC HEADACHE POWDER PO) Take 1 Package by mouth daily as needed.    Marland Kitchen atenolol (TENORMIN) 25 MG tablet Take 1 tablet (25 mg total) by mouth 2 (two) times daily. 180 tablet 3  . calcium-vitamin D (OSCAL WITH D) 500-200 MG-UNIT per tablet Take 1 tablet by mouth every morning.     . celecoxib (CELEBREX) 200 MG capsule Take 200 mg by mouth  daily as needed for mild pain. For pain    . diltiazem (DILACOR XR) 240 MG 24 hr capsule Take 1 capsule (240 mg total) by mouth daily. 90 capsule 3  . fluticasone (FLONASE) 50 MCG/ACT nasal spray Place 1 spray into both nostrils daily as needed for allergies or rhinitis.    Marland Kitchen gabapentin (NEURONTIN) 300 MG capsule Take 300 mg by mouth at bedtime. States she takes 2 hs  2  . hydrochlorothiazide (HYDRODIURIL) 25 MG tablet Take 1 tablet (25 mg total) by mouth every morning. 90 tablet 3  . Multiple Vitamin (MULITIVITAMIN WITH MINERALS) TABS Take 1 tablet by mouth every morning.     . neomycin-polymyxin b-dexamethasone (MAXITROL) 3.5-10000-0.1 OINT   1  . predniSONE (STERAPRED UNI-PAK) 10 MG tablet See admin instructions. follow package directions  1  . tiZANidine (ZANAFLEX) 4 MG tablet TK 1/2 TO 1  T PO Q 8 HOURS PRN. NOT TO EXCEED 3 TABLETS IN 24 HOURS.  1   No current facility-administered medications for this visit.    SURGICAL HISTORY:  Past Surgical History  Procedure Laterality Date  . Tonsillectomy    . Conization for cervical dysplasia    . Rul lobectomy  2010  . Esophogus stretching    . Cataract extraction, bilateral Bilateral     REVIEW OF SYSTEMS:  A comprehensive review of systems was negative except for: Musculoskeletal: positive for back pain   PHYSICAL EXAMINATION: General appearance: alert, cooperative and no distress Head: Normocephalic, without obvious abnormality, atraumatic Neck: no adenopathy Lymph nodes: Cervical, supraclavicular, and axillary nodes normal. Resp: clear to auscultation bilaterally Cardio: regular rate and rhythm, S1, S2 normal, no murmur, click, rub or gallop GI: soft, non-tender; bowel sounds normal; no masses,  no organomegaly Extremities: extremities normal, atraumatic, no cyanosis or edema Neurologic: Alert and oriented X 3, normal strength and tone. Normal symmetric reflexes. Normal coordination and gait  ECOG PERFORMANCE STATUS: 1 -  Symptomatic but completely ambulatory  Blood pressure 141/57, pulse 67, temperature 98.6 F (37 C), temperature source Oral, resp. rate 18, height _0  (1.651 m), weight 159 lb 12.8 oz (72.485 kg).  LABORATORY DATA: Lab Results  Component Value Date   WBC 6.5 01/05/2015   HGB 11.1* 01/05/2015   HCT 36.2 01/05/2015   MCV 81.5 01/05/2015   PLT 237 01/05/2015      Chemistry      Component Value Date/Time   NA 144 01/05/2015 0840   NA 140 10/01/2014 0655   NA 137 01/16/2012 0941   K 4.0 01/05/2015 0840   K 4.1 10/01/2014 0655   K 4.1 01/16/2012 0941   CL 104 10/01/2014 0655   CL 105 07/17/2012 1434   CL 100 01/16/2012 0941   CO2 29 01/05/2015 0840   CO2 24 10/01/2014 0655   CO2 28 01/16/2012 0941   BUN 22.2 01/05/2015 0840   BUN 30* 10/01/2014 0655   BUN 20 01/16/2012 0941   CREATININE 1.3* 01/05/2015 0840   CREATININE 1.36* 10/01/2014 0655   CREATININE 1.2 01/16/2012 0941      Component Value  Date/Time   CALCIUM 9.2 01/05/2015 0840   CALCIUM 9.6 10/01/2014 0655   CALCIUM 9.1 01/16/2012 0941   ALKPHOS 59 01/05/2015 0840   ALKPHOS 59 10/01/2014 0655   ALKPHOS 46 01/16/2012 0941   AST 18 01/05/2015 0840   AST 39* 10/01/2014 0655   AST 23 01/16/2012 0941   ALT 20 01/05/2015 0840   ALT 42* 10/01/2014 0655   ALT 21 01/16/2012 0941   BILITOT 0.25 01/05/2015 0840   BILITOT 0.6 10/01/2014 0655   BILITOT 0.60 01/16/2012 0941       RADIOGRAPHIC STUDIES: Nm Pet Image Restag (ps) Skull Base To Thigh  01/08/2015   ADDENDUM REPORT: 01/08/2015 14:07  ADDENDUM: Per discussion with Dr. Jacqulynn Cadet, percutaneous thermal ablation of the left upper lobe nodule was performed on 10/01/2014. Comparison is made to that exam as well as to 07/30/2014. The nodular lesion in the left upper lobe, measuring 2.4 x 3.2 cm, exhibits mild hypermetabolism with an SUV max of 3.3, likely related to the ablation procedure, rather than disease progression. This examination will serve as  baseline for future comparison.   Electronically Signed   By: Lorin Picket M.D.   On: 01/08/2015 14:07   01/08/2015   CLINICAL DATA:  Subsequent treatment strategy for lung cancer.  EXAM: NUCLEAR MEDICINE PET SKULL BASE TO THIGH  TECHNIQUE: 8.8 mCi F-18 FDG was injected intravenously. Full-ring PET imaging was performed from the skull base to thigh after the radiotracer. CT data was obtained and used for attenuation correction and anatomic localization.  FASTING BLOOD GLUCOSE:  Value: 94 Mg/dl  COMPARISON:  CT chest 07/30/2014 and PET 08/21/2013.  FINDINGS: NECK  No hypermetabolic lymph nodes in the neck. Thyroid is uniformly hypermetabolic, as before. CT images show no acute findings.  CHEST  No hypermetabolic mediastinal, hilar or axillary lymph nodes. Left upper lobe mass measures 2.4 x 3.2 cm and has an SUV max of 3.3. Adjacent volume loss and architectural distortion in the left upper lobe are presumably related to radiation therapy. Associated metabolism does not appear to be above blood pool. CT images show atherosclerotic calcification of the arterial vasculature, including coronary arteries. No pericardial or pleural effusion.  ABDOMEN/PELVIS  No abnormal hypermetabolism in the liver, adrenal glands, spleen or pancreas. CT images show a 9 mm low-attenuation lesion in the inferior left hepatic lobe, unchanged and likely a cyst. Liver, gallbladder and adrenal glands are otherwise unremarkable. Exophytic 2.1 cm mildly hyper attenuating lesion off the upper pole right kidney is stable and cannot be further characterized without post-contrast imaging. 9 mm low-attenuation lesion in the lower pole left kidney is too small to characterize. Spleen and pancreas are grossly unremarkable. Moderate hiatal hernia. Stomach and bowel are otherwise grossly unremarkable. 3.2 cm low-attenuation lesion in the right adnexa is unchanged from 08/21/2013. No free fluid.  SKELETON  No abnormal osseous hypermetabolism.   IMPRESSION: 1. Left upper lobe mass exhibits mild hypermetabolism, indicative of disease progression. 2. Coronary artery calcification.  Electronically Signed: By: Lorin Picket M.D. On: 01/05/2015 11:18   ASSESSMENT AND PLAN: This is a very pleasant 74 years old white female with recurrent non-small cell lung cancer, adenocarcinoma with conflicting reports regarding the EGFR mutation including the recent one from the biopsy of the left upper lobe lung nodule that showed mutation at exon 18. She recently completed a course of stereotactic radiotherapy to the left prevascular lymphadenopathy under the care of Dr. Pablo Ledger. She was recently treated with a course of Tarceva initially  at 150 mg by mouth daily for one month followed by 1 week treatment at a reduced dose of 100 mg by mouth daily but unfortunately the patient was unable to tolerate her treatment and this was discontinued. Restaging CT scan of the chest showed evidence for mild enlargement of the left upper lobe lung nodule with stable disease in other areas. She was not a candidate for repeat stereotactic radiotherapy to this lesion.  She underwent thermal ablation of the left upper lobe nodule by interventional radiology and tolerated the procedure well. Repeat PET scan showed mild hypermetabolism within the treated left upper lobe lesion likely secondary to the ablation procedure rather than disease progression. I discussed the scan results with the patient and her husband today. I recommended for her to continue on observation with repeat PET scan in 6 months. She was advised to call immediately if she has any concerning symptoms in the interval. All questions were answered. The patient knows to call the clinic with any problems, questions or concerns. We can certainly see the patient much sooner if necessary.  Disclaimer: This note was dictated with voice recognition software. Similar sounding words can inadvertently be transcribed and  may not be corrected upon review.

## 2015-02-04 DIAGNOSIS — M76829 Posterior tibial tendinitis, unspecified leg: Secondary | ICD-10-CM | POA: Diagnosis not present

## 2015-02-04 DIAGNOSIS — M545 Low back pain: Secondary | ICD-10-CM | POA: Diagnosis not present

## 2015-02-04 DIAGNOSIS — M722 Plantar fascial fibromatosis: Secondary | ICD-10-CM | POA: Diagnosis not present

## 2015-02-04 DIAGNOSIS — M5126 Other intervertebral disc displacement, lumbar region: Secondary | ICD-10-CM | POA: Diagnosis not present

## 2015-02-09 DIAGNOSIS — Z1231 Encounter for screening mammogram for malignant neoplasm of breast: Secondary | ICD-10-CM | POA: Diagnosis not present

## 2015-03-26 ENCOUNTER — Other Ambulatory Visit: Payer: Self-pay | Admitting: Physician Assistant

## 2015-03-26 DIAGNOSIS — L570 Actinic keratosis: Secondary | ICD-10-CM | POA: Diagnosis not present

## 2015-03-26 DIAGNOSIS — M545 Low back pain: Secondary | ICD-10-CM | POA: Diagnosis not present

## 2015-03-26 DIAGNOSIS — Z08 Encounter for follow-up examination after completed treatment for malignant neoplasm: Secondary | ICD-10-CM | POA: Diagnosis not present

## 2015-03-26 DIAGNOSIS — M4806 Spinal stenosis, lumbar region: Secondary | ICD-10-CM | POA: Diagnosis not present

## 2015-03-26 DIAGNOSIS — C44319 Basal cell carcinoma of skin of other parts of face: Secondary | ICD-10-CM | POA: Diagnosis not present

## 2015-03-26 DIAGNOSIS — Z85820 Personal history of malignant melanoma of skin: Secondary | ICD-10-CM | POA: Diagnosis not present

## 2015-03-26 DIAGNOSIS — Z85828 Personal history of other malignant neoplasm of skin: Secondary | ICD-10-CM | POA: Diagnosis not present

## 2015-03-26 DIAGNOSIS — M791 Myalgia: Secondary | ICD-10-CM | POA: Diagnosis not present

## 2015-03-26 DIAGNOSIS — C44519 Basal cell carcinoma of skin of other part of trunk: Secondary | ICD-10-CM | POA: Diagnosis not present

## 2015-04-22 ENCOUNTER — Ambulatory Visit: Payer: Medicare Other | Admitting: Internal Medicine

## 2015-04-26 DIAGNOSIS — Z23 Encounter for immunization: Secondary | ICD-10-CM | POA: Diagnosis not present

## 2015-04-28 ENCOUNTER — Encounter: Payer: Self-pay | Admitting: Medical Oncology

## 2015-05-07 DIAGNOSIS — C44519 Basal cell carcinoma of skin of other part of trunk: Secondary | ICD-10-CM | POA: Diagnosis not present

## 2015-05-07 DIAGNOSIS — C44119 Basal cell carcinoma of skin of left eyelid, including canthus: Secondary | ICD-10-CM | POA: Diagnosis not present

## 2015-05-07 DIAGNOSIS — D485 Neoplasm of uncertain behavior of skin: Secondary | ICD-10-CM | POA: Diagnosis not present

## 2015-05-07 DIAGNOSIS — C44319 Basal cell carcinoma of skin of other parts of face: Secondary | ICD-10-CM | POA: Diagnosis not present

## 2015-05-11 ENCOUNTER — Other Ambulatory Visit: Payer: Self-pay | Admitting: Cardiology

## 2015-05-21 DIAGNOSIS — M791 Myalgia: Secondary | ICD-10-CM | POA: Diagnosis not present

## 2015-05-21 DIAGNOSIS — M4726 Other spondylosis with radiculopathy, lumbar region: Secondary | ICD-10-CM | POA: Diagnosis not present

## 2015-05-21 DIAGNOSIS — M5126 Other intervertebral disc displacement, lumbar region: Secondary | ICD-10-CM | POA: Diagnosis not present

## 2015-05-26 ENCOUNTER — Other Ambulatory Visit: Payer: Self-pay | Admitting: Cardiology

## 2015-06-11 DIAGNOSIS — T8189XD Other complications of procedures, not elsewhere classified, subsequent encounter: Secondary | ICD-10-CM | POA: Diagnosis not present

## 2015-06-22 ENCOUNTER — Other Ambulatory Visit: Payer: Self-pay | Admitting: Cardiology

## 2015-07-14 ENCOUNTER — Encounter (HOSPITAL_COMMUNITY)
Admission: RE | Admit: 2015-07-14 | Discharge: 2015-07-14 | Disposition: A | Discharge status: Home / Self Care | Payer: Medicare Other | Source: Ambulatory Visit | Attending: Internal Medicine | Admitting: Internal Medicine

## 2015-07-14 ENCOUNTER — Other Ambulatory Visit: Payer: Self-pay | Admitting: Internal Medicine

## 2015-07-14 ENCOUNTER — Other Ambulatory Visit (HOSPITAL_BASED_OUTPATIENT_CLINIC_OR_DEPARTMENT_OTHER): Payer: Medicare Other

## 2015-07-14 DIAGNOSIS — C3412 Malignant neoplasm of upper lobe, left bronchus or lung: Secondary | ICD-10-CM | POA: Insufficient documentation

## 2015-07-14 DIAGNOSIS — C439 Malignant melanoma of skin, unspecified: Secondary | ICD-10-CM | POA: Insufficient documentation

## 2015-07-14 DIAGNOSIS — R918 Other nonspecific abnormal finding of lung field: Secondary | ICD-10-CM | POA: Diagnosis not present

## 2015-07-14 LAB — COMPREHENSIVE METABOLIC PANEL
ALBUMIN: 3.4 g/dL — AB (ref 3.5–5.0)
ALK PHOS: 67 U/L (ref 40–150)
ALT: 17 U/L (ref 0–55)
AST: 19 U/L (ref 5–34)
Anion Gap: 7 mEq/L (ref 3–11)
BILIRUBIN TOTAL: 0.4 mg/dL (ref 0.20–1.20)
BUN: 21.4 mg/dL (ref 7.0–26.0)
CO2: 31 mEq/L — ABNORMAL HIGH (ref 22–29)
Calcium: 9.6 mg/dL (ref 8.4–10.4)
Chloride: 104 mEq/L (ref 98–109)
Creatinine: 1.3 mg/dL — ABNORMAL HIGH (ref 0.6–1.1)
EGFR: 40 mL/min/{1.73_m2} — AB (ref >90)
GLUCOSE: 100 mg/dL (ref 70–140)
Potassium: 4.2 mEq/L (ref 3.5–5.1)
SODIUM: 143 meq/L (ref 136–145)
TOTAL PROTEIN: 6.1 g/dL — AB (ref 6.4–8.3)

## 2015-07-14 LAB — CBC WITH DIFFERENTIAL/PLATELET
BASO%: 1.1 % (ref 0.0–2.0)
BASOS ABS: 0.1 10*3/uL (ref 0.0–0.1)
EOS ABS: 1.1 10*3/uL — AB (ref 0.0–0.5)
EOS%: 16.7 % — ABNORMAL HIGH (ref 0.0–7.0)
HCT: 41 % (ref 34.8–46.6)
HEMOGLOBIN: 13.4 g/dL (ref 11.6–15.9)
LYMPH%: 19.9 % (ref 14.0–49.7)
MCH: 30.6 pg (ref 25.1–34.0)
MCHC: 32.6 g/dL (ref 31.5–36.0)
MCV: 94 fL (ref 79.5–101.0)
MONO#: 0.7 10*3/uL (ref 0.1–0.9)
MONO%: 10 % (ref 0.0–14.0)
NEUT%: 52.3 % (ref 38.4–76.8)
NEUTROS ABS: 3.5 10*3/uL (ref 1.5–6.5)
PLATELETS: 177 10*3/uL (ref 145–400)
RBC: 4.36 10*6/uL (ref 3.70–5.45)
RDW: 14.3 % (ref 11.2–14.5)
WBC: 6.7 10*3/uL (ref 3.9–10.3)
lymph#: 1.3 10*3/uL (ref 0.9–3.3)

## 2015-07-14 MED ORDER — FLUDEOXYGLUCOSE F - 18 (FDG) INJECTION
7.8300 | Freq: Once | INTRAVENOUS | Status: AC | PRN
  Administered 2015-07-14: 7.83 via INTRAVENOUS

## 2015-07-15 LAB — GLUCOSE, CAPILLARY: GLUCOSE-CAPILLARY: 96 mg/dL (ref 65–99)

## 2015-07-16 DIAGNOSIS — C44119 Basal cell carcinoma of skin of left eyelid, including canthus: Secondary | ICD-10-CM | POA: Diagnosis not present

## 2015-07-16 DIAGNOSIS — C44311 Basal cell carcinoma of skin of nose: Secondary | ICD-10-CM | POA: Diagnosis not present

## 2015-07-16 LAB — GLUCOSE, CAPILLARY: GLUCOSE-CAPILLARY: 96 mg/dL (ref 65–99)

## 2015-07-21 ENCOUNTER — Ambulatory Visit (HOSPITAL_BASED_OUTPATIENT_CLINIC_OR_DEPARTMENT_OTHER): Payer: Medicare Other | Admitting: Internal Medicine

## 2015-07-21 ENCOUNTER — Encounter: Payer: Self-pay | Admitting: Internal Medicine

## 2015-07-21 VITALS — BP 161/58 | HR 72 | Temp 98.1°F | Resp 17 | Ht 65.0 in | Wt 164.0 lb

## 2015-07-21 DIAGNOSIS — C4330 Malignant melanoma of unspecified part of face: Secondary | ICD-10-CM | POA: Diagnosis not present

## 2015-07-21 DIAGNOSIS — C3492 Malignant neoplasm of unspecified part of left bronchus or lung: Secondary | ICD-10-CM

## 2015-07-21 DIAGNOSIS — C3412 Malignant neoplasm of upper lobe, left bronchus or lung: Secondary | ICD-10-CM | POA: Diagnosis not present

## 2015-07-21 NOTE — Progress Notes (Signed)
[place] Telephone:(336) [phone]   Fax:(336) [phone]  OFFICE PROGRESS NOTE  No PCP Per Patient No address on file  PRINCIPAL DIAGNOSES:  1. 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 The patient presented at that time with a right upper lobe lesion, as well as suspicious ground-glass opacities in the left lung. The tumor was negative for EGFR mutation and negative for ALK gene translocation. Veristrat test Good. EGFR mutation studies were conflicting, initial test was negative, second test was positive for EGFR mutation in exon 18 and the the test from [facility] one was negative with positive KRAS mutation in Exon 12. 2. Stage IA malignant melanoma, status post wide excision by Dr. Allyson Sabal on Nov 12, 2008.  PRIOR THERAPY:  1) Status post right upper lobectomy with lymph node dissection under the care of Dr. Arlyce Dice on May 21, 2008.  2) Stereotactic radiotherapy to the left upper lobe lung nodule under the care of Dr. Pablo Ledger expected to be completed on 10/21/2011.  3) stereotactic radiotherapy to the left prevascular lymphadenopathy under the care of Dr. Pablo Ledger completed on 09/26/2013. 4) Tarceva 150 mg by mouth daily started 11/29/2013 discontinued today secondary to intolerance with persistent diarrhea and fatigue. 5) Tarceva 100 mg by mouth daily started on 12/27/2013. Discontinued one week after treatment because of intolerance. 6) status post thermal ablation of the left upper lobe recurrent adenocarcinoma under the care of Dr. Laurence Ferrari on 10/01/2014  CURRENT THERAPY: Observation.  Advanced directives: The patient has advanced directive and no change is requested.  INTERVAL HISTORY: [name] 75 y.o. female returns to the clinic today for follow up visit accompanied by her husband. The patient has no complaints today except for  fatigue. She recently underwent a few surgical excision of skin cancers from the face and she is expected to have another surgery on the left eyelid with eye closure for 6 weeks in the next few weeks. She denied having any significant weight loss or night sweats. The patient denied having any fever or chills. She has no nausea or vomiting. She denied having any significant chest pain, shortness of breath, cough or hemoptysis. She had repeat PET scan performed recently and she is here for evaluation and discussion of her scan results.  MEDICAL HISTORY: Past Medical History  Diagnosis Date  . Hypertension   . Tremor     hand- intention tremor  . lung ca dx'd 07/2008    lung- right non small cell, adenocarcinoma w/ broncho- alveolar features  . Melanoma ([place])     left leg  . Cervical ca ([place]) dx'd 1988    surg only  . Radiation 10/11/11-10/21/11    Left upper lobe adenocarcinoma  . Hx of radiation therapy 09/16/13-09/30/13    prevascular lymph node  . LBBB (left bundle branch block)   . Complication of anesthesia     difficulty awakening, dysrhythmia post surgery, (prolonged sedation level)  . Lung cancer ([place])     Bilaterally, RUL lobectomy and spot involving both lungs- "cancer"  . Headache     occ. sinus type headaches  . Arthritis     back.  . Macular degeneration     bilateral  . Hyperthyroidism     dr Loanne Drilling- radioactive iodine treatment 2011- resolved    ALLERGIES:  is allergic to meperidine hcl; penicillins; morphine; oxycodone-acetaminophen; propoxyphene n-acetaminophen; and tramadol hcl.  MEDICATIONS:  Current  Outpatient Prescriptions  Medication Sig Dispense Refill  . acetaminophen (TYLENOL) 650 MG CR tablet Take 650 mg by mouth every 8 (eight) hours as needed for pain.    . Aspirin-Salicylamide-Caffeine (BC HEADACHE POWDER PO) Take 1 Package by mouth daily as needed.    Marland Kitchen atenolol (TENORMIN) 25 MG tablet Take 1 tablet (25 mg total) by mouth 2 (two) times daily. 180 tablet 3   . calcium-vitamin D (OSCAL WITH D) 500-200 MG-UNIT per tablet Take 1 tablet by mouth every morning.     Marland Kitchen DILT-XR 240 MG 24 hr capsule TAKE 1 CAPSULE BY MOUTH ONCE DAILY 90 capsule 0  . fluticasone (FLONASE) 50 MCG/ACT nasal spray Place 1 spray into both nostrils daily as needed for allergies or rhinitis.    Marland Kitchen gabapentin (NEURONTIN) 300 MG capsule Take 300 mg by mouth at bedtime. States she takes 2 hs  2  . hydrochlorothiazide (HYDRODIURIL) 25 MG tablet TAKE 1 TABLET BY MOUTH EVERY MORNING 14 tablet 0  . Multiple Vitamin (MULITIVITAMIN WITH MINERALS) TABS Take 1 tablet by mouth every morning.     . neomycin-polymyxin b-dexamethasone (MAXITROL) 3.5-10000-0.1 OINT   1  . tiZANidine (ZANAFLEX) 4 MG tablet TK 1/2 TO 1  T PO Q 8 HOURS PRN. NOT TO EXCEED 3 TABLETS IN 24 HOURS.  1   No current facility-administered medications for this visit.    SURGICAL HISTORY:  Past Surgical History  Procedure Laterality Date  . Tonsillectomy    . Conization for cervical dysplasia    . Rul lobectomy  2010  . Esophogus stretching    . Cataract extraction, bilateral Bilateral     REVIEW OF SYSTEMS:  A comprehensive review of systems was negative except for: Constitutional: positive for fatigue Musculoskeletal: positive for back pain   PHYSICAL EXAMINATION: General appearance: alert, cooperative and no distress Head: Normocephalic, without obvious abnormality, atraumatic, Surgical scar on the right side of the upper nose and below the eye secondary to excision of basal cell carcinoma Neck: no adenopathy Lymph nodes: Cervical, supraclavicular, and axillary nodes normal. Resp: clear to auscultation bilaterally Cardio: regular rate and rhythm, S1, S2 normal, no murmur, click, rub or gallop GI: soft, non-tender; bowel sounds normal; no masses,  no organomegaly Extremities: extremities normal, atraumatic, no cyanosis or edema Neurologic: Alert and oriented X 3, normal strength and tone. Normal symmetric  reflexes. Normal coordination and gait  ECOG PERFORMANCE STATUS: 1 - Symptomatic but completely ambulatory  Blood pressure 161/58, pulse 72, temperature 98.1 F (36.7 C), temperature source Oral, resp. rate 17, height '5\' 5"'  (1.651 m), weight 164 lb (74.39 kg), SpO2 96 %.  LABORATORY DATA: Lab Results  Component Value Date   WBC 6.7 07/14/2015   HGB 13.4 07/14/2015   HCT 41.0 07/14/2015   MCV 94.0 07/14/2015   PLT 177 07/14/2015      Chemistry      Component Value Date/Time   NA 143 07/14/2015 1000   NA 140 10/01/2014 0655   NA 137 01/16/2012 0941   K 4.2 07/14/2015 1000   K 4.1 10/01/2014 0655   K 4.1 01/16/2012 0941   CL 104 10/01/2014 0655   CL 105 07/17/2012 1434   CL 100 01/16/2012 0941   CO2 31* 07/14/2015 1000   CO2 24 10/01/2014 0655   CO2 28 01/16/2012 0941   BUN 21.4 07/14/2015 1000   BUN 30* 10/01/2014 0655   BUN 20 01/16/2012 0941   CREATININE 1.3* 07/14/2015 1000   CREATININE 1.36* 10/01/2014 5697  CREATININE 1.2 01/16/2012 0941      Component Value Date/Time   CALCIUM 9.6 07/14/2015 1000   CALCIUM 9.6 10/01/2014 0655   CALCIUM 9.1 01/16/2012 0941   ALKPHOS 67 07/14/2015 1000   ALKPHOS 59 10/01/2014 0655   ALKPHOS 46 01/16/2012 0941   AST 19 07/14/2015 1000   AST 39* 10/01/2014 0655   AST 23 01/16/2012 0941   ALT 17 07/14/2015 1000   ALT 42* 10/01/2014 0655   ALT 21 01/16/2012 0941   BILITOT 0.40 07/14/2015 1000   BILITOT 0.6 10/01/2014 0655   BILITOT 0.60 01/16/2012 0941       RADIOGRAPHIC STUDIES: Nm Pet Image Restag (ps) Skull Base To Thigh  07/14/2015  CLINICAL DATA:  Subsequent treatment strategy for restaging of left sided lung cancer. Left upper lobe primary. History of melanoma. Status post thermal ablation of left upper lobe lung nodule on 10/01/2014. EXAM: NUCLEAR MEDICINE PET SKULL BASE TO THIGH TECHNIQUE: 7.8 MCi F-18 FDG was injected intravenously. Full-ring PET imaging was performed from the skull base to thigh after the  radiotracer. CT data was obtained and used for attenuation correction and anatomic localization. FASTING BLOOD GLUCOSE:  Value: 96 mg/dl COMPARISON:  01/05/2015 FINDINGS: NECK No areas of abnormal hypermetabolism. CHEST Posterior left upper lobe lung mass measures on the order of 3.0 x 2.9 cm on image 15/series 6. remeasured at the same level on the prior, 3.0 x 2.5 cm. Appears increasingly masslike medially and superiorly. Patchy hypermetabolism is identified within. This measures a S.U.V. max of 4.7. On the prior exam, this measured a S.U.V. max of 3.3. No thoracic nodal hypermetabolism. ABDOMEN/PELVIS Left adrenal hypermetabolism which is new. This measures a S.U.V. max of 5.1. No well-defined adrenal nodule or mass. No other abnormal abdominal pelvic hypermetabolism. SKELETON No abnormal marrow activity. CT IMAGES PERFORMED FOR ATTENUATION CORRECTION Carotid atherosclerosis. No cervical adenopathy. Mild cardiomegaly with LAD coronary artery atherosclerosis. A moderate hiatal hernia. Similar inferior left upper lobe nodularity, including on image 24/ series 6. Favored to represent scarring. A 3 mm left lower lobe pulmonary nodule on image 35/series 6, present on the prior. Low-density interpolar right renal lesion is likely a cyst. A posterior interpolar right renal lesion is similar in size at 2.0 cm, greater than fluid density at 30 HU. Aortic and branch vessel atherosclerosis. Colonic stool burden suggests constipation. Right ovarian/adnexal fluid density lesion is similar at 3.2 cm. Osteopenia. IMPRESSION: 1. Increased hypermetabolism within a left upper lobe lung mass. Slightly more masslike today, measuring minimally larger. Combination of findings is suspicious for residual disease, status post thermal ablation. 2. No evidence of thoracic nodal metastasis. 3. Left adrenal hypermetabolism is moderate and new. Given absence of correlate adrenal mass, indeterminate. This warrants followup attention. 4.  Similar appearance of probable right renal complex cyst and right adnexal/ovarian simple appearing cystic lesion. Recommend attention on follow-up. 5. Moderate hiatal hernia with probable constipation. 6.  Atherosclerosis, including within the coronary arteries. Electronically Signed   By: Abigail Miyamoto M.D.   On: 07/14/2015 10:25   ASSESSMENT AND PLAN: This is a very pleasant 75 years old white female with recurrent non-small cell lung cancer, adenocarcinoma with conflicting reports regarding the EGFR mutation including the recent one from the biopsy of the left upper lobe lung nodule that showed mutation at exon 18. She recently completed a course of stereotactic radiotherapy to the left prevascular lymphadenopathy under the care of Dr. Pablo Ledger. She was recently treated with a course of Tarceva initially at 150  mg by mouth daily for one month followed by 1 week treatment at a reduced dose of 100 mg by mouth daily but unfortunately the patient was unable to tolerate her treatment and this was discontinued. Restaging CT scan of the chest showed evidence for mild enlargement of the left upper lobe lung nodule with stable disease in other areas. She was not a candidate for repeat stereotactic radiotherapy to this lesion.  She underwent thermal ablation of the left upper lobe nodule by interventional radiology and tolerated the procedure well. The recent PET scan showed further increase in the hypermetabolism within the treated left upper lobe lesion likely secondary to the ablation procedure but disease progression cannot be excluded at this time spatially with the presence of new small left adrenal hypermetabolic nodule. I discussed the scan results with the patient and her husband today and showed them the images.  I discussed with her again consideration of treatment with targeted therapy but the patient is not interested as she would be undergoing several surgical procedure in the next few months. She  would like to continue on observation for now. I will arrange for the patient to have repeat CT scan of the chest with contrast in 4 months for reevaluation of her disease but she was advised to call immediately if she has any concerning symptoms in the interval.  All questions were answered. The patient knows to call the clinic with any problems, questions or concerns. We can certainly see the patient much sooner if necessary.  Disclaimer: This note was dictated with voice recognition software. Similar sounding words can inadvertently be transcribed and may not be corrected upon review.

## 2015-07-23 DIAGNOSIS — M47897 Other spondylosis, lumbosacral region: Secondary | ICD-10-CM | POA: Diagnosis not present

## 2015-07-23 DIAGNOSIS — M791 Myalgia: Secondary | ICD-10-CM | POA: Diagnosis not present

## 2015-07-23 DIAGNOSIS — M545 Low back pain: Secondary | ICD-10-CM | POA: Diagnosis not present

## 2015-07-23 DIAGNOSIS — M5126 Other intervertebral disc displacement, lumbar region: Secondary | ICD-10-CM | POA: Diagnosis not present

## 2015-07-30 ENCOUNTER — Other Ambulatory Visit: Payer: Self-pay | Admitting: Cardiology

## 2015-08-19 ENCOUNTER — Encounter: Payer: Self-pay | Admitting: Cardiology

## 2015-08-19 ENCOUNTER — Ambulatory Visit (INDEPENDENT_AMBULATORY_CARE_PROVIDER_SITE_OTHER): Payer: Medicare Other | Admitting: Cardiology

## 2015-08-19 VITALS — BP 152/68 | HR 68 | Ht 65.0 in | Wt 162.0 lb

## 2015-08-19 DIAGNOSIS — I2510 Atherosclerotic heart disease of native coronary artery without angina pectoris: Secondary | ICD-10-CM | POA: Diagnosis not present

## 2015-08-19 DIAGNOSIS — I739 Peripheral vascular disease, unspecified: Principal | ICD-10-CM

## 2015-08-19 DIAGNOSIS — E785 Hyperlipidemia, unspecified: Secondary | ICD-10-CM

## 2015-08-19 DIAGNOSIS — I2583 Coronary atherosclerosis due to lipid rich plaque: Secondary | ICD-10-CM

## 2015-08-19 DIAGNOSIS — I10 Essential (primary) hypertension: Secondary | ICD-10-CM

## 2015-08-19 DIAGNOSIS — I779 Disorder of arteries and arterioles, unspecified: Secondary | ICD-10-CM

## 2015-08-19 DIAGNOSIS — I447 Left bundle-branch block, unspecified: Secondary | ICD-10-CM

## 2015-08-19 LAB — LIPID PANEL
Cholesterol: 210 mg/dL — ABNORMAL HIGH (ref 125–200)
HDL: 46 mg/dL (ref >=46)
LDL Cholesterol: 130 mg/dL — ABNORMAL HIGH (ref <130)
Total CHOL/HDL Ratio: 4.6 Ratio (ref <=5.0)
Triglycerides: 170 mg/dL — ABNORMAL HIGH (ref <150)
VLDL: 34 mg/dL — ABNORMAL HIGH (ref <30)

## 2015-08-19 MED ORDER — DILTIAZEM HCL ER 240 MG PO CP24: 240.0000 mg | ORAL_CAPSULE | Freq: Every day | ORAL | Status: DC

## 2015-08-19 MED ORDER — HYDROCHLOROTHIAZIDE 25 MG PO TABS: 25.0000 mg | ORAL_TABLET | Freq: Every morning | ORAL | Status: DC

## 2015-08-19 MED ORDER — ATENOLOL 25 MG PO TABS: 25.0000 mg | ORAL_TABLET | Freq: Two times a day (BID) | ORAL | Status: DC

## 2015-08-19 NOTE — Progress Notes (Signed)
Patient ID: [name], female   DOB: 27-Nov-1940, 75 y.o.   MRN: [id number]       Cardiology Office Note  Date:  08/19/2015   ID:  [name], DOB 02-Feb-1941, MRN [id number]  PCP:  No PCP Per Patient  Cardiologist:  Dorothy Spark, MD   Chief complaint: One-year follow-up   History of Present Illness: [name] is a very pleasant 75 y.o. female who presents for one-year follow-up, previously followed by Dr. Ron Parker. ("This is a special patient of mine that I have followed many years. Over time we have shared family stories about our children. She is an intelligent, perceptive woman").  Patient is seen to follow-up hypertension and a history of left bundle branch block that has been diagnosed or 20 years ago, she underwent cardiac catheterization in the 80s that showed no significant coronary artery disease. She has a slow-growing lung cancer. Unfortunately it is continuing to grow and cause her difficulties.The most recent PET scan from last month shows worsening of lesion in her lungs and new and adrenal glands.  She is going to see her oncologist in May to see if she needs chemotherapy.  She has been through a lot of stress lately with her husband having kidney cancer and undergoing surgery.  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  She denies any chest pain, she has stable dyspnea on exertion, no paroxysmal nocturnal dyspnea orthopnea or lower extremity edema. She has been offered statins in the past but because of significant side effects that she usually experiences with medicines and she decided not to take it. Her carotid ultrasound in 2014 showed minimal plaque in 1-39% range bilaterally. Her chest CAT scan performed as part  of the follow-up scan for her cancer showed calcium in her left anterior descending artery.  Past Medical History  Diagnosis Date  . Hypertension   . Tremor     hand- intention tremor  . lung ca dx'd 07/2008    lung- right non small cell, adenocarcinoma w/ broncho- alveolar features  . Melanoma ([place])     left leg  . Cervical ca ([place]) dx'd 1988    surg only  . Radiation 10/11/11-10/21/11    Left upper lobe adenocarcinoma  . Hx of radiation therapy 09/16/13-09/30/13    prevascular lymph node  . LBBB (left bundle branch block)   . Complication of anesthesia     difficulty awakening, dysrhythmia post surgery, (prolonged sedation level)  . Lung cancer ([place])     Bilaterally, RUL lobectomy and spot involving both lungs- "cancer"  . Headache     occ. sinus type headaches  . Arthritis     back.  . Macular degeneration     bilateral  . Hyperthyroidism     dr Loanne Drilling- radioactive iodine treatment 2011- resolved    Past Surgical History  Procedure Laterality Date  . Tonsillectomy    . Conization for cervical dysplasia    . Rul lobectomy  2010  . Esophogus stretching    . Cataract extraction, bilateral Bilateral     Current Outpatient Prescriptions  Medication Sig Dispense Refill  . acetaminophen (TYLENOL) 650 MG CR tablet Take 650 mg by mouth every 8 (eight) hours as needed for  pain.    . Aspirin-Salicylamide-Caffeine (BC HEADACHE POWDER PO) Take 1 Package by mouth daily as needed.    Marland Kitchen atenolol (TENORMIN) 25 MG tablet Take 1 tablet (25 mg total) by mouth 2 (two) times daily. 180 tablet 11  . calcium-vitamin D (OSCAL WITH D) 500-200 MG-UNIT per tablet Take 1 tablet by mouth every morning.     . diltiazem (DILT-XR) 240 MG 24 hr capsule Take 1 capsule (240 mg total) by mouth daily. 90 capsule 11  . fluticasone (FLONASE) 50 MCG/ACT nasal spray Place 1 spray into both nostrils daily as needed for allergies or rhinitis.    Marland Kitchen gabapentin (NEURONTIN) 300 MG capsule Take 300 mg by mouth at  bedtime. States she takes 2 hs  2  . hydrochlorothiazide (HYDRODIURIL) 25 MG tablet Take 1 tablet (25 mg total) by mouth every morning. 90 tablet 11  . Multiple Vitamin (MULITIVITAMIN WITH MINERALS) TABS Take 1 tablet by mouth every morning.      No current facility-administered medications for this visit.    Allergies:   Meperidine hcl; Penicillins; Morphine; Oxycodone-acetaminophen; Propoxyphene n-acetaminophen; and Tramadol hcl    Social History:  The patient  reports that she quit smoking about 39 years ago. Her smoking use included Cigarettes. She has a 17 pack-year smoking history. She has never used smokeless tobacco. She reports that she drinks alcohol. She reports that she does not use illicit drugs.   Family History:  The patient's family history includes Asthma in her mother; Cancer in her maternal grandfather and maternal uncle; Heart disease in her mother; Thyroid disease in her daughter. There is no history of Diabetes or Coronary artery disease.   ROS:  Please see the history of present illness.   Otherwise, review of systems are positive for none.   All other systems are reviewed and negative.   PHYSICAL EXAM: VS:  BP 152/68 mmHg  Pulse 68  Ht '5\' 5"'$  (1.651 m)  Wt 162 lb (73.483 kg)  BMI 26.96 kg/m2 , BMI Body mass index is 26.96 kg/(m^2). GEN: Well nourished, well developed, in no acute distress HEENT: normal Neck: no JVD, carotid bruits, or masses Cardiac: RRR; no murmurs, rubs, or gallops,no edema  Respiratory:  clear to auscultation bilaterally, normal work of breathing GI: soft, nontender, nondistended, + BS MS: no deformity or atrophy Skin: warm and dry, no rash Neuro:  Strength and sensation are intact Psych: euthymic mood, full affect  EKG:  EKG is ordered today. The ekg ordered today demonstrates SR, LBBB  Recent Labs: 07/14/2015: ALT 17; BUN 21.4; Creatinine 1.3*; HGB 13.4; Platelets 177; Potassium 4.2; Sodium 143   Lipid Panel    Component Value  Date/Time   CHOL 211* 05/17/2013 0937   TRIG 99.0 05/17/2013 0937   HDL 59.10 05/17/2013 0937   CHOLHDL 4 05/17/2013 0937   VLDL 19.8 05/17/2013 0937   LDLDIRECT 137.6 05/17/2013 0937   Wt Readings from Last 3 Encounters:  08/19/15 162 lb (73.483 kg)  07/21/15 164 lb (74.39 kg)  01/13/15 159 lb 12.8 oz (72.485 kg)      ASSESSMENT AND PLAN:  1. CAD - nonobstructive in the past, now evidence of calcium or her LAD, she is asymptomatic, no ischemic workup necessary at this time. However I suggested starting statin therapy, she is open to it, for now we will start just red yeast rice 600 mg over-the-counter orally daily. If her cholesterol turns out to be significantly elevated, will consider starting pravastatin as that has the least  amount of side effects.  2. Hypertension - most probably white coat hypertension today, she states that at home it's always in the range 135-140 and that's appropriate for her age. I would just continue the same medicine for now.  3. Hyperlipidemia - as above  4. Carotid disease - minimal plaque ultrasound in 2014 with no new symptoms no need to repeat at this point.  5. Left bundle branch block - chronic and unchanged.  Labs/ tests ordered today include:  Orders Placed This Encounter  Procedures  . Lipid Profile  . EKG 12-Lead    Follow-up in one year  Signed, Dorothy Spark, MD  08/19/2015 1:25 PM    [facility] [place], [place], [place]  [address] Phone: [phone]; Fax: [phone]

## 2015-08-19 NOTE — Patient Instructions (Signed)
Medication Instructions:   Your physician recommends that you continue on your current medications as directed. Please refer to the Current Medication list given to you today.   Labwork:  TODAY---LIPIDS    Follow-Up:  Your physician wants you to follow-up in: [place] will receive a reminder letter in the mail two months in advance. If you don't receive a letter, please call our office to schedule the follow-up appointment.      If you need a refill on your cardiac medications before your next appointment, please call your pharmacy.

## 2015-08-20 ENCOUNTER — Telehealth: Payer: Self-pay | Admitting: *Deleted

## 2015-08-20 DIAGNOSIS — H02831 Dermatochalasis of right upper eyelid: Secondary | ICD-10-CM | POA: Diagnosis not present

## 2015-08-20 DIAGNOSIS — C44119 Basal cell carcinoma of skin of left eyelid, including canthus: Secondary | ICD-10-CM | POA: Diagnosis not present

## 2015-08-20 DIAGNOSIS — H02834 Dermatochalasis of left upper eyelid: Secondary | ICD-10-CM | POA: Diagnosis not present

## 2015-08-20 DIAGNOSIS — H04123 Dry eye syndrome of bilateral lacrimal glands: Secondary | ICD-10-CM | POA: Diagnosis not present

## 2015-08-20 DIAGNOSIS — H0289 Other specified disorders of eyelid: Secondary | ICD-10-CM | POA: Diagnosis not present

## 2015-08-20 MED ORDER — RED YEAST RICE 600 MG PO CAPS: 600.0000 mg | ORAL_CAPSULE | Freq: Every day | ORAL | Status: DC

## 2015-08-20 NOTE — Telephone Encounter (Signed)
-----   Message from Dorothy Spark, MD sent at 08/20/2015  9:40 AM EST ----- Her LDL and triglycerides are mildly elevated, I would start with red yeast rice 600 mg po daily and diet modifications.

## 2015-08-20 NOTE — Telephone Encounter (Signed)
Notified the pt that per Dr Meda Coffee, here lipids showed that her LDL and triglycerides are mildly elevated, and she recommends that the pt start taking red yeast rice 600 mg po daily and diet modifications.  Informed the pt that I will call this med into her pharmacy of choice, but this will be filled OTC.  Provided pt education on diet modifications for her elevated LDL and triglycerides.  Pt verbalized understanding and agrees with this plan.

## 2015-08-31 DIAGNOSIS — M47897 Other spondylosis, lumbosacral region: Secondary | ICD-10-CM | POA: Diagnosis not present

## 2015-08-31 DIAGNOSIS — M7062 Trochanteric bursitis, left hip: Secondary | ICD-10-CM | POA: Diagnosis not present

## 2015-08-31 DIAGNOSIS — M5126 Other intervertebral disc displacement, lumbar region: Secondary | ICD-10-CM | POA: Diagnosis not present

## 2015-08-31 DIAGNOSIS — M545 Low back pain: Secondary | ICD-10-CM | POA: Diagnosis not present

## 2015-09-23 DIAGNOSIS — M5126 Other intervertebral disc displacement, lumbar region: Secondary | ICD-10-CM | POA: Diagnosis not present

## 2015-10-15 DIAGNOSIS — C44119 Basal cell carcinoma of skin of left eyelid, including canthus: Secondary | ICD-10-CM | POA: Diagnosis not present

## 2015-10-15 DIAGNOSIS — H0289 Other specified disorders of eyelid: Secondary | ICD-10-CM | POA: Diagnosis not present

## 2015-10-16 DIAGNOSIS — I10 Essential (primary) hypertension: Secondary | ICD-10-CM | POA: Diagnosis not present

## 2015-10-16 DIAGNOSIS — H04123 Dry eye syndrome of bilateral lacrimal glands: Secondary | ICD-10-CM | POA: Diagnosis not present

## 2015-10-16 DIAGNOSIS — Z87891 Personal history of nicotine dependence: Secondary | ICD-10-CM | POA: Diagnosis not present

## 2015-10-16 DIAGNOSIS — K449 Diaphragmatic hernia without obstruction or gangrene: Secondary | ICD-10-CM | POA: Diagnosis not present

## 2015-10-16 DIAGNOSIS — H3530 Unspecified macular degeneration: Secondary | ICD-10-CM | POA: Diagnosis not present

## 2015-10-16 DIAGNOSIS — Z85828 Personal history of other malignant neoplasm of skin: Secondary | ICD-10-CM | POA: Diagnosis not present

## 2015-10-16 DIAGNOSIS — H02834 Dermatochalasis of left upper eyelid: Secondary | ICD-10-CM | POA: Diagnosis not present

## 2015-10-16 DIAGNOSIS — H02831 Dermatochalasis of right upper eyelid: Secondary | ICD-10-CM | POA: Diagnosis not present

## 2015-10-16 DIAGNOSIS — K219 Gastro-esophageal reflux disease without esophagitis: Secondary | ICD-10-CM | POA: Diagnosis not present

## 2015-10-16 DIAGNOSIS — M1990 Unspecified osteoarthritis, unspecified site: Secondary | ICD-10-CM | POA: Diagnosis not present

## 2015-10-16 DIAGNOSIS — Z428 Encounter for other plastic and reconstructive surgery following medical procedure or healed injury: Secondary | ICD-10-CM | POA: Diagnosis not present

## 2015-10-16 DIAGNOSIS — C44119 Basal cell carcinoma of skin of left eyelid, including canthus: Secondary | ICD-10-CM | POA: Diagnosis not present

## 2015-10-26 DIAGNOSIS — M5126 Other intervertebral disc displacement, lumbar region: Secondary | ICD-10-CM | POA: Diagnosis not present

## 2015-10-26 DIAGNOSIS — M47816 Spondylosis without myelopathy or radiculopathy, lumbar region: Secondary | ICD-10-CM | POA: Diagnosis not present

## 2015-10-26 DIAGNOSIS — M4806 Spinal stenosis, lumbar region: Secondary | ICD-10-CM | POA: Diagnosis not present

## 2015-10-26 DIAGNOSIS — M791 Myalgia: Secondary | ICD-10-CM | POA: Diagnosis not present

## 2015-11-03 ENCOUNTER — Telehealth: Payer: Self-pay | Admitting: Internal Medicine

## 2015-11-03 NOTE — Telephone Encounter (Signed)
returned call and s.w pt and r/s MD visit....pt ok and aware of new d.t

## 2015-11-10 ENCOUNTER — Encounter (HOSPITAL_COMMUNITY): Payer: Self-pay

## 2015-11-10 ENCOUNTER — Ambulatory Visit (HOSPITAL_COMMUNITY)
Admission: RE | Admit: 2015-11-10 | Discharge: 2015-11-10 | Disposition: A | Discharge status: Home / Self Care | Payer: Medicare Other | Source: Ambulatory Visit | Attending: Internal Medicine | Admitting: Internal Medicine

## 2015-11-10 ENCOUNTER — Other Ambulatory Visit (HOSPITAL_BASED_OUTPATIENT_CLINIC_OR_DEPARTMENT_OTHER): Payer: Medicare Other

## 2015-11-10 DIAGNOSIS — C3492 Malignant neoplasm of unspecified part of left bronchus or lung: Secondary | ICD-10-CM

## 2015-11-10 DIAGNOSIS — C3491 Malignant neoplasm of unspecified part of right bronchus or lung: Secondary | ICD-10-CM | POA: Diagnosis present

## 2015-11-10 DIAGNOSIS — C4330 Malignant melanoma of unspecified part of face: Secondary | ICD-10-CM

## 2015-11-10 DIAGNOSIS — C3490 Malignant neoplasm of unspecified part of unspecified bronchus or lung: Secondary | ICD-10-CM | POA: Diagnosis not present

## 2015-11-10 DIAGNOSIS — C3412 Malignant neoplasm of upper lobe, left bronchus or lung: Secondary | ICD-10-CM | POA: Insufficient documentation

## 2015-11-10 DIAGNOSIS — R918 Other nonspecific abnormal finding of lung field: Secondary | ICD-10-CM | POA: Diagnosis not present

## 2015-11-10 LAB — CBC WITH DIFFERENTIAL/PLATELET
BASO%: 0.2 % (ref 0.0–2.0)
Basophils Absolute: 0 10*3/uL (ref 0.0–0.1)
EOS%: 3.8 % (ref 0.0–7.0)
Eosinophils Absolute: 0.4 10*3/uL (ref 0.0–0.5)
HEMATOCRIT: 40.4 % (ref 34.8–46.6)
HEMOGLOBIN: 13.2 g/dL (ref 11.6–15.9)
LYMPH#: 1.8 10*3/uL (ref 0.9–3.3)
LYMPH%: 19 % (ref 14.0–49.7)
MCH: 31.2 pg (ref 25.1–34.0)
MCHC: 32.7 g/dL (ref 31.5–36.0)
MCV: 95.5 fL (ref 79.5–101.0)
MONO#: 1.4 10*3/uL — AB (ref 0.1–0.9)
MONO%: 14.7 % — ABNORMAL HIGH (ref 0.0–14.0)
NEUT%: 62.3 % (ref 38.4–76.8)
NEUTROS ABS: 6 10*3/uL (ref 1.5–6.5)
Platelets: 211 10*3/uL (ref 145–400)
RBC: 4.23 10*6/uL (ref 3.70–5.45)
RDW: 14.6 % — AB (ref 11.2–14.5)
WBC: 9.6 10*3/uL (ref 3.9–10.3)

## 2015-11-10 LAB — COMPREHENSIVE METABOLIC PANEL
ALBUMIN: 3.5 g/dL (ref 3.5–5.0)
ALT: 20 U/L (ref 0–55)
AST: 15 U/L (ref 5–34)
Alkaline Phosphatase: 56 U/L (ref 40–150)
Anion Gap: 6 mEq/L (ref 3–11)
BILIRUBIN TOTAL: 0.67 mg/dL (ref 0.20–1.20)
BUN: 23.5 mg/dL (ref 7.0–26.0)
CALCIUM: 9.8 mg/dL (ref 8.4–10.4)
CHLORIDE: 106 meq/L (ref 98–109)
CO2: 30 mEq/L — ABNORMAL HIGH (ref 22–29)
CREATININE: 1.3 mg/dL — AB (ref 0.6–1.1)
EGFR: 41 mL/min/{1.73_m2} — ABNORMAL LOW (ref >90)
Glucose: 92 mg/dl (ref 70–140)
Potassium: 4 mEq/L (ref 3.5–5.1)
Sodium: 142 mEq/L (ref 136–145)
TOTAL PROTEIN: 6.1 g/dL — AB (ref 6.4–8.3)

## 2015-11-10 MED ORDER — IOPAMIDOL (ISOVUE-300) INJECTION 61%
75.0000 mL | Freq: Once | INTRAVENOUS | Status: AC | PRN
  Administered 2015-11-10: 60 mL via INTRAVENOUS

## 2015-11-18 ENCOUNTER — Ambulatory Visit: Payer: Medicare Other | Admitting: Internal Medicine

## 2015-11-23 ENCOUNTER — Ambulatory Visit (HOSPITAL_BASED_OUTPATIENT_CLINIC_OR_DEPARTMENT_OTHER): Payer: Medicare Other | Admitting: Internal Medicine

## 2015-11-23 ENCOUNTER — Encounter: Payer: Self-pay | Admitting: Internal Medicine

## 2015-11-23 ENCOUNTER — Telehealth: Payer: Self-pay | Admitting: Internal Medicine

## 2015-11-23 VITALS — BP 152/48 | HR 61 | Temp 98.1°F | Resp 18 | Ht 65.0 in | Wt 159.1 lb

## 2015-11-23 DIAGNOSIS — M7062 Trochanteric bursitis, left hip: Secondary | ICD-10-CM | POA: Diagnosis not present

## 2015-11-23 DIAGNOSIS — C3412 Malignant neoplasm of upper lobe, left bronchus or lung: Secondary | ICD-10-CM | POA: Diagnosis not present

## 2015-11-23 DIAGNOSIS — M5126 Other intervertebral disc displacement, lumbar region: Secondary | ICD-10-CM | POA: Diagnosis not present

## 2015-11-23 DIAGNOSIS — M47816 Spondylosis without myelopathy or radiculopathy, lumbar region: Secondary | ICD-10-CM | POA: Diagnosis not present

## 2015-11-23 DIAGNOSIS — M545 Low back pain: Secondary | ICD-10-CM | POA: Diagnosis not present

## 2015-11-23 NOTE — Progress Notes (Signed)
[facility] [phone]   [phone]  OFFICE PROGRESS NOTE  No PCP Per Patient No address on file  PRINCIPAL DIAGNOSES:  1. 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 The patient presented at that time with a right upper lobe lesion, as well as suspicious ground-glass opacities in the left lung. The tumor was negative for EGFR mutation and negative for ALK gene translocation. Veristrat test Good. 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 2. Stage IA malignant melanoma, status post wide excision by Dr. Allyson Sabal on Nov 12, 2008.  PRIOR THERAPY:  1) Status post right upper lobectomy with lymph node dissection under the care of Dr. Arlyce Dice on May 21, 2008.  2) Stereotactic radiotherapy to the left upper lobe lung nodule under the care of Dr. Pablo Ledger expected to be completed on 10/21/2011.  3) stereotactic radiotherapy to the left prevascular lymphadenopathy under the care of Dr. Pablo Ledger completed on 09/26/2013. 4) Tarceva 150 mg by mouth daily started 11/29/2013 discontinued today secondary to intolerance with persistent diarrhea and fatigue. 5) Tarceva 100 mg by mouth daily started on 12/27/2013. Discontinued one week after treatment because of intolerance. 6) status post thermal ablation of the left upper lobe recurrent adenocarcinoma under the care of Dr. Laurence Ferrari on 10/01/2014  CURRENT THERAPY: Observation.  Advanced directives: The patient has advanced directive and no change is requested.  INTERVAL HISTORY: [name] 75 y.o. female returns to the clinic today for follow up visit accompanied by her husband. The patient has no complaints today except for  fatigue. She had recent excision and skin graft in the left eye performed recently. She denied having any significant weight loss or night sweats. The patient denied having any fever or chills. She has no nausea or vomiting. She denied having any significant chest pain, shortness of breath, cough or hemoptysis. She had repeat CT scan of the chest and she is here today for evaluation and discussion of her scan results.  MEDICAL HISTORY: Past Medical History  Diagnosis Date  . Hypertension   . Tremor     hand- intention tremor  . lung ca dx'd 07/2008    lung- right non small cell, adenocarcinoma w/ broncho- alveolar features  . Melanoma ([place])     left leg  . Cervical ca ([place]) dx'd 1988    surg only  . Radiation 10/11/11-10/21/11    Left upper lobe adenocarcinoma  . Hx of radiation therapy 09/16/13-09/30/13    prevascular lymph node  . LBBB (left bundle branch block)   . Complication of anesthesia     difficulty awakening, dysrhythmia post surgery, (prolonged sedation level)  . Lung cancer ([place])     Bilaterally, RUL lobectomy and spot involving both lungs- "cancer"  . Headache     occ. sinus type headaches  . Arthritis     back.  . Macular degeneration     bilateral  . Hyperthyroidism     dr Loanne Drilling- radioactive iodine treatment 2011- resolved    ALLERGIES:  is allergic to meperidine hcl; penicillins; morphine; oxycodone-acetaminophen; propoxyphene n-acetaminophen; and tramadol hcl.  MEDICATIONS:  Current Outpatient Prescriptions  Medication Sig Dispense Refill  . acetaminophen (TYLENOL) 650 MG CR tablet Take 650 mg by mouth every  8 (eight) hours as needed for pain.    . Aspirin-Salicylamide-Caffeine (BC HEADACHE POWDER PO) Take 1 Package by mouth daily as needed.    Marland Kitchen atenolol (TENORMIN) 25 MG tablet Take 1 tablet (25 mg total) by mouth 2 (two) times daily. 180 tablet 11  . calcium-vitamin D (OSCAL WITH D) 500-200 MG-UNIT per tablet Take 1 tablet by mouth every morning.     .  diltiazem (DILT-XR) 240 MG 24 hr capsule Take 1 capsule (240 mg total) by mouth daily. 90 capsule 11  . esomeprazole (NEXIUM) 20 MG capsule Take by mouth.    . etodolac (LODINE) 400 MG tablet Take by mouth.    . famotidine (PEPCID) 20 MG tablet Take by mouth.    . fluticasone (FLONASE) 50 MCG/ACT nasal spray Place 1 spray into both nostrils daily as needed for allergies or rhinitis.    Marland Kitchen gabapentin (NEURONTIN) 300 MG capsule Take 300 mg by mouth at bedtime. States she takes 2 hs  2  . hydrochlorothiazide (HYDRODIURIL) 25 MG tablet Take 1 tablet (25 mg total) by mouth every morning. 90 tablet 11  . Multiple Vitamin (MULITIVITAMIN WITH MINERALS) TABS Take 1 tablet by mouth every morning.     . Multiple Vitamins-Minerals (ICAPS AREDS FORMULA PO) Take by mouth.    . neomycin-polymyxin b-dexamethasone (MAXITROL) 3.5-10000-0.1 OINT     . promethazine (PHENERGAN) 25 MG tablet TK 1 T PO Q 6 H PRN N FOR UP TO 7 DAYS  0  . Red Yeast Rice 600 MG CAPS Take 1 capsule (600 mg total) by mouth daily.     No current facility-administered medications for this visit.    SURGICAL HISTORY:  Past Surgical History  Procedure Laterality Date  . Tonsillectomy    . Conization for cervical dysplasia    . Rul lobectomy  2010  . Esophogus stretching    . Cataract extraction, bilateral Bilateral     REVIEW OF SYSTEMS:  A comprehensive review of systems was negative except for: Constitutional: positive for fatigue   PHYSICAL EXAMINATION: General appearance: alert, cooperative and no distress Head: Normocephalic, without obvious abnormality, atraumatic, Surgical scar on the right side of the upper nose and below the eye secondary to excision of basal cell carcinoma Neck: no adenopathy Lymph nodes: Cervical, supraclavicular, and axillary nodes normal. Resp: clear to auscultation bilaterally Cardio: regular rate and rhythm, S1, S2 normal, no murmur, click, rub or gallop GI: soft, non-tender; bowel sounds normal; no  masses,  no organomegaly Extremities: extremities normal, atraumatic, no cyanosis or edema Neurologic: Alert and oriented X 3, normal strength and tone. Normal symmetric reflexes. Normal coordination and gait  ECOG PERFORMANCE STATUS: 1 - Symptomatic but completely ambulatory  Blood pressure 152/48, pulse 61, temperature 98.1 F (36.7 C), temperature source Oral, resp. rate 18, height 5' 5" (1.651 m), weight 159 lb 1.6 oz (72.167 kg), SpO2 96 %.  LABORATORY DATA: Lab Results  Component Value Date   WBC 9.6 11/10/2015   HGB 13.2 11/10/2015   HCT 40.4 11/10/2015   MCV 95.5 11/10/2015   PLT 211 11/10/2015      Chemistry      Component Value Date/Time   NA 142 11/10/2015 1024   NA 140 10/01/2014 0655   NA 137 01/16/2012 0941   K 4.0 11/10/2015 1024   K 4.1 10/01/2014 0655   K 4.1 01/16/2012 0941   CL 104 10/01/2014 0655   CL 105 07/17/2012 1434   CL 100 01/16/2012 0941   CO2  30* 11/10/2015 1024   CO2 24 10/01/2014 0655   CO2 28 01/16/2012 0941   BUN 23.5 11/10/2015 1024   BUN 30* 10/01/2014 0655   BUN 20 01/16/2012 0941   CREATININE 1.3* 11/10/2015 1024   CREATININE 1.36* 10/01/2014 0655   CREATININE 1.2 01/16/2012 0941      Component Value Date/Time   CALCIUM 9.8 11/10/2015 1024   CALCIUM 9.6 10/01/2014 0655   CALCIUM 9.1 01/16/2012 0941   ALKPHOS 56 11/10/2015 1024   ALKPHOS 59 10/01/2014 0655   ALKPHOS 46 01/16/2012 0941   AST 15 11/10/2015 1024   AST 39* 10/01/2014 0655   AST 23 01/16/2012 0941   ALT 20 11/10/2015 1024   ALT 42* 10/01/2014 0655   ALT 21 01/16/2012 0941   BILITOT 0.67 11/10/2015 1024   BILITOT 0.6 10/01/2014 0655   BILITOT 0.60 01/16/2012 0941       RADIOGRAPHIC STUDIES: Ct Chest W Contrast  11/10/2015  CLINICAL DATA:  Subsequent treatment evaluation for lung carcinoma. EXAM: CT CHEST WITH CONTRAST TECHNIQUE: Multidetector CT imaging of the chest was performed during intravenous contrast administration. CONTRAST:  41m ISOVUE-300 IOPAMIDOL  (ISOVUE-300) INJECTION 61% COMPARISON:  PET-CT 02/21/2016 FINDINGS: Mediastinum/Nodes: No axillary or supraclavicular lymphadenopathy. No mediastinal hilar adenopathy. No central pulmonary embolism. No pericardial fluid esophagus normal Lungs/Pleura: LEFT upper lobe masslike consolidation at treatment site measures 30 x 22 mm compared to 29 x 28 mm on prior for no significant change and potential mild contraction. Inferior to the dominant consolidation, nodule anterior to the oblique fissure in the LEFT upper lobe measures 13 mm (image 47, series 6) compared to 12 mm for no significant change. The nodule does appear more solid. More peripheral LEFT upper lobe nodule measuring 9 mm on image 51 is unchanged. Ground-glass nodule in the superior aspect of the LEFT lower lobe (image 55, series 6) is unchanged. In the RIGHT lung, 2 ground-glass nodules in the superior segment of the RIGHT lower lobe posterior to the fissure on image 34, series 6 which are unchanged Upper abdomen: Large hiatal hernia present. Adrenal glands are normal. Scarring of the LEFT kidney noted. Musculoskeletal: No aggressive osseous lesion. IMPRESSION: 1. Stable masslike consolidation in the LEFT upper lobe at treatment site. 2. Additional LEFT upper lobe pulmonary nodules are essentially stable. Recommend attention on follow-up . 3. Bilateral lower lobe ground-glass nodules. Recommend attention on follow-up. Electronically Signed   By: SSuzy BouchardM.D.   On: 11/10/2015 12:47   ASSESSMENT AND PLAN: This is a very pleasant 75years old white female with recurrent non-small cell lung cancer, adenocarcinoma with conflicting reports regarding the EGFR mutation including the recent one from the biopsy of the left upper lobe lung nodule that showed mutation at exon 18. She recently completed a course of stereotactic radiotherapy to the left prevascular lymphadenopathy under the care of Dr. WPablo Ledger She was recently treated with a course of  Tarceva initially at 150 mg by mouth daily for one month followed by 1 week treatment at a reduced dose of 100 mg by mouth daily but unfortunately the patient was unable to tolerate her treatment and this was discontinued. Restaging CT scan of the chest showed evidence for mild enlargement of the left upper lobe lung nodule with stable disease in other areas. She was not a candidate for repeat stereotactic radiotherapy to this lesion.  She underwent thermal ablation of the left upper lobe nodule by interventional radiology and tolerated the procedure well. Restaging CT scan of the  chest showed no evidence for disease progression. I discussed the scan results with the patient and her husband today. I recommended for her to continue on observation. I will arrange for the patient to have repeat CT scan of the chest with contrast in 3 months for reevaluation of her disease. She was advised to call immediately if she has any concerning symptoms in the interval.  All questions were answered. The patient knows to call the clinic with any problems, questions or concerns. We can certainly see the patient much sooner if necessary.  Disclaimer: This note was dictated with voice recognition software. Similar sounding words can inadvertently be transcribed and may not be corrected upon review.

## 2015-11-23 NOTE — Telephone Encounter (Signed)
per pof to sch pt appt-adv central sch willc all ot sch trmt-gave pt copy of avs

## 2015-11-24 DIAGNOSIS — H0289 Other specified disorders of eyelid: Secondary | ICD-10-CM | POA: Diagnosis not present

## 2015-11-24 DIAGNOSIS — C44119 Basal cell carcinoma of skin of left eyelid, including canthus: Secondary | ICD-10-CM | POA: Diagnosis not present

## 2015-11-25 ENCOUNTER — Other Ambulatory Visit (HOSPITAL_COMMUNITY): Payer: Self-pay | Admitting: Interventional Radiology

## 2015-11-25 DIAGNOSIS — C3412 Malignant neoplasm of upper lobe, left bronchus or lung: Secondary | ICD-10-CM

## 2015-11-30 DIAGNOSIS — Z85828 Personal history of other malignant neoplasm of skin: Secondary | ICD-10-CM | POA: Diagnosis not present

## 2015-11-30 DIAGNOSIS — L570 Actinic keratosis: Secondary | ICD-10-CM | POA: Diagnosis not present

## 2015-11-30 DIAGNOSIS — L814 Other melanin hyperpigmentation: Secondary | ICD-10-CM | POA: Diagnosis not present

## 2015-11-30 DIAGNOSIS — L821 Other seborrheic keratosis: Secondary | ICD-10-CM | POA: Diagnosis not present

## 2015-11-30 DIAGNOSIS — Z85820 Personal history of malignant melanoma of skin: Secondary | ICD-10-CM | POA: Diagnosis not present

## 2015-11-30 DIAGNOSIS — D1801 Hemangioma of skin and subcutaneous tissue: Secondary | ICD-10-CM | POA: Diagnosis not present

## 2015-11-30 DIAGNOSIS — D225 Melanocytic nevi of trunk: Secondary | ICD-10-CM | POA: Diagnosis not present

## 2015-12-10 ENCOUNTER — Ambulatory Visit
Admission: RE | Admit: 2015-12-10 | Discharge: 2015-12-10 | Disposition: A | Discharge status: Home / Self Care | Payer: Medicare Other | Source: Ambulatory Visit | Attending: Interventional Radiology | Admitting: Interventional Radiology

## 2015-12-10 DIAGNOSIS — C3412 Malignant neoplasm of upper lobe, left bronchus or lung: Secondary | ICD-10-CM | POA: Diagnosis not present

## 2015-12-10 NOTE — Progress Notes (Signed)
Patient ID: [name], female   DOB: [birth year], 75 y.o.   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 She also has a history of stage IA malignant melanoma status post wide excision 2010 as well. She is status post right upper lobectomy and lymph node dissection in November 2009, stereotactic radiotherapy to left upper lobe lung nodule completed in 2013, stereotactic radiotherapy to left prevascular lymphadenopathy completed 2015. On 10/01/14 she underwent CT-guided thermal ablation of a new, enlarging nodule in the left upper lobe which was adjacent to the prior radiation field. She tolerated the procedure well and was discharged home the following day. She presents today for routine outpatient follow-up. Since the above procedure she has undergone recent excision and skin graft of the left eyelid for basal cell carcinoma in [facility]. She was seen recently by Dr. Earlie Server on 11/23/15. She currently denies fever, headache, chest pain, dyspnea, cough, abdominal/back pain, nausea vomiting or abnormal bleeding. Previously noted fatigue has improved since iron and  B-12 supplementation. Follow-up CT chest on 11/10/15 revealed stable findings.  Past Medical History  Diagnosis Date  . Hypertension   . Tremor     hand- intention tremor  . lung ca dx'd 07/2008    lung- right non small cell, adenocarcinoma w/ broncho- alveolar features  . Melanoma ([place])     left leg  . Cervical ca ([place]) dx'd 1988    surg only  . Radiation 10/11/11-10/21/11    Left upper lobe adenocarcinoma  . Hx of radiation therapy 09/16/13-09/30/13    prevascular lymph node  . LBBB (left bundle branch  block)   . Complication of anesthesia     difficulty awakening, dysrhythmia post surgery, (prolonged sedation level)  . Lung cancer ([place])     Bilaterally, RUL lobectomy and spot involving both lungs- "cancer"  . Headache     occ. sinus type headaches  . Arthritis     back.  . Macular degeneration     bilateral  . Hyperthyroidism     dr Loanne Drilling- radioactive iodine treatment 2011- resolved    Past Surgical History  Procedure Laterality Date  . Tonsillectomy    . Conization for cervical dysplasia    . Rul lobectomy  2010  . Esophogus stretching    . Cataract extraction, bilateral Bilateral     Allergies: Meperidine hcl; Penicillins; Morphine; Oxycodone-acetaminophen; Propoxyphene n-acetaminophen; and Tramadol hcl  Medications: Prior to Admission medications   Medication Sig Start Date End Date Taking? Authorizing Provider  acetaminophen (TYLENOL) 650 MG CR tablet Take 650 mg by mouth every 8 (eight) hours as needed for pain.    Historical Provider, MD  Aspirin-Salicylamide-Caffeine (BC HEADACHE POWDER PO) Take 1 Package by mouth daily as needed.    Historical Provider, MD  atenolol (TENORMIN) 25 MG tablet Take 1 tablet (25 mg total) by mouth 2 (two) times daily. 08/19/15   Dorothy Spark, MD  calcium-vitamin D (OSCAL WITH D) 500-200 MG-UNIT per tablet Take 1 tablet by mouth every morning.     Historical Provider, MD  diltiazem (DILT-XR) 240 MG 24 hr capsule Take 1 capsule (240 mg total) by mouth daily. 08/19/15   Dorothy Spark, MD  esomeprazole (Comfort)  20 MG capsule Take by mouth.    Historical Provider, MD  etodolac (LODINE) 400 MG tablet Take by mouth.    Historical Provider, MD  famotidine (PEPCID) 20 MG tablet Take by mouth.    Historical Provider, MD  fluticasone (FLONASE) 50 MCG/ACT nasal spray Place 1 spray into both nostrils daily as needed for allergies or rhinitis.    Historical Provider, MD  gabapentin (NEURONTIN) 300 MG capsule Take 300 mg by mouth at bedtime.  States she takes 2 hs 07/31/14   Historical Provider, MD  hydrochlorothiazide (HYDRODIURIL) 25 MG tablet Take 1 tablet (25 mg total) by mouth every morning. 08/19/15   Dorothy Spark, MD  Multiple Vitamin (MULITIVITAMIN WITH MINERALS) TABS Take 1 tablet by mouth every morning.     Historical Provider, MD  Multiple Vitamins-Minerals (ICAPS AREDS FORMULA PO) Take by mouth.    Historical Provider, MD  neomycin-polymyxin b-dexamethasone (MAXITROL) 3.5-10000-0.1 OINT  11/11/15   Historical Provider, MD  promethazine (PHENERGAN) 25 MG tablet TK 1 T PO Q 6 H PRN N FOR UP TO 7 DAYS 10/15/15   Historical Provider, MD  Red Yeast Rice 600 MG CAPS Take 1 capsule (600 mg total) by mouth daily. 08/20/15   Dorothy Spark, MD     Family History  Problem Relation Age of Onset  . Asthma Mother   . Heart disease Mother   . Thyroid disease Daughter     hypothyroidism  . Cancer Maternal Uncle     lung  . Cancer Maternal Grandfather     lung  . Diabetes Neg Hx   . Coronary artery disease Neg Hx     Social History   Social History  . Marital Status: Married    Spouse Name: N/A  . Number of Children: 2  . Years of Education: N/A   Occupational History  . homemaker    Social History Main Topics  . Smoking status: Former Smoker -- 1.00 packs/day for 17 years    Types: Cigarettes    Quit date: 09/28/1975  . Smokeless tobacco: Never Used     Comment: 33 yrs ago  . Alcohol Use: Yes     Comment: glass wine daily  . Drug Use: No  . Sexual Activity: Not on file   Other Topics Concern  . Not on file   Social History Narrative   [employer] graduate   Married [phone]   4 grandchildren and 2 step-grandchildren   Marriage in good health            Physician Roster:   Oncologist- Dr Julien Nordmann   Surgeon- Dr Arlyce Dice   Gyn- Dr Mable Fill- Dr Allyson Sabal     Vital Signs: BP 169/69 mmHg  Pulse 68  Temp(Src) 97.7 F (36.5 C) (Oral)  Resp 15  SpO2 98%  Physical Exam patient awake, alert. Chest clear to  auscultation bilaterally. Puncture site left anterior chest clean, dry, nontender, no hematoma; heart with regular rate and rhythm. Abdomen soft, positive bowel sounds, nontender. Lower extremities with full range of motion and no significant edema.  Imaging: No results found.  Labs:  CBC:  Recent Labs  01/05/15 0840 07/14/15 1000 11/10/15 1024  WBC 6.5 6.7 9.6  HGB 11.1* 13.4 13.2  HCT 36.2 41.0 40.4  PLT 237 177 211    COAGS: No results for input(s): INR, APTT in the last 8760 hours.  BMP:  Recent Labs  01/05/15 0840 07/14/15 1000 11/10/15 1024  NA 144 143 142  K 4.0 4.2 4.0  CO2 29 31* 30*  GLUCOSE 92 100 92  BUN 22.2 21.4 23.5  CALCIUM 9.2 9.6 9.8  CREATININE 1.3* 1.3* 1.3*    LIVER FUNCTION TESTS:  Recent Labs  01/05/15 0840 07/14/15 1000 11/10/15 1024  BILITOT 0.25 0.40 0.67  AST '18 19 15  '$ ALT '20 17 20  '$ ALKPHOS 59 67 56  PROT 6.0* 6.1* 6.1*  ALBUMIN 3.5 3.4* 3.5    Assessment: Patient with history of multifocal bronchogenic adenocarcinoma with bronchoalveolar features, also with prior melanoma. Status post CT-guided thermal ablation of a new enlarging nodule left upper lobe adjacent to prior radiation field on 10/01/14. Patient currently stable. Status post  follow-up with Dr. Earlie Server on 11/23/15. Recent CT chest on 11/10/15 demonstrates no evidence for disease progression. Recommendations from Dr. Earlie Server were follow-up CT chest in 3 months and continued observation status. She was told to contact our service with any additional questions or concerns.   Signed: D. Rowe Robert 12/10/2015, 1:58 PM   Please refer to Dr. Katrinka Blazing attestation of this note for management and plan.

## 2015-12-28 DIAGNOSIS — M5126 Other intervertebral disc displacement, lumbar region: Secondary | ICD-10-CM | POA: Diagnosis not present

## 2015-12-28 DIAGNOSIS — M5136 Other intervertebral disc degeneration, lumbar region: Secondary | ICD-10-CM | POA: Diagnosis not present

## 2016-01-28 ENCOUNTER — Ambulatory Visit (HOSPITAL_COMMUNITY): Payer: Medicare Other

## 2016-02-17 ENCOUNTER — Ambulatory Visit (HOSPITAL_COMMUNITY)
Admission: RE | Admit: 2016-02-17 | Discharge: 2016-02-17 | Disposition: A | Discharge status: Home / Self Care | Payer: Medicare Other | Source: Ambulatory Visit | Attending: Internal Medicine | Admitting: Internal Medicine

## 2016-02-17 ENCOUNTER — Other Ambulatory Visit (HOSPITAL_BASED_OUTPATIENT_CLINIC_OR_DEPARTMENT_OTHER): Payer: Medicare Other

## 2016-02-17 ENCOUNTER — Encounter (HOSPITAL_COMMUNITY): Payer: Self-pay

## 2016-02-17 DIAGNOSIS — I700 Atherosclerosis of aorta: Secondary | ICD-10-CM | POA: Insufficient documentation

## 2016-02-17 DIAGNOSIS — I2510 Atherosclerotic heart disease of native coronary artery without angina pectoris: Secondary | ICD-10-CM | POA: Diagnosis not present

## 2016-02-17 DIAGNOSIS — C3412 Malignant neoplasm of upper lobe, left bronchus or lung: Secondary | ICD-10-CM | POA: Diagnosis not present

## 2016-02-17 DIAGNOSIS — R918 Other nonspecific abnormal finding of lung field: Secondary | ICD-10-CM | POA: Diagnosis not present

## 2016-02-17 DIAGNOSIS — C3490 Malignant neoplasm of unspecified part of unspecified bronchus or lung: Secondary | ICD-10-CM | POA: Diagnosis not present

## 2016-02-17 LAB — COMPREHENSIVE METABOLIC PANEL
ALT: 15 U/L (ref 0–55)
ANION GAP: 10 meq/L (ref 3–11)
AST: 16 U/L (ref 5–34)
Albumin: 3.3 g/dL — ABNORMAL LOW (ref 3.5–5.0)
Alkaline Phosphatase: 70 U/L (ref 40–150)
BUN: 24.5 mg/dL (ref 7.0–26.0)
CHLORIDE: 104 meq/L (ref 98–109)
CO2: 28 meq/L (ref 22–29)
Calcium: 9.9 mg/dL (ref 8.4–10.4)
Creatinine: 1.3 mg/dL — ABNORMAL HIGH (ref 0.6–1.1)
EGFR: 39 mL/min/{1.73_m2} — AB (ref >90)
Glucose: 93 mg/dl (ref 70–140)
POTASSIUM: 4 meq/L (ref 3.5–5.1)
Sodium: 142 mEq/L (ref 136–145)
Total Bilirubin: 0.56 mg/dL (ref 0.20–1.20)
Total Protein: 6.2 g/dL — ABNORMAL LOW (ref 6.4–8.3)

## 2016-02-17 LAB — CBC WITH DIFFERENTIAL/PLATELET
BASO%: 1.1 % (ref 0.0–2.0)
Basophils Absolute: 0.1 10*3/uL (ref 0.0–0.1)
EOS ABS: 0.8 10*3/uL — AB (ref 0.0–0.5)
EOS%: 10.4 % — ABNORMAL HIGH (ref 0.0–7.0)
HCT: 38.1 % (ref 34.8–46.6)
HGB: 12.4 g/dL (ref 11.6–15.9)
LYMPH%: 17.4 % (ref 14.0–49.7)
MCH: 31 pg (ref 25.1–34.0)
MCHC: 32.6 g/dL (ref 31.5–36.0)
MCV: 95 fL (ref 79.5–101.0)
MONO#: 1 10*3/uL — AB (ref 0.1–0.9)
MONO%: 13.3 % (ref 0.0–14.0)
NEUT#: 4.3 10*3/uL (ref 1.5–6.5)
NEUT%: 57.8 % (ref 38.4–76.8)
PLATELETS: 221 10*3/uL (ref 145–400)
RBC: 4.01 10*6/uL (ref 3.70–5.45)
RDW: 13.1 % (ref 11.2–14.5)
WBC: 7.4 10*3/uL (ref 3.9–10.3)
lymph#: 1.3 10*3/uL (ref 0.9–3.3)

## 2016-02-17 MED ORDER — IOPAMIDOL (ISOVUE-300) INJECTION 61%
75.0000 mL | Freq: Once | INTRAVENOUS | Status: AC | PRN
  Administered 2016-02-17: 60 mL via INTRAVENOUS

## 2016-02-24 ENCOUNTER — Encounter: Payer: Self-pay | Admitting: Internal Medicine

## 2016-02-24 ENCOUNTER — Ambulatory Visit (HOSPITAL_BASED_OUTPATIENT_CLINIC_OR_DEPARTMENT_OTHER): Payer: Medicare Other | Admitting: Internal Medicine

## 2016-02-24 VITALS — BP 160/71 | HR 82 | Temp 98.3°F | Resp 18 | Ht 65.0 in | Wt 159.6 lb

## 2016-02-24 DIAGNOSIS — C3412 Malignant neoplasm of upper lobe, left bronchus or lung: Secondary | ICD-10-CM | POA: Diagnosis not present

## 2016-02-24 DIAGNOSIS — C3492 Malignant neoplasm of unspecified part of left bronchus or lung: Secondary | ICD-10-CM

## 2016-02-24 DIAGNOSIS — R530 Neoplastic (malignant) related fatigue: Secondary | ICD-10-CM | POA: Diagnosis not present

## 2016-02-24 NOTE — Progress Notes (Signed)
[facility] [phone]   [phone]  OFFICE PROGRESS NOTE  No PCP Per Patient No address on file  PRINCIPAL DIAGNOSES:  1. 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 The patient presented at that time with a right upper lobe lesion, as well as suspicious ground-glass opacities in the left lung. The tumor was negative for EGFR mutation and negative for ALK gene translocation. Veristrat test Good. 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 2. Stage IA malignant melanoma, status post wide excision by Dr. Allyson Sabal on Nov 12, 2008.  PRIOR THERAPY:  1) Status post right upper lobectomy with lymph node dissection under the care of Dr. Arlyce Dice on May 21, 2008.  2) Stereotactic radiotherapy to the left upper lobe lung nodule under the care of Dr. Pablo Ledger expected to be completed on 10/21/2011.  3) stereotactic radiotherapy to the left prevascular lymphadenopathy under the care of Dr. Pablo Ledger completed on 09/26/2013. 4) Tarceva 150 mg by mouth daily started 11/29/2013 discontinued today secondary to intolerance with persistent diarrhea and fatigue.  5) Tarceva 100 mg by mouth daily started on 12/27/2013. Discontinued one week after treatment because of intolerance. 6) status post thermal ablation of the left upper lobe recurrent adenocarcinoma under the care of Dr. Laurence Ferrari on 10/01/2014  CURRENT THERAPY: Observation.  Advanced directives: The patient has advanced directive and no change is requested.  INTERVAL HISTORY: [name] 75 y.o. female returns to the clinic today for follow up visit. The patient has no complaints today except for fatigue. She is under stress  recently taking care of her husband who had metastatic disease to the brain from the renal cell carcinoma. She denied having any significant weight loss or night sweats. The patient denied having any fever or chills. She has no nausea or vomiting. She denied having any significant chest pain, shortness of breath, cough or hemoptysis. She had repeat CT scan of the chest and she is here today for evaluation and discussion of her scan results.  MEDICAL HISTORY: Past Medical History:  Diagnosis Date  . Arthritis    back.  . Cervical ca ([place]) dx'd 1988   surg only  . Complication of anesthesia    difficulty awakening, dysrhythmia post surgery, (prolonged sedation level)  . Headache    occ. sinus type headaches  . Hx of radiation therapy 09/16/13-09/30/13   prevascular lymph node  . Hypertension   . Hyperthyroidism    dr Loanne Drilling- radioactive iodine treatment 2011- resolved  . LBBB (left bundle branch block)   . lung ca dx'd 07/2008   lung- right non small cell, adenocarcinoma w/ broncho- alveolar features  . Lung cancer ([place])    Bilaterally, RUL lobectomy and spot involving both lungs- "cancer"  . Macular degeneration    bilateral  . Melanoma ([place])    left leg  . Radiation 10/11/11-10/21/11   Left upper lobe adenocarcinoma  . Tremor    hand- intention tremor    ALLERGIES:  is allergic to meperidine hcl; penicillins; morphine; oxycodone-acetaminophen; propoxyphene n-acetaminophen; and tramadol hcl.  MEDICATIONS:  Current Outpatient Prescriptions  Medication Sig Dispense Refill  . acetaminophen (TYLENOL) 650 MG CR tablet Take 650 mg by mouth every 8 (eight) hours as needed for  pain.    . Aspirin-Salicylamide-Caffeine (BC HEADACHE POWDER PO) Take 1 Package by mouth daily as needed.    Marland Kitchen atenolol (TENORMIN) 25 MG tablet Take 1 tablet (25 mg total) by mouth 2 (two) times daily. 180 tablet 11  . calcium-vitamin D (OSCAL WITH D) 500-200 MG-UNIT per tablet Take 1 tablet by mouth every morning.      . diltiazem (DILT-XR) 240 MG 24 hr capsule Take 1 capsule (240 mg total) by mouth daily. 90 capsule 11  . esomeprazole (NEXIUM) 20 MG capsule Take by mouth.    . etodolac (LODINE) 400 MG tablet Take by mouth.    . famotidine (PEPCID) 20 MG tablet Take by mouth.    . fluticasone (FLONASE) 50 MCG/ACT nasal spray Place 1 spray into both nostrils daily as needed for allergies or rhinitis.    Marland Kitchen gabapentin (NEURONTIN) 300 MG capsule Take 300 mg by mouth at bedtime. States she takes 2 hs  2  . hydrochlorothiazide (HYDRODIURIL) 25 MG tablet Take 1 tablet (25 mg total) by mouth every morning. 90 tablet 11  . Multiple Vitamin (MULITIVITAMIN WITH MINERALS) TABS Take 1 tablet by mouth every morning.     . Multiple Vitamins-Minerals (ICAPS AREDS FORMULA PO) Take by mouth.    . neomycin-polymyxin b-dexamethasone (MAXITROL) 3.5-10000-0.1 OINT     . promethazine (PHENERGAN) 25 MG tablet TK 1 T PO Q 6 H PRN N FOR UP TO 7 DAYS  0  . Red Yeast Rice 600 MG CAPS Take 1 capsule (600 mg total) by mouth daily.     No current facility-administered medications for this visit.     SURGICAL HISTORY:  Past Surgical History:  Procedure Laterality Date  . CATARACT EXTRACTION, BILATERAL Bilateral   . conization for cervical dysplasia    . esophogus stretching    . rul lobectomy  2010  . TONSILLECTOMY      REVIEW OF SYSTEMS:  A comprehensive review of systems was negative except for: Constitutional: positive for fatigue   PHYSICAL EXAMINATION: General appearance: alert, cooperative and no distress Head: Normocephalic, without obvious abnormality, atraumatic, Surgical scar on the right side of the upper nose and below the eye secondary to excision of basal cell carcinoma Neck: no adenopathy Lymph nodes: Cervical, supraclavicular, and axillary nodes normal. Resp: clear to auscultation bilaterally Cardio: regular rate and rhythm, S1, S2 normal, no murmur, click, rub or gallop GI: soft, non-tender; bowel sounds  normal; no masses,  no organomegaly Extremities: extremities normal, atraumatic, no cyanosis or edema Neurologic: Alert and oriented X 3, normal strength and tone. Normal symmetric reflexes. Normal coordination and gait  ECOG PERFORMANCE STATUS: 1 - Symptomatic but completely ambulatory  There were no vitals taken for this visit.  LABORATORY DATA: Lab Results  Component Value Date   WBC 7.4 02/17/2016   HGB 12.4 02/17/2016   HCT 38.1 02/17/2016   MCV 95.0 02/17/2016   PLT 221 02/17/2016      Chemistry      Component Value Date/Time   NA 142 02/17/2016 0906   K 4.0 02/17/2016 0906   CL 104 10/01/2014 0655   CL 105 07/17/2012 1434   CO2 28 02/17/2016 0906   BUN 24.5 02/17/2016 0906   CREATININE 1.3 (H) 02/17/2016 0906      Component Value Date/Time   CALCIUM 9.9 02/17/2016 0906   ALKPHOS 70 02/17/2016 0906   AST 16 02/17/2016 0906   ALT 15 02/17/2016 0906   BILITOT 0.56 02/17/2016 0906  RADIOGRAPHIC STUDIES: Ct Chest W Contrast  Result Date: 02/17/2016 CLINICAL DATA:  Lung cancer, radiofrequency ablation of the lung, right upper lobectomy. EXAM: CT CHEST WITH CONTRAST TECHNIQUE: Multidetector CT imaging of the chest was performed during intravenous contrast administration. CONTRAST:  37m ISOVUE-300 IOPAMIDOL (ISOVUE-300) INJECTION 61% COMPARISON:  11/10/2015. FINDINGS: Cardiovascular: Atherosclerotic calcification of the arterial vasculature, including coronary arteries. Heart size normal. No pericardial effusion. Mediastinum/Nodes: Thyroid is mildly heterogeneous. Mediastinal lymph nodes are not enlarged by CT size criteria. No hilar or axillary adenopathy. Esophagus is grossly unremarkable. Small hiatal hernia. Lungs/Pleura: Left upper lobe mass measures 2.4 x 2.7 cm, stable. Surrounding ground-glass, architectural distortion and volume loss. A separate cavitary nodule is seen in the left upper lobe, measuring 10 mm (series 5, image 46), similar. Focal patchy  ground-glass in the anterior left lower lobe, as before. A ground-glass nodule is not excluded. 9 mm left lower lobe nodule (image 43), previously 6 mm. Additional nodular lesions are seen in the medial left lower lobe (images 83 and 107), stable. Right upper lobectomy. Additional scattered amorphous ground-glass in the right lung. No solid components. No pleural fluid. Upper Abdomen: Visualized portions of the liver, gallbladder and adrenal glands are unremarkable. 2.0 cm hyper attenuating lesion off the upper pole right kidney is difficult to definitively characterize without precontrast imaging but a hyperdense cyst is favored. Additional sub cm low-attenuation lesions are seen in the kidneys bilaterally, too small to characterize but likely cysts. 11 mm low-attenuation lesion in the upper pole left kidney is likely a cyst. Left renal cortical scarring. Visualized portions of the spleen, pancreas, stomach and bowel are unremarkable with exception of a small hiatal hernia. Aortic atherosclerosis. No upper abdominal adenopathy. Musculoskeletal: No worrisome lytic or sclerotic lesions. IMPRESSION: 1. Primary left upper lobe mass is stable. Additional nodules in the left lung, one of which is larger in the left lower lobe. Continued attention on followup exams is warranted as additional sites of bronchogenic carcinoma cannot be excluded. 2. Aortic atherosclerosis and coronary artery calcification. Electronically Signed   By: MLorin PicketM.D.   On: 02/17/2016 13:49   ASSESSMENT AND PLAN: This is a very pleasant 75years old white female with recurrent non-small cell lung cancer, adenocarcinoma with conflicting reports regarding the EGFR mutation including the recent one from the biopsy of the left upper lobe lung nodule that showed mutation at exon 18. She recently completed a course of stereotactic radiotherapy to the left prevascular lymphadenopathy under the care of Dr. WPablo Ledger She was recently treated  with a course of Tarceva initially at 150 mg by mouth daily for one month followed by 1 week treatment at a reduced dose of 100 mg by mouth daily but unfortunately the patient was unable to tolerate her treatment and this was discontinued. Restaging CT scan of the chest showed evidence for mild enlargement of the left upper lobe lung nodule with stable disease in other areas. She was not a candidate for repeat stereotactic radiotherapy to this lesion.  She underwent thermal ablation of the left upper lobe nodule by interventional radiology and tolerated the procedure well. The recent CT scan of the chest showed stable disease except for slightly enlarging left lower lobe nodule. I discussed the scan results and showed the images to the patient today. I recommended for her to continue on observation. I will arrange for the patient to have repeat CT scan of the chest with contrast in 3 months for reevaluation of her disease. She was advised  to call immediately if she has any concerning symptoms in the interval.  All questions were answered. The patient knows to call the clinic with any problems, questions or concerns. We can certainly see the patient much sooner if necessary.  Disclaimer: This note was dictated with voice recognition software. Similar sounding words can inadvertently be transcribed and may not be corrected upon review.

## 2016-02-29 ENCOUNTER — Telehealth: Payer: Self-pay | Admitting: Internal Medicine

## 2016-02-29 NOTE — Telephone Encounter (Signed)
Appointment dates conf with patient. 02/29/16

## 2016-03-03 DIAGNOSIS — M791 Myalgia: Secondary | ICD-10-CM | POA: Diagnosis not present

## 2016-03-03 DIAGNOSIS — M5136 Other intervertebral disc degeneration, lumbar region: Secondary | ICD-10-CM | POA: Diagnosis not present

## 2016-03-03 DIAGNOSIS — M47816 Spondylosis without myelopathy or radiculopathy, lumbar region: Secondary | ICD-10-CM | POA: Diagnosis not present

## 2016-03-03 DIAGNOSIS — M545 Low back pain: Secondary | ICD-10-CM | POA: Diagnosis not present

## 2016-03-06 ENCOUNTER — Encounter (HOSPITAL_COMMUNITY): Payer: Self-pay | Admitting: *Deleted

## 2016-03-06 ENCOUNTER — Emergency Department (HOSPITAL_COMMUNITY)
Admission: EM | Admit: 2016-03-06 | Discharge: 2016-03-07 | Disposition: A | Discharge status: Home / Self Care | Payer: Medicare Other | Attending: Physician Assistant | Admitting: Physician Assistant

## 2016-03-06 DIAGNOSIS — Z85118 Personal history of other malignant neoplasm of bronchus and lung: Secondary | ICD-10-CM | POA: Insufficient documentation

## 2016-03-06 DIAGNOSIS — Z85820 Personal history of malignant melanoma of skin: Secondary | ICD-10-CM | POA: Diagnosis not present

## 2016-03-06 DIAGNOSIS — Z79899 Other long term (current) drug therapy: Secondary | ICD-10-CM | POA: Insufficient documentation

## 2016-03-06 DIAGNOSIS — I2510 Atherosclerotic heart disease of native coronary artery without angina pectoris: Secondary | ICD-10-CM | POA: Diagnosis not present

## 2016-03-06 DIAGNOSIS — R197 Diarrhea, unspecified: Secondary | ICD-10-CM | POA: Diagnosis not present

## 2016-03-06 DIAGNOSIS — R1030 Lower abdominal pain, unspecified: Secondary | ICD-10-CM | POA: Diagnosis not present

## 2016-03-06 DIAGNOSIS — R110 Nausea: Secondary | ICD-10-CM | POA: Insufficient documentation

## 2016-03-06 DIAGNOSIS — Z7982 Long term (current) use of aspirin: Secondary | ICD-10-CM | POA: Diagnosis not present

## 2016-03-06 DIAGNOSIS — Z8541 Personal history of malignant neoplasm of cervix uteri: Secondary | ICD-10-CM | POA: Insufficient documentation

## 2016-03-06 DIAGNOSIS — Z87891 Personal history of nicotine dependence: Secondary | ICD-10-CM | POA: Diagnosis not present

## 2016-03-06 DIAGNOSIS — I10 Essential (primary) hypertension: Secondary | ICD-10-CM | POA: Insufficient documentation

## 2016-03-06 DIAGNOSIS — R030 Elevated blood-pressure reading, without diagnosis of hypertension: Secondary | ICD-10-CM | POA: Diagnosis not present

## 2016-03-06 LAB — COMPREHENSIVE METABOLIC PANEL
ALBUMIN: 4 g/dL (ref 3.5–5.0)
ALK PHOS: 69 U/L (ref 38–126)
ALT: 15 U/L (ref 14–54)
AST: 18 U/L (ref 15–41)
Anion gap: 14 (ref 5–15)
BUN: 22 mg/dL — ABNORMAL HIGH (ref 6–20)
CALCIUM: 9.9 mg/dL (ref 8.9–10.3)
CHLORIDE: 98 mmol/L — AB (ref 101–111)
CO2: 21 mmol/L — AB (ref 22–32)
CREATININE: 1.24 mg/dL — AB (ref 0.44–1.00)
GFR calc Af Amer: 48 mL/min — ABNORMAL LOW (ref >60)
GFR calc non Af Amer: 41 mL/min — ABNORMAL LOW (ref >60)
GLUCOSE: 140 mg/dL — AB (ref 65–99)
Potassium: 3.5 mmol/L (ref 3.5–5.1)
SODIUM: 133 mmol/L — AB (ref 135–145)
Total Bilirubin: 0.5 mg/dL (ref 0.3–1.2)
Total Protein: 6.9 g/dL (ref 6.5–8.1)

## 2016-03-06 LAB — CBC WITH DIFFERENTIAL/PLATELET
BASOS ABS: 0 10*3/uL (ref 0.0–0.1)
Basophils Relative: 0 %
EOS ABS: 0 10*3/uL (ref 0.0–0.7)
Eosinophils Relative: 0 %
HCT: 46.6 % — ABNORMAL HIGH (ref 36.0–46.0)
HEMOGLOBIN: 15.1 g/dL — AB (ref 12.0–15.0)
LYMPHS ABS: 2.6 10*3/uL (ref 0.7–4.0)
Lymphocytes Relative: 14 %
MCH: 30.8 pg (ref 26.0–34.0)
MCHC: 32.4 g/dL (ref 30.0–36.0)
MCV: 95.1 fL (ref 78.0–100.0)
MONO ABS: 1.9 10*3/uL — AB (ref 0.1–1.0)
Monocytes Relative: 10 %
NEUTROS PCT: 76 %
Neutro Abs: 14.1 10*3/uL — ABNORMAL HIGH (ref 1.7–7.7)
PLATELETS: 329 10*3/uL (ref 150–400)
RBC: 4.9 MIL/uL (ref 3.87–5.11)
RDW: 12.5 % (ref 11.5–15.5)
WBC: 18.6 10*3/uL — AB (ref 4.0–10.5)

## 2016-03-06 MED ORDER — IOPAMIDOL (ISOVUE-300) INJECTION 61%
INTRAVENOUS | Status: AC
  Administered 2016-03-07: 80 mL
  Filled 2016-03-06: qty 100

## 2016-03-06 MED ORDER — ONDANSETRON HCL 4 MG/2ML IJ SOLN
4.0000 mg | Freq: Once | INTRAMUSCULAR | Status: AC
  Administered 2016-03-07: 4 mg via INTRAVENOUS
  Filled 2016-03-06: qty 2

## 2016-03-06 MED ORDER — SODIUM CHLORIDE 0.9 % IV BOLUS (SEPSIS)
500.0000 mL | Freq: Once | INTRAVENOUS | Status: AC
  Administered 2016-03-07: 500 mL via INTRAVENOUS

## 2016-03-06 NOTE — ED Notes (Signed)
EMS reports BP 210/110, P 115, temp 97.5

## 2016-03-06 NOTE — ED Notes (Signed)
Cortisone shot on Thursday for her back

## 2016-03-06 NOTE — ED Triage Notes (Signed)
Patient presents with c/o nausea and diarrhea since Friday. Has been alternating Immodium and Pepto  Diarrhea 2 times and nausea.  Zofran '4mg'$  given by EMS

## 2016-03-06 NOTE — ED Notes (Signed)
The pt has been nauseated since Friday  With abd cramps and vomiting  Not eating phenergan  Has been taken for   Diarrhea  Hot and cold  spells

## 2016-03-06 NOTE — ED Provider Notes (Signed)
[facility] DEPT Provider Note   CSN: [id number] Arrival date & time: 03/06/16  2021     History   Chief Complaint Chief Complaint  Patient presents with  . Nausea  . Diarrhea    HPI [name] is a 75 y.o. female.  Nausea and diarrhea x 3 days associated with lower abdominal discomfort. She had 5 episodes non-bloody diarrhea on day one and took Immodium and Pepto Bismal. She had no further bowel movements until today and had 2 episodes of diarrhea today. No fever, vomiting, urinary symptoms, back pain or myalgias. No known sick contacts. She has a history of lung cancer with remote treatments including right upper lobectomy and radiation, none recently.    Diarrhea   Associated symptoms include abdominal pain. Pertinent negatives include no vomiting, no chills and no myalgias.    Past Medical History:  Diagnosis Date  . Arthritis    back.  . Cervical ca ([place]) dx'd 1988   surg only  . Complication of anesthesia    difficulty awakening, dysrhythmia post surgery, (prolonged sedation level)  . Headache    occ. sinus type headaches  . Hx of radiation therapy 09/16/13-09/30/13   prevascular lymph node  . Hypertension   . Hyperthyroidism    dr Loanne Drilling- radioactive iodine treatment 2011- resolved  . LBBB (left bundle branch block)   . lung ca dx'd 07/2008   lung- right non small cell, adenocarcinoma w/ broncho- alveolar features  . Lung cancer ([place])    Bilaterally, RUL lobectomy and spot involving both lungs- "cancer"  . Macular degeneration    bilateral  . Melanoma ([place])    left leg  . Radiation 10/11/11-10/21/11   Left upper lobe adenocarcinoma  . Tremor    hand- intention tremor    Patient Active Problem List   Diagnosis Date Noted  . Hyperlipidemia 08/19/2015  . Cancer of upper lobe of left lung ([place])   . Recurrent lung adenocarcinoma ([place]) 10/01/2014  . Local recurrence of left lung cancer ([place])   . Carotid artery disease without cerebral infarction ([place])  05/20/2013  . Atherosclerosis of aorta ([place]) 05/10/2013  . Macular degeneration 05/10/2013  . Allergic rhinitis 10/20/2012  . Cancer of upper lobe of lung ([place]) 09/28/2011  . Melanoma ([place])   . LBBB (left bundle branch block)   . Gastritis due to nonsteroidal anti-inflammatory drug 06/20/2011  . Hyperthyroidism   . Tremor   . Abdominal pain, RUQ 02/10/2011  . GERD with stricture 02/08/2011  . GOITER, MULTINODULAR 09/25/2009  . URI 09/02/2009  . ESSENTIAL HYPERTENSION, BENIGN 12/01/2008    Past Surgical History:  Procedure Laterality Date  . CATARACT EXTRACTION, BILATERAL Bilateral   . conization for cervical dysplasia    . esophogus stretching    . rul lobectomy  2010  . TONSILLECTOMY      OB History    No data available       Home Medications    Prior to Admission medications   Medication Sig Start Date End Date Taking? Authorizing Provider  acetaminophen (TYLENOL) 650 MG CR tablet Take 650 mg by mouth every 8 (eight) hours as needed for pain.    Historical Provider, MD  Aspirin-Salicylamide-Caffeine (BC HEADACHE POWDER PO) Take 1 Package by mouth daily as needed.    Historical Provider, MD  atenolol (TENORMIN) 25 MG tablet Take 1 tablet (25 mg total) by mouth 2 (two) times daily. 08/19/15   Dorothy Spark, MD  calcium-vitamin D (OSCAL WITH D) 500-200 MG-UNIT  per tablet Take 1 tablet by mouth every morning.     Historical Provider, MD  diltiazem (DILT-XR) 240 MG 24 hr capsule Take 1 capsule (240 mg total) by mouth daily. 08/19/15   Dorothy Spark, MD  esomeprazole (NEXIUM) 20 MG capsule Take by mouth.    Historical Provider, MD  etodolac (LODINE) 400 MG tablet Take by mouth.    Historical Provider, MD  famotidine (PEPCID) 20 MG tablet Take by mouth.    Historical Provider, MD  fluticasone (FLONASE) 50 MCG/ACT nasal spray Place 1 spray into both nostrils daily as needed for allergies or rhinitis.    Historical Provider, MD  gabapentin (NEURONTIN) 300 MG capsule Take 300  mg by mouth at bedtime. States she takes 2 hs 07/31/14   Historical Provider, MD  hydrochlorothiazide (HYDRODIURIL) 25 MG tablet Take 1 tablet (25 mg total) by mouth every morning. 08/19/15   Dorothy Spark, MD  Multiple Vitamin (MULITIVITAMIN WITH MINERALS) TABS Take 1 tablet by mouth every morning.     Historical Provider, MD  Multiple Vitamins-Minerals (ICAPS AREDS FORMULA PO) Take by mouth.    Historical Provider, MD  neomycin-polymyxin b-dexamethasone (MAXITROL) 3.5-10000-0.1 OINT  11/11/15   Historical Provider, MD  promethazine (PHENERGAN) 25 MG tablet TK 1 T PO Q 6 H PRN N FOR UP TO 7 DAYS 10/15/15   Historical Provider, MD  Red Yeast Rice 600 MG CAPS Take 1 capsule (600 mg total) by mouth daily. 08/20/15   Dorothy Spark, MD    Family History Family History  Problem Relation Age of Onset  . Asthma Mother   . Heart disease Mother   . Thyroid disease Daughter     hypothyroidism  . Cancer Maternal Uncle     lung  . Cancer Maternal Grandfather     lung  . Diabetes Neg Hx   . Coronary artery disease Neg Hx     Social History Social History  Substance Use Topics  . Smoking status: Former Smoker    Packs/day: 1.00    Years: 17.00    Types: Cigarettes    Quit date: 09/28/1975  . Smokeless tobacco: Never Used     Comment: 33 yrs ago  . Alcohol use Yes     Comment: glass wine daily     Allergies   Meperidine hcl; Penicillins; Morphine; Oxycodone-acetaminophen; Propoxyphene n-acetaminophen; and Tramadol hcl   Review of Systems Review of Systems  Constitutional: Negative for chills and fever.  HENT: Negative.   Respiratory: Negative.   Cardiovascular: Negative.   Gastrointestinal: Positive for abdominal pain, diarrhea and nausea. Negative for abdominal distention, blood in stool and vomiting.  Genitourinary: Negative.  Negative for dysuria and frequency.  Musculoskeletal: Negative.  Negative for back pain and myalgias.  Skin: Negative.   Neurological: Negative.  Negative  for light-headedness.     Physical Exam Updated Vital Signs BP 174/94 (BP Location: Left Arm)   Pulse 98   Temp 98.3 F (36.8 C) (Oral)   Resp 18   Ht '5\' 5"'$  (1.651 m)   Wt 72.1 kg   SpO2 100%   BMI 26.46 kg/m   Physical Exam  Constitutional: She appears well-developed and well-nourished.  HENT:  Head: Normocephalic.  Neck: Normal range of motion. Neck supple.  Cardiovascular: Normal rate and regular rhythm.   Pulmonary/Chest: Effort normal and breath sounds normal.  Abdominal: Soft. Bowel sounds are normal. There is tenderness (Mild lower abdominal tenderness. ). There is no rebound and no guarding.  Musculoskeletal:  Normal range of motion.  Neurological: She is alert. No cranial nerve deficit.  Skin: Skin is warm and dry. No rash noted.  Psychiatric: She has a normal mood and affect.     ED Treatments / Results  Labs (all labs ordered are listed, but only abnormal results are displayed) Labs Reviewed  CBC WITH DIFFERENTIAL/PLATELET - Abnormal; Notable for the following:       Result Value   WBC 18.6 (*)    Hemoglobin 15.1 (*)    HCT 46.6 (*)    Neutro Abs 14.1 (*)    Monocytes Absolute 1.9 (*)    All other components within normal limits  COMPREHENSIVE METABOLIC PANEL - Abnormal; Notable for the following:    Sodium 133 (*)    Chloride 98 (*)    CO2 21 (*)    Glucose, Bld 140 (*)    BUN 22 (*)    Creatinine, Ser 1.24 (*)    GFR calc non Af Amer 41 (*)    GFR calc Af Amer 48 (*)    All other components within normal limits  URINALYSIS, ROUTINE W REFLEX MICROSCOPIC (NOT AT [facility])    EKG  EKG Interpretation None       Radiology No results found.  Procedures Procedures (including critical care time)  Medications Ordered in ED Medications - No data to display   Initial Impression / Assessment and Plan / ED Course  I have reviewed the triage vital signs and the nursing notes.  Pertinent labs & imaging results that were available during my care of  the patient were reviewed by me and considered in my medical decision making (see chart for details).  Clinical Course  Value Comment By Time  ED EKG (Reviewed) Charlesetta Shanks, MD 08/27 2045    Patient presents with lower abdominal discomfort, diarrhea and nausea without vomiting for 3 days. No fever. She is found to have moderate leukocytosis and a CT scan is performed without significant finding. Patient is feeling better with Reglan and mild rehydration.   She has been ambulatory to and from the bathroom. She is examined by Dr. Thomasene Lot and found stable for discharge home. Will send a collection cup home for stool sample collection if diarrhea continues for evaluation by her primary care. Return precautions discussed.  Final Clinical Impressions(s) / ED Diagnoses   Final diagnoses:  None    New Prescriptions New Prescriptions   No medications on file     Charlann Lange, PA-C 03/07/16 0309    [place], MD 03/07/16 [phone]

## 2016-03-07 ENCOUNTER — Emergency Department (HOSPITAL_COMMUNITY): Payer: Medicare Other

## 2016-03-07 DIAGNOSIS — R1030 Lower abdominal pain, unspecified: Secondary | ICD-10-CM | POA: Diagnosis not present

## 2016-03-07 DIAGNOSIS — R197 Diarrhea, unspecified: Secondary | ICD-10-CM | POA: Diagnosis not present

## 2016-03-07 LAB — URINALYSIS, ROUTINE W REFLEX MICROSCOPIC
GLUCOSE, UA: NEGATIVE mg/dL
Hgb urine dipstick: NEGATIVE
KETONES UR: 15 mg/dL — AB
LEUKOCYTES UA: NEGATIVE
NITRITE: NEGATIVE
PROTEIN: NEGATIVE mg/dL
Specific Gravity, Urine: 1.039 — ABNORMAL HIGH (ref 1.005–1.030)
pH: 5.5 (ref 5.0–8.0)

## 2016-03-07 LAB — LACTIC ACID, PLASMA: Lactic Acid, Venous: 1.1 mmol/L (ref 0.5–1.9)

## 2016-03-07 MED ORDER — METOCLOPRAMIDE HCL 10 MG PO TABS: 10.0000 mg | ORAL_TABLET | Freq: Four times a day (QID) | ORAL | 0 refills | Status: DC

## 2016-03-07 MED ORDER — METOCLOPRAMIDE HCL 5 MG/ML IJ SOLN
10.0000 mg | Freq: Once | INTRAMUSCULAR | Status: AC
  Administered 2016-03-07: 10 mg via INTRAVENOUS
  Filled 2016-03-07: qty 2

## 2016-03-07 NOTE — ED Notes (Signed)
Pt in ct 

## 2016-03-07 NOTE — Discharge Instructions (Signed)
FOLLOW UP WITH YOUR DOCTOR FOR RECHECK AND TAKE ANY STOOL COLLECTED IF DIARRHEA CONTINUES FOR TESTING. RETURN TO THE EMERGENCY DEPARTMENT WITH ANY WORSENING OR CONCERNING SYMPTOMS - FEVER, INCREASED PAIN, BLOOD IN STOOLS, UNCONTROLLED VOMITING.

## 2016-03-07 NOTE — ED Notes (Signed)
The pt has had 580m of nss and more med  Feeling a little better but not top notch

## 2016-03-10 LAB — URINE CULTURE: Culture: 50000 — AB

## 2016-03-11 ENCOUNTER — Ambulatory Visit (INDEPENDENT_AMBULATORY_CARE_PROVIDER_SITE_OTHER): Payer: Medicare Other | Admitting: Cardiology

## 2016-03-11 ENCOUNTER — Encounter: Payer: Self-pay | Admitting: Cardiology

## 2016-03-11 VITALS — BP 162/90 | HR 68 | Ht 65.0 in | Wt 148.0 lb

## 2016-03-11 DIAGNOSIS — I779 Disorder of arteries and arterioles, unspecified: Secondary | ICD-10-CM | POA: Diagnosis not present

## 2016-03-11 DIAGNOSIS — R0602 Shortness of breath: Secondary | ICD-10-CM | POA: Diagnosis not present

## 2016-03-11 DIAGNOSIS — I447 Left bundle-branch block, unspecified: Secondary | ICD-10-CM

## 2016-03-11 DIAGNOSIS — E785 Hyperlipidemia, unspecified: Secondary | ICD-10-CM

## 2016-03-11 DIAGNOSIS — R110 Nausea: Secondary | ICD-10-CM | POA: Diagnosis not present

## 2016-03-11 DIAGNOSIS — I10 Essential (primary) hypertension: Secondary | ICD-10-CM

## 2016-03-11 DIAGNOSIS — N390 Urinary tract infection, site not specified: Secondary | ICD-10-CM

## 2016-03-11 DIAGNOSIS — I119 Hypertensive heart disease without heart failure: Secondary | ICD-10-CM

## 2016-03-11 DIAGNOSIS — I739 Peripheral vascular disease, unspecified: Secondary | ICD-10-CM

## 2016-03-11 DIAGNOSIS — R0600 Dyspnea, unspecified: Secondary | ICD-10-CM

## 2016-03-11 DIAGNOSIS — I2510 Atherosclerotic heart disease of native coronary artery without angina pectoris: Secondary | ICD-10-CM | POA: Diagnosis not present

## 2016-03-11 DIAGNOSIS — I2583 Coronary atherosclerosis due to lipid rich plaque: Secondary | ICD-10-CM

## 2016-03-11 DIAGNOSIS — R0609 Other forms of dyspnea: Secondary | ICD-10-CM

## 2016-03-11 LAB — CBC WITH DIFFERENTIAL/PLATELET
Basophils Absolute: 0 cells/uL (ref 0–200)
Basophils Relative: 0 %
Eosinophils Absolute: 150 cells/uL (ref 15–500)
Eosinophils Relative: 1 %
HCT: 42.3 % (ref 35.0–45.0)
Hemoglobin: 13.9 g/dL (ref 11.7–15.5)
Lymphocytes Relative: 12 %
Lymphs Abs: 1800 cells/uL (ref 850–3900)
MCH: 30.6 pg (ref 27.0–33.0)
MCHC: 32.9 g/dL (ref 32.0–36.0)
MCV: 93.2 fL (ref 80.0–100.0)
MPV: 11.4 fL (ref 7.5–12.5)
Monocytes Absolute: 2400 cells/uL — ABNORMAL HIGH (ref 200–950)
Monocytes Relative: 16 %
Neutro Abs: 10650 cells/uL — ABNORMAL HIGH (ref 1500–7800)
Neutrophils Relative %: 71 %
Platelets: 330 10*3/uL (ref 140–400)
RBC: 4.54 MIL/uL (ref 3.80–5.10)
RDW: 12.6 % (ref 11.0–15.0)
WBC: 15 10*3/uL — ABNORMAL HIGH (ref 3.8–10.8)

## 2016-03-11 LAB — TSH: TSH: 28.59 mIU/L — ABNORMAL HIGH

## 2016-03-11 LAB — BASIC METABOLIC PANEL
BUN: 28 mg/dL — ABNORMAL HIGH (ref 7–25)
CO2: 24 mmol/L (ref 20–31)
Calcium: 10.1 mg/dL (ref 8.6–10.4)
Chloride: 99 mmol/L (ref 98–110)
Creat: 1.56 mg/dL — ABNORMAL HIGH (ref 0.60–0.93)
Glucose, Bld: 96 mg/dL (ref 65–99)
Potassium: 3.6 mmol/L (ref 3.5–5.3)
Sodium: 138 mmol/L (ref 135–146)

## 2016-03-11 LAB — LIPID PANEL
Cholesterol: 230 mg/dL — ABNORMAL HIGH (ref 125–200)
HDL: 54 mg/dL (ref >=46)
LDL Cholesterol: 147 mg/dL — ABNORMAL HIGH (ref <130)
Total CHOL/HDL Ratio: 4.3 Ratio (ref <=5.0)
Triglycerides: 143 mg/dL (ref <150)
VLDL: 29 mg/dL (ref <30)

## 2016-03-11 LAB — MAGNESIUM: Magnesium: 2 mg/dL (ref 1.5–2.5)

## 2016-03-11 MED ORDER — AMLODIPINE BESYLATE 2.5 MG PO TABS: 2.5000 mg | ORAL_TABLET | Freq: Every day | ORAL | 3 refills | Status: DC

## 2016-03-11 MED ORDER — CIPROFLOXACIN HCL 500 MG PO TABS: 500.0000 mg | ORAL_TABLET | Freq: Two times a day (BID) | ORAL | 0 refills | Status: DC

## 2016-03-11 NOTE — Progress Notes (Signed)
ED Antimicrobial Stewardship Positive Culture Follow Up   [name] is an 75 y.o. female who presented to [facility] on 03/06/2016 with a chief complaint of  Chief Complaint  Patient presents with  . Nausea  . Diarrhea    Recent Results (from the past 720 hour(s))  Urine culture     Status: Abnormal   Collection Time: 03/07/16  1:35 AM  Result Value Ref Range Status   Specimen Description URINE, CLEAN CATCH  Final   Special Requests NONE  Final   Culture (A)  Final    50,000 COLONIES/mL ENTEROCOCCUS FAECALIS 50,000 COLONIES/mL ESCHERICHIA COLI    Report Status 03/10/2016 FINAL  Final   Organism ID, Bacteria ENTEROCOCCUS FAECALIS (A)  Final   Organism ID, Bacteria ESCHERICHIA COLI (A)  Final      Susceptibility   Escherichia coli - MIC*    AMPICILLIN 4 SENSITIVE Sensitive     CEFAZOLIN <=4 SENSITIVE Sensitive     CEFTRIAXONE <=1 SENSITIVE Sensitive     CIPROFLOXACIN <=0.25 SENSITIVE Sensitive     GENTAMICIN <=1 SENSITIVE Sensitive     IMIPENEM <=0.25 SENSITIVE Sensitive     NITROFURANTOIN <=16 SENSITIVE Sensitive     TRIMETH/SULFA <=20 SENSITIVE Sensitive     AMPICILLIN/SULBACTAM <=2 SENSITIVE Sensitive     PIP/TAZO <=4 SENSITIVE Sensitive     Extended ESBL NEGATIVE Sensitive     * 50,000 COLONIES/mL ESCHERICHIA COLI   Enterococcus faecalis - MIC*    AMPICILLIN <=2 SENSITIVE Sensitive     LEVOFLOXACIN 2 SENSITIVE Sensitive     NITROFURANTOIN <=16 SENSITIVE Sensitive     VANCOMYCIN 2 SENSITIVE Sensitive     * 50,000 COLONIES/mL ENTEROCOCCUS FAECALIS    Presented with nausea and diarrhea.  No urinary symptoms reported. UA negative.  With two organisms and <100K colonies of each this represents asymptomatic bacteruria and no treatment is recommended.   Norva Riffle 03/11/2016, 8:54 AM Infectious Diseases Pharmacist Phone# [phone]

## 2016-03-11 NOTE — Progress Notes (Signed)
Patient ID: [name], female   DOB: 03-01-1941, 75 y.o.   MRN: [id number]       Cardiology Office Note  Date:  03/11/2016   ID:  [name], DOB Mar 04, 1941, MRN [id number]  PCP:  No PCP Per Patient  Cardiologist:  Ena Dawley, MD   Chief complaint: One-year follow-up   History of Present Illness: [name] is a very pleasant 75 y.o. female who presents for one-year follow-up, previously followed by Dr. Ron Parker. ("This is a special patient of mine that I have followed many years. Over time we have shared family stories about our children. She is an intelligent, perceptive woman").  Patient is seen to follow-up hypertension and a history of left bundle branch block that has been diagnosed or 20 years ago, she underwent cardiac catheterization in the 80s that showed no significant coronary artery disease. She has a slow-growing lung cancer. Unfortunately it is continuing to grow and cause her difficulties.The most recent PET scan from last month shows worsening of lesion in her lungs and new and adrenal glands.  She is going to see her oncologist in May to see if she needs chemotherapy.  She has been through a lot of stress lately with her husband having kidney cancer and undergoing surgery.  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  She denies any chest pain, she has stable dyspnea on exertion, no paroxysmal nocturnal dyspnea orthopnea or lower extremity edema. She has been offered statins in the past but because of significant side effects that she usually experiences with medicines and she decided not to take it. Her carotid ultrasound in 2014 showed minimal plaque in 1-39% range bilaterally. Her chest CAT scan performed as part of  the follow-up scan for her cancer showed calcium in her left anterior descending artery.  03/11/2016 - the patient is coming after 6 months, she has been experiencing nausea and diarrhea, went to the ER, thought to be an acute gastroenteritis, however the diarrhea has resolved and she developed frequent urination with burning, fever and chills. She feels dizzy, she has had elevated BP. She is under a lot of stress as her husband has metastatic kidney cancer to the brain. No chest pain, no LE edema, no orthopnea. She has noticed worsening DOE.  Past Medical History:  Diagnosis Date  . Arthritis    back.  . Cervical ca ([place]) dx'd 1988   surg only  . Complication of anesthesia    difficulty awakening, dysrhythmia post surgery, (prolonged sedation level)  . Headache    occ. sinus type headaches  . Hx of radiation therapy 09/16/13-09/30/13   prevascular lymph node  . Hypertension   . Hyperthyroidism    dr Loanne Drilling- radioactive iodine treatment 2011- resolved  . LBBB (left bundle branch block)   . lung ca dx'd 07/2008   lung- right non small cell, adenocarcinoma w/ broncho- alveolar features  . Lung cancer ([place])    Bilaterally, RUL lobectomy and spot involving both lungs- "cancer"  . Macular degeneration    bilateral  . Melanoma ([place])    left leg  . Radiation 10/11/11-10/21/11   Left upper lobe adenocarcinoma  . Tremor    hand- intention tremor    Past Surgical History:  Procedure Laterality Date  . CATARACT EXTRACTION, BILATERAL Bilateral   . conization for cervical dysplasia    . esophogus stretching    . rul lobectomy  2010  . TONSILLECTOMY      Current Outpatient Prescriptions  Medication Sig Dispense Refill  . acetaminophen (TYLENOL) 650 MG CR tablet Take 650 mg by mouth every 8 (eight) hours as needed for pain.    Marland Kitchen atenolol (TENORMIN) 25 MG tablet Take 1 tablet (25 mg total) by mouth 2 (two) times daily. 180 tablet 11  . calcium-vitamin D (OSCAL WITH D) 500-200 MG-UNIT per  tablet Take 1 tablet by mouth every morning.     . diltiazem (DILT-XR) 240 MG 24 hr capsule Take 1 capsule (240 mg total) by mouth daily. 90 capsule 11  . etodolac (LODINE) 400 MG tablet Take 400 mg by mouth 2 (two) times daily.    . famotidine (PEPCID) 20 MG tablet Take 20 mg by mouth 2 (two) times daily.     Marland Kitchen gabapentin (NEURONTIN) 300 MG capsule Take 300 mg by mouth at bedtime. States she takes 2 hs  2  . hydrochlorothiazide (HYDRODIURIL) 25 MG tablet Take 1 tablet (25 mg total) by mouth every morning. 90 tablet 11  . metoCLOPramide (REGLAN) 10 MG tablet Take 1 tablet (10 mg total) by mouth every 6 (six) hours. 30 tablet 0  . Multiple Vitamin (MULITIVITAMIN WITH MINERALS) TABS Take 1 tablet by mouth every morning.      No current facility-administered medications for this visit.     Allergies:   Meperidine hcl; Penicillins; Morphine; Oxycodone-acetaminophen; Propoxyphene n-acetaminophen; and Tramadol hcl    Social History:  The patient  reports that she quit smoking about 40 years ago. Her smoking use included Cigarettes. She has a 17.00 pack-year smoking history. She has never used smokeless tobacco. She reports that she drinks alcohol. She reports that she does not use drugs.   Family History:  The patient's family history includes Asthma in her mother; Cancer in her maternal grandfather and maternal uncle; Heart disease in her mother; Thyroid disease in her daughter.   ROS:  Please see the history of present illness.   Otherwise, review of systems are positive for none.   All other systems are reviewed and negative.   PHYSICAL EXAM: VS:  BP (!) 162/90   Pulse 68   Ht '5\' 5"'$  (1.651 m)   Wt 148 lb (67.1 kg)   BMI 24.63 kg/m  , BMI Body mass index is 24.63 kg/m. GEN: Well nourished, well developed, in no acute distress  HEENT: normal  Neck: no JVD, carotid bruits, or masses Cardiac: RRR; no murmurs, rubs, or gallops,no edema  Respiratory:  clear to auscultation bilaterally, normal  work of breathing GI: soft, nontender, nondistended, + BS MS: no deformity or atrophy  Skin: warm and dry, no rash Neuro:  Strength and sensation are intact Psych: euthymic mood, full affect  EKG:  EKG is ordered today. The ekg ordered today demonstrates SR, LBBB  Recent Labs: 03/06/2016: ALT 15; BUN 22; Creatinine, Ser 1.24; Hemoglobin 15.1; Platelets 329; Potassium 3.5; Sodium 133   Lipid Panel    Component Value Date/Time   CHOL 210 (H) 08/19/2015 1154   TRIG 170 (H) 08/19/2015 1154   HDL 46 08/19/2015 1154   CHOLHDL 4.6 08/19/2015 1154   VLDL 34 (H) 08/19/2015 1154   LDLCALC 130 (H) 08/19/2015 1154   LDLDIRECT 137.6 05/17/2013 0937   Wt Readings from Last 3 Encounters:  03/11/16  148 lb (67.1 kg)  03/06/16 159 lb (72.1 kg)  02/24/16 159 lb 9.6 oz (72.4 kg)      ASSESSMENT AND PLAN:  1. UTI - with fever chills, nausea, leukocytosis, possible pyelonephritis, I will start ciprofloxacin 500 mg po BID x 5 days, Crea elevated today, advised to increase fluid intake.  Follow up in 2 weeks with CMP, CBC.   2. Hypertensive heart disease without CHF - add amlodipine 2.5, readjust at the next visit as needed  3. DOE - if her symptoms don't improve with treatment of UTI and controlling HTN consider a stress test at the next visit.  4. CAD - nonobstructive in the past, now evidence of calcium or her LAD, we will consider an ischemic workup at the next visit.  5. Hyperlipidemia - stopped using red yeast rice, repeat LDL 127, we will rediscuss statins, she really needs to take it considering that she has evidence of CAD on chest CT.  6. Carotid disease - minimal plaque ultrasound in 2014 with no new symptoms no need to repeat at this point.  7. Left bundle branch block - chronic and unchanged.  Labs/ tests ordered today include:  Orders Placed This Encounter  Procedures  . Lipid Profile  . EKG 12-Lead    Follow-up in 2 weeks, repeat CMP at that time.   Signed, Ena Dawley, MD  03/11/2016 8:21 AM    [facility] [place], [place], [place]  [address] Phone: [phone]; Fax: [phone]

## 2016-03-11 NOTE — Patient Instructions (Signed)
Medication Instructions:   START TAKING AMLODIPINE 2.5 MG ONCE DAILY  START TAKING CIPROFLOXACIN 500 MG TWICE DAILY FOR 5 DAYS ONLY    Labwork:  TODAY--BMET, CBC W DIFF, TSH, MAGNESIUM LEVEL, LIPIDS, AND URINALYSIS     Follow-Up:  2 WEEKS WITH AN EXTENDER IN OUR OFFICE       If you need a refill on your cardiac medications before your next appointment, please call your pharmacy.

## 2016-03-15 ENCOUNTER — Telehealth: Payer: Self-pay | Admitting: *Deleted

## 2016-03-15 DIAGNOSIS — E039 Hypothyroidism, unspecified: Secondary | ICD-10-CM

## 2016-03-15 MED ORDER — LEVOTHYROXINE SODIUM 100 MCG PO TABS: 100.0000 ug | ORAL_TABLET | Freq: Every day | ORAL | 2 refills | Status: DC

## 2016-03-15 NOTE — Telephone Encounter (Signed)
Notes Recorded by Dorothy Spark, MD on 03/11/2016 at 7:10 PM EDT Elevated crea, please advise to drink plenty of fluids, she has elevated LDL again and should be started on a statin, but that can be discussed at the next visit. Elevated WBC consistent with an ongoing infection - UTI.  Notes Recorded by Dorothy Spark, MD on 03/13/2016 at 5:30 PM EDT Her TSH is high consistent with hypothyroidism, she needs to be started on Synthroid 100 mcg and have fT3, fT4 and TSH checked in 4 weeks.

## 2016-03-15 NOTE — Telephone Encounter (Signed)
Notified the pt of her lab results per Dr Meda Coffee, and recommendation for the pt to start Synthroid 100 mcg po daily, for hypothyroidism, and recheck a TSH, free T3 and free T4 in 4 weeks.  Confirmed the pharmacy of choice with the pt.  Scheduled the pts lab appt in 4 weeks for 04/12/16 to check tsh, free t3, and free t4.    Also informed the pt that her LDL is high, and [facility], will discuss this more in detail, with a treatment plan at her next OV on 03/25/16.   Informed the pt that her UA was consistent with a UTI, for her WBC was elevated.  Informed the pt that per Dr Meda Coffee, she recommends that she continue her antibiotic as prescribed at her last OV, and drink plenty of fluids, for her creatinine was elevated.   Pt verbalized understanding and agrees with this plan.

## 2016-03-24 ENCOUNTER — Encounter: Payer: Self-pay | Admitting: Physician Assistant

## 2016-03-24 DIAGNOSIS — N189 Chronic kidney disease, unspecified: Secondary | ICD-10-CM

## 2016-03-24 DIAGNOSIS — I10 Essential (primary) hypertension: Secondary | ICD-10-CM | POA: Insufficient documentation

## 2016-03-24 DIAGNOSIS — I2510 Atherosclerotic heart disease of native coronary artery without angina pectoris: Secondary | ICD-10-CM | POA: Insufficient documentation

## 2016-03-24 DIAGNOSIS — N183 Chronic kidney disease, stage 3 unspecified: Secondary | ICD-10-CM | POA: Insufficient documentation

## 2016-03-24 DIAGNOSIS — N179 Acute kidney failure, unspecified: Secondary | ICD-10-CM | POA: Insufficient documentation

## 2016-03-24 NOTE — Progress Notes (Signed)
Cardiology Office Note    Date:  03/25/2016  ID:  [name], DOB February 09, 1941, MRN [id number] PCP:  [facility] Cardiologist:  Meda Coffee  Chief Complaint: f/u SOB, HTN  History of Present Illness:  [name] is a 75 y.o. female with history of LBBB, nonsmall cell lung cancer (s/p RULectomy 2009, radiation to LUL nodule 2013, radiation to lymphadenopathy 2015, Tarceva rx, thermal ablation of recurrent LUL adenocarcinoma 2016), arthritis, cervical CA 1988 s/p surgery, HTN, hypothyroidism, macular degeneration, melanoma of leg, intention tremor, probable CKD stage III per labs who presents for f/u. Previously followed by Dr. Ron Parker - underwent cardiac catheterization in the 80s that showed no significant coronary artery disease. Carotid US 2014 showed 1-39% stenosis. Husband has metastatic kidney cancer. Seen by Dr. Meda Coffee 03/11/16 for symptoms of UTI/possible pyelo, started on Ciprofloxacin. Amlodipine was added for hypertension. She was also complaining of DOE - Dr. Meda Coffee recommended that if if her symptoms don't improve with treatment of UTI and controlling HTN, would consider a stress test at the next visit. A CT of the chest showed calcium in her LAD. Labs 03/11/16: LDL 147, TSH 28, WBC 15, Mg 2.0, BUN 28/Cr 1.56 (previously 1.3-1.4). Was started on levothyroxine 15mg daily with plans to recheck free hormones and TSH in 4 weeks.   She presents back for follow-up feeling 100% better - nausea and fatigue are gone. Her breathing is back to baseline. No fevers, chills, or chest pain. Today is her 54th wedding anniversary. She has plans to f/u with PCP to establish care next week.   Past Medical History:  Diagnosis Date  . Arthritis    back.  . Cervical ca ([place] dx[place] 1988   surg only  . CKD (chronic kidney disease), stage III   . Complication of anesthesia    difficulty awakening, dysrhythmia post surgery, (prolonged sedation level)  . Coronary artery calcification seen on CT scan   .  Headache    occ. sinus type headaches  . Hx of radiation therapy 09/16/13-09/30/13   prevascular lymph node  . Hyperlipidemia   . Hypertension   . Hyperthyroidism    dr ELoanne Drilling radioactive iodine treatment 2011- resolved  . LBBB (left bundle branch block)   . Lung cancer ([facility]    Bilaterally, RUL lobectomy and spot involving both lungs- "cancer"  . Macular degeneration    bilateral  . [place]    [place]  . Radiation 10/11/11-10/21/11   Left upper lobe adenocarcinoma  . Tremor    hand- intention tremor    Past Surgical History:  Procedure Laterality Date  . CATARACT EXTRACTION, BILATERAL Bilateral   . conization for cervical dysplasia    . esophogus stretching    . rul lobectomy  2010  . TONSILLECTOMY      Current Medications: Current Outpatient Prescriptions  Medication Sig Dispense Refill  . acetaminophen (TYLENOL) 650 MG CR tablet Take 650 mg by mouth every 8 (eight) hours as needed for pain.    .Marland KitchenamLODipine (NORVASC) 2.5 MG tablet Take 1 tablet (2.5 mg total) by mouth daily. 90 tablet 3  . atenolol (TENORMIN) 25 MG tablet Take 1 tablet (25 mg total) by mouth 2 (two) times daily. 180 tablet 11  . calcium-vitamin D (OSCAL WITH D) 500-200 MG-UNIT per tablet Take 1 tablet by mouth every morning.     . diltiazem (DILT-XR) 240 MG 24 hr capsule Take 1 capsule (240 mg total) by mouth daily. 90 capsule 11  . etodolac (  LODINE) 400 MG tablet Take 400 mg by mouth 2 (two) times daily.    . famotidine (PEPCID) 20 MG tablet Take 20 mg by mouth 2 (two) times daily.     Marland Kitchen gabapentin (NEURONTIN) 300 MG capsule Take 300 mg by mouth at bedtime. States she takes 2 hs  2  . hydrochlorothiazide (HYDRODIURIL) 25 MG tablet Take 1 tablet (25 mg total) by mouth every morning. 90 tablet 11  . levothyroxine (SYNTHROID) 100 MCG tablet Take 1 tablet (100 mcg total) by mouth daily before breakfast. 30 tablet 2  . Multiple Vitamin (MULITIVITAMIN WITH MINERALS) TABS Take 1 tablet by mouth every  morning.      No current facility-administered medications for this visit.      Allergies:   Meperidine hcl; Penicillins; Morphine; Oxycodone-acetaminophen; Propoxyphene n-acetaminophen; and Tramadol hcl   Social History   Social History  . Marital status: Married    Spouse name: N/A  . Number of children: 2  . Years of education: N/A   Occupational History  . homemaker Retired   Social History Main Topics  . Smoking status: Former Smoker    Packs/day: 1.00    Years: 17.00    Types: Cigarettes    Quit date: 09/28/1975  . Smokeless tobacco: Never Used     Comment: 33 yrs ago  . Alcohol use Yes     Comment: glass wine daily  . Drug use: No  . Sexual activity: Not Asked   Other Topics Concern  . None   Social History Personal assistant   Married 1963   4 grandchildren and 2 step-grandchildren   Marriage in good health            Physician Roster:   Oncologist- Dr Julien Nordmann   Surgeon- Dr Lindell Spar- Dr Mable Fill- Dr Allyson Sabal     Family History:  The patient's family history includes Asthma in her mother; Cancer in her maternal grandfather and maternal uncle; Heart disease in her mother; Thyroid disease in her daughter.   ROS:   Please see the history of present illness. No LEE, syncope. All other systems are reviewed and otherwise negative.    PHYSICAL EXAM:   VS:  BP (!) 148/73   Pulse 67   Ht '5\' 5"'$  (1.651 m)   Wt 154 lb 12.8 oz (70.2 kg)   BMI 25.76 kg/m   BMI: Body mass index is 25.76 kg/m. GEN: Well nourished, well developed WF, in no acute distress  HEENT: normocephalic, atraumatic Neck: no JVD, carotid bruits, or masses Cardiac: RRR; no murmurs, rubs, or gallops, no edema  Respiratory:  clear to auscultation bilaterally, normal work of breathing GI: soft, nontender, nondistended, + BS MS: no deformity or atrophy  Skin: warm and dry, no rash Neuro:  Alert and Oriented x 3, Strength and sensation are intact, follows commands Psych:  euthymic mood, full affect  Wt Readings from Last 3 Encounters:  03/25/16 154 lb 12.8 oz (70.2 kg)  03/11/16 148 lb (67.1 kg)  03/06/16 159 lb (72.1 kg)      Studies/Labs Reviewed:   EKG:  EKG was ordered today and personally reviewed by me and demonstrates NSR LBBB, LAD  Recent Labs: 03/06/2016: ALT 15 03/11/2016: BUN 28; Creat 1.56; Hemoglobin 13.9; Magnesium 2.0; Platelets 330; Potassium 3.6; Sodium 138; TSH 28.59   Lipid Panel    Component Value Date/Time   CHOL 230 (H) 03/11/2016 0907   TRIG 143 03/11/2016 0907  HDL 54 03/11/2016 0907   CHOLHDL 4.3 03/11/2016 0907   VLDL 29 03/11/2016 0907   LDLCALC 147 (H) 03/11/2016 0907   LDLDIRECT 137.6 05/17/2013 0937    Additional studies/ records that were reviewed today include: Summarized above.    ASSESSMENT & PLAN:   1. HTN - initial BP 148/73, recheck by me 172/72. She brings in a log of BPs, all of which recently range 762-831 systolic since beginning amlodipine. She states she has been sensitive to medicines in the past. I suspect she will ultimately need '10mg'$  of amlodipine daily. She wishes to start with initial increase to 2.'5mg'$  BID (she takes her BB split up into AM/PM and prefers it to be split up during the day). I asked her to monitor BP at home and call if still running >140/90. 2. Coronary calcium seen on CT - no recent symptoms convincing of angina. Dyspnea back to baseline. Observe for worsening sx. Add aspirin '81mg'$  daily. 3. Hyperlipidemia - she is wary of statin therapy due to seeing side effects her husband experienced, but is amenable to trying a low dose. Start atorvastatin '10mg'$  daily with repeat lipids/LFTs when she returns for f/u thyroid hormones. 4. AKI on probable CKD stage III - recheck BMET today. Suspect underlying CKD which she was aware of. She is on etodolac - I asked her to make sure the prescribing doctor is aware her renal function is borderline. 5. Hypothyroidism - Dr. Meda Coffee has labs in the  system for October to recheck.  Disposition: F/u with Dr. Meda Coffee in 3 months.   Medication Adjustments/Labs and Tests Ordered: Current medicines are reviewed at length with the patient today.  Concerns regarding medicines are outlined above. Medication changes, Labs and Tests ordered today are summarized above and listed in the Patient Instructions accessible in Encounters.   Raechel Ache PA-C  03/25/2016 10:43 AM    [address], [place], [place]  [address] Phone: [phone]; Fax: [phone]

## 2016-03-25 ENCOUNTER — Ambulatory Visit (INDEPENDENT_AMBULATORY_CARE_PROVIDER_SITE_OTHER): Payer: Medicare Other | Admitting: Physician Assistant

## 2016-03-25 ENCOUNTER — Encounter: Payer: Self-pay | Admitting: Physician Assistant

## 2016-03-25 VITALS — BP 172/72 | HR 67 | Ht 65.0 in | Wt 154.8 lb

## 2016-03-25 DIAGNOSIS — N179 Acute kidney failure, unspecified: Secondary | ICD-10-CM | POA: Diagnosis not present

## 2016-03-25 DIAGNOSIS — R110 Nausea: Secondary | ICD-10-CM

## 2016-03-25 DIAGNOSIS — E785 Hyperlipidemia, unspecified: Secondary | ICD-10-CM | POA: Diagnosis not present

## 2016-03-25 DIAGNOSIS — I739 Peripheral vascular disease, unspecified: Secondary | ICD-10-CM

## 2016-03-25 DIAGNOSIS — I10 Essential (primary) hypertension: Secondary | ICD-10-CM

## 2016-03-25 DIAGNOSIS — I2510 Atherosclerotic heart disease of native coronary artery without angina pectoris: Secondary | ICD-10-CM | POA: Diagnosis not present

## 2016-03-25 DIAGNOSIS — I779 Disorder of arteries and arterioles, unspecified: Secondary | ICD-10-CM

## 2016-03-25 DIAGNOSIS — R0602 Shortness of breath: Secondary | ICD-10-CM

## 2016-03-25 DIAGNOSIS — N189 Chronic kidney disease, unspecified: Secondary | ICD-10-CM

## 2016-03-25 DIAGNOSIS — E039 Hypothyroidism, unspecified: Secondary | ICD-10-CM

## 2016-03-25 LAB — BASIC METABOLIC PANEL
BUN: 26 mg/dL — ABNORMAL HIGH (ref 7–25)
CALCIUM: 9.5 mg/dL (ref 8.6–10.4)
CO2: 28 mmol/L (ref 20–31)
Chloride: 105 mmol/L (ref 98–110)
Creat: 1.51 mg/dL — ABNORMAL HIGH (ref 0.60–0.93)
GLUCOSE: 101 mg/dL — AB (ref 65–99)
Potassium: 3.8 mmol/L (ref 3.5–5.3)
SODIUM: 141 mmol/L (ref 135–146)

## 2016-03-25 MED ORDER — ATORVASTATIN CALCIUM 10 MG PO TABS: 10.0000 mg | ORAL_TABLET | Freq: Every day | ORAL | 3 refills | Status: DC

## 2016-03-25 MED ORDER — AMLODIPINE BESYLATE 2.5 MG PO TABS: 2.5000 mg | ORAL_TABLET | Freq: Two times a day (BID) | ORAL | 3 refills | Status: DC

## 2016-03-25 MED ORDER — ASPIRIN EC 81 MG PO TBEC: 81.0000 mg | DELAYED_RELEASE_TABLET | Freq: Every day | ORAL | 3 refills | Status: DC

## 2016-03-25 NOTE — Patient Instructions (Addendum)
Your physician has recommended you make the following change in your medication:  1.) INCREASE AMLODIPINE TO 2.5 MG TWO TIMES A DAY 2.) START ATORVASTATIN 10 MG EVERY EVENING 3.) START BABY ASPIRIN (81 MG) ENTERIC COATED  Your physician recommends that you return for lab work in: TODAY (BMET) , AND OCT 3 (LIVER, LIPID PANEL, ALONG WITH THYROID LABS)  Your physician recommends that you schedule a follow-up appointment in: 3 MONTHS WITH DR. Meda Coffee  CONTINUE TO KEEP TRACK OF YOUR BLOOD PRESSURE AT HOME. CALL IF RUNNING > 140 ON TOP OR >90 ON BOTTOM

## 2016-03-31 ENCOUNTER — Telehealth: Payer: Self-pay | Admitting: *Deleted

## 2016-03-31 MED ORDER — AMLODIPINE BESYLATE 10 MG PO TABS: 10.0000 mg | ORAL_TABLET | Freq: Every day | ORAL | 11 refills | Status: DC

## 2016-03-31 NOTE — Telephone Encounter (Signed)
Pt has been made aware of her lab results and the recommendation to stop HCTZ and increase the Amlodipine to 10 mg qd. Pt is already monitoring her bp at home and is comfortable in doing so and will call us if she runs higher than 140/90. Pt will finish up with Amlodipine 2.5 taking 4 tablets daily since she just paid for them. Pt verbalized understanding and is agreeable with this plan.

## 2016-03-31 NOTE — Telephone Encounter (Signed)
-----   Message from Charlie Pitter, [place] sent at 03/30/2016  1:26 PM EDT ----- Reviewed with Dr. Meda Coffee. Please stop HCTZ and increase amlodipine to '10mg'$  daily due to kidney function remaining higher than her baseline. Add BMET to thyroid labs coming up on October. Monitor BP at home and call if still running >140/90. F/u Dr. Meda Coffee 06/2016 as planned. Happy to see her earlier for a repeat BP check if she would like in 2-3 weeks. But if she feels comfortable monitoring at home she may do that also. Dayna Dunn PA-C

## 2016-04-12 ENCOUNTER — Other Ambulatory Visit: Payer: Medicare Other | Admitting: *Deleted

## 2016-04-12 DIAGNOSIS — E785 Hyperlipidemia, unspecified: Secondary | ICD-10-CM

## 2016-04-12 DIAGNOSIS — E039 Hypothyroidism, unspecified: Secondary | ICD-10-CM

## 2016-04-12 LAB — LIPID PANEL
CHOL/HDL RATIO: 4.5 ratio (ref <=5.0)
Cholesterol: 184 mg/dL (ref 125–200)
HDL: 41 mg/dL — ABNORMAL LOW (ref >=46)
LDL CALC: 104 mg/dL (ref <130)
Triglycerides: 193 mg/dL — ABNORMAL HIGH (ref <150)
VLDL: 39 mg/dL — AB (ref <30)

## 2016-04-12 LAB — T3, FREE: T3, Free: 2.4 pg/mL (ref 2.3–4.2)

## 2016-04-12 LAB — HEPATIC FUNCTION PANEL
ALT: 11 U/L (ref 6–29)
AST: 14 U/L (ref 10–35)
Albumin: 3.5 g/dL — ABNORMAL LOW (ref 3.6–5.1)
Alkaline Phosphatase: 67 U/L (ref 33–130)
BILIRUBIN DIRECT: 0.2 mg/dL (ref <=0.2)
BILIRUBIN TOTAL: 0.5 mg/dL (ref 0.2–1.2)
Indirect Bilirubin: 0.3 mg/dL (ref 0.2–1.2)
Total Protein: 5.6 g/dL — ABNORMAL LOW (ref 6.1–8.1)

## 2016-04-12 LAB — TSH: TSH: 1.23 mIU/L

## 2016-04-12 LAB — T4, FREE: Free T4: 1.4 ng/dL (ref 0.8–1.8)

## 2016-04-14 DIAGNOSIS — E7800 Pure hypercholesterolemia, unspecified: Secondary | ICD-10-CM | POA: Diagnosis not present

## 2016-04-14 DIAGNOSIS — M5136 Other intervertebral disc degeneration, lumbar region: Secondary | ICD-10-CM | POA: Diagnosis not present

## 2016-04-14 DIAGNOSIS — E039 Hypothyroidism, unspecified: Secondary | ICD-10-CM | POA: Diagnosis not present

## 2016-04-14 DIAGNOSIS — Z23 Encounter for immunization: Secondary | ICD-10-CM | POA: Diagnosis not present

## 2016-04-14 DIAGNOSIS — I10 Essential (primary) hypertension: Secondary | ICD-10-CM | POA: Diagnosis not present

## 2016-04-14 DIAGNOSIS — I700 Atherosclerosis of aorta: Secondary | ICD-10-CM | POA: Diagnosis not present

## 2016-04-14 DIAGNOSIS — N183 Chronic kidney disease, stage 3 (moderate): Secondary | ICD-10-CM | POA: Diagnosis not present

## 2016-04-14 DIAGNOSIS — J309 Allergic rhinitis, unspecified: Secondary | ICD-10-CM | POA: Diagnosis not present

## 2016-04-14 DIAGNOSIS — Z1389 Encounter for screening for other disorder: Secondary | ICD-10-CM | POA: Diagnosis not present

## 2016-04-14 DIAGNOSIS — C3491 Malignant neoplasm of unspecified part of right bronchus or lung: Secondary | ICD-10-CM | POA: Diagnosis not present

## 2016-04-14 DIAGNOSIS — K219 Gastro-esophageal reflux disease without esophagitis: Secondary | ICD-10-CM | POA: Diagnosis not present

## 2016-04-15 ENCOUNTER — Telehealth: Payer: Self-pay | Admitting: Cardiology

## 2016-04-15 DIAGNOSIS — R7989 Other specified abnormal findings of blood chemistry: Secondary | ICD-10-CM

## 2016-04-15 DIAGNOSIS — N189 Chronic kidney disease, unspecified: Principal | ICD-10-CM

## 2016-04-15 DIAGNOSIS — N179 Acute kidney failure, unspecified: Secondary | ICD-10-CM

## 2016-04-15 DIAGNOSIS — E7849 Other hyperlipidemia: Secondary | ICD-10-CM

## 2016-04-15 MED ORDER — AMLODIPINE BESYLATE 5 MG PO TABS: 5.0000 mg | ORAL_TABLET | Freq: Every day | ORAL | 3 refills | Status: DC

## 2016-04-15 NOTE — Telephone Encounter (Signed)
-----   Message from Charlie Pitter, [place] sent at 04/14/2016 11:07 AM EDT ----- At her visit with me she was amenable to starting statin- we discsused starting low dose to make sure she tolerates it. I would still recommend she start dose as discussed then which was atorvastatin '10mg'$  daily. She can review in f/u with Dr. Meda Coffee at 06/2016 about timing of repeat panel if she has tolerated med at that time.  Ronne Binning. had also called her and left a message as the patient was supposed to get a BMET with these labs to trend her renal function but it wasn't added on (see my result note on 9/15 labs). Anderson Malta was calling him to inform her that this needed to be drawn to trend.   Dayna Dunn PA-C

## 2016-04-15 NOTE — Telephone Encounter (Signed)
Spoke with the pt and informed her that per [facility], she recommends that she continue as planned, and take atorvastatin 10 mg po daily.  Informed the pt that we can recheck her repeat panel at her next OV with Dr Meda Coffee, on 12/15, if Dr Meda Coffee advises so at that time.   Did inform the pt that per [facility], she wanted the pt to come in next week to have a bmet done, to reassess her kidney function.  Informed the pt that this is to follow-up on her recent elevated creatinine level.  Per the pt, she can come in for her lab appt to check a bmet on next Tuesday 10/10.  Pt verbalized understanding and agrees with this plan.    Pt did want to inform Dr Meda Coffee that she is unable to tolerate her amlodipine 10 mg, but if she breaks it in 1/2 and takes 5 mg po daily at night, she is able to tolerate this fine.  Pt states that it helps with her increase in BP throughout the day.  Informed the pt that I will inform Dr Meda Coffee of this and see if I can obtain an order for her to take amlodipine 5 mg po daily at bedtime.  Placed the pt on hold and obtained a verbal order per Dr Meda Coffee for the pt to decrease her amlodipine to 5 mg po daily at bedtime, and provide feedback if she is unable to tolerate this.  Pt verbalized understanding, agrees with this plan, and gracious for all the assistance provided.

## 2016-04-15 NOTE — Telephone Encounter (Signed)
New Message  Pt voiced wanting to speak with nurse.  Pt would not go into detail as to what she wanted.  Please f/u

## 2016-04-19 ENCOUNTER — Other Ambulatory Visit: Payer: Medicare Other | Admitting: *Deleted

## 2016-04-19 DIAGNOSIS — E784 Other hyperlipidemia: Secondary | ICD-10-CM | POA: Diagnosis not present

## 2016-04-19 DIAGNOSIS — R7989 Other specified abnormal findings of blood chemistry: Secondary | ICD-10-CM | POA: Diagnosis not present

## 2016-04-19 DIAGNOSIS — N189 Chronic kidney disease, unspecified: Secondary | ICD-10-CM | POA: Diagnosis not present

## 2016-04-19 DIAGNOSIS — N179 Acute kidney failure, unspecified: Secondary | ICD-10-CM | POA: Diagnosis not present

## 2016-04-19 DIAGNOSIS — E7849 Other hyperlipidemia: Secondary | ICD-10-CM

## 2016-04-19 LAB — BASIC METABOLIC PANEL
BUN: 17 mg/dL (ref 7–25)
CALCIUM: 9.9 mg/dL (ref 8.6–10.4)
CO2: 24 mmol/L (ref 20–31)
Chloride: 104 mmol/L (ref 98–110)
Creat: 1.37 mg/dL — ABNORMAL HIGH (ref 0.60–0.93)
GLUCOSE: 90 mg/dL (ref 65–99)
Potassium: 4.3 mmol/L (ref 3.5–5.3)
SODIUM: 141 mmol/L (ref 135–146)

## 2016-05-13 DIAGNOSIS — Z1231 Encounter for screening mammogram for malignant neoplasm of breast: Secondary | ICD-10-CM | POA: Diagnosis not present

## 2016-05-18 ENCOUNTER — Other Ambulatory Visit: Payer: Self-pay | Admitting: Medical Oncology

## 2016-05-18 DIAGNOSIS — C4330 Malignant melanoma of unspecified part of face: Secondary | ICD-10-CM

## 2016-05-19 ENCOUNTER — Encounter (HOSPITAL_COMMUNITY): Payer: Self-pay

## 2016-05-19 ENCOUNTER — Ambulatory Visit (HOSPITAL_COMMUNITY)
Admission: RE | Admit: 2016-05-19 | Discharge: 2016-05-19 | Disposition: A | Discharge status: Home / Self Care | Payer: Medicare Other | Source: Ambulatory Visit | Attending: Internal Medicine | Admitting: Internal Medicine

## 2016-05-19 ENCOUNTER — Other Ambulatory Visit (HOSPITAL_BASED_OUTPATIENT_CLINIC_OR_DEPARTMENT_OTHER): Payer: Medicare Other

## 2016-05-19 DIAGNOSIS — C4330 Malignant melanoma of unspecified part of face: Secondary | ICD-10-CM | POA: Diagnosis not present

## 2016-05-19 DIAGNOSIS — R911 Solitary pulmonary nodule: Secondary | ICD-10-CM | POA: Insufficient documentation

## 2016-05-19 DIAGNOSIS — I700 Atherosclerosis of aorta: Secondary | ICD-10-CM | POA: Insufficient documentation

## 2016-05-19 DIAGNOSIS — C3492 Malignant neoplasm of unspecified part of left bronchus or lung: Secondary | ICD-10-CM | POA: Insufficient documentation

## 2016-05-19 DIAGNOSIS — J439 Emphysema, unspecified: Secondary | ICD-10-CM | POA: Insufficient documentation

## 2016-05-19 LAB — CBC WITH DIFFERENTIAL/PLATELET
BASO%: 0.5 % (ref 0.0–2.0)
Basophils Absolute: 0.1 10*3/uL (ref 0.0–0.1)
EOS%: 3.9 % (ref 0.0–7.0)
Eosinophils Absolute: 0.5 10*3/uL (ref 0.0–0.5)
HEMATOCRIT: 37.9 % (ref 34.8–46.6)
HEMOGLOBIN: 11.9 g/dL (ref 11.6–15.9)
LYMPH#: 1.3 10*3/uL (ref 0.9–3.3)
LYMPH%: 9.2 % — ABNORMAL LOW (ref 14.0–49.7)
MCH: 27.8 pg (ref 25.1–34.0)
MCHC: 31.4 g/dL — ABNORMAL LOW (ref 31.5–36.0)
MCV: 88.6 fL (ref 79.5–101.0)
MONO#: 1.3 10*3/uL — ABNORMAL HIGH (ref 0.1–0.9)
MONO%: 9.4 % (ref 0.0–14.0)
NEUT%: 77 % — ABNORMAL HIGH (ref 38.4–76.8)
NEUTROS ABS: 10.7 10*3/uL — AB (ref 1.5–6.5)
Platelets: 245 10*3/uL (ref 145–400)
RBC: 4.28 10*6/uL (ref 3.70–5.45)
RDW: 14.4 % (ref 11.2–14.5)
WBC: 13.9 10*3/uL — AB (ref 3.9–10.3)

## 2016-05-19 LAB — COMPREHENSIVE METABOLIC PANEL
ALK PHOS: 99 U/L (ref 40–150)
ALT: 11 U/L (ref 0–55)
ANION GAP: 9 meq/L (ref 3–11)
AST: 13 U/L (ref 5–34)
Albumin: 2.9 g/dL — ABNORMAL LOW (ref 3.5–5.0)
BILIRUBIN TOTAL: 0.48 mg/dL (ref 0.20–1.20)
BUN: 18.2 mg/dL (ref 7.0–26.0)
CALCIUM: 9.9 mg/dL (ref 8.4–10.4)
CO2: 26 meq/L (ref 22–29)
CREATININE: 1.1 mg/dL (ref 0.6–1.1)
Chloride: 106 mEq/L (ref 98–109)
EGFR: 51 mL/min/{1.73_m2} — AB (ref >90)
Glucose: 96 mg/dl (ref 70–140)
Potassium: 4.3 mEq/L (ref 3.5–5.1)
Sodium: 142 mEq/L (ref 136–145)
TOTAL PROTEIN: 6 g/dL — AB (ref 6.4–8.3)

## 2016-05-19 MED ORDER — IOPAMIDOL (ISOVUE-300) INJECTION 61%
75.0000 mL | Freq: Once | INTRAVENOUS | Status: AC | PRN
  Administered 2016-05-19: 75 mL via INTRAVENOUS

## 2016-05-26 ENCOUNTER — Ambulatory Visit (HOSPITAL_BASED_OUTPATIENT_CLINIC_OR_DEPARTMENT_OTHER): Payer: Medicare Other | Admitting: Internal Medicine

## 2016-05-26 ENCOUNTER — Encounter: Payer: Self-pay | Admitting: Internal Medicine

## 2016-05-26 ENCOUNTER — Telehealth: Payer: Self-pay | Admitting: Internal Medicine

## 2016-05-26 VITALS — BP 185/77 | HR 77 | Temp 97.9°F | Resp 18 | Ht 65.0 in | Wt 145.3 lb

## 2016-05-26 DIAGNOSIS — C3412 Malignant neoplasm of upper lobe, left bronchus or lung: Secondary | ICD-10-CM | POA: Diagnosis not present

## 2016-05-26 DIAGNOSIS — R530 Neoplastic (malignant) related fatigue: Secondary | ICD-10-CM | POA: Diagnosis not present

## 2016-05-26 DIAGNOSIS — I10 Essential (primary) hypertension: Secondary | ICD-10-CM | POA: Diagnosis not present

## 2016-05-26 MED ORDER — CLONIDINE HCL 0.1 MG PO TABS
ORAL_TABLET | ORAL | Status: AC
  Filled 2016-05-26: qty 2

## 2016-05-26 MED ORDER — CLONIDINE HCL 0.1 MG PO TABS
0.2000 mg | ORAL_TABLET | Freq: Once | ORAL | Status: AC
  Administered 2016-05-26: 0.2 mg via ORAL

## 2016-05-26 NOTE — Telephone Encounter (Signed)
Gave patient avs report and appointments for February. Central radiology will call re scan.  °

## 2016-05-26 NOTE — Progress Notes (Signed)
[name]   Fax:([phone]) [address] [place] [address]  PRINCIPAL DIAGNOSES:  1. 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 The patient presented at that time with a right upper lobe lesion, as well as suspicious ground-glass opacities in the left lung. The tumor was negative for EGFR mutation and negative for ALK gene translocation. Veristrat test Good. EGFR mutation studies were conflicting, initial test was negative, second test was positive for EGFR mutation in exon 18 and the the test from [facility] one was negative with positive KRAS mutation in Exon 12. 2. Stage IA malignant melanoma, status post wide excision by Dr. Allyson Sabal on Nov 12, 2008.  PRIOR THERAPY:  1) Status post right upper lobectomy with lymph node dissection under the care of Dr. Arlyce Dice on May 21, 2008.  2) Stereotactic radiotherapy to the left upper lobe lung nodule under the care of Dr. Pablo Ledger expected to be completed on 10/21/2011.  3) stereotactic radiotherapy to the left prevascular lymphadenopathy under the care of Dr. Pablo Ledger completed on 09/26/2013. 4) Tarceva 150 mg by mouth daily started 11/29/2013 discontinued today secondary to intolerance with persistent diarrhea and fatigue.  5) Tarceva 100 mg by mouth daily started on 12/27/2013. Discontinued one week after treatment because of intolerance. 6) status post thermal ablation of the left upper lobe recurrent adenocarcinoma under the care of Dr. Laurence Ferrari on 10/01/2014  CURRENT THERAPY: Observation.  Advanced directives: The patient has advanced directive and no change is requested.  INTERVAL HISTORY: [name] 75 y.o. female returns to the clinic today for follow up visit accompanied by her husband. The patient has no  complaints today except for fatigue. She also has shortness breath with exertion but no significant chest pain, cough or hemoptysis. She is still under stress taking care of her husband who had metastatic disease to the brain from the renal cell carcinoma. Her blood pressure is uncontrolled. She is working with her primary care physician and cardiologist to adjust her medication. She denied having any significant weight loss or night sweats. The patient denied having any fever or chills. She has no nausea or vomiting. She had repeat CT scan of the chest and she is here today for evaluation and discussion of her scan results.  MEDICAL HISTORY: Past Medical History:  Diagnosis Date  . Arthritis    back.  . Cervical ca ([place]) dx'd 1988   surg only  . CKD (chronic kidney disease), stage III   . Complication of anesthesia    difficulty awakening, dysrhythmia post surgery, (prolonged sedation level)  . Coronary artery calcification seen on CT scan   . Headache    occ. sinus type headaches  . Hx of radiation therapy 09/16/13-09/30/13   prevascular lymph node  . Hyperlipidemia   . Hypertension   . Hyperthyroidism    dr Loanne Drilling- radioactive iodine treatment 2011- resolved  . LBBB (left bundle branch block)   . Lung cancer ([place])    Bilaterally, RUL lobectomy and spot involving both lungs- "cancer"  . Macular degeneration    bilateral  . Melanoma ([place])    left leg  . Radiation 10/11/11-10/21/11   Left upper lobe adenocarcinoma  . Tremor    hand- intention tremor    ALLERGIES:  is allergic to meperidine hcl; penicillins; morphine; oxycodone-acetaminophen; propoxyphene n-acetaminophen; and  tramadol hcl.  MEDICATIONS:  Current Outpatient Prescriptions  Medication Sig Dispense Refill  . acetaminophen (TYLENOL) 650 MG CR tablet Take 650 mg by mouth every 8 (eight) hours as needed for pain.    Marland Kitchen aspirin EC 81 MG tablet Take 1 tablet (81 mg total) by mouth daily. 90 tablet 3  . atenolol (TENORMIN) 25  MG tablet Take 1 tablet (25 mg total) by mouth 2 (two) times daily. 180 tablet 11  . atorvastatin (LIPITOR) 10 MG tablet Take 1 tablet (10 mg total) by mouth daily. 90 tablet 3  . calcium-vitamin D (OSCAL WITH D) 500-200 MG-UNIT per tablet Take 1 tablet by mouth every morning.     . diltiazem (DILT-XR) 240 MG 24 hr capsule Take 1 capsule (240 mg total) by mouth daily. 90 capsule 11  . etodolac (LODINE) 400 MG tablet Take 400 mg by mouth 2 (two) times daily.    . famotidine (PEPCID) 20 MG tablet Take 20 mg by mouth 2 (two) times daily.     Marland Kitchen gabapentin (NEURONTIN) 300 MG capsule Take 300 mg by mouth at bedtime. States she takes 2 hs  2  . levothyroxine (SYNTHROID) 100 MCG tablet Take 1 tablet (100 mcg total) by mouth daily before breakfast. 30 tablet 2  . Multiple Vitamin (MULITIVITAMIN WITH MINERALS) TABS Take 1 tablet by mouth every morning.     Marland Kitchen amLODipine (NORVASC) 10 MG tablet   11  . amLODipine (NORVASC) 2.5 MG tablet     . amLODipine (NORVASC) 5 MG tablet Take 1 tablet (5 mg total) by mouth at bedtime. (Patient not taking: Reported on 05/26/2016) 30 tablet 3  . fluticasone (FLONASE) 50 MCG/ACT nasal spray Place into the nose.    . Multiple Vitamin (THERA) TABS Take by mouth.     No current facility-administered medications for this visit.     SURGICAL HISTORY:  Past Surgical History:  Procedure Laterality Date  . CATARACT EXTRACTION, BILATERAL Bilateral   . conization for cervical dysplasia    . esophogus stretching    . rul lobectomy  2010  . TONSILLECTOMY      REVIEW OF SYSTEMS:  Constitutional: positive for fatigue Eyes: negative Ears, nose, mouth, throat, and face: negative Respiratory: positive for dyspnea on exertion Cardiovascular: negative Gastrointestinal: negative Genitourinary:negative Integument/breast: negative Hematologic/lymphatic: negative Musculoskeletal:negative Neurological: negative Behavioral/Psych: negative Endocrine:  negative Allergic/Immunologic: negative   PHYSICAL EXAMINATION: General appearance: alert, cooperative and no distress Head: Normocephalic, without obvious abnormality, atraumatic, Surgical scar on the right side of the upper nose and below the eye secondary to excision of basal cell carcinoma Neck: no adenopathy Lymph nodes: Cervical, supraclavicular, and axillary nodes normal. Resp: clear to auscultation bilaterally Cardio: regular rate and rhythm, S1, S2 normal, no murmur, click, rub or gallop GI: soft, non-tender; bowel sounds normal; no masses,  no organomegaly Extremities: extremities normal, atraumatic, no cyanosis or edema Neurologic: Alert and oriented X 3, normal strength and tone. Normal symmetric reflexes. Normal coordination and gait  ECOG PERFORMANCE STATUS: 1 - Symptomatic but completely ambulatory  Blood pressure (!) 185/77, pulse 77, temperature 97.9 F (36.6 C), temperature source Oral, resp. rate 18, height '5\' 5"'  (1.651 m), weight 145 lb 4.8 oz (65.9 kg), SpO2 98 %.  LABORATORY DATA: Lab Results  Component Value Date   WBC 13.9 (H) 05/19/2016   HGB 11.9 05/19/2016   HCT 37.9 05/19/2016   MCV 88.6 05/19/2016   PLT 245 05/19/2016      Chemistry  Component Value Date/Time   NA 142 05/19/2016 1131   K 4.3 05/19/2016 1131   CL 104 04/19/2016 1103   CL 105 07/17/2012 1434   CO2 26 05/19/2016 1131   BUN 18.2 05/19/2016 1131   CREATININE 1.1 05/19/2016 1131      Component Value Date/Time   CALCIUM 9.9 05/19/2016 1131   ALKPHOS 99 05/19/2016 1131   AST 13 05/19/2016 1131   ALT 11 05/19/2016 1131   BILITOT 0.48 05/19/2016 1131       RADIOGRAPHIC STUDIES: Ct Chest W Contrast  Result Date: 05/19/2016 CLINICAL DATA:  Lung cancer status post right upper lobectomy in 2010. Chronic cough. EXAM: CT CHEST WITH CONTRAST TECHNIQUE: Multidetector CT imaging of the chest was performed during intravenous contrast administration. CONTRAST:  11m ISOVUE-300 IOPAMIDOL  (ISOVUE-300) INJECTION 61% COMPARISON:  02/17/2016 FINDINGS: Cardiovascular: The heart size is normal. No pericardial effusion. Coronary artery calcification is noted. Atherosclerotic calcification is noted in the wall of the thoracic aorta. Mediastinum/Nodes: Stable 8 mm precarinal lymph node. No change 6 mm short axis subcarinal lymph node. No mediastinal lymphadenopathy. The esophagus has normal imaging features. Small to moderate hiatal hernia noted. 7 mm short axis lymph node adjacent to the hiatal hernia is not substantially changed in the interval. There is no axillary lymphadenopathy. Lungs/Pleura: Left upper lobe mass is irregular in shape, as before. Measuring at approximately the same level and in the same dimensions as on the prior study, the lesion appears unchanged measuring 2.8 x 2.3 cm today compared to 2.7 x 2.4 cm previously. However, more inferiorly, along the major fissure, there has been progression of soft tissue associated with posterior and medial aspect of this lesion (compare image 46 of series 4 today to image 39 of series 5 previously). Interval increase in medial left upper lobe collapse presumably related to radiation therapy. 10 mm cavitary nodule posterior left upper lobe is stable when measured at the same level (image 51 series 4 today. 12 mm left lower lobe nodule (image 49 series 4) was 9 mm previously. Sub solid left lower lobe lesion seen on image 57 is not substantially changed. Small nodule in the medial aspect of the inferior left lower lobe (image 116 series 4) is stable. Multiple other ground-glass nodules are seen scattered in the lungs bilaterally, without substantial change. Upper Abdomen: 2 cm exophytic lesion posterior right kidney is stable in size. This has attenuation too high to represent a simple cyst but may be a cyst complicated by proteinaceous debris or hemorrhage. Atherosclerotic calcification noted in the abdominal aorta. Musculoskeletal: Bone windows reveal  no worrisome lytic or sclerotic osseous lesions. IMPRESSION: 1. Interval progression of soft tissue associated with the posterior and medial aspect of the dominant left upper lobe pulmonary lesion. Imaging features concerning for disease progression. 2. Interval progression of left lower lobe pulmonary nodule, also concerning for progressive disease. 3. Coronary artery and thoracoabdominal aortic atherosclerosis. 4. Emphysema. Electronically Signed   By: EMisty StanleyM.D.   On: 05/19/2016 14:51   ASSESSMENT AND PLAN: This is a very pleasant 75years old white female with recurrent non-small cell lung cancer, adenocarcinoma with conflicting reports regarding the EGFR mutation including the recent one from the biopsy of the left upper lobe lung nodule that showed mutation at exon 18. She recently completed a course of stereotactic radiotherapy to the left prevascular lymphadenopathy under the care of Dr. WPablo Ledger She was recently treated with a course of Tarceva initially at 150 mg by mouth daily  for one month followed by 1 week treatment at a reduced dose of 100 mg by mouth daily but unfortunately the patient was unable to tolerate her treatment and this was discontinued. She underwent thermal ablation of the left upper lobe nodule by interventional radiology and tolerated the procedure well. The recent CT scan of the chest showed further increase in the size of the dominant left upper lobe pulmonary lesion as well as left lower lobe pulmonary nodule. I discussed the scan results and showed the images to the patient today. We discussed several options including continuous observation and monitoring versus resuming treatment with targeted tyrosine kinase inhibitor. The patient is currently under a lot of stress and not ready to resume any treatment at this point. She will continue on observation for now. I will arrange for the patient to have repeat CT scan of the chest with contrast in 3 months for  reevaluation of her disease. For hypertension, I will give the patient a dose of clonidine 0.2 mg by mouth 1 today. She was advised to discuss with her primary care physician and cardiologist adjustment of her medications. She was advised to call immediately if she has any concerning symptoms in the interval.  All questions were answered. The patient knows to call the clinic with any problems, questions or concerns. We can certainly see the patient much sooner if necessary. I spent 20 minutes counseling the patient face to face. The total time spent in the appointment was 30 minutes. Disclaimer: This note was dictated with voice recognition software. Similar sounding words can inadvertently be transcribed and may not be corrected upon review.

## 2016-06-13 DIAGNOSIS — J069 Acute upper respiratory infection, unspecified: Secondary | ICD-10-CM | POA: Diagnosis not present

## 2016-06-13 DIAGNOSIS — R197 Diarrhea, unspecified: Secondary | ICD-10-CM | POA: Diagnosis not present

## 2016-06-17 ENCOUNTER — Encounter: Payer: Self-pay | Admitting: Cardiology

## 2016-06-20 ENCOUNTER — Other Ambulatory Visit: Payer: Self-pay | Admitting: Cardiology

## 2016-06-20 DIAGNOSIS — E039 Hypothyroidism, unspecified: Secondary | ICD-10-CM

## 2016-06-21 ENCOUNTER — Other Ambulatory Visit: Payer: Self-pay | Admitting: Internal Medicine

## 2016-06-21 ENCOUNTER — Ambulatory Visit
Admission: RE | Admit: 2016-06-21 | Discharge: 2016-06-21 | Disposition: A | Discharge status: Home / Self Care | Payer: Medicare Other | Source: Ambulatory Visit | Attending: Internal Medicine | Admitting: Internal Medicine

## 2016-06-21 DIAGNOSIS — R05 Cough: Secondary | ICD-10-CM | POA: Diagnosis not present

## 2016-06-21 DIAGNOSIS — R0602 Shortness of breath: Secondary | ICD-10-CM | POA: Diagnosis not present

## 2016-06-21 DIAGNOSIS — J209 Acute bronchitis, unspecified: Secondary | ICD-10-CM

## 2016-06-21 NOTE — Telephone Encounter (Signed)
Review and advise on refill request

## 2016-06-21 NOTE — Telephone Encounter (Signed)
Pharmacy requesting a refill on levothyroxine 100 mcg, would you like to refill this medication? Please advise

## 2016-06-24 ENCOUNTER — Ambulatory Visit: Payer: Medicare Other | Admitting: Cardiology

## 2016-06-28 DIAGNOSIS — Z1389 Encounter for screening for other disorder: Secondary | ICD-10-CM | POA: Diagnosis not present

## 2016-06-28 DIAGNOSIS — I10 Essential (primary) hypertension: Secondary | ICD-10-CM | POA: Diagnosis not present

## 2016-06-28 DIAGNOSIS — R05 Cough: Secondary | ICD-10-CM | POA: Diagnosis not present

## 2016-07-12 DIAGNOSIS — R05 Cough: Secondary | ICD-10-CM | POA: Diagnosis not present

## 2016-07-12 DIAGNOSIS — K219 Gastro-esophageal reflux disease without esophagitis: Secondary | ICD-10-CM | POA: Diagnosis not present

## 2016-07-12 DIAGNOSIS — E039 Hypothyroidism, unspecified: Secondary | ICD-10-CM | POA: Diagnosis not present

## 2016-07-12 DIAGNOSIS — R5383 Other fatigue: Secondary | ICD-10-CM | POA: Diagnosis not present

## 2016-07-12 DIAGNOSIS — R0789 Other chest pain: Secondary | ICD-10-CM | POA: Diagnosis not present

## 2016-07-12 NOTE — Progress Notes (Signed)
Cardiology Office Note    Date:  07/13/2016  ID:  [name], DOB [birth year], MRN [id number] PCP:  Wenda Low, MD  Cardiologist:  Ron Parker -> Dr. Meda Coffee   Chief Complaint: SOB, fatigue  History of Present Illness:  [name] is a 76 y.o. female with history of LBBB, nonsmall cell lung cancer (s/p RULectomy 2009, radiation to LUL nodule 2013, radiation to lymphadenopathy 2015, Tarceva rx, thermal ablation of recurrent LUL adenocarcinoma 2016), arthritis, cervical CA 1988 s/p surgery, HTN, hypothyroidism, macular degeneration, melanoma of leg, intention tremor, probable CKD stage III, coronary calcification on CT who presents for f/u. Previously followed by Dr. Ron Parker - underwent cardiac catheterization in the 80s that showed no significant coronary artery disease. Carotid US 2014 showed 1-39% stenosis. Husband has metastatic kidney cancer to the brain.  [name] was seen in the fall for dyspnea at which time Dr. Meda Coffee added amlodipine. [name] recommended to start statin therapy given coronary calcification noted on CT scanning as well as levothyroxine given abnormal TSH. The patient has a history of being very sensitive to medications so [name] started each med in a stepwise progression. At one point her HCTZ was discontinued due to elevated Cr. F/u TFTs 04/2016 wnl, LDL 147 prior to initiation of atorvastatin.  Subsequent labs 05/2016: Cr 1.1, K 4.3, albumin 2.9, Hgb 11.9, LDL 104. [name] did not tolerate even low dose amlodipine due to fatigue. [name] continues to notice occasional fluctuations in her BP mostly from 283-151 systolic range. [name] also continues to complain of generalized fatigue and dyspnea. There have been a few nights where [name] wakes up from sleep feeling a sensation of something on her chest. [name] says it does not feel like chest pain, just an unusual sensation. [name] has h/o GERD so [name]'s not sure if that's contributing. The patient has since seen her PCP who restarted HCTZ, asked her to  wean off Lodine, started iron supp for mild anemia, and recommended [name] switch Pepcid to a PPI ([name]'s unsure of the name, possibly Protonix). [name] has f/u with PCP in several weeks to revisit.   Past Medical History:  Diagnosis Date  . Arthritis    back.  . Cervical ca ([place]) dx'd 1988   surg only  . CKD (chronic kidney disease), stage III   . Complication of anesthesia    difficulty awakening, dysrhythmia post surgery, (prolonged sedation level)  . Coronary artery calcification seen on CT scan   . Headache    occ. sinus type headaches  . Hx of radiation therapy 09/16/13-09/30/13   prevascular lymph node  . Hyperlipidemia   . Hypertension   . Hyperthyroidism    dr Loanne Drilling- radioactive iodine treatment 2011- resolved  . LBBB (left bundle branch block)   . Lung cancer ([place])    Bilaterally, RUL lobectomy and spot involving both lungs- "cancer"  . Macular degeneration    bilateral  . Melanoma ([place])    left leg  . Radiation 10/11/11-10/21/11   Left upper lobe adenocarcinoma  . Tremor    hand- intention tremor    Past Surgical History:  Procedure Laterality Date  . CATARACT EXTRACTION, BILATERAL Bilateral   . conization for cervical dysplasia    . esophogus stretching    . rul lobectomy  2010  . TONSILLECTOMY      Current Medications: Current Outpatient Prescriptions  Medication Sig Dispense Refill  . acetaminophen (TYLENOL) 650 MG CR tablet Take 650 mg by mouth every 8 (eight) hours as needed for  pain.    . amLODipine (NORVASC) 10 MG tablet   11  . amLODipine (NORVASC) 2.5 MG tablet     . amLODipine (NORVASC) 5 MG tablet Take 1 tablet (5 mg total) by mouth at bedtime. 30 tablet 3  . aspirin EC 81 MG tablet Take 1 tablet (81 mg total) by mouth daily. 90 tablet 3  . atenolol (TENORMIN) 25 MG tablet Take 1 tablet (25 mg total) by mouth 2 (two) times daily. 180 tablet 11  . atorvastatin (LIPITOR) 10 MG tablet Take 1 tablet (10 mg total) by mouth daily. 90 tablet 3  .  calcium-vitamin D (OSCAL WITH D) 500-200 MG-UNIT per tablet Take 1 tablet by mouth every morning.     . diltiazem (DILT-XR) 240 MG 24 hr capsule Take 1 capsule (240 mg total) by mouth daily. 90 capsule 11  . etodolac (LODINE) 400 MG tablet Take 400 mg by mouth 2 (two) times daily.    . fluticasone (FLONASE) 50 MCG/ACT nasal spray Place into the nose.    . gabapentin (NEURONTIN) 300 MG capsule Take 300 mg by mouth at bedtime. States [name] takes 2 hs  2  . hydrochlorothiazide (HYDRODIURIL) 25 MG tablet Take 25 mg by mouth daily.    Marland Kitchen levothyroxine (SYNTHROID, LEVOTHROID) 100 MCG tablet TAKE 1 TABLET(100 MCG) BY MOUTH DAILY BEFORE BREAKFAST 30 tablet 0  . Multiple Vitamin (MULITIVITAMIN WITH MINERALS) TABS Take 1 tablet by mouth every morning.     . Multiple Vitamin (THERA) TABS Take by mouth.    . pantoprazole (PROTONIX) 40 MG tablet Take 1 tablet by mouth daily.     No current facility-administered medications for this visit.      Allergies:   Meperidine hcl; Penicillins; Morphine; Oxycodone-acetaminophen; Propoxyphene n-acetaminophen; and Tramadol hcl   Social History   Social History  . Marital status: Married    Spouse name: N/A  . Number of children: 2  . Years of education: N/A   Occupational History  . homemaker Retired   Social History Main Topics  . Smoking status: Former Smoker    Packs/day: 1.00    Years: 17.00    Types: Cigarettes    Quit date: 09/28/1975  . Smokeless tobacco: Never Used     Comment: 33 yrs ago  . Alcohol use Yes     Comment: glass wine daily  . Drug use: No  . Sexual activity: Not Asked   Other Topics Concern  . None   Social History Personal assistant   Married 1963   4 grandchildren and 2 step-grandchildren   Marriage in good health            Physician Roster:   Oncologist- Dr Julien Nordmann   Surgeon- Dr Lindell Spar- Dr Mable Fill- Dr Allyson Sabal     Family History:  The patient's family history includes Asthma in her mother;  Cancer in her maternal grandfather and maternal uncle; Heart disease in her mother; Thyroid disease in her daughter.   ROS:   Please see the history of present illness.  All other systems are reviewed and otherwise negative.    PHYSICAL EXAM:   VS:  BP (!) 144/78   Pulse 76   Ht 5' 3.5" (1.613 m)   Wt 146 lb (66.2 kg)   BMI 25.46 kg/m   BMI: Body mass index is 25.46 kg/m. GEN: Well nourished, well developed WF, in no acute distress  HEENT: normocephalic, atraumatic Neck: no JVD, carotid  bruits, or masses Cardiac: RRR; no murmurs, rubs, or gallops, no edema  Respiratory:  clear to auscultation bilaterally, normal work of breathing GI: soft, nontender, nondistended, + BS MS: no deformity or atrophy  Skin: warm and dry, no rash Neuro:  Alert and Oriented x 3, Strength and sensation are intact, follows commands Psych: euthymic mood, full affect  Wt Readings from Last 3 Encounters:  07/13/16 146 lb (66.2 kg)  05/26/16 145 lb 4.8 oz (65.9 kg)  03/25/16 154 lb 12.8 oz (70.2 kg)      Studies/Labs Reviewed:   EKG:  EKG was ordered today and personally reviewed by me and demonstrates NSR 76bpm, left axis deviation, left bundle branch block, nonspecific changes from prior  Recent Labs: 03/11/2016: Magnesium 2.0 04/12/2016: TSH 1.23 05/19/2016: ALT 11; BUN 18.2; Creatinine 1.1; HGB 11.9; Platelets 245; Potassium 4.3; Sodium 142   Lipid Panel    Component Value Date/Time   CHOL 184 04/12/2016 0841   TRIG 193 (H) 04/12/2016 0841   HDL 41 (L) 04/12/2016 0841   CHOLHDL 4.5 04/12/2016 0841   VLDL 39 (H) 04/12/2016 0841   LDLCALC 104 04/12/2016 0841   LDLDIRECT 137.6 05/17/2013 0937    Additional studies/ records that were reviewed today include: Summarized above.    ASSESSMENT & PLAN:   1. Chest discomfort/fatigue/dyspnea with known coronary calcium on CT - at this point I think it is prudent to proceed with nuclear stress testing. The patient is eager to have this  information. Given her LBBB and chronic back issues will arrange Lexiscan myoview. Continue ASA, BB, statin. Continue new PPI per PCP. Some component of her symptoms could be due to her known lung cancer. [name] last saw Dr. Julien Nordmann in 05/2016 at which time the patient was not ready to resume further treatment and elected to continue surveillance with serial CT scanning. 2. HTN - variable. Patient is extremely sensitive to medications. Did not tolerate amlodipine (but seems to be doing OK on diltiazem so far). [name] has f/u with her PCP to recheck since restarting HCTZ. Most of her BP spikes seem to be in the morning. I asked her to shift her diltiazem to nighttime dosing to see if this helps (after her stress test has been completed, since [name]'d have to hold for the test). 3. Hyperlipidemia - tolerating current atorvastatin, has been sensitive to higher doses in the past. 4. Hypothyroidism - patient reports recent recheck by PCP was wnl.  Disposition: F/u with Dr. Meda Coffee in 3 months, sooner if needed.  Medication Adjustments/Labs and Tests Ordered: Current medicines are reviewed at length with the patient today.  Concerns regarding medicines are outlined above. Medication changes, Labs and Tests ordered today are summarized above and listed in the Patient Instructions accessible in Encounters.   Raechel Ache PA-C  07/13/2016 11:59 AM    [facility] [place], [place], [place]  [address] Phone: [phone]; Fax: [phone]

## 2016-07-13 ENCOUNTER — Encounter: Payer: Self-pay | Admitting: Physician Assistant

## 2016-07-13 ENCOUNTER — Telehealth (HOSPITAL_COMMUNITY): Payer: Self-pay | Admitting: *Deleted

## 2016-07-13 ENCOUNTER — Other Ambulatory Visit: Payer: Self-pay | Admitting: Cardiology

## 2016-07-13 ENCOUNTER — Ambulatory Visit (INDEPENDENT_AMBULATORY_CARE_PROVIDER_SITE_OTHER): Payer: Medicare Other | Admitting: Physician Assistant

## 2016-07-13 VITALS — BP 144/78 | HR 76 | Ht 63.5 in | Wt 146.0 lb

## 2016-07-13 DIAGNOSIS — R5383 Other fatigue: Secondary | ICD-10-CM

## 2016-07-13 DIAGNOSIS — I10 Essential (primary) hypertension: Secondary | ICD-10-CM

## 2016-07-13 DIAGNOSIS — I2510 Atherosclerotic heart disease of native coronary artery without angina pectoris: Secondary | ICD-10-CM | POA: Diagnosis not present

## 2016-07-13 DIAGNOSIS — E039 Hypothyroidism, unspecified: Secondary | ICD-10-CM

## 2016-07-13 DIAGNOSIS — E785 Hyperlipidemia, unspecified: Secondary | ICD-10-CM | POA: Diagnosis not present

## 2016-07-13 DIAGNOSIS — R0789 Other chest pain: Secondary | ICD-10-CM

## 2016-07-13 NOTE — Patient Instructions (Signed)
Medication Instructions:  Your physician recommends that you continue on your current medications as directed. Please refer to the Current Medication list given to you today.   Labwork: None  Testing/Procedures: Your physician has requested that you have a lexiscan myoview. For further information please visit [email or web address]. Please follow instruction sheet, as given.  Follow-Up: Your physician recommends that you schedule a follow-up appointment in: 3 months with Dr. Meda Coffee.  Any Other Special Instructions Will Be Listed Below (If Applicable).     If you need a refill on your cardiac medications before your next appointment, please call your pharmacy.

## 2016-07-13 NOTE — Telephone Encounter (Signed)
Patient given detailed instructions per Myocardial Perfusion Study Information Sheet for the test on 07/14/16 at 1000. Patient notified to arrive 15 minutes early and that it is imperative to arrive on time for appointment to keep from having the test rescheduled.  If you need to cancel or reschedule your appointment, please call the office within 24 hours of your appointment. Failure to do so may result in a cancellation of your appointment, and a $50 no show fee. Patient verbalized understanding.Dublin Grayer, Ranae Palms

## 2016-07-14 ENCOUNTER — Ambulatory Visit (HOSPITAL_COMMUNITY): Payer: Medicare Other | Attending: Cardiovascular Disease

## 2016-07-14 DIAGNOSIS — R5383 Other fatigue: Secondary | ICD-10-CM | POA: Insufficient documentation

## 2016-07-14 DIAGNOSIS — R0789 Other chest pain: Secondary | ICD-10-CM | POA: Insufficient documentation

## 2016-07-14 LAB — MYOCARDIAL PERFUSION IMAGING
CHL CUP NUCLEAR SDS: 1
CHL CUP NUCLEAR SRS: 18
LV dias vol: 79 mL (ref 46–106)
LV sys vol: 40 mL
NUC STRESS TID: 1.01
Peak HR: 93 {beats}/min
RATE: 0.27
Rest HR: 65 {beats}/min
SSS: 19

## 2016-07-14 MED ORDER — TECHNETIUM TC 99M TETROFOSMIN IV KIT
10.1000 | PACK | Freq: Once | INTRAVENOUS | Status: AC | PRN
  Administered 2016-07-14: 10.1 via INTRAVENOUS
  Filled 2016-07-14: qty 11

## 2016-07-14 MED ORDER — TECHNETIUM TC 99M TETROFOSMIN IV KIT
32.5000 | PACK | Freq: Once | INTRAVENOUS | Status: AC | PRN
Start: 2016-07-14 — End: 2016-07-14
  Administered 2016-07-14: 32.5 via INTRAVENOUS
  Filled 2016-07-14: qty 33

## 2016-07-14 MED ORDER — REGADENOSON 0.4 MG/5ML IV SOLN
0.4000 mg | Freq: Once | INTRAVENOUS | Status: AC
  Administered 2016-07-14: 0.4 mg via INTRAVENOUS

## 2016-07-14 NOTE — Addendum Note (Signed)
Addended by: Zebedee Iba on: 07/14/2016 02:06 PM   Modules accepted: Orders

## 2016-07-18 ENCOUNTER — Telehealth: Payer: Self-pay | Admitting: *Deleted

## 2016-07-18 DIAGNOSIS — I2510 Atherosclerotic heart disease of native coronary artery without angina pectoris: Secondary | ICD-10-CM

## 2016-07-18 DIAGNOSIS — I2584 Coronary atherosclerosis due to calcified coronary lesion: Principal | ICD-10-CM

## 2016-07-18 NOTE — Telephone Encounter (Signed)
Pt aware of her stress test results and is agreeable to have the Echo done. Order will be put in and someone from the scheduling dept will call and get it scheduled. She verbalized understanding

## 2016-07-18 NOTE — Telephone Encounter (Signed)
-----   Message from Charlie Pitter, [place] sent at 07/18/2016  9:42 AM EST ----- Please call patient. Test showed a defect that either may be prior scar on the heart or related to her known conduction issue (LBBB and therefore not significant and just artifact). Dr. Meda Coffee looked at it and reports no ischemia. Recommend 2D echo to further evaluate strength and wall motion of heart. Dayna Dunn PA-C

## 2016-07-27 ENCOUNTER — Other Ambulatory Visit (HOSPITAL_COMMUNITY): Payer: Medicare Other

## 2016-08-04 ENCOUNTER — Ambulatory Visit (HOSPITAL_COMMUNITY): Payer: Medicare Other | Attending: Internal Medicine

## 2016-08-04 DIAGNOSIS — I2510 Atherosclerotic heart disease of native coronary artery without angina pectoris: Secondary | ICD-10-CM | POA: Insufficient documentation

## 2016-08-04 DIAGNOSIS — I313 Pericardial effusion (noninflammatory): Secondary | ICD-10-CM | POA: Insufficient documentation

## 2016-08-04 DIAGNOSIS — I2584 Coronary atherosclerosis due to calcified coronary lesion: Secondary | ICD-10-CM | POA: Insufficient documentation

## 2016-08-16 ENCOUNTER — Other Ambulatory Visit: Payer: Self-pay | Admitting: Cardiology

## 2016-08-16 DIAGNOSIS — E039 Hypothyroidism, unspecified: Secondary | ICD-10-CM

## 2016-08-24 ENCOUNTER — Ambulatory Visit (HOSPITAL_COMMUNITY)
Admission: RE | Admit: 2016-08-24 | Discharge: 2016-08-24 | Disposition: A | Discharge status: Home / Self Care | Payer: Medicare Other | Source: Ambulatory Visit | Attending: Internal Medicine | Admitting: Internal Medicine

## 2016-08-24 ENCOUNTER — Other Ambulatory Visit (HOSPITAL_BASED_OUTPATIENT_CLINIC_OR_DEPARTMENT_OTHER): Payer: Medicare Other

## 2016-08-24 DIAGNOSIS — I2510 Atherosclerotic heart disease of native coronary artery without angina pectoris: Secondary | ICD-10-CM | POA: Diagnosis not present

## 2016-08-24 DIAGNOSIS — I10 Essential (primary) hypertension: Secondary | ICD-10-CM | POA: Insufficient documentation

## 2016-08-24 DIAGNOSIS — C3412 Malignant neoplasm of upper lobe, left bronchus or lung: Secondary | ICD-10-CM | POA: Insufficient documentation

## 2016-08-24 DIAGNOSIS — K449 Diaphragmatic hernia without obstruction or gangrene: Secondary | ICD-10-CM | POA: Diagnosis not present

## 2016-08-24 DIAGNOSIS — I700 Atherosclerosis of aorta: Secondary | ICD-10-CM | POA: Insufficient documentation

## 2016-08-24 LAB — CBC WITH DIFFERENTIAL/PLATELET
BASO%: 1.2 % (ref 0.0–2.0)
Basophils Absolute: 0.1 10*3/uL (ref 0.0–0.1)
EOS%: 14.1 % — ABNORMAL HIGH (ref 0.0–7.0)
Eosinophils Absolute: 1.1 10*3/uL — ABNORMAL HIGH (ref 0.0–0.5)
HCT: 38.3 % (ref 34.8–46.6)
HGB: 12.5 g/dL (ref 11.6–15.9)
LYMPH%: 19.6 % (ref 14.0–49.7)
MCH: 28.7 pg (ref 25.1–34.0)
MCHC: 32.7 g/dL (ref 31.5–36.0)
MCV: 87.9 fL (ref 79.5–101.0)
MONO#: 0.9 10*3/uL (ref 0.1–0.9)
MONO%: 11.3 % (ref 0.0–14.0)
NEUT%: 53.8 % (ref 38.4–76.8)
NEUTROS ABS: 4.3 10*3/uL (ref 1.5–6.5)
Platelets: 239 10*3/uL (ref 145–400)
RBC: 4.36 10*6/uL (ref 3.70–5.45)
RDW: 16 % — ABNORMAL HIGH (ref 11.2–14.5)
WBC: 7.9 10*3/uL (ref 3.9–10.3)
lymph#: 1.5 10*3/uL (ref 0.9–3.3)

## 2016-08-24 LAB — COMPREHENSIVE METABOLIC PANEL
ALT: 16 U/L (ref 0–55)
AST: 15 U/L (ref 5–34)
Albumin: 3.5 g/dL (ref 3.5–5.0)
Alkaline Phosphatase: 65 U/L (ref 40–150)
Anion Gap: 10 mEq/L (ref 3–11)
BILIRUBIN TOTAL: 0.57 mg/dL (ref 0.20–1.20)
BUN: 21.1 mg/dL (ref 7.0–26.0)
CO2: 28 meq/L (ref 22–29)
CREATININE: 1.2 mg/dL — AB (ref 0.6–1.1)
Calcium: 10.6 mg/dL — ABNORMAL HIGH (ref 8.4–10.4)
Chloride: 104 mEq/L (ref 98–109)
EGFR: 46 mL/min/{1.73_m2} — ABNORMAL LOW (ref >90)
GLUCOSE: 99 mg/dL (ref 70–140)
Potassium: 4.2 mEq/L (ref 3.5–5.1)
SODIUM: 142 meq/L (ref 136–145)
TOTAL PROTEIN: 6.2 g/dL — AB (ref 6.4–8.3)

## 2016-08-24 MED ORDER — SODIUM CHLORIDE 0.9 % IJ SOLN
INTRAMUSCULAR | Status: AC
  Filled 2016-08-24: qty 50

## 2016-08-24 MED ORDER — IOPAMIDOL (ISOVUE-300) INJECTION 61%
INTRAVENOUS | Status: AC
  Administered 2016-08-24: 75 mL
  Filled 2016-08-24: qty 75

## 2016-08-29 DIAGNOSIS — E7800 Pure hypercholesterolemia, unspecified: Secondary | ICD-10-CM | POA: Diagnosis not present

## 2016-08-29 DIAGNOSIS — M8588 Other specified disorders of bone density and structure, other site: Secondary | ICD-10-CM | POA: Diagnosis not present

## 2016-08-29 DIAGNOSIS — Z23 Encounter for immunization: Secondary | ICD-10-CM | POA: Diagnosis not present

## 2016-08-29 DIAGNOSIS — E039 Hypothyroidism, unspecified: Secondary | ICD-10-CM | POA: Diagnosis not present

## 2016-08-29 DIAGNOSIS — I10 Essential (primary) hypertension: Secondary | ICD-10-CM | POA: Diagnosis not present

## 2016-08-29 DIAGNOSIS — I700 Atherosclerosis of aorta: Secondary | ICD-10-CM | POA: Diagnosis not present

## 2016-08-29 DIAGNOSIS — N183 Chronic kidney disease, stage 3 (moderate): Secondary | ICD-10-CM | POA: Diagnosis not present

## 2016-08-29 DIAGNOSIS — D649 Anemia, unspecified: Secondary | ICD-10-CM | POA: Diagnosis not present

## 2016-08-29 DIAGNOSIS — C3491 Malignant neoplasm of unspecified part of right bronchus or lung: Secondary | ICD-10-CM | POA: Diagnosis not present

## 2016-08-29 DIAGNOSIS — K219 Gastro-esophageal reflux disease without esophagitis: Secondary | ICD-10-CM | POA: Diagnosis not present

## 2016-08-29 DIAGNOSIS — Z0000 Encounter for general adult medical examination without abnormal findings: Secondary | ICD-10-CM | POA: Diagnosis not present

## 2016-08-29 DIAGNOSIS — R7309 Other abnormal glucose: Secondary | ICD-10-CM | POA: Diagnosis not present

## 2016-08-31 ENCOUNTER — Ambulatory Visit (HOSPITAL_BASED_OUTPATIENT_CLINIC_OR_DEPARTMENT_OTHER): Payer: Medicare Other | Admitting: Internal Medicine

## 2016-08-31 ENCOUNTER — Encounter: Payer: Self-pay | Admitting: Internal Medicine

## 2016-08-31 ENCOUNTER — Telehealth: Payer: Self-pay | Admitting: Internal Medicine

## 2016-08-31 VITALS — BP 162/72 | HR 76 | Temp 98.1°F | Resp 18 | Ht 64.0 in | Wt 145.1 lb

## 2016-08-31 DIAGNOSIS — I10 Essential (primary) hypertension: Secondary | ICD-10-CM

## 2016-08-31 DIAGNOSIS — C3412 Malignant neoplasm of upper lobe, left bronchus or lung: Secondary | ICD-10-CM | POA: Diagnosis not present

## 2016-08-31 NOTE — Progress Notes (Signed)
[facility] [phone]   Fax:([phone]) [address] [place] [address]  PRINCIPAL DIAGNOSES:  1. 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 The patient presented at that time with a right upper lobe lesion, as well as suspicious ground-glass opacities in the left lung. The tumor was negative for EGFR mutation and negative for ALK gene translocation. Veristrat test Good. EGFR mutation studies were conflicting, initial test was negative, second test was positive for EGFR mutation in exon 18 and the the test from [facility] one was negative with positive KRAS mutation in Exon 12. 2. Stage IA malignant melanoma, status post wide excision by Dr. Allyson Sabal on Nov 12, 2008.  PRIOR THERAPY:  1) Status post right upper lobectomy with lymph node dissection under the care of Dr. Arlyce Dice on May 21, 2008.  2) Stereotactic radiotherapy to the left upper lobe lung nodule under the care of Dr. Pablo Ledger expected to be completed on 10/21/2011.  3) stereotactic radiotherapy to the left prevascular lymphadenopathy under the care of Dr. Pablo Ledger completed on 09/26/2013. 4) Tarceva 150 mg by mouth daily started 11/29/2013 discontinued today secondary to intolerance with persistent diarrhea and fatigue.  5) Tarceva 100 mg by mouth daily started on 12/27/2013. Discontinued one week after treatment because of intolerance. 6) status post thermal ablation of the left upper lobe recurrent adenocarcinoma under the care of Dr. Laurence Ferrari on 10/01/2014  CURRENT THERAPY: Observation.  Advanced directives: The patient has advanced directive and no change is requested.  INTERVAL HISTORY: [name] 76 y.o. female came to the clinic today for follow-up visit accompanied by her husband. The patient is feeling  much better today with no specific complaints except for mild fatigue. The patient denied having any chest pain, shortness of breath, cough or hemoptysis. She has no fever or chills. She denied having any significant weight loss or night sweats. She change her primary care physician to Dr. Deforest Hoyles. She had repeat CT scan of the chest performed recently and she is here for evaluation and discussion of her scan results.   MEDICAL HISTORY: Past Medical History:  Diagnosis Date  . Arthritis    back.  . Cervical ca ([place]) dx'd 1988   surg only  . CKD (chronic kidney disease), stage III   . Complication of anesthesia    difficulty awakening, dysrhythmia post surgery, (prolonged sedation level)  . Coronary artery calcification seen on CT scan   . Headache    occ. sinus type headaches  . Hx of radiation therapy 09/16/13-09/30/13   prevascular lymph node  . Hyperlipidemia   . Hypertension   . Hyperthyroidism    dr Loanne Drilling- radioactive iodine treatment 2011- resolved  . LBBB (left bundle branch block)   . Lung cancer ([place])    Bilaterally, RUL lobectomy and spot involving both lungs- "cancer"  . Macular degeneration    bilateral  . Melanoma ([place])    left leg  . Radiation 10/11/11-10/21/11   Left upper lobe adenocarcinoma  . Tremor    hand- intention tremor    ALLERGIES:  is allergic to meperidine hcl; penicillins; morphine; oxycodone-acetaminophen; propoxyphene n-acetaminophen; and tramadol hcl.  MEDICATIONS:  Current Outpatient Prescriptions  Medication Sig Dispense Refill  . amLODipine (NORVASC) 2.5 MG tablet Take 2.5 mg by mouth daily.    Marland Kitchen aspirin EC 81 MG tablet  Take 1 tablet (81 mg total) by mouth daily. 90 tablet 3  . atenolol (TENORMIN) 25 MG tablet Take 1 tablet (25 mg total) by mouth 2 (two) times daily. 180 tablet 11  . calcium-vitamin D (OSCAL WITH D) 500-200 MG-UNIT per tablet Take 1 tablet by mouth every morning.     . diltiazem (DILT-XR) 240 MG 24 hr capsule Take 1 capsule  (240 mg total) by mouth daily. 90 capsule 11  . etodolac (LODINE) 400 MG tablet Take 400 mg by mouth 2 (two) times daily.    . fluticasone (FLONASE) 50 MCG/ACT nasal spray Place into the nose.    . gabapentin (NEURONTIN) 300 MG capsule Take 300 mg by mouth at bedtime. States she takes 2 hs  2  . hydrochlorothiazide (HYDRODIURIL) 25 MG tablet Take 25 mg by mouth daily.    Marland Kitchen ketoconazole (NIZORAL) 2 % cream     . levothyroxine (SYNTHROID, LEVOTHROID) 100 MCG tablet TAKE 1 TABLET(100 MCG) BY MOUTH DAILY BEFORE BREAKFAST 30 tablet 0  . Multiple Vitamin (MULITIVITAMIN WITH MINERALS) TABS Take 1 tablet by mouth every morning.     . Multiple Vitamin (THERA) TABS Take by mouth.    . pantoprazole (PROTONIX) 40 MG tablet Take 1 tablet by mouth daily.    Marland Kitchen acetaminophen (TYLENOL) 650 MG CR tablet Take 650 mg by mouth every 8 (eight) hours as needed for pain.     No current facility-administered medications for this visit.     SURGICAL HISTORY:  Past Surgical History:  Procedure Laterality Date  . CATARACT EXTRACTION, BILATERAL Bilateral   . conization for cervical dysplasia    . esophogus stretching    . IR GENERIC HISTORICAL  09/11/2014   IR RADIOLOGIST EVAL & MGMT 09/11/2014 Jacqulynn Cadet, MD [facility] INTERV RAD  . rul lobectomy  2010  . TONSILLECTOMY      REVIEW OF SYSTEMS:  A comprehensive review of systems was negative except for: Constitutional: positive for fatigue   PHYSICAL EXAMINATION: General appearance: alert, cooperative and no distress Head: Normocephalic, without obvious abnormality, atraumatic Neck: no adenopathy Lymph nodes: Cervical, supraclavicular, and axillary nodes normal. Resp: clear to auscultation bilaterally Back: symmetric, no curvature. ROM normal. No CVA tenderness. Cardio: regular rate and rhythm, S1, S2 normal, no murmur, click, rub or gallop GI: soft, non-tender; bowel sounds normal; no masses,  no organomegaly Extremities: extremities normal, atraumatic, no  cyanosis or edema  ECOG PERFORMANCE STATUS: 1 - Symptomatic but completely ambulatory  Blood pressure (!) 162/72, pulse 76, temperature 98.1 F (36.7 C), temperature source Oral, resp. rate 18, height '5\' 4"'  (1.626 m), weight 145 lb 1.6 oz (65.8 kg), SpO2 100 %.  LABORATORY DATA: Lab Results  Component Value Date   WBC 7.9 08/24/2016   HGB 12.5 08/24/2016   HCT 38.3 08/24/2016   MCV 87.9 08/24/2016   PLT 239 08/24/2016      Chemistry      Component Value Date/Time   NA 142 08/24/2016 0926   K 4.2 08/24/2016 0926   CL 104 04/19/2016 1103   CL 105 07/17/2012 1434   CO2 28 08/24/2016 0926   BUN 21.1 08/24/2016 0926   CREATININE 1.2 (H) 08/24/2016 0926      Component Value Date/Time   CALCIUM 10.6 (H) 08/24/2016 0926   ALKPHOS 65 08/24/2016 0926   AST 15 08/24/2016 0926   ALT 16 08/24/2016 0926   BILITOT 0.57 08/24/2016 0926       RADIOGRAPHIC STUDIES: Ct Chest W Contrast  Result Date: 08/24/2016 CLINICAL DATA:  Lung cancer. EXAM: CT CHEST WITH CONTRAST TECHNIQUE: Multidetector CT imaging of the chest was performed during intravenous contrast administration. CONTRAST:  1 ISOVUE-300 IOPAMIDOL (ISOVUE-300) INJECTION 61% COMPARISON:  05/19/2016 and 07/30/2014. FINDINGS: Cardiovascular: Atherosclerotic calcification of the arterial vasculature, including coronary arteries. Heart size normal. No pericardial effusion. Mediastinum/Nodes: No pathologically enlarged mediastinal, hilar or axillary lymph nodes. Esophagus is grossly unremarkable. Small to moderate hiatal hernia. Lungs/Pleura: Biapical pleuroparenchymal scarring. Posttreatment consolidation and surrounding architectural distortion in the left upper lobe, stable. A separate nodule in the left upper lobe measures approximately 9 mm, likely stable. Solid pulmonary nodules in the left lower lobe measure up to 12 mm in the superior segment, stable from 05/19/2016 but new from 07/30/2014. Ground-glass nodule in the anterior left  lower lobe, 2.4 cm, stable. Small vague ground-glass nodules in the anterior right lower lobe are unchanged. Right upper lobectomy. No pleural fluid. Airway is otherwise unremarkable. Upper Abdomen: Visualized portions of the liver and adrenal glands are unremarkable. 2.1 cm hyperdense lesion off the upper pole right kidney is incompletely visualized but appears grossly stable in size. Subcentimeter low-attenuation lesions in the left kidney are too small to definitively characterize but cysts are most likely. Renal cortical scarring on the left. Visualized portions of the spleen, pancreas, stomach and bowel are grossly unremarkable the exception of a small to moderate hiatal hernia. No upper abdominal adenopathy. Musculoskeletal: No worrisome lytic or sclerotic lesions. IMPRESSION: 1. Posttreatment consolidation and architectural distortion in the left upper lobe, stable. Underlying recurrent disease is not excluded. 2. Solid left lower lobe pulmonary nodules are unchanged from 05/19/2016 but new from 07/30/2014 and worrisome for bronchogenic carcinoma. 3. Scattered ground-glass nodules are grossly stable. 4. Aortic atherosclerosis (ICD10-170.0). Coronary artery calcification. 5. Small to moderate hiatal hernia. 6. Hyperdense lesion off the upper pole right kidney, incompletely visualized and evaluated. Electronically Signed   By: Lorin Picket M.D.   On: 08/24/2016 12:57   ASSESSMENT AND PLAN:  This is a very pleasant 76 years old white female with recurrent non-small cell lung cancer, adenocarcinoma carcinoma and questionable EGFR mutation. She was treated with reduced dose Tarceva in the past but the patient was unable to tolerate her treatment. She underwent several local therapy by interventional radiology as well as stereotactic radiotherapy. The patient is doing fine today with no specific complaints. Her recent CT scan of the chest showed no clear evidence for disease progression. I discussed the  scan results with the patient and her husband. I recommended for her to continue on observation with repeat CT scan of the chest in 3 months. For hypertension, she will discuss with her new primary care physician and adjustment of her medication. She was advised to call immediately if she has any concerning symptoms in the interval.  All questions were answered. The patient knows to call the clinic with any problems, questions or concerns. We can certainly see the patient much sooner if necessary. I spent 10 minutes counseling the patient face to face. The total time spent in the appointment was 15 minutes.  Disclaimer: This note was dictated with voice recognition software. Similar sounding words can inadvertently be transcribed and may not be corrected upon review.

## 2016-08-31 NOTE — Telephone Encounter (Signed)
Appointments scheduled per 2/21 LOS. Patient given AVS report and calendars with future scheduled appointments. Patient aware of CT scan appointment to be scheduled. Per the patient she will receive contrast intravenously.

## 2016-09-12 DIAGNOSIS — Z1211 Encounter for screening for malignant neoplasm of colon: Secondary | ICD-10-CM | POA: Diagnosis not present

## 2016-09-12 DIAGNOSIS — Z1212 Encounter for screening for malignant neoplasm of rectum: Secondary | ICD-10-CM | POA: Diagnosis not present

## 2016-09-15 DIAGNOSIS — M8588 Other specified disorders of bone density and structure, other site: Secondary | ICD-10-CM | POA: Diagnosis not present

## 2016-09-15 DIAGNOSIS — M810 Age-related osteoporosis without current pathological fracture: Secondary | ICD-10-CM | POA: Diagnosis not present

## 2016-09-18 ENCOUNTER — Other Ambulatory Visit: Payer: Self-pay | Admitting: Cardiology

## 2016-09-18 DIAGNOSIS — E039 Hypothyroidism, unspecified: Secondary | ICD-10-CM

## 2016-09-20 NOTE — Telephone Encounter (Signed)
Okay to refill? Please advise. Thanks, MI 

## 2016-09-20 NOTE — Telephone Encounter (Signed)
Yes please refill 

## 2016-09-26 DIAGNOSIS — M810 Age-related osteoporosis without current pathological fracture: Secondary | ICD-10-CM | POA: Diagnosis not present

## 2016-09-26 DIAGNOSIS — R195 Other fecal abnormalities: Secondary | ICD-10-CM | POA: Diagnosis not present

## 2016-09-29 DIAGNOSIS — D235 Other benign neoplasm of skin of trunk: Secondary | ICD-10-CM | POA: Diagnosis not present

## 2016-09-29 DIAGNOSIS — L820 Inflamed seborrheic keratosis: Secondary | ICD-10-CM | POA: Diagnosis not present

## 2016-09-29 DIAGNOSIS — Z85820 Personal history of malignant melanoma of skin: Secondary | ICD-10-CM | POA: Diagnosis not present

## 2016-09-29 DIAGNOSIS — L821 Other seborrheic keratosis: Secondary | ICD-10-CM | POA: Diagnosis not present

## 2016-09-29 DIAGNOSIS — C44622 Squamous cell carcinoma of skin of right upper limb, including shoulder: Secondary | ICD-10-CM | POA: Diagnosis not present

## 2016-09-29 DIAGNOSIS — L570 Actinic keratosis: Secondary | ICD-10-CM | POA: Diagnosis not present

## 2016-09-29 DIAGNOSIS — L814 Other melanin hyperpigmentation: Secondary | ICD-10-CM | POA: Diagnosis not present

## 2016-09-29 DIAGNOSIS — C44602 Unspecified malignant neoplasm of skin of right upper limb, including shoulder: Secondary | ICD-10-CM | POA: Diagnosis not present

## 2016-10-06 DIAGNOSIS — R195 Other fecal abnormalities: Secondary | ICD-10-CM | POA: Diagnosis not present

## 2016-10-06 DIAGNOSIS — K219 Gastro-esophageal reflux disease without esophagitis: Secondary | ICD-10-CM | POA: Diagnosis not present

## 2016-10-06 DIAGNOSIS — C3491 Malignant neoplasm of unspecified part of right bronchus or lung: Secondary | ICD-10-CM | POA: Diagnosis not present

## 2016-10-07 ENCOUNTER — Encounter: Payer: Self-pay | Admitting: Cardiology

## 2016-10-07 ENCOUNTER — Ambulatory Visit (INDEPENDENT_AMBULATORY_CARE_PROVIDER_SITE_OTHER): Payer: Medicare Other | Admitting: Cardiology

## 2016-10-07 VITALS — BP 122/74 | HR 76 | Ht 64.0 in | Wt 146.0 lb

## 2016-10-07 DIAGNOSIS — I2510 Atherosclerotic heart disease of native coronary artery without angina pectoris: Secondary | ICD-10-CM | POA: Diagnosis not present

## 2016-10-07 DIAGNOSIS — I10 Essential (primary) hypertension: Secondary | ICD-10-CM

## 2016-10-07 DIAGNOSIS — Z789 Other specified health status: Secondary | ICD-10-CM

## 2016-10-07 DIAGNOSIS — R5383 Other fatigue: Secondary | ICD-10-CM

## 2016-10-07 DIAGNOSIS — E785 Hyperlipidemia, unspecified: Secondary | ICD-10-CM | POA: Diagnosis not present

## 2016-10-07 DIAGNOSIS — I2584 Coronary atherosclerosis due to calcified coronary lesion: Secondary | ICD-10-CM

## 2016-10-07 MED ORDER — PRAVASTATIN SODIUM 10 MG PO TABS: 10.0000 mg | ORAL_TABLET | Freq: Every day | ORAL | 2 refills | Status: DC

## 2016-10-07 NOTE — Progress Notes (Addendum)
Cardiology Office Note    Date:  10/09/2016  ID:  [name], DOB 23-Aug-1940, MRN [id number] PCP:  Jessica Low, MD  Cardiologist:  Ron Parker -> Dr. Meda [name]   Chief Complaint: SOB, fatigue  History of Present Illness:  [name] is a 76 y.o. female with history of LBBB, nonsmall cell lung cancer (s/p RULectomy 2009, radiation to LUL nodule 2013, radiation to lymphadenopathy 2015, Tarceva rx, thermal ablation of recurrent LUL adenocarcinoma 2016), arthritis, cervical CA 1988 s/p surgery, HTN, hypothyroidism, macular degeneration, melanoma of leg, intention tremor, probable CKD stage III, coronary calcification on CT who presents for f/u. Previously followed by Dr. Ron Parker - underwent cardiac catheterization in the 80s that showed no significant coronary artery disease. Carotid US 2014 showed 1-39% stenosis. Husband has metastatic kidney cancer to the brain.  She was seen in the fall for dyspnea at which time Dr. Meda [name] added amlodipine. She recommended to start statin therapy given coronary calcification noted on CT scanning as well as levothyroxine given abnormal TSH. The patient has a history of being very sensitive to medications so she started each med in a stepwise progression. At one point her HCTZ was discontinued due to elevated Cr. F/u TFTs 04/2016 wnl, LDL 147 prior to initiation of atorvastatin.  Subsequent labs 05/2016: Cr 1.1, K 4.3, albumin 2.9, Hgb 11.9, LDL 104. She did not tolerate even [name] dose amlodipine due to fatigue. Ms. Gretzinger continues to notice occasional fluctuations in her BP mostly from 400-867 systolic range. She also continues to complain of generalized fatigue and dyspnea. She underwent a nuclear stress test that showed no ischemia, scar in the septal wall,  h/o infarct, not sure if it is real or artifact. I would start with ordering an echocardiogram to evaluate for WMA and LVEF.  There have been a few nights where she wakes up from sleep feeling a sensation of something  on her chest. She says it does not feel like chest pain, just an unusual sensation. She has h/o GERD so she's not sure if that's contributing. The patient has since seen her PCP who restarted HCTZ, asked her to wean off amlodipine, started iron supp for mild anemia, and recommended she switch Pepcid to a PPI (she's unsure of the name, possibly Protonix). She has f/u with PCP in several weeks to revisit.  10/07/2016 - 3 months follow up, the patient states that she feels better, her BP has been better controlled, she denies any chest pain, DOE, no palpitations, dizziness or syncope. She stopped taking atorvastatin as that caused her muscle pains.   Past Medical History:  Diagnosis Date  . Arthritis    back.  . Cervical ca ([place]) dx'd 1988   surg only  . CKD (chronic kidney disease), stage III   . Complication of anesthesia    difficulty awakening, dysrhythmia post surgery, (prolonged sedation level)  . Coronary artery calcification seen on CT scan   . Headache    occ. sinus type headaches  . Hx of radiation therapy 09/16/13-09/30/13   prevascular lymph node  . Hyperlipidemia   . Hypertension   . Hyperthyroidism    dr Loanne Drilling- radioactive iodine treatment 2011- resolved  . LBBB (left bundle branch block)   . Lung cancer ([place])    Bilaterally, RUL lobectomy and spot involving both lungs- "cancer"  . Macular degeneration    bilateral  . Melanoma ([place])    left leg  . Radiation 10/11/11-10/21/11   Left upper lobe adenocarcinoma  . Tremor  hand- intention tremor    Past Surgical History:  Procedure Laterality Date  . CATARACT EXTRACTION, BILATERAL Bilateral   . conization for cervical dysplasia    . esophogus stretching    . IR GENERIC HISTORICAL  09/11/2014   IR RADIOLOGIST EVAL & MGMT 09/11/2014 Jessica Cadet, MD [facility] INTERV RAD  . rul lobectomy  2010  . TONSILLECTOMY      Current Medications: Current Outpatient Prescriptions  Medication Sig Dispense Refill  . acetaminophen  (TYLENOL) 650 MG CR tablet Take 650 mg by mouth every 8 (eight) hours as needed for pain.    Marland Kitchen amLODipine (NORVASC) 2.5 MG tablet Take 2.5 mg by mouth daily.    Marland Kitchen aspirin EC 81 MG tablet Take 1 tablet (81 mg total) by mouth daily. 90 tablet 3  . atenolol (TENORMIN) 25 MG tablet Take 1 tablet (25 mg total) by mouth 2 (two) times daily. 180 tablet 11  . Calcium Carbonate-Vitamin D3 (CALCIUM 600-D) 600-400 MG-UNIT TABS Take 1 tablet by mouth daily.    Marland Kitchen diltiazem (DILT-XR) 240 MG 24 hr capsule Take 1 capsule (240 mg total) by mouth daily. 90 capsule 11  . etodolac (LODINE) 400 MG tablet Take 400 mg by mouth 2 (two) times daily.    . fluticasone (FLONASE) 50 MCG/ACT nasal spray Place into the nose.    . gabapentin (NEURONTIN) 300 MG capsule Take 300 mg by mouth at bedtime.   2  . hydrochlorothiazide (HYDRODIURIL) 25 MG tablet Take 25 mg by mouth daily.    Marland Kitchen ketoconazole (NIZORAL) 2 % cream     . levothyroxine (SYNTHROID, LEVOTHROID) 100 MCG tablet TAKE 1 TABLET(100 MCG) BY MOUTH DAILY BEFORE BREAKFAST 30 tablet 9  . Multiple Vitamin (THERA) TABS Take by mouth.    . pantoprazole (PROTONIX) 40 MG tablet Take 1 tablet by mouth daily.    . pravastatin (PRAVACHOL) 10 MG tablet Take 1 tablet (10 mg total) by mouth daily. 90 tablet 2   No current facility-administered medications for this visit.      Allergies:   Meperidine hcl; Penicillins; Morphine; Oxycodone-acetaminophen; Propoxyphene n-acetaminophen; and Tramadol hcl   Social History   Social History  . Marital status: Married    Spouse name: N/A  . Number of children: 2  . Years of education: N/A   Occupational History  . homemaker Retired   Social History Main Topics  . Smoking status: Former Smoker    Packs/day: 1.00    Years: 17.00    Types: Cigarettes    Quit date: 09/28/1975  . Smokeless tobacco: Never Used     Comment: 33 yrs ago  . Alcohol use Yes     Comment: glass wine daily  . Drug use: No  . Sexual activity: Not Asked     Other Topics Concern  . None   Social History Personal assistant   Married 1963   4 grandchildren and 2 step-grandchildren   Marriage in good health            Physician Roster:   Oncologist- Dr Julien Nordmann   Surgeon- Dr Lindell Spar- Dr Mable Fill- Dr Allyson Sabal     Family History:  The patient's family history includes Asthma in her mother; Cancer in her maternal grandfather and maternal uncle; Heart disease in her mother; Thyroid disease in her daughter.   ROS:   Please see the history of present illness.  All other systems are reviewed and otherwise negative.  PHYSICAL EXAM:   VS:  BP 122/74   Pulse 76   Ht '5\' 4"'$  (1.626 m)   Wt 146 lb (66.2 kg)   BMI 25.06 kg/m   BMI: Body mass index is 25.06 kg/m. GEN: Well nourished, well developed WF, in no acute distress  HEENT: normocephalic, atraumatic Neck: no JVD, carotid bruits, or masses Cardiac: RRR; no murmurs, rubs, or gallops, no edema  Respiratory:  clear to auscultation bilaterally, normal work of breathing GI: soft, nontender, nondistended, + BS MS: no deformity or atrophy  Skin: warm and dry, no rash Neuro:  Alert and Oriented x 3, Strength and sensation are intact, follows commands Psych: euthymic mood, full affect  Wt Readings from Last 3 Encounters:  10/07/16 146 lb (66.2 kg)  08/31/16 145 lb 1.6 oz (65.8 kg)  07/14/16 146 lb (66.2 kg)      Studies/Labs Reviewed:   EKG:  EKG was ordered today and personally reviewed by me and demonstrates NSR 76bpm, left axis deviation, left bundle branch block, nonspecific changes from prior  Recent Labs: 03/11/2016: Magnesium 2.0 04/12/2016: TSH 1.23 08/24/2016: ALT 16; BUN 21.1; Creatinine 1.2; HGB 12.5; Platelets 239; Potassium 4.2; Sodium 142   Lipid Panel    Component Value Date/Time   CHOL 184 04/12/2016 0841   TRIG 193 (H) 04/12/2016 0841   HDL 41 (L) 04/12/2016 0841   CHOLHDL 4.5 04/12/2016 0841   VLDL 39 (H) 04/12/2016 0841   LDLCALC 104  04/12/2016 0841   LDLDIRECT 137.6 05/17/2013 0937    Additional studies/ records that were reviewed today include: Summarized above.    ASSESSMENT & PLAN:   1. Chest discomfort/fatigue/dyspnea with known coronary calcium on CT - She underwent a nuclear stress test that showed no ischemia, scar in the septal wall,  Diff dg included infarct vs artifact. Echocardiogram showed LVEF is approximately 55% with septal hypokinesis. 2. Hypertension - controlled 3. Hyperlipidemia - intolerant to atorvastatin, start pravastatin 10 mg po daily.  Disposition: F/u with Dr. Meda [name] in 6 months  Medication Adjustments/Labs and Tests Ordered: Current medicines are reviewed at length with the patient today.  Concerns regarding medicines are outlined above. Medication changes, Labs and Tests ordered today are summarized above and listed in the Patient Instructions accessible in Encounters.   Signed, Ena Dawley, MD 10/09/2016 2:26 PM    [address], [place], [place]  [address] Phone: [phone]; Fax: [phone]

## 2016-10-07 NOTE — Patient Instructions (Signed)
Medication Instructions:   START TAKING PRAVASTATIN 10 MG ONCE DAILY     Follow-Up:  Your physician wants you to follow-up in: [place] will receive a reminder letter in the mail two months in advance. If you don't receive a letter, please call our office to schedule the follow-up appointment.        If you need a refill on your cardiac medications before your next appointment, please call your pharmacy.

## 2016-10-12 ENCOUNTER — Other Ambulatory Visit: Payer: Self-pay | Admitting: Cardiology

## 2016-10-12 DIAGNOSIS — H02055 Trichiasis without entropian left lower eyelid: Secondary | ICD-10-CM | POA: Diagnosis not present

## 2016-10-20 DIAGNOSIS — M7062 Trochanteric bursitis, left hip: Secondary | ICD-10-CM | POA: Diagnosis not present

## 2016-10-20 DIAGNOSIS — M5136 Other intervertebral disc degeneration, lumbar region: Secondary | ICD-10-CM | POA: Diagnosis not present

## 2016-10-20 DIAGNOSIS — M791 Myalgia: Secondary | ICD-10-CM | POA: Diagnosis not present

## 2016-10-21 ENCOUNTER — Other Ambulatory Visit: Payer: Self-pay | Admitting: Gastroenterology

## 2016-10-24 ENCOUNTER — Encounter (HOSPITAL_COMMUNITY): Payer: Self-pay | Admitting: *Deleted

## 2016-10-27 ENCOUNTER — Ambulatory Visit (HOSPITAL_COMMUNITY): Payer: Medicare Other | Admitting: Anesthesiology

## 2016-10-27 ENCOUNTER — Ambulatory Visit (HOSPITAL_COMMUNITY)
Admission: RE | Admit: 2016-10-27 | Discharge: 2016-10-27 | Disposition: A | Discharge status: Home / Self Care | Payer: Medicare Other | Source: Ambulatory Visit | Attending: Gastroenterology | Admitting: Gastroenterology

## 2016-10-27 ENCOUNTER — Encounter (HOSPITAL_COMMUNITY)
Admission: RE | Disposition: A | Discharge status: Home / Self Care | Payer: Self-pay | Source: Ambulatory Visit | Attending: Gastroenterology

## 2016-10-27 ENCOUNTER — Encounter (HOSPITAL_COMMUNITY): Payer: Self-pay | Admitting: *Deleted

## 2016-10-27 DIAGNOSIS — N179 Acute kidney failure, unspecified: Secondary | ICD-10-CM | POA: Insufficient documentation

## 2016-10-27 DIAGNOSIS — K219 Gastro-esophageal reflux disease without esophagitis: Secondary | ICD-10-CM | POA: Insufficient documentation

## 2016-10-27 DIAGNOSIS — Z801 Family history of malignant neoplasm of trachea, bronchus and lung: Secondary | ICD-10-CM | POA: Insufficient documentation

## 2016-10-27 DIAGNOSIS — Z923 Personal history of irradiation: Secondary | ICD-10-CM | POA: Insufficient documentation

## 2016-10-27 DIAGNOSIS — I129 Hypertensive chronic kidney disease with stage 1 through stage 4 chronic kidney disease, or unspecified chronic kidney disease: Secondary | ICD-10-CM | POA: Diagnosis not present

## 2016-10-27 DIAGNOSIS — N183 Chronic kidney disease, stage 3 (moderate): Secondary | ICD-10-CM | POA: Diagnosis not present

## 2016-10-27 DIAGNOSIS — J309 Allergic rhinitis, unspecified: Secondary | ICD-10-CM | POA: Diagnosis not present

## 2016-10-27 DIAGNOSIS — R195 Other fecal abnormalities: Secondary | ICD-10-CM | POA: Insufficient documentation

## 2016-10-27 DIAGNOSIS — Z7982 Long term (current) use of aspirin: Secondary | ICD-10-CM | POA: Insufficient documentation

## 2016-10-27 DIAGNOSIS — K633 Ulcer of intestine: Secondary | ICD-10-CM | POA: Diagnosis not present

## 2016-10-27 DIAGNOSIS — K648 Other hemorrhoids: Secondary | ICD-10-CM | POA: Insufficient documentation

## 2016-10-27 DIAGNOSIS — Z79899 Other long term (current) drug therapy: Secondary | ICD-10-CM | POA: Insufficient documentation

## 2016-10-27 DIAGNOSIS — Z885 Allergy status to narcotic agent status: Secondary | ICD-10-CM | POA: Insufficient documentation

## 2016-10-27 DIAGNOSIS — I447 Left bundle-branch block, unspecified: Secondary | ICD-10-CM | POA: Diagnosis not present

## 2016-10-27 DIAGNOSIS — Z85118 Personal history of other malignant neoplasm of bronchus and lung: Secondary | ICD-10-CM | POA: Insufficient documentation

## 2016-10-27 DIAGNOSIS — E0500 Thyrotoxicosis with diffuse goiter without thyrotoxic crisis or storm: Secondary | ICD-10-CM | POA: Diagnosis not present

## 2016-10-27 DIAGNOSIS — Z1211 Encounter for screening for malignant neoplasm of colon: Secondary | ICD-10-CM | POA: Diagnosis not present

## 2016-10-27 DIAGNOSIS — Z881 Allergy status to other antibiotic agents status: Secondary | ICD-10-CM | POA: Insufficient documentation

## 2016-10-27 DIAGNOSIS — Z85820 Personal history of malignant melanoma of skin: Secondary | ICD-10-CM | POA: Insufficient documentation

## 2016-10-27 DIAGNOSIS — Z902 Acquired absence of lung [part of]: Secondary | ICD-10-CM | POA: Diagnosis not present

## 2016-10-27 DIAGNOSIS — Z8249 Family history of ischemic heart disease and other diseases of the circulatory system: Secondary | ICD-10-CM | POA: Insufficient documentation

## 2016-10-27 DIAGNOSIS — Z87891 Personal history of nicotine dependence: Secondary | ICD-10-CM | POA: Diagnosis not present

## 2016-10-27 DIAGNOSIS — E039 Hypothyroidism, unspecified: Secondary | ICD-10-CM | POA: Diagnosis not present

## 2016-10-27 DIAGNOSIS — E785 Hyperlipidemia, unspecified: Secondary | ICD-10-CM | POA: Diagnosis not present

## 2016-10-27 HISTORY — PX: COLONOSCOPY WITH PROPOFOL: SHX5780

## 2016-10-27 HISTORY — DX: Hypothyroidism, unspecified: E03.9

## 2016-10-27 SURGERY — COLONOSCOPY WITH PROPOFOL
Anesthesia: Monitor Anesthesia Care

## 2016-10-27 MED ORDER — LIDOCAINE 2% (20 MG/ML) 5 ML SYRINGE
INTRAMUSCULAR | Status: AC
  Filled 2016-10-27: qty 5

## 2016-10-27 MED ORDER — SODIUM CHLORIDE 0.9 % IV SOLN: INTRAVENOUS | Status: DC

## 2016-10-27 MED ORDER — SPOT INK MARKER SYRINGE KIT
PACK | SUBMUCOSAL | Status: AC
  Filled 2016-10-27: qty 5

## 2016-10-27 MED ORDER — ONDANSETRON HCL 4 MG/2ML IJ SOLN
INTRAMUSCULAR | Status: AC
  Filled 2016-10-27: qty 2

## 2016-10-27 MED ORDER — PROPOFOL 10 MG/ML IV BOLUS
INTRAVENOUS | Status: AC
  Filled 2016-10-27: qty 40

## 2016-10-27 MED ORDER — ONDANSETRON HCL 4 MG/2ML IJ SOLN
INTRAMUSCULAR | Status: DC | PRN
  Administered 2016-10-27: 4 mg via INTRAVENOUS

## 2016-10-27 MED ORDER — PROPOFOL 10 MG/ML IV BOLUS
INTRAVENOUS | Status: AC
  Filled 2016-10-27: qty 20

## 2016-10-27 MED ORDER — PROPOFOL 10 MG/ML IV BOLUS
INTRAVENOUS | Status: DC | PRN
  Administered 2016-10-27 (×5): 40 mg via INTRAVENOUS
  Administered 2016-10-27: 20 mg via INTRAVENOUS
  Administered 2016-10-27 (×2): 40 mg via INTRAVENOUS
  Administered 2016-10-27: 20 mg via INTRAVENOUS
  Administered 2016-10-27 (×4): 40 mg via INTRAVENOUS
  Administered 2016-10-27: 20 mg via INTRAVENOUS
  Administered 2016-10-27 (×2): 40 mg via INTRAVENOUS

## 2016-10-27 MED ORDER — LACTATED RINGERS IV SOLN
INTRAVENOUS | Status: DC
  Administered 2016-10-27 (×2): via INTRAVENOUS

## 2016-10-27 MED ORDER — LIDOCAINE 2% (20 MG/ML) 5 ML SYRINGE
INTRAMUSCULAR | Status: DC | PRN
  Administered 2016-10-27: 40 mg via INTRAVENOUS

## 2016-10-27 SURGICAL SUPPLY — 21 items

## 2016-10-27 NOTE — Transfer of Care (Signed)
Immediate Anesthesia Transfer of Care Note  Patient: [name]  Procedure(s) Performed: Procedure(s): COLONOSCOPY WITH PROPOFOL (N/A)  Patient Location: PACU and Endoscopy Unit  Anesthesia Type:MAC  Level of Consciousness: sedated  Airway & Oxygen Therapy: Patient Spontanous Breathing and Patient connected to face mask oxygen  Post-op Assessment: Report given to RN and Post -op Vital signs reviewed and stable  Post vital signs: Reviewed and stable  Last Vitals:  Vitals:   10/27/16 1313  BP: (!) 180/57  Pulse: 68  Resp: 12  Temp: 36.4 C    Last Pain:  Vitals:   10/27/16 1313  TempSrc: Oral         Complications: No apparent anesthesia complications

## 2016-10-27 NOTE — Anesthesia Postprocedure Evaluation (Addendum)
Anesthesia Post Note  Patient: [name]  Procedure(s) Performed: Procedure(s) (LRB): COLONOSCOPY WITH PROPOFOL (N/A)  Patient location during evaluation: PACU Anesthesia Type: MAC Level of consciousness: awake and alert Pain management: pain level controlled Vital Signs Assessment: post-procedure vital signs reviewed and stable Respiratory status: spontaneous breathing and respiratory function stable Cardiovascular status: stable Anesthetic complications: no       Last Vitals:  Vitals:   10/27/16 1550 10/27/16 1600  BP: (!) 173/99 (!) 192/65  Pulse: 80 77  Resp: 15 12  Temp:      Last Pain:  Vitals:   10/27/16 1313  TempSrc: Oral                 Ziah Turvey DANIEL

## 2016-10-27 NOTE — H&P (Signed)
Subjective:   Patient is a 76 y.o. female presents with Positive Cologuard test. This test was done because she has a history of non-small cell lung cancer and underwent left upper lobe lobectomy in 2009 apparently has developed more spots in her lung has been followed by Dr. Earlie Server. His had targeted radiation on several occasions and RSA on one of the lung lesions. She apparently does not have much in the way of pulmonary compromise but does get somewhat short of breath with overexertion. She never has had a colonoscopy and did have the positive Cologuard test by her PCP. Procedure including risks and benefits discussed in office.  Patient Active Problem List   Diagnosis Date Noted  . Elevated serum creatinine 04/15/2016  . Acute kidney injury superimposed on chronic kidney disease ([place]) 03/24/2016  . Coronary artery calcification seen on CT scan   . CKD (chronic kidney disease), stage III   . Hypertension   . Hypothyroidism 03/15/2016  . Hyperlipidemia 08/19/2015  . Cancer of upper lobe of left lung ([place])   . Recurrent lung adenocarcinoma ([place]) 10/01/2014  . Local recurrence of left lung cancer ([place])   . Carotid artery disease without cerebral infarction ([place]) 05/20/2013  . Atherosclerosis of aorta ([place]) 05/10/2013  . Macular degeneration 05/10/2013  . Allergic rhinitis 10/20/2012  . Cancer of upper lobe of lung ([place]) 09/28/2011  . Melanoma ([place])   . LBBB (left bundle branch block)   . Gastritis due to nonsteroidal anti-inflammatory drug 06/20/2011  . Hyperthyroidism   . Tremor   . Abdominal pain, RUQ 02/10/2011  . GERD with stricture 02/08/2011  . GOITER, MULTINODULAR 09/25/2009  . URI 09/02/2009  . ESSENTIAL HYPERTENSION, BENIGN 12/01/2008   Past Medical History:  Diagnosis Date  . Arthritis    back.and feet  . Cervical ca ([place]) dx'd 1988   surg only  . CKD (chronic kidney disease), stage III   . Complication of anesthesia    difficulty awakening, dysrhythmia post  surgery, (prolonged sedation level)  . Coronary artery calcification seen on CT scan   . Headache    occ. sinus type headaches  . Hx of radiation therapy 09/16/13-09/30/13   prevascular lymph node  . Hyperlipidemia   . Hypertension   . Hyperthyroidism    dr Loanne Drilling- radioactive iodine treatment 2011- resolved  . Hypothyroidism   . LBBB (left bundle branch block)   . Lung cancer ([place])    Bilaterally, RUL lobectomy and spot involving both lungs- "cancer"  . Macular degeneration    bilateral  . Melanoma ([place])    left leg  . Radiation 10/11/11-10/21/11   Left upper lobe adenocarcinoma  . Tremor    hand- intention tremor    Past Surgical History:  Procedure Laterality Date  . basal cell and squamous cell skin cell area removed     new left lower eye lid done with skin graft   . CATARACT EXTRACTION, BILATERAL Bilateral   . conization for cervical dysplasia    . esophogus stretching    . IR GENERIC HISTORICAL  09/11/2014   IR RADIOLOGIST EVAL & MGMT 09/11/2014 Jacqulynn Cadet, MD [facility] INTERV RAD  . radio frequency ablation on lung spot    . rul lobectomy  2010  . TONSILLECTOMY      Prescriptions Prior to Admission  Medication Sig Dispense Refill Last Dose  . acetaminophen (TYLENOL) 650 MG CR tablet Take 1,300 mg by mouth 2 (two) times daily.    10/26/2016 at Unknown time  .  aspirin EC 81 MG tablet Take 1 tablet (81 mg total) by mouth daily. 90 tablet 3 10/26/2016 at Unknown time  . atenolol (TENORMIN) 25 MG tablet TAKE 1 TABLET(25 MG) BY MOUTH TWICE DAILY 180 tablet 3 10/27/2016 at Unknown time  . Calcium Carbonate-Vitamin D3 (CALCIUM 600-D) 600-400 MG-UNIT TABS Take 2 tablets by mouth daily.    10/26/2016 at Unknown time  . diltiazem (DILT-XR) 240 MG 24 hr capsule Take 1 capsule (240 mg total) by mouth daily. 90 capsule 11 10/27/2016 at Unknown time  . etodolac (LODINE) 400 MG tablet Take 400 mg by mouth 2 (two) times daily.   10/26/2016 at Unknown time  . gabapentin (NEURONTIN) 300 MG  capsule Take 300 mg by mouth 2 (two) times daily.   2 10/27/2016 at Unknown time  . hydrochlorothiazide (HYDRODIURIL) 25 MG tablet Take 25 mg by mouth daily.   10/26/2016 at Unknown time  . levothyroxine (SYNTHROID, LEVOTHROID) 100 MCG tablet TAKE 1 TABLET(100 MCG) BY MOUTH DAILY BEFORE BREAKFAST 30 tablet 9 10/27/2016 at Unknown time  . Multiple Vitamin (MULTIVITAMIN WITH MINERALS) TABS tablet Take 1 tablet by mouth daily.   10/26/2016 at Unknown time  . Multiple Vitamins-Minerals (PRESERVISION AREDS 2 PO) Take 1 capsule by mouth 2 (two) times daily.   10/26/2016 at Unknown time  . pantoprazole (PROTONIX) 40 MG tablet Take 40 mg by mouth daily.    10/27/2016 at Unknown time  . pravastatin (PRAVACHOL) 10 MG tablet Take 1 tablet (10 mg total) by mouth daily. 90 tablet 2 10/27/2016 at Unknown time  . amLODipine (NORVASC) 2.5 MG tablet Take 2.5 mg by mouth daily.   Taking  . Ferrous Sulfate (IRON) 325 (65 Fe) MG TABS Take 325 mg by mouth 2 (two) times daily.   10/21/2016  . fluticasone (FLONASE) 50 MCG/ACT nasal spray Place 1 spray into both nostrils daily as needed for allergies.    More than a month at Unknown time   Allergies  Allergen Reactions  . Meperidine Hcl Other (See Comments)    "knocked pt out for 4 days"  . Penicillins Anaphylaxis, Shortness Of Breath and Other (See Comments)    Difficulty breathing  . Morphine Other (See Comments)    Stopped breathing due to too high of a dose per patient.  Not an allergic reaction to morphine  . Oxycodone-Acetaminophen Itching       . Propoxyphene N-Acetaminophen Nausea And Vomiting  . Tramadol Hcl Nausea And Vomiting    Social History  Substance Use Topics  . Smoking status: Former Smoker    Packs/day: 1.00    Years: 17.00    Types: Cigarettes    Quit date: 09/28/1975  . Smokeless tobacco: Never Used     Comment: 33 yrs ago  . Alcohol use Yes     Comment: glass wine daily    Family History  Problem Relation Age of Onset  . Asthma Mother    . Heart disease Mother   . Thyroid disease Daughter     hypothyroidism  . Cancer Maternal Uncle     lung  . Cancer Maternal Grandfather     lung  . Diabetes Neg Hx   . Coronary artery disease Neg Hx      Objective:   Patient Vitals for the past 8 hrs:  BP Temp Temp src Pulse Resp SpO2 Height Weight  10/27/16 1313 (!) 180/57 97.6 F (36.4 C) Oral 68 12 99 % '5\' 4"'$  (1.626 m) 66.2 kg (146 lb)  No intake/output data recorded. No intake/output data recorded.   See MD Preop evaluation      Assessment:   1. Positive Cologuard test 2. Non-resectable non-small cell lung cancer  Plan:   We will go ahead with colonoscopy. Have discussed this in detail with the patient and her husband in the office.

## 2016-10-27 NOTE — Anesthesia Preprocedure Evaluation (Addendum)
Anesthesia Evaluation  Patient identified by MRN, date of birth, ID band Patient awake    Reviewed: Allergy & Precautions, NPO status , Patient's Chart, lab work & pertinent test results  Airway Mallampati: II  TM Distance: >3 FB Neck ROM: Full    Dental no notable dental hx. (+) Dental Advisory Given   Pulmonary former smoker,  Hx of Lung ca   breath sounds clear to auscultation       Cardiovascular hypertension, + CAD and + Peripheral Vascular Disease  + dysrhythmias  Rhythm:Regular Rate:Normal     Neuro/Psych    GI/Hepatic GERD  ,  Endo/Other  Hypothyroidism   Renal/GU Renal InsufficiencyRenal disease     Musculoskeletal  (+) Arthritis ,   Abdominal   Peds  Hematology   Anesthesia Other Findings   Reproductive/Obstetrics                            Anesthesia Physical Anesthesia Plan  ASA: III  Anesthesia Plan: MAC   Post-op Pain Management:    Induction: Intravenous  Airway Management Planned: Natural Airway and Nasal Cannula  Additional Equipment:   Intra-op Plan:   Post-operative Plan:   Informed Consent: I have reviewed the patients History and Physical, chart, labs and discussed the procedure including the risks, benefits and alternatives for the proposed anesthesia with the patient or authorized representative who has indicated his/her understanding and acceptance.   Dental advisory given  Plan Discussed with: CRNA  Anesthesia Plan Comments:         Anesthesia Quick Evaluation

## 2016-10-27 NOTE — Discharge Instructions (Signed)
Resume diet and previous medicines.  If biopsies were taken the office will notify you by letter or phone in about 10 days.

## 2016-10-27 NOTE — Op Note (Signed)
[facility] Patient Name: [name] Procedure Date: 10/27/2016 MRN: [id number] Attending MD: Nancy Fetter Dr., MD Date of Birth: 1941-05-01 CSN: [id number] Age: 76 Admit Type: Outpatient Procedure:                Colonoscopy with biopsy and tattoo Indications:              This is the patient's first colonoscopy, Positive                            Cologuard test Providers:                Jeneen Rinks L. Xyla Leisner Dr., MD, Hilma Favors, RN, Cletis [place], Technician, Tinnie Gens, Technician Referring MD:             Dr. Lysle Rubens Medicines:                Monitored Anesthesia Care Complications:            No immediate complications. Estimated Blood Loss:     Estimated blood loss: none. Procedure:                Pre-Anesthesia Assessment:                           - Prior to the procedure, a History and Physical                            was performed, and patient medications and                            allergies were reviewed. The patient's tolerance of                            previous anesthesia was also reviewed. The risks                            and benefits of the procedure and the sedation                            options and risks were discussed with the patient.                            All questions were answered, and informed consent                            was obtained. Prior Anticoagulants: The patient has                            taken no previous anticoagulant or antiplatelet                            agents. ASA Grade Assessment: III - A patient with  severe systemic disease. After reviewing the risks                            and benefits, the patient was deemed in                            satisfactory condition to undergo the procedure.                           After obtaining informed consent, the colonoscope                            was passed under direct vision. Throughout the                             procedure, the patient's blood pressure, pulse, and                            oxygen saturations were monitored continuously. The                            EC-[id number] ([id number]) scope was introduced through                            the anus and advanced to the the cecum, identified                            by the appendiceal orifice. The colonoscopy was                            technically difficult and complex due to                            significant looping and a tortuous colon.                            Successful completion of the procedure was aided by                            applying abdominal pressure. The patient tolerated                            the procedure well. The quality of the bowel                            preparation was fair. The appendiceal orifice and                            the rectum were photographed. Scope In: 2:48:42 PM Scope Out: 3:30:29 PM Total Procedure Duration: 0 hours 41 minutes 47 seconds  Findings:      A single (solitary) 10-20 mm ulcer was found in the cecum. No bleeding       was present. Biopsies were taken with a cold forceps for histology, jar  1. this was a linear ulcer that was in the vicinity of the ileocecal       valve along the fold. It was somewhat narrowed there. It did not appear       to be a malignant ulcer. Area was tattooed with an injection of Spot       (carbon black).      A single (solitary) 10-20 mm ulcer was found in the distal ascending       colon. No bleeding was present. Biopsies were taken with a cold forceps       for histology. this was very similar to the ulcerative cecum and was a       linear ulcer along the top of the fold. Jar 2.      Non-bleeding internal hemorrhoids were found during retroflexion. The       hemorrhoids were medium-sized and Grade I (internal hemorrhoids that do       not prolapse).      There is no endoscopic evidence of mass, polyps or tumor  in the entire       colon. Impression:               - Preparation of the colon was fair.                           - A single (solitary) ulcer in the cecum. Biopsied.                            Tattooed.                           - A single (solitary) ulcer in the distal ascending                            colon. Biopsied.                           - Non-bleeding internal hemorrhoids.                           - positive Cologuard test. This can be positive                            either from DNA from polyps or positive fromblood                            in the stool. Suspect the ulcerations in the colon                            caused the positive test. Moderate Sedation:      MAC by anesthesia Recommendation:           - Patient has a contact number available for                            emergencies. The signs and symptoms of potential                            delayed complications  were discussed with the                            patient. Return to normal activities tomorrow.                            Written discharge instructions were provided to the                            patient.                           - Resume previous diet.                           - Continue present medications.                           - No repeat colonoscopy.                           - Return to endoscopist in 2 months. Procedure Code(s):        --- Professional ---                           [phone], Colonoscopy, flexible; with directed                            submucosal injection(s), any substance                           [id number], Colonoscopy, flexible; with biopsy, single                            or multiple Diagnosis Code(s):        --- Professional ---                           K63.3, Ulcer of intestine                           K64.0, First degree hemorrhoids                           R19.5, Other fecal abnormalities CPT copyright 2016 [employer]. All  rights reserved. The codes documented in this report are preliminary and upon coder review may  be revised to meet current compliance requirements. Nancy Fetter Dr., MD 10/27/2016 3:42:24 PM This report has been signed electronically. Number of Addenda: 0

## 2016-10-28 ENCOUNTER — Encounter (HOSPITAL_COMMUNITY): Payer: Self-pay | Admitting: Gastroenterology

## 2016-11-04 DIAGNOSIS — M4726 Other spondylosis with radiculopathy, lumbar region: Secondary | ICD-10-CM | POA: Diagnosis not present

## 2016-11-04 DIAGNOSIS — M7062 Trochanteric bursitis, left hip: Secondary | ICD-10-CM | POA: Diagnosis not present

## 2016-11-04 DIAGNOSIS — M5126 Other intervertebral disc displacement, lumbar region: Secondary | ICD-10-CM | POA: Diagnosis not present

## 2016-11-04 DIAGNOSIS — M545 Low back pain: Secondary | ICD-10-CM | POA: Diagnosis not present

## 2016-11-19 ENCOUNTER — Other Ambulatory Visit: Payer: Self-pay | Admitting: Cardiology

## 2016-11-21 DIAGNOSIS — E7800 Pure hypercholesterolemia, unspecified: Secondary | ICD-10-CM | POA: Diagnosis not present

## 2016-11-21 DIAGNOSIS — E039 Hypothyroidism, unspecified: Secondary | ICD-10-CM | POA: Diagnosis not present

## 2016-11-21 DIAGNOSIS — K219 Gastro-esophageal reflux disease without esophagitis: Secondary | ICD-10-CM | POA: Diagnosis not present

## 2016-11-21 DIAGNOSIS — C3491 Malignant neoplasm of unspecified part of right bronchus or lung: Secondary | ICD-10-CM | POA: Diagnosis not present

## 2016-11-21 DIAGNOSIS — N183 Chronic kidney disease, stage 3 (moderate): Secondary | ICD-10-CM | POA: Diagnosis not present

## 2016-11-21 DIAGNOSIS — I10 Essential (primary) hypertension: Secondary | ICD-10-CM | POA: Diagnosis not present

## 2016-11-29 ENCOUNTER — Other Ambulatory Visit: Payer: Medicare Other

## 2016-11-30 ENCOUNTER — Ambulatory Visit (HOSPITAL_COMMUNITY)
Admission: RE | Admit: 2016-11-30 | Discharge: 2016-11-30 | Disposition: A | Discharge status: Home / Self Care | Payer: Medicare Other | Source: Ambulatory Visit | Attending: Internal Medicine | Admitting: Internal Medicine

## 2016-11-30 ENCOUNTER — Other Ambulatory Visit (HOSPITAL_BASED_OUTPATIENT_CLINIC_OR_DEPARTMENT_OTHER): Payer: Medicare Other

## 2016-11-30 ENCOUNTER — Encounter (HOSPITAL_COMMUNITY): Payer: Self-pay

## 2016-11-30 DIAGNOSIS — K449 Diaphragmatic hernia without obstruction or gangrene: Secondary | ICD-10-CM | POA: Insufficient documentation

## 2016-11-30 DIAGNOSIS — C3412 Malignant neoplasm of upper lobe, left bronchus or lung: Secondary | ICD-10-CM | POA: Diagnosis not present

## 2016-11-30 DIAGNOSIS — I2510 Atherosclerotic heart disease of native coronary artery without angina pectoris: Secondary | ICD-10-CM | POA: Diagnosis not present

## 2016-11-30 DIAGNOSIS — I10 Essential (primary) hypertension: Secondary | ICD-10-CM

## 2016-11-30 DIAGNOSIS — R911 Solitary pulmonary nodule: Secondary | ICD-10-CM | POA: Diagnosis not present

## 2016-11-30 DIAGNOSIS — J439 Emphysema, unspecified: Secondary | ICD-10-CM | POA: Diagnosis not present

## 2016-11-30 DIAGNOSIS — I700 Atherosclerosis of aorta: Secondary | ICD-10-CM | POA: Insufficient documentation

## 2016-11-30 LAB — CBC WITH DIFFERENTIAL/PLATELET
BASO%: 0.7 % (ref 0.0–2.0)
Basophils Absolute: 0.1 10*3/uL (ref 0.0–0.1)
EOS%: 5.1 % (ref 0.0–7.0)
Eosinophils Absolute: 0.4 10*3/uL (ref 0.0–0.5)
HCT: 43 % (ref 34.8–46.6)
HEMOGLOBIN: 13.7 g/dL (ref 11.6–15.9)
LYMPH#: 2 10*3/uL (ref 0.9–3.3)
LYMPH%: 26.1 % (ref 14.0–49.7)
MCH: 30.2 pg (ref 25.1–34.0)
MCHC: 31.9 g/dL (ref 31.5–36.0)
MCV: 94.7 fL (ref 79.5–101.0)
MONO#: 1.1 10*3/uL — ABNORMAL HIGH (ref 0.1–0.9)
MONO%: 14.3 % — ABNORMAL HIGH (ref 0.0–14.0)
NEUT%: 53.8 % (ref 38.4–76.8)
NEUTROS ABS: 4.1 10*3/uL (ref 1.5–6.5)
PLATELETS: 242 10*3/uL (ref 145–400)
RBC: 4.54 10*6/uL (ref 3.70–5.45)
RDW: 14.7 % — AB (ref 11.2–14.5)
WBC: 7.7 10*3/uL (ref 3.9–10.3)

## 2016-11-30 LAB — COMPREHENSIVE METABOLIC PANEL
ALBUMIN: 3.7 g/dL (ref 3.5–5.0)
ALT: 21 U/L (ref 0–55)
ANION GAP: 11 meq/L (ref 3–11)
AST: 18 U/L (ref 5–34)
Alkaline Phosphatase: 75 U/L (ref 40–150)
BUN: 32.1 mg/dL — AB (ref 7.0–26.0)
CO2: 32 meq/L — AB (ref 22–29)
CREATININE: 1.5 mg/dL — AB (ref 0.6–1.1)
Calcium: 10.3 mg/dL (ref 8.4–10.4)
Chloride: 102 mEq/L (ref 98–109)
EGFR: 33 mL/min/{1.73_m2} — ABNORMAL LOW (ref >90)
GLUCOSE: 73 mg/dL (ref 70–140)
Potassium: 4 mEq/L (ref 3.5–5.1)
Sodium: 144 mEq/L (ref 136–145)
TOTAL PROTEIN: 6.2 g/dL — AB (ref 6.4–8.3)
Total Bilirubin: 0.54 mg/dL (ref 0.20–1.20)

## 2016-11-30 MED ORDER — IOPAMIDOL (ISOVUE-300) INJECTION 61%
INTRAVENOUS | Status: AC
  Administered 2016-11-30: 60 mL
  Filled 2016-11-30: qty 75

## 2016-12-01 ENCOUNTER — Encounter: Payer: Self-pay | Admitting: Internal Medicine

## 2016-12-01 ENCOUNTER — Ambulatory Visit (HOSPITAL_BASED_OUTPATIENT_CLINIC_OR_DEPARTMENT_OTHER): Payer: Medicare Other | Admitting: Internal Medicine

## 2016-12-01 ENCOUNTER — Telehealth: Payer: Self-pay | Admitting: Internal Medicine

## 2016-12-01 VITALS — BP 155/72 | HR 71 | Temp 97.9°F | Resp 18 | Ht 64.0 in | Wt 144.4 lb

## 2016-12-01 DIAGNOSIS — C3412 Malignant neoplasm of upper lobe, left bronchus or lung: Secondary | ICD-10-CM | POA: Diagnosis not present

## 2016-12-01 DIAGNOSIS — C3492 Malignant neoplasm of unspecified part of left bronchus or lung: Secondary | ICD-10-CM

## 2016-12-01 NOTE — Telephone Encounter (Signed)
Appointments scheduled per 12/01/16 los. Patient was given a copy of the AVS report and appointment schedule, per 12/01/16 los.

## 2016-12-01 NOTE — Progress Notes (Signed)
[place] Telephone:([phone]) [phone]   [phone]) [phone]  OFFICE PROGRESS NOTE  Wenda Low, MD [address] [place] [address]  PRINCIPAL DIAGNOSES:  1. 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 The patient presented at that time with a right upper lobe lesion, as well as suspicious ground-glass opacities in the left lung. The tumor was negative for EGFR mutation and negative for ALK gene translocation. Veristrat test Good. EGFR mutation studies were conflicting, initial test was negative, second test was positive for EGFR mutation in exon 18 and the the test from [facility] one was negative with positive KRAS mutation in Exon 12. 2. Stage IA malignant melanoma, status post wide excision by Dr. Allyson Sabal on Nov 12, 2008.  PRIOR THERAPY:  1) Status post right upper lobectomy with lymph node dissection under the care of Dr. Arlyce Dice on May 21, 2008.  2) Stereotactic radiotherapy to the left upper lobe lung nodule under the care of Dr. Pablo Ledger expected to be completed on 10/21/2011.  3) stereotactic radiotherapy to the left prevascular lymphadenopathy under the care of Dr. Pablo Ledger completed on 09/26/2013. 4) Tarceva 150 mg by mouth daily started 11/29/2013 discontinued today secondary to intolerance with persistent diarrhea and fatigue.  5) Tarceva 100 mg by mouth daily started on 12/27/2013. Discontinued one week after treatment because of intolerance. 6) status post thermal ablation of the left upper lobe recurrent adenocarcinoma under the care of Dr. Laurence Ferrari on 10/01/2014  CURRENT THERAPY: Observation.  Advanced directives: The patient has advanced directive and no change is requested.  INTERVAL HISTORY: [name] 76 y.o. female returns to the clinic today for follow-up visit. The patient is feeling fine today with no  specific complaints except for mild fatigue. She is under a lot of stress taking care of her husband. She denied having any chest pain, shortness of breath, cough or hemoptysis. She has no nausea, vomiting, diarrhea or constipation. She denied having any weight loss or night sweats. She had repeat CT scan of the chest performed recently and she is here for evaluation and discussion of her scan results.   MEDICAL HISTORY: Past Medical History:  Diagnosis Date  . Arthritis    back.and feet  . Cervical ca ([place]) dx'd 1988   surg only  . CKD (chronic kidney disease), stage III   . Complication of anesthesia    difficulty awakening, dysrhythmia post surgery, (prolonged sedation level)  . Coronary artery calcification seen on CT scan   . Headache    occ. sinus type headaches  . Hx of radiation therapy 09/16/13-09/30/13   prevascular lymph node  . Hyperlipidemia   . Hypertension   . Hyperthyroidism    dr Loanne Drilling- radioactive iodine treatment 2011- resolved  . Hypothyroidism   . LBBB (left bundle branch block)   . Lung cancer ([place])    Bilaterally, RUL lobectomy and spot involving both lungs- "cancer"  . Macular degeneration    bilateral  . Melanoma ([place])    left leg  . Radiation 10/11/11-10/21/11   Left upper lobe adenocarcinoma  . Tremor    hand- intention tremor    ALLERGIES:  is allergic to meperidine hcl; penicillins; morphine; oxycodone-acetaminophen; propoxyphene n-acetaminophen; and tramadol hcl.  MEDICATIONS:  Current Outpatient Prescriptions  Medication Sig Dispense Refill  . acetaminophen (TYLENOL) 650 MG CR tablet Take 1,300 mg by mouth 2 (two) times daily.     Marland Kitchen  aspirin EC 81 MG tablet Take 1 tablet (81 mg total) by mouth daily. 90 tablet 3  . atenolol (TENORMIN) 25 MG tablet TAKE 1 TABLET(25 MG) BY MOUTH TWICE DAILY 180 tablet 3  . Calcium Carbonate-Vitamin D3 (CALCIUM 600-D) 600-400 MG-UNIT TABS Take 2 tablets by mouth daily.     Marland Kitchen DILT-XR 240 MG 24 hr capsule TAKE 1  CAPSULE(240 MG) BY MOUTH DAILY 90 capsule 2  . etodolac (LODINE) 400 MG tablet Take 400 mg by mouth 2 (two) times daily.    . Ferrous Sulfate (IRON) 325 (65 Fe) MG TABS Take 325 mg by mouth 2 (two) times daily.    Marland Kitchen gabapentin (NEURONTIN) 300 MG capsule Take 300 mg by mouth 2 (two) times daily.   2  . hydrochlorothiazide (HYDRODIURIL) 25 MG tablet Take 25 mg by mouth daily.    Marland Kitchen levothyroxine (SYNTHROID, LEVOTHROID) 100 MCG tablet TAKE 1 TABLET(100 MCG) BY MOUTH DAILY BEFORE BREAKFAST 30 tablet 9  . Multiple Vitamin (MULTIVITAMIN WITH MINERALS) TABS tablet Take 1 tablet by mouth daily.    . Multiple Vitamins-Minerals (PRESERVISION AREDS 2 PO) Take 1 capsule by mouth 2 (two) times daily.    . pantoprazole (PROTONIX) 40 MG tablet Take 40 mg by mouth daily.     . pravastatin (PRAVACHOL) 10 MG tablet Take 1 tablet (10 mg total) by mouth daily. 90 tablet 2  . fluticasone (FLONASE) 50 MCG/ACT nasal spray Place 1 spray into both nostrils daily as needed for allergies.      No current facility-administered medications for this visit.     SURGICAL HISTORY:  Past Surgical History:  Procedure Laterality Date  . basal cell and squamous cell skin cell area removed     new left lower eye lid done with skin graft   . CATARACT EXTRACTION, BILATERAL Bilateral   . COLONOSCOPY WITH PROPOFOL N/A 10/27/2016   Procedure: COLONOSCOPY WITH PROPOFOL;  Surgeon: Laurence Spates, MD;  Location: [facility] ENDOSCOPY;  Service: Endoscopy;  Laterality: N/A;  . conization for cervical dysplasia    . esophogus stretching    . IR GENERIC HISTORICAL  09/11/2014   IR RADIOLOGIST EVAL & MGMT 09/11/2014 Jacqulynn Cadet, MD [facility] INTERV RAD  . radio frequency ablation on lung spot    . rul lobectomy  2010  . TONSILLECTOMY      REVIEW OF SYSTEMS:  Constitutional: positive for fatigue Eyes: negative Ears, nose, mouth, throat, and face: negative Respiratory: negative Cardiovascular: negative Gastrointestinal:  negative Genitourinary:negative Integument/breast: negative Hematologic/lymphatic: negative Musculoskeletal:negative Neurological: negative Behavioral/Psych: negative Endocrine: negative Allergic/Immunologic: negative   PHYSICAL EXAMINATION: General appearance: alert, cooperative, fatigued and no distress Head: Normocephalic, without obvious abnormality, atraumatic Neck: no adenopathy Lymph nodes: Cervical, supraclavicular, and axillary nodes normal. Resp: clear to auscultation bilaterally Back: symmetric, no curvature. ROM normal. No CVA tenderness. Cardio: regular rate and rhythm, S1, S2 normal, no murmur, click, rub or gallop GI: soft, non-tender; bowel sounds normal; no masses,  no organomegaly Extremities: extremities normal, atraumatic, no cyanosis or edema Neurologic: Alert and oriented X 3, normal strength and tone. Normal symmetric reflexes. Normal coordination and gait  ECOG PERFORMANCE STATUS: 1 - Symptomatic but completely ambulatory  Blood pressure (!) 155/72, pulse 71, temperature 97.9 F (36.6 C), temperature source Oral, resp. rate 18, height '5\' 4"'  (1.626 m), weight 144 lb 6.4 oz (65.5 kg), SpO2 99 %.  LABORATORY DATA: Lab Results  Component Value Date   WBC 7.7 11/30/2016   HGB 13.7 11/30/2016   HCT 43.0 11/30/2016  MCV 94.7 11/30/2016   PLT 242 11/30/2016      Chemistry      Component Value Date/Time   NA 144 11/30/2016 1132   K 4.0 11/30/2016 1132   CL 104 04/19/2016 1103   CL 105 07/17/2012 1434   CO2 32 (H) 11/30/2016 1132   BUN 32.1 (H) 11/30/2016 1132   CREATININE 1.5 (H) 11/30/2016 1132      Component Value Date/Time   CALCIUM 10.3 11/30/2016 1132   ALKPHOS 75 11/30/2016 1132   AST 18 11/30/2016 1132   ALT 21 11/30/2016 1132   BILITOT 0.54 11/30/2016 1132       RADIOGRAPHIC STUDIES: Ct Chest W Contrast  Result Date: 11/30/2016 CLINICAL DATA:  Right upper lobe primary bronchogenic adenocarcinoma diagnosed in 2010 status post right  upper lobectomy. Status post stereotactic radiotherapy to recurrent left upper lobe pulmonary nodule in 2013, stereotactic radiotherapy to left prevascular mediastinal lymphadenopathy in 2015 and percutaneous thermal ablation of recurrent left upper lobe neoplasm 10/01/2014. Patient presents for restaging on interval observation. EXAM: CT CHEST WITH CONTRAST TECHNIQUE: Multidetector CT imaging of the chest was performed during intravenous contrast administration. CONTRAST:  29m ISOVUE-300 IOPAMIDOL (ISOVUE-300) INJECTION 61% COMPARISON:  08/24/2016 chest CT. FINDINGS: Cardiovascular: Normal heart size. No significant pericardial fluid/thickening. Left anterior descending and right coronary atherosclerosis. Atherosclerotic nonaneurysmal thoracic aorta. Normal caliber pulmonary arteries. No central pulmonary emboli. Mediastinum/Nodes: Heterogeneous atrophic thyroid gland without discrete thyroid nodules. Unremarkable esophagus. No pathologically enlarged axillary, mediastinal or hilar lymph nodes. Lungs/Pleura: No pneumothorax. No pleural effusion. Status post right upper lobectomy. Mild centrilobular emphysema. No acute consolidative airspace disease. Ground-glass pulmonary nodules scattered throughout both lungs, largest 2.8 x 2.0 cm in the anterior left lower lobe (series 5/ image 54), all stable using similar measurement technique. Two left lower lobe solid pulmonary nodules, largest 1.4 x 1.1 cm (series 5/ image 45), both stable using similar measurement technique. Predominantly solid 1.1 x 0.9 cm left upper lobe pulmonary nodule (series 5/ image 53), previously 1.0 x 0.8 cm using similar measurement technique, stable to minimally increased. Posterior left upper lobe 3.7 x 2.1 cm lung mass (series 5/ image 38) with associated volume loss and distortion, previously 3.8 x 2.0 cm using similar measurement technique, not appreciably changed. No new significant pulmonary nodules. Upper abdomen: Small to moderate  hiatal hernia. No discrete adrenal nodules. Musculoskeletal: No aggressive appearing focal osseous lesions. Moderate thoracic spondylosis. IMPRESSION: 1. Interval stability of posterior left upper lobe lung mass, compatible with treated tumor. 2. Predominantly solid 1.1 cm left upper lobe pulmonary nodule is stable to minimally increased in size. Otherwise no new or progressive metastatic disease in the chest. Solid left lower lobe pulmonary nodules and bilateral ground-glass pulmonary nodules are stable. 3. Aortic atherosclerosis.  Two-vessel coronary atherosclerosis. 4. Mild emphysema. 5. Small to moderate hiatal hernia. Electronically Signed   By: JIlona SorrelM.D.   On: 11/30/2016 16:00   ASSESSMENT AND PLAN:  This is a very pleasant 76years old white female with recurrent non-small cell lung cancer, adenocarcinoma with questionable EGFR mutation. The patient was treated in the past with Tarceva for a few weeks but this was discontinued secondary to internal returns and significant fatigue and the patient refused to resume any further treatment at that time. She underwent several local treatment including stereotactic radiotherapy as well as radiofrequency ablation to the current tumor in the left upper lobe. She is currently on observation. She had repeat CT scan of the chest performed recently. I  personally and independently reviewed the scan images and discuss the results and showed the images to the patient today. Unfortunately his scan showed some evidence for mild disease progression in multiple pulmonary nodules. I discussed with the patient several options for management of her condition including treatment with Tagrisso versus continuous observation and close monitoring. The patient indicated that she does not take any treatment at this point because she is busy taking care of her ill husband. She would like to continue on observation. I will see her back for follow-up visit in 3 months for  reevaluation with repeat CT scan of the chest for restaging of her disease. She was advised to call immediately if she has any concerning symptoms in the interval. All questions were answered. The patient knows to call the clinic with any problems, questions or concerns. We can certainly see the patient much sooner if necessary. I spent 15 minutes counseling the patient face to face. The total time spent in the appointment was 25 minutes.  Disclaimer: This note was dictated with voice recognition software. Similar sounding words can inadvertently be transcribed and may not be corrected upon review.

## 2016-12-10 NOTE — Addendum Note (Signed)
Addendum  created 12/10/16 0829 by Duane Boston, MD   Sign clinical note

## 2017-01-24 DIAGNOSIS — M5136 Other intervertebral disc degeneration, lumbar region: Secondary | ICD-10-CM | POA: Diagnosis not present

## 2017-01-24 DIAGNOSIS — M4726 Other spondylosis with radiculopathy, lumbar region: Secondary | ICD-10-CM | POA: Diagnosis not present

## 2017-01-24 DIAGNOSIS — M7062 Trochanteric bursitis, left hip: Secondary | ICD-10-CM | POA: Diagnosis not present

## 2017-01-24 DIAGNOSIS — M47897 Other spondylosis, lumbosacral region: Secondary | ICD-10-CM | POA: Diagnosis not present

## 2017-01-24 DIAGNOSIS — M545 Low back pain: Secondary | ICD-10-CM | POA: Diagnosis not present

## 2017-03-01 ENCOUNTER — Encounter (HOSPITAL_COMMUNITY): Payer: Self-pay

## 2017-03-01 ENCOUNTER — Ambulatory Visit (HOSPITAL_COMMUNITY)
Admission: RE | Admit: 2017-03-01 | Discharge: 2017-03-01 | Disposition: A | Discharge status: Home / Self Care | Payer: Medicare Other | Source: Ambulatory Visit | Attending: Internal Medicine | Admitting: Internal Medicine

## 2017-03-01 ENCOUNTER — Other Ambulatory Visit (HOSPITAL_BASED_OUTPATIENT_CLINIC_OR_DEPARTMENT_OTHER): Payer: Medicare Other

## 2017-03-01 DIAGNOSIS — C3412 Malignant neoplasm of upper lobe, left bronchus or lung: Secondary | ICD-10-CM

## 2017-03-01 DIAGNOSIS — J439 Emphysema, unspecified: Secondary | ICD-10-CM | POA: Insufficient documentation

## 2017-03-01 DIAGNOSIS — C3492 Malignant neoplasm of unspecified part of left bronchus or lung: Secondary | ICD-10-CM

## 2017-03-01 DIAGNOSIS — I700 Atherosclerosis of aorta: Secondary | ICD-10-CM | POA: Diagnosis not present

## 2017-03-01 DIAGNOSIS — C3490 Malignant neoplasm of unspecified part of unspecified bronchus or lung: Secondary | ICD-10-CM | POA: Diagnosis not present

## 2017-03-01 DIAGNOSIS — I2510 Atherosclerotic heart disease of native coronary artery without angina pectoris: Secondary | ICD-10-CM | POA: Insufficient documentation

## 2017-03-01 LAB — CBC WITH DIFFERENTIAL/PLATELET
BASO%: 0.5 % (ref 0.0–2.0)
Basophils Absolute: 0 10*3/uL (ref 0.0–0.1)
EOS ABS: 0.6 10*3/uL — AB (ref 0.0–0.5)
EOS%: 7.2 % — ABNORMAL HIGH (ref 0.0–7.0)
HCT: 40.7 % (ref 34.8–46.6)
HGB: 12.8 g/dL (ref 11.6–15.9)
LYMPH%: 16 % (ref 14.0–49.7)
MCH: 30.5 pg (ref 25.1–34.0)
MCHC: 31.4 g/dL — AB (ref 31.5–36.0)
MCV: 97.1 fL (ref 79.5–101.0)
MONO#: 1 10*3/uL — AB (ref 0.1–0.9)
MONO%: 12.3 % (ref 0.0–14.0)
NEUT%: 64 % (ref 38.4–76.8)
NEUTROS ABS: 5.2 10*3/uL (ref 1.5–6.5)
PLATELETS: 231 10*3/uL (ref 145–400)
RBC: 4.19 10*6/uL (ref 3.70–5.45)
RDW: 13.9 % (ref 11.2–14.5)
WBC: 8.2 10*3/uL (ref 3.9–10.3)
lymph#: 1.3 10*3/uL (ref 0.9–3.3)

## 2017-03-01 LAB — COMPREHENSIVE METABOLIC PANEL
ALK PHOS: 71 U/L (ref 40–150)
ALT: 24 U/L (ref 0–55)
ANION GAP: 6 meq/L (ref 3–11)
AST: 19 U/L (ref 5–34)
Albumin: 3.2 g/dL — ABNORMAL LOW (ref 3.5–5.0)
BUN: 18.2 mg/dL (ref 7.0–26.0)
CALCIUM: 10.1 mg/dL (ref 8.4–10.4)
CO2: 30 mEq/L — ABNORMAL HIGH (ref 22–29)
CREATININE: 1.2 mg/dL — AB (ref 0.6–1.1)
Chloride: 105 mEq/L (ref 98–109)
EGFR: 43 mL/min/{1.73_m2} — ABNORMAL LOW (ref >90)
GLUCOSE: 98 mg/dL (ref 70–140)
Potassium: 4.3 mEq/L (ref 3.5–5.1)
Sodium: 141 mEq/L (ref 136–145)
Total Bilirubin: 0.45 mg/dL (ref 0.20–1.20)
Total Protein: 6 g/dL — ABNORMAL LOW (ref 6.4–8.3)

## 2017-03-01 MED ORDER — IOPAMIDOL (ISOVUE-300) INJECTION 61%
INTRAVENOUS | Status: AC
  Administered 2017-03-01: 60 mL via INTRAVENOUS
  Filled 2017-03-01: qty 75

## 2017-03-01 MED ORDER — IOPAMIDOL (ISOVUE-300) INJECTION 61%
75.0000 mL | Freq: Once | INTRAVENOUS | Status: AC | PRN
  Administered 2017-03-01: 60 mL via INTRAVENOUS

## 2017-03-02 ENCOUNTER — Telehealth: Payer: Self-pay | Admitting: Medical Oncology

## 2017-03-02 ENCOUNTER — Telehealth: Payer: Self-pay | Admitting: Internal Medicine

## 2017-03-02 ENCOUNTER — Encounter: Payer: Self-pay | Admitting: Internal Medicine

## 2017-03-02 ENCOUNTER — Telehealth: Payer: Self-pay | Admitting: Cardiology

## 2017-03-02 ENCOUNTER — Other Ambulatory Visit: Payer: Self-pay | Admitting: Medical Oncology

## 2017-03-02 ENCOUNTER — Ambulatory Visit (HOSPITAL_BASED_OUTPATIENT_CLINIC_OR_DEPARTMENT_OTHER): Payer: Medicare Other | Admitting: Internal Medicine

## 2017-03-02 VITALS — BP 187/67 | HR 74 | Temp 97.9°F | Resp 17 | Ht 64.0 in | Wt 140.9 lb

## 2017-03-02 DIAGNOSIS — I10 Essential (primary) hypertension: Secondary | ICD-10-CM

## 2017-03-02 DIAGNOSIS — C3412 Malignant neoplasm of upper lobe, left bronchus or lung: Secondary | ICD-10-CM | POA: Diagnosis not present

## 2017-03-02 DIAGNOSIS — C3492 Malignant neoplasm of unspecified part of left bronchus or lung: Secondary | ICD-10-CM

## 2017-03-02 MED ORDER — AMLODIPINE BESYLATE 2.5 MG PO TABS: 2.5000 mg | ORAL_TABLET | Freq: Every day | ORAL | 2 refills | Status: DC

## 2017-03-02 MED ORDER — CLONIDINE HCL 0.1 MG PO TABS
0.1000 mg | ORAL_TABLET | Freq: Once | ORAL | Status: AC
  Administered 2017-03-02: 0.1 mg via ORAL

## 2017-03-02 MED ORDER — CLONIDINE HCL 0.1 MG PO TABS
ORAL_TABLET | ORAL | Status: AC
  Filled 2017-03-02: qty 1

## 2017-03-02 NOTE — Telephone Encounter (Signed)
Message left for dr Meda Coffee to call back regarding hypertension.

## 2017-03-02 NOTE — Telephone Encounter (Signed)
Spoke with Diane RN at the [facility], as well as with the pt, and informed them both that per Dr Meda Coffee, she recommends that we add Amlodipine 2.5 mg po daily to the pts regimen.  Advised the pt to continue monitoring her BP at home and report any critical readings.  Confirmed the pharmacy of choice with the pt.   Pt verbalized understanding and agrees with this plan.

## 2017-03-02 NOTE — Telephone Encounter (Signed)
Gave patient avs and calendar with upcoming appts.  °

## 2017-03-02 NOTE — Progress Notes (Signed)
PT agreed to take 0.1 mg clonidine

## 2017-03-02 NOTE — Progress Notes (Signed)
[place] [phone]   Fax:(336) [phone]  OFFICE PROGRESS NOTE  Wenda Low, MD [address] [place] [address]  PRINCIPAL DIAGNOSES:  1. 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 The patient presented at that time with a right upper lobe lesion, as well as suspicious ground-glass opacities in the left lung. The tumor was negative for EGFR mutation and negative for ALK gene translocation. Veristrat test Good. EGFR mutation studies were conflicting, initial test was negative, second test was positive for EGFR mutation in exon 18 and the the test from [facility] one was negative with positive KRAS mutation in Exon 12. 2. Stage IA malignant melanoma, status post wide excision by Dr. Allyson Sabal on Nov 12, 2008.  PRIOR THERAPY:  1) Status post right upper lobectomy with lymph node dissection under the care of Dr. Arlyce Dice on May 21, 2008.  2) Stereotactic radiotherapy to the left upper lobe lung nodule under the care of Dr. Pablo Ledger expected to be completed on 10/21/2011.  3) stereotactic radiotherapy to the left prevascular lymphadenopathy under the care of Dr. Pablo Ledger completed on 09/26/2013. 4) Tarceva 150 mg by mouth daily started 11/29/2013 discontinued today secondary to intolerance with persistent diarrhea and fatigue.  5) Tarceva 100 mg by mouth daily started on 12/27/2013. Discontinued one week after treatment because of intolerance. 6) status post thermal ablation of the left upper lobe recurrent adenocarcinoma under the care of Dr. Laurence Ferrari on 10/01/2014  CURRENT THERAPY: Observation.  Advanced directives: The patient has advanced directive and no change is requested.  INTERVAL HISTORY: [name] 76 y.o. female returns to the clinic today for follow-up visit accompanied by her son. Unfortunately the patient  lost her husband last month after suffering from metastatic renal cell carcinoma. She is a little bit upset and tearful today. Her blood pressure is very high but usually much better at home. She denied having any chest pain, shortness of breath, cough or hemoptysis. She denied having any fever or chills. She has no nausea, vomiting, diarrhea or constipation. She had repeat CT scan of the chest performed recently and she is here for evaluation and discussion of her scan results.   MEDICAL HISTORY: Past Medical History:  Diagnosis Date  . Arthritis    back.and feet  . Cervical ca ([place]) dx'd 1988   surg only  . CKD (chronic kidney disease), stage III   . Complication of anesthesia    difficulty awakening, dysrhythmia post surgery, (prolonged sedation level)  . Coronary artery calcification seen on CT scan   . Headache    occ. sinus type headaches  . Hx of radiation therapy 09/16/13-09/30/13   prevascular lymph node  . Hyperlipidemia   . Hypertension   . Hyperthyroidism    dr Loanne Drilling- radioactive iodine treatment 2011- resolved  . Hypothyroidism   . LBBB (left bundle branch block)   . Lung cancer ([place])    Bilaterally, RUL lobectomy and spot involving both lungs- "cancer"  . Macular degeneration    bilateral  . Melanoma ([place])    left leg  . Radiation 10/11/11-10/21/11   Left upper lobe adenocarcinoma  . Tremor    hand- intention tremor    ALLERGIES:  is allergic to meperidine hcl; penicillins; morphine; oxycodone-acetaminophen; propoxyphene n-acetaminophen; and tramadol hcl.  MEDICATIONS:  Current Outpatient Prescriptions  Medication Sig Dispense Refill  . acetaminophen (TYLENOL) 650 MG  CR tablet Take 1,300 mg by mouth 2 (two) times daily.     Marland Kitchen aspirin EC 81 MG tablet Take 1 tablet (81 mg total) by mouth daily. 90 tablet 3  . atenolol (TENORMIN) 25 MG tablet TAKE 1 TABLET(25 MG) BY MOUTH TWICE DAILY 180 tablet 3  . Calcium Carbonate-Vitamin D3 (CALCIUM 600-D) 600-400 MG-UNIT TABS  Take 2 tablets by mouth daily.     Marland Kitchen DILT-XR 240 MG 24 hr capsule TAKE 1 CAPSULE(240 MG) BY MOUTH DAILY 90 capsule 2  . etodolac (LODINE) 400 MG tablet Take 400 mg by mouth 2 (two) times daily.    . Ferrous Sulfate (IRON) 325 (65 Fe) MG TABS Take 325 mg by mouth 2 (two) times daily.    . fluticasone (FLONASE) 50 MCG/ACT nasal spray Place 1 spray into both nostrils daily as needed for allergies.     Marland Kitchen gabapentin (NEURONTIN) 300 MG capsule Take 300 mg by mouth 2 (two) times daily.   2  . hydrochlorothiazide (HYDRODIURIL) 25 MG tablet Take 25 mg by mouth daily.    Marland Kitchen levothyroxine (SYNTHROID, LEVOTHROID) 100 MCG tablet TAKE 1 TABLET(100 MCG) BY MOUTH DAILY BEFORE BREAKFAST 30 tablet 9  . Multiple Vitamin (MULTIVITAMIN WITH MINERALS) TABS tablet Take 1 tablet by mouth daily.    . Multiple Vitamins-Minerals (PRESERVISION AREDS 2 PO) Take 1 capsule by mouth 2 (two) times daily.    . pantoprazole (PROTONIX) 40 MG tablet Take 40 mg by mouth daily.     . pravastatin (PRAVACHOL) 10 MG tablet Take 1 tablet (10 mg total) by mouth daily. 90 tablet 2   No current facility-administered medications for this visit.     SURGICAL HISTORY:  Past Surgical History:  Procedure Laterality Date  . basal cell and squamous cell skin cell area removed     new left lower eye lid done with skin graft   . CATARACT EXTRACTION, BILATERAL Bilateral   . COLONOSCOPY WITH PROPOFOL N/A 10/27/2016   Procedure: COLONOSCOPY WITH PROPOFOL;  Surgeon: Laurence Spates, MD;  Location: [facility];  Service: Endoscopy;  Laterality: N/A;  . conization for cervical dysplasia    . esophogus stretching    . IR GENERIC HISTORICAL  09/11/2014   IR RADIOLOGIST EVAL & MGMT 09/11/2014 Jacqulynn Cadet, MD [facility] INTERV RAD  . radio frequency ablation on lung spot    . rul lobectomy  2010  . TONSILLECTOMY      REVIEW OF SYSTEMS:  A comprehensive review of systems was negative.   PHYSICAL EXAMINATION: General appearance: alert, cooperative,  fatigued and no distress Head: Normocephalic, without obvious abnormality, atraumatic Neck: no adenopathy Lymph nodes: Cervical, supraclavicular, and axillary nodes normal. Resp: clear to auscultation bilaterally Back: symmetric, no curvature. ROM normal. No CVA tenderness. Cardio: regular rate and rhythm, S1, S2 normal, no murmur, click, rub or gallop GI: soft, non-tender; bowel sounds normal; no masses,  no organomegaly Extremities: extremities normal, atraumatic, no cyanosis or edema  ECOG PERFORMANCE STATUS: 1 - Symptomatic but completely ambulatory  Blood pressure (!) 187/67, pulse 74, temperature 97.9 F (36.6 C), temperature source Oral, resp. rate 17, height '5\' 4"'  (1.626 m), weight 140 lb 14.4 oz (63.9 kg), SpO2 98 %.  LABORATORY DATA: Lab Results  Component Value Date   WBC 8.2 03/01/2017   HGB 12.8 03/01/2017   HCT 40.7 03/01/2017   MCV 97.1 03/01/2017   PLT 231 03/01/2017      Chemistry      Component Value Date/Time   NA 141  03/01/2017 0947   K 4.3 03/01/2017 0947   CL 104 04/19/2016 1103   CL 105 07/17/2012 1434   CO2 30 (H) 03/01/2017 0947   BUN 18.2 03/01/2017 0947   CREATININE 1.2 (H) 03/01/2017 0947      Component Value Date/Time   CALCIUM 10.1 03/01/2017 0947   ALKPHOS 71 03/01/2017 0947   AST 19 03/01/2017 0947   ALT 24 03/01/2017 0947   BILITOT 0.45 03/01/2017 0947       RADIOGRAPHIC STUDIES: Ct Chest W Contrast  Result Date: 03/01/2017 CLINICAL DATA:  Lung cancer. EXAM: CT CHEST WITH CONTRAST TECHNIQUE: Multidetector CT imaging of the chest was performed during intravenous contrast administration. CONTRAST:  60 cc Isovue 300 COMPARISON:  11/30/2016 FINDINGS: Cardiovascular: Heart size upper normal. Coronary artery calcification is noted. Atherosclerotic calcification is noted in the wall of the thoracic aorta. Mediastinum/Nodes: No mediastinal lymphadenopathy. No right hilar lymphadenopathy. Abnormal soft tissue in the upper left hilum is similar  to prior. Lungs/Pleura: Left upper lobe mass measured previously at 3.7 x 2.1 cm is stable, measuring 3.7 x 2.0 cm today. More peripheral left upper lobe nodule measured previously at 9 x 11 mm now measures 9 x 11 mm. Anterior ground-glass opacity in the left lower lobe measured at 2.8 x 2.0 cm on the prior study now measures 2.3 x 1.7 cm. Left lower lobe nodule measured on the prior study at 1.4 x 1.1 cm now measures 1.7 x 1.0 cm.Small nodule in the medial left lung base (image 102) is 8 mm today compared to 6 mm previously. Small ground-glass nodules are again noted in the left upper lung. Upper Abdomen: Exophytic lesion upper pole right kidney again identified but incompletely visualized. This measures at least 1.9 cm today and again demonstrates attenuation too high to be a simple cyst. This lesion was present on PET-CT of 07/14/2015 and showed no hypermetabolism at that time. Musculoskeletal: Bone windows reveal no worrisome lytic or sclerotic osseous lesions. IMPRESSION: 1. No change left upper lobe lung mass. 2. Predominantly solid left upper lobe 11 mm nodule described as stable to slightly progressive on the prior study is stable at 11 mm today. 3. Superior segment left lower lobe nodule is stable to minimally progressed in the interval, measuring up to 1.7 cm maximum diameter today compared at 1.5 cm when I remeasure it on the prior study. 4. Other scattered solid and sub solid pulmonary nodules bilaterally are similar in the interval. 5.  Emphysema. (ICD10-J43.9) 6.  Aortic Atherosclerois (ICD10-170.0) Electronically Signed   By: Misty Stanley M.D.   On: 03/01/2017 14:10   ASSESSMENT AND PLAN:  This is a very pleasant 76 years old white female with recurrent non-small cell lung cancer, adenocarcinoma with questionable EGFR mutation. The patient was treated in the past with Tarceva for a few weeks but this was discontinued secondary to internal returns and significant fatigue and the patient refused to  resume any further treatment at that time. She underwent several local treatment including stereotactic radiotherapy as well as radiofrequency ablation to the current tumor in the left upper lobe. She is currently on observation and has been doing very well. Her recent CT scan of the chest showed no clear evidence for disease progression. I personally reviewed the scan images and discuss the results with the patient and her son. I recommended for her to continue on observation with repeat CT scan of the chest in 3 months. For hypertension, we will recheck her blood pressure again and  if still elevated we'll consider the patient for treatment with clonidine or sending her to the emergency department for evaluation which she declined. She was advised to call immediately if she has any concerning symptoms in the interval. All questions were answered. The patient knows to call the clinic with any problems, questions or concerns. We can certainly see the patient much sooner if necessary. I spent 10 minutes counseling the patient face to face. The total time spent in the appointment was 15 minutes.  Disclaimer: This note was dictated with voice recognition software. Similar sounding words can inadvertently be transcribed and may not be corrected upon review.

## 2017-03-02 NOTE — Telephone Encounter (Signed)
New Message     Dr Earlie Server would like to know what you want to give patient for hypertension    Pager [phone]

## 2017-03-02 NOTE — Telephone Encounter (Signed)
I would add amlodipine 2.5 mg po daily.

## 2017-03-03 ENCOUNTER — Telehealth: Payer: Self-pay

## 2017-03-03 NOTE — Telephone Encounter (Signed)
Called patient with upcoming appointment. Per los

## 2017-03-14 ENCOUNTER — Telehealth: Payer: Self-pay | Admitting: Cardiology

## 2017-03-14 NOTE — Telephone Encounter (Signed)
Pt calling to report to Dr Meda Coffee that since she started her on Amlodipine 2.5 mg po daily, in addition to her other cardiac meds, her BPs are still running high in the morning and late evening.  Pt reports that in the morning, her BPs run: 147-092 systolic and 95-74 diastolic. Pt reports that in the late evening time, her BPs runs around the same ranges that it does in the morning.  Pt states that her BPs in the middle of the day improve at:  136/77 and highest 143/72.  Pt states that Dr Meda Coffee may need to make further adjustments to her BP regimen.  Pt is asymptomatic with numbers mentioned.  Pt states that she takes the bulk of her meds, in the morning time, after breakfast, unless its a BID med. Informed the pt that Dr Meda Coffee is out of the office today, but I will route this concern to her for further review and recommendation, and follow-up with the pt shortly thereafter. Pt verbalized understanding and agrees with this plan.

## 2017-03-14 NOTE — Telephone Encounter (Signed)
Follow Up:   Please call,concerning her Amlodipine.She thinks her dose might need to be increased.

## 2017-03-15 MED ORDER — AMLODIPINE BESYLATE 5 MG PO TABS: 5.0000 mg | ORAL_TABLET | Freq: Every day | ORAL | 3 refills | Status: DC

## 2017-03-15 NOTE — Telephone Encounter (Signed)
Notified the pt that per Dr Meda Coffee, she recommends that we increase her amlodipine to 5 mg po daily.  Confirmed the pharmacy of choice with the pt.  Pt verbalized understanding and agrees with this plan.

## 2017-03-15 NOTE — Telephone Encounter (Signed)
Please increase to 5 mg po daily

## 2017-03-21 DIAGNOSIS — I10 Essential (primary) hypertension: Secondary | ICD-10-CM | POA: Diagnosis not present

## 2017-03-21 DIAGNOSIS — C3491 Malignant neoplasm of unspecified part of right bronchus or lung: Secondary | ICD-10-CM | POA: Diagnosis not present

## 2017-03-21 DIAGNOSIS — E039 Hypothyroidism, unspecified: Secondary | ICD-10-CM | POA: Diagnosis not present

## 2017-03-21 DIAGNOSIS — N183 Chronic kidney disease, stage 3 (moderate): Secondary | ICD-10-CM | POA: Diagnosis not present

## 2017-03-21 DIAGNOSIS — M810 Age-related osteoporosis without current pathological fracture: Secondary | ICD-10-CM | POA: Diagnosis not present

## 2017-04-14 DIAGNOSIS — R110 Nausea: Secondary | ICD-10-CM | POA: Diagnosis not present

## 2017-04-14 DIAGNOSIS — I10 Essential (primary) hypertension: Secondary | ICD-10-CM | POA: Diagnosis not present

## 2017-04-14 DIAGNOSIS — R109 Unspecified abdominal pain: Secondary | ICD-10-CM | POA: Diagnosis not present

## 2017-05-24 DIAGNOSIS — Z23 Encounter for immunization: Secondary | ICD-10-CM | POA: Diagnosis not present

## 2017-05-24 DIAGNOSIS — M5136 Other intervertebral disc degeneration, lumbar region: Secondary | ICD-10-CM | POA: Diagnosis not present

## 2017-05-24 DIAGNOSIS — N183 Chronic kidney disease, stage 3 (moderate): Secondary | ICD-10-CM | POA: Diagnosis not present

## 2017-05-24 DIAGNOSIS — C3491 Malignant neoplasm of unspecified part of right bronchus or lung: Secondary | ICD-10-CM | POA: Diagnosis not present

## 2017-05-24 DIAGNOSIS — E7800 Pure hypercholesterolemia, unspecified: Secondary | ICD-10-CM | POA: Diagnosis not present

## 2017-05-24 DIAGNOSIS — I10 Essential (primary) hypertension: Secondary | ICD-10-CM | POA: Diagnosis not present

## 2017-05-24 DIAGNOSIS — K219 Gastro-esophageal reflux disease without esophagitis: Secondary | ICD-10-CM | POA: Diagnosis not present

## 2017-05-24 DIAGNOSIS — E039 Hypothyroidism, unspecified: Secondary | ICD-10-CM | POA: Diagnosis not present

## 2017-05-29 ENCOUNTER — Encounter (HOSPITAL_COMMUNITY): Payer: Self-pay

## 2017-05-29 ENCOUNTER — Ambulatory Visit (HOSPITAL_COMMUNITY)
Admission: RE | Admit: 2017-05-29 | Discharge: 2017-05-29 | Disposition: A | Discharge status: Home / Self Care | Payer: Medicare Other | Source: Ambulatory Visit | Attending: Internal Medicine | Admitting: Internal Medicine

## 2017-05-29 ENCOUNTER — Other Ambulatory Visit (HOSPITAL_BASED_OUTPATIENT_CLINIC_OR_DEPARTMENT_OTHER): Payer: Medicare Other

## 2017-05-29 DIAGNOSIS — C3492 Malignant neoplasm of unspecified part of left bronchus or lung: Secondary | ICD-10-CM

## 2017-05-29 DIAGNOSIS — C3412 Malignant neoplasm of upper lobe, left bronchus or lung: Secondary | ICD-10-CM | POA: Insufficient documentation

## 2017-05-29 DIAGNOSIS — I10 Essential (primary) hypertension: Secondary | ICD-10-CM

## 2017-05-29 DIAGNOSIS — R918 Other nonspecific abnormal finding of lung field: Secondary | ICD-10-CM | POA: Insufficient documentation

## 2017-05-29 LAB — CBC WITH DIFFERENTIAL/PLATELET
BASO%: 0.7 % (ref 0.0–2.0)
BASOS ABS: 0.1 10*3/uL (ref 0.0–0.1)
EOS%: 11.2 % — ABNORMAL HIGH (ref 0.0–7.0)
Eosinophils Absolute: 0.9 10*3/uL — ABNORMAL HIGH (ref 0.0–0.5)
HCT: 37.9 % (ref 34.8–46.6)
HGB: 12.3 g/dL (ref 11.6–15.9)
LYMPH%: 20.1 % (ref 14.0–49.7)
MCH: 29.6 pg (ref 25.1–34.0)
MCHC: 32.5 g/dL (ref 31.5–36.0)
MCV: 91 fL (ref 79.5–101.0)
MONO#: 0.8 10*3/uL (ref 0.1–0.9)
MONO%: 10 % (ref 0.0–14.0)
NEUT#: 4.7 10*3/uL (ref 1.5–6.5)
NEUT%: 58 % (ref 38.4–76.8)
Platelets: 238 10*3/uL (ref 145–400)
RBC: 4.16 10*6/uL (ref 3.70–5.45)
RDW: 13.4 % (ref 11.2–14.5)
WBC: 8.1 10*3/uL (ref 3.9–10.3)
lymph#: 1.6 10*3/uL (ref 0.9–3.3)

## 2017-05-29 LAB — COMPREHENSIVE METABOLIC PANEL
ALT: 12 U/L (ref 0–55)
AST: 15 U/L (ref 5–34)
Albumin: 3.5 g/dL (ref 3.5–5.0)
Alkaline Phosphatase: 59 U/L (ref 40–150)
Anion Gap: 9 mEq/L (ref 3–11)
BUN: 26.1 mg/dL — AB (ref 7.0–26.0)
CHLORIDE: 104 meq/L (ref 98–109)
CO2: 28 mEq/L (ref 22–29)
Calcium: 10.4 mg/dL (ref 8.4–10.4)
Creatinine: 1.3 mg/dL — ABNORMAL HIGH (ref 0.6–1.1)
EGFR: 41 mL/min/{1.73_m2} — ABNORMAL LOW (ref >60)
GLUCOSE: 89 mg/dL (ref 70–140)
POTASSIUM: 4.5 meq/L (ref 3.5–5.1)
SODIUM: 141 meq/L (ref 136–145)
Total Bilirubin: 0.51 mg/dL (ref 0.20–1.20)
Total Protein: 6.4 g/dL (ref 6.4–8.3)

## 2017-05-29 MED ORDER — IOPAMIDOL (ISOVUE-300) INJECTION 61%
INTRAVENOUS | Status: AC
  Filled 2017-05-29: qty 75

## 2017-05-29 MED ORDER — IOPAMIDOL (ISOVUE-300) INJECTION 61%
75.0000 mL | Freq: Once | INTRAVENOUS | Status: AC | PRN
  Administered 2017-05-29: 60 mL via INTRAVENOUS

## 2017-06-05 ENCOUNTER — Ambulatory Visit (HOSPITAL_BASED_OUTPATIENT_CLINIC_OR_DEPARTMENT_OTHER): Payer: Medicare Other | Admitting: Internal Medicine

## 2017-06-05 ENCOUNTER — Encounter: Payer: Self-pay | Admitting: Internal Medicine

## 2017-06-05 ENCOUNTER — Telehealth: Payer: Self-pay | Admitting: Internal Medicine

## 2017-06-05 DIAGNOSIS — C7802 Secondary malignant neoplasm of left lung: Secondary | ICD-10-CM | POA: Diagnosis not present

## 2017-06-05 DIAGNOSIS — C3490 Malignant neoplasm of unspecified part of unspecified bronchus or lung: Secondary | ICD-10-CM

## 2017-06-05 DIAGNOSIS — C3411 Malignant neoplasm of upper lobe, right bronchus or lung: Secondary | ICD-10-CM

## 2017-06-05 NOTE — Progress Notes (Signed)
[place] Telephone:([phone]   Fax:(336) [phone]  OFFICE PROGRESS NOTE  Wenda Low, MD [address] [place] [address]  PRINCIPAL DIAGNOSES:  1. 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 The patient presented at that time with a right upper lobe lesion, as well as suspicious ground-glass opacities in the left lung. The tumor was negative for EGFR mutation and negative for ALK gene translocation. Veristrat test Good. EGFR mutation studies were conflicting, initial test was negative, second test was positive for EGFR mutation in exon 18 and the the test from [facility] one was negative with positive KRAS mutation in Exon 12. 2. Stage IA malignant melanoma, status post wide excision by Dr. Allyson Sabal on Nov 12, 2008.  PRIOR THERAPY:  1) Status post right upper lobectomy with lymph node dissection under the care of Dr. Arlyce Dice on May 21, 2008.  2) Stereotactic radiotherapy to the left upper lobe lung nodule under the care of Dr. Pablo Ledger expected to be completed on 10/21/2011.  3) stereotactic radiotherapy to the left prevascular lymphadenopathy under the care of Dr. Pablo Ledger completed on 09/26/2013. 4) Tarceva 150 mg by mouth daily started 11/29/2013 discontinued today secondary to intolerance with persistent diarrhea and fatigue.  5) Tarceva 100 mg by mouth daily started on 12/27/2013. Discontinued one week after treatment because of intolerance. 6) status post thermal ablation of the left upper lobe recurrent adenocarcinoma under the care of Dr. Laurence Ferrari on 10/01/2014  CURRENT THERAPY: Observation.  Advanced directives: The patient has advanced directive and no change is requested.  INTERVAL HISTORY: [name] 76 y.o. female returns to the clinic today for follow-up visit.  The patient is feeling fine today with no  specific complaints.  She denied having any fatigue or weakness.  She denied having any chest pain, shortness of breath, cough or hemoptysis.  She has no nausea, vomiting, diarrhea or constipation.  Has no significant weight loss or night sweats.  She had a repeat CT scan of the chest performed recently and she is here for evaluation and discussion of her scan results.  MEDICAL HISTORY: Past Medical History:  Diagnosis Date  . Arthritis    back.and feet  . Cervical ca ([place]) dx'd 1988   surg only  . CKD (chronic kidney disease), stage III ([place])   . Complication of anesthesia    difficulty awakening, dysrhythmia post surgery, (prolonged sedation level)  . Coronary artery calcification seen on CT scan   . Headache    occ. sinus type headaches  . Hx of radiation therapy 09/16/13-09/30/13   prevascular lymph node  . Hyperlipidemia   . Hypertension   . Hyperthyroidism    dr Loanne Drilling- radioactive iodine treatment 2011- resolved  . Hypothyroidism   . LBBB (left bundle branch block)   . Lung cancer ([place])    Bilaterally, RUL lobectomy and spot involving both lungs- "cancer"  . Macular degeneration    bilateral  . Melanoma ([place])    left leg  . Radiation 10/11/11-10/21/11   Left upper lobe adenocarcinoma  . Tremor    hand- intention tremor    ALLERGIES:  is allergic to meperidine hcl; penicillins; morphine; oxycodone-acetaminophen; propoxyphene n-acetaminophen; and tramadol hcl.  MEDICATIONS:  Current Outpatient Medications  Medication Sig Dispense Refill  . acetaminophen (TYLENOL) 650 MG CR tablet Take 1,300 mg by mouth 2 (two) times daily.     Marland Kitchen  amLODipine (NORVASC) 5 MG tablet Take 1 tablet (5 mg total) by mouth daily. 90 tablet 3  . aspirin EC 81 MG tablet Take 1 tablet (81 mg total) by mouth daily. 90 tablet 3  . atenolol (TENORMIN) 25 MG tablet TAKE 1 TABLET(25 MG) BY MOUTH TWICE DAILY 180 tablet 3  . Calcium Carbonate-Vitamin D3 (CALCIUM 600-D) 600-400 MG-UNIT TABS Take 2 tablets by  mouth daily.     Marland Kitchen DILT-XR 240 MG 24 hr capsule TAKE 1 CAPSULE(240 MG) BY MOUTH DAILY 90 capsule 2  . etodolac (LODINE) 400 MG tablet Take 400 mg by mouth 2 (two) times daily.    . fluticasone (FLONASE) 50 MCG/ACT nasal spray Place 1 spray into both nostrils daily as needed for allergies.     Marland Kitchen gabapentin (NEURONTIN) 300 MG capsule Take 300 mg by mouth 2 (two) times daily.   2  . hydrochlorothiazide (HYDRODIURIL) 25 MG tablet Take 25 mg by mouth daily.    Marland Kitchen levothyroxine (SYNTHROID, LEVOTHROID) 100 MCG tablet TAKE 1 TABLET(100 MCG) BY MOUTH DAILY BEFORE BREAKFAST 30 tablet 9  . Multiple Vitamin (MULTIVITAMIN WITH MINERALS) TABS tablet Take 1 tablet by mouth daily.    . Multiple Vitamins-Minerals (PRESERVISION AREDS 2 PO) Take 1 capsule by mouth 2 (two) times daily.    . pantoprazole (PROTONIX) 40 MG tablet Take 40 mg by mouth daily.     . pravastatin (PRAVACHOL) 10 MG tablet Take 1 tablet (10 mg total) by mouth daily. 90 tablet 2   No current facility-administered medications for this visit.     SURGICAL HISTORY:  Past Surgical History:  Procedure Laterality Date  . basal cell and squamous cell skin cell area removed     new left lower eye lid done with skin graft   . CATARACT EXTRACTION, BILATERAL Bilateral   . COLONOSCOPY WITH PROPOFOL N/A 10/27/2016   Procedure: COLONOSCOPY WITH PROPOFOL;  Surgeon: Laurence Spates, MD;  Location: [facility];  Service: Endoscopy;  Laterality: N/A;  . conization for cervical dysplasia    . esophogus stretching    . IR GENERIC HISTORICAL  09/11/2014   IR RADIOLOGIST EVAL & MGMT 09/11/2014 Jacqulynn Cadet, MD [facility] INTERV RAD  . radio frequency ablation on lung spot    . rul lobectomy  2010  . TONSILLECTOMY      REVIEW OF SYSTEMS:  A comprehensive review of systems was negative.   PHYSICAL EXAMINATION: General appearance: alert, cooperative and no distress Head: Normocephalic, without obvious abnormality, atraumatic Neck: no adenopathy Lymph nodes:  Cervical, supraclavicular, and axillary nodes normal. Resp: clear to auscultation bilaterally Back: symmetric, no curvature. ROM normal. No CVA tenderness. Cardio: regular rate and rhythm, S1, S2 normal, no murmur, click, rub or gallop GI: soft, non-tender; bowel sounds normal; no masses,  no organomegaly Extremities: extremities normal, atraumatic, no cyanosis or edema  ECOG PERFORMANCE STATUS: 1 - Symptomatic but completely ambulatory  Blood pressure (!) 153/63, pulse 73, temperature 98 F (36.7 C), temperature source Oral, resp. rate 18, height '5\' 4"'  (1.626 m), weight 142 lb (64.4 kg), SpO2 99 %.  LABORATORY DATA: Lab Results  Component Value Date   WBC 8.1 05/29/2017   HGB 12.3 05/29/2017   HCT 37.9 05/29/2017   MCV 91.0 05/29/2017   PLT 238 05/29/2017      Chemistry      Component Value Date/Time   NA 141 05/29/2017 1136   K 4.5 05/29/2017 1136   CL 104 04/19/2016 1103   CL 105 07/17/2012 1434  CO2 28 05/29/2017 1136   BUN 26.1 (H) 05/29/2017 1136   CREATININE 1.3 (H) 05/29/2017 1136      Component Value Date/Time   CALCIUM 10.4 05/29/2017 1136   ALKPHOS 59 05/29/2017 1136   AST 15 05/29/2017 1136   ALT 12 05/29/2017 1136   BILITOT 0.51 05/29/2017 1136       RADIOGRAPHIC STUDIES: Ct Chest W Contrast  Result Date: 05/29/2017 CLINICAL DATA:  Lung cancer diagnosed 2010 with resection. Radiation therapy complete. EXAM: CT CHEST WITH CONTRAST TECHNIQUE: Multidetector CT imaging of the chest was performed during intravenous contrast administration. CONTRAST:  70m ISOVUE-300 IOPAMIDOL (ISOVUE-300) INJECTION 61% COMPARISON:  03/01/2017 FINDINGS: Cardiovascular: Coronary artery calcification and aortic atherosclerotic calcification. Mediastinum/Nodes: No axillary or supraclavicular adenopathy. Mediastinal hilar adenopathy. Lungs/Pleura: LEFT suprahilar mass measures 3.9 by 2.0 cm compared to 3.7 x 2.0 cm on prior for no significant change. Nodular lesion in the superior  segment of the LEFT lower lobe measures 2.0 x 1.3 cm compared with 1.7 by of 1.1 cm (image 43, series 5). Ground-glass nodule in the LEFT lower lobe measures 2.9 by 1.7 cm compared to 2.3 x 1.7 cm. Nodule peripheral to the LEFT suprahilar mass measures 10 mm compared to 10 mm (image 53, series 5). No new pulmonary nodules. In the superior segment RIGHT lower lobe there several scattered ground-glass nodules unchanged prior. Upper Abdomen: Thickened adrenal gland similar prior. Hiatal hernia. Musculoskeletal: No aggressive osseous lesion IMPRESSION: 1. LEFT suprahilar mass similar to prior. 2. Nodule in superior segment of the LEFT lower lobe measures larger and concerning for metastatic progression /recurrence. 3. Stable ground-glass nodules in the LEFT lung and RIGHT lung. Electronically Signed   By: SSuzy BouchardM.D.   On: 05/29/2017 14:42   ASSESSMENT AND PLAN:  This is a very pleasant 76years old white female with recurrent non-small cell lung cancer, adenocarcinoma with questionable EGFR mutation. The patient was treated in the past with Tarceva for a few weeks but this was discontinued secondary to internal returns and significant fatigue and the patient refused to resume any further treatment at that time. She underwent several local treatment including stereotactic radiotherapy as well as radiofrequency ablation to the current tumor in the left upper lobe. The patient has no complaints today. Her recent CT scan of the chest showed stable disease except for left lower lobe pulmonary nodule that is slightly increased in size. I personally and independently reviewed the scan images and discussed the results and showed the images to the patient today. After discussion we decided to continue the patient on observation with close monitoring of these pulmonary nodules. If she has more evidence for disease progression, I may consider referring her to interventional radiology for consideration of  radiofrequency ablation of the enlarging lesion. The patient agreed to the current plan. For hypertension, she was advised to monitor her blood pressure closely at home and to reconsult with her primary care physician for adjustment of her medication. The patient was advised to call immediately if she has any other concerning symptoms in the interval. All questions were answered. The patient knows to call the clinic with any problems, questions or concerns. We can certainly see the patient much sooner if necessary. I spent 10 minutes counseling the patient face to face. The total time spent in the appointment was 15 minutes.  Disclaimer: This note was dictated with voice recognition software. Similar sounding words can inadvertently be transcribed and may not be corrected upon review.

## 2017-06-05 NOTE — Telephone Encounter (Signed)
Patient was already scheduled. 

## 2017-06-28 DIAGNOSIS — I10 Essential (primary) hypertension: Secondary | ICD-10-CM | POA: Diagnosis not present

## 2017-07-14 ENCOUNTER — Other Ambulatory Visit: Payer: Self-pay | Admitting: Cardiology

## 2017-07-14 DIAGNOSIS — E039 Hypothyroidism, unspecified: Secondary | ICD-10-CM

## 2017-07-17 NOTE — Telephone Encounter (Signed)
Pt's pharmacy is requesting a refill on levothyroxine. Would you like to refill this medication? Please advise

## 2017-07-24 DIAGNOSIS — H02055 Trichiasis without entropian left lower eyelid: Secondary | ICD-10-CM | POA: Diagnosis not present

## 2017-08-13 ENCOUNTER — Other Ambulatory Visit: Payer: Self-pay | Admitting: Cardiology

## 2017-08-13 DIAGNOSIS — E039 Hypothyroidism, unspecified: Secondary | ICD-10-CM

## 2017-08-16 DIAGNOSIS — Z1231 Encounter for screening mammogram for malignant neoplasm of breast: Secondary | ICD-10-CM | POA: Diagnosis not present

## 2017-08-22 ENCOUNTER — Ambulatory Visit (INDEPENDENT_AMBULATORY_CARE_PROVIDER_SITE_OTHER): Payer: Medicare Other | Admitting: Cardiology

## 2017-08-22 ENCOUNTER — Encounter: Payer: Self-pay | Admitting: Cardiology

## 2017-08-22 VITALS — BP 150/90 | HR 66 | Ht 64.0 in | Wt 140.0 lb

## 2017-08-22 DIAGNOSIS — E785 Hyperlipidemia, unspecified: Secondary | ICD-10-CM | POA: Diagnosis not present

## 2017-08-22 DIAGNOSIS — R0609 Other forms of dyspnea: Secondary | ICD-10-CM

## 2017-08-22 DIAGNOSIS — I447 Left bundle-branch block, unspecified: Secondary | ICD-10-CM | POA: Diagnosis not present

## 2017-08-22 LAB — COMPREHENSIVE METABOLIC PANEL
ALT: 11 IU/L (ref 0–32)
AST: 13 IU/L (ref 0–40)
Albumin/Globulin Ratio: 2.2 (ref 1.2–2.2)
Albumin: 4.2 g/dL (ref 3.5–4.8)
Alkaline Phosphatase: 73 IU/L (ref 39–117)
BUN/Creatinine Ratio: 18 (ref 12–28)
BUN: 27 mg/dL (ref 8–27)
Bilirubin Total: 0.5 mg/dL (ref 0.0–1.2)
CO2: 25 mmol/L (ref 20–29)
Calcium: 10.5 mg/dL — ABNORMAL HIGH (ref 8.7–10.3)
Chloride: 98 mmol/L (ref 96–106)
Creatinine, Ser: 1.48 mg/dL — ABNORMAL HIGH (ref 0.57–1.00)
GFR calc Af Amer: 39 mL/min/{1.73_m2} — ABNORMAL LOW (ref >59)
GFR calc non Af Amer: 34 mL/min/{1.73_m2} — ABNORMAL LOW (ref >59)
Globulin, Total: 1.9 g/dL (ref 1.5–4.5)
Glucose: 85 mg/dL (ref 65–99)
Potassium: 4.6 mmol/L (ref 3.5–5.2)
Sodium: 140 mmol/L (ref 134–144)
Total Protein: 6.1 g/dL (ref 6.0–8.5)

## 2017-08-22 LAB — LIPID PANEL
Chol/HDL Ratio: 3.6 ratio (ref 0.0–4.4)
Cholesterol, Total: 193 mg/dL (ref 100–199)
HDL: 53 mg/dL (ref >39)
LDL Calculated: 113 mg/dL — ABNORMAL HIGH (ref 0–99)
Triglycerides: 135 mg/dL (ref 0–149)
VLDL Cholesterol Cal: 27 mg/dL (ref 5–40)

## 2017-08-22 MED ORDER — DILTIAZEM HCL ER COATED BEADS 360 MG PO CP24: 360.0000 mg | ORAL_CAPSULE | Freq: Every day | ORAL | 2 refills | Status: DC

## 2017-08-22 NOTE — Progress Notes (Signed)
Cardiology Office Note    Date:  08/22/2017  ID:  [name], DOB 10/16/1940, MRN [id number] PCP:  Wenda Low, MD  Cardiologist:  Ron Parker -> Dr. Meda Coffee   Chief Complaint: SOB, fatigue  History of Present Illness:  [name] is a 77 y.o. female with history of LBBB, nonsmall cell lung cancer (s/p RULectomy 2009, radiation to LUL nodule 2013, radiation to lymphadenopathy 2015, Tarceva rx, thermal ablation of recurrent LUL adenocarcinoma 2016), arthritis, cervical CA 1988 s/p surgery, HTN, hypothyroidism, macular degeneration, melanoma of leg, intention tremor, probable CKD stage III, coronary calcification on CT who presents for f/u. Previously followed by Dr. Ron Parker - underwent cardiac catheterization in the 80s that showed no significant coronary artery disease. Carotid US 2014 showed 1-39% stenosis. Husband has metastatic kidney cancer to the brain.  She was seen in the fall for dyspnea at which time Dr. Meda Coffee added amlodipine. She recommended to start statin therapy given coronary calcification noted on CT scanning as well as levothyroxine given abnormal TSH. The patient has a history of being very sensitive to medications so she started each med in a stepwise progression. At one point her HCTZ was discontinued due to elevated Cr. F/u TFTs 04/2016 wnl, LDL 147 prior to initiation of atorvastatin.  Subsequent labs 05/2016: Cr 1.1, K 4.3, albumin 2.9, Hgb 11.9, LDL 104. She did not tolerate even low dose amlodipine due to fatigue. [name] continues to notice occasional fluctuations in her BP mostly from 784-696 systolic range. She also continues to complain of generalized fatigue and dyspnea. She underwent a nuclear stress test that showed no ischemia, scar in the septal wall,  h/o infarct, not sure if it is real or artifact. I would start with ordering an echocardiogram to evaluate for WMA and LVEF.  There have been a few nights where she wakes up from sleep feeling a sensation of  something on her chest. She says it does not feel like chest pain, just an unusual sensation. She has h/o GERD so she's not sure if that's contributing. The patient has since seen her PCP who restarted HCTZ, asked her to wean off amlodipine, started iron supp for mild anemia, and recommended she switch Pepcid to a PPI (she's unsure of the name, possibly Protonix). She has f/u with PCP in several weeks to revisit.  10/07/2016 - 3 months follow up, the patient states that she feels better, her BP has been better controlled, she denies any chest pain, DOE, no palpitations, dizziness or syncope. She stopped taking atorvastatin as that caused her muscle pains.   08/22/2017 - 1 year follow up, the patient has been under significant stress as her husband passed away from kidney cancer with brain mets in July 2018. Her BP has been elevated ever since. She has tried amlodipine, lisinopril, losartan, lowest dosage doesn't touch her BP, higher makes her very tired. She has been undergoing chest CTA Q9months, she had lung cancer removed in 2009 and several lums, s/p mutliple chemos and radiations since then. She has noticed worsening DOE since the last year, no chest pain.     Past Medical History:  Diagnosis Date  . Arthritis    back.and feet  . Cervical ca ([place]) dx'd 1988   surg only  . CKD (chronic kidney disease), stage III ([place])   . Complication of anesthesia    difficulty awakening, dysrhythmia post surgery, (prolonged sedation level)  . Coronary artery calcification seen on CT scan   . Headache  occ. sinus type headaches  . Hx of radiation therapy 09/16/13-09/30/13   prevascular lymph node  . Hyperlipidemia   . Hypertension   . Hyperthyroidism    dr Loanne Drilling- radioactive iodine treatment 2011- resolved  . Hypothyroidism   . LBBB (left bundle branch block)   . Lung cancer ([place])    Bilaterally, RUL lobectomy and spot involving both lungs- "cancer"  . Macular degeneration    bilateral  .  Melanoma ([place])    left leg  . Radiation 10/11/11-10/21/11   Left upper lobe adenocarcinoma  . Tremor    hand- intention tremor    Past Surgical History:  Procedure Laterality Date  . basal cell and squamous cell skin cell area removed     new left lower eye lid done with skin graft   . CATARACT EXTRACTION, BILATERAL Bilateral   . COLONOSCOPY WITH PROPOFOL N/A 10/27/2016   Procedure: COLONOSCOPY WITH PROPOFOL;  Surgeon: Laurence Spates, MD;  Location: [facility];  Service: Endoscopy;  Laterality: N/A;  . conization for cervical dysplasia    . esophogus stretching    . IR GENERIC HISTORICAL  09/11/2014   IR RADIOLOGIST EVAL & MGMT 09/11/2014 Jacqulynn Cadet, MD [facility] INTERV RAD  . radio frequency ablation on lung spot    . rul lobectomy  2010  . TONSILLECTOMY      Current Medications: Current Outpatient Medications  Medication Sig Dispense Refill  . acetaminophen (TYLENOL) 650 MG CR tablet Take 1,300 mg by mouth 2 (two) times daily.     Marland Kitchen aspirin EC 81 MG tablet Take 1 tablet (81 mg total) by mouth daily. 90 tablet 3  . atenolol (TENORMIN) 25 MG tablet TAKE 1 TABLET(25 MG) BY MOUTH TWICE DAILY 180 tablet 3  . Calcium Carbonate-Vitamin D3 (CALCIUM 600-D) 600-400 MG-UNIT TABS Take 2 tablets by mouth daily.     Marland Kitchen DILT-XR 240 MG 24 hr capsule TAKE 1 CAPSULE(240 MG) BY MOUTH DAILY 90 capsule 0  . etodolac (LODINE) 400 MG tablet Take 400 mg by mouth 2 (two) times daily.    . fluticasone (FLONASE) 50 MCG/ACT nasal spray Place 1 spray into both nostrils daily as needed for allergies.     Marland Kitchen gabapentin (NEURONTIN) 300 MG capsule Take 300 mg by mouth 2 (two) times daily.   2  . hydrochlorothiazide (HYDRODIURIL) 25 MG tablet Take 25 mg by mouth daily.    Marland Kitchen levothyroxine (SYNTHROID, LEVOTHROID) 100 MCG tablet TAKE 1 TABLET(100 MCG) BY MOUTH DAILY BEFORE BREAKFAST 30 tablet 0  . Multiple Vitamin (MULTIVITAMIN WITH MINERALS) TABS tablet Take 1 tablet by mouth daily.    . Multiple Vitamins-Minerals  (PRESERVISION AREDS 2 PO) Take 1 capsule by mouth 2 (two) times daily.    . pantoprazole (PROTONIX) 40 MG tablet Take 40 mg by mouth daily.     Marland Kitchen lisinopril (PRINIVIL,ZESTRIL) 10 MG tablet Take 10 mg by mouth daily.  3  . pravastatin (PRAVACHOL) 10 MG tablet Take 1 tablet (10 mg total) by mouth daily. (Patient not taking: Reported on 08/22/2017) 90 tablet 2   No current facility-administered medications for this visit.      Allergies:   Meperidine hcl; Penicillins; Morphine; Oxycodone-acetaminophen; Propoxyphene n-acetaminophen; and Tramadol hcl   Social History   Socioeconomic History  . Marital status: Widowed    Spouse name: None  . Number of children: 2  . Years of education: None  . Highest education level: None  Social Needs  . Financial resource strain: None  . Food insecurity -  worry: None  . Food insecurity - inability: None  . Transportation needs - medical: None  . Transportation needs - non-medical: None  Occupational History  . Occupation: homemaker    Employer: RETIRED  Tobacco Use  . Smoking status: Former Smoker    Packs/day: 1.00    Years: 17.00    Pack years: 17.00    Types: Cigarettes    Last attempt to quit: 09/28/1975    Years since quitting: 41.9  . Smokeless tobacco: Never Used  . Tobacco comment: 33 yrs ago  Substance and Sexual Activity  . Alcohol use: Yes    Comment: glass wine daily  . Drug use: No  . Sexual activity: None  Other Topics Concern  . None  Social History Personal assistant   Married 1963   4 grandchildren and 2 step-grandchildren   Marriage in good health            Physician Roster:   Oncologist- Dr Julien Nordmann   Surgeon- Dr Lindell Spar- Dr Mable Fill- Dr Allyson Sabal     Family History:  The patient's family history includes Asthma in her mother; Cancer in her maternal grandfather and maternal uncle; Heart disease in her mother; Thyroid disease in her daughter.   ROS:   Please see the history of present illness.    All other systems are reviewed and otherwise negative.    PHYSICAL EXAM:   VS:  BP (!) 150/90 (BP Location: Left Arm, Patient Position: Sitting, Cuff Size: Normal)   Pulse 66   Ht 5\' 4"  (1.626 m)   Wt 140 lb (63.5 kg)   SpO2 95%   BMI 24.03 kg/m   BMI: Body mass index is 24.03 kg/m. GEN: Well nourished, well developed WF, in no acute distress  HEENT: normocephalic, atraumatic Neck: no JVD, carotid bruits, or masses Cardiac: RRR; no murmurs, rubs, or gallops, no edema  Respiratory:  clear to auscultation bilaterally, normal work of breathing GI: soft, nontender, nondistended, + BS MS: no deformity or atrophy  Skin: warm and dry, no rash Neuro:  Alert and Oriented x 3, Strength and sensation are intact, follows commands Psych: euthymic mood, full affect  Wt Readings from Last 3 Encounters:  08/22/17 140 lb (63.5 kg)  06/05/17 142 lb (64.4 kg)  03/02/17 140 lb 14.4 oz (63.9 kg)      Studies/Labs Reviewed:   EKG:  EKG was ordered today and personally reviewed by me and demonstrates NSR 66 bpm, left axis deviation, left bundle branch block, no changes from prior  Recent Labs: 05/29/2017: ALT 12; BUN 26.1; Creatinine 1.3; HGB 12.3; Platelets 238; Potassium 4.5; Sodium 141   Lipid Panel    Component Value Date/Time   CHOL 184 04/12/2016 0841   TRIG 193 (H) 04/12/2016 0841   HDL 41 (L) 04/12/2016 0841   CHOLHDL 4.5 04/12/2016 0841   VLDL 39 (H) 04/12/2016 0841   LDLCALC 104 04/12/2016 0841   LDLDIRECT 137.6 05/17/2013 0937   Additional studies/ records that were reviewed today include: Summarized above.    ASSESSMENT & PLAN:   1. Dyspnea with known coronary calcium on CT - She underwent a nuclear stress test that showed no ischemia, scar in the septal wall(2018),  Diff dg included infarct vs artifact. Echocardiogram showed LVEF is approximately 55% with septal hypokinesis. She has worsening DOE, we will order a coronary CTA/CT FFR 1-2 weeks post chest CTA to avoid  contrast nephropathy.Crea 1.3.  2. Hypertension -  increase cardizem CD to 360 mg po daily, follow up in 2 weeks with pharmacist for BP and HR check up.  3. Hyperlipidemia - intolerant to atorvastatin, started on pravastatin 10 mg po daily, check lipids.  Disposition: F/u with Dr. Meda Coffee in 6 months  Medication Adjustments/Labs and Tests Ordered: Current medicines are reviewed at length with the patient today.  Concerns regarding medicines are outlined above. Medication changes, Labs and Tests ordered today are summarized above and listed in the Patient Instructions accessible in Encounters.   Signed, Ena Dawley, MD 08/22/2017 11:25 AM    [address], [place], [place]  [address] Phone: [phone]; Fax: [phone]

## 2017-08-22 NOTE — Patient Instructions (Addendum)
Medication Instructions:   INCREASE YOUR DILTIAZEM CD (CARDIZEM CD) TO 360 MG ONCE DAILY     Your physician recommends that you return for lab work in: TODAY--LIPID AND CMET     Testing/Procedures:   *CORONARY CT WITH FFR--PLEASE MAKE SURE THIS IS NOT SCHEDULED UNTIL AROUND THE BEGINNING OF MARCH 2019  Please arrive at the [facility] main entrance of [facility] at xx:xx AM (30-45 minutes prior to test start time)  [facility] [place], [place] [address] [phone]  Proceed to the [facility] Radiology Department (First Floor).  Please follow these instructions carefully (unless otherwise directed):   On the Night Before the Test: . Drink plenty of water. . Do not consume any caffeinated/decaffeinated beverages or chocolate 12 hours prior to your test. . Do not take any antihistamines 12 hours prior to your test.   On the Day of the Test: . Drink plenty of water. Do not drink any water within one hour of the test. . Do not eat any food 4 hours prior to the test. . You may take your regular medications prior to the test.   After the Test: . Drink plenty of water. . After receiving IV contrast, you may experience a mild flushed feeling. This is normal. . On occasion, you may experience a mild rash up to 24 hours after the test. This is not dangerous. If this occurs, you can take Benadryl 25 mg and increase your fluid intake. . If you experience trouble breathing, this can be serious. If it is severe call 911 IMMEDIATELY. If it is mild, please call our office.     Follow-Up:  WITH MEGAN SUPPLE OR KELLY AUTEN PHARMACIST IN 2 WEEKS IN OUR BP CLINIC   Your physician wants you to follow-up in: [place] will receive a reminder letter in the mail two months in advance. If you don't receive a letter, please call our office to schedule the follow-up appointment.      If you need a refill on your cardiac  medications before your next appointment, please call your pharmacy.

## 2017-08-28 ENCOUNTER — Telehealth: Payer: Self-pay | Admitting: *Deleted

## 2017-08-28 MED ORDER — PRAVASTATIN SODIUM 20 MG PO TABS: 20.0000 mg | ORAL_TABLET | Freq: Every evening | ORAL | 3 refills | Status: DC

## 2017-08-28 MED ORDER — PRAVASTATIN SODIUM 10 MG PO TABS: 10.0000 mg | ORAL_TABLET | Freq: Every day | ORAL | 2 refills | Status: DC

## 2017-08-28 NOTE — Telephone Encounter (Signed)
-----   Message from Dorothy Spark, MD sent at 08/28/2017 10:47 AM EST ----- plesae increase to 20 mg po daily

## 2017-08-28 NOTE — Telephone Encounter (Signed)
-----   Message from Dorothy Spark, MD sent at 08/25/2017  9:56 PM EST ----- Her crea is slightly increased, please advise to stay hydrated especially prior to CTs and afterwards. Lipids are higher than previously, LDL 104 -->113, please try to increae pravastatin to 20 mg po daily and see if she tolerates it.

## 2017-08-28 NOTE — Telephone Encounter (Signed)
Endorsed to the pt that Dr Meda Coffee highly recommends that she increase her pravastatin to 20 mg po daily.  Pt currently has 10 mg pravastatin at the pharmacy, so she states she will take 2 of those to get 20 mg. Informed the pt that I will go ahead and order for the pravastatin 20 mg po daily, and put in the pharmacy note for them to fill later when the pt calls.  Pt verbalized understanding and agrees with this plan.

## 2017-08-28 NOTE — Telephone Encounter (Signed)
Spoke with the pt and endorsed her lab results and recommendations per Dr Meda Coffee.  Pt states that she hasn't been taking her Pravastatin 10 mg po daily in months, and would like to resume at that dose, before increasing.  Pt states that she would like for Dr Meda Coffee to be aware that she will resume at her current dose of pravastatin 10 mg po daily, vs increasing it to 20 mg, if that's alright with her.  Informed the pt that she needs to start back taking her pravastatin 10 mg po daily and I will make Dr Meda Coffee aware of her decision to do this.  Pt verbalized understanding and agrees with this plan.

## 2017-09-04 ENCOUNTER — Other Ambulatory Visit: Payer: Medicare Other

## 2017-09-04 ENCOUNTER — Encounter (HOSPITAL_COMMUNITY): Payer: Self-pay

## 2017-09-04 ENCOUNTER — Inpatient Hospital Stay: Payer: Medicare Other | Attending: Internal Medicine

## 2017-09-04 ENCOUNTER — Ambulatory Visit (HOSPITAL_COMMUNITY)
Admission: RE | Admit: 2017-09-04 | Discharge: 2017-09-04 | Disposition: A | Discharge status: Home / Self Care | Payer: Medicare Other | Source: Ambulatory Visit | Attending: Internal Medicine | Admitting: Internal Medicine

## 2017-09-04 DIAGNOSIS — K449 Diaphragmatic hernia without obstruction or gangrene: Secondary | ICD-10-CM | POA: Insufficient documentation

## 2017-09-04 DIAGNOSIS — C3490 Malignant neoplasm of unspecified part of unspecified bronchus or lung: Secondary | ICD-10-CM | POA: Diagnosis not present

## 2017-09-04 DIAGNOSIS — C3491 Malignant neoplasm of unspecified part of right bronchus or lung: Secondary | ICD-10-CM | POA: Diagnosis not present

## 2017-09-04 DIAGNOSIS — C3492 Malignant neoplasm of unspecified part of left bronchus or lung: Secondary | ICD-10-CM | POA: Diagnosis not present

## 2017-09-04 DIAGNOSIS — J439 Emphysema, unspecified: Secondary | ICD-10-CM | POA: Insufficient documentation

## 2017-09-04 DIAGNOSIS — R918 Other nonspecific abnormal finding of lung field: Secondary | ICD-10-CM | POA: Diagnosis not present

## 2017-09-04 DIAGNOSIS — I2510 Atherosclerotic heart disease of native coronary artery without angina pectoris: Secondary | ICD-10-CM | POA: Insufficient documentation

## 2017-09-04 DIAGNOSIS — I700 Atherosclerosis of aorta: Secondary | ICD-10-CM | POA: Insufficient documentation

## 2017-09-04 DIAGNOSIS — C3412 Malignant neoplasm of upper lobe, left bronchus or lung: Secondary | ICD-10-CM | POA: Insufficient documentation

## 2017-09-04 LAB — CBC WITH DIFFERENTIAL/PLATELET
BASOS ABS: 0.1 10*3/uL (ref 0.0–0.1)
Basophils Relative: 1 %
EOS ABS: 1.5 10*3/uL — AB (ref 0.0–0.5)
EOS PCT: 11 %
HCT: 34.5 % — ABNORMAL LOW (ref 34.8–46.6)
Hemoglobin: 10.5 g/dL — ABNORMAL LOW (ref 11.6–15.9)
LYMPHS ABS: 2.1 10*3/uL (ref 0.9–3.3)
LYMPHS PCT: 15 %
MCH: 26.5 pg (ref 25.1–34.0)
MCHC: 30.4 g/dL — ABNORMAL LOW (ref 31.5–36.0)
MCV: 87.1 fL (ref 79.5–101.0)
Monocytes Absolute: 1.6 10*3/uL — ABNORMAL HIGH (ref 0.1–0.9)
Monocytes Relative: 12 %
NEUTROS PCT: 61 %
Neutro Abs: 8.2 10*3/uL — ABNORMAL HIGH (ref 1.5–6.5)
PLATELETS: 306 10*3/uL (ref 145–400)
RBC: 3.96 MIL/uL (ref 3.70–5.45)
RDW: 13.8 % (ref 11.2–14.5)
WBC: 13.5 10*3/uL — ABNORMAL HIGH (ref 3.9–10.3)

## 2017-09-04 LAB — COMPREHENSIVE METABOLIC PANEL
ALT: 11 U/L (ref 0–55)
ANION GAP: 8 (ref 3–11)
AST: 13 U/L (ref 5–34)
Albumin: 3.2 g/dL — ABNORMAL LOW (ref 3.5–5.0)
Alkaline Phosphatase: 79 U/L (ref 40–150)
BUN: 24 mg/dL (ref 7–26)
CALCIUM: 10.3 mg/dL (ref 8.4–10.4)
CHLORIDE: 104 mmol/L (ref 98–109)
CO2: 29 mmol/L (ref 22–29)
CREATININE: 1.28 mg/dL — AB (ref 0.60–1.10)
GFR calc non Af Amer: 40 mL/min — ABNORMAL LOW (ref >60)
GFR, EST AFRICAN AMERICAN: 46 mL/min — AB (ref >60)
Glucose, Bld: 102 mg/dL (ref 70–140)
Potassium: 4 mmol/L (ref 3.5–5.1)
SODIUM: 141 mmol/L (ref 136–145)
Total Bilirubin: 0.5 mg/dL (ref 0.2–1.2)
Total Protein: 6.3 g/dL — ABNORMAL LOW (ref 6.4–8.3)

## 2017-09-04 MED ORDER — IOPAMIDOL (ISOVUE-300) INJECTION 61%
75.0000 mL | Freq: Once | INTRAVENOUS | Status: AC | PRN
  Administered 2017-09-04: 60 mL via INTRAVENOUS

## 2017-09-04 MED ORDER — IOPAMIDOL (ISOVUE-300) INJECTION 61%
INTRAVENOUS | Status: AC
  Filled 2017-09-04: qty 75

## 2017-09-05 ENCOUNTER — Ambulatory Visit: Payer: Medicare Other | Admitting: Internal Medicine

## 2017-09-07 ENCOUNTER — Ambulatory Visit (INDEPENDENT_AMBULATORY_CARE_PROVIDER_SITE_OTHER): Payer: Medicare Other | Admitting: Pharmacist

## 2017-09-07 VITALS — BP 138/66 | HR 68

## 2017-09-07 DIAGNOSIS — I10 Essential (primary) hypertension: Secondary | ICD-10-CM

## 2017-09-07 MED ORDER — PRAVASTATIN SODIUM 20 MG PO TABS: 20.0000 mg | ORAL_TABLET | Freq: Every evening | ORAL | 11 refills | Status: DC

## 2017-09-07 NOTE — Patient Instructions (Addendum)
Move your diltiazem to the evening to see if this helps decrease your morning blood pressure readings  Continue taking your atenolol and hydrochlorothiazide  Your blood pressure looked great today. If your readings stay elevated, we could consider starting low dose hydralazine  Please follow up with your primary care doctor or oncologist about your hemoglobin to see if they would like to start you on an iron supplement.  On 05/29/17, your hemoglobin was 12.3 On 09/04/17, your hemoglobin was 10.5

## 2017-09-07 NOTE — Progress Notes (Signed)
Patient ID: [name]                 DOB: 1940-09-19                      MRN: [id number]     HPI: [name] is a 77 y.o. female referred by Dr. Meda Coffee to HTN clinic. PMH is significant for HTN, probable CKD stage III, carotid ultrasound 2014 showing 1-39% stenosis, hypothyroidism, LBBB, GERD, cervical cancer, melanoma of leg, and nonsmall cell lung cancer. Her husband passed away from kidney cancer with brain mets in July 2018. Since then, patient has been under significant stress and BP has remained elevated. She has tried amlodipine, lisinopril, and losartan in the past. Low doses have not controlled her BP and she feels fatigued on higher doses. At her last visit 2 weeks ago, BP was elevated at 150/31mmHg and diltiazem was increased to 360mg  daily. Patient presents today for HTN follow up.  Pt reports continued generalized fatigue that has worsened over the past month. Hgb has dropped from 12.3 to 10.5 in the past 2 months which may be contributing to worsening fatigue. Pt reports she previously took iron supplementation for anemia.  She felt poorly on her higher diltiazem dose - reported fatigue that worsened over the 2 week span she took the higher dose. She reduced her dose back to the 240mg  earlier this week and feels better now. She reports an improvement in fatigue after making this change. She is concerned about her kidney function since her SCr has remained elevated over the past 8-9 years. She uses etodolac twice daily for the past 5+ years in addition to Tylenol and gabapentin for her scoliosis and degenerative disk pain. Discussed that frequent NSAID use may increase BP and worsen kidney function, however pt has trouble walking when she decreases her etodolac dose.   Home readings have remained elevated 140-180/70-80s. She does not report any blurred vision or headaches with higher BP readings, and does not feel dizzy when systolic BP is in the 546-270J. She has had her home bicep  BP cuff for many years. She does not add salt to her food but does like foods that are higher in sodium - deli meat, soup, and [employer]ines.  Current HTN meds: atenolol 25mg  BID, HCTZ 25mg  daily, diltiazem 240mg  daily Previously tried: amlodipine 2.5mg  and 5mg , lisinopril 10mg , losartan 50mg  - fatigued, dizzy, nauseous with all, diltiazem 360mg  dose - headache, fatigue, clonidine - extreme fatigue BP goal: <140/59mmHg  Family History: The patient's family history includes Asthma in her mother; Cancer in her maternal grandfather and maternal uncle; Heart disease in her mother; Thyroid disease in her daughter.   Social History: Former smoker 1 PPD for 17 years, quit in 1977. Drinks 1 glass of wine daily, denies illicit drug use.  Diet: Breakfast - egg and piece of toast, oatmeal, cereal, or yogurt. Lunch - sandwich with deli meat. Likes [employer] and soup.   Exercise: Minimal - degenerative disk and chronic pain  Home BP readings: 140-180/70-80s  Labs: K 4, SCr 1.28, CrCl 37  Wt Readings from Last 3 Encounters:  08/22/17 140 lb (63.5 kg)  06/05/17 142 lb (64.4 kg)  03/02/17 140 lb 14.4 oz (63.9 kg)   BP Readings from Last 3 Encounters:  08/22/17 (!) 150/90  06/05/17 (!) 153/63  03/02/17 (!) 187/67   Pulse Readings from Last 3 Encounters:  08/22/17 66  06/05/17 73  03/02/17 74  Renal function: CrCl cannot be calculated (Unknown ideal weight.).  Past Medical History:  Diagnosis Date  . Arthritis    back.and feet  . Cervical ca ([place]) dx'd 1988   surg only  . CKD (chronic kidney disease), stage III ([place])   . Complication of anesthesia    difficulty awakening, dysrhythmia post surgery, (prolonged sedation level)  . Coronary artery calcification seen on CT scan   . Headache    occ. sinus type headaches  . Hx of radiation therapy 09/16/13-09/30/13   prevascular lymph node  . Hyperlipidemia   . Hypertension   . Hyperthyroidism    dr Loanne Drilling- radioactive iodine  treatment 2011- resolved  . Hypothyroidism   . LBBB (left bundle branch block)   . Lung cancer ([place])    Bilaterally, RUL lobectomy and spot involving both lungs- "cancer"  . Macular degeneration    bilateral  . Melanoma ([place])    left leg  . Radiation 10/11/11-10/21/11   Left upper lobe adenocarcinoma  . Tremor    hand- intention tremor    Current Outpatient Medications on File Prior to Visit  Medication Sig Dispense Refill  . acetaminophen (TYLENOL) 650 MG CR tablet Take 1,300 mg by mouth 2 (two) times daily.     Marland Kitchen aspirin EC 81 MG tablet Take 1 tablet (81 mg total) by mouth daily. 90 tablet 3  . atenolol (TENORMIN) 25 MG tablet TAKE 1 TABLET(25 MG) BY MOUTH TWICE DAILY 180 tablet 3  . Calcium Carbonate-Vitamin D3 (CALCIUM 600-D) 600-400 MG-UNIT TABS Take 2 tablets by mouth daily.     Marland Kitchen diltiazem (CARDIZEM CD) 360 MG 24 hr capsule Take 1 capsule (360 mg total) by mouth daily. 90 capsule 2  . etodolac (LODINE) 400 MG tablet Take 400 mg by mouth 2 (two) times daily.    . fluticasone (FLONASE) 50 MCG/ACT nasal spray Place 1 spray into both nostrils daily as needed for allergies.     Marland Kitchen gabapentin (NEURONTIN) 300 MG capsule Take 300 mg by mouth 2 (two) times daily.   2  . hydrochlorothiazide (HYDRODIURIL) 25 MG tablet Take 25 mg by mouth daily.    Marland Kitchen levothyroxine (SYNTHROID, LEVOTHROID) 100 MCG tablet TAKE 1 TABLET(100 MCG) BY MOUTH DAILY BEFORE BREAKFAST 30 tablet 0  . lisinopril (PRINIVIL,ZESTRIL) 10 MG tablet Take 10 mg by mouth daily.  3  . Multiple Vitamin (MULTIVITAMIN WITH MINERALS) TABS tablet Take 1 tablet by mouth daily.    . Multiple Vitamins-Minerals (PRESERVISION AREDS 2 PO) Take 1 capsule by mouth 2 (two) times daily.    . pantoprazole (PROTONIX) 40 MG tablet Take 40 mg by mouth daily.     . pravastatin (PRAVACHOL) 20 MG tablet Take 1 tablet (20 mg total) by mouth every evening. 90 tablet 3   No current facility-administered medications on file prior to visit.     Allergies    Allergen Reactions  . Meperidine Hcl Other (See Comments)    "knocked pt out for 4 days"  . Penicillins Anaphylaxis, Shortness Of Breath and Other (See Comments)    Difficulty breathing  . Morphine Other (See Comments)    Stopped breathing due to too high of a dose per patient.  Not an allergic reaction to morphine  . Oxycodone-Acetaminophen Itching       . Propoxyphene N-Acetaminophen Nausea And Vomiting  . Tramadol Hcl Nausea And Vomiting     Assessment/Plan:  1. Hypertension - BP improved and at goal <140/64mmHg despite pt decreasing her diltiazem dose back  to 240mg  daily. She complains of generalized weakness which may be exacerbated by drop in Hgb from 12.3 to 10.5 over the past few months. She is seeing her oncologist on Monday and will bring this up with him. Extensively discussed patient's kidney function and link between her chronic NSAID use with decreased renal function and higher BP readings, however pt unfortunately cannot decrease her NSAID use due to chronic pain. She will continue to stay hydrated. Since BP is at goal today, will not make any med changes however would consider hydralazine or prn clonidine for isolated elevated BP readings in the future to avoid renal issues. F/u in HTN clinic as needed.  1 hour was spent with patient today.  Madelynne Lasker E. Yeily Link, PharmD, CPP, [place] [address], [place], [place] [address] Phone: [phone]; Fax: [phone] 09/07/2017 3:54 PM

## 2017-09-08 DIAGNOSIS — D649 Anemia, unspecified: Secondary | ICD-10-CM

## 2017-09-08 HISTORY — DX: Anemia, unspecified: D64.9

## 2017-09-11 ENCOUNTER — Inpatient Hospital Stay: Payer: Medicare Other | Attending: Internal Medicine | Admitting: Internal Medicine

## 2017-09-11 ENCOUNTER — Encounter: Payer: Self-pay | Admitting: Internal Medicine

## 2017-09-11 ENCOUNTER — Telehealth: Payer: Self-pay | Admitting: Internal Medicine

## 2017-09-11 VITALS — BP 164/49 | HR 65 | Temp 97.6°F | Resp 16 | Ht 64.0 in | Wt 140.7 lb

## 2017-09-11 DIAGNOSIS — C3492 Malignant neoplasm of unspecified part of left bronchus or lung: Secondary | ICD-10-CM

## 2017-09-11 DIAGNOSIS — R5383 Other fatigue: Secondary | ICD-10-CM | POA: Diagnosis not present

## 2017-09-11 DIAGNOSIS — D649 Anemia, unspecified: Secondary | ICD-10-CM

## 2017-09-11 DIAGNOSIS — I10 Essential (primary) hypertension: Secondary | ICD-10-CM | POA: Diagnosis not present

## 2017-09-11 DIAGNOSIS — C3490 Malignant neoplasm of unspecified part of unspecified bronchus or lung: Secondary | ICD-10-CM

## 2017-09-11 DIAGNOSIS — R0609 Other forms of dyspnea: Secondary | ICD-10-CM

## 2017-09-11 NOTE — Telephone Encounter (Signed)
Appointments scheduled AVS/Calendar printed per 3/4 los

## 2017-09-11 NOTE — Progress Notes (Signed)
    [facility] [phone]   [phone]  OFFICE PROGRESS NOTE  Husain, Karrar, MD [address] [place] [address]  PRINCIPAL DIAGNOSES:  1. 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 The patient presented at that time with a right upper lobe lesion, as well as suspicious ground-glass opacities in the left lung. The tumor was negative for EGFR mutation and negative for ALK gene translocation. Veristrat test Good. EGFR mutation studies were conflicting, initial test was negative, second test was positive for EGFR mutation in exon 18 and the the test from [facility] was negative with positive KRAS mutation in Exon 12. 2. Stage IA malignant melanoma, status post wide excision by Dr. Lupton on Nov 12, 2008.  PRIOR THERAPY:  1) Status post right upper lobectomy with lymph node dissection under the care of Dr. Burney on May 21, 2008.  2) Stereotactic radiotherapy to the left upper lobe lung nodule under the care of Dr. Wentworth expected to be completed on 10/21/2011.  3) stereotactic radiotherapy to the left prevascular lymphadenopathy under the care of Dr. Wentworth completed on 09/26/2013. 4) Tarceva 150 mg by mouth daily started 11/29/2013 discontinued today secondary to intolerance with persistent diarrhea and fatigue.  5) Tarceva 100 mg by mouth daily started on 12/27/2013. Discontinued one week after treatment because of intolerance. 6) status post thermal ablation of the left upper lobe recurrent adenocarcinoma under the care of Dr. Mccullough on 10/01/2014  CURRENT THERAPY: Observation.  Advanced directives: The patient has advanced directive and no change is requested.  INTERVAL HISTORY: [name] 77 y.o. female returns to the clinic today for follow-up visit.  The patient is feeling fine today with no  specific complaints except for fatigue and shortness of breath with exertion.  She denied having any chest pain, cough or hemoptysis.  She denied having any recent weight loss or night sweats.  She has no nausea, vomiting, diarrhea or constipation.  She denied having any bleeding, bruises or ecchymosis.  She is scheduled for CT scan of the heart by her cardiologist to rule out any coronary artery disease in the next few weeks.  She had repeat CT scan of the chest performed recently and she is here for evaluation and discussion of her scan results.  MEDICAL HISTORY: Past Medical History:  Diagnosis Date  . Arthritis    back.and feet  . Cervical ca (HCC) dx'd 1988   surg only  . CKD (chronic kidney disease), stage III (HCC)   . Complication of anesthesia    difficulty awakening, dysrhythmia post surgery, (prolonged sedation level)  . Coronary artery calcification seen on CT scan   . Headache    occ. sinus type headaches  . Hx of radiation therapy 09/16/13-09/30/13   prevascular lymph node  . Hyperlipidemia   . Hypertension   . Hyperthyroidism    dr Ellison- radioactive iodine treatment 2011- resolved  . Hypothyroidism   . LBBB (left bundle branch block)   . Lung cancer (HCC)    Bilaterally, RUL lobectomy and spot involving both lungs- "cancer"  . Macular degeneration    bilateral  . Melanoma (HCC)    left leg  . Radiation 10/11/11-10/21/11   Left upper lobe adenocarcinoma  . Tremor    hand- intention tremor    ALLERGIES:  is allergic to meperidine hcl; penicillins; morphine; oxycodone-acetaminophen; propoxyphene n-acetaminophen; and tramadol   hcl.  MEDICATIONS:  Current Outpatient Medications  Medication Sig Dispense Refill  . acetaminophen (TYLENOL) 650 MG CR tablet Take 1,300 mg by mouth 2 (two) times daily.     Marland Kitchen aspirin EC 81 MG tablet Take 1 tablet (81 mg total) by mouth daily. 90 tablet 3  . atenolol (TENORMIN) 25 MG tablet TAKE 1 TABLET(25 MG) BY MOUTH TWICE DAILY 180 tablet 3   . Calcium Carbonate-Vitamin D3 (CALCIUM 600-D) 600-400 MG-UNIT TABS Take 2 tablets by mouth daily.     Marland Kitchen diltiazem (CARDIZEM CD) 240 MG 24 hr capsule Take 240 mg by mouth daily.    Marland Kitchen etodolac (LODINE) 400 MG tablet Take 400 mg by mouth 2 (two) times daily.    . fluticasone (FLONASE) 50 MCG/ACT nasal spray Place 1 spray into both nostrils daily as needed for allergies.     Marland Kitchen gabapentin (NEURONTIN) 300 MG capsule Take 300 mg by mouth 2 (two) times daily.   2  . hydrochlorothiazide (HYDRODIURIL) 25 MG tablet Take 25 mg by mouth daily.    Marland Kitchen levothyroxine (SYNTHROID, LEVOTHROID) 100 MCG tablet TAKE 1 TABLET(100 MCG) BY MOUTH DAILY BEFORE BREAKFAST 30 tablet 0  . Multiple Vitamin (MULTIVITAMIN WITH MINERALS) TABS tablet Take 1 tablet by mouth daily.    . Multiple Vitamins-Minerals (PRESERVISION AREDS 2 PO) Take 1 capsule by mouth 2 (two) times daily.    . pantoprazole (PROTONIX) 40 MG tablet Take 40 mg by mouth daily.     . pravastatin (PRAVACHOL) 20 MG tablet Take 1 tablet (20 mg total) by mouth every evening. 30 tablet 11   No current facility-administered medications for this visit.     SURGICAL HISTORY:  Past Surgical History:  Procedure Laterality Date  . basal cell and squamous cell skin cell area removed     new left lower eye lid done with skin graft   . CATARACT EXTRACTION, BILATERAL Bilateral   . COLONOSCOPY WITH PROPOFOL N/A 10/27/2016   Procedure: COLONOSCOPY WITH PROPOFOL;  Surgeon: Laurence Spates, MD;  Location: [facility];  Service: Endoscopy;  Laterality: N/A;  . conization for cervical dysplasia    . esophogus stretching    . IR GENERIC HISTORICAL  09/11/2014   IR RADIOLOGIST EVAL & MGMT 09/11/2014 Jacqulynn Cadet, MD [facility] INTERV RAD  . radio frequency ablation on lung spot    . rul lobectomy  2010  . TONSILLECTOMY      REVIEW OF SYSTEMS:  A comprehensive review of systems was negative except for: Constitutional: positive for fatigue Respiratory: positive for dyspnea on  exertion   PHYSICAL EXAMINATION: General appearance: alert, cooperative, fatigued and no distress Head: Normocephalic, without obvious abnormality, atraumatic Neck: no adenopathy Lymph nodes: Cervical, supraclavicular, and axillary nodes normal. Resp: clear to auscultation bilaterally Back: symmetric, no curvature. ROM normal. No CVA tenderness. Cardio: regular rate and rhythm, S1, S2 normal, no murmur, click, rub or gallop GI: soft, non-tender; bowel sounds normal; no masses,  no organomegaly Extremities: extremities normal, atraumatic, no cyanosis or edema  ECOG PERFORMANCE STATUS: 1 - Symptomatic but completely ambulatory  Blood pressure (!) 164/49, pulse 65, temperature 97.6 F (36.4 C), temperature source Oral, resp. rate 16, height 5' 4" (1.626 m), weight 140 lb 11.2 oz (63.8 kg), SpO2 98 %.  LABORATORY DATA: Lab Results  Component Value Date   WBC 13.5 (H) 09/04/2017   HGB 10.5 (L) 09/04/2017   HCT 34.5 (L) 09/04/2017   MCV 87.1 09/04/2017   PLT 306 09/04/2017  Chemistry      Component Value Date/Time   NA 141 09/04/2017 1053   NA 140 08/22/2017 1221   NA 141 05/29/2017 1136   K 4.0 09/04/2017 1053   K 4.5 05/29/2017 1136   CL 104 09/04/2017 1053   CL 105 07/17/2012 1434   CO2 29 09/04/2017 1053   CO2 28 05/29/2017 1136   BUN 24 09/04/2017 1053   BUN 27 08/22/2017 1221   BUN 26.1 (H) 05/29/2017 1136   CREATININE 1.28 (H) 09/04/2017 1053   CREATININE 1.3 (H) 05/29/2017 1136      Component Value Date/Time   CALCIUM 10.3 09/04/2017 1053   CALCIUM 10.4 05/29/2017 1136   ALKPHOS 79 09/04/2017 1053   ALKPHOS 59 05/29/2017 1136   AST 13 09/04/2017 1053   AST 15 05/29/2017 1136   ALT 11 09/04/2017 1053   ALT 12 05/29/2017 1136   BILITOT 0.5 09/04/2017 1053   BILITOT 0.5 08/22/2017 1221   BILITOT 0.51 05/29/2017 1136       RADIOGRAPHIC STUDIES: Ct Chest W Contrast  Result Date: 09/04/2017 CLINICAL DATA:  Followup bilateral lung carcinoma. Shortness of  breath for 2 months EXAM: CT CHEST WITH CONTRAST TECHNIQUE: Multidetector CT imaging of the chest was performed during intravenous contrast administration. CONTRAST:  28m ISOVUE-300 IOPAMIDOL (ISOVUE-300) INJECTION 61% COMPARISON:  05/29/2017 FINDINGS: Cardiovascular: No acute findings. Aortic and coronary artery atherosclerosis. Mediastinum/Nodes: No masses or pathologically enlarged lymph nodes identified. Lungs/Pleura: Irregular mass in the central upper lobe measures 3.7 x 3.7 cm on image 46/5, without significant change to study. Pulmonary nodule in the superior segment of the left lower lobe measures 2.1 x 1.3 cm on image 43/5, also without significant change compared to prior study. 6 mm pulmonary nodule in medial left lower lobe on image 110/5, 9 mm anterior left lower lobe pulmonary nodule on image 84/5, and 1.5 cm nodule in the lingula on image 53/5, all remain stable. Peripheral atelectasis or scarring in the medial left upper lobe without significant change. 2.8 x 1.6 cm sub-solid nodule with small anterior solid component is seen in the anterior left lower lobe, without significant change compared to 2.9 x 1.7 cm previously. A 1.5 cm pulmonary nodule in the lingula on image 53/5 is also stable. No new or enlarging pulmonary nodules or masses identified. No evidence of pleural effusion. Upper Abdomen: No significant change in size of small to moderate hiatal hernia. Bilateral renal cysts are stable. Musculoskeletal:  No suspicious bone lesions. IMPRESSION: No significant change in central left upper lobe mass and bilateral solid pulmonary nodules. No new or progressive disease identified. Stable bilateral sub-solid pulmonary nodules. Continued attention recommended on follow-up CT. Stable small to moderate hiatal hernia. Aortic Atherosclerosis (ICD10-I70.0) and Emphysema (ICD10-J43.9). Coronary artery calcification. Electronically Signed   By: JEarle GellM.D.   On: 09/04/2017 14:25   ASSESSMENT AND  PLAN:  This is a very pleasant 77years old white female with recurrent non-small cell lung cancer, adenocarcinoma with questionable EGFR mutation. The patient was treated in the past with Tarceva for a few weeks but this was discontinued secondary to internal returns and significant fatigue and the patient refused to resume any further treatment at that time. She underwent several local treatment including stereotactic radiotherapy as well as radiofrequency ablation to the current tumor in the left upper lobe. The patient is current on observation and she is feeling fine except for mild fatigue and shortness of breath.  She is scheduled for cardiac workup in  the next few weeks. Repeat CT scan of the chest showed no concerning findings for disease progression. I discussed the scan results with the patient and recommended for her to continue on observation with repeat CT scan of the chest in 4 months. For the anemia, I advised the patient to start taking oral iron tablets at regular basis. For hypertension, she was advised to monitor her blood pressure closely at home and to reconsult with her primary care physician for adjustment of her medication. All questions were answered. The patient knows to call the clinic with any problems, questions or concerns. We can certainly see the patient much sooner if necessary. I spent 10 minutes counseling the patient face to face. The total time spent in the appointment was 15 minutes.  Disclaimer: This note was dictated with voice recognition software. Similar sounding words can inadvertently be transcribed and may not be corrected upon review.       

## 2017-09-13 ENCOUNTER — Other Ambulatory Visit: Payer: Self-pay | Admitting: Cardiology

## 2017-09-13 DIAGNOSIS — E039 Hypothyroidism, unspecified: Secondary | ICD-10-CM

## 2017-09-18 ENCOUNTER — Ambulatory Visit (HOSPITAL_COMMUNITY)
Admission: RE | Admit: 2017-09-18 | Discharge: 2017-09-18 | Disposition: A | Discharge status: Home / Self Care | Payer: Medicare Other | Source: Ambulatory Visit | Attending: Cardiology | Admitting: Cardiology

## 2017-09-18 DIAGNOSIS — I10 Essential (primary) hypertension: Secondary | ICD-10-CM

## 2017-09-18 DIAGNOSIS — R0609 Other forms of dyspnea: Secondary | ICD-10-CM | POA: Insufficient documentation

## 2017-09-18 DIAGNOSIS — C44712 Basal cell carcinoma of skin of right lower limb, including hip: Secondary | ICD-10-CM | POA: Diagnosis not present

## 2017-09-18 DIAGNOSIS — I2510 Atherosclerotic heart disease of native coronary artery without angina pectoris: Secondary | ICD-10-CM | POA: Insufficient documentation

## 2017-09-18 DIAGNOSIS — R911 Solitary pulmonary nodule: Secondary | ICD-10-CM

## 2017-09-18 DIAGNOSIS — L820 Inflamed seborrheic keratosis: Secondary | ICD-10-CM | POA: Diagnosis not present

## 2017-09-18 DIAGNOSIS — B079 Viral wart, unspecified: Secondary | ICD-10-CM | POA: Diagnosis not present

## 2017-09-18 DIAGNOSIS — K449 Diaphragmatic hernia without obstruction or gangrene: Secondary | ICD-10-CM | POA: Insufficient documentation

## 2017-09-18 DIAGNOSIS — Z85820 Personal history of malignant melanoma of skin: Secondary | ICD-10-CM | POA: Diagnosis not present

## 2017-09-18 DIAGNOSIS — I447 Left bundle-branch block, unspecified: Secondary | ICD-10-CM

## 2017-09-18 DIAGNOSIS — L814 Other melanin hyperpigmentation: Secondary | ICD-10-CM | POA: Diagnosis not present

## 2017-09-18 DIAGNOSIS — E785 Hyperlipidemia, unspecified: Secondary | ICD-10-CM | POA: Insufficient documentation

## 2017-09-18 DIAGNOSIS — I700 Atherosclerosis of aorta: Secondary | ICD-10-CM | POA: Insufficient documentation

## 2017-09-18 DIAGNOSIS — L570 Actinic keratosis: Secondary | ICD-10-CM | POA: Diagnosis not present

## 2017-09-18 DIAGNOSIS — R918 Other nonspecific abnormal finding of lung field: Secondary | ICD-10-CM

## 2017-09-18 DIAGNOSIS — D225 Melanocytic nevi of trunk: Secondary | ICD-10-CM | POA: Diagnosis not present

## 2017-09-18 DIAGNOSIS — C44529 Squamous cell carcinoma of skin of other part of trunk: Secondary | ICD-10-CM | POA: Diagnosis not present

## 2017-09-18 DIAGNOSIS — C44311 Basal cell carcinoma of skin of nose: Secondary | ICD-10-CM | POA: Diagnosis not present

## 2017-09-18 MED ORDER — METOPROLOL TARTRATE 5 MG/5ML IV SOLN
INTRAVENOUS | Status: AC
  Filled 2017-09-18: qty 5

## 2017-09-18 MED ORDER — METOPROLOL TARTRATE 5 MG/5ML IV SOLN
INTRAVENOUS | Status: AC
  Filled 2017-09-18: qty 10

## 2017-09-18 MED ORDER — METOPROLOL TARTRATE 5 MG/5ML IV SOLN
5.0000 mg | Freq: Once | INTRAVENOUS | Status: AC
  Administered 2017-09-18: 5 mg via INTRAVENOUS

## 2017-09-18 MED ORDER — IOPAMIDOL (ISOVUE-370) INJECTION 76%
INTRAVENOUS | Status: AC
  Filled 2017-09-18: qty 100

## 2017-09-18 MED ORDER — METOPROLOL TARTRATE 5 MG/5ML IV SOLN
5.0000 mg | INTRAVENOUS | Status: DC | PRN
  Administered 2017-09-18: 5 mg via INTRAVENOUS

## 2017-09-18 MED ORDER — NITROGLYCERIN 0.4 MG SL SUBL
SUBLINGUAL_TABLET | SUBLINGUAL | Status: AC
  Filled 2017-09-18: qty 2

## 2017-09-18 MED ORDER — IOPAMIDOL (ISOVUE-370) INJECTION 76%
80.0000 mL | Freq: Once | INTRAVENOUS | Status: AC | PRN
  Administered 2017-09-18: 80 mL via INTRAVENOUS

## 2017-09-18 MED ORDER — NITROGLYCERIN 0.4 MG SL SUBL
0.8000 mg | SUBLINGUAL_TABLET | SUBLINGUAL | Status: DC | PRN
  Administered 2017-09-18: 0.8 mg via SUBLINGUAL

## 2017-09-19 NOTE — H&P (View-Only) (Signed)
Cardiology Office Note    Date:  09/20/2017   ID:  [name], DOB [birth year], MRN [id number]  PCP:  Jessica Low, MD  Cardiologist:  Dr. Meda [name]   Chief Complaint: abnormal CT coronary   History of Present Illness:   [name] is a 77 y.o. female with hx of LBBB, HTN, probable CKD stage III, carotid ultrasound 2014 showing 1-39% stenosis, hypothyroidism, LBBB, GERD, nonsmall cell lung cancer (s/p RULectomy 2009, radiation to LUL nodule 2013, radiation to lymphadenopathy 2015, Tarceva rx, thermal ablation of recurrent LUL adenocarcinoma 2016), arthritis, cervical CA 1988 s/p surgery presents for follow up.   Previously followed by Dr. Ron [name] - underwent cardiac catheterization in the 80s that showed no significant coronary artery disease. Carotid US 2014 showed 1-39% stenosis.  She underwent a nuclear stress test in 2018 that showed no ischemia, scar in the septal wall,  h/o infarct, not sure if it is real or artifact. Echocardiogram showed LVEF is approximately 55% with septal hypokinesis.   Last seen by Dr. Meda [name] 08/22/17.Ppatient has been under significant stress as her husband passed away from kidney cancer with brain mets in July 2018. Her BP has been elevated ever since. Noted dyspnea with known coronary calcium on CT. Recommended CT coronary with FFR which is abnormal as below and recommended cath.   CT coronary: 1. Left Main:  No significant stenosis. 2. LAD: Proximal CT FFR: 0.92, mid 0.74, distal: 0.64. 3. LCX: Proximal: 0.94, mid 0.79, distal: 0.70. 4. RCA: Proximal 0.96, mid 0.67.  IMPRESSION: 1. CT FFR showed severe stenosis in the proximal to mid RCA, mid LAD and mid LCX arteries. A cardiac catheterization is recommended.  Here today for discussion. She continues to have DOE that relieves with rest. She has worse episode this morning while taking shower. Resolved with rest. She DOE progressively getting worse. Her Evening BP improving since she started taking  Cardizem CD in evening, however still elevated intermittently. BP still fluctuates between 130-170s. Recently seen by oncologist and started on Iron for anemia.   Past Medical History:  Diagnosis Date  . Arthritis    back.and feet  . Cervical ca ([place]) dx'd 1988   surg only  . CKD (chronic kidney disease), stage III ([place])   . Complication of anesthesia    difficulty awakening, dysrhythmia post surgery, (prolonged sedation level)  . Coronary artery calcification seen on CT scan   . Headache    occ. sinus type headaches  . Hx of radiation therapy 09/16/13-09/30/13   prevascular lymph node  . Hyperlipidemia   . Hypertension   . Hyperthyroidism    dr [name]- radioactive iodine treatment 2011- resolved  . Hypothyroidism   . LBBB (left bundle branch block)   . Lung cancer ([place])    Bilaterally, RUL lobectomy and spot involving both lungs- "cancer"  . Macular degeneration    bilateral  . Melanoma ([place])    left leg  . Radiation 10/11/11-10/21/11   Left upper lobe adenocarcinoma  . Tremor    hand- intention tremor    Past Surgical History:  Procedure Laterality Date  . basal cell and squamous cell skin cell area removed     new left lower eye lid done with skin graft   . CATARACT EXTRACTION, BILATERAL Bilateral   . COLONOSCOPY WITH PROPOFOL [name]/A 10/27/2016   Procedure: COLONOSCOPY WITH PROPOFOL;  Surgeon: Jessica Spates, MD;  Location: [facility];  Service: Endoscopy;  Laterality: [name]/A;  . conization for cervical dysplasia    .  esophogus stretching    . IR GENERIC HISTORICAL  09/11/2014   IR RADIOLOGIST EVAL & MGMT 09/11/2014 Jessica Cadet, MD [facility] INTERV RAD  . radio frequency ablation on lung spot    . rul lobectomy  2010  . TONSILLECTOMY      Current Medications: Prior to Admission medications   Medication Sig Start Date End Date Taking? Authorizing Provider  acetaminophen (TYLENOL) 650 MG CR tablet Take 1,300 mg by mouth 2 (two) times daily.    Yes [provider]    aspirin EC 81 MG tablet Take 1 tablet (81 mg total) by mouth daily. 03/25/16  Yes Dunn, Dayna N, PA-C  atenolol (TENORMIN) 25 MG tablet TAKE 1 TABLET(25 MG) BY MOUTH TWICE DAILY 10/12/16  Yes Jessica Spark, MD  Calcium Carbonate-Vitamin D3 (CALCIUM 600-D) 600-400 MG-UNIT TABS Take 2 tablets by mouth daily.    Yes [provider]  diltiazem (CARDIZEM CD) 240 MG 24 hr capsule Take 240 mg by mouth daily.   Yes [provider]  etodolac (LODINE) 400 MG tablet Take 400 mg by mouth 2 (two) times daily.   Yes [provider]  Ferrous Sulfate (IRON SUPPLEMENT PO) Take 65 mg by mouth daily.   Yes [provider]  fluticasone (FLONASE) 50 MCG/ACT nasal spray Place 1 spray into both nostrils daily as needed for allergies.    Yes [provider]  gabapentin (NEURONTIN) 300 MG capsule Take 300 mg by mouth 2 (two) times daily.  07/31/14  Yes [provider]  hydrochlorothiazide (HYDRODIURIL) 25 MG tablet Take 25 mg by mouth daily.   Yes [provider]  levothyroxine (SYNTHROID, LEVOTHROID) 100 MCG tablet TAKE 1 TABLET(100 MCG) BY MOUTH DAILY BEFORE BREAKFAST 09/13/17  Yes Jessica Spark, MD  Multiple Vitamin (MULTIVITAMIN WITH MINERALS) TABS tablet Take 1 tablet by mouth daily.   Yes [provider]  Multiple Vitamins-Minerals (PRESERVISION AREDS 2 PO) Take 1 capsule by mouth 2 (two) times daily.   Yes [provider]  pantoprazole (PROTONIX) 40 MG tablet Take 40 mg by mouth daily.  07/12/16  Yes [provider]  pravastatin (PRAVACHOL) 20 MG tablet Take 1 tablet (20 mg total) by mouth every evening. 09/07/17 12/06/17 Yes Jessica Spark, MD    Allergies:   Bee venom; Meperidine hcl; Penicillins; Morphine; Oxycodone-acetaminophen; Propoxyphene [name]-acetaminophen; and Tramadol hcl   Social History   Socioeconomic History  . Marital status: Widowed    Spouse name: None  . Number of children: 2  . Years of education:  None  . Highest education level: None  Social Needs  . Financial resource strain: None  . Food insecurity - worry: None  . Food insecurity - inability: None  . Transportation needs - medical: None  . Transportation needs - non-medical: None  Occupational History  . Occupation: homemaker    Employer: RETIRED  Tobacco Use  . Smoking status: Former Smoker    Packs/day: 1.00    Years: 17.00    Pack years: 17.00    Types: Cigarettes    Last attempt to quit: 09/28/1975    Years since quitting: 42.0  . Smokeless tobacco: Never Used  . Tobacco comment: 33 yrs ago  Substance and Sexual Activity  . Alcohol use: Yes    Comment: glass wine daily  . Drug use: No  . Sexual activity: None  Other Topics Concern  . None  Social History Personal assistant   Married [phone]   4  grandchildren and 2 step-grandchildren   Marriage in good health            Physician Roster:   Oncologist- Dr Julien Nordmann   Surgeon- Dr Lindell Spar- Dr Mable Fill- Dr Allyson Sabal     Family History:  The patient's family history includes Asthma in her mother; Cancer in her maternal grandfather and maternal uncle; Heart disease in her mother; Thyroid disease in her daughter.   ROS:   Please see the history of present illness.    ROS All other systems reviewed and are negative.   PHYSICAL EXAM:   VS:  BP (!) 158/78   Pulse 65   Ht 5' 3.5" (1.613 m)   Wt 140 lb 8 oz (63.7 kg)   SpO2 98%   BMI 24.50 kg/m    GEN: Well nourished, well developed, in no acute distress  HEENT: normal  Neck: no JVD, carotid bruits, or masses Cardiac: RRR; no murmurs, rubs, or gallops,no edema  Respiratory:  clear to auscultation bilaterally, normal work of breathing GI: soft, nontender, nondistended, + BS MS: no deformity or atrophy  Skin: warm and dry, no rash Neuro:  Alert and Oriented x 3, Strength and sensation are intact Psych: euthymic mood, full affect  Wt Readings from Last 3 Encounters:  09/20/17 140 lb 8 oz  (63.7 kg)  09/11/17 140 lb 11.2 oz (63.8 kg)  08/22/17 140 lb (63.5 kg)      Studies/Labs Reviewed:   EKG:  EKG is ordered today.  The ekg ordered today demonstrates NSR at rate of 69 bpm, LBBB - same as prior   Recent Labs: 09/04/2017: ALT 11; BUN 24; Creatinine, Ser 1.28; Hemoglobin 10.5; Platelets 306; Potassium 4.0; Sodium 141   Lipid Panel    Component Value Date/Time   CHOL 193 08/22/2017 1221   TRIG 135 08/22/2017 1221   HDL 53 08/22/2017 1221   CHOLHDL 3.6 08/22/2017 1221   CHOLHDL 4.5 04/12/2016 0841   VLDL 39 (H) 04/12/2016 0841   LDLCALC 113 (H) 08/22/2017 1221   LDLDIRECT 137.6 05/17/2013 0937    Additional studies/ records that were reviewed today include:   As above  ASSESSMENT & PLAN:    1. Abnormal CT coronary/DOE - CT FFR showed severe stenosis in the proximal to mid RCA, mid LAD and mid LCX arteries. A cardiac catheterization is recommended. DOE progressively getting worse. Worse episode this morning.  The patient understands that risks include but are not limited to stroke (1 in 1000), death (1 in 38), kidney failure [usually temporary] (1 in 500), bleeding (1 in 200), allergic reaction [possibly serious] (1 in 200), and agrees to proceed.  - Continue ASA, statin and BB.   2. HTN - Elevated today. Still fluctuates.  No change made today.   3. HLD -08/22/2017: Cholesterol, Total 193; HDL 53; LDL Calculated 113; Triglycerides 135  - Continue Pravastatin 20mg . Recently increased. Consider repeat lab during follow and and adjust dose/change to more stronger medication (she declined today).   4. CKD stage III - check stat BMET  5. Anemia - recently started on Iron by oncologist  6.  Nonsmall cell lung cancer (s/p RULectomy 2009, radiation to LUL nodule 2013, radiation to lymphadenopathy 2015, Tarceva rx, thermal ablation of recurrent LUL adenocarcinoma 2016), - Followed by Dr. Julien Nordmann  Medication Adjustments/Labs and Tests Ordered: Current  medicines are reviewed at length with the patient today.  Concerns regarding medicines are outlined above.  Medication changes, Labs and Tests  ordered today are listed in the Patient Instructions below. Patient Instructions  Medication Instructions:  Your physician has recommended you make the following change in your medication  1. Nitrostat (0.4 mg) Place one tablet under the tongue every 5 minutes as needed for chest pain up to 3 doses. If no relief after the second dose, seek medical attention or call 911.  Labwork: Your physician recommends that you have the following lab work today:  STAT CBC, BMET, PT/INR    Testing/Procedures: Your physician has requested that you have a cardiac catheterization. Cardiac catheterization is used to diagnose and/or treat various heart conditions. Doctors may recommend this procedure for a number of different reasons. The most common reason is to evaluate chest pain. Chest pain can be a symptom of coronary artery disease (CAD), and cardiac catheterization can show whether plaque is narrowing or blocking your heart's arteries. This procedure is also used to evaluate the valves, as well as measure the blood flow and oxygen levels in different parts of your heart. For further information please visit [email or web address]. Please follow instruction sheet, as given.   Follow-Up: The hospital will schedule a follow your follow up appointment with [place] after your procedure.  Any Other Special Instructions Will Be Listed Below (If Applicable).  If you need a refill on your cardiac medications before your next appointment, please call your pharmacy.     [facility] OFF[facility] [address] [place] [address] [place] [address] Dept: [phone] Loc: [place]  09/20/2017  You are scheduled for a Cardiac Catheterization on Thursday, March 14 with Dr. Daneen Schick.  1.  Please arrive at the [facility] (Main Entrance A) at [facility]: [address] [place], [place] [address] at 5:30 AM (two hours before your procedure to ensure your preparation). Free valet parking service is available.   Special note: Every effort is made to have your procedure done on time. Please understand that emergencies sometimes delay scheduled procedures.  2. Diet: Do not eat or drink anything after midnight prior to your procedure except sips of water to take medications.  3. Labs: You will need to have blood drawn on Wednesday, March 13 at [facility] at [facility]. [name] [place]  Open: 7:30am - 5pm    Phone: [phone]. You do not need to be fasting.  4. Medication instructions in preparation for your procedure:  On the morning of your procedure, take your Aspirin 81 mg and any morning medicines NOT listed above.  You may use small sips of water.  5. Plan for one night stay--bring personal belongings. 6. Bring a current list of your medications and current insurance cards. 7. You MUST have a responsible person to drive you home. 8. Someone MUST be with you the first 24 hours after you arrive home or your discharge will be delayed. 9. Please wear clothes that are easy to get on and off and wear slip-on shoes.  Thank you for allowing [place] to care for you!   -- [facility] Invasive Cardiovascular services      Signed, Leanor Kail, [place]  09/20/2017 9:45 AM    [address], [place], [place]  [address] Phone: [phone]; Fax: [phone]

## 2017-09-19 NOTE — Progress Notes (Signed)
Cardiology Office Note    Date:  09/20/2017   ID:  ANANDI ABRAMO, DOB [birth year], MRN [id number]  PCP:  Wenda Low, MD  Cardiologist:  Dr. Meda Coffee   Chief Complaint: abnormal CT coronary   History of Present Illness:   [name] is a 77 y.o. female with hx of LBBB, HTN, probable CKD stage III, carotid ultrasound 2014 showing 1-39% stenosis, hypothyroidism, LBBB, GERD, nonsmall cell lung cancer (s/p RULectomy 2009, radiation to LUL nodule 2013, radiation to lymphadenopathy 2015, Tarceva rx, thermal ablation of recurrent LUL adenocarcinoma 2016), arthritis, cervical CA 1988 s/p surgery presents for follow up.   Previously followed by Dr. Ron Parker - underwent cardiac catheterization in the 80s that showed no significant coronary artery disease. Carotid US 2014 showed 1-39% stenosis.  She underwent a nuclear stress test in 2018 that showed no ischemia, scar in the septal wall,  h/o infarct, not sure if it is real or artifact. Echocardiogram showed LVEF is approximately 55% with septal hypokinesis.   Last seen by Dr. Meda Coffee 08/22/17.Ppatient has been under significant stress as her husband passed away from kidney cancer with brain mets in July 2018. Her BP has been elevated ever since. Noted dyspnea with known coronary calcium on CT. Recommended CT coronary with FFR which is abnormal as below and recommended cath.   CT coronary: 1. Left Main:  No significant stenosis. 2. LAD: Proximal CT FFR: 0.92, mid 0.74, distal: 0.64. 3. LCX: Proximal: 0.94, mid 0.79, distal: 0.70. 4. RCA: Proximal 0.96, mid 0.67.  IMPRESSION: 1. CT FFR showed severe stenosis in the proximal to mid RCA, mid LAD and mid LCX arteries. A cardiac catheterization is recommended.  Here today for discussion. She continues to have DOE that relieves with rest. She has worse episode this morning while taking shower. Resolved with rest. She DOE progressively getting worse. Her Evening BP improving since she started taking  Cardizem CD in evening, however still elevated intermittently. BP still fluctuates between 130-170s. Recently seen by oncologist and started on Iron for anemia.   Past Medical History:  Diagnosis Date  . Arthritis    back.and feet  . Cervical ca ([place]) dx'd 1988   surg only  . CKD (chronic kidney disease), stage III ([place])   . Complication of anesthesia    difficulty awakening, dysrhythmia post surgery, (prolonged sedation level)  . Coronary artery calcification seen on CT scan   . Headache    occ. sinus type headaches  . Hx of radiation therapy 09/16/13-09/30/13   prevascular lymph node  . Hyperlipidemia   . Hypertension   . Hyperthyroidism    dr Loanne Drilling- radioactive iodine treatment 2011- resolved  . Hypothyroidism   . LBBB (left bundle branch block)   . Lung cancer ([place])    Bilaterally, RUL lobectomy and spot involving both lungs- "cancer"  . Macular degeneration    bilateral  . Melanoma ([place])    left leg  . Radiation 10/11/11-10/21/11   Left upper lobe adenocarcinoma  . Tremor    hand- intention tremor    Past Surgical History:  Procedure Laterality Date  . basal cell and squamous cell skin cell area removed     new left lower eye lid done with skin graft   . CATARACT EXTRACTION, BILATERAL Bilateral   . COLONOSCOPY WITH PROPOFOL N/A 10/27/2016   Procedure: COLONOSCOPY WITH PROPOFOL;  Surgeon: Laurence Spates, MD;  Location: [facility];  Service: Endoscopy;  Laterality: N/A;  . conization for cervical dysplasia    .  esophogus stretching    . IR GENERIC HISTORICAL  09/11/2014   IR RADIOLOGIST EVAL & MGMT 09/11/2014 Jacqulynn Cadet, MD [facility] INTERV RAD  . radio frequency ablation on lung spot    . rul lobectomy  2010  . TONSILLECTOMY      Current Medications: Prior to Admission medications   Medication Sig Start Date End Date Taking? Authorizing Provider  acetaminophen (TYLENOL) 650 MG CR tablet Take 1,300 mg by mouth 2 (two) times daily.    Yes [provider]    aspirin EC 81 MG tablet Take 1 tablet (81 mg total) by mouth daily. 03/25/16  Yes Dunn, Dayna N, PA-C  atenolol (TENORMIN) 25 MG tablet TAKE 1 TABLET(25 MG) BY MOUTH TWICE DAILY 10/12/16  Yes Dorothy Spark, MD  Calcium Carbonate-Vitamin D3 (CALCIUM 600-D) 600-400 MG-UNIT TABS Take 2 tablets by mouth daily.    Yes [provider]  diltiazem (CARDIZEM CD) 240 MG 24 hr capsule Take 240 mg by mouth daily.   Yes [provider]  etodolac (LODINE) 400 MG tablet Take 400 mg by mouth 2 (two) times daily.   Yes [provider]  Ferrous Sulfate (IRON SUPPLEMENT PO) Take 65 mg by mouth daily.   Yes [provider]  fluticasone (FLONASE) 50 MCG/ACT nasal spray Place 1 spray into both nostrils daily as needed for allergies.    Yes [provider]  gabapentin (NEURONTIN) 300 MG capsule Take 300 mg by mouth 2 (two) times daily.  07/31/14  Yes [provider]  hydrochlorothiazide (HYDRODIURIL) 25 MG tablet Take 25 mg by mouth daily.   Yes [provider]  levothyroxine (SYNTHROID, LEVOTHROID) 100 MCG tablet TAKE 1 TABLET(100 MCG) BY MOUTH DAILY BEFORE BREAKFAST 09/13/17  Yes Dorothy Spark, MD  Multiple Vitamin (MULTIVITAMIN WITH MINERALS) TABS tablet Take 1 tablet by mouth daily.   Yes [provider]  Multiple Vitamins-Minerals (PRESERVISION AREDS 2 PO) Take 1 capsule by mouth 2 (two) times daily.   Yes [provider]  pantoprazole (PROTONIX) 40 MG tablet Take 40 mg by mouth daily.  07/12/16  Yes [provider]  pravastatin (PRAVACHOL) 20 MG tablet Take 1 tablet (20 mg total) by mouth every evening. 09/07/17 12/06/17 Yes Dorothy Spark, MD    Allergies:   Bee venom; Meperidine hcl; Penicillins; Morphine; Oxycodone-acetaminophen; Propoxyphene n-acetaminophen; and Tramadol hcl   Social History   Socioeconomic History  . Marital status: Widowed    Spouse name: None  . Number of children: 2  . Years of education:  None  . Highest education level: None  Social Needs  . Financial resource strain: None  . Food insecurity - worry: None  . Food insecurity - inability: None  . Transportation needs - medical: None  . Transportation needs - non-medical: None  Occupational History  . Occupation: homemaker    Employer: RETIRED  Tobacco Use  . Smoking status: Former Smoker    Packs/day: 1.00    Years: 17.00    Pack years: 17.00    Types: Cigarettes    Last attempt to quit: 09/28/1975    Years since quitting: 42.0  . Smokeless tobacco: Never Used  . Tobacco comment: 33 yrs ago  Substance and Sexual Activity  . Alcohol use: Yes    Comment: glass wine daily  . Drug use: No  . Sexual activity: None  Other Topics Concern  . None  Social History Personal assistant   Married [phone]   4  grandchildren and 2 step-grandchildren   Marriage in good health            Physician Roster:   Oncologist- Dr Julien Nordmann   Surgeon- Dr Lindell Spar- Dr Mable Fill- Dr Allyson Sabal     Family History:  The patient's family history includes Asthma in her mother; Cancer in her maternal grandfather and maternal uncle; Heart disease in her mother; Thyroid disease in her daughter.   ROS:   Please see the history of present illness.    ROS All other systems reviewed and are negative.   PHYSICAL EXAM:   VS:  BP (!) 158/78   Pulse 65   Ht 5' 3.5" (1.613 m)   Wt 140 lb 8 oz (63.7 kg)   SpO2 98%   BMI 24.50 kg/m    GEN: Well nourished, well developed, in no acute distress  HEENT: normal  Neck: no JVD, carotid bruits, or masses Cardiac: RRR; no murmurs, rubs, or gallops,no edema  Respiratory:  clear to auscultation bilaterally, normal work of breathing GI: soft, nontender, nondistended, + BS MS: no deformity or atrophy  Skin: warm and dry, no rash Neuro:  Alert and Oriented x 3, Strength and sensation are intact Psych: euthymic mood, full affect  Wt Readings from Last 3 Encounters:  09/20/17 140 lb 8 oz  (63.7 kg)  09/11/17 140 lb 11.2 oz (63.8 kg)  08/22/17 140 lb (63.5 kg)      Studies/Labs Reviewed:   EKG:  EKG is ordered today.  The ekg ordered today demonstrates NSR at rate of 69 bpm, LBBB - same as prior   Recent Labs: 09/04/2017: ALT 11; BUN 24; Creatinine, Ser 1.28; Hemoglobin 10.5; Platelets 306; Potassium 4.0; Sodium 141   Lipid Panel    Component Value Date/Time   CHOL 193 08/22/2017 1221   TRIG 135 08/22/2017 1221   HDL 53 08/22/2017 1221   CHOLHDL 3.6 08/22/2017 1221   CHOLHDL 4.5 04/12/2016 0841   VLDL 39 (H) 04/12/2016 0841   LDLCALC 113 (H) 08/22/2017 1221   LDLDIRECT 137.6 05/17/2013 0937    Additional studies/ records that were reviewed today include:   As above  ASSESSMENT & PLAN:    1. Abnormal CT coronary/DOE - CT FFR showed severe stenosis in the proximal to mid RCA, mid LAD and mid LCX arteries. A cardiac catheterization is recommended. DOE progressively getting worse. Worse episode this morning.  The patient understands that risks include but are not limited to stroke (1 in 1000), death (1 in 43), kidney failure [usually temporary] (1 in 500), bleeding (1 in 200), allergic reaction [possibly serious] (1 in 200), and agrees to proceed.  - Continue ASA, statin and BB.   2. HTN - Elevated today. Still fluctuates.  No change made today.   3. HLD -08/22/2017: Cholesterol, Total 193; HDL 53; LDL Calculated 113; Triglycerides 135  - Continue Pravastatin 20mg . Recently increased. Consider repeat lab during follow and and adjust dose/change to more stronger medication (she declined today).   4. CKD stage III - check stat BMET  5. Anemia - recently started on Iron by oncologist  6.  Nonsmall cell lung cancer (s/p RULectomy 2009, radiation to LUL nodule 2013, radiation to lymphadenopathy 2015, Tarceva rx, thermal ablation of recurrent LUL adenocarcinoma 2016), - Followed by Dr. Julien Nordmann  Medication Adjustments/Labs and Tests Ordered: Current  medicines are reviewed at length with the patient today.  Concerns regarding medicines are outlined above.  Medication changes, Labs and Tests  ordered today are listed in the Patient Instructions below. Patient Instructions  Medication Instructions:  Your physician has recommended you make the following change in your medication  1. Nitrostat (0.4 mg) Place one tablet under the tongue every 5 minutes as needed for chest pain up to 3 doses. If no relief after the second dose, seek medical attention or call 911.  Labwork: Your physician recommends that you have the following lab work today:  STAT CBC, BMET, PT/INR    Testing/Procedures: Your physician has requested that you have a cardiac catheterization. Cardiac catheterization is used to diagnose and/or treat various heart conditions. Doctors may recommend this procedure for a number of different reasons. The most common reason is to evaluate chest pain. Chest pain can be a symptom of coronary artery disease (CAD), and cardiac catheterization can show whether plaque is narrowing or blocking your heart's arteries. This procedure is also used to evaluate the valves, as well as measure the blood flow and oxygen levels in different parts of your heart. For further information please visit HugeFiesta.tn. Please follow instruction sheet, as given.   Follow-Up: The hospital will schedule a follow your follow up appointment with [place] after your procedure.  Any Other Special Instructions Will Be Listed Below (If Applicable).  If you need a refill on your cardiac medications before your next appointment, please call your pharmacy.     [place] OFFICE [address] [place] [address] [place] [address] Dept: [phone] Loc: [place]  09/20/2017  You are scheduled for a Cardiac Catheterization on Thursday, March 14 with Dr. Daneen Schick.  1.  Please arrive at the [facility] (Main Entrance A) at [facility]: [address] [place], [place] [address] at 5:30 AM (two hours before your procedure to ensure your preparation). Free valet parking service is available.   Special note: Every effort is made to have your procedure done on time. Please understand that emergencies sometimes delay scheduled procedures.  2. Diet: Do not eat or drink anything after midnight prior to your procedure except sips of water to take medications.  3. Labs: You will need to have blood drawn on Wednesday, March 13 at [facility] at [facility]. [address]  Open: 7:30am - 5pm    Phone: [phone]. You do not need to be fasting.  4. Medication instructions in preparation for your procedure:  On the morning of your procedure, take your Aspirin 81 mg and any morning medicines NOT listed above.  You may use small sips of water.  5. Plan for one night stay--bring personal belongings. 6. Bring a current list of your medications and current insurance cards. 7. You MUST have a responsible person to drive you home. 8. Someone MUST be with you the first 24 hours after you arrive home or your discharge will be delayed. 9. Please wear clothes that are easy to get on and off and wear slip-on shoes.  Thank you for allowing [place] to care for you!   -- [facility] Invasive Cardiovascular services      Signed, [name], [place]  09/20/2017 9:45 AM    [address] [place], [place], [place]  [address] Phone: [phone]; Fax: [phone]

## 2017-09-20 ENCOUNTER — Encounter: Payer: Self-pay | Admitting: Physician Assistant

## 2017-09-20 ENCOUNTER — Ambulatory Visit (INDEPENDENT_AMBULATORY_CARE_PROVIDER_SITE_OTHER): Payer: Medicare Other | Admitting: Physician Assistant

## 2017-09-20 DIAGNOSIS — N183 Chronic kidney disease, stage 3 unspecified: Secondary | ICD-10-CM

## 2017-09-20 DIAGNOSIS — R0989 Other specified symptoms and signs involving the circulatory and respiratory systems: Secondary | ICD-10-CM

## 2017-09-20 DIAGNOSIS — I2583 Coronary atherosclerosis due to lipid rich plaque: Secondary | ICD-10-CM | POA: Diagnosis not present

## 2017-09-20 DIAGNOSIS — I2510 Atherosclerotic heart disease of native coronary artery without angina pectoris: Secondary | ICD-10-CM

## 2017-09-20 DIAGNOSIS — E785 Hyperlipidemia, unspecified: Secondary | ICD-10-CM

## 2017-09-20 DIAGNOSIS — R0609 Other forms of dyspnea: Secondary | ICD-10-CM | POA: Diagnosis not present

## 2017-09-20 LAB — CBC
HEMATOCRIT: 32.3 % — AB (ref 34.0–46.6)
Hemoglobin: 10.4 g/dL — ABNORMAL LOW (ref 11.1–15.9)
MCH: 26.1 pg — ABNORMAL LOW (ref 26.6–33.0)
MCHC: 32.2 g/dL (ref 31.5–35.7)
MCV: 81 fL (ref 79–97)
PLATELETS: 330 10*3/uL (ref 150–379)
RBC: 3.99 x10E6/uL (ref 3.77–5.28)
RDW: 15.3 % (ref 12.3–15.4)
WBC: 10.7 10*3/uL (ref 3.4–10.8)

## 2017-09-20 LAB — BASIC METABOLIC PANEL
BUN / CREAT RATIO: 20 (ref 12–28)
BUN: 25 mg/dL (ref 8–27)
CO2: 31 mmol/L — ABNORMAL HIGH (ref 20–29)
Calcium: 11.5 mg/dL — ABNORMAL HIGH (ref 8.7–10.3)
Chloride: 100 mmol/L (ref 96–106)
Creatinine, Ser: 1.23 mg/dL — ABNORMAL HIGH (ref 0.57–1.00)
GFR calc non Af Amer: 43 mL/min/{1.73_m2} — ABNORMAL LOW (ref >59)
GFR, EST AFRICAN AMERICAN: 49 mL/min/{1.73_m2} — AB (ref >59)
GLUCOSE: 76 mg/dL (ref 65–99)
Potassium: 4.6 mmol/L (ref 3.5–5.2)
SODIUM: 137 mmol/L (ref 134–144)

## 2017-09-20 LAB — PROTIME-INR
INR: 0.9 (ref 0.8–1.2)
Prothrombin Time: 9.8 s (ref 9.1–12.0)

## 2017-09-20 MED ORDER — NITROGLYCERIN 0.4 MG SL SUBL: 0.4000 mg | SUBLINGUAL_TABLET | SUBLINGUAL | 3 refills | Status: DC | PRN

## 2017-09-20 NOTE — Patient Instructions (Signed)
Medication Instructions:  Your physician has recommended you make the following change in your medication  1. Nitrostat (0.4 mg) Place one tablet under the tongue every 5 minutes as needed for chest pain up to 3 doses. If no relief after the second dose, seek medical attention or call 911.  Labwork: Your physician recommends that you have the following lab work today:  STAT CBC, BMET, PT/INR    Testing/Procedures: Your physician has requested that you have a cardiac catheterization. Cardiac catheterization is used to diagnose and/or treat various heart conditions. Doctors may recommend this procedure for a number of different reasons. The most common reason is to evaluate chest pain. Chest pain can be a symptom of coronary artery disease (CAD), and cardiac catheterization can show whether plaque is narrowing or blocking your heart's arteries. This procedure is also used to evaluate the valves, as well as measure the blood flow and oxygen levels in different parts of your heart. For further information please visit HugeFiesta.tn. Please follow instruction sheet, as given.   Follow-Up: The hospital will schedule a follow your follow up appointment with [place] after your procedure.  Any Other Special Instructions Will Be Listed Below (If Applicable).  If you need a refill on your cardiac medications before your next appointment, please call your pharmacy.     [place] OFFICE [address], [place] [address] [place] [address] Dept: [phone] Loc: [place]  09/20/2017  You are scheduled for a Cardiac Catheterization on Thursday, March 14 with Dr. Daneen Schick.  1. Please arrive at the [facility] (Main Entrance A) at [facility]: [address] [place], [place] [address] at 5:30 AM (two hours before your procedure to ensure your preparation). Free valet parking service is  available.   Special note: Every effort is made to have your procedure done on time. Please understand that emergencies sometimes delay scheduled procedures.  2. Diet: Do not eat or drink anything after midnight prior to your procedure except sips of water to take medications.  3. Labs: You will need to have blood drawn on Wednesday, March 13 at [facility] at [facility]. [address]  Open: 7:30am - 5pm    Phone: [phone]. You do not need to be fasting.  4. Medication instructions in preparation for your procedure:  On the morning of your procedure, take your Aspirin 81 mg and any morning medicines NOT listed above.  You may use small sips of water.  5. Plan for one night stay--bring personal belongings. 6. Bring a current list of your medications and current insurance cards. 7. You MUST have a responsible person to drive you home. 8. Someone MUST be with you the first 24 hours after you arrive home or your discharge will be delayed. 9. Please wear clothes that are easy to get on and off and wear slip-on shoes.  Thank you for allowing [place] to care for you!   -- [place] Invasive Cardiovascular services

## 2017-09-20 NOTE — H&P (Signed)
Progressive dyspnea on exertion.  Has history of lung cancer. Low risk myocardial perfusion study 2018. Coronary CT angios with FFR data suggesting significant three-vessel CAD.

## 2017-09-21 ENCOUNTER — Inpatient Hospital Stay (HOSPITAL_COMMUNITY)
Admission: RE | Admit: 2017-09-21 | Discharge: 2017-10-17 | DRG: 246 | Disposition: A | Discharge status: Skilled Nursing Facility | Payer: Medicare Other | Source: Ambulatory Visit | Attending: Interventional Cardiology | Admitting: Interventional Cardiology

## 2017-09-21 ENCOUNTER — Inpatient Hospital Stay (HOSPITAL_COMMUNITY)
Admission: RE | Disposition: A | Discharge status: Skilled Nursing Facility | Payer: Self-pay | Source: Ambulatory Visit | Attending: Interventional Cardiology

## 2017-09-21 ENCOUNTER — Inpatient Hospital Stay (HOSPITAL_COMMUNITY): Payer: Medicare Other

## 2017-09-21 ENCOUNTER — Encounter (HOSPITAL_COMMUNITY): Payer: Self-pay | Admitting: Certified Registered Nurse Anesthetist

## 2017-09-21 ENCOUNTER — Encounter (HOSPITAL_COMMUNITY): Payer: Self-pay | Admitting: Interventional Cardiology

## 2017-09-21 ENCOUNTER — Other Ambulatory Visit: Payer: Self-pay

## 2017-09-21 ENCOUNTER — Ambulatory Visit (HOSPITAL_COMMUNITY): Payer: Medicare Other | Admitting: Certified Registered Nurse Anesthetist

## 2017-09-21 DIAGNOSIS — N183 Chronic kidney disease, stage 3 unspecified: Secondary | ICD-10-CM | POA: Diagnosis present

## 2017-09-21 DIAGNOSIS — N170 Acute kidney failure with tubular necrosis: Secondary | ICD-10-CM | POA: Diagnosis present

## 2017-09-21 DIAGNOSIS — Z79899 Other long term (current) drug therapy: Secondary | ICD-10-CM | POA: Diagnosis not present

## 2017-09-21 DIAGNOSIS — E785 Hyperlipidemia, unspecified: Secondary | ICD-10-CM | POA: Diagnosis not present

## 2017-09-21 DIAGNOSIS — Y831 Surgical operation with implant of artificial internal device as the cause of abnormal reaction of the patient, or of later complication, without mention of misadventure at the time of the procedure: Secondary | ICD-10-CM | POA: Diagnosis not present

## 2017-09-21 DIAGNOSIS — G9341 Metabolic encephalopathy: Secondary | ICD-10-CM | POA: Diagnosis present

## 2017-09-21 DIAGNOSIS — I10 Essential (primary) hypertension: Secondary | ICD-10-CM | POA: Diagnosis not present

## 2017-09-21 DIAGNOSIS — Z85820 Personal history of malignant melanoma of skin: Secondary | ICD-10-CM

## 2017-09-21 DIAGNOSIS — Z0189 Encounter for other specified special examinations: Secondary | ICD-10-CM

## 2017-09-21 DIAGNOSIS — J9601 Acute respiratory failure with hypoxia: Secondary | ICD-10-CM | POA: Diagnosis present

## 2017-09-21 DIAGNOSIS — Z4682 Encounter for fitting and adjustment of non-vascular catheter: Secondary | ICD-10-CM | POA: Diagnosis not present

## 2017-09-21 DIAGNOSIS — I447 Left bundle-branch block, unspecified: Secondary | ICD-10-CM | POA: Diagnosis present

## 2017-09-21 DIAGNOSIS — Z978 Presence of other specified devices: Secondary | ICD-10-CM | POA: Diagnosis not present

## 2017-09-21 DIAGNOSIS — K922 Gastrointestinal hemorrhage, unspecified: Secondary | ICD-10-CM

## 2017-09-21 DIAGNOSIS — J449 Chronic obstructive pulmonary disease, unspecified: Secondary | ICD-10-CM | POA: Diagnosis not present

## 2017-09-21 DIAGNOSIS — Z8541 Personal history of malignant neoplasm of cervix uteri: Secondary | ICD-10-CM

## 2017-09-21 DIAGNOSIS — T82897A Other specified complication of cardiac prosthetic devices, implants and grafts, initial encounter: Secondary | ICD-10-CM | POA: Diagnosis not present

## 2017-09-21 DIAGNOSIS — E7800 Pure hypercholesterolemia, unspecified: Secondary | ICD-10-CM | POA: Diagnosis present

## 2017-09-21 DIAGNOSIS — M6281 Muscle weakness (generalized): Secondary | ICD-10-CM | POA: Diagnosis not present

## 2017-09-21 DIAGNOSIS — K5521 Angiodysplasia of colon with hemorrhage: Secondary | ICD-10-CM | POA: Diagnosis not present

## 2017-09-21 DIAGNOSIS — Y848 Other medical procedures as the cause of abnormal reaction of the patient, or of later complication, without mention of misadventure at the time of the procedure: Secondary | ICD-10-CM | POA: Diagnosis not present

## 2017-09-21 DIAGNOSIS — K219 Gastro-esophageal reflux disease without esophagitis: Secondary | ICD-10-CM | POA: Diagnosis present

## 2017-09-21 DIAGNOSIS — J9690 Respiratory failure, unspecified, unspecified whether with hypoxia or hypercapnia: Secondary | ICD-10-CM

## 2017-09-21 DIAGNOSIS — Z923 Personal history of irradiation: Secondary | ICD-10-CM | POA: Diagnosis not present

## 2017-09-21 DIAGNOSIS — I462 Cardiac arrest due to underlying cardiac condition: Secondary | ICD-10-CM | POA: Diagnosis not present

## 2017-09-21 DIAGNOSIS — K5900 Constipation, unspecified: Secondary | ICD-10-CM | POA: Diagnosis not present

## 2017-09-21 DIAGNOSIS — Z66 Do not resuscitate: Secondary | ICD-10-CM | POA: Diagnosis not present

## 2017-09-21 DIAGNOSIS — I739 Peripheral vascular disease, unspecified: Secondary | ICD-10-CM | POA: Diagnosis not present

## 2017-09-21 DIAGNOSIS — I25119 Atherosclerotic heart disease of native coronary artery with unspecified angina pectoris: Principal | ICD-10-CM | POA: Diagnosis present

## 2017-09-21 DIAGNOSIS — I2510 Atherosclerotic heart disease of native coronary artery without angina pectoris: Secondary | ICD-10-CM | POA: Diagnosis not present

## 2017-09-21 DIAGNOSIS — I4891 Unspecified atrial fibrillation: Secondary | ICD-10-CM | POA: Diagnosis present

## 2017-09-21 DIAGNOSIS — T82867A Thrombosis of cardiac prosthetic devices, implants and grafts, initial encounter: Secondary | ICD-10-CM | POA: Diagnosis not present

## 2017-09-21 DIAGNOSIS — K259 Gastric ulcer, unspecified as acute or chronic, without hemorrhage or perforation: Secondary | ICD-10-CM | POA: Diagnosis not present

## 2017-09-21 DIAGNOSIS — C3490 Malignant neoplasm of unspecified part of unspecified bronchus or lung: Secondary | ICD-10-CM | POA: Diagnosis present

## 2017-09-21 DIAGNOSIS — I129 Hypertensive chronic kidney disease with stage 1 through stage 4 chronic kidney disease, or unspecified chronic kidney disease: Secondary | ICD-10-CM | POA: Diagnosis present

## 2017-09-21 DIAGNOSIS — T82867D Thrombosis of cardiac prosthetic devices, implants and grafts, subsequent encounter: Secondary | ICD-10-CM | POA: Diagnosis not present

## 2017-09-21 DIAGNOSIS — Z902 Acquired absence of lung [part of]: Secondary | ICD-10-CM

## 2017-09-21 DIAGNOSIS — R0600 Dyspnea, unspecified: Secondary | ICD-10-CM

## 2017-09-21 DIAGNOSIS — Z7901 Long term (current) use of anticoagulants: Secondary | ICD-10-CM | POA: Diagnosis not present

## 2017-09-21 DIAGNOSIS — C3412 Malignant neoplasm of upper lobe, left bronchus or lung: Secondary | ICD-10-CM | POA: Diagnosis present

## 2017-09-21 DIAGNOSIS — J9811 Atelectasis: Secondary | ICD-10-CM | POA: Diagnosis not present

## 2017-09-21 DIAGNOSIS — Z7982 Long term (current) use of aspirin: Secondary | ICD-10-CM

## 2017-09-21 DIAGNOSIS — K254 Chronic or unspecified gastric ulcer with hemorrhage: Secondary | ICD-10-CM | POA: Diagnosis not present

## 2017-09-21 DIAGNOSIS — K921 Melena: Secondary | ICD-10-CM | POA: Diagnosis not present

## 2017-09-21 DIAGNOSIS — E039 Hypothyroidism, unspecified: Secondary | ICD-10-CM | POA: Diagnosis present

## 2017-09-21 DIAGNOSIS — J432 Centrilobular emphysema: Secondary | ICD-10-CM | POA: Diagnosis not present

## 2017-09-21 DIAGNOSIS — R2689 Other abnormalities of gait and mobility: Secondary | ICD-10-CM | POA: Diagnosis not present

## 2017-09-21 DIAGNOSIS — Z955 Presence of coronary angioplasty implant and graft: Secondary | ICD-10-CM

## 2017-09-21 DIAGNOSIS — D62 Acute posthemorrhagic anemia: Secondary | ICD-10-CM | POA: Diagnosis not present

## 2017-09-21 DIAGNOSIS — R918 Other nonspecific abnormal finding of lung field: Secondary | ICD-10-CM | POA: Diagnosis not present

## 2017-09-21 DIAGNOSIS — I25110 Atherosclerotic heart disease of native coronary artery with unstable angina pectoris: Secondary | ICD-10-CM | POA: Diagnosis not present

## 2017-09-21 DIAGNOSIS — I4901 Ventricular fibrillation: Secondary | ICD-10-CM | POA: Diagnosis not present

## 2017-09-21 DIAGNOSIS — K449 Diaphragmatic hernia without obstruction or gangrene: Secondary | ICD-10-CM | POA: Diagnosis present

## 2017-09-21 DIAGNOSIS — Z87891 Personal history of nicotine dependence: Secondary | ICD-10-CM

## 2017-09-21 DIAGNOSIS — K222 Esophageal obstruction: Secondary | ICD-10-CM | POA: Diagnosis present

## 2017-09-21 DIAGNOSIS — S225XXA Flail chest, initial encounter for closed fracture: Secondary | ICD-10-CM

## 2017-09-21 DIAGNOSIS — I499 Cardiac arrhythmia, unspecified: Secondary | ICD-10-CM | POA: Diagnosis not present

## 2017-09-21 DIAGNOSIS — K5520 Angiodysplasia of colon without hemorrhage: Secondary | ICD-10-CM | POA: Diagnosis not present

## 2017-09-21 DIAGNOSIS — D649 Anemia, unspecified: Secondary | ICD-10-CM | POA: Diagnosis not present

## 2017-09-21 DIAGNOSIS — C3492 Malignant neoplasm of unspecified part of left bronchus or lung: Secondary | ICD-10-CM | POA: Diagnosis not present

## 2017-09-21 DIAGNOSIS — G4700 Insomnia, unspecified: Secondary | ICD-10-CM | POA: Diagnosis present

## 2017-09-21 DIAGNOSIS — I469 Cardiac arrest, cause unspecified: Secondary | ICD-10-CM | POA: Diagnosis not present

## 2017-09-21 DIAGNOSIS — D500 Iron deficiency anemia secondary to blood loss (chronic): Secondary | ICD-10-CM | POA: Diagnosis not present

## 2017-09-21 DIAGNOSIS — J439 Emphysema, unspecified: Secondary | ICD-10-CM | POA: Diagnosis present

## 2017-09-21 DIAGNOSIS — J9809 Other diseases of bronchus, not elsewhere classified: Secondary | ICD-10-CM | POA: Diagnosis not present

## 2017-09-21 DIAGNOSIS — K31811 Angiodysplasia of stomach and duodenum with bleeding: Secondary | ICD-10-CM | POA: Diagnosis not present

## 2017-09-21 DIAGNOSIS — R278 Other lack of coordination: Secondary | ICD-10-CM | POA: Diagnosis not present

## 2017-09-21 DIAGNOSIS — E782 Mixed hyperlipidemia: Secondary | ICD-10-CM | POA: Diagnosis not present

## 2017-09-21 DIAGNOSIS — E876 Hypokalemia: Secondary | ICD-10-CM | POA: Diagnosis not present

## 2017-09-21 HISTORY — DX: Anemia, unspecified: D64.9

## 2017-09-21 HISTORY — DX: Dyspnea, unspecified: R06.00

## 2017-09-21 HISTORY — PX: CORONARY STENT INTERVENTION: CATH118234

## 2017-09-21 HISTORY — PX: CORONARY/GRAFT ACUTE MI REVASCULARIZATION: CATH118305

## 2017-09-21 HISTORY — DX: Gastro-esophageal reflux disease without esophagitis: K21.9

## 2017-09-21 HISTORY — PX: CARDIAC CATHETERIZATION: SHX172

## 2017-09-21 HISTORY — DX: Personal history of other diseases of the digestive system: Z87.19

## 2017-09-21 HISTORY — PX: INTRAVASCULAR ULTRASOUND/IVUS: CATH118244

## 2017-09-21 HISTORY — PX: LEFT HEART CATH AND CORONARY ANGIOGRAPHY: CATH118249

## 2017-09-21 LAB — CBC
HCT: 34.6 % — ABNORMAL LOW (ref 36.0–46.0)
HEMATOCRIT: 34.1 % — AB (ref 36.0–46.0)
HEMOGLOBIN: 10.4 g/dL — AB (ref 12.0–15.0)
Hemoglobin: 10.3 g/dL — ABNORMAL LOW (ref 12.0–15.0)
MCH: 26 pg (ref 26.0–34.0)
MCH: 26.3 pg (ref 26.0–34.0)
MCHC: 30.2 g/dL (ref 30.0–36.0)
MCHC: 30.1 g/dL (ref 30.0–36.0)
MCV: 86.1 fL (ref 78.0–100.0)
MCV: 87.6 fL (ref 78.0–100.0)
PLATELETS: 293 10*3/uL (ref 150–400)
Platelets: 329 10*3/uL (ref 150–400)
RBC: 3.96 MIL/uL (ref 3.87–5.11)
RBC: 3.95 MIL/uL (ref 3.87–5.11)
RDW: 15.5 % (ref 11.5–15.5)
RDW: 15.9 % — ABNORMAL HIGH (ref 11.5–15.5)
WBC: 12.7 10*3/uL — ABNORMAL HIGH (ref 4.0–10.5)
WBC: 16.8 10*3/uL — AB (ref 4.0–10.5)

## 2017-09-21 LAB — POCT I-STAT 3, ART BLOOD GAS (G3+)
Acid-base deficit: 3 mmol/L — ABNORMAL HIGH (ref 0.0–2.0)
Bicarbonate: 23.8 mmol/L (ref 20.0–28.0)
O2 SAT: 100 %
Patient temperature: 94.2
TCO2: 25 mmol/L (ref 22–32)
pCO2 arterial: 45.9 mmHg (ref 32.0–48.0)
pH, Arterial: 7.31 — ABNORMAL LOW (ref 7.350–7.450)
pO2, Arterial: 397 mmHg — ABNORMAL HIGH (ref 83.0–108.0)

## 2017-09-21 LAB — RENAL FUNCTION PANEL
ALBUMIN: 2.4 g/dL — AB (ref 3.5–5.0)
Anion gap: 8 (ref 5–15)
BUN: 27 mg/dL — ABNORMAL HIGH (ref 6–20)
CO2: 22 mmol/L (ref 22–32)
CREATININE: 1.37 mg/dL — AB (ref 0.44–1.00)
Calcium: 8.1 mg/dL — ABNORMAL LOW (ref 8.9–10.3)
Chloride: 109 mmol/L (ref 101–111)
GFR calc Af Amer: 42 mL/min — ABNORMAL LOW (ref >60)
GFR, EST NON AFRICAN AMERICAN: 36 mL/min — AB (ref >60)
Glucose, Bld: 158 mg/dL — ABNORMAL HIGH (ref 65–99)
PHOSPHORUS: 6 mg/dL — AB (ref 2.5–4.6)
Potassium: 4.4 mmol/L (ref 3.5–5.1)
Sodium: 139 mmol/L (ref 135–145)

## 2017-09-21 LAB — CREATININE, SERUM
Creatinine, Ser: 1.24 mg/dL — ABNORMAL HIGH (ref 0.44–1.00)
Creatinine, Ser: 1.34 mg/dL — ABNORMAL HIGH (ref 0.44–1.00)
GFR calc non Af Amer: 41 mL/min — ABNORMAL LOW (ref >60)
GFR calc non Af Amer: 37 mL/min — ABNORMAL LOW (ref >60)
GFR, EST AFRICAN AMERICAN: 48 mL/min — AB (ref >60)
GFR, EST AFRICAN AMERICAN: 43 mL/min — AB (ref >60)

## 2017-09-21 LAB — POCT ACTIVATED CLOTTING TIME
ACTIVATED CLOTTING TIME: 307 s
Activated Clotting Time: 312 seconds
Activated Clotting Time: 301 seconds

## 2017-09-21 LAB — MAGNESIUM: MAGNESIUM: 2.4 mg/dL (ref 1.7–2.4)

## 2017-09-21 SURGERY — LEFT HEART CATH AND CORONARY ANGIOGRAPHY
Anesthesia: LOCAL

## 2017-09-21 SURGERY — CORONARY/GRAFT ACUTE MI REVASCULARIZATION
Anesthesia: LOCAL

## 2017-09-21 MED ORDER — IOPAMIDOL (ISOVUE-370) INJECTION 76%
INTRAVENOUS | Status: DC | PRN
  Administered 2017-09-21: 170 mL

## 2017-09-21 MED ORDER — SODIUM CHLORIDE 0.9 % WEIGHT BASED INFUSION: 1.7500 mL/kg/h | INTRAVENOUS | Status: DC

## 2017-09-21 MED ORDER — SODIUM CHLORIDE 0.9% FLUSH
3.0000 mL | Freq: Two times a day (BID) | INTRAVENOUS | Status: DC
  Administered 2017-09-22 – 2017-09-25 (×4): 3 mL via INTRAVENOUS

## 2017-09-21 MED ORDER — ONDANSETRON HCL 4 MG/2ML IJ SOLN
4.0000 mg | Freq: Four times a day (QID) | INTRAMUSCULAR | Status: DC | PRN
  Administered 2017-09-21 – 2017-10-10 (×13): 4 mg via INTRAVENOUS
  Filled 2017-09-21 (×15): qty 2

## 2017-09-21 MED ORDER — FENTANYL CITRATE (PF) 100 MCG/2ML IJ SOLN
INTRAMUSCULAR | Status: DC | PRN
  Administered 2017-09-21 (×3): 25 ug via INTRAVENOUS

## 2017-09-21 MED ORDER — CHLORHEXIDINE GLUCONATE 0.12% ORAL RINSE (MEDLINE KIT)
15.0000 mL | Freq: Two times a day (BID) | OROMUCOSAL | Status: DC
  Administered 2017-09-21 – 2017-09-22 (×2): 15 mL via OROMUCOSAL

## 2017-09-21 MED ORDER — NITROGLYCERIN 0.4 MG SL SUBL
0.4000 mg | SUBLINGUAL_TABLET | SUBLINGUAL | Status: DC | PRN
  Filled 2017-09-21: qty 1

## 2017-09-21 MED ORDER — SODIUM CHLORIDE 0.9 % IV SOLN
0.0000 mg/h | INTRAVENOUS | Status: DC
  Filled 2017-09-21: qty 10

## 2017-09-21 MED ORDER — HEPARIN (PORCINE) IN NACL 2-0.9 UNIT/ML-% IJ SOLN
INTRAMUSCULAR | Status: AC
  Filled 2017-09-21: qty 1000

## 2017-09-21 MED ORDER — FENTANYL CITRATE (PF) 100 MCG/2ML IJ SOLN: 50.0000 ug | Freq: Once | INTRAMUSCULAR | Status: DC

## 2017-09-21 MED ORDER — HEPARIN (PORCINE) IN NACL 2-0.9 UNIT/ML-% IJ SOLN
INTRAMUSCULAR | Status: AC | PRN
  Administered 2017-09-21 (×2): 500 mL

## 2017-09-21 MED ORDER — HEPARIN SODIUM (PORCINE) 1000 UNIT/ML IJ SOLN
INTRAMUSCULAR | Status: AC
  Filled 2017-09-21: qty 1

## 2017-09-21 MED ORDER — TIROFIBAN HCL IV 12.5 MG/250 ML
0.0750 ug/kg/min | INTRAVENOUS | Status: DC
  Filled 2017-09-21: qty 250

## 2017-09-21 MED ORDER — BIVALIRUDIN TRIFLUOROACETATE 250 MG IV SOLR
INTRAVENOUS | Status: AC
  Filled 2017-09-21: qty 250

## 2017-09-21 MED ORDER — ADULT MULTIVITAMIN W/MINERALS CH
1.0000 | ORAL_TABLET | Freq: Every day | ORAL | Status: DC
  Administered 2017-09-21 – 2017-10-17 (×24): 1 via ORAL
  Filled 2017-09-21 (×26): qty 1

## 2017-09-21 MED ORDER — TIROFIBAN HCL IN NACL 5-0.9 MG/100ML-% IV SOLN
INTRAVENOUS | Status: AC
  Filled 2017-09-21: qty 100

## 2017-09-21 MED ORDER — CLOPIDOGREL BISULFATE 75 MG PO TABS: 75.0000 mg | ORAL_TABLET | Freq: Every day | ORAL | Status: DC

## 2017-09-21 MED ORDER — ONDANSETRON HCL 4 MG/2ML IJ SOLN: 4.0000 mg | Freq: Four times a day (QID) | INTRAMUSCULAR | Status: DC | PRN

## 2017-09-21 MED ORDER — HEPARIN SODIUM (PORCINE) 5000 UNIT/ML IJ SOLN: 5000.0000 [IU] | Freq: Three times a day (TID) | INTRAMUSCULAR | Status: DC

## 2017-09-21 MED ORDER — ALUM & MAG HYDROXIDE-SIMETH 200-200-20 MG/5ML PO SUSP
30.0000 mL | Freq: Four times a day (QID) | ORAL | Status: DC | PRN
  Administered 2017-09-21 – 2017-09-28 (×3): 30 mL via ORAL
  Filled 2017-09-21 (×3): qty 30

## 2017-09-21 MED ORDER — MIDAZOLAM HCL 2 MG/2ML IJ SOLN
INTRAMUSCULAR | Status: AC
  Filled 2017-09-21: qty 2

## 2017-09-21 MED ORDER — FENTANYL CITRATE (PF) 100 MCG/2ML IJ SOLN
INTRAMUSCULAR | Status: AC
  Filled 2017-09-21: qty 2

## 2017-09-21 MED ORDER — FENTANYL CITRATE (PF) 100 MCG/2ML IJ SOLN
INTRAMUSCULAR | Status: DC | PRN
  Administered 2017-09-21: 50 ug via INTRAVENOUS

## 2017-09-21 MED ORDER — IOPAMIDOL (ISOVUE-370) INJECTION 76%
INTRAVENOUS | Status: AC
  Filled 2017-09-21: qty 100

## 2017-09-21 MED ORDER — SODIUM CHLORIDE 0.9 % IV SOLN: 250.0000 mL | INTRAVENOUS | Status: DC | PRN

## 2017-09-21 MED ORDER — ATENOLOL 50 MG PO TABS
25.0000 mg | ORAL_TABLET | Freq: Every day | ORAL | Status: DC
  Filled 2017-09-21: qty 1

## 2017-09-21 MED ORDER — ASPIRIN 81 MG PO CHEW: 81.0000 mg | CHEWABLE_TABLET | Freq: Every day | ORAL | Status: DC

## 2017-09-21 MED ORDER — ASPIRIN 81 MG PO CHEW
81.0000 mg | CHEWABLE_TABLET | Freq: Every day | ORAL | Status: DC
Start: 2017-09-22 — End: 2017-09-22
  Administered 2017-09-22: 81 mg via NASOGASTRIC
  Filled 2017-09-21: qty 1

## 2017-09-21 MED ORDER — GABAPENTIN 300 MG PO CAPS
600.0000 mg | ORAL_CAPSULE | Freq: Every day | ORAL | Status: DC
  Administered 2017-09-22 – 2017-10-16 (×25): 600 mg via ORAL
  Filled 2017-09-21 (×24): qty 2

## 2017-09-21 MED ORDER — MIDAZOLAM HCL 2 MG/2ML IJ SOLN
INTRAMUSCULAR | Status: DC | PRN
Start: 2017-09-21 — End: 2017-09-21
  Administered 2017-09-21 (×3): 0.5 mg via INTRAVENOUS

## 2017-09-21 MED ORDER — CLOPIDOGREL BISULFATE 75 MG PO TABS
75.0000 mg | ORAL_TABLET | Freq: Every day | ORAL | Status: DC
  Administered 2017-09-22 – 2017-10-10 (×19): 75 mg via ORAL
  Filled 2017-09-21 (×19): qty 1

## 2017-09-21 MED ORDER — SODIUM CHLORIDE 0.9 % WEIGHT BASED INFUSION
3.0000 mL/kg/h | INTRAVENOUS | Status: DC
  Administered 2017-09-21: 3 mL/kg/h via INTRAVENOUS

## 2017-09-21 MED ORDER — SODIUM CHLORIDE 0.9 % IV SOLN
INTRAVENOUS | Status: DC | PRN
  Administered 2017-09-21: 1.75 mg/kg/h via INTRAVENOUS

## 2017-09-21 MED ORDER — SODIUM CHLORIDE 0.9 % WEIGHT BASED INFUSION
1.5000 mL/kg/h | INTRAVENOUS | Status: DC
  Administered 2017-09-21: 1.5 mL/kg/h via INTRAVENOUS

## 2017-09-21 MED ORDER — SODIUM CHLORIDE 0.9 % IV SOLN
0.5000 mg/h | INTRAVENOUS | Status: DC
  Administered 2017-09-21 (×4): 1 mg/h via INTRAVENOUS
  Filled 2017-09-21: qty 10

## 2017-09-21 MED ORDER — CLOPIDOGREL BISULFATE 300 MG PO TABS
ORAL_TABLET | ORAL | Status: AC
  Filled 2017-09-21: qty 1

## 2017-09-21 MED ORDER — BISACODYL 10 MG RE SUPP
10.0000 mg | Freq: Every day | RECTAL | Status: DC | PRN
  Filled 2017-09-21: qty 1

## 2017-09-21 MED ORDER — ASPIRIN 81 MG PO CHEW: 81.0000 mg | CHEWABLE_TABLET | ORAL | Status: DC

## 2017-09-21 MED ORDER — HYDROCHLOROTHIAZIDE 25 MG PO TABS
25.0000 mg | ORAL_TABLET | Freq: Every day | ORAL | Status: DC
  Administered 2017-09-21: 10:00:00 25 mg via ORAL
  Filled 2017-09-21: qty 1

## 2017-09-21 MED ORDER — ATORVASTATIN CALCIUM 80 MG PO TABS
80.0000 mg | ORAL_TABLET | Freq: Every day | ORAL | Status: DC
  Filled 2017-09-21: qty 1

## 2017-09-21 MED ORDER — MORPHINE SULFATE (PF) 2 MG/ML IV SOLN
INTRAVENOUS | Status: AC
  Administered 2017-09-21: 1 mg via INTRAVENOUS
  Filled 2017-09-21: qty 1

## 2017-09-21 MED ORDER — ATORVASTATIN CALCIUM 80 MG PO TABS: 80.0000 mg | ORAL_TABLET | Freq: Every day | ORAL | Status: DC

## 2017-09-21 MED ORDER — HYDRALAZINE HCL 20 MG/ML IJ SOLN
5.0000 mg | INTRAMUSCULAR | Status: AC | PRN
Start: 2017-09-21 — End: 2017-09-21

## 2017-09-21 MED ORDER — DOCUSATE SODIUM 50 MG/5ML PO LIQD: 100.0000 mg | Freq: Two times a day (BID) | ORAL | Status: DC | PRN

## 2017-09-21 MED ORDER — PANTOPRAZOLE SODIUM 40 MG IV SOLR
40.0000 mg | Freq: Every day | INTRAVENOUS | Status: DC
  Administered 2017-09-22 (×2): 40 mg via INTRAVENOUS
  Filled 2017-09-21 (×2): qty 40

## 2017-09-21 MED ORDER — FLUTICASONE PROPIONATE 50 MCG/ACT NA SUSP: 1.0000 | Freq: Every day | NASAL | Status: DC | PRN

## 2017-09-21 MED ORDER — FENTANYL BOLUS VIA INFUSION
25.0000 ug | INTRAVENOUS | Status: DC | PRN
  Filled 2017-09-21: qty 25

## 2017-09-21 MED ORDER — LABETALOL HCL 5 MG/ML IV SOLN
10.0000 mg | INTRAVENOUS | Status: AC | PRN
  Administered 2017-09-21: 14:00:00 10 mg via INTRAVENOUS
  Filled 2017-09-21: qty 4

## 2017-09-21 MED ORDER — LIDOCAINE HCL (PF) 1 % IJ SOLN
INTRAMUSCULAR | Status: DC | PRN
  Administered 2017-09-21: 15 mL via SUBCUTANEOUS

## 2017-09-21 MED ORDER — VERAPAMIL HCL 2.5 MG/ML IV SOLN
INTRAVENOUS | Status: AC
Start: 2017-09-21 — End: ?
  Filled 2017-09-21: qty 2

## 2017-09-21 MED ORDER — BIVALIRUDIN BOLUS VIA INFUSION - CUPID
INTRAVENOUS | Status: DC | PRN
  Administered 2017-09-21: 47.625 mg via INTRAVENOUS

## 2017-09-21 MED ORDER — DILTIAZEM HCL ER COATED BEADS 240 MG PO CP24
240.0000 mg | ORAL_CAPSULE | Freq: Every day | ORAL | Status: DC
  Filled 2017-09-21: qty 1

## 2017-09-21 MED ORDER — MIDAZOLAM HCL 2 MG/2ML IJ SOLN
INTRAMUSCULAR | Status: DC | PRN
  Administered 2017-09-21: 2 mg via INTRAVENOUS
  Administered 2017-09-21 (×2): 1 mg via INTRAVENOUS

## 2017-09-21 MED ORDER — VERAPAMIL HCL 2.5 MG/ML IV SOLN
INTRAVENOUS | Status: DC | PRN
  Administered 2017-09-21: 10 mL via INTRA_ARTERIAL

## 2017-09-21 MED ORDER — CLOPIDOGREL BISULFATE 300 MG PO TABS
ORAL_TABLET | ORAL | Status: DC | PRN
  Administered 2017-09-21: 600 mg via ORAL

## 2017-09-21 MED ORDER — LABETALOL HCL 5 MG/ML IV SOLN: 10.0000 mg | INTRAVENOUS | Status: DC | PRN

## 2017-09-21 MED ORDER — LIDOCAINE HCL 1 % IJ SOLN
INTRAMUSCULAR | Status: AC
  Filled 2017-09-21: qty 20

## 2017-09-21 MED ORDER — HYDRALAZINE HCL 20 MG/ML IJ SOLN: 5.0000 mg | INTRAMUSCULAR | Status: DC | PRN

## 2017-09-21 MED ORDER — HEPARIN SODIUM (PORCINE) 1000 UNIT/ML IJ SOLN
INTRAMUSCULAR | Status: DC | PRN
  Administered 2017-09-21: 7000 [IU] via INTRAVENOUS
  Administered 2017-09-21: 3000 [IU] via INTRAVENOUS
  Administered 2017-09-21: 2000 [IU] via INTRAVENOUS

## 2017-09-21 MED ORDER — SODIUM CHLORIDE 0.9 % IV SOLN
0.0000 ug/h | INTRAVENOUS | Status: DC
  Administered 2017-09-21 (×2): 50 ug/h via INTRAVENOUS
  Filled 2017-09-21: qty 50

## 2017-09-21 MED ORDER — FERROUS SULFATE 325 (65 FE) MG PO TABS
325.0000 mg | ORAL_TABLET | Freq: Every day | ORAL | Status: DC
  Administered 2017-09-22 – 2017-10-17 (×26): 325 mg via ORAL
  Filled 2017-09-21 (×25): qty 1

## 2017-09-21 MED ORDER — LIDOCAINE HCL (PF) 1 % IJ SOLN
INTRAMUSCULAR | Status: DC | PRN
  Administered 2017-09-21: 2 mL

## 2017-09-21 MED ORDER — PANTOPRAZOLE SODIUM 40 MG PO TBEC: 40.0000 mg | DELAYED_RELEASE_TABLET | Freq: Every day | ORAL | Status: DC

## 2017-09-21 MED ORDER — SODIUM CHLORIDE 0.9 % WEIGHT BASED INFUSION: 1.0000 mL/kg/h | INTRAVENOUS | Status: DC

## 2017-09-21 MED ORDER — TIROFIBAN (AGGRASTAT) BOLUS VIA INFUSION
INTRAVENOUS | Status: DC | PRN
  Administered 2017-09-21: 1587.5 ug via INTRAVENOUS

## 2017-09-21 MED ORDER — IOPAMIDOL (ISOVUE-370) INJECTION 76%
INTRAVENOUS | Status: DC | PRN
  Administered 2017-09-21: 135 mL via INTRAVENOUS

## 2017-09-21 MED ORDER — ATORVASTATIN CALCIUM 80 MG PO TABS
80.0000 mg | ORAL_TABLET | Freq: Every day | ORAL | Status: DC
  Administered 2017-09-22 – 2017-10-16 (×24): 80 mg via ORAL
  Filled 2017-09-21 (×24): qty 1

## 2017-09-21 MED ORDER — SODIUM CHLORIDE 0.9% FLUSH: 3.0000 mL | INTRAVENOUS | Status: DC | PRN

## 2017-09-21 MED ORDER — VERAPAMIL HCL 2.5 MG/ML IV SOLN
INTRAVENOUS | Status: AC
  Filled 2017-09-21: qty 2

## 2017-09-21 MED ORDER — SODIUM CHLORIDE 0.9% FLUSH
3.0000 mL | INTRAVENOUS | Status: DC | PRN
  Administered 2017-09-23: 3 mL via INTRAVENOUS
  Filled 2017-09-21: qty 3

## 2017-09-21 MED ORDER — LIDOCAINE HCL (PF) 1 % IJ SOLN
INTRAMUSCULAR | Status: AC
  Filled 2017-09-21: qty 30

## 2017-09-21 MED ORDER — OCUVITE-LUTEIN PO CAPS
1.0000 | ORAL_CAPSULE | Freq: Two times a day (BID) | ORAL | Status: DC
  Administered 2017-09-21: 1 via ORAL
  Filled 2017-09-21 (×3): qty 1

## 2017-09-21 MED ORDER — CALCIUM CARBONATE-VITAMIN D 500-200 MG-UNIT PO TABS
2.0000 | ORAL_TABLET | Freq: Every day | ORAL | Status: DC
  Administered 2017-09-21 – 2017-10-13 (×19): 2 via ORAL
  Administered 2017-10-14: 1 via ORAL
  Administered 2017-10-15 – 2017-10-17 (×3): 2 via ORAL
  Filled 2017-09-21 (×28): qty 2

## 2017-09-21 MED ORDER — SODIUM CHLORIDE 0.9% FLUSH: 3.0000 mL | Freq: Two times a day (BID) | INTRAVENOUS | Status: DC

## 2017-09-21 MED ORDER — PROMETHAZINE HCL 25 MG/ML IJ SOLN
6.2500 mg | Freq: Once | INTRAMUSCULAR | Status: AC
  Administered 2017-09-21: 17:00:00 6.25 mg via INTRAVENOUS
  Filled 2017-09-21: qty 1

## 2017-09-21 MED ORDER — FENTANYL 2500MCG IN NS 250ML (10MCG/ML) PREMIX INFUSION: 25.0000 ug/h | INTRAVENOUS | Status: DC

## 2017-09-21 MED ORDER — ENOXAPARIN SODIUM 40 MG/0.4ML ~~LOC~~ SOLN
40.0000 mg | SUBCUTANEOUS | Status: DC
  Administered 2017-09-22 – 2017-09-26 (×5): 40 mg via SUBCUTANEOUS
  Filled 2017-09-21 (×5): qty 0.4

## 2017-09-21 MED ORDER — LEVOTHYROXINE SODIUM 100 MCG PO TABS
100.0000 ug | ORAL_TABLET | Freq: Every day | ORAL | Status: DC
  Administered 2017-09-22 – 2017-10-17 (×27): 100 ug via ORAL
  Filled 2017-09-21 (×26): qty 1

## 2017-09-21 MED ORDER — SODIUM CHLORIDE 0.9% FLUSH
3.0000 mL | Freq: Two times a day (BID) | INTRAVENOUS | Status: DC
  Administered 2017-09-23 – 2017-09-26 (×5): 3 mL via INTRAVENOUS

## 2017-09-21 MED ORDER — SUCCINYLCHOLINE CHLORIDE 20 MG/ML IJ SOLN
INTRAMUSCULAR | Status: DC | PRN
  Administered 2017-09-21: 80 mg via INTRAVENOUS

## 2017-09-21 MED ORDER — ATENOLOL 25 MG PO TABS
25.0000 mg | ORAL_TABLET | Freq: Every day | ORAL | Status: DC
  Administered 2017-09-22: 25 mg via ORAL
  Filled 2017-09-21: qty 1

## 2017-09-21 MED ORDER — PROPOFOL 10 MG/ML IV BOLUS
INTRAVENOUS | Status: DC | PRN
  Administered 2017-09-21: 80 mg via INTRAVENOUS

## 2017-09-21 MED ORDER — ORAL CARE MOUTH RINSE: 15.0000 mL | Freq: Four times a day (QID) | OROMUCOSAL | Status: DC

## 2017-09-21 SURGICAL SUPPLY — 22 items
BALLN SAPPHIRE 2.5X12 (BALLOONS) ×2
BALLN SAPPHIRE ~~LOC~~ 2.75X15 (BALLOONS) ×2 IMPLANT
BALLN SAPPHIRE ~~LOC~~ 3.0X12 (BALLOONS) ×2 IMPLANT
BALLOON SAPPHIRE 2.5X12 (BALLOONS) ×1 IMPLANT
BAND ZEPHYR COMPRESS 30 LONG (HEMOSTASIS) ×2 IMPLANT
CATH INFINITI 5 FR JL3.5 (CATHETERS) ×2 IMPLANT
CATH INFINITI JR4 5F (CATHETERS) ×2 IMPLANT
CATH VISTA GUIDE 6FR JR4 (CATHETERS) ×2 IMPLANT
COVER PRB 48X5XTLSCP FOLD TPE (BAG) ×1 IMPLANT
COVER PROBE 5X48 (BAG) ×1
GLIDESHEATH SLEND A-KIT 6F 22G (SHEATH) ×2 IMPLANT
GUIDEWIRE INQWIRE 1.5J.035X260 (WIRE) ×1 IMPLANT
INQWIRE 1.5J .035X260CM (WIRE) ×2
KIT ENCORE 26 ADVANTAGE (KITS) ×2 IMPLANT
KIT HEART LEFT (KITS) ×2 IMPLANT
KIT HEMO VALVE WATCHDOG (MISCELLANEOUS) ×2 IMPLANT
PACK CARDIAC CATHETERIZATION (CUSTOM PROCEDURE TRAY) ×2 IMPLANT
STENT SYNERGY DES 2.50X8 (Permanent Stent) ×2 IMPLANT
STENT SYNERGY DES 2.5X20 (Permanent Stent) ×2 IMPLANT
TRANSDUCER W/STOPCOCK (MISCELLANEOUS) ×2 IMPLANT
TUBING CIL FLEX 10 FLL-RA (TUBING) ×2 IMPLANT
WIRE ASAHI PROWATER 180CM (WIRE) ×2 IMPLANT

## 2017-09-21 SURGICAL SUPPLY — 20 items
BAG MDU5 PLUS (MISCELLANEOUS) ×2 IMPLANT
BALLN SAPPHIRE 2.5X12 (BALLOONS) ×2
BALLN ~~LOC~~ EMERGE MR 3.25X20 (BALLOONS) ×2
BALLN ~~LOC~~ EMERGE MR 3.5X12 (BALLOONS) ×2
BALLOON SAPPHIRE 2.5X12 (BALLOONS) ×1 IMPLANT
BALLOON ~~LOC~~ EMERGE MR 3.25X20 (BALLOONS) ×1 IMPLANT
BALLOON ~~LOC~~ EMERGE MR 3.5X12 (BALLOONS) ×1 IMPLANT
CATH LAUNCHER 6FR JR4 (CATHETERS) ×2 IMPLANT
CATH OPTICROSS 40MHZ (CATHETERS) ×2 IMPLANT
KIT ENCORE 26 ADVANTAGE (KITS) ×2 IMPLANT
KIT HEART LEFT (KITS) ×2 IMPLANT
PACK CARDIAC CATHETERIZATION (CUSTOM PROCEDURE TRAY) ×2 IMPLANT
SHEATH AVANTI 11CM 5FR (SHEATH) ×2 IMPLANT
SHEATH AVANTI 11CM 6FR (SHEATH) ×2 IMPLANT
SLED PULL BACK IVUS (MISCELLANEOUS) ×2 IMPLANT
STENT SYNERGY DES 2.5X16 (Permanent Stent) ×2 IMPLANT
TRANSDUCER W/STOPCOCK (MISCELLANEOUS) ×2 IMPLANT
TUBING CIL FLEX 10 FLL-RA (TUBING) ×2 IMPLANT
WIRE ASAHI PROWATER 180CM (WIRE) ×4 IMPLANT
WIRE EMERALD 3MM-J .035X150CM (WIRE) ×2 IMPLANT

## 2017-09-21 NOTE — Code Documentation (Signed)
CODE BLUE NOTE  Patient Name: [name]   MRN: [id number]   Date of Birth/ Sex: 1940-08-24 , female      Admission Date: 09/21/2017  Attending Provider: Belva Crome, MD  Primary Diagnosis: CAD in native artery    Indication: Pt was in her usual state of health until this PM, when she was noted to have abdominal pain, had maalox and then became unresponsive. Per RN patient was in A fib with RVR prior to code team arrival. Code blue was subsequently called. At the time of arrival on scene, ACLS protocol was underway. Patient in [place]. Had an episode of possible torsades.     Technical Description:  - CPR performance duration:  6 minute  - Was defibrillation or cardioversion used? Yes x 3  - Was external pacer placed? No  - Was patient intubated pre/post CPR? Yes, post resucitation but prior to cardiac cath     Medications Administered: Y = Yes; Blank = No Amiodarone  Y  Atropine    Calcium    Epinephrine    Lidocaine    Magnesium  Y  Norepinephrine    Phenylephrine    Sodium bicarbonate    Vasopressin    Phenergan          Y Morphine         Y  Post CPR evaluation:  - Final Status - Was patient successfully resuscitated ? Yes - What is current rhythm? Sinus  - What is current hemodynamic status? Stable, in cath lab, intubated   Miscellaneous Information:  - Labs sent, including: none  - Primary team notified?  Yes, primary MD took patient to cath   - Family Notified? Yes, family at bedside   - Additional notes/ transfer status: Patient became conscious and taken to cath lab immediately following resuscitation. Prior to cath patient was intubated for procedure.         Caroline More, DO  PGY-1 09/21/2017, 5:08 PM

## 2017-09-21 NOTE — Anesthesia Procedure Notes (Signed)
Procedure Name: Intubation Date/Time: 09/21/2017 5:01 PM Performed by: Oletta Lamas, CRNA Pre-anesthesia Checklist: Patient identified, Emergency Drugs available, Suction available and Patient being monitored Patient Re-evaluated:Patient Re-evaluated prior to induction Oxygen Delivery Method: Circle System Utilized Preoxygenation: Pre-oxygenation with 100% oxygen Induction Type: IV induction and Rapid sequence Ventilation: Mask ventilation without difficulty Laryngoscope Size: Mac and 3 Grade View: Grade I Tube type: Oral Number of attempts: 1 Airway Equipment and Method: Stylet Placement Confirmation: ETT inserted through vocal cords under direct vision,  positive ETCO2 and breath sounds checked- equal and bilateral Secured at: 22 cm Tube secured with: Tape Dental Injury: Teeth and Oropharynx as per pre-operative assessment  Comments: Intubation performed by Dr. Ermalene Postin

## 2017-09-21 NOTE — Interval H&P Note (Signed)
Cath Lab Visit (complete for each Cath Lab visit)  Clinical Evaluation Leading to the Procedure:   ACS: No.  Non-ACS:    Anginal Classification: CCS III  Anti-ischemic medical therapy: Minimal Therapy (1 class of medications)  Non-Invasive Test Results: High-risk stress test findings: cardiac mortality >3%/year  Prior CABG: Previous CABG      History and Physical Interval Note:  09/21/2017 7:36 AM  [name]  has presented today for surgery, with the diagnosis of abnormal ct  The various methods of treatment have been discussed with the patient and family. After consideration of risks, benefits and other options for treatment, the patient has consented to  Procedure(s): LEFT HEART CATH AND CORONARY ANGIOGRAPHY (N/A) as a surgical intervention .  The patient's history has been reviewed, patient examined, no change in status, stable for surgery.  I have reviewed the patient's chart and labs.  Questions were answered to the patient's satisfaction.     Belva Crome III

## 2017-09-21 NOTE — Consult Note (Signed)
PULMONARY / CRITICAL CARE MEDICINE   Name: DONDI BURANDT MRN: [id number] DOB: 1940/11/20    ADMISSION DATE:  09/21/2017 CONSULTATION DATE:  09/21/2017  REFERRING MD:  Dr. Tamala Julian  CHIEF COMPLAINT:  Cardiac Arrest  HISTORY OF PRESENT ILLNESS:   HPI obtained from medical chart review as patient is intubated on mechanical ventilation and unable to provide history.  41 yoF with PMH of former smoker, HTN, HLD, anemia, LBBB, CKD, hypothyroidism, GERD, non small cell lung cancer (s/p RUL lobectomy 2009, radiation to LUL nodule 2013, radiation to lymphadenopathy 20015, tarceva rx, thermal ablation of recurrent LUL adenocarcinoma 2016), and arthritis who was admitted 3/14 for elective cardiac catheterization after abnormal coronary CT 09/18/2017 showing severe stenosis in the proximal to mid RCA, mid LAD, and mid LCX arteries.  Patient has been followed closely by Cardiology for elevated/ fluctuating hypertension since her husband passed away in Jan 23, 2017 with progressive dyspnea on exertion.    Patient underwent scheduled LHC using the right radial artery during the morning with 2 stents placed to the RCA.  On the post recovery unit, late afternoon, patient developed abdominal pain, noted to be in rapid Afib with RVR and became unresponsive.  Upon arrival of code team, patient found in [place], defibrillated x 3, underwent ACLS protocol for 6 minutes prior to [place].  She was intubated and taken back for emergent cath noting acute occlusion of mid RCA from previous stents placed earlier.  The RCA was recanalized with balloon angioplasty and re-stenting of RCA.  Patient noted to be waking up on vent and follows intermittent commands requiring continuous sedation.  She returns to the ICU to rest overnight on vent.  PCCM consulted for ventilator management.    PAST MEDICAL HISTORY :  She  has a past medical history of Anemia (09/2017), Arthritis, Cervical ca ([place]) (dx'd 1988), CKD (chronic kidney disease), stage III  ([place]), Complication of anesthesia, Coronary artery calcification seen on CT scan, Dyspnea, GERD (gastroesophageal reflux disease), Headache, History of hiatal hernia, radiation therapy (09/16/13-09/30/13), Hyperlipidemia, Hypertension, Hyperthyroidism, Hypothyroidism, LBBB (left bundle branch block), Lung cancer ([place]), Macular degeneration, Melanoma ([place]), Radiation (10/11/11-10/21/11), and Tremor.  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  Allergies  Allergen Reactions  . Meperidine Hcl Other (See Comments)    "knocked pt out for 4 days"  . Penicillins Anaphylaxis, Shortness Of Breath and Other (See Comments)    Difficulty breathing  . Morphine Other (See Comments)    Stopped breathing due to too high of a dose per patient.  Not an allergic reaction to morphine  . Oxycodone-Acetaminophen Itching       . Propoxyphene N-Acetaminophen Nausea And Vomiting  . Tramadol Hcl Nausea And Vomiting    No current facility-administered medications on file prior to encounter.    Current Outpatient Medications on File Prior to Encounter  Medication Sig  . acetaminophen (TYLENOL) 650 MG CR tablet Take 1,300 mg by mouth 2 (two) times daily.   Marland Kitchen aspirin EC 81 MG tablet Take 1 tablet (81 mg total) by mouth daily.  Marland Kitchen atenolol (TENORMIN) 25 MG tablet TAKE 1 TABLET(25 MG) BY MOUTH TWICE DAILY  .  Calcium Carbonate-Vitamin D3 (CALCIUM 600-D) 600-400 MG-UNIT TABS Take 2 tablets by mouth daily.   Marland Kitchen diltiazem (CARDIZEM CD) 240 MG  24 hr capsule Take 240 mg by mouth daily.  . ferrous sulfate 325 (65 FE) MG tablet Take 325 mg by mouth daily with breakfast.  . fluticasone (FLONASE) 50 MCG/ACT nasal spray Place 1 spray into both nostrils daily as needed for allergies.   Marland Kitchen gabapentin (NEURONTIN) 300 MG capsule Take 600 mg by mouth at bedtime.   . hydrochlorothiazide (HYDRODIURIL) 25 MG tablet Take 25 mg by mouth daily.  Marland Kitchen levothyroxine (SYNTHROID, LEVOTHROID) 100 MCG tablet TAKE 1 TABLET(100 MCG) BY MOUTH DAILY BEFORE BREAKFAST  . Multiple Vitamin (MULTIVITAMIN WITH MINERALS) TABS tablet Take 1 tablet by mouth daily.  . Multiple Vitamins-Minerals (PRESERVISION AREDS 2 PO) Take 1 capsule by mouth 2 (two) times daily.  . nitroGLYCERIN (NITROSTAT) 0.4 MG SL tablet Place 1 tablet (0.4 mg total) under the tongue every 5 (five) minutes as needed for chest pain.  . pantoprazole (PROTONIX) 40 MG tablet Take 40 mg by mouth daily.   . pravastatin (PRAVACHOL) 20 MG tablet Take 1 tablet (20 mg total) by mouth every evening.    FAMILY HISTORY:  Her indicated that her mother is deceased. She indicated that her father is deceased. She indicated that her maternal grandmother is deceased. She indicated that her maternal grandfather is deceased. She indicated that her paternal grandmother is deceased. She indicated that her paternal grandfather is deceased. She indicated that her daughter is alive. She indicated that her son is alive. She indicated that the status of her maternal uncle is unknown. She indicated that the status of her neg hx is unknown.   SOCIAL HISTORY: She  reports that she quit smoking about 42 years ago. Her smoking use included cigarettes. She has a 17.00 pack-year smoking history. she has never used smokeless tobacco. She reports that she drinks alcohol. She reports that she does not use drugs.  REVIEW OF SYSTEMS:   Unable to assess as patient is intubated and sedated on mechanical ventilation.  SUBJECTIVE:  Sedation at  Versed at 1mg /hr, fentanyl 125 mcg/hr  VITAL SIGNS: BP 120/60   Pulse 74   Temp 97.8 F (36.6 C) (Oral)   Resp (!) 0   Ht 5' 4.5" (1.638 m)   Wt 140 lb (63.5 kg)   SpO2 (!) 0%   BMI 23.66 kg/m   HEMODYNAMICS:    VENTILATOR SETTINGS: Vent Mode: PRVC FiO2 (%):  [100 %] 100 % Set Rate:  [16 bmp] 16 bmp Vt Set:  [400 mL] 400 mL PEEP:  [5 cmH20] 5 cmH20 Plateau Pressure:  [13 cmH20] 13 cmH20  INTAKE / OUTPUT: I/O last 3 completed shifts: In: 240 [P.O.:240] Out: 325 [Urine:325]  PHYSICAL EXAMINATION: General:  Critically ill elderly female in NAD on mechanical ventilation HEENT: Pupils 3/equal, ETT/ OGT, +JVD Neuro: Sedate, awakens startled by touch, f/c intermittently, MAE CV: s1s2 rrr, no m/r/g, R fem site oozy, +dp PULM: even/non-labored on MV, lungs bilaterally clear GI: obese, soft, non-tender, bs hypoactive  Extremities: warm/dry, no edema  Skin: no rashes  LABS:  BMET Recent Labs  Lab 09/20/17 0958 09/21/17 1023 09/21/17 1642  NA 137  --   --   K 4.6  --   --   CL 100  --   --   CO2 31*  --   --   BUN 25  --   --   CREATININE 1.23* 1.24* 1.34*  GLUCOSE 76  --   --     Electrolytes Recent Labs  Lab 09/20/17 0958  CALCIUM 11.5*    CBC Recent Labs  Lab 09/20/17 0958 09/21/17 1023 09/21/17 1642  WBC 10.7 12.7* 16.8*  HGB 10.4* 10.3* 10.4*  HCT 32.3* 34.1* 34.6*  PLT 330 329 293    Coag's Recent Labs  Lab 09/20/17 0958  INR 0.9    Sepsis Markers No results for input(s): LATICACIDVEN, PROCALCITON, O2SATVEN in the last 168 hours.  ABG No results for input(s): PHART, PCO2ART, PO2ART in the last 168 hours.  Liver Enzymes No results for input(s): AST, ALT, ALKPHOS, BILITOT, ALBUMIN in the last 168 hours.  Cardiac Enzymes No results for input(s): TROPONINI, PROBNP in the last 168 hours.  Glucose No results for input(s): GLUCAP in the last 168 hours.  Imaging No results found.  STUDIES:  3/14 LHC >>   Segmental proximal to  mid 90% RCA stenosis.  This lesion was treated with stenting using overlapping Synergy 2.5 mm stents to postdilated to 3 mm in diameter with TIMI grade III flow.  0% stenosis was noted post procedure.  Ostial 50-60% circumflex narrowing.  Luminal irregularities throughout the proximal to mid LAD.  Very distal eccentric tandem 70% stenoses near the apex.  No focally high-grade stenosis is noted.  Normal left main  Low normal LV systolic function in the 05-39% range.  Upper normal left ventricular end-diastolic pressure of 17 mmHg  3/14 LHC >>  Acute occlusion of the mid right coronary distal to the previously placed stents from earlier in the day.  Acute occlusion was accompanied by ventricular fibrillation.  The patient was successfully resuscitated.  Successful recanalization of the right coronary and placement of an additional stent overlapping the distal margin of the previously placed stent.  Final stent diameter 3.25 mm throughout the entire stented segment from distal to proximal.  3.5 mm diameter Lincoln balloon was used at the very proximal stent margin after intravascular ultrasound demonstrated decreased apposition.  TIMI grade III flow with no evidence of stenosis postprocedure.  Intravascular ultrasound was used and demonstrated acceptable stent apposition from distal to proximal with the exception of the proximal margin which was underexpanded.  As noted above a 3.5 balloon was used to further expand the stent  CULTURES: none  ANTIBIOTICS: none  SIGNIFICANT EVENTS: 3/14  Admitted for LHC, cardiac arrest, repeat LHC  LINES/TUBES: PIV x 1 ETT 3/14 >> R femoral sheath >>  DISCUSSION: 64 yoF admitted 3/14 for elective LHC after progressive DOE and abnormal coronary CT found to have significant 3 vessel disease, underwent PCI with 2 stents to RCA.  On post cath unit later that day, went into Afib w/RVR, went unresponsive, Vfib arrest, defib x 3, ROSC after 6 mins, intubated, and  taken for [facility] for balloon angioplasty and re-stent of RCA.  Awakens and follows commands post arrest.  To rest on MV overnight.    ASSESSMENT / PLAN:  PULMONARY A: Acute respiratory insufficiency in the setting of cardiac arrest  Hx of NSLC s/p  P:   Continue full MV, PRVC 8 cc/kg ABG and CXR now Wean FiO2 for sats > 94% VAP protocol To rest overnight, likely to extubate in am if remains stable overnight  CARDIOVASCULAR A:  Vfib arrest-  ROSC after 6 mins, due to RCA re-occlusion s/p LHC x 2 Hx HTN, HLD, CAD - LVEF 50-55% on first [facility] 3/14 P:  Tele monitoring Per Cards Goal MAP > 65, currently  not requiring vasopressor support Trending troponins/ EKG Sheath to be removed per cards Aggrastat d/c'd per cards Plavix and asa per cards  RENAL A:   At risk for AKI- given contrast load x 2 Hx CKD P:   NS per cath orders, 95.3 ml/hr x 10 hours  Monitor for urinary retention- bladder scan prn, place foley for residual > 350 ml BMET/ Mg/ Phos now Trend BMP / urinary output/ daily weights Replace electrolytes as indicated  GASTROINTESTINAL A:   No acute issues  Hx GERD P:   Place OGT  NPO PPI for SUP  Bowel regimen for PAD protocol   HEMATOLOGIC A:   Hx Anemia Mild leukocytosis - likely reactive, afebrile, no obvious source of infection P:  Systemically anticoagulated per cards- Aggrastat d/c'd SCDs  Trend CBC lovenox per cards 3/15 Transfuse for Hgb > 8  INFECTIOUS A:   No known acute process P:   Monitor clinically, CXR pending- consider aspiration  Trend WBC/ fever curve   ENDOCRINE A:   Hx Hypothyroidism  P:   CBG q 4 SSI synthroid  NEUROLOGIC A:   Acute encephalopathy related to sedation - wakes up/ follows commands post arrest P:   RASS goal: -1 PAD protocol with fentany gtt/ limit versed gtt Trend neuro exams  FAMILY  - Updates: Patient's daughter and son updated at bedside.    - Inter-disciplinary family meet or Palliative Care  meeting due by: 3/21   Kennieth Rad, AGACNP-BC [place] Pgr: [phone] or if no answer [phone] 09/21/2017, 9:53 PM

## 2017-09-22 ENCOUNTER — Inpatient Hospital Stay (HOSPITAL_COMMUNITY): Payer: Medicare Other

## 2017-09-22 ENCOUNTER — Encounter (HOSPITAL_COMMUNITY): Payer: Self-pay | Admitting: Interventional Cardiology

## 2017-09-22 DIAGNOSIS — E7800 Pure hypercholesterolemia, unspecified: Secondary | ICD-10-CM

## 2017-09-22 DIAGNOSIS — I10 Essential (primary) hypertension: Secondary | ICD-10-CM

## 2017-09-22 DIAGNOSIS — J9601 Acute respiratory failure with hypoxia: Secondary | ICD-10-CM

## 2017-09-22 DIAGNOSIS — T82867A Thrombosis of cardiac prosthetic devices, implants and grafts, initial encounter: Secondary | ICD-10-CM

## 2017-09-22 LAB — CBC
HCT: 23.4 % — ABNORMAL LOW (ref 36.0–46.0)
HCT: 26.1 % — ABNORMAL LOW (ref 36.0–46.0)
HEMOGLOBIN: 7.1 g/dL — AB (ref 12.0–15.0)
Hemoglobin: 7.9 g/dL — ABNORMAL LOW (ref 12.0–15.0)
MCH: 26.3 pg (ref 26.0–34.0)
MCH: 26.4 pg (ref 26.0–34.0)
MCHC: 30.3 g/dL (ref 30.0–36.0)
MCHC: 30.3 g/dL (ref 30.0–36.0)
MCV: 86.7 fL (ref 78.0–100.0)
MCV: 87.3 fL (ref 78.0–100.0)
PLATELETS: 288 10*3/uL (ref 150–400)
PLATELETS: 318 10*3/uL (ref 150–400)
RBC: 2.7 MIL/uL — AB (ref 3.87–5.11)
RBC: 2.99 MIL/uL — AB (ref 3.87–5.11)
RDW: 15.8 % — ABNORMAL HIGH (ref 11.5–15.5)
RDW: 16.1 % — ABNORMAL HIGH (ref 11.5–15.5)
WBC: 18.6 10*3/uL — AB (ref 4.0–10.5)
WBC: 19 10*3/uL — AB (ref 4.0–10.5)

## 2017-09-22 LAB — POCT I-STAT 3, ART BLOOD GAS (G3+)
Acid-base deficit: 4 mmol/L — ABNORMAL HIGH (ref 0.0–2.0)
Bicarbonate: 21.3 mmol/L (ref 20.0–28.0)
O2 Saturation: 99 %
PCO2 ART: 39.6 mmHg (ref 32.0–48.0)
TCO2: 23 mmol/L (ref 22–32)
pH, Arterial: 7.337 — ABNORMAL LOW (ref 7.350–7.450)
pO2, Arterial: 171 mmHg — ABNORMAL HIGH (ref 83.0–108.0)

## 2017-09-22 LAB — TROPONIN I
TROPONIN I: 7.6 ng/mL — AB (ref <0.03)
TROPONIN I: 9.17 ng/mL — AB (ref <0.03)
TROPONIN I: 6.25 ng/mL — AB (ref <0.03)

## 2017-09-22 LAB — GLUCOSE, CAPILLARY
GLUCOSE-CAPILLARY: 144 mg/dL — AB (ref 65–99)
GLUCOSE-CAPILLARY: 125 mg/dL — AB (ref 65–99)
Glucose-Capillary: 125 mg/dL — ABNORMAL HIGH (ref 65–99)

## 2017-09-22 LAB — BASIC METABOLIC PANEL
Anion gap: 11 (ref 5–15)
BUN: 31 mg/dL — AB (ref 6–20)
CALCIUM: 8.4 mg/dL — AB (ref 8.9–10.3)
CO2: 20 mmol/L — ABNORMAL LOW (ref 22–32)
CREATININE: 1.42 mg/dL — AB (ref 0.44–1.00)
Chloride: 108 mmol/L (ref 101–111)
GFR calc Af Amer: 40 mL/min — ABNORMAL LOW (ref >60)
GFR, EST NON AFRICAN AMERICAN: 35 mL/min — AB (ref >60)
GLUCOSE: 178 mg/dL — AB (ref 65–99)
Potassium: 4.2 mmol/L (ref 3.5–5.1)
Sodium: 139 mmol/L (ref 135–145)

## 2017-09-22 LAB — MRSA PCR SCREENING: MRSA BY PCR: NEGATIVE

## 2017-09-22 LAB — POCT ACTIVATED CLOTTING TIME: ACTIVATED CLOTTING TIME: 142 s

## 2017-09-22 LAB — PREPARE RBC (CROSSMATCH)

## 2017-09-22 MED ORDER — FUROSEMIDE 10 MG/ML IJ SOLN
20.0000 mg | Freq: Once | INTRAMUSCULAR | Status: AC
  Administered 2017-09-22: 20 mg via INTRAVENOUS
  Filled 2017-09-22: qty 2

## 2017-09-22 MED ORDER — SODIUM CHLORIDE 0.9 % IV SOLN: Freq: Once | INTRAVENOUS | Status: DC

## 2017-09-22 MED ORDER — ASPIRIN 81 MG PO CHEW
81.0000 mg | CHEWABLE_TABLET | Freq: Every day | ORAL | Status: DC
  Administered 2017-09-23 – 2017-09-27 (×5): 81 mg via ORAL
  Filled 2017-09-22 (×5): qty 1

## 2017-09-22 MED ORDER — ACETAMINOPHEN 325 MG PO TABS
650.0000 mg | ORAL_TABLET | Freq: Four times a day (QID) | ORAL | Status: DC | PRN
  Administered 2017-09-23 – 2017-09-24 (×4): 650 mg via ORAL
  Filled 2017-09-22 (×4): qty 2

## 2017-09-22 MED ORDER — PANTOPRAZOLE SODIUM 40 MG PO TBEC
40.0000 mg | DELAYED_RELEASE_TABLET | Freq: Every day | ORAL | Status: DC
  Administered 2017-09-23 – 2017-09-26 (×4): 40 mg via ORAL
  Filled 2017-09-22 (×4): qty 1

## 2017-09-22 MED ORDER — ORAL CARE MOUTH RINSE
15.0000 mL | Freq: Two times a day (BID) | OROMUCOSAL | Status: DC
  Administered 2017-09-22 – 2017-10-16 (×20): 15 mL via OROMUCOSAL

## 2017-09-22 MED ORDER — FENTANYL CITRATE (PF) 100 MCG/2ML IJ SOLN
25.0000 ug | INTRAMUSCULAR | Status: DC | PRN
  Administered 2017-09-22 (×3): 50 ug via INTRAVENOUS
  Administered 2017-09-23 (×2): 25 ug via INTRAVENOUS
  Administered 2017-09-24 – 2017-09-25 (×4): 50 ug via INTRAVENOUS
  Filled 2017-09-22 (×10): qty 2

## 2017-09-22 MED ORDER — ATROPINE SULFATE 1 MG/10ML IJ SOSY
PREFILLED_SYRINGE | INTRAMUSCULAR | Status: AC
  Filled 2017-09-22: qty 10

## 2017-09-22 MED FILL — Heparin Sodium (Porcine) 2 Unit/ML in Sodium Chloride 0.9%: INTRAMUSCULAR | Qty: 1000 | Status: AC

## 2017-09-22 MED FILL — Lidocaine HCl Local Inj 1%: INTRAMUSCULAR | Qty: 10 | Status: AC

## 2017-09-22 MED FILL — Verapamil HCl IV Soln 2.5 MG/ML: INTRAVENOUS | Qty: 2 | Status: AC

## 2017-09-22 MED FILL — Medication: Qty: 1 | Status: AC

## 2017-09-22 MED FILL — Tirofiban HCl in NaCl 0.9% IV Soln 5 MG/100ML (Base Equiv): INTRAVENOUS | Qty: 100 | Status: AC

## 2017-09-22 NOTE — Progress Notes (Signed)
MD notified of Trop 7.6, 2200 HGB/HCT  7.1/23.4. Rt groin bleed. Will continue to monitor am labs

## 2017-09-22 NOTE — Progress Notes (Signed)
Reassessment of rt groin shows active bleeding from arterial sheath site with blood between legs and on pad. Groin with small hematoma. Direct pressure held to site X 20 minutes. Bleeding stopped, hematoma pressed away. Groin level 0. Site redressed with pressure dressing. Will check am labs in 1 hour.

## 2017-09-22 NOTE — Progress Notes (Addendum)
Femoral arterial sheath removed @ 0112, manual pressure applied for 25 minutes, hemostasis achieved. Femoral venous sheath removed @ 0127, manual pressure held, hemostasis achieved. Distal pulses present throughout procedure. Both sites level 0. Unable to educate pt at this time due to intubation/sedation. Claudina Lick second RN present.  Sherlie Ban, RN

## 2017-09-22 NOTE — Progress Notes (Signed)
   Status post acute vessel closure >6 hours post procedure.  Finding total occlusion distal to the implanted stents possibly related to distal edge dissection although was not apparent on the post implant images.  She was restented from the distal margin of the previous stents into the distal vessel.  3.25 and 3.5 mm postdilatation throughout the entire stented segment.  3.5 was used after intravascular ultrasound demonstrated poor apposition of the very proximal portion of the previously placed stent.  She was intubated to have controlled during the emergency procedure.  She is awake this morning, able to follow commands, and we will hopefully be able to extubate her later today.  She had a mild bleed of the right groin.  Aggrastat was discontinued after only 1 hour of infusion last evening.  Concerns include acute kidney injury and fall in hemoglobin from 10 to 7.  Plan today is extubation early, transfusion if needed, and continue Plavix as antiplatelet therapy.  Groin should be followed closely.

## 2017-09-22 NOTE — Progress Notes (Addendum)
Progress Note  Patient Name: [name] Date of Encounter: 09/22/2017  Primary Cardiologist: No primary care provider on file.   Subjective   The patient was intubated today during evaluation.  Inpatient Medications    Scheduled Meds: . [START ON 09/23/2017] aspirin  81 mg Oral Daily  . atenolol  25 mg Oral Daily  . atorvastatin  80 mg Oral q1800  . atropine      . calcium-vitamin D  2 tablet Oral Daily  . clopidogrel  75 mg Oral Q breakfast  . enoxaparin (LOVENOX) injection  40 mg Subcutaneous Q24H  . ferrous sulfate  325 mg Oral Q breakfast  . gabapentin  600 mg Oral QHS  . levothyroxine  100 mcg Oral QAC breakfast  . mouth rinse  15 mL Mouth Rinse BID  . multivitamin with minerals  1 tablet Oral Daily  . pantoprazole  40 mg Oral Q1200  . sodium chloride flush  3 mL Intravenous Q12H  . sodium chloride flush  3 mL Intravenous Q12H   Continuous Infusions: . sodium chloride Stopped (09/21/17 2100)   PRN Meds: sodium chloride, acetaminophen, alum & mag hydroxide-simeth, bisacodyl, fentaNYL (SUBLIMAZE) injection, nitroGLYCERIN, ondansetron (ZOFRAN) IV, sodium chloride flush, sodium chloride flush   Vital Signs    Vitals:   09/22/17 0930 09/22/17 1000 09/22/17 1030 09/22/17 1100  BP: (!) 114/54 140/66 (!) 136/54 (!) 150/64  Pulse:      Resp: 13 (!) 25 16 19   Temp:      TempSrc:      SpO2: 100%   100%  Weight:      Height:        Intake/Output Summary (Last 24 hours) at 09/22/2017 1111 Last data filed at 09/22/2017 0813 Gross per 24 hour  Intake 1463.33 ml  Output 1050 ml  Net 413.33 ml   Filed Weights   09/21/17 0608 09/22/17 0500  Weight: 63.5 kg (140 lb) 65.3 kg (143 lb 15.4 oz)    Telemetry    Normal sinus rhythm- Personally Reviewed  ECG    LBBB with diffuse ST aberration in the anterolateral leads consistent with a previous evaluation to be expected given the diagnosis- Personally Reviewed  Physical Exam   GEN:  Patient currently on the  ventilator.  Vitals are stable Neck: No JVD Cardiac: RRR, no murmurs, rubs, or gallops.  Respiratory: Clear to auscultation bilaterally. GI: Soft, nontender, non-distended  MS: No edema; No deformity. Neuro:  Nonfocal  Psych: Normal affect   Labs    Chemistry Recent Labs  Lab 09/20/17 0958  09/21/17 1642 09/21/17 2138 09/22/17 0118  NA 137  --   --  139 139  K 4.6  --   --  4.4 4.2  CL 100  --   --  109 108  CO2 31*  --   --  22 20*  GLUCOSE 76  --   --  158* 178*  BUN 25  --   --  27* 31*  CREATININE 1.23*   < > 1.34* 1.37* 1.42*  CALCIUM 11.5*  --   --  8.1* 8.4*  ALBUMIN  --   --   --  2.4*  --   GFRNONAA 43*   < > 37* 36* 35*  GFRAA 49*   < > 43* 42* 40*  ANIONGAP  --   --   --  8 11   < > = values in this interval not displayed.     Hematology Recent Labs  Lab 09/21/17 1642 09/21/17 2138 09/22/17 0845  WBC 16.8* 18.6* 19.0*  RBC 3.95 2.70* 2.99*  HGB 10.4* 7.1* 7.9*  HCT 34.6* 23.4* 26.1*  MCV 87.6 86.7 87.3  MCH 26.3 26.3 26.4  MCHC 30.1 30.3 30.3  RDW 15.9* 15.8* 16.1*  PLT 293 288 318    Cardiac Enzymes Recent Labs  Lab 09/22/17 0118 09/22/17 0413  TROPONINI 7.60* 9.17*   No results for input(s): TROPIPOC in the last 168 hours.   BNPNo results for input(s): BNP, PROBNP in the last 168 hours.   DDimer No results for input(s): DDIMER in the last 168 hours.   Radiology    Dg Abd 1 View  Result Date: 09/21/2017 CLINICAL DATA:  77 y/o  F; OG tube placement. EXAM: ABDOMEN - 1 VIEW COMPARISON:  03/07/2016 CT abdomen and pelvis FINDINGS: Enteric tube tip projects over gastric body. Transcutaneous pacing pads noted. Upper abdomen bowel gas pattern is normal. IMPRESSION: Enteric tube tip projects over gastric body. Electronically Signed   By: Kristine Garbe M.D.   On: 09/21/2017 22:04   Portable Chest Xray  Result Date: 09/22/2017 CLINICAL DATA:  Cardiac arrest. EXAM: PORTABLE CHEST 1 VIEW COMPARISON:  Chest x-rays dated 09/21/2017 and  06/21/2016 FINDINGS: Endotracheal tube appears in good position. NG tube tip is below the diaphragm. Heart size and vascularity are normal. Aortic atherosclerosis. Stable left lung mass at the superior aspect of the left hilum. The lungs are otherwise clear. IMPRESSION: Stable appearance of the chest. Electronically Signed   By: Lorriane Shire M.D.   On: 09/22/2017 09:01   Portable Chest X-ray  Result Date: 09/21/2017 CLINICAL DATA:  Endotracheal tube placement. EXAM: PORTABLE CHEST 1 VIEW COMPARISON:  CT of the chest performed 09/04/2017 FINDINGS: The patient's endotracheal tube ends 2-3 cm above the carina. The tip of the tube appears to abut the right tracheal wall. This could be rotated slightly leftward. The patient's enteric tube is noted extending below the diaphragm. The patient's left lung masses are again noted. No pleural effusion or pneumothorax is seen. The cardiomediastinal silhouette is normal in size. External pacing pads are noted. No acute osseous abnormalities are identified. IMPRESSION: 1. Endotracheal tube ends 2-3 cm above the carina. Tip of the tube appears to abut the right tracheal wall. This could be rotated slightly leftward. 2. Left lung masses again noted.  Lungs otherwise grossly clear. Electronically Signed   By: Garald Balding M.D.   On: 09/21/2017 22:03    Cardiac Studies   Cardiac cath 09/22/2017:  Acute occlusion of the mid right coronary distal to the previously placed stents from earlier in the day.  Acute occlusion was accompanied by ventricular fibrillation.  The patient was successfully resuscitated.  Successful recanalization of the right coronary and placement of an additional stent overlapping the distal margin of the previously placed stent.  Final stent diameter 3.25 mm throughout the entire stented segment from distal to proximal.  3.5 mm diameter [place] balloon was used at the very proximal stent margin after intravascular ultrasound demonstrated decreased  apposition.  TIMI grade III flow with no evidence of stenosis postprocedure.  Intravascular ultrasound was used and demonstrated acceptable stent apposition from distal to proximal with the exception of the proximal margin which was underexpanded.  As noted above a 3.5 balloon was used to further expand the stent.  RECOMMENDATION:  Aggrastat for 10 hours.  Continue Plavix.  Critical care medicine to manage the patient's ventilator.  Continue IV Versed and fentanyl for sedation  while on the ventilator.  Discussed the situation with family.  Do not anticipate discharge for several days.  IV hydration and tract kidney function.  Cardiac cath 09/21/2017:   Segmental proximal to mid 90% RCA stenosis.  This lesion was treated with stenting using overlapping Synergy 2.5 mm stents to postdilated to 3 mm in diameter with TIMI grade III flow.  0% stenosis was noted post procedure.  Ostial 50-60% circumflex narrowing.  Luminal irregularities throughout the proximal to mid LAD.  Very distal eccentric tandem 70% stenoses near the apex.  No focally high-grade stenosis is noted.  Normal left main  Low normal LV systolic function in the 99-83% range.  Upper normal left ventricular end-diastolic pressure of 17 mmHg.  RECOMMENDATIONS:  Aspirin and Plavix for at least 6 months in the setting of chronic stable ischemic heart disease.  Given overlapping stents it would be preferable to continue dual antiplatelet therapy for 12 months.  Aggressive risk factor modification using high intensity statin therapy for as long as tolerated.  I have started atorvastatin 80 mg daily and discontinued pravastatin.  Blood pressure control with target 130/80 mmHg or less.  Phase 2 cardiac rehab.  Overnight stay since the patient lives alone, is relatively frail, has had some bleeding from the radial cath site.  CT coronary: 1. Left Main: No significant stenosis. 2. LAD: Proximal CT FFR: 0.92, mid 0.74,  distal: 0.64. 3. LCX: Proximal: 0.94, mid 0.79, distal: 0.70. 4. RCA: Proximal 0.96, mid 0.67.  IMPRESSION: 1. CT FFR showed severe stenosis in the proximal to mid RCA, mid LAD and mid LCX arteries. A cardiac catheterization is recommended.  Patient Profile     77 y.o. female who presented for heart cath 3/14 for abnormal CT coronary.  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  She has undergone nuclear stress testing in 2018 that showed no ischemia w/ septal scar and septal wall infarct. Echo demonstrated LV EF approximately 35% with septal hypokinesis.    Assessment & Plan    Abnormlal CT coronary w/ associated DOE: Underwent cardiac cath electively with repeat emergent cath and intubation overnight.  -Continue ASA -Continue Atenolol 25mg  Daily -Continue Atorvastatin 80mg  daily -Continue clopidogrel 75mg  caily -Continue NTG sublingual q5 ming PRN chest pain  Pulmonary: PCCM following. Patient extubated and stable as per chart review. Was intubated during the morning rounds. Will continue to monitor. Appreciate PCCM's assistance.   Anemia: Hgb 7.9 from ~10, given her current cardiac status a goal of >8 is recommended. Will order one unit of PRBC's to be transfused with repeat H&H following transfusion. Please notify if blood loss continues.  -CBC in am  HTN: Mildly hypertensive today. Despite acute blood loss would continue tight BP control to limit cardiac strain. Currently holding antihypertensives.   HLD: Most recent lipid profile on 08/22/2017: LDL 113, HDL 53 ratio of 3.6. -Continue  atorvastatin as above  Hypothyroidism: -Continue levothyroxine 143mcg  CKDIII: Cr 1.42 slightly above her baseline of ~1.25. -Cotninue to monitor with daily BMP's  NSCLCa:  No acute hospital needs. (s/p RULectomy 2009, radiation to LUL nodule 2013, radiation to lymphadenopathy 2015, Tarceva rx, thermal ablation of recurrent LUL adenocarcinoma 2016), -  Followed by Dr. Julien Nordmann  VTE ppx: enoxaparin GI ppx: pantoprazole 40mg  daily Constipation: bisacodyl 10mg  PRN  For questions or updates, please contact [facility] Please consult [email or web address] for contact info under Cardiology/STEMI.   Please see attending note for current A/P.  Signed, Kathi Ludwig, MD  09/22/2017, 11:11 AM   [phone]  Agree with note by Dr. Berline Lopes  Ms. Quilter had RCA intervention by Dr. Tamala Julian yesterday morning with abrupt occlusion late late yesterday afternoon requiring re-intervention with IVIS guidance. Her left system is unremarkable. She was intubated during the procedure and remains intubated followed by pulmonary critical care. Her troponins peaked at 9. She is alert and communicative and not in distress.she is on ventilatory therapy. Plans will be to wean her off her ventilator per PCCM continue medical therapy of her CAD.  Lorretta Harp, M.D., [place], [facility], [place] [address] [place], [place]  [address]  [phone] 09/22/2017 3:15 PM

## 2017-09-22 NOTE — Plan of Care (Signed)
Pain managed with cont. Fentanyl gtt. Sheath d/c ed site remains stable. Pt with adequate urine output.

## 2017-09-22 NOTE — Progress Notes (Signed)
PULMONARY / CRITICAL CARE MEDICINE   Name: [name] MRN: [id number] DOB: [birth year]    ADMISSION DATE:  09/21/2017 CONSULTATION DATE:  09/21/2017  REFERRING MD:  Dr. Tamala Julian  CHIEF COMPLAINT:  Cardiac Arrest  HISTORY OF PRESENT ILLNESS:   77 yo female former smoker with CAD and had [facility] 3/14 with stenting to RCS.  Developed abdominal pain and A fib with RVR, then altered mental status with VF.  Had ROSC after 6 minutes.  Intubated for airway protection.  She was found to have acute occlusion of mid RCA which was recanalized with angioplasty and stent. PMHx of HTN, HLD, anemia, LBBB, CKD, Hypothyroidism GERD, NSCLC (s/p RUL ectomy 2009, XRT LUL nodule 2013, XRT to lymph node 2015, tarceva, thermal ablation LUL nodule 2016), arthritis.  SUBJECTIVE:  Tolerating SBT.  VITAL SIGNS: BP (!) 151/63   Pulse 85   Temp 97.7 F (36.5 C) (Axillary)   Resp 13   Ht 5' 4.5" (1.638 m)   Wt 143 lb 15.4 oz (65.3 kg)   SpO2 96%   BMI 24.33 kg/m   VENTILATOR SETTINGS: Vent Mode: PRVC FiO2 (%):  [30 %-100 %] 30 % Set Rate:  [16 bmp-18 bmp] 18 bmp Vt Set:  [400 mL] 400 mL PEEP:  [5 cmH20] 5 cmH20 Plateau Pressure:  [12 cmH20-14 cmH20] 14 cmH20  INTAKE / OUTPUT: I/O last 3 completed shifts: In: 1158.3 [P.O.:240; I.V.:828.3; NG/GT:90] Out: 1200 [Urine:1050; Emesis/NG output:150]  PHYSICAL EXAMINATION:  General - alert Eyes - pupils reactive ENT - ETT in place Cardiac - regular, no murmur Chest - no wheeze, rales Abd - soft, non tender Ext - no edema Skin - bruising over anterior chest Neuro - normal strength  LABS:  BMET Recent Labs  Lab 09/20/17 0958  09/21/17 1642 09/21/17 2138 09/22/17 0118  NA 137  --   --  139 139  K 4.6  --   --  4.4 4.2  CL 100  --   --  109 108  CO2 31*  --   --  22 20*  BUN 25  --   --  27* 31*  CREATININE 1.23*   < > 1.34* 1.37* 1.42*  GLUCOSE 76  --   --  158* 178*   < > = values in this interval not displayed.    Electrolytes Recent  Labs  Lab 09/20/17 0958 09/21/17 2138 09/22/17 0118  CALCIUM 11.5* 8.1* 8.4*  MG  --  2.4  --   PHOS  --  6.0*  --     CBC Recent Labs  Lab 09/21/17 1023 09/21/17 1642 09/21/17 2138  WBC 12.7* 16.8* 18.6*  HGB 10.3* 10.4* 7.1*  HCT 34.1* 34.6* 23.4*  PLT 329 293 288    Coag's Recent Labs  Lab 09/20/17 0958  INR 0.9    Sepsis Markers No results for input(s): LATICACIDVEN, PROCALCITON, O2SATVEN in the last 168 hours.  ABG Recent Labs  Lab 09/21/17 2120 09/21/17 2352  PHART 7.310* 7.337*  PCO2ART 45.9 39.6  PO2ART 397.0* 171.0*    Liver Enzymes Recent Labs  Lab 09/21/17 2138  ALBUMIN 2.4*    Cardiac Enzymes Recent Labs  Lab 09/22/17 0118 09/22/17 0413  TROPONINI 7.60* 9.17*    Glucose Recent Labs  Lab 09/22/17 0734  GLUCAP 144*    Imaging Dg Abd 1 View  Result Date: 09/21/2017 CLINICAL DATA:  77 y/o  F; OG tube placement. EXAM: ABDOMEN - 1 VIEW COMPARISON:  03/07/2016 CT abdomen and pelvis  FINDINGS: Enteric tube tip projects over gastric body. Transcutaneous pacing pads noted. Upper abdomen bowel gas pattern is normal. IMPRESSION: Enteric tube tip projects over gastric body. Electronically Signed   By: Kristine Garbe M.D.   On: 09/21/2017 22:04   Portable Chest X-ray  Result Date: 09/21/2017 CLINICAL DATA:  Endotracheal tube placement. EXAM: PORTABLE CHEST 1 VIEW COMPARISON:  CT of the chest performed 09/04/2017 FINDINGS: The patient's endotracheal tube ends 2-3 cm above the carina. The tip of the tube appears to abut the right tracheal wall. This could be rotated slightly leftward. The patient's enteric tube is noted extending below the diaphragm. The patient's left lung masses are again noted. No pleural effusion or pneumothorax is seen. The cardiomediastinal silhouette is normal in size. External pacing pads are noted. No acute osseous abnormalities are identified. IMPRESSION: 1. Endotracheal tube ends 2-3 cm above the carina. Tip of  the tube appears to abut the right tracheal wall. This could be rotated slightly leftward. 2. Left lung masses again noted.  Lungs otherwise grossly clear. Electronically Signed   By: Garald Balding M.D.   On: 09/21/2017 22:03    STUDIES:  3/14 LHC >>   Segmental proximal to mid 90% RCA stenosis.  This lesion was treated with stenting using overlapping Synergy 2.5 mm stents to postdilated to 3 mm in diameter with TIMI grade III flow.  0% stenosis was noted post procedure.  Ostial 50-60% circumflex narrowing.  Luminal irregularities throughout the proximal to mid LAD.  Very distal eccentric tandem 70% stenoses near the apex.  No focally high-grade stenosis is noted.  Normal left main  Low normal LV systolic function in the 16-10% range.  Upper normal left ventricular end-diastolic pressure of 17 mmHg  3/14 LHC >>  Acute occlusion of the mid right coronary distal to the previously placed stents from earlier in the day.  Acute occlusion was accompanied by ventricular fibrillation.  The patient was successfully resuscitated.  Successful recanalization of the right coronary and placement of an additional stent overlapping the distal margin of the previously placed stent.  Final stent diameter 3.25 mm throughout the entire stented segment from distal to proximal.  3.5 mm diameter [place] balloon was used at the very proximal stent margin after intravascular ultrasound demonstrated decreased apposition.  TIMI grade III flow with no evidence of stenosis postprocedure.  Intravascular ultrasound was used and demonstrated acceptable stent apposition from distal to proximal with the exception of the proximal margin which was underexpanded.  As noted above a 3.5 balloon was used to further expand the stent  CULTURES: none  ANTIBIOTICS: none  SIGNIFICANT EVENTS: 3/14  Admitted for LHC, cardiac arrest, repeat LHC; bleeding from groin site >> aggrastat held  LINES/TUBES: ETT 3/14 >> 3/15 R femoral  sheath >>  DISCUSSION: Intact mental status.  Tolerating SBT.  Chest soreness likely from CPR.  ASSESSMENT / PLAN:  Acute hypoxic respiratory failure with compromised airway after VF arrest. - proceed with extubation 3/15  VF arrest after acute occlusion of RCA stent. CAD, HTN, HLD. - ASA, tenormin, lipitor, plavix  Acute renal failure with ATN. - f/u BMET, monitor urine outpt  Acute blood loss anemia from groin site. - no further bleeding noted - f/u CBC - transfuse for Hb < 8 or bleeding  Hx of hypothyroidism. - synthroid  Chest wall pain after CPR. - prn tylenol, fentanyl  DVT prophylaxis - lovenox SUP - protonix Nutrition - heart healthy diet after extubation Goals of care - full  code  CC time 32 minutes  Chesley Mires, MD [place] 09/22/2017, 8:58 AM Pager:  [phone] After 3pm call: [phone]

## 2017-09-22 NOTE — Progress Notes (Signed)
30 mg  Versed wasted in sink witnessed by Dorene Grebe RN.

## 2017-09-22 NOTE — Progress Notes (Signed)
Pt with 365 cc urine on bladder scan. Foley placed per MD order using sterile technique. 325 clear yellow urine retrieved from bladder. 10 cc saline injected into balloon. 2nd RN Scheryl Darter assisted.

## 2017-09-22 NOTE — Plan of Care (Signed)
  Progressing Education: Understanding of CV disease, CV risk reduction, and recovery process will improve 09/22/2017 1337 - Progressing by Jenne Campus, RN Activity: Ability to return to baseline activity level will improve 09/22/2017 1337 - Progressing by Jenne Campus, RN Cardiovascular: Ability to achieve and maintain adequate cardiovascular perfusion will improve 09/22/2017 1337 - Progressing by Jenne Campus, RN Vascular access site(s) Level 0-1 will be maintained 09/22/2017 1337 - Progressing by Jenne Campus, RN Health Behavior/Discharge Planning: Ability to safely manage health-related needs after discharge will improve 09/22/2017 1337 - Progressing by Jenne Campus, RN

## 2017-09-22 NOTE — Procedures (Signed)
Extubation Procedure Note  Patient Details:   Name: [name] DOB: Jul 23, 1940 MRN: [id number]   Airway Documentation:     Evaluation  O2 sats: stable throughout Complications: No apparent complications Patient did tolerate procedure well. Bilateral Breath Sounds: Clear   Yes   Pt extubated to 3L [place] per MD order. Pt stable throughout with no complications, VS within normal limits. PT with positive cough lost prior to extubation. Pt able to speak and has a strong productive cough post extubation. Pt encouraged to use [place] to clear secretions and Incentive Spirometer to help take a deep breath. RT will continue to monitor.   [name] 09/22/2017, 10:44 AM

## 2017-09-23 DIAGNOSIS — N183 Chronic kidney disease, stage 3 (moderate): Secondary | ICD-10-CM

## 2017-09-23 LAB — BASIC METABOLIC PANEL
Anion gap: 10 (ref 5–15)
BUN: 28 mg/dL — ABNORMAL HIGH (ref 6–20)
CALCIUM: 8.7 mg/dL — AB (ref 8.9–10.3)
CO2: 26 mmol/L (ref 22–32)
Chloride: 104 mmol/L (ref 101–111)
Creatinine, Ser: 1.13 mg/dL — ABNORMAL HIGH (ref 0.44–1.00)
GFR, EST AFRICAN AMERICAN: 53 mL/min — AB (ref >60)
GFR, EST NON AFRICAN AMERICAN: 46 mL/min — AB (ref >60)
GLUCOSE: 124 mg/dL — AB (ref 65–99)
Potassium: 3.8 mmol/L (ref 3.5–5.1)
SODIUM: 140 mmol/L (ref 135–145)

## 2017-09-23 LAB — MAGNESIUM: MAGNESIUM: 2.5 mg/dL — AB (ref 1.7–2.4)

## 2017-09-23 LAB — CBC
HCT: 25.3 % — ABNORMAL LOW (ref 36.0–46.0)
Hemoglobin: 8.1 g/dL — ABNORMAL LOW (ref 12.0–15.0)
MCH: 26.4 pg (ref 26.0–34.0)
MCHC: 32 g/dL (ref 30.0–36.0)
MCV: 82.4 fL (ref 78.0–100.0)
PLATELETS: 214 10*3/uL (ref 150–400)
RBC: 3.07 MIL/uL — ABNORMAL LOW (ref 3.87–5.11)
RDW: 18.1 % — AB (ref 11.5–15.5)
WBC: 14.4 10*3/uL — AB (ref 4.0–10.5)

## 2017-09-23 LAB — HEMOGLOBIN AND HEMATOCRIT, BLOOD
HCT: 25.2 % — ABNORMAL LOW (ref 36.0–46.0)
HEMOGLOBIN: 8.1 g/dL — AB (ref 12.0–15.0)

## 2017-09-23 LAB — BRAIN NATRIURETIC PEPTIDE: B Natriuretic Peptide: 326.8 pg/mL — ABNORMAL HIGH (ref 0.0–100.0)

## 2017-09-23 MED ORDER — FUROSEMIDE 10 MG/ML IJ SOLN: 40.0000 mg | Freq: Four times a day (QID) | INTRAMUSCULAR | Status: DC

## 2017-09-23 MED ORDER — ATENOLOL 25 MG PO TABS
50.0000 mg | ORAL_TABLET | Freq: Two times a day (BID) | ORAL | Status: DC
  Administered 2017-09-23 – 2017-09-26 (×6): 50 mg via ORAL
  Filled 2017-09-23 (×6): qty 2

## 2017-09-23 MED ORDER — BUDESONIDE 0.5 MG/2ML IN SUSP
0.5000 mg | Freq: Two times a day (BID) | RESPIRATORY_TRACT | Status: DC
  Administered 2017-09-23 – 2017-10-17 (×46): 0.5 mg via RESPIRATORY_TRACT
  Filled 2017-09-23 (×49): qty 2

## 2017-09-23 MED ORDER — ATENOLOL 25 MG PO TABS
25.0000 mg | ORAL_TABLET | Freq: Once | ORAL | Status: AC
  Administered 2017-09-23: 25 mg via ORAL
  Filled 2017-09-23: qty 1

## 2017-09-23 MED ORDER — LOSARTAN POTASSIUM 50 MG PO TABS
50.0000 mg | ORAL_TABLET | Freq: Every day | ORAL | Status: DC
  Administered 2017-09-23 – 2017-09-26 (×4): 50 mg via ORAL
  Filled 2017-09-23 (×4): qty 1

## 2017-09-23 MED ORDER — HYDROCHLOROTHIAZIDE 25 MG PO TABS: 25.0000 mg | ORAL_TABLET | Freq: Every day | ORAL | Status: DC

## 2017-09-23 MED ORDER — FUROSEMIDE 10 MG/ML IJ SOLN
20.0000 mg | Freq: Once | INTRAMUSCULAR | Status: AC
  Administered 2017-09-23: 20 mg via INTRAVENOUS
  Filled 2017-09-23: qty 2

## 2017-09-23 MED ORDER — SODIUM CHLORIDE 3 % IN NEBU
4.0000 mL | INHALATION_SOLUTION | Freq: Two times a day (BID) | RESPIRATORY_TRACT | Status: DC
  Administered 2017-09-24 – 2017-09-26 (×5): 4 mL via RESPIRATORY_TRACT
  Filled 2017-09-23 (×6): qty 4

## 2017-09-23 MED ORDER — KETOROLAC TROMETHAMINE 15 MG/ML IJ SOLN
7.5000 mg | Freq: Once | INTRAMUSCULAR | Status: AC
Start: 2017-09-23 — End: 2017-09-23
  Administered 2017-09-23: 7.5 mg via INTRAVENOUS
  Filled 2017-09-23: qty 1

## 2017-09-23 MED ORDER — WHITE PETROLATUM EX OINT
TOPICAL_OINTMENT | CUTANEOUS | Status: AC
  Filled 2017-09-23: qty 28.35

## 2017-09-23 MED ORDER — ATENOLOL 25 MG PO TABS: 25.0000 mg | ORAL_TABLET | Freq: Two times a day (BID) | ORAL | Status: DC

## 2017-09-23 MED ORDER — ARFORMOTEROL TARTRATE 15 MCG/2ML IN NEBU
15.0000 ug | INHALATION_SOLUTION | Freq: Two times a day (BID) | RESPIRATORY_TRACT | Status: DC
  Administered 2017-09-23 – 2017-10-17 (×46): 15 ug via RESPIRATORY_TRACT
  Filled 2017-09-23 (×49): qty 2

## 2017-09-23 MED ORDER — ATENOLOL 25 MG PO TABS
25.0000 mg | ORAL_TABLET | Freq: Two times a day (BID) | ORAL | Status: DC
  Administered 2017-09-23: 25 mg via ORAL
  Filled 2017-09-23: qty 1

## 2017-09-23 MED ORDER — ALBUTEROL SULFATE (2.5 MG/3ML) 0.083% IN NEBU
2.5000 mg | INHALATION_SOLUTION | RESPIRATORY_TRACT | Status: DC | PRN
  Administered 2017-09-24 (×3): 2.5 mg via RESPIRATORY_TRACT
  Filled 2017-09-23 (×3): qty 3

## 2017-09-23 NOTE — Progress Notes (Signed)
Patient received flutter valve and teaching about using the device. IS use education provided as well. Patient expressed understanding. All questions and concerns answered. Will continue to monitor and reinforce education with the devices as needed.

## 2017-09-23 NOTE — Progress Notes (Signed)
Patient up in chair for 20 minutes,  States feels slightly nauseous, sometimes gets this when takes BP meds. Up to [facility] for urination, back to bed, purewick reapplied. Coughing congested , chest slightly unstable, bruising, offered binder, or barassiere to help stableize and splint chest, patient declined at this time. Continues to work on IS and flutter valve, initiating it herself.

## 2017-09-23 NOTE — Progress Notes (Signed)
Fentanyl drip wasted. Thomas with heather BowmanRN

## 2017-09-23 NOTE — Progress Notes (Signed)
PULMONARY / CRITICAL CARE MEDICINE   Name: [name] MRN: [id number] DOB: Apr 29, 1941    ADMISSION DATE:  09/21/2017 CONSULTATION DATE:  09/21/2017  REFERRING MD:  Dr. Tamala Julian  CHIEF COMPLAINT:  Cardiac Arrest  HISTORY OF PRESENT ILLNESS:   77 yo female former smoker with CAD and had [facility] 3/14 with stenting to RCS.  Developed abdominal pain and A fib with RVR, then altered mental status with VF.  Had ROSC after 6 minutes.  Intubated for airway protection.  She was found to have acute occlusion of mid RCA which was recanalized with angioplasty and stent. PMHx of HTN, HLD, anemia, LBBB, CKD, Hypothyroidism GERD, NSCLC (s/p RUL ectomy 2009, XRT LUL nodule 2013, XRT to lymph node 2015, tarceva, thermal ablation LUL nodule 2016), arthritis.  SUBJECTIVE:  Awake and alert sitting up.  Strong cough with congestion noted.  VITAL SIGNS: BP (!) 129/51   Pulse 87   Temp 98.5 F (36.9 C) (Oral)   Resp 14   Ht 5' 4.5" (1.638 m)   Wt 65.3 kg (143 lb 15.4 oz)   SpO2 99%   BMI 24.33 kg/m   VENTILATOR SETTINGS:    INTAKE / OUTPUT: I/O last 3 completed shifts: In: 1658.3 [P.O.:120; I.V.:1073.3; Blood:315; NG/GT:150] Out: 5573 [Urine:3000; Emesis/NG output:150; Stool:1]  PHYSICAL EXAMINATION:  General: Frail thin female in no acute distress sitting up HEENT: No JVD or lymphadenopathy is appreciated PSY: Normal affect Neuro: Intact CV: Heart sounds are regular PULM: Coarse rhonchi congested cough decreased breath sounds in the bases UK:GURK, non-tender, bsx4 active  Extremities: warm/dry, 9 edema  Skin: no rashes or lesions   LABS:  BMET Recent Labs  Lab 09/21/17 2138 09/22/17 0118 09/23/17 0215  NA 139 139 140  K 4.4 4.2 3.8  CL 109 108 104  CO2 22 20* 26  BUN 27* 31* 28*  CREATININE 1.37* 1.42* 1.13*  GLUCOSE 158* 178* 124*    Electrolytes Recent Labs  Lab 09/21/17 2138 09/22/17 0118 09/23/17 0215  CALCIUM 8.1* 8.4* 8.7*  MG 2.4  --  2.5*  PHOS 6.0*  --   --      CBC Recent Labs  Lab 09/21/17 2138 09/22/17 0845 09/23/17 0215  WBC 18.6* 19.0* 14.4*  HGB 7.1* 7.9* 8.1*  8.1*  HCT 23.4* 26.1* 25.3*  25.2*  PLT 288 318 214    Coag's Recent Labs  Lab 09/20/17 0958  INR 0.9    Sepsis Markers No results for input(s): LATICACIDVEN, PROCALCITON, O2SATVEN in the last 168 hours.  ABG Recent Labs  Lab 09/21/17 2120 09/21/17 2352  PHART 7.310* 7.337*  PCO2ART 45.9 39.6  PO2ART 397.0* 171.0*    Liver Enzymes Recent Labs  Lab 09/21/17 2138  ALBUMIN 2.4*    Cardiac Enzymes Recent Labs  Lab 09/22/17 0118 09/22/17 0413 09/22/17 1237  TROPONINI 7.60* 9.17* 6.25*    Glucose Recent Labs  Lab 09/22/17 0734 09/22/17 1127 09/22/17 1637  GLUCAP 144* 125* 125*    Imaging Dg Chest Port 1 View  Result Date: 09/22/2017 CLINICAL DATA:  Dyspnea, history of lung cancer EXAM: PORTABLE CHEST 1 VIEW COMPARISON:  09/22/2017 chest radiograph. FINDINGS: Surgical clips overlie the right hilum. Pacer pads overlie the left and lower chest. Stable cardiomediastinal silhouette with top-normal heart size. No pneumothorax. No pleural effusion. Masslike left suprahilar lung opacity is stable. No pulmonary edema. No acute consolidative airspace disease. Stable deformity and sclerosis in the posterior left fourth rib. IMPRESSION: No interval change.  Stable masslike left suprahilar  lung opacity. Electronically Signed   By: Ilona Sorrel M.D.   On: 09/22/2017 20:40    STUDIES:  3/14 LHC >>   Segmental proximal to mid 90% RCA stenosis.  This lesion was treated with stenting using overlapping Synergy 2.5 mm stents to postdilated to 3 mm in diameter with TIMI grade III flow.  0% stenosis was noted post procedure.  Ostial 50-60% circumflex narrowing.  Luminal irregularities throughout the proximal to mid LAD.  Very distal eccentric tandem 70% stenoses near the apex.  No focally high-grade stenosis is noted.  Normal left main  Low normal LV  systolic function in the 49-44% range.  Upper normal left ventricular end-diastolic pressure of 17 mmHg  3/14 LHC >>  Acute occlusion of the mid right coronary distal to the previously placed stents from earlier in the day.  Acute occlusion was accompanied by ventricular fibrillation.  The patient was successfully resuscitated.  Successful recanalization of the right coronary and placement of an additional stent overlapping the distal margin of the previously placed stent.  Final stent diameter 3.25 mm throughout the entire stented segment from distal to proximal.  3.5 mm diameter Bettendorf balloon was used at the very proximal stent margin after intravascular ultrasound demonstrated decreased apposition.  TIMI grade III flow with no evidence of stenosis postprocedure.  Intravascular ultrasound was used and demonstrated acceptable stent apposition from distal to proximal with the exception of the proximal margin which was underexpanded.  As noted above a 3.5 balloon was used to further expand the stent  CULTURES: none  ANTIBIOTICS: none  SIGNIFICANT EVENTS: 3/14  Admitted for LHC, cardiac arrest, repeat LHC; bleeding from groin site >> aggrastat held 09/22/2017 extubated without problem  LINES/TUBES: ETT 3/14 >> 3/15 R femoral sheath >> out  DISCUSSION: Awake alert no acute distress.  Congested post extubation but otherwise stable.  She is on cardiology service I suspect she can go to stepdown unit if they so desire.  ASSESSMENT / PLAN:  Acute hypoxic respiratory failure with compromised airway after VF arrest. -Extubated 09/22/2017 -Congestion post extubation will order flutter valve, incentive spirometer, along with bronchodilators.  Suspect it is more she is mobilize this will improved  VF arrest after acute occlusion of RCA stent. CAD, HTN, HLD. -Aspirin Lipitor and Plavix -Per cardiology  Acute renal failure with ATN. Lab Results  Component Value Date   CREATININE 1.13 (H)  09/23/2017   CREATININE 1.42 (H) 09/22/2017   CREATININE 1.37 (H) 09/21/2017   CREATININE 1.3 (H) 05/29/2017   CREATININE 1.2 (H) 03/01/2017   CREATININE 1.5 (H) 11/30/2016   Recent Labs  Lab 09/21/17 2138 09/22/17 0118 09/23/17 0215  K 4.4 4.2 3.8    -Replete electrolytes as needed -Avoid nephrotoxins -Monitor electrolytes  Acute blood loss anemia from groin site. - no further bleeding noted - f/u CBC - transfuse for Hb < 8 or bleeding  Hx of hypothyroidism. -Continue Synthroid  Chest wall pain after CPR. -PRN pain medication  DVT prophylaxis - lovenox SUP - protonix Nutrition - heart healthy diet after extubation Goals of care - full code  Critical care time 30 minutes per NP   Richardson Landry Minor ACNP Maryanna Shape PCCM Pager [phone] till 1 pm If no answer page [phone]- [phone] 09/23/2017, 8:44 AM

## 2017-09-23 NOTE — Progress Notes (Addendum)
Progress Note  Patient Name: [name] Date of Encounter: 09/23/2017  Primary Cardiologist: No primary care provider on file.   Subjective   Feeling well.  CP with cough and breathing.  Cough is non-productive.    Inpatient Medications    Scheduled Meds: . aspirin  81 mg Oral Daily  . atenolol  25 mg Oral Daily  . atorvastatin  80 mg Oral q1800  . calcium-vitamin D  2 tablet Oral Daily  . clopidogrel  75 mg Oral Q breakfast  . enoxaparin (LOVENOX) injection  40 mg Subcutaneous Q24H  . ferrous sulfate  325 mg Oral Q breakfast  . gabapentin  600 mg Oral QHS  . levothyroxine  100 mcg Oral QAC breakfast  . mouth rinse  15 mL Mouth Rinse BID  . multivitamin with minerals  1 tablet Oral Daily  . pantoprazole  40 mg Oral Q1200  . sodium chloride flush  3 mL Intravenous Q12H  . sodium chloride flush  3 mL Intravenous Q12H   Continuous Infusions: . sodium chloride Stopped (09/21/17 2100)  . sodium chloride     PRN Meds: sodium chloride, acetaminophen, alum & mag hydroxide-simeth, bisacodyl, fentaNYL (SUBLIMAZE) injection, nitroGLYCERIN, ondansetron (ZOFRAN) IV, sodium chloride flush, sodium chloride flush   Vital Signs    Vitals:   09/23/17 0400 09/23/17 0500 09/23/17 0600 09/23/17 0732  BP: (!) 128/103 128/64 (!) 129/51   Pulse:      Resp: 18 14 14    Temp:    98.5 F (36.9 C)  TempSrc:    Oral  SpO2: 97% 99%    Weight:      Height:        Intake/Output Summary (Last 24 hours) at 09/23/2017 0808 Last data filed at 09/23/2017 0600 Gross per 24 hour  Intake 438 ml  Output 2101 ml  Net -1663 ml   Filed Weights   09/21/17 0608 09/22/17 0500  Weight: 140 lb (63.5 kg) 143 lb 15.4 oz (65.3 kg)    Telemetry    Sinus rhythm.  PVCs.  - Personally Reviewed  ECG    09/22/17: Sinus tachycardia.  Rate 108 bpm.  LBBB.  LAD.   - Personally Reviewed  Physical Exam    VS:  BP (!) 129/51   Pulse 87   Temp 98.5 F (36.9 C) (Oral)   Resp 14   Ht 5' 4.5" (1.638 m)    Wt 143 lb 15.4 oz (65.3 kg)   SpO2 99%   BMI 24.33 kg/m  , BMI Body mass index is 24.33 kg/m. GENERAL:  Ill-appearing HEENT: Pupils equal round and reactive, fundi not visualized, oral mucosa unremarkable NECK:  No jugular venous distention, waveform within normal limits, carotid upstroke brisk and symmetric, no bruits LUNGS:  Diffuse rhonchi.  No wheezes or crackles HEART:  RRR.  PMI not displaced or sustained,S1 and S2 within normal limits, no S3, no S4, no clicks, no rubs, no murmurs ABD:  Flat, positive bowel sounds normal in frequency in pitch, no bruits, no rebound, no guarding, no midline pulsatile mass, no hepatomegaly, no splenomegaly EXT:  2 plus pulses throughout, no edema, no cyanosis no clubbing SKIN:  No rashes no nodules NEURO:  Cranial nerves II through XII grossly intact, motor grossly intact throughout PSYCH:  Cognitively intact, oriented to person place and time   Labs    Chemistry Recent Labs  Lab 09/21/17 2138 09/22/17 0118 09/23/17 0215  NA 139 139 140  K 4.4 4.2 3.8  CL  109 108 104  CO2 22 20* 26  GLUCOSE 158* 178* 124*  BUN 27* 31* 28*  CREATININE 1.37* 1.42* 1.13*  CALCIUM 8.1* 8.4* 8.7*  ALBUMIN 2.4*  --   --   GFRNONAA 36* 35* 46*  GFRAA 42* 40* 53*  ANIONGAP 8 11 10      Hematology Recent Labs  Lab 09/21/17 2138 09/22/17 0845 09/23/17 0215  WBC 18.6* 19.0* 14.4*  RBC 2.70* 2.99* 3.07*  HGB 7.1* 7.9* 8.1*  8.1*  HCT 23.4* 26.1* 25.3*  25.2*  MCV 86.7 87.3 82.4  MCH 26.3 26.4 26.4  MCHC 30.3 30.3 32.0  RDW 15.8* 16.1* 18.1*  PLT 288 318 214    Cardiac Enzymes Recent Labs  Lab 09/22/17 0118 09/22/17 0413 09/22/17 1237  TROPONINI 7.60* 9.17* 6.25*   No results for input(s): TROPIPOC in the last 168 hours.   BNP Recent Labs  Lab 09/23/17 0215  BNP 326.8*     DDimer No results for input(s): DDIMER in the last 168 hours.   Radiology    Dg Abd 1 View  Result Date: 09/21/2017 CLINICAL DATA:  77 y/o  F; OG tube  placement. EXAM: ABDOMEN - 1 VIEW COMPARISON:  03/07/2016 CT abdomen and pelvis FINDINGS: Enteric tube tip projects over gastric body. Transcutaneous pacing pads noted. Upper abdomen bowel gas pattern is normal. IMPRESSION: Enteric tube tip projects over gastric body. Electronically Signed   By: Kristine Garbe M.D.   On: 09/21/2017 22:04   Dg Chest Port 1 View  Result Date: 09/22/2017 CLINICAL DATA:  Dyspnea, history of lung cancer EXAM: PORTABLE CHEST 1 VIEW COMPARISON:  09/22/2017 chest radiograph. FINDINGS: Surgical clips overlie the right hilum. Pacer pads overlie the left and lower chest. Stable cardiomediastinal silhouette with top-normal heart size. No pneumothorax. No pleural effusion. Masslike left suprahilar lung opacity is stable. No pulmonary edema. No acute consolidative airspace disease. Stable deformity and sclerosis in the posterior left fourth rib. IMPRESSION: No interval change.  Stable masslike left suprahilar lung opacity. Electronically Signed   By: Ilona Sorrel M.D.   On: 09/22/2017 20:40   Portable Chest Xray  Result Date: 09/22/2017 CLINICAL DATA:  Cardiac arrest. EXAM: PORTABLE CHEST 1 VIEW COMPARISON:  Chest x-rays dated 09/21/2017 and 06/21/2016 FINDINGS: Endotracheal tube appears in good position. NG tube tip is below the diaphragm. Heart size and vascularity are normal. Aortic atherosclerosis. Stable left lung mass at the superior aspect of the left hilum. The lungs are otherwise clear. IMPRESSION: Stable appearance of the chest. Electronically Signed   By: Lorriane Shire M.D.   On: 09/22/2017 09:01   Portable Chest X-ray  Result Date: 09/21/2017 CLINICAL DATA:  Endotracheal tube placement. EXAM: PORTABLE CHEST 1 VIEW COMPARISON:  CT of the chest performed 09/04/2017 FINDINGS: The patient's endotracheal tube ends 2-3 cm above the carina. The tip of the tube appears to abut the right tracheal wall. This could be rotated slightly leftward. The patient's enteric tube  is noted extending below the diaphragm. The patient's left lung masses are again noted. No pleural effusion or pneumothorax is seen. The cardiomediastinal silhouette is normal in size. External pacing pads are noted. No acute osseous abnormalities are identified. IMPRESSION: 1. Endotracheal tube ends 2-3 cm above the carina. Tip of the tube appears to abut the right tracheal wall. This could be rotated slightly leftward. 2. Left lung masses again noted.  Lungs otherwise grossly clear. Electronically Signed   By: Garald Balding M.D.   On: 09/21/2017 22:03  Cardiac Studies   Cardiac cath 09/22/2017:  Acute occlusion of the mid right coronary distal to the previously placed stents from earlier in the day. Acute occlusion was accompanied by ventricular fibrillation. The patient was successfully resuscitated.  Successful recanalization of the right coronary and placement of an additional stent overlapping the distal margin of the previously placed stent. Final stent diameter 3.25 mm throughout the entire stented segment from distal to proximal. 3.5 mm diameter  balloon was used at the very proximal stent margin after intravascular ultrasound demonstrated decreased apposition. TIMI grade III flow with no evidence of stenosis postprocedure.  Intravascular ultrasound was used and demonstrated acceptable stent apposition from distal to proximal with the exception of the proximal margin which was underexpanded. As noted above a 3.5 balloon was used to further expand the stent.  RECOMMENDATION:  Aggrastat for 10 hours.  Continue Plavix.  Critical care medicine to manage the patient's ventilator.  Continue IV Versed and fentanyl for sedation while on the ventilator.  Discussed the situation with family. Do not anticipate discharge for several days.  IV hydration and tract kidney function.  Cardiac cath 09/21/2017:   Segmental proximal to mid 90% RCA stenosis. This lesion was treated  with stenting using overlapping Synergy 2.5 mm stents to postdilated to 3 mm in diameter with TIMI grade III flow. 0% stenosis was noted post procedure.  Ostial 50-60% circumflex narrowing.  Luminal irregularities throughout the proximal to mid LAD. Very distal eccentric tandem 70% stenoses near the apex. No focally high-grade stenosis is noted.  Normal left main  Low normal LV systolic function in the 83-15% range. Upper normal left ventricular end-diastolic pressure of 17 mmHg.  RECOMMENDATIONS:  Aspirin and Plavix for at least 6 months in the setting of chronic stable ischemic heart disease. Given overlapping stents it would be preferable to continue dual antiplatelet therapy for 12 months.  Aggressive risk factor modification using high intensity statin therapy for as long as tolerated. I have started atorvastatin 80 mg daily and discontinued pravastatin. Blood pressure control with target 130/80 mmHg or less.  Phase 2 cardiac rehab.  Overnight stay since the patient lives alone, is relatively frail, has had some bleeding from the radial cath site.  CT coronary: 1. Left Main: No significant stenosis. 2. LAD: Proximal CT FFR: 0.92, mid 0.74, distal: 0.64. 3. LCX: Proximal: 0.94, mid 0.79, distal: 0.70. 4. RCA: Proximal 0.96, mid 0.67.  IMPRESSION: 1. CT FFR showed severe stenosis in the proximal to mid RCA, mid LAD and mid LCX arteries. A cardiac catheterization is recommended.     Patient Profile     77 y.o. female with abnormal coronary CT, CKD III, mild carotid stenosis, hypothyroidism, GERD, NSCSL s/p R upper lobectomy/XRT who presented for cardiac catheterization 3/14 and had a DES placed in the RCA.  She developed acute stent occlusion and required repeat cath and intubation that day.    Assessment & Plan    # CAD s/p PCI and acute stent thrombosis:  # VF arrest:  Is she had 90% RCA disease treated with PCI. This occluded acutely 2/2 distal edge  dissection.  This was a complicated by ventricular fibrillation.  She underwent repeat PCI 09/21/17 and the RCA region was recanalized.  Will repeat echo prior to discharge.  She has chest wall pain so not today.  Continue aspirin, clopidogrel, and atorvastatin.  Resume home atenolol.   # Hypertension:  Home BP meds have been held and her BP is rising. Will resume atenolol  25mg  bid and HCTZ.  # Anemia:  Acute blood loss from groin.  She was trasfused this admission.  Goal >8. Continue iron supplement.   Hyperlipidemia:  LDL goal <70.  Was 113 on 08/2017.  Atorvastatin was started this admission.  Will need lipids and CMP in 6-8 weeks after discharge.   # CKD III:  Creatinine improving.  1.13 today from 1.4 yesterday.  # [place] lung cancer:  She underwent RUL lobectomy in 2009, XRT to LUL nodule 2013, XRT for lymphadenopathy [id number], Tarceva, and thermal ablation for recurrent disease in 2016.  Not an active issue.  Time spent: 45 minutes-Greater than 50% of this time was spent in counseling, explanation of diagnosis, planning of further management, and coordination of care.   For questions or updates, please contact [place] Please consult [email or web address] for contact info under Cardiology/STEMI.      Signed, Skeet Latch, MD  09/23/2017, 8:08 AM

## 2017-09-24 ENCOUNTER — Inpatient Hospital Stay (HOSPITAL_COMMUNITY): Payer: Medicare Other

## 2017-09-24 ENCOUNTER — Other Ambulatory Visit (HOSPITAL_COMMUNITY): Payer: Medicare Other

## 2017-09-24 DIAGNOSIS — J432 Centrilobular emphysema: Secondary | ICD-10-CM

## 2017-09-24 LAB — BASIC METABOLIC PANEL
Anion gap: 8 (ref 5–15)
BUN: 24 mg/dL — AB (ref 6–20)
CO2: 28 mmol/L (ref 22–32)
Calcium: 9.2 mg/dL (ref 8.9–10.3)
Chloride: 104 mmol/L (ref 101–111)
Creatinine, Ser: 0.94 mg/dL (ref 0.44–1.00)
GFR calc Af Amer: >60 mL/min (ref >60)
GFR, EST NON AFRICAN AMERICAN: 57 mL/min — AB (ref >60)
GLUCOSE: 111 mg/dL — AB (ref 65–99)
POTASSIUM: 3.8 mmol/L (ref 3.5–5.1)
SODIUM: 140 mmol/L (ref 135–145)

## 2017-09-24 LAB — CBC WITH DIFFERENTIAL/PLATELET
BASOS ABS: 0 10*3/uL (ref 0.0–0.1)
Basophils Relative: 0 %
Eosinophils Absolute: 0.2 10*3/uL (ref 0.0–0.7)
Eosinophils Relative: 2 %
HCT: 23.6 % — ABNORMAL LOW (ref 36.0–46.0)
Hemoglobin: 7.5 g/dL — ABNORMAL LOW (ref 12.0–15.0)
LYMPHS ABS: 2.4 10*3/uL (ref 0.7–4.0)
Lymphocytes Relative: 20 %
MCH: 26.8 pg (ref 26.0–34.0)
MCHC: 31.8 g/dL (ref 30.0–36.0)
MCV: 84.3 fL (ref 78.0–100.0)
MONO ABS: 2 10*3/uL — AB (ref 0.1–1.0)
MONOS PCT: 16 %
NEUTROS ABS: 7.6 10*3/uL (ref 1.7–7.7)
Neutrophils Relative %: 62 %
PLATELETS: 188 10*3/uL (ref 150–400)
RBC: 2.8 MIL/uL — AB (ref 3.87–5.11)
RDW: 17.9 % — AB (ref 11.5–15.5)
WBC: 12.2 10*3/uL — AB (ref 4.0–10.5)

## 2017-09-24 LAB — MAGNESIUM: MAGNESIUM: 2.2 mg/dL (ref 1.7–2.4)

## 2017-09-24 LAB — PHOSPHORUS: PHOSPHORUS: 2.1 mg/dL — AB (ref 2.5–4.6)

## 2017-09-24 MED ORDER — ACETAMINOPHEN 325 MG PO TABS
650.0000 mg | ORAL_TABLET | Freq: Three times a day (TID) | ORAL | Status: DC
  Administered 2017-09-24 – 2017-09-26 (×6): 650 mg via ORAL
  Filled 2017-09-24 (×6): qty 2

## 2017-09-24 MED ORDER — HYDROCHLOROTHIAZIDE 12.5 MG PO CAPS
12.5000 mg | ORAL_CAPSULE | Freq: Every day | ORAL | Status: DC
  Administered 2017-09-24 – 2017-09-26 (×3): 12.5 mg via ORAL
  Filled 2017-09-24 (×3): qty 1

## 2017-09-24 MED ORDER — LIDOCAINE 5 % EX PTCH
1.0000 | MEDICATED_PATCH | CUTANEOUS | Status: DC
  Administered 2017-09-24 – 2017-10-17 (×23): 1 via TRANSDERMAL
  Filled 2017-09-24 (×25): qty 1

## 2017-09-24 NOTE — Progress Notes (Signed)
Progress Note  Patient Name: [name] Date of Encounter: 09/24/2017  Primary Cardiologist: No primary care provider on file.   Subjective   Feeling OK.  Had a rough night with shortness of breath and cough.  Improved with breathing treatments.  Wants to be DNR.  Inpatient Medications    Scheduled Meds: . arformoterol  15 mcg Nebulization BID  . aspirin  81 mg Oral Daily  . atenolol  50 mg Oral BID  . atorvastatin  80 mg Oral q1800  . budesonide (PULMICORT) nebulizer solution  0.5 mg Nebulization BID  . calcium-vitamin D  2 tablet Oral Daily  . clopidogrel  75 mg Oral Q breakfast  . enoxaparin (LOVENOX) injection  40 mg Subcutaneous Q24H  . ferrous sulfate  325 mg Oral Q breakfast  . gabapentin  600 mg Oral QHS  . hydrochlorothiazide  12.5 mg Oral Daily  . levothyroxine  100 mcg Oral QAC breakfast  . losartan  50 mg Oral Daily  . mouth rinse  15 mL Mouth Rinse BID  . multivitamin with minerals  1 tablet Oral Daily  . pantoprazole  40 mg Oral Q1200  . sodium chloride flush  3 mL Intravenous Q12H  . sodium chloride flush  3 mL Intravenous Q12H  . sodium chloride HYPERTONIC  4 mL Nebulization BID   Continuous Infusions: . sodium chloride Stopped (09/21/17 2100)  . sodium chloride     PRN Meds: sodium chloride, acetaminophen, albuterol, alum & mag hydroxide-simeth, bisacodyl, fentaNYL (SUBLIMAZE) injection, nitroGLYCERIN, ondansetron (ZOFRAN) IV, sodium chloride flush, sodium chloride flush   Vital Signs    Vitals:   09/24/17 0700 09/24/17 0753 09/24/17 0800 09/24/17 0900  BP: (!) 151/62  (!) 164/73 (!) 172/70  Pulse:      Resp: 17  13 (!) 23  Temp:   97.6 F (36.4 C)   TempSrc:   Oral   SpO2: 100% 100% 100% 100%  Weight:      Height:        Intake/Output Summary (Last 24 hours) at 09/24/2017 0940 Last data filed at 09/24/2017 0400 Gross per 24 hour  Intake -  Output 1050 ml  Net -1050 ml   Filed Weights   09/21/17 0608 09/22/17 0500  Weight: 140 lb  (63.5 kg) 143 lb 15.4 oz (65.3 kg)    Telemetry    Sinus rhythm.  PAT, PVCs.  - Personally Reviewed  ECG    09/22/17: Sinus tachycardia.  Rate 108 bpm.  LBBB.  LAD.   - Personally Reviewed  Physical Exam    VS:  BP (!) 172/70   Pulse (!) 102   Temp 97.6 F (36.4 C) (Oral)   Resp (!) 23   Ht 5' 4.5" (1.638 m)   Wt 143 lb 15.4 oz (65.3 kg)   SpO2 100%   BMI 24.33 kg/m  , BMI Body mass index is 24.33 kg/m. GENERAL:  Ill-appearing HEENT: Pupils equal round and reactive, fundi not visualized, oral mucosa unremarkable NECK:  No jugular venous distention, waveform within normal limits, carotid upstroke brisk and symmetric, no bruits LUNGS: No rhonchi, wheezes or crackles HEART:  RRR.  PMI not displaced or sustained,S1 and S2 within normal limits, no S3, no S4, no clicks, no rubs, no murmurs ABD:  Flat, positive bowel sounds normal in frequency in pitch, no bruits, no rebound, no guarding, no midline pulsatile mass, no hepatomegaly, no splenomegaly EXT:  2 plus pulses throughout, no edema, no cyanosis no clubbing SKIN:  No rashes no nodules NEURO:  Cranial nerves II through XII grossly intact, motor grossly intact throughout [facility]:  Cognitively intact, oriented to person place and time   Labs    Chemistry Recent Labs  Lab 09/21/17 2138 09/22/17 0118 09/23/17 0215 09/24/17 0221  NA 139 139 140 140  K 4.4 4.2 3.8 3.8  CL 109 108 104 104  CO2 22 20* 26 28  GLUCOSE 158* 178* 124* 111*  BUN 27* 31* 28* 24*  CREATININE 1.37* 1.42* 1.13* 0.94  CALCIUM 8.1* 8.4* 8.7* 9.2  ALBUMIN 2.4*  --   --   --   GFRNONAA 36* 35* 46* 57*  GFRAA 42* 40* 53* >60  ANIONGAP 8 11 10 8      Hematology Recent Labs  Lab 09/22/17 0845 09/23/17 0215 09/24/17 0221  WBC 19.0* 14.4* 12.2*  RBC 2.99* 3.07* 2.80*  HGB 7.9* 8.1*  8.1* 7.5*  HCT 26.1* 25.3*  25.2* 23.6*  MCV 87.3 82.4 84.3  MCH 26.4 26.4 26.8  MCHC 30.3 32.0 31.8  RDW 16.1* 18.1* 17.9*  PLT 318 214 188    Cardiac  Enzymes Recent Labs  Lab 09/22/17 0118 09/22/17 0413 09/22/17 1237  TROPONINI 7.60* 9.17* 6.25*   No results for input(s): TROPIPOC in the last 168 hours.   BNP Recent Labs  Lab 09/23/17 0215  BNP 326.8*     DDimer No results for input(s): DDIMER in the last 168 hours.   Radiology    Dg Chest Port 1 View  Result Date: 09/24/2017 CLINICAL DATA:  Respiratory failure EXAM: PORTABLE CHEST 1 VIEW COMPARISON:  Chest radiograph 09/22/2017 FINDINGS: Monitoring leads overlie the patient. Stable cardiac and mediastinal contours. Aortic atherosclerosis. Re demonstrated left suprahilar masslike opacity. No pleural effusion or pneumothorax. Stable posterior left fourth rib deformity. IMPRESSION: Stable chest radiograph.  Persistent masslike suprahilar opacity. Electronically Signed   By: Lovey Newcomer M.D.   On: 09/24/2017 07:57   Dg Chest Port 1 View  Result Date: 09/22/2017 CLINICAL DATA:  Dyspnea, history of lung cancer EXAM: PORTABLE CHEST 1 VIEW COMPARISON:  09/22/2017 chest radiograph. FINDINGS: Surgical clips overlie the right hilum. Pacer pads overlie the left and lower chest. Stable cardiomediastinal silhouette with top-normal heart size. No pneumothorax. No pleural effusion. Masslike left suprahilar lung opacity is stable. No pulmonary edema. No acute consolidative airspace disease. Stable deformity and sclerosis in the posterior left fourth rib. IMPRESSION: No interval change.  Stable masslike left suprahilar lung opacity. Electronically Signed   By: Ilona Sorrel M.D.   On: 09/22/2017 20:40    Cardiac Studies   Cardiac cath 09/22/2017:  Acute occlusion of the mid right coronary distal to the previously placed stents from earlier in the day. Acute occlusion was accompanied by ventricular fibrillation. The patient was successfully resuscitated.  Successful recanalization of the right coronary and placement of an additional stent overlapping the distal margin of the previously placed  stent. Final stent diameter 3.25 mm throughout the entire stented segment from distal to proximal. 3.5 mm diameter [place] balloon was used at the very proximal stent margin after intravascular ultrasound demonstrated decreased apposition. TIMI grade III flow with no evidence of stenosis postprocedure.  Intravascular ultrasound was used and demonstrated acceptable stent apposition from distal to proximal with the exception of the proximal margin which was underexpanded. As noted above a 3.5 balloon was used to further expand the stent.  RECOMMENDATION:  Aggrastat for 10 hours.  Continue Plavix.  Critical care medicine to manage the patient's ventilator.  Continue IV Versed and fentanyl for sedation while on the ventilator.  Discussed the situation with family. Do not anticipate discharge for several days.  IV hydration and tract kidney function.  Cardiac cath 09/21/2017:   Segmental proximal to mid 90% RCA stenosis. This lesion was treated with stenting using overlapping Synergy 2.5 mm stents to postdilated to 3 mm in diameter with TIMI grade III flow. 0% stenosis was noted post procedure.  Ostial 50-60% circumflex narrowing.  Luminal irregularities throughout the proximal to mid LAD. Very distal eccentric tandem 70% stenoses near the apex. No focally high-grade stenosis is noted.  Normal left main  Low normal LV systolic function in the 92-42% range. Upper normal left ventricular end-diastolic pressure of 17 mmHg.  RECOMMENDATIONS:  Aspirin and Plavix for at least 6 months in the setting of chronic stable ischemic heart disease. Given overlapping stents it would be preferable to continue dual antiplatelet therapy for 12 months.  Aggressive risk factor modification using high intensity statin therapy for as long as tolerated. I have started atorvastatin 80 mg daily and discontinued pravastatin. Blood pressure control with target 130/80 mmHg or less.  Phase 2 cardiac  rehab.  Overnight stay since the patient lives alone, is relatively frail, has had some bleeding from the radial cath site.  CT coronary: 1. Left Main: No significant stenosis. 2. LAD: Proximal CT FFR: 0.92, mid 0.74, distal: 0.64. 3. LCX: Proximal: 0.94, mid 0.79, distal: 0.70. 4. RCA: Proximal 0.96, mid 0.67.  IMPRESSION: 1. CT FFR showed severe stenosis in the proximal to mid RCA, mid LAD and mid LCX arteries. A cardiac catheterization is recommended.     Patient Profile     77 y.o. female with abnormal coronary CT, CKD III, mild carotid stenosis, hypothyroidism, GERD, NSCSL s/p R upper lobectomy/XRT who presented for cardiac catheterization 3/14 and had a DES placed in the RCA.  She developed acute stent occlusion and required repeat cath and intubation that day.    Assessment & Plan    # CAD s/p PCI and acute stent thrombosis:  # VF arrest:  Is she had 90% RCA disease treated with PCI. This occluded acutely 2/2 distal edge dissection.  This was a complicated by ventricular fibrillation.  She underwent repeat PCI 09/21/17 and the RCA region was recanalized.  Will repeat echo prior to discharge once her chest wall pain is better controlled.  Apply lidocaine patches and schedule aceteminophen.  Continue aspirin, atenolol, clopidogrel, and atorvastatin.    # Hypertension:  Atenolol was increased and losartan was added 3/16.  Will add back her home HCTZ.  # Anemia:  Acute blood loss from groin.  She was trasfused this admission.  Goal >8. Continue iron supplement.   Hyperlipidemia:  LDL goal <70.  Was 113 on 08/2017.  Atorvastatin was started this admission.  Will need lipids and CMP in 6-8 weeks after discharge.   # CKD III:  Creatinine improving.  Down to 0.94.   # [place] lung cancer:  She underwent RUL lobectomy in 2009, XRT to LUL nodule 2013, XRT for lymphadenopathy [id number], Tarce[place], and thermal ablation for recurrent disease in 2016.  Not an active issue.    For questions or  updates, please contact [place] Please consult [email or web address] for contact info under Cardiology/STEMI.      Signed, Skeet Latch, MD  09/24/2017, 9:40 AM

## 2017-09-24 NOTE — Progress Notes (Signed)
Pt expressing wishes to change code status to DNR, she "no longer wants to keep suffering if something else happens". Dr. Kirby Crigler paged and updated on patient request to change code status to full DNR. Advised RN to pass along to primary physician during the day, no new orders received at this time.

## 2017-09-24 NOTE — Progress Notes (Signed)
Increase in dry coughing and shortness of breath, JVD noted. Called Dr [place] Bells CCM with findings. Urine output today 400+ one incontinent episode.

## 2017-09-24 NOTE — Progress Notes (Signed)
Patient medicated for pain, asking about hospice and pallative care.  Discussed talking to primary tomorrow about consult.  Bath given to patient. Resting afterwards. Relief with fentanyl.

## 2017-09-24 NOTE — Progress Notes (Signed)
[facility] PCCM  S: had a rough night, breathing is better this morning, she says she doesn't ever want to go through CPR or life support again  O:  Vitals:   09/24/17 0700 09/24/17 0753 09/24/17 0800 09/24/17 0900  BP: (!) 151/62  (!) 164/73 (!) 172/70  Pulse:      Resp: 17  13 (!) 23  Temp:   97.6 F (36.4 C)   TempSrc:   Oral   SpO2: 100% 100% 100% 100%  Weight:      Height:       3L [place]  General:  Resting comfortably in bed HENT: NCAT OP clear PULM: Some wheezing, flail chest noted, normal effort CV: RRR, no mgr GI: BS+, soft, nontender MSK: normal bulk and tone Neuro: awake, alert, no distress, MAEW  Chest x-ray images reviewed from today showing questionable left upper lobe atelectasis versus infiltrate but otherwise clear, emphysema noted  Impression: Cardiac arrest Coronary artery disease COPD Flail chest Dyspnea Mild chest pain  Plan: Continue splinting with cough Up out of bed as tolerated Continue Brovana and Pulmicort Continue hypertonic saline and chest PT as needed for mucociliary clearance  I have changed her CODE STATUS to DNR per the patient's request.  She does indicate that she still wants full medical support she does never wants to go through CPR or life support again.  She was very clear about this to me in conversation today.  I have encouraged her to talk to her other healthcare providers about this and to discuss with her family.  Roselie Awkward, MD [place] PCCM Pager: [phone] Cell: [phone] After 3pm or if no response, call [phone]

## 2017-09-24 NOTE — Progress Notes (Signed)
CCM into see, discussed code status with apteint. Orders to be received. Son in , pateint speaking to him regarding her wishes. Discussed difference of DNR versus treatment. Patient wishes for reasonable treatment, but in event of arrest, does not want CPR or heroic measures.

## 2017-09-24 NOTE — Plan of Care (Signed)
Patient spoke with MD regarding wish for DNR. DNR orders written.  Expectorating sputum now with treatment modalities.   Transfer to  [facility] orders written.  Continue with pain control, tylenol, torodol, and breathing exercises. Good family support.  Monitor blood pressure

## 2017-09-25 DIAGNOSIS — J9811 Atelectasis: Secondary | ICD-10-CM

## 2017-09-25 DIAGNOSIS — S225XXA Flail chest, initial encounter for closed fracture: Secondary | ICD-10-CM

## 2017-09-25 DIAGNOSIS — J9809 Other diseases of bronchus, not elsewhere classified: Secondary | ICD-10-CM

## 2017-09-25 DIAGNOSIS — J449 Chronic obstructive pulmonary disease, unspecified: Secondary | ICD-10-CM

## 2017-09-25 LAB — BASIC METABOLIC PANEL
Anion gap: 9 (ref 5–15)
BUN: 15 mg/dL (ref 6–20)
CO2: 29 mmol/L (ref 22–32)
CREATININE: 0.82 mg/dL (ref 0.44–1.00)
Calcium: 9.1 mg/dL (ref 8.9–10.3)
Chloride: 100 mmol/L — ABNORMAL LOW (ref 101–111)
GFR calc non Af Amer: >60 mL/min (ref >60)
Glucose, Bld: 111 mg/dL — ABNORMAL HIGH (ref 65–99)
Potassium: 3.4 mmol/L — ABNORMAL LOW (ref 3.5–5.1)
SODIUM: 138 mmol/L (ref 135–145)

## 2017-09-25 LAB — PREPARE RBC (CROSSMATCH)

## 2017-09-25 LAB — HEMOGLOBIN AND HEMATOCRIT, BLOOD
HCT: 28.2 % — ABNORMAL LOW (ref 36.0–46.0)
Hemoglobin: 9.1 g/dL — ABNORMAL LOW (ref 12.0–15.0)

## 2017-09-25 LAB — POCT ACTIVATED CLOTTING TIME: ACTIVATED CLOTTING TIME: 461 s

## 2017-09-25 MED ORDER — SODIUM CHLORIDE 0.9 % IV SOLN
Freq: Once | INTRAVENOUS | Status: AC
  Administered 2017-09-25: 14:00:00 via INTRAVENOUS

## 2017-09-25 MED ORDER — HYDRALAZINE HCL 20 MG/ML IJ SOLN
INTRAMUSCULAR | Status: AC
  Filled 2017-09-25: qty 1

## 2017-09-25 MED ORDER — HYDRALAZINE HCL 20 MG/ML IJ SOLN
10.0000 mg | Freq: Four times a day (QID) | INTRAMUSCULAR | Status: DC | PRN
  Administered 2017-09-25 – 2017-10-03 (×6): 10 mg via INTRAVENOUS
  Filled 2017-09-25 (×4): qty 1

## 2017-09-25 MED ORDER — POTASSIUM CHLORIDE ER 10 MEQ PO TBCR
40.0000 meq | EXTENDED_RELEASE_TABLET | Freq: Two times a day (BID) | ORAL | Status: AC
  Administered 2017-09-25 (×2): 40 meq via ORAL
  Filled 2017-09-25 (×4): qty 4

## 2017-09-25 MED ORDER — ZOLPIDEM TARTRATE 5 MG PO TABS
5.0000 mg | ORAL_TABLET | Freq: Every day | ORAL | Status: AC
  Administered 2017-09-25 – 2017-09-26 (×2): 5 mg via ORAL
  Filled 2017-09-25 (×2): qty 1

## 2017-09-25 MED ORDER — ONDANSETRON HCL 4 MG/2ML IJ SOLN
4.0000 mg | Freq: Once | INTRAMUSCULAR | Status: AC
  Administered 2017-09-25: 4 mg via INTRAVENOUS

## 2017-09-25 MED ORDER — ZOLPIDEM TARTRATE 5 MG PO TABS: 5.0000 mg | ORAL_TABLET | Freq: Every evening | ORAL | Status: DC | PRN

## 2017-09-25 NOTE — Progress Notes (Signed)
Paged on call. Waiting response.

## 2017-09-25 NOTE — Progress Notes (Addendum)
[facility] PCCM  S: No events overnight, complain of diffuse pain in her chest  O:  Vitals:   09/25/17 0700 09/25/17 0800 09/25/17 0808 09/25/17 0900  BP: (!) 166/60 121/64  (!) 144/71  Pulse:      Resp: 18 (!) 24  20  Temp:   97.9 F (36.6 C)   TempSrc:   Oral   SpO2: 98% 99%  98%  Weight:      Height:       3L [place] with sat of 98-100% while at rest  General:  Resting comfortably in exam, complains of pain diffusely HENT: NCAT OP clear PULM: Decreased BS in all lung fields CV: RRR, Nl S1/S2 and -M/R/G. GI: Soft, NT, ND and +BS MSK: Intact Neuro: Alert and interactive, moving all ext to command Skin: bruising over the chest noted  BMET    Component Value Date/Time   NA 138 09/25/2017 0359   NA 137 09/20/2017 0958   NA 141 05/29/2017 1136   K 3.4 (L) 09/25/2017 0359   K 4.5 05/29/2017 1136   CL 100 (L) 09/25/2017 0359   CL 105 07/17/2012 1434   CO2 29 09/25/2017 0359   CO2 28 05/29/2017 1136   GLUCOSE 111 (H) 09/25/2017 0359   GLUCOSE 89 05/29/2017 1136   GLUCOSE 133 (H) 07/17/2012 1434   BUN 15 09/25/2017 0359   BUN 25 09/20/2017 0958   BUN 26.1 (H) 05/29/2017 1136   CREATININE 0.82 09/25/2017 0359   CREATININE 1.3 (H) 05/29/2017 1136   CALCIUM 9.1 09/25/2017 0359   CALCIUM 10.4 05/29/2017 1136   GFRNONAA >60 09/25/2017 0359   GFRAA >60 09/25/2017 0359   CBC    Component Value Date/Time   WBC 12.2 (H) 09/24/2017 0221   RBC 2.80 (L) 09/24/2017 0221   HGB 7.5 (L) 09/24/2017 0221   HGB 10.4 (L) 09/20/2017 0958   HGB 12.3 05/29/2017 1136   HCT 23.6 (L) 09/24/2017 0221   HCT 32.3 (L) 09/20/2017 0958   HCT 37.9 05/29/2017 1136   PLT 188 09/24/2017 0221   PLT 330 09/20/2017 0958   MCV 84.3 09/24/2017 0221   MCV 81 09/20/2017 0958   MCV 91.0 05/29/2017 1136   MCH 26.8 09/24/2017 0221   MCHC 31.8 09/24/2017 0221   RDW 17.9 (H) 09/24/2017 0221   RDW 15.3 09/20/2017 0958   RDW 13.4 05/29/2017 1136   LYMPHSABS 2.4 09/24/2017 0221   LYMPHSABS 1.6 05/29/2017 1136    MONOABS 2.0 (H) 09/24/2017 0221   MONOABS 0.8 05/29/2017 1136   EOSABS 0.2 09/24/2017 0221   EOSABS 0.9 (H) 05/29/2017 1136   BASOSABS 0.0 09/24/2017 0221   BASOSABS 0.1 05/29/2017 1136   I reviewed CXR myself, emphysema and atelectasis noted  Impression: Cardiac arrest Coronary artery disease COPD Flail chest Dyspnea Mild chest pain  Plan: Continue pulmonary hygienes as ordered OOB to chair as tolerated Continue brovana and pulmicort PRN Albuterol for wheezing Chest PT and hypertonic saline as needed Titrate O2 for sat of 88-92% No need for steroids for COPD at this point IS and flutter valve for atelectasis now noted on CXR  Code status confirmed DNR.  Discussed with bedside RN and PCCM-NP, ok to transfer out of the ICU.  PCCM will sign off, please call back if needed.  Rush Farmer, M.D. [facility] Pulmonary/Critical Care Medicine. Pager: [phone]. After hours pager: [phone].

## 2017-09-25 NOTE — Progress Notes (Signed)
Progress Note  Patient Name: [name] Date of Encounter: 09/25/2017  Primary Cardiologist: No primary care provider on file.   Subjective   The patient states that she continues to struggle with regard to her chest pain from the CPR and constant cough.  She complains of the cough since extubation on Friday.  With regard to the chest pain states that this is the area of the obvious bruising and began following CPR.  Inpatient Medications    Scheduled Meds: . acetaminophen  650 mg Oral Q8H  . arformoterol  15 mcg Nebulization BID  . aspirin  81 mg Oral Daily  . atenolol  50 mg Oral BID  . atorvastatin  80 mg Oral q1800  . budesonide (PULMICORT) nebulizer solution  0.5 mg Nebulization BID  . calcium-vitamin D  2 tablet Oral Daily  . clopidogrel  75 mg Oral Q breakfast  . enoxaparin (LOVENOX) injection  40 mg Subcutaneous Q24H  . ferrous sulfate  325 mg Oral Q breakfast  . gabapentin  600 mg Oral QHS  . hydrochlorothiazide  12.5 mg Oral Daily  . levothyroxine  100 mcg Oral QAC breakfast  . lidocaine  1 patch Transdermal Q24H  . losartan  50 mg Oral Daily  . mouth rinse  15 mL Mouth Rinse BID  . multivitamin with minerals  1 tablet Oral Daily  . pantoprazole  40 mg Oral Q1200  . potassium chloride  40 mEq Oral BID  . sodium chloride flush  3 mL Intravenous Q12H  . sodium chloride flush  3 mL Intravenous Q12H  . sodium chloride HYPERTONIC  4 mL Nebulization BID  . zolpidem  5 mg Oral QHS   Continuous Infusions: . sodium chloride Stopped (09/21/17 2100)  . sodium chloride    . sodium chloride     PRN Meds: sodium chloride, albuterol, alum & mag hydroxide-simeth, bisacodyl, fentaNYL (SUBLIMAZE) injection, nitroGLYCERIN, ondansetron (ZOFRAN) IV, sodium chloride flush, sodium chloride flush   Vital Signs    Vitals:   09/25/17 0700 09/25/17 0800 09/25/17 0808 09/25/17 0900  BP: (!) 166/60 121/64  (!) 144/71  Pulse:      Resp: 18 (!) 24  20  Temp:   97.9 F (36.6 C)    TempSrc:   Oral   SpO2: 98% 99%  98%  Weight:      Height:        Intake/Output Summary (Last 24 hours) at 09/25/2017 1113 Last data filed at 09/25/2017 0600 Gross per 24 hour  Intake 120 ml  Output 750 ml  Net -630 ml   Filed Weights   09/21/17 0608 09/22/17 0500  Weight: 63.5 kg (140 lb) 65.3 kg (143 lb 15.4 oz)    Telemetry    NSR notable PVCs.- Personally Reviewed  ECG    LBBB consistent with previous evaluation.  Most recent as of 3/1 5- Personally Reviewed  Physical Exam   GEN: No acute distress.  Patient resting in bed.  Oriented to self location and recent events. Neck: No JVD. Cardiac: RRR, no murmurs, rubs, or gallops.   Respiratory: Clear to auscultation bilaterally. There is notable paradoxical motion of the chest wall and was clearly near the sternum to most likely be result of fractured sternum secondary to CPR. GI: Soft, nontender, non-distended  MS: No edema; No deformity.  Catheter insertion site is clean and clear. Neuro:  Nonfocal  Psych: Normal affect   Labs    Chemistry Recent Labs  Lab 09/21/17 2138  09/23/17 0215 09/24/17 0221 09/25/17 0359  NA 139   < > 140 140 138  K 4.4   < > 3.8 3.8 3.4*  CL 109   < > 104 104 100*  CO2 22   < > 26 28 29   GLUCOSE 158*   < > 124* 111* 111*  BUN 27*   < > 28* 24* 15  CREATININE 1.37*   < > 1.13* 0.94 0.82  CALCIUM 8.1*   < > 8.7* 9.2 9.1  ALBUMIN 2.4*  --   --   --   --   GFRNONAA 36*   < > 46* 57* >60  GFRAA 42*   < > 53* >60 >60  ANIONGAP 8   < > 10 8 9    < > = values in this interval not displayed.     Hematology Recent Labs  Lab 09/22/17 0845 09/23/17 0215 09/24/17 0221  WBC 19.0* 14.4* 12.2*  RBC 2.99* 3.07* 2.80*  HGB 7.9* 8.1*  8.1* 7.5*  HCT 26.1* 25.3*  25.2* 23.6*  MCV 87.3 82.4 84.3  MCH 26.4 26.4 26.8  MCHC 30.3 32.0 31.8  RDW 16.1* 18.1* 17.9*  PLT 318 214 188    Cardiac Enzymes Recent Labs  Lab 09/22/17 0118 09/22/17 0413 09/22/17 1237  TROPONINI 7.60* 9.17*  6.25*   No results for input(s): TROPIPOC in the last 168 hours.   BNP Recent Labs  Lab 09/23/17 0215  BNP 326.8*     DDimer No results for input(s): DDIMER in the last 168 hours.   Radiology    Dg Chest Port 1 View  Result Date: 09/24/2017 CLINICAL DATA:  Respiratory failure EXAM: PORTABLE CHEST 1 VIEW COMPARISON:  Chest radiograph 09/22/2017 FINDINGS: Monitoring leads overlie the patient. Stable cardiac and mediastinal contours. Aortic atherosclerosis. Re demonstrated left suprahilar masslike opacity. No pleural effusion or pneumothorax. Stable posterior left fourth rib deformity. IMPRESSION: Stable chest radiograph.  Persistent masslike suprahilar opacity. Electronically Signed   By: Lovey Newcomer M.D.   On: 09/24/2017 07:57    Cardiac Studies   Cardiac cath3/15/2019:  Acute occlusion of the mid right coronary distal to the previously placed stents from earlier in the day. Acute occlusion was accompanied by ventricular fibrillation. The patient was successfully resuscitated.  Successful recanalization of the right coronary and placement of an additional stent overlapping the distal margin of the previously placed stent. Final stent diameter 3.25 mm throughout the entire stented segment from distal to proximal. 3.5 mm diameter [place] balloon was used at the very proximal stent margin after intravascular ultrasound demonstrated decreased apposition. TIMI grade III flow with no evidence of stenosis postprocedure.  Intravascular ultrasound was used and demonstrated acceptable stent apposition from distal to proximal with the exception of the proximal margin which was underexpanded. As noted above a 3.5 balloon was used to further expand the stent.  RECOMMENDATION:  Aggrastat for 10 hours.  Continue Plavix.  Critical care medicine to manage the patient's ventilator.  Continue IV Versed and fentanyl for sedation while on the ventilator.  Discussed the situation with family. Do  not anticipate discharge for several days.  IV hydration and tract kidney function.  Cardiac cath 09/21/2017:   Segmental proximal to mid 90% RCA stenosis. This lesion was treated with stenting using overlapping Synergy 2.5 mm stents to postdilated to 3 mm in diameter with TIMI grade III flow. 0% stenosis was noted post procedure.  Ostial 50-60% circumflex narrowing.  Luminal irregularities throughout the proximal to mid LAD.  Very distal eccentric tandem 70% stenoses near the apex. No focally high-grade stenosis is noted.  Normal left main  Low normal LV systolic function in the 09-98% range. Upper normal left ventricular end-diastolic pressure of 17 mmHg.  RECOMMENDATIONS:  Aspirin and Plavix for at least 6 months in the setting of chronic stable ischemic heart disease. Given overlapping stents it would be preferable to continue dual antiplatelet therapy for 12 months.  Aggressive risk factor modification using high intensity statin therapy for as long as tolerated. I have started atorvastatin 80 mg daily and discontinued pravastatin. Blood pressure control with target 130/80 mmHg or less.  Phase 2 cardiac rehab.  Overnight stay since the patient lives alone, is relatively frail, has had some bleeding from the radial cath site.  CT coronary: 1. Left Main: No significant stenosis. 2. LAD: Proximal CT FFR: 0.92, mid 0.74, distal: 0.64. 3. LCX: Proximal: 0.94, mid 0.79, distal: 0.70. 4. RCA: Proximal 0.96, mid 0.67.  IMPRESSION: 1. CT FFR showed severe stenosis in the proximal to mid RCA, mid LAD and mid LCX arteries. A cardiac catheterization is recommended.  Patient Profile     77 y.o. female with abnormal coronary CT, CKD stage III, mild carotid stenosis, hypothyroidism, GERD, NSCSL status post right upper lobectomy/XRT who initially presented for elective cardiac catheterization on 3/14 and had a DES placed in the RCA.  There was an apparent dissection versus  thrombosis following the initial cath that resulted in repeat cath and intubation with associated A. fib arrest.  A second stent was placed overlapping the distal of the previous stent in the RCA with grade 3 TIMI flow observed following placement of the first and second stents.  Assessment & Plan    CAD status post PCI and acute stent thrombosis: V. fib arrest: Patient states she 90% RCA disease treated PCI and repeat PCI to due to occlusion/dissection.  Patient was successfully resuscitated following ventricular fibrillation with subsequent fracture of the sternum and intubation.  She was successfully extubated the following day. -Continue ASA  -Continue atenolol 50 mg twice daily -Continue clopidogrel 75 mg daily breakfast -Continue atorvastatin 80 mg daily -Physical therapy ordered today -Stable for transfer to stepdown - Lovenox for VTE prophylaxis  Cough: This appears to be secondary to her underlying COPD, as well as her non-small cell carcinoma of the lung in conjunction with increased respiratory secretions from intubation and decreased forced expiration sternal fracture status post CPR.  Mechanical treatment with flutter valve and spirometry is been continued.  Chest x-ray unremarkable for new or active acute pulmonary process.  No evidence of aspiration on chest x-ray.  The patient remains afebrile, with improving leukocytosis. -Continue Brovana nebulizer twice daily -Continue flutter valve daily -Continue albuterol nebulizers every 4 hours as needed for wheezing --Continue Pulmicort nebulizers twice daily  Hypertension: Current BP is mildly improved with the previous several readings.  Continue current treatment with home HCTZ and atenolol.  Losartan was added 3/16 -Continue losartan 50 mg daily  Hyperlipidemia LDL goal is less than 70.  It was 113 in February 2019.  Atorvastatin was started on this admission, however the, patient states that she cannot tolerate high-dose  statins 2/2 muscle pain.  May need to consider PCSK 9 inhibitor if affordable and if she fails to tolerate current statin therapy.   -Will need lipids and CMP in 6-8 weeks after discharge. -Continue atorvastatin 80 mg daily  Anemia, iron deficiency: Acute blood loss from groin which appears to have resolved.  However,  patient's anemia continues to slowly worsen despite replacement with 1 unit of packed red blood cells. -Ordered 1 unit PRBCs to be transfused -Continue ferrous sulfate tablets 325 mg daily -H&H posttransfusion -Repeat CBC in a.m.  CKD III: Creatinine improving 0.821 peak of 1.42. -Continue daily BMPs to monitor  Hypokalemia: Potassium noted to be 3.4 BMP this a.m. - K-Dur 40 mEq x2 doses today -Repeat BMP in a.m.  Insomnia: Due to the cough and constant interruptions throughout the night the patient continues to to be concerned with inability to sleep times 2 days. - Initiate sleep aid treatment with Ambien 5 mg nightly x2 doses  For questions or updates, please contact [place] Please consult [email or web address] for contact info under Cardiology/STEMI.   Please see attending note/attestation for current assessment and plan.  Signed, Kathi Ludwig, MD  09/25/2017, 11:13 AM

## 2017-09-25 NOTE — Care Management Note (Signed)
Case Management Note Marvetta Gibbons RN, BSN Unit 4E-Case Manager- [place] coverage [phone]  Patient Details  Name: [name] MRN: [id number] Date of Birth: 1941-02-28  Subjective/Objective:  Pt admitted with CAD s/p PCI with stents                  Action/Plan: PTA pt lived at home home alone, anticipate return home, plan for asa/plavix- CM to follow of transition of care needs.   Expected Discharge Date:                  Expected Discharge Plan:     In-House Referral:     Discharge planning Services  CM Consult  Post Acute Care Choice:    Choice offered to:     DME Arranged:    DME Agency:     HH Arranged:    HH Agency:     Status of Service:  In process, will continue to follow  If discussed at Long Length of Stay Meetings, dates discussed:    Discharge Disposition:   Additional Comments:  Dawayne Patricia, RN 09/25/2017, 10:31 AM

## 2017-09-25 NOTE — Progress Notes (Signed)
CARDIAC REHAB PHASE I   PRE:  Rate/Rhythm: 90 SR  BP:  Supine:   Sitting: 170/77  Standing:    SaO2:97% 2L   MODE:  Ambulation: 40 ft  Outside of room   POST:  Rate/Rhythm: 105 ST  BP:  Supine:   Sitting: 143/60  Standing:    SaO2: 98% 2L 1305-1337 Pt encouraged to get OOB and walk. Pt walked 40 ft outside of room with asst x2, rolling walker and gait belt use with slow steady gait. C/o chest soreness from CPR. Pt stated she does not walk far at home. To recliner with call bell. Encouraged to stay in chair as tolerated for pulmonary status.  Graylon Good, RN BSN  09/25/2017 1:32 PM

## 2017-09-26 LAB — CBC
HCT: 26.1 % — ABNORMAL LOW (ref 36.0–46.0)
Hemoglobin: 8.4 g/dL — ABNORMAL LOW (ref 12.0–15.0)
MCH: 28 pg (ref 26.0–34.0)
MCHC: 32.2 g/dL (ref 30.0–36.0)
MCV: 87 fL (ref 78.0–100.0)
PLATELETS: 213 10*3/uL (ref 150–400)
RBC: 3 MIL/uL — AB (ref 3.87–5.11)
RDW: 17.1 % — AB (ref 11.5–15.5)
WBC: 9.7 10*3/uL (ref 4.0–10.5)

## 2017-09-26 LAB — TYPE AND SCREEN
ABO/RH(D): B NEG
Antibody Screen: NEGATIVE
UNIT DIVISION: 0
Unit division: 0

## 2017-09-26 LAB — BPAM RBC
Blood Product Expiration Date: 201903212359
Blood Product Expiration Date: 201903242359
ISSUE DATE / TIME: 201903151522
ISSUE DATE / TIME: 201903181345
UNIT TYPE AND RH: 1700
Unit Type and Rh: 1700

## 2017-09-26 LAB — HEMOGLOBIN AND HEMATOCRIT, BLOOD
HEMATOCRIT: 19 % — AB (ref 36.0–46.0)
Hemoglobin: 6 g/dL — CL (ref 12.0–15.0)

## 2017-09-26 LAB — BASIC METABOLIC PANEL
Anion gap: 8 (ref 5–15)
BUN: 31 mg/dL — AB (ref 6–20)
CALCIUM: 8.8 mg/dL — AB (ref 8.9–10.3)
CHLORIDE: 102 mmol/L (ref 101–111)
CO2: 27 mmol/L (ref 22–32)
CREATININE: 0.81 mg/dL (ref 0.44–1.00)
Glucose, Bld: 106 mg/dL — ABNORMAL HIGH (ref 65–99)
Potassium: 5 mmol/L (ref 3.5–5.1)
Sodium: 137 mmol/L (ref 135–145)

## 2017-09-26 MED ORDER — CARVEDILOL 25 MG PO TABS
25.0000 mg | ORAL_TABLET | Freq: Two times a day (BID) | ORAL | Status: DC
  Administered 2017-09-26: 25 mg via ORAL
  Filled 2017-09-26 (×2): qty 1

## 2017-09-26 MED ORDER — OXYCODONE-ACETAMINOPHEN 5-325 MG PO TABS: 1.0000 | ORAL_TABLET | ORAL | Status: DC | PRN

## 2017-09-26 MED ORDER — ACETAMINOPHEN 325 MG PO TABS: 650.0000 mg | ORAL_TABLET | Freq: Four times a day (QID) | ORAL | Status: DC | PRN

## 2017-09-26 MED ORDER — IBUPROFEN 200 MG PO TABS
200.0000 mg | ORAL_TABLET | Freq: Two times a day (BID) | ORAL | Status: DC | PRN
  Administered 2017-09-26: 200 mg via ORAL
  Filled 2017-09-26: qty 1

## 2017-09-26 MED ORDER — ACETAMINOPHEN 500 MG PO TABS
1000.0000 mg | ORAL_TABLET | Freq: Three times a day (TID) | ORAL | Status: DC | PRN
  Administered 2017-09-26 – 2017-10-01 (×6): 1000 mg via ORAL
  Filled 2017-09-26 (×6): qty 2

## 2017-09-26 MED ORDER — SODIUM CHLORIDE 0.9 % IV SOLN
Freq: Once | INTRAVENOUS | Status: AC
  Administered 2017-09-27: 02:00:00 via INTRAVENOUS

## 2017-09-26 MED ORDER — CALCIUM CARBONATE ANTACID 500 MG PO CHEW
1.0000 | CHEWABLE_TABLET | Freq: Every day | ORAL | Status: DC | PRN
  Administered 2017-09-26: 200 mg via ORAL
  Filled 2017-09-26: qty 1

## 2017-09-26 NOTE — Progress Notes (Signed)
CRITICAL VALUE ALERT  Critical Value:  Hgb- 6.0  Date & Time Notied:  2725 09/26/2017  Provider Notified: Attending  Orders Received/Actions taken: 1 Unit blood transfusion.    Fransico Michael, RN

## 2017-09-26 NOTE — Progress Notes (Signed)
Paged on call NP about patients continued nausea and dizziness. Patient had a loose black tarry stool at shift change.Currently waiting on a response. Will continue to monitor patient closely.

## 2017-09-26 NOTE — Progress Notes (Signed)
Progress Note  Patient Name: [name] Date of Encounter: 09/26/2017  Primary Cardiologist: No primary care provider on file.   Subjective   The patient stated that she slept well overnight in bed after having been given the Ambien.  The chest pain continues to be an issue but has improved minimally.  She describes the pain as related to her post CPR sternal fracture. Her cough has improved significantly and she is reportedly in much better spirits today.  Inpatient Medications    Scheduled Meds: . acetaminophen  650 mg Oral Q8H  . arformoterol  15 mcg Nebulization BID  . aspirin  81 mg Oral Daily  . atenolol  50 mg Oral BID  . atorvastatin  80 mg Oral q1800  . budesonide (PULMICORT) nebulizer solution  0.5 mg Nebulization BID  . calcium-vitamin D  2 tablet Oral Daily  . clopidogrel  75 mg Oral Q breakfast  . enoxaparin (LOVENOX) injection  40 mg Subcutaneous Q24H  . ferrous sulfate  325 mg Oral Q breakfast  . gabapentin  600 mg Oral QHS  . hydrochlorothiazide  12.5 mg Oral Daily  . levothyroxine  100 mcg Oral QAC breakfast  . lidocaine  1 patch Transdermal Q24H  . losartan  50 mg Oral Daily  . mouth rinse  15 mL Mouth Rinse BID  . multivitamin with minerals  1 tablet Oral Daily  . pantoprazole  40 mg Oral Q1200  . sodium chloride flush  3 mL Intravenous Q12H  . sodium chloride flush  3 mL Intravenous Q12H  . sodium chloride HYPERTONIC  4 mL Nebulization BID  . zolpidem  5 mg Oral QHS   Continuous Infusions: . sodium chloride Stopped (09/21/17 2100)  . sodium chloride     PRN Meds: sodium chloride, albuterol, alum & mag hydroxide-simeth, bisacodyl, fentaNYL (SUBLIMAZE) injection, hydrALAZINE, nitroGLYCERIN, ondansetron (ZOFRAN) IV, sodium chloride flush, sodium chloride flush   Vital Signs    Vitals:   09/26/17 0300 09/26/17 0330 09/26/17 0341 09/26/17 0400  BP: (!) 121/52 (!) 111/55  124/81  Pulse:    79  Resp: 17 18  19   Temp:   98.4 F (36.9 C)     TempSrc:   Axillary   SpO2: 100% 100%  100%  Weight:      Height:        Intake/Output Summary (Last 24 hours) at 09/26/2017 0646 Last data filed at 09/25/2017 2200 Gross per 24 hour  Intake 395 ml  Output 800 ml  Net -405 ml   Filed Weights   09/21/17 0608 09/22/17 0500  Weight: 63.5 kg (140 lb) 65.3 kg (143 lb 15.4 oz)    Telemetry    Occasional PVCs- Personally Reviewed  ECG    LBBB previous evaluation 3/15- Personally Reviewed  Physical Exam   GEN: No acute distress.  Resting in bed alert and oriented x3 Neck: No JVD Cardiac: RRR, no murmurs, rubs, or gallops.  Respiratory: Clear to auscultation bilaterally.  There is again notable paradoxical motion of the chest wall chest, deviation of the sternum. GI: Soft, nontender, non-distended  MS: No edema; No deformity.  The catheter insertion site is clean clear Neuro:  Nonfocal  Psych: Normal affect   Labs    Chemistry Recent Labs  Lab 09/21/17 2138  09/24/17 0221 09/25/17 0359 09/26/17 0218  NA 139   < > 140 138 137  K 4.4   < > 3.8 3.4* 5.0  CL 109   < > 104  100* 102  CO2 22   < > 28 29 27   GLUCOSE 158*   < > 111* 111* 106*  BUN 27*   < > 24* 15 31*  CREATININE 1.37*   < > 0.94 0.82 0.81  CALCIUM 8.1*   < > 9.2 9.1 8.8*  ALBUMIN 2.4*  --   --   --   --   GFRNONAA 36*   < > 57* >60 >60  GFRAA 42*   < > >60 >60 >60  ANIONGAP 8   < > 8 9 8    < > = values in this interval not displayed.     Hematology Recent Labs  Lab 09/23/17 0215 09/24/17 0221 09/25/17 1854 09/26/17 0218  WBC 14.4* 12.2*  --  9.7  RBC 3.07* 2.80*  --  3.00*  HGB 8.1*  8.1* 7.5* 9.1* 8.4*  HCT 25.3*  25.2* 23.6* 28.2* 26.1*  MCV 82.4 84.3  --  87.0  MCH 26.4 26.8  --  28.0  MCHC 32.0 31.8  --  32.2  RDW 18.1* 17.9*  --  17.1*  PLT 214 188  --  213    Cardiac Enzymes Recent Labs  Lab 09/22/17 0118 09/22/17 0413 09/22/17 1237  TROPONINI 7.60* 9.17* 6.25*   No results for input(s): TROPIPOC in the last 168 hours.    BNP Recent Labs  Lab 09/23/17 0215  BNP 326.8*     DDimer No results for input(s): DDIMER in the last 168 hours.   Radiology    No results found.  Cardiac Studies   Cardiac cath3/15/2019:  Acute occlusion of the mid right coronary distal to the previously placed stents from earlier in the day. Acute occlusion was accompanied by ventricular fibrillation. The patient was successfully resuscitated.  Successful recanalization of the right coronary and placement of an additional stent overlapping the distal margin of the previously placed stent. Final stent diameter 3.25 mm throughout the entire stented segment from distal to proximal. 3.5 mm diameter [place] balloon was used at the very proximal stent margin after intravascular ultrasound demonstrated decreased apposition. TIMI grade III flow with no evidence of stenosis postprocedure.  Intravascular ultrasound was used and demonstrated acceptable stent apposition from distal to proximal with the exception of the proximal margin which was underexpanded. As noted above a 3.5 balloon was used to further expand the stent.  RECOMMENDATION:  Aggrastat for 10 hours.  Continue Plavix.  Critical care medicine to manage the patient's ventilator.  Continue IV Versed and fentanyl for sedation while on the ventilator.  Discussed the situation with family. Do not anticipate discharge for several days.  IV hydration and tract kidney function.  Cardiac cath 09/21/2017:   Segmental proximal to mid 90% RCA stenosis. This lesion was treated with stenting using overlapping Synergy 2.5 mm stents to postdilated to 3 mm in diameter with TIMI grade III flow. 0% stenosis was noted post procedure.  Ostial 50-60% circumflex narrowing.  Luminal irregularities throughout the proximal to mid LAD. Very distal eccentric tandem 70% stenoses near the apex. No focally high-grade stenosis is noted.  Normal left main  Low normal LV systolic  function in the 50-55% range. Upper normal left ventricular end-diastolic pressure of 17 mmHg.  RECOMMENDATIONS:  Aspirin and Plavix for at least 6 months in the setting of chronic stable ischemic heart disease. Given overlapping stents it would be preferable to continue dual antiplatelet therapy for 12 months.  Aggressive risk factor modification using high intensity statin therapy for as  long as tolerated. I have started atorvastatin 80 mg daily and discontinued pravastatin. Blood pressure control with target 130/80 mmHg or less.  Phase 2 cardiac rehab.  Overnight stay since the patient lives alone, is relatively frail, has had some bleeding from the radial cath site.  CT coronary: 1. Left Main: No significant stenosis. 2. LAD: Proximal CT FFR: 0.92, mid 0.74, distal: 0.64. 3. LCX: Proximal: 0.94, mid 0.79, distal: 0.70. 4. RCA: Proximal 0.96, mid 0.67.  IMPRESSION: 1. CT FFR showed severe stenosis in the proximal to mid RCA, mid LAD and mid LCX arteries. A cardiac catheterization is recommended.  Patient Profile     77 y.o. female with abnormal coronary CT, CKD stage III, mild carotid stenosis, hypothyroidism, GERD, NSCSL status post right upper lobectomy/XRT who initially presented for elective cardiac catheterization on 3/14 and had a DES placed in the RCA.  There was an apparent dissection versus thrombosis following the initial cath that resulted in repeat cath and intubation with associated A. fib arrest.  A second stent was placed overlapping the distal of the previous stent in the RCA with grade 3 TIMI flow observed following placement of the first and second stents.  Assessment & Plan   CAD/P PCI and acute stent thrombosis: V. fib arrest: Patient is a 90% RCA disease treated with PCI repeat PCI due to thrombosis/dissection.  He was successfully resuscitated with CPR initial intervention however developed a sternal fracture during CPR which continues to be  painful. -Continue ASA -Discontinue atenolol 50 mg twice daily -Coreg 20 mg twice daily -Continue clopidogrel 75 mg daily with breakfast -Continue atorvastatin 80 mg daily - Continue physical therapy.  Patient able to ambulate with cardiac rehab. - Continues to be stable to transfer to stepdown -Continue Lovenox DVT prophylaxis  Cough: This appears to be secondary to her underlying COPD and NSCLC as well as increased secretions secondary to previous intubation.  Her cough is improved dramatically she continues to be afebrile without leukocytosis. -Continue Brovana nebulizer twice daily -Continue flutter valve daily -Continue albuterol nebulizers every 2 hours as needed for wheezing -Continue Pulmicort nebulizers twice daily  Hypertension: Current BP continues to trend upward.  This is likely due to increased chest pain she is experienced.  No longer on HCTZ and discontinuing atenolol today. -Continue losartan 50 mg twice daily -Continue hydralazine 10 mg IV as needed for systolic blood pressure greater than 170  -Coreg 25 mg twice daily  CKD III: Creatinine improving to 0.81 with a peak of 1.42. -Continue to 8-lead BMPs to monitor creatinine and potassium  Hyperlipidemia: LDL goal less than 70 is ideal.  -Continue atorvastatin 80 mg daily -We will do lipid profile and CMP in 6-8 weeks after discharge.  Hypokalemia: (Resolved) Potassium noted to be 5.0 on BMP this a.m.  No acute intervention indicated. Repeat BMP in am  Insomnia: Improved with Ambien x1 dose. -Continue Ambien x1 additional dose for sleep a. -May need to continue this given the patient's chest pain and inability to sleep adequately.  For questions or updates, please contact [place] Please consult [email or web address] for contact info under Cardiology/STEMI.   Please see attending note/attestation for current assessment and plan  Signed, Kathi Ludwig, MD  09/26/2017, 6:46 AM

## 2017-09-26 NOTE — Progress Notes (Signed)
Pt sts she can't walk this afternoon, she is having nausea issues. I offered education but she doesn't feel up to it. Left materials. Encouraged more walking with RN later. I also encouraged her to do IS. [place] CES, ACSM 1:55 PM 09/26/2017

## 2017-09-26 NOTE — Progress Notes (Signed)
Patient being transferred to 4E16. Notified receiving RN to follow up on pending H & H.

## 2017-09-26 NOTE — Evaluation (Signed)
Physical Therapy Evaluation Patient Details Name: [name] MRN: [id number] DOB: 02-26-1941 Today's Date: 09/26/2017   History of Present Illness  Pt is a 77 y.o. female admitted 09/21/17 for elective cardiac cath. There wasan apparent dissection versus thrombosis following the initial cath that resulted in repeat cath 3/15 and intubation with associated A-fib arrest requiring CPR; a second stent was placed. PMH includes CKD III, HTN, COPD, non-small cell lung cancer (radiation 2013, 2015), arthritis.    Clinical Impression  Pt presents with decreased activity tolerance and an overall decrease in functional mobility secondary to above. PTA, pt indep and lives alone; reports she can have family and/or aide support available 24/7 at discharge if needed. Today, pt able to amb 150' total with RW and intermittent min guard for balance; required 1x seated rest break secondary to fatigue. SpO2 >88% on RA. Pt would benefit from continued acute PT services to maximize functional mobility and independence prior to d/c with HHPT services.     Follow Up Recommendations Home health PT;Supervision/Assistance - 24 hour    Equipment Recommendations  Rolling walker with 5" wheels    Recommendations for Other Services OT consult     Precautions / Restrictions Precautions Precautions: Fall Precaution Comments: Chest soreness/brusing post-CPR Restrictions Weight Bearing Restrictions: No      Mobility  Bed Mobility Overal bed mobility: Modified Independent             General bed mobility comments: HOB elevated  Transfers Overall transfer level: Needs assistance Equipment used: Rolling walker (2 wheeled) Transfers: Sit to/from Stand Sit to Stand: Supervision         General transfer comment: Stood from bed, chair, and recliner with RW and supervision for safety; cues for correct hand placement on RW  Ambulation/Gait Ambulation/Gait assistance: Min guard Ambulation Distance (Feet):  70 Feet(+80) Assistive device: Rolling walker (2 wheeled) Gait Pattern/deviations: Step-through pattern;Decreased stride length;Trunk flexed Gait velocity: Decreased Gait velocity interpretation: <1.8 ft/sec, indicative of risk for recurrent falls General Gait Details: Slow, steady amb with RW and intermittent min guard for balance. Amb 52' requiring 1x seated rest break due to fatigue, then amb an additional 80'. SpO2 88-96% on RA  Stairs            Wheelchair Mobility    Modified Rankin (Stroke Patients Only)       Balance Overall balance assessment: Needs assistance   Sitting balance-Leahy Scale: Fair Sitting balance - Comments: Required assist to don socks due to c/o chest soreness     Standing balance-Leahy Scale: Fair Standing balance comment: Can static stand with no UE support; dynamic stability improved with RW                             Pertinent Vitals/Pain Pain Assessment: Faces Faces Pain Scale: Hurts a little bit Pain Location: Chest bruising Pain Descriptors / Indicators: Sore;Guarding Pain Intervention(s): Monitored during session    Home Living Family/patient expects to be discharged to:: Private residence Living Arrangements: Alone Available Help at Discharge: Family;Available 24 hours/day Type of Home: House Home Access: Stairs to enter Entrance Stairs-Rails: Right Entrance Stairs-Number of Steps: 1 Home Layout: Two level;Able to live on main level with bedroom/bathroom Home Equipment: Gilford Rile - 2 wheels;Walker - 4 wheels;Cane - single point;Bedside commode;Shower seat;Wheelchair - manual Additional Comments: Pt lives alone but has family who can stay with her at d/c if needed    Prior Function Level of Independence: Independent  Comments: Indep. Drives     Hand Dominance        Extremity/Trunk Assessment   Upper Extremity Assessment Upper Extremity Assessment: Generalized weakness    Lower Extremity  Assessment Lower Extremity Assessment: Generalized weakness       Communication   Communication: No difficulties  Cognition Arousal/Alertness: Awake/alert Behavior During Therapy: WFL for tasks assessed/performed Overall Cognitive Status: Within Functional Limits for tasks assessed                                        General Comments General comments (skin integrity, edema, etc.): Resting BP 172/69, post-amb BP 139/60    Exercises     Assessment/Plan    PT Assessment Patient needs continued PT services  PT Problem List Decreased strength;Decreased activity tolerance;Decreased balance;Decreased mobility;Decreased knowledge of use of DME;Cardiopulmonary status limiting activity       PT Treatment Interventions DME instruction;Gait training;Stair training;Functional mobility training;Therapeutic activities;Therapeutic exercise;Balance training;Patient/family education    PT Goals (Current goals can be found in the Care Plan section)  Acute Rehab PT Goals Patient Stated Goal: Return home PT Goal Formulation: With patient Time For Goal Achievement: 10/10/17 Potential to Achieve Goals: Good    Frequency Min 3X/week   Barriers to discharge        Co-evaluation               AM-PAC PT "6 Clicks" Daily Activity  Outcome Measure Difficulty turning over in bed (including adjusting bedclothes, sheets and blankets)?: A Little Difficulty moving from lying on back to sitting on the side of the bed? : A Little Difficulty sitting down on and standing up from a chair with arms (e.g., wheelchair, bedside commode, etc,.)?: A Little Help needed moving to and from a bed to chair (including a wheelchair)?: A Little Help needed walking in hospital room?: A Little Help needed climbing 3-5 steps with a railing? : A Little 6 Click Score: 18    End of Session Equipment Utilized During Treatment: Gait belt Activity Tolerance: Patient tolerated treatment well;Patient  limited by fatigue Patient left: in chair;with call bell/phone within reach Nurse Communication: Mobility status PT Visit Diagnosis: Other abnormalities of gait and mobility (R26.89)    Time: 8299-3716 PT Time Calculation (min) (ACUTE ONLY): 25 min   Charges:   PT Evaluation $PT Eval Moderate Complexity: 1 Mod PT Treatments $Gait Training: 8-22 mins   PT G Codes:       Mabeline Caras, PT, DPT Acute Rehab Services  Pager: [place] 09/26/2017, 8:45 AM

## 2017-09-27 DIAGNOSIS — D62 Acute posthemorrhagic anemia: Secondary | ICD-10-CM

## 2017-09-27 DIAGNOSIS — C3492 Malignant neoplasm of unspecified part of left bronchus or lung: Secondary | ICD-10-CM

## 2017-09-27 DIAGNOSIS — K922 Gastrointestinal hemorrhage, unspecified: Secondary | ICD-10-CM

## 2017-09-27 LAB — PREPARE RBC (CROSSMATCH)

## 2017-09-27 LAB — HEMOGLOBIN AND HEMATOCRIT, BLOOD
HCT: 19.9 % — ABNORMAL LOW (ref 36.0–46.0)
Hemoglobin: 6.6 g/dL — CL (ref 12.0–15.0)

## 2017-09-27 LAB — BASIC METABOLIC PANEL
ANION GAP: 6 (ref 5–15)
BUN: 48 mg/dL — ABNORMAL HIGH (ref 6–20)
CHLORIDE: 105 mmol/L (ref 101–111)
CO2: 26 mmol/L (ref 22–32)
Calcium: 8.9 mg/dL (ref 8.9–10.3)
Creatinine, Ser: 0.92 mg/dL (ref 0.44–1.00)
GFR calc Af Amer: >60 mL/min (ref >60)
GFR, EST NON AFRICAN AMERICAN: 59 mL/min — AB (ref >60)
GLUCOSE: 152 mg/dL — AB (ref 65–99)
POTASSIUM: 4.4 mmol/L (ref 3.5–5.1)
Sodium: 137 mmol/L (ref 135–145)

## 2017-09-27 LAB — CBC
HCT: 18.7 % — ABNORMAL LOW (ref 36.0–46.0)
HEMOGLOBIN: 5.7 g/dL — AB (ref 12.0–15.0)
MCH: 26.6 pg (ref 26.0–34.0)
MCHC: 30.5 g/dL (ref 30.0–36.0)
MCV: 87.4 fL (ref 78.0–100.0)
PLATELETS: 253 10*3/uL (ref 150–400)
RBC: 2.14 MIL/uL — AB (ref 3.87–5.11)
RDW: 17.5 % — ABNORMAL HIGH (ref 11.5–15.5)
WBC: 14.5 10*3/uL — AB (ref 4.0–10.5)

## 2017-09-27 MED ORDER — ACETAMINOPHEN 325 MG PO TABS
650.0000 mg | ORAL_TABLET | Freq: Once | ORAL | Status: AC
  Administered 2017-09-27: 650 mg via ORAL
  Filled 2017-09-27: qty 2

## 2017-09-27 MED ORDER — PANTOPRAZOLE SODIUM 40 MG IV SOLR
40.0000 mg | INTRAVENOUS | Status: DC
  Administered 2017-09-27 – 2017-09-28 (×3): 40 mg via INTRAVENOUS
  Filled 2017-09-27 (×3): qty 40

## 2017-09-27 MED ORDER — DIPHENHYDRAMINE HCL 50 MG/ML IJ SOLN
25.0000 mg | Freq: Once | INTRAMUSCULAR | Status: AC
  Administered 2017-09-27: 25 mg via INTRAVENOUS
  Filled 2017-09-27: qty 1

## 2017-09-27 MED ORDER — SODIUM CHLORIDE 0.9 % IV SOLN
Freq: Once | INTRAVENOUS | Status: AC
  Administered 2017-09-27: 15:00:00 via INTRAVENOUS

## 2017-09-27 NOTE — Progress Notes (Signed)
Progress Note  Patient Name: [name] Date of Encounter: 09/27/2017  Primary Cardiologist: Ena Dawley MD  Subjective   Patient feels very weak. Having active dark tarry stools beginning last night. Hgb decreased to 5.7. Transfused one unit. Having stool incontinence. Complains of stomach pain. No chest pain. Breathing is OK. Less cough.  Inpatient Medications    Scheduled Meds: . acetaminophen  650 mg Oral Once  . arformoterol  15 mcg Nebulization BID  . aspirin  81 mg Oral Daily  . atorvastatin  80 mg Oral q1800  . budesonide (PULMICORT) nebulizer solution  0.5 mg Nebulization BID  . calcium-vitamin D  2 tablet Oral Daily  . carvedilol  25 mg Oral BID WC  . clopidogrel  75 mg Oral Q breakfast  . diphenhydrAMINE  25 mg Intravenous Once  . ferrous sulfate  325 mg Oral Q breakfast  . gabapentin  600 mg Oral QHS  . hydrochlorothiazide  12.5 mg Oral Daily  . levothyroxine  100 mcg Oral QAC breakfast  . lidocaine  1 patch Transdermal Q24H  . losartan  50 mg Oral Daily  . mouth rinse  15 mL Mouth Rinse BID  . multivitamin with minerals  1 tablet Oral Daily  . pantoprazole  40 mg Oral Q1200   Continuous Infusions: . sodium chloride     PRN Meds: acetaminophen, albuterol, alum & mag hydroxide-simeth, bisacodyl, calcium carbonate, hydrALAZINE, nitroGLYCERIN, ondansetron (ZOFRAN) IV   Vital Signs    Vitals:   09/26/17 2127 09/27/17 0100 09/27/17 0125 09/27/17 0345  BP:  (!) 102/44 (!) 127/49 (!) 129/51  Pulse: 88 91 97 93  Resp: 20 (!) 22 15 (!) 21  Temp:  97.7 F (36.5 C) 97.6 F (36.4 C) 97.8 F (36.6 C)  TempSrc:  Oral Oral Oral  SpO2: 92% 93% 92% 95%  Weight:      Height:        Intake/Output Summary (Last 24 hours) at 09/27/2017 0752 Last data filed at 09/27/2017 0125 Gross per 24 hour  Intake 180 ml  Output -  Net 180 ml   Filed Weights   09/21/17 0608 09/22/17 0500 09/26/17 2104  Weight: 140 lb (63.5 kg) 143 lb 15.4 oz (65.3 kg) 137 lb 2 oz  (62.2 kg)    Telemetry    NSR in 90s - Personally Reviewed  ECG    None today - Personally Reviewed  Physical Exam   GEN: thin WF appears pale and weak Neck: No JVD Chest: anterior chest still flail with inspiration. Bruised.  Cardiac: RRR, no murmurs, rubs, or gallops.  Respiratory: Clear to auscultation bilaterally. GI: Soft, nontender, non-distended watery black stool in bed.  MS: No edema; No deformity. Neuro:  Nonfocal  Psych: Normal affect   Labs    Chemistry Recent Labs  Lab 09/21/17 2138  09/25/17 0359 09/26/17 0218 09/27/17 0034  NA 139   < > 138 137 137  K 4.4   < > 3.4* 5.0 4.4  CL 109   < > 100* 102 105  CO2 22   < > 29 27 26   GLUCOSE 158*   < > 111* 106* 152*  BUN 27*   < > 15 31* 48*  CREATININE 1.37*   < > 0.82 0.81 0.92  CALCIUM 8.1*   < > 9.1 8.8* 8.9  ALBUMIN 2.4*  --   --   --   --   GFRNONAA 36*   < > >60 >60 59*  GFRAA  42*   < > >60 >60 >60  ANIONGAP 8   < > 9 8 6    < > = values in this interval not displayed.     Hematology Recent Labs  Lab 09/24/17 0221  09/26/17 0218 09/26/17 2019 09/27/17 0034  WBC 12.2*  --  9.7  --  14.5*  RBC 2.80*  --  3.00*  --  2.14*  HGB 7.5*   < > 8.4* 6.0* 5.7*  HCT 23.6*   < > 26.1* 19.0* 18.7*  MCV 84.3  --  87.0  --  87.4  MCH 26.8  --  28.0  --  26.6  MCHC 31.8  --  32.2  --  30.5  RDW 17.9*  --  17.1*  --  17.5*  PLT 188  --  213  --  253   < > = values in this interval not displayed.    Cardiac Enzymes Recent Labs  Lab 09/22/17 0118 09/22/17 0413 09/22/17 1237  TROPONINI 7.60* 9.17* 6.25*   No results for input(s): TROPIPOC in the last 168 hours.   BNP Recent Labs  Lab 09/23/17 0215  BNP 326.8*     DDimer No results for input(s): DDIMER in the last 168 hours.   Radiology    No results found.  Cardiac Studies   Procedures   CORONARY STENT INTERVENTION  LEFT HEART CATH AND CORONARY ANGIOGRAPHY  Conclusion    Segmental proximal to mid 90% RCA stenosis.  This lesion  was treated with stenting using overlapping Synergy 2.5 mm stents to postdilated to 3 mm in diameter with TIMI grade III flow.  0% stenosis was noted post procedure.  Ostial 50-60% circumflex narrowing.  Luminal irregularities throughout the proximal to mid LAD.  Very distal eccentric tandem 70% stenoses near the apex.  No focally high-grade stenosis is noted.  Normal left main  Low normal LV systolic function in the 62-83% range.  Upper normal left ventricular end-diastolic pressure of 17 mmHg.  RECOMMENDATIONS:   Aspirin and Plavix for at least 6 months in the setting of chronic stable ischemic heart disease.  Given overlapping stents it would be preferable to continue dual antiplatelet therapy for 12 months.  Aggressive risk factor modification using high intensity statin therapy for as long as tolerated.  I have started atorvastatin 80 mg daily and discontinued pravastatin.  Blood pressure control with target 130/80 mmHg or less.  Phase 2 cardiac rehab.  Overnight stay since the patient lives alone, is relatively frail, has had some bleeding from the radial cath site.   Procedures   Coronary/Graft Acute MI Revascularization  Intravascular Ultrasound/IVUS  LEFT HEART CATH AND CORONARY ANGIOGRAPHY  Conclusion    Acute occlusion of the mid right coronary distal to the previously placed stents from earlier in the day.  Acute occlusion was accompanied by ventricular fibrillation.  The patient was successfully resuscitated.  Successful recanalization of the right coronary and placement of an additional stent overlapping the distal margin of the previously placed stent.  Final stent diameter 3.25 mm throughout the entire stented segment from distal to proximal.  3.5 mm diameter [place] balloon was used at the very proximal stent margin after intravascular ultrasound demonstrated decreased apposition.  TIMI grade III flow with no evidence of stenosis postprocedure.  Intravascular ultrasound  was used and demonstrated acceptable stent apposition from distal to proximal with the exception of the proximal margin which was underexpanded.  As noted above a 3.5 balloon was used to further expand  the stent.  RECOMMENDATION:   Aggrastat for 10 hours.  Continue Plavix.  Critical care medicine to manage the patient's ventilator.  Continue IV Versed and fentanyl for sedation while on the ventilator.  Discussed the situation with family.  Do not anticipate discharge for several days.  IV hydration and tract kidney function.     Patient Profile     77 y.o. female with abnormal coronary CT, CKD stage III, mild carotid stenosis, hypothyroidism, GERD, NSCSL status post right upper lobectomy/XRT who initially presented for elective cardiac catheterization on 3/14 and had a DES placed in the RCA. There wasan apparent dissection versus thrombosis following the initial cath that resulted in repeat cath and intubation with associated A. fib arrest. A second stent was placed overlapping the distal of the previous stent in the RCA with grade 3 TIMI flow observed following placement of the first and second stents.    Assessment & Plan    1. Acute GI bleed. Most likely upper tract with tarry stools. Hgb dropped from 9.1 to 5.7 in 24 hours. Still actively stooling. Was given one unit of PRBCs last pm. BP stable. Having stomach pain. Did have EGD in 2012 with gastritis and hiatal hernia. Will transfuse additional 2 units of PRBCs now. Keep NPO. Consult GI for upper endoscopy. Unable to stop ASA and Plavix at this time due to high risk for stent thrombosis.  2. Iron deficiency anemia due to acute blood loss. Initially during hospital stay blood loss attributed due to bleeding during/post cath procedures. Now with acute GI bleeding. 3. CAD s/p PCI/stenting of the proximal RCA. Subsequent acute vessel reocclusion with thrombosis distal to stent- most likely due to edge dissection. Reocclusion  complicated by Vfib arrest requiring CPR. Additional DES placed distal to the first stent. On ASA and Plavix.  4. HTN BP stable this am. Will hold antihypertensives today in setting of acute GI bleed. Monitor.  5. Hypercholesterolemia on high dose statin 6. COPD- continue inhaler therapy. 7. Chest wall pain/flail sternum due to CPR. Improving.  8. History of [place] lung CA s/p RUL lobectomy 2009. XRT to LUL nodule 2013. XRT for lymphadenopathy 2015. S/p Tarceva and thermal ablation in 2016.   For questions or updates, please contact [place] Please consult [email or web address] for contact info under Cardiology/STEMI.      Signed, Lalena Salas [place], MD  09/27/2017, 7:52 AM

## 2017-09-27 NOTE — Progress Notes (Signed)
Patient request for something to help her sleep. Ambien was given to her past 2 nights per order. Order expired. Awaiting call back. Paged MD x 2.

## 2017-09-27 NOTE — Progress Notes (Signed)
Noted GIB. Will hold ambulation today. Yves Dill CES, ACSM 9:23 AM 09/27/2017

## 2017-09-27 NOTE — Progress Notes (Signed)
Dr. [place] paged per patient request and at bedside to discuss plan of care.   Emelda Fear, RN

## 2017-09-27 NOTE — H&P (View-Only) (Signed)
Referring Provider:   Dr. Peter Martinique Primary Care Physician:  Wenda Low, MD Primary Gastroenterologist:  Dr. Oletta Lamas  Reason for Consultation: Melena and drop in hemoglobin  HPI: [name] is a 77 y.o. female who is 6 days status post cardiac arrest with ventricular fibrillation and CPR, which occurred after stenting of the right coronary artery followed by distal occlusion requiring further stenting.  The patient has been extubated and is maintained on aspirin and Plavix.    However, yesterday evening, she started having dark stools, associated with a rise in BUN from 15 (2 days ago) to 48 (this morning); her hemoglobin has dropped from 8.4 to 5.9 over the past 24 hours.  She is to receive 2 units of packed red cells today.  I do not believe there is any prior history of ulcer disease or GI bleeding.  Last October, she underwent upper endoscopy with balloon dilatation of an esophageal ring to 15 mm by Dr. Zenovia Jarred (a 5 cm hiatal hernia was present, and moderate gastritis was present); last April, she had colonoscopy by Dr. Leonie Douglas for a positive Cologuard test, which revealed to moderate size colonic ulcerations, possibly medication induced or ischemic in character.  The patient has requested DNR status and had to be "talked into" undergoing GI evaluation because she is so "weary" of medical testing.    There is a remote history of lung cancer.         Past Medical History:  Diagnosis Date  . Anemia 09/2017  . Arthritis    back.and feet  . Cervical ca ([place]) dx'd 1988   surg only  . CKD (chronic kidney disease), stage III ([place])   . Complication of anesthesia    difficulty awakening, dysrhythmia post surgery, (prolonged sedation level)  . Coronary artery calcification seen on CT scan   . Dyspnea   . GERD (gastroesophageal reflux disease)   . Headache    occ. sinus type headaches  . History of hiatal hernia   . Hx of radiation therapy 09/16/13-09/30/13   prevascular lymph node  . Hyperlipidemia   . Hypertension   . Hyperthyroidism    dr Loanne Drilling- radioactive iodine treatment 2011- resolved  . Hypothyroidism   . LBBB (left bundle branch block)   . Lung cancer ([place])    Bilaterally, RUL lobectomy and spot involving both lungs- "cancer"  . Macular degeneration    bilateral  . Melanoma ([place])    left leg  . Radiation 10/11/11-10/21/11   Left upper lobe adenocarcinoma  . Tremor    hand- intention tremor    Past Surgical History:  Procedure Laterality Date  . basal cell and squamous cell skin cell area removed     new left lower eye lid done with skin graft   . CARDIAC CATHETERIZATION  09/21/2017  . CATARACT EXTRACTION, BILATERAL Bilateral   . COLONOSCOPY WITH PROPOFOL N/A 10/27/2016   Procedure: COLONOSCOPY WITH PROPOFOL;  Surgeon: Laurence Spates, MD;  Location: [facility];  Service: Endoscopy;  Laterality: N/A;  . conization for cervical dysplasia    . CORONARY STENT INTERVENTION N/A 09/21/2017   Procedure: CORONARY STENT INTERVENTION;  Surgeon: Belva Crome, MD;  Location: [facility];  Service: Cardiovascular;  Laterality: N/A;  . CORONARY/GRAFT ACUTE MI REVASCULARIZATION N/A 09/21/2017   Procedure: Coronary/Graft Acute MI Revascularization;  Surgeon: Belva Crome, MD;  Location: [place] [facility];  Service: Cardiovascular;  Laterality: N/A;  . esophogus stretching    . INTRAVASCULAR ULTRASOUND/IVUS  N/A 09/21/2017   Procedure: Intravascular Ultrasound/IVUS;  Surgeon: Belva Crome, MD;  Location: [place];  Service: Cardiovascular;  Laterality: N/A;  . IR GENERIC HISTORICAL  09/11/2014   IR RADIOLOGIST EVAL & MGMT 09/11/2014 Jacqulynn Cadet, MD [facility] INTERV RAD  . LEFT HEART CATH AND CORONARY ANGIOGRAPHY N/A 09/21/2017   Procedure: LEFT HEART CATH AND CORONARY ANGIOGRAPHY;  Surgeon: Belva Crome, MD;  Location: [place];  Service: Cardiovascular;  Laterality: N/A;  . LEFT HEART CATH AND CORONARY ANGIOGRAPHY  N/A 09/21/2017   Procedure: LEFT HEART CATH AND CORONARY ANGIOGRAPHY;  Surgeon: Belva Crome, MD;  Location: [facility];  Service: Cardiovascular;  Laterality: N/A;  . radio frequency ablation on lung spot    . rul lobectomy  2010  . TONSILLECTOMY    . TONSILLECTOMY      Prior to Admission medications   Medication Sig Start Date End Date Taking? Authorizing Provider  acetaminophen (TYLENOL) 650 MG CR tablet Take 1,300 mg by mouth 2 (two) times daily.    Yes [provider]  aspirin EC 81 MG tablet Take 1 tablet (81 mg total) by mouth daily. 03/25/16  Yes Dunn, Dayna N, PA-C  atenolol (TENORMIN) 25 MG tablet TAKE 1 TABLET(25 MG) BY MOUTH TWICE DAILY 10/12/16  Yes Dorothy Spark, MD  Calcium Carbonate-Vitamin D3 (CALCIUM 600-D) 600-400 MG-UNIT TABS Take 2 tablets by mouth daily.    Yes [provider]  diltiazem (CARDIZEM CD) 240 MG 24 hr capsule Take 240 mg by mouth daily.   Yes [provider]  ferrous sulfate 325 (65 FE) MG tablet Take 325 mg by mouth daily with breakfast.   Yes [provider]  fluticasone (FLONASE) 50 MCG/ACT nasal spray Place 1 spray into both nostrils daily as needed for allergies.    Yes [provider]  gabapentin (NEURONTIN) 300 MG capsule Take 600 mg by mouth at bedtime.  07/31/14  Yes [provider]  hydrochlorothiazide (HYDRODIURIL) 25 MG tablet Take 25 mg by mouth daily.   Yes [provider]  levothyroxine (SYNTHROID, LEVOTHROID) 100 MCG tablet TAKE 1 TABLET(100 MCG) BY MOUTH DAILY BEFORE BREAKFAST 09/13/17  Yes Dorothy Spark, MD  Multiple Vitamin (MULTIVITAMIN WITH MINERALS) TABS tablet Take 1 tablet by mouth daily.   Yes [provider]  Multiple Vitamins-Minerals (PRESERVISION AREDS 2 PO) Take 1 capsule by mouth 2 (two) times daily.   Yes [provider]  nitroGLYCERIN (NITROSTAT) 0.4 MG SL tablet Place 1 tablet (0.4 mg total) under the tongue every 5 (five) minutes as  needed for chest pain. 09/20/17 12/19/17 Yes Bhagat, Bhavinkumar, PA  pantoprazole (PROTONIX) 40 MG tablet Take 40 mg by mouth daily.  07/12/16  Yes [provider]  pravastatin (PRAVACHOL) 20 MG tablet Take 1 tablet (20 mg total) by mouth every evening. 09/07/17 12/06/17 Yes Dorothy Spark, MD    Current Facility-Administered Medications  Medication Dose Route Frequency Provider Last Rate Last Dose  . acetaminophen (TYLENOL) tablet 1,000 mg  1,000 mg Oral TID PRN Reino Bellis B, NP   1,000 mg at 09/26/17 1242  . albuterol (PROVENTIL) (2.5 MG/3ML) 0.083% nebulizer solution 2.5 mg  2.5 mg Nebulization Q4H PRN Minor, Grace Bushy, NP   2.5 mg at 09/24/17 1833  . alum & mag hydroxide-simeth (MAALOX/MYLANTA) 200-200-20 MG/5ML suspension 30 mL  30 mL Oral Q6H PRN Belva Crome, MD   30 mL at 09/26/17 1126  . arformoterol (BROVANA) nebulizer  solution 15 mcg  15 mcg Nebulization BID Simonne Maffucci B, MD   15 mcg at 09/27/17 0850  . aspirin chewable tablet 81 mg  81 mg Oral Daily Chesley Mires, MD   81 mg at 09/27/17 0820  . atorvastatin (LIPITOR) tablet 80 mg  80 mg Oral q1800 Belva Crome, MD   80 mg at 09/26/17 1710  . bisacodyl (DULCOLAX) suppository 10 mg  10 mg Rectal Daily PRN Jennelle Human B, NP      . budesonide (PULMICORT) nebulizer solution 0.5 mg  0.5 mg Nebulization BID Simonne Maffucci B, MD   0.5 mg at 09/27/17 0850  . calcium carbonate (TUMS - dosed in mg elemental calcium) chewable tablet 200 mg of elemental calcium  1 tablet Oral Daily PRN Reino Bellis B, NP   200 mg of elemental calcium at 09/26/17 1710  . calcium-vitamin D (OSCAL WITH D) 500-200 MG-UNIT per tablet 2 tablet  2 tablet Oral Daily Belva Crome, MD   2 tablet at 09/27/17 [phone]  . clopidogrel (PLAVIX) tablet 75 mg  75 mg Oral Q breakfast Belva Crome, MD   75 mg at 09/27/17 0818  . ferrous sulfate tablet 325 mg  325 mg Oral Q breakfast Belva Crome, MD   325 mg at 09/27/17 0818  . gabapentin (NEURONTIN)  capsule 600 mg  600 mg Oral QHS Belva Crome, MD   600 mg at 09/26/17 2120  . hydrALAZINE (APRESOLINE) injection 10 mg  10 mg Intravenous Q6H PRN Consuelo Pandy, PA-C   10 mg at 09/25/17 1949  . levothyroxine (SYNTHROID, LEVOTHROID) tablet 100 mcg  100 mcg Oral QAC breakfast Belva Crome, MD   100 mcg at 09/27/17 7564  . lidocaine (LIDODERM) 5 % 1 patch  1 patch Transdermal Q24H Skeet Latch, MD   1 patch at 09/27/17 [phone]  . MEDLINE mouth rinse  15 mL Mouth Rinse BID Chesley Mires, MD   15 mL at 09/24/17 1000  . multivitamin with minerals tablet 1 tablet  1 tablet Oral Daily Belva Crome, MD   1 tablet at 09/27/17 5188  . nitroGLYCERIN (NITROSTAT) SL tablet 0.4 mg  0.4 mg Sublingual Q5 min PRN Belva Crome, MD      . ondansetron [facility]) injection 4 mg  4 mg Intravenous Q6H PRN Belva Crome, MD   4 mg at 09/27/17 1004  . pantoprazole (PROTONIX) injection 40 mg  40 mg Intravenous Q24H Martinique, Peter M, MD   40 mg at 09/27/17 1129    Allergies as of 09/20/2017 - Review Complete 09/20/2017  Allergen Reaction Noted  . Meperidine hcl Other (See Comments)   . Penicillins Anaphylaxis, Shortness Of Breath, and Other (See Comments)   . Morphine Other (See Comments)   . Oxycodone-acetaminophen Itching   . Propoxyphene n-acetaminophen Nausea And Vomiting   . Tramadol hcl Nausea And Vomiting     Family History  Problem Relation Age of Onset  . Asthma Mother   . Heart disease Mother   . Thyroid disease Daughter        hypothyroidism  . Cancer Maternal Uncle        lung  . Cancer Maternal Grandfather        lung  . Diabetes Neg Hx   . Coronary artery disease Neg Hx     Social History   Socioeconomic History  . Marital status: Widowed    Spouse name: Not on file  . Number of children:  2  . Years of education: Not on file  . Highest education level: Not on file  Social Needs  . Financial resource strain: Not on file  . Food insecurity - worry: Not on file  . Food  insecurity - inability: Not on file  . Transportation needs - medical: Not on file  . Transportation needs - non-medical: Not on file  Occupational History  . Occupation: homemaker    Employer: RETIRED  Tobacco Use  . Smoking status: Former Smoker    Packs/day: 1.00    Years: 17.00    Pack years: 17.00    Types: Cigarettes    Last attempt to quit: 09/28/1975    Years since quitting: 42.0  . Smokeless tobacco: Never Used  . Tobacco comment: 33 yrs ago  Substance and Sexual Activity  . Alcohol use: Yes    Comment: glass wine daily  . Drug use: No  . Sexual activity: Not on file  Other Topics Concern  . Not on file  Social History Narrative   [employer] graduate   Married 1963   4 grandchildren and 2 step-grandchildren   Marriage in good health            Physician Roster:   Oncologist- Dr Julien Nordmann   Surgeon- Dr Lindell Spar- Dr Mable Fill- Dr Allyson Sabal    Review of Systems: LV function was normal on recent catheterization  Physical Exam: Vital signs in last 24 hours: Temp:  [97.6 F (36.4 C)-98.5 F (36.9 C)] 97.8 F (36.6 C) (03/20 1524) Pulse Rate:  [85-102] 102 (03/20 1524) Resp:  [15-22] 21 (03/20 1524) BP: (102-131)/(44-67) 121/59 (03/20 1524) SpO2:  [92 %-96 %] 94 % (03/20 1524) Weight:  [62.2 kg (137 lb 2 oz)] 62.2 kg (137 lb 2 oz) (03/19 2104) Last BM Date: 09/27/17(pt had watery black stool. Pt and bed linens cleaned up)  The patient is a reasonably healthy appearing, well preserved Caucasian female who does appear somewhat "worn out" but is not in acute distress.  She is anicteric and has a somewhat sallow complexion.  Chest clear anteriorly, heart without murmur or arrhythmia, abdomen without overt mass-effect or tenderness.  The patient seems to be cognitively intact and without overt focal neurologic deficits.  Intake/Output from previous day: 03/19 0701 - 03/20 0700 In: 180 [P.O.:120; Blood:60] Out: -  Intake/Output this shift: Total I/O In: 270  [P.O.:240; Blood:30] Out: -   Lab Results: Recent Labs    09/26/17 0218 09/26/17 2019 09/27/17 0034 09/27/17 1431  WBC 9.7  --  14.5*  --   HGB 8.4* 6.0* 5.7* 6.6*  HCT 26.1* 19.0* 18.7* 19.9*  PLT 213  --  253  --    BMET Recent Labs    09/25/17 0359 09/26/17 0218 09/27/17 0034  NA 138 137 137  K 3.4* 5.0 4.4  CL 100* 102 105  CO2 29 27 26   GLUCOSE 111* 106* 152*  BUN 15 31* 48*  CREATININE 0.82 0.81 0.92  CALCIUM 9.1 8.8* 8.9   LFT No results for input(s): PROT, ALBUMIN, AST, ALT, ALKPHOS, BILITOT, BILIDIR, IBILI in the last 72 hours. PT/INR No results for input(s): LABPROT, INR in the last 72 hours.  Studies/Results: No results found.  Impression: Subacute GI bleed with acute posthemorrhagic anemia, while on aspirin and Plavix following recent cardiac arrest and coronary artery stent.  Plan: Proceed to endoscopy tomorrow.  The purpose and risks of the procedure were reviewed with the patient and her  husband and sister at the bedside.  She is agreeable to proceed.  I have spoken with Dr. Peter Martinique of cardiology, who is in favor of the procedure and has cleared her for it.   LOS: 6 days   Harinder Romas V  09/27/2017, 4:41 PM   Pager [phone] If no answer or after 5 PM call [phone]

## 2017-09-27 NOTE — Progress Notes (Signed)
Pt refuses blood transfusion at this time. Pt has been educated on benefits of infusion. Pt continues to state that she "just wants to die". Pt states that she understands the benefits. Dr. Martinique has paged pt's son, but has not heard back. Will continue to monitor and support pt.   Grant Fontana BSN, RN

## 2017-09-27 NOTE — Progress Notes (Signed)
PT Cancellation Note  Patient Details Name: [name] MRN: [id number] DOB: [birth year]   Cancelled Treatment:    Reason Eval/Treat Not Completed: (P) Medical issues which prohibited therapy(Pt with low HGB, tranfused x1 unit but labs are not redrawn yet. Will f/u pending labs and medical appropriateness.  )   [name] 09/27/2017, 2:31 PM  Governor Rooks, PTA pager [phone]

## 2017-09-27 NOTE — Progress Notes (Signed)
Unit of [facility] infusing at this time. Tylenol and benadryl administered, per order. 15 minute vitals and assessment complete. Pt in bed with no complaints. Family at bedside. Will continue to monitor.    Grant Fontana BSN, RN

## 2017-09-27 NOTE — Consult Note (Signed)
Referring Provider:   Dr. Peter Martinique Primary Care Physician:  Wenda Low, MD Primary Gastroenterologist:  Dr. Oletta Lamas  Reason for Consultation: Melena and drop in hemoglobin  HPI: KORTNE ALL is a 77 y.o. female who is 6 days status post cardiac arrest with ventricular fibrillation and CPR, which occurred after stenting of the right coronary artery followed by distal occlusion requiring further stenting.  The patient has been extubated and is maintained on aspirin and Plavix.    However, yesterday evening, she started having dark stools, associated with a rise in BUN from 15 (2 days ago) to 48 (this morning); her hemoglobin has dropped from 8.4 to 5.9 over the past 24 hours.  She is to receive 2 units of packed red cells today.  I do not believe there is any prior history of ulcer disease or GI bleeding.  Last October, she underwent upper endoscopy with balloon dilatation of an esophageal ring to 15 mm by Dr. Zenovia Jarred (a 5 cm hiatal hernia was present, and moderate gastritis was present); last April, she had colonoscopy by Dr. Leonie Douglas for a positive Cologuard test, which revealed to moderate size colonic ulcerations, possibly medication induced or ischemic in character.  The patient has requested DNR status and had to be "talked into" undergoing GI evaluation because she is so "weary" of medical testing.    There is a remote history of lung cancer.         Past Medical History:  Diagnosis Date  . Anemia 09/2017  . Arthritis    back.and feet  . Cervical ca ([place]) dx'd 1988   surg only  . CKD (chronic kidney disease), stage III ([place])   . Complication of anesthesia    difficulty awakening, dysrhythmia post surgery, (prolonged sedation level)  . Coronary artery calcification seen on CT scan   . Dyspnea   . GERD (gastroesophageal reflux disease)   . Headache    occ. sinus type headaches  . History of hiatal hernia   . Hx of radiation therapy 09/16/13-09/30/13   prevascular lymph node  . Hyperlipidemia   . Hypertension   . Hyperthyroidism    dr Loanne Drilling- radioactive iodine treatment 2011- resolved  . Hypothyroidism   . LBBB (left bundle branch block)   . Lung cancer ([place])    Bilaterally, RUL lobectomy and spot involving both lungs- "cancer"  . Macular degeneration    bilateral  . Melanoma ([place])    left leg  . Radiation 10/11/11-10/21/11   Left upper lobe adenocarcinoma  . Tremor    hand- intention tremor    Past Surgical History:  Procedure Laterality Date  . basal cell and squamous cell skin cell area removed     new left lower eye lid done with skin graft   . CARDIAC CATHETERIZATION  09/21/2017  . CATARACT EXTRACTION, BILATERAL Bilateral   . COLONOSCOPY WITH PROPOFOL N/A 10/27/2016   Procedure: COLONOSCOPY WITH PROPOFOL;  Surgeon: Laurence Spates, MD;  Location: [facility];  Service: Endoscopy;  Laterality: N/A;  . conization for cervical dysplasia    . CORONARY STENT INTERVENTION N/A 09/21/2017   Procedure: CORONARY STENT INTERVENTION;  Surgeon: Belva Crome, MD;  Location: [facility];  Service: Cardiovascular;  Laterality: N/A;  . CORONARY/GRAFT ACUTE MI REVASCULARIZATION N/A 09/21/2017   Procedure: Coronary/Graft Acute MI Revascularization;  Surgeon: Belva Crome, MD;  Location: [facility];  Service: Cardiovascular;  Laterality: N/A;  . esophogus stretching    . INTRAVASCULAR ULTRASOUND/IVUS  N/A 09/21/2017   Procedure: Intravascular Ultrasound/IVUS;  Surgeon: Belva Crome, MD;  Location: [facility];  Service: Cardiovascular;  Laterality: N/A;  . IR GENERIC HISTORICAL  09/11/2014   IR RADIOLOGIST EVAL & MGMT 09/11/2014 Jacqulynn Cadet, MD [facility] INTERV RAD  . LEFT HEART CATH AND CORONARY ANGIOGRAPHY N/A 09/21/2017   Procedure: LEFT HEART CATH AND CORONARY ANGIOGRAPHY;  Surgeon: Belva Crome, MD;  Location: [facility];  Service: Cardiovascular;  Laterality: N/A;  . LEFT HEART CATH AND CORONARY ANGIOGRAPHY  N/A 09/21/2017   Procedure: LEFT HEART CATH AND CORONARY ANGIOGRAPHY;  Surgeon: Belva Crome, MD;  Location: [facility];  Service: Cardiovascular;  Laterality: N/A;  . radio frequency ablation on lung spot    . rul lobectomy  2010  . TONSILLECTOMY    . TONSILLECTOMY      Prior to Admission medications   Medication Sig Start Date End Date Taking? Authorizing Provider  acetaminophen (TYLENOL) 650 MG CR tablet Take 1,300 mg by mouth 2 (two) times daily.    Yes [provider]  aspirin EC 81 MG tablet Take 1 tablet (81 mg total) by mouth daily. 03/25/16  Yes Dunn, Dayna N, PA-C  atenolol (TENORMIN) 25 MG tablet TAKE 1 TABLET(25 MG) BY MOUTH TWICE DAILY 10/12/16  Yes Dorothy Spark, MD  Calcium Carbonate-Vitamin D3 (CALCIUM 600-D) 600-400 MG-UNIT TABS Take 2 tablets by mouth daily.    Yes [provider]  diltiazem (CARDIZEM CD) 240 MG 24 hr capsule Take 240 mg by mouth daily.   Yes [provider]  ferrous sulfate 325 (65 FE) MG tablet Take 325 mg by mouth daily with breakfast.   Yes [provider]  fluticasone (FLONASE) 50 MCG/ACT nasal spray Place 1 spray into both nostrils daily as needed for allergies.    Yes [provider]  gabapentin (NEURONTIN) 300 MG capsule Take 600 mg by mouth at bedtime.  07/31/14  Yes [provider]  hydrochlorothiazide (HYDRODIURIL) 25 MG tablet Take 25 mg by mouth daily.   Yes [provider]  levothyroxine (SYNTHROID, LEVOTHROID) 100 MCG tablet TAKE 1 TABLET(100 MCG) BY MOUTH DAILY BEFORE BREAKFAST 09/13/17  Yes Dorothy Spark, MD  Multiple Vitamin (MULTIVITAMIN WITH MINERALS) TABS tablet Take 1 tablet by mouth daily.   Yes [provider]  Multiple Vitamins-Minerals (PRESERVISION AREDS 2 PO) Take 1 capsule by mouth 2 (two) times daily.   Yes [provider]  nitroGLYCERIN (NITROSTAT) 0.4 MG SL tablet Place 1 tablet (0.4 mg total) under the tongue every 5 (five) minutes as  needed for chest pain. 09/20/17 12/19/17 Yes Bhagat, Bhavinkumar, PA  pantoprazole (PROTONIX) 40 MG tablet Take 40 mg by mouth daily.  07/12/16  Yes [provider]  pravastatin (PRAVACHOL) 20 MG tablet Take 1 tablet (20 mg total) by mouth every evening. 09/07/17 12/06/17 Yes Dorothy Spark, MD    Current Facility-Administered Medications  Medication Dose Route Frequency Provider Last Rate Last Dose  . acetaminophen (TYLENOL) tablet 1,000 mg  1,000 mg Oral TID PRN Reino Bellis B, NP   1,000 mg at 09/26/17 1242  . albuterol (PROVENTIL) (2.5 MG/3ML) 0.083% nebulizer solution 2.5 mg  2.5 mg Nebulization Q4H PRN Minor, Grace Bushy, NP   2.5 mg at 09/24/17 1833  . alum & mag hydroxide-simeth (MAALOX/MYLANTA) 200-200-20 MG/5ML suspension 30 mL  30 mL Oral Q6H PRN Belva Crome, MD   30 mL at 09/26/17 1126  . arformoterol (BROVANA) nebulizer  solution 15 mcg  15 mcg Nebulization BID Simonne Maffucci B, MD   15 mcg at 09/27/17 0850  . aspirin chewable tablet 81 mg  81 mg Oral Daily Chesley Mires, MD   81 mg at 09/27/17 0820  . atorvastatin (LIPITOR) tablet 80 mg  80 mg Oral q1800 Belva Crome, MD   80 mg at 09/26/17 1710  . bisacodyl (DULCOLAX) suppository 10 mg  10 mg Rectal Daily PRN Jennelle Human B, NP      . budesonide (PULMICORT) nebulizer solution 0.5 mg  0.5 mg Nebulization BID Simonne Maffucci B, MD   0.5 mg at 09/27/17 0850  . calcium carbonate (TUMS - dosed in mg elemental calcium) chewable tablet 200 mg of elemental calcium  1 tablet Oral Daily PRN Reino Bellis B, NP   200 mg of elemental calcium at 09/26/17 1710  . calcium-vitamin D (OSCAL WITH D) 500-200 MG-UNIT per tablet 2 tablet  2 tablet Oral Daily Belva Crome, MD   2 tablet at 09/27/17 [phone]  . clopidogrel (PLAVIX) tablet 75 mg  75 mg Oral Q breakfast Belva Crome, MD   75 mg at 09/27/17 0818  . ferrous sulfate tablet 325 mg  325 mg Oral Q breakfast Belva Crome, MD   325 mg at 09/27/17 0818  . gabapentin (NEURONTIN)  capsule 600 mg  600 mg Oral QHS Belva Crome, MD   600 mg at 09/26/17 2120  . hydrALAZINE (APRESOLINE) injection 10 mg  10 mg Intravenous Q6H PRN Consuelo Pandy, PA-C   10 mg at 09/25/17 1949  . levothyroxine (SYNTHROID, LEVOTHROID) tablet 100 mcg  100 mcg Oral QAC breakfast Belva Crome, MD   100 mcg at 09/27/17 9604  . lidocaine (LIDODERM) 5 % 1 patch  1 patch Transdermal Q24H Skeet Latch, MD   1 patch at 09/27/17 [phone]  . MEDLINE mouth rinse  15 mL Mouth Rinse BID Chesley Mires, MD   15 mL at 09/24/17 1000  . multivitamin with minerals tablet 1 tablet  1 tablet Oral Daily Belva Crome, MD   1 tablet at 09/27/17 8119  . nitroGLYCERIN (NITROSTAT) SL tablet 0.4 mg  0.4 mg Sublingual Q5 min PRN Belva Crome, MD      . ondansetron [facility]) injection 4 mg  4 mg Intravenous Q6H PRN Belva Crome, MD   4 mg at 09/27/17 1004  . pantoprazole (PROTONIX) injection 40 mg  40 mg Intravenous Q24H Martinique, Peter M, MD   40 mg at 09/27/17 1129    Allergies as of 09/20/2017 - Review Complete 09/20/2017  Allergen Reaction Noted  . Meperidine hcl Other (See Comments)   . Penicillins Anaphylaxis, Shortness Of Breath, and Other (See Comments)   . Morphine Other (See Comments)   . Oxycodone-acetaminophen Itching   . Propoxyphene n-acetaminophen Nausea And Vomiting   . Tramadol hcl Nausea And Vomiting     Family History  Problem Relation Age of Onset  . Asthma Mother   . Heart disease Mother   . Thyroid disease Daughter        hypothyroidism  . Cancer Maternal Uncle        lung  . Cancer Maternal Grandfather        lung  . Diabetes Neg Hx   . Coronary artery disease Neg Hx     Social History   Socioeconomic History  . Marital status: Widowed    Spouse name: Not on file  . Number of children:  2  . Years of education: Not on file  . Highest education level: Not on file  Social Needs  . Financial resource strain: Not on file  . Food insecurity - worry: Not on file  . Food  insecurity - inability: Not on file  . Transportation needs - medical: Not on file  . Transportation needs - non-medical: Not on file  Occupational History  . Occupation: homemaker    Employer: RETIRED  Tobacco Use  . Smoking status: Former Smoker    Packs/day: 1.00    Years: 17.00    Pack years: 17.00    Types: Cigarettes    Last attempt to quit: 09/28/1975    Years since quitting: 42.0  . Smokeless tobacco: Never Used  . Tobacco comment: 33 yrs ago  Substance and Sexual Activity  . Alcohol use: Yes    Comment: glass wine daily  . Drug use: No  . Sexual activity: Not on file  Other Topics Concern  . Not on file  Social History Narrative   [employer] graduate   Married 1963   4 grandchildren and 2 step-grandchildren   Marriage in good health            Physician Roster:   Oncologist- Dr Julien Nordmann   Surgeon- Dr Lindell Spar- Dr Mable Fill- Dr Allyson Sabal    Review of Systems: LV function was normal on recent catheterization  Physical Exam: Vital signs in last 24 hours: Temp:  [97.6 F (36.4 C)-98.5 F (36.9 C)] 97.8 F (36.6 C) (03/20 1524) Pulse Rate:  [85-102] 102 (03/20 1524) Resp:  [15-22] 21 (03/20 1524) BP: (102-131)/(44-67) 121/59 (03/20 1524) SpO2:  [92 %-96 %] 94 % (03/20 1524) Weight:  [62.2 kg (137 lb 2 oz)] 62.2 kg (137 lb 2 oz) (03/19 2104) Last BM Date: 09/27/17(pt had watery black stool. Pt and bed linens cleaned up)  The patient is a reasonably healthy appearing, well preserved Caucasian female who does appear somewhat "worn out" but is not in acute distress.  She is anicteric and has a somewhat sallow complexion.  Chest clear anteriorly, heart without murmur or arrhythmia, abdomen without overt mass-effect or tenderness.  The patient seems to be cognitively intact and without overt focal neurologic deficits.  Intake/Output from previous day: 03/19 0701 - 03/20 0700 In: 180 [P.O.:120; Blood:60] Out: -  Intake/Output this shift: Total I/O In: 270  [P.O.:240; Blood:30] Out: -   Lab Results: Recent Labs    09/26/17 0218 09/26/17 2019 09/27/17 0034 09/27/17 1431  WBC 9.7  --  14.5*  --   HGB 8.4* 6.0* 5.7* 6.6*  HCT 26.1* 19.0* 18.7* 19.9*  PLT 213  --  253  --    BMET Recent Labs    09/25/17 0359 09/26/17 0218 09/27/17 0034  NA 138 137 137  K 3.4* 5.0 4.4  CL 100* 102 105  CO2 29 27 26   GLUCOSE 111* 106* 152*  BUN 15 31* 48*  CREATININE 0.82 0.81 0.92  CALCIUM 9.1 8.8* 8.9   LFT No results for input(s): PROT, ALBUMIN, AST, ALT, ALKPHOS, BILITOT, BILIDIR, IBILI in the last 72 hours. PT/INR No results for input(s): LABPROT, INR in the last 72 hours.  Studies/Results: No results found.  Impression: Subacute GI bleed with acute posthemorrhagic anemia, while on aspirin and Plavix following recent cardiac arrest and coronary artery stent.  Plan: Proceed to endoscopy tomorrow.  The purpose and risks of the procedure were reviewed with the patient and her  husband and sister at the bedside.  She is agreeable to proceed.  I have spoken with Dr. Peter Martinique of cardiology, who is in favor of the procedure and has cleared her for it.   LOS: 6 days   Keevin Panebianco V  09/27/2017, 4:41 PM   Pager [phone] If no answer or after 5 PM call [phone]

## 2017-09-27 NOTE — Progress Notes (Signed)
Pt is receiving 1 unit of blood. Pt's VSS. No complaints of fever, chills, aches. Patient is resting. Call bell within reach. Will continue to monitor.  Fransico Michael, RN

## 2017-09-27 NOTE — Progress Notes (Signed)
OT Cancellation Note  Patient Details Name: [name] MRN: [id number] DOB: 12/06/1940   Cancelled Treatment:    Reason Eval/Treat Not Completed: Medical issues which prohibited therapy(Hb of 5.7). OT will continue to follow for evaluation as schedule allows.   Beatty 09/27/2017, 9:09 AM  Hulda Humphrey OTR/L [phone]

## 2017-09-28 ENCOUNTER — Inpatient Hospital Stay (HOSPITAL_COMMUNITY): Payer: Medicare Other | Admitting: Anesthesiology

## 2017-09-28 ENCOUNTER — Encounter (HOSPITAL_COMMUNITY)
Admission: RE | Disposition: A | Discharge status: Skilled Nursing Facility | Payer: Self-pay | Source: Ambulatory Visit | Attending: Interventional Cardiology

## 2017-09-28 ENCOUNTER — Encounter (HOSPITAL_COMMUNITY): Payer: Self-pay | Admitting: *Deleted

## 2017-09-28 HISTORY — PX: ESOPHAGOGASTRODUODENOSCOPY (EGD) WITH PROPOFOL: SHX5813

## 2017-09-28 LAB — CBC
HCT: 27.3 % — ABNORMAL LOW (ref 36.0–46.0)
HEMOGLOBIN: 9.2 g/dL — AB (ref 12.0–15.0)
MCH: 28.5 pg (ref 26.0–34.0)
MCHC: 33.7 g/dL (ref 30.0–36.0)
MCV: 84.5 fL (ref 78.0–100.0)
PLATELETS: 196 10*3/uL (ref 150–400)
RBC: 3.23 MIL/uL — AB (ref 3.87–5.11)
RDW: 15.3 % (ref 11.5–15.5)
WBC: 15.3 10*3/uL — ABNORMAL HIGH (ref 4.0–10.5)

## 2017-09-28 SURGERY — ESOPHAGOGASTRODUODENOSCOPY (EGD) WITH PROPOFOL
Anesthesia: Monitor Anesthesia Care

## 2017-09-28 MED ORDER — PROPOFOL 10 MG/ML IV BOLUS
INTRAVENOUS | Status: DC | PRN
  Administered 2017-09-28: 20 mg via INTRAVENOUS

## 2017-09-28 MED ORDER — PANTOPRAZOLE SODIUM 40 MG IV SOLR: 40.0000 mg | INTRAVENOUS | Status: DC

## 2017-09-28 MED ORDER — PHENYLEPHRINE HCL 10 MG/ML IJ SOLN
INTRAMUSCULAR | Status: DC | PRN
  Administered 2017-09-28: 80 ug via INTRAVENOUS

## 2017-09-28 MED ORDER — ZOLPIDEM TARTRATE 5 MG PO TABS
5.0000 mg | ORAL_TABLET | Freq: Every evening | ORAL | Status: DC | PRN
  Administered 2017-09-28 – 2017-10-16 (×20): 5 mg via ORAL
  Filled 2017-09-28 (×20): qty 1

## 2017-09-28 MED ORDER — PROPOFOL 500 MG/50ML IV EMUL
INTRAVENOUS | Status: DC | PRN
  Administered 2017-09-28: 75 ug/kg/min via INTRAVENOUS

## 2017-09-28 MED ORDER — SODIUM CHLORIDE 0.9 % IV SOLN: INTRAVENOUS | Status: DC

## 2017-09-28 MED ORDER — LACTATED RINGERS IV SOLN
INTRAVENOUS | Status: DC | PRN
  Administered 2017-09-28: 10:00:00 via INTRAVENOUS

## 2017-09-28 MED ORDER — ACETAMINOPHEN 500 MG PO TABS
1000.0000 mg | ORAL_TABLET | Freq: Two times a day (BID) | ORAL | Status: DC
  Administered 2017-09-28 – 2017-10-17 (×37): 1000 mg via ORAL
  Filled 2017-09-28 (×36): qty 2

## 2017-09-28 MED ORDER — ASPIRIN EC 81 MG PO TBEC
81.0000 mg | DELAYED_RELEASE_TABLET | Freq: Every day | ORAL | Status: DC
  Administered 2017-09-28 – 2017-10-06 (×9): 81 mg via ORAL
  Filled 2017-09-28 (×8): qty 1

## 2017-09-28 SURGICAL SUPPLY — 15 items

## 2017-09-28 NOTE — Progress Notes (Signed)
PT Cancellation Note  Patient Details Name: CLESSIE KARRAS MRN: [id number] DOB: [birth year]   Cancelled Treatment:    Reason Eval/Treat Not Completed: Fatigue/lethargy limiting ability to participate, pt stating "I'm not getting out of bed today" despite max encouragement. Will follow-up for PT treatment tomorrow.  Mabeline Caras, PT, DPT Acute Rehab Services  Pager: [facility] 09/28/2017, 2:31 PM

## 2017-09-28 NOTE — Anesthesia Preprocedure Evaluation (Addendum)
Anesthesia Evaluation  Patient identified by MRN, date of birth, ID band Patient awake    Reviewed: Allergy & Precautions, H&P , NPO status , Patient's Chart, lab work & pertinent test results, reviewed documented beta blocker date and time   Airway Mallampati: III  TM Distance: >3 FB Neck ROM: Full    Dental no notable dental hx. (+) Teeth Intact, Dental Advisory Given   Pulmonary neg pulmonary ROS, former smoker,    Pulmonary exam normal breath sounds clear to auscultation       Cardiovascular hypertension, Pt. on medications and Pt. on home beta blockers + Peripheral Vascular Disease  + dysrhythmias  Rhythm:Regular Rate:Normal     Neuro/Psych  Headaches, negative psych ROS   GI/Hepatic Neg liver ROS, GERD  ,  Endo/Other  Hypothyroidism   Renal/GU Renal disease  negative genitourinary   Musculoskeletal  (+) Arthritis , Osteoarthritis,    Abdominal   Peds  Hematology negative hematology ROS (+) anemia ,   Anesthesia Other Findings   Reproductive/Obstetrics negative OB ROS                            Anesthesia Physical Anesthesia Plan  ASA: III  Anesthesia Plan: MAC   Post-op Pain Management:    Induction: Intravenous  PONV Risk Score and Plan: 2 and Propofol infusion and Treatment may vary due to age or medical condition  Airway Management Planned: Nasal Cannula  Additional Equipment:   Intra-op Plan:   Post-operative Plan:   Informed Consent: I have reviewed the patients History and Physical, chart, labs and discussed the procedure including the risks, benefits and alternatives for the proposed anesthesia with the patient or authorized representative who has indicated his/her understanding and acceptance.   Dental advisory given  Plan Discussed with: CRNA  Anesthesia Plan Comments:         Anesthesia Quick Evaluation

## 2017-09-28 NOTE — Anesthesia Postprocedure Evaluation (Signed)
Anesthesia Post Note  Patient: [name]  Procedure(s) Performed: ESOPHAGOGASTRODUODENOSCOPY (EGD) WITH PROPOFOL (N/A )     Patient location during evaluation: PACU Anesthesia Type: MAC Level of consciousness: awake and alert Pain management: pain level controlled Vital Signs Assessment: post-procedure vital signs reviewed and stable Respiratory status: spontaneous breathing, nonlabored ventilation and respiratory function stable Cardiovascular status: stable and blood pressure returned to baseline Postop Assessment: no apparent nausea or vomiting Anesthetic complications: no    Last Vitals:  Vitals Value Taken Time  BP 156/49 09/28/2017 11:21 AM  Temp    Pulse 107 09/28/2017 11:22 AM  Resp 21 09/28/2017 11:22 AM  SpO2 91 % 09/28/2017 11:22 AM  Vitals shown include unvalidated device data.  Last Pain:  Vitals:   09/28/17 1120  TempSrc:   PainSc: 4                  Jahna Liebert,W. EDMOND

## 2017-09-28 NOTE — Transfer of Care (Signed)
Immediate Anesthesia Transfer of Care Note  Patient: [name]  Procedure(s) Performed: ESOPHAGOGASTRODUODENOSCOPY (EGD) WITH PROPOFOL (N/A )  Patient Location: Endoscopy Unit  Anesthesia Type:MAC  Level of Consciousness: awake, alert  and oriented  Airway & Oxygen Therapy: Patient Spontanous Breathing and Patient connected to nasal cannula oxygen  Post-op Assessment: Report given to RN, Post -op Vital signs reviewed and stable and Patient moving all extremities X 4  Post vital signs: Reviewed and stable  Last Vitals:  Vitals Value Taken Time  BP    Temp    Pulse    Resp    SpO2      Last Pain:  Vitals:   09/28/17 0838  TempSrc: Oral  PainSc: 6       Patients Stated Pain Goal: 2 (20/35/59 7416)  Complications: No apparent anesthesia complications

## 2017-09-28 NOTE — Progress Notes (Signed)
OT Cancellation Note  Patient Details Name: [name] MRN: [id number] DOB: 04/27/1941   Cancelled Treatment:    Reason Eval/Treat Not Completed: Patient at procedure or test/ unavailable. Pt leaving for EGD.   Binnie Kand M.S., OTR/L Pager: [phone]  09/28/2017, 8:19 AM

## 2017-09-28 NOTE — Progress Notes (Addendum)
OT Cancellation Note  Patient Details Name: [name] MRN: [id number] DOB: 07-02-1941   Cancelled Treatment:    Reason Eval/Treat Not Completed: Fatigue/lethargy limiting ability to participate. 2nd attempt at OT eval today. Will follow up as time allows.  Binnie Kand M.S., OTR/L Pager: [phone]  09/28/2017, 3:19 PM

## 2017-09-28 NOTE — Op Note (Signed)
[facility] Patient Name: [name] Procedure Date : 09/28/2017 MRN: [id number] Attending MD: Arta Silence , MD Date of Birth: 1940/09/07 CSN: [id number] Age: 77 Admit Type: Inpatient Procedure:                Upper GI endoscopy Indications:              Acute post hemorrhagic anemia, Melena Providers:                Arta Silence, MD, Cleda Daub, RN, Laurena Spies, Technician Referring MD:              Medicines:                Monitored Anesthesia Care Complications:            No immediate complications. Estimated Blood Loss:     Estimated blood loss was minimal. Procedure:                Pre-Anesthesia Assessment:                           - Prior to the procedure, a History and Physical                            was performed, and patient medications and                            allergies were reviewed. The patient's tolerance of                            previous anesthesia was also reviewed. The risks                            and benefits of the procedure and the sedation                            options and risks were discussed with the patient.                            All questions were answered, and informed consent                            was obtained. Prior Anticoagulants: The patient has                            taken Plavix (clopidogrel), last dose was day of                            procedure. ASA Grade Assessment: IV - A patient                            with severe systemic disease that is a constant  threat to life. After reviewing the risks and                            benefits, the patient was deemed in satisfactory                            condition to undergo the procedure.                           After obtaining informed consent, the endoscope was                            passed under direct vision. Throughout the                            procedure, the  patient's blood pressure, pulse, and                            oxygen saturations were monitored continuously. The                            [id number] ([id number]) scope was introduced through the                            mouth, and advanced to the second part of duodenum.                            The upper GI endoscopy was accomplished without                            difficulty. The patient tolerated the procedure                            well. Scope In: Scope Out: Findings:      A medium-sized hiatal hernia was present.      One mild benign-appearing, intrinsic stenosis was found. Otherwise       normal esophagus; specifically, no esophageal varices or Mallory-Weiss       tear was seen.      Old and fresh blood and clots in gravity-dependent portion of stomach.       With time, clots removed and the stomach in toto was examined. Old scar       in body of stomach (old PEG site? other?), seen on endoscopy years ago.       Had near this old scar a 77mm cratered ulcer with no overlying clot,       visible vessel or active bleeding. Pre-pyloric ulcer cratered       clean-based about 75mm in diameter also seen, again no visible vessel,       adherent clot or active bleeding. After clearing stomach, no further       blood pooling was seen. Two cratered gastric ulcers were found in the       gastric body and in the prepyloric region of the stomach.      The exam of the stomach was otherwise normal.      The duodenal bulb,  first portion of the duodenum and second portion of       the duodenum were normal. Impression:               - Medium-sized hiatal hernia.                           - Benign-appearing esophageal stenosis.                           - Gastric ulcers.                           - Normal duodenal bulb, first portion of the                            duodenum and second portion of the duodenum.                           - Bleeding likely from gastric ulcers. No high-risk                             stigmata observed. Moderate Sedation:      None Recommendation:           - Return patient to hospital ward for ongoing care.                           - Clear liquid diet today. If Hgb stable and no                            further bleeding, consider advance diet tomorrow.                           - Continue present medications. Will need PPI now                            and indefinitely.                           - Follow CBCs.                           [facility] will follow. Procedure Code(s):        --- Professional ---                           [phone], Esophagogastroduodenoscopy, flexible,                            transoral; diagnostic, including collection of                            specimen(s) by brushing or washing, when performed                            (separate procedure) Diagnosis Code(s):        --- Professional ---  K44.9, Diaphragmatic hernia without obstruction or                            gangrene                           K22.2, Esophageal obstruction                           K25.9, Gastric ulcer, unspecified as acute or                            chronic, without hemorrhage or perforation                           D62, Acute posthemorrhagic anemia                           K92.1, Melena (includes Hematochezia) CPT copyright 2016 American [employer] Association. All rights reserved. The codes documented in this report are preliminary and upon coder review may  be revised to meet current compliance requirements. Arta Silence, MD 09/28/2017 11:12:29 AM This report has been signed electronically. Number of Addenda: 0

## 2017-09-28 NOTE — Anesthesia Procedure Notes (Signed)
Procedure Name: MAC Date/Time: 09/28/2017 10:05 AM Performed by: Harden Mo, CRNA Pre-anesthesia Checklist: Patient identified, Emergency Drugs available, Suction available and Patient being monitored Patient Re-evaluated:Patient Re-evaluated prior to induction Oxygen Delivery Method: Nasal cannula Preoxygenation: Pre-oxygenation with 100% oxygen Induction Type: IV induction Placement Confirmation: positive ETCO2 and breath sounds checked- equal and bilateral Dental Injury: Teeth and Oropharynx as per pre-operative assessment

## 2017-09-28 NOTE — Interval H&P Note (Signed)
History and Physical Interval Note:  09/28/2017 9:56 AM  [name]  has presented today for surgery, with the diagnosis of melena  The various methods of treatment have been discussed with the patient and family. After consideration of risks, benefits and other options for treatment, the patient has consented to  Procedure(s): ESOPHAGOGASTRODUODENOSCOPY (EGD) WITH PROPOFOL (N/A) as a surgical intervention .  The patient's history has been reviewed, patient examined, no change in status, stable for surgery.  I have reviewed the patient's chart and labs.  Questions were answered to the patient's satisfaction.     [name]

## 2017-09-28 NOTE — Progress Notes (Signed)
PT Cancellation Note  Patient Details Name: [name] MRN: [id number] DOB: [birth year]   Cancelled Treatment:    Reason Eval/Treat Not Completed: Patient at procedure or test/unavailable. Off floor for EGD. Will follow-up for PT treatment as schedule permits.  Mabeline Caras, PT, DPT Acute Rehab Services  Pager: [place] 09/28/2017, 9:15 AM

## 2017-09-28 NOTE — Progress Notes (Signed)
Transport here to take patient for EGD at this time.

## 2017-09-28 NOTE — Progress Notes (Signed)
Progress Note  Patient Name: [name] Date of Encounter: 09/28/2017  Primary Cardiologist: Ena Dawley MD  Subjective   Looks and feels much better today. Denies any dyspnea. No further stools. Sternum still sore but improving.   Inpatient Medications    Scheduled Meds: . arformoterol  15 mcg Nebulization BID  . aspirin  81 mg Oral Daily  . atorvastatin  80 mg Oral q1800  . budesonide (PULMICORT) nebulizer solution  0.5 mg Nebulization BID  . calcium-vitamin D  2 tablet Oral Daily  . clopidogrel  75 mg Oral Q breakfast  . ferrous sulfate  325 mg Oral Q breakfast  . gabapentin  600 mg Oral QHS  . levothyroxine  100 mcg Oral QAC breakfast  . lidocaine  1 patch Transdermal Q24H  . mouth rinse  15 mL Mouth Rinse BID  . multivitamin with minerals  1 tablet Oral Daily  . pantoprazole (PROTONIX) IV  40 mg Intravenous Q24H   Continuous Infusions: . sodium chloride     PRN Meds: acetaminophen, albuterol, alum & mag hydroxide-simeth, bisacodyl, calcium carbonate, hydrALAZINE, nitroGLYCERIN, ondansetron (ZOFRAN) IV, zolpidem   Vital Signs    Vitals:   09/27/17 2014 09/28/17 0000 09/28/17 0400 09/28/17 0750  BP:  136/68 (!) 125/49 (!) 137/57  Pulse:    100  Resp:  18 17 18   Temp:   98.4 F (36.9 C) 98.2 F (36.8 C)  TempSrc:   Oral Oral  SpO2: 99% 95% 94% 99%  Weight:      Height:        Intake/Output Summary (Last 24 hours) at 09/28/2017 0818 Last data filed at 09/28/2017 5284 Gross per 24 hour  Intake 675 ml  Output 1100 ml  Net -425 ml   Filed Weights   09/21/17 0608 09/22/17 0500 09/26/17 2104  Weight: 140 lb (63.5 kg) 143 lb 15.4 oz (65.3 kg) 137 lb 2 oz (62.2 kg)    Telemetry    NSR, occ PVC - Personally Reviewed  ECG    None today - Personally Reviewed  Physical Exam   GEN: thin WF color is good.  Neck: No JVD Chest: anterior chest still flail with inspiration. Bruised.  Cardiac: RRR, no murmurs, rubs, or gallops.  Respiratory: Clear  to auscultation bilaterally. GI: Soft, nontender, non-distended watery black stool in bed.  MS: No edema; No deformity. Neuro:  Nonfocal  Psych: Normal affect   Labs    Chemistry Recent Labs  Lab 09/21/17 2138  09/25/17 0359 09/26/17 0218 09/27/17 0034  NA 139   < > 138 137 137  K 4.4   < > 3.4* 5.0 4.4  CL 109   < > 100* 102 105  CO2 22   < > 29 27 26   GLUCOSE 158*   < > 111* 106* 152*  BUN 27*   < > 15 31* 48*  CREATININE 1.37*   < > 0.82 0.81 0.92  CALCIUM 8.1*   < > 9.1 8.8* 8.9  ALBUMIN 2.4*  --   --   --   --   GFRNONAA 36*   < > >60 >60 59*  GFRAA 42*   < > >60 >60 >60  ANIONGAP 8   < > 9 8 6    < > = values in this interval not displayed.     Hematology Recent Labs  Lab 09/26/17 0218  09/27/17 0034 09/27/17 1431 09/28/17 0246  WBC 9.7  --  14.5*  --  15.3*  RBC 3.00*  --  2.14*  --  3.23*  HGB 8.4*   < > 5.7* 6.6* 9.2*  HCT 26.1*   < > 18.7* 19.9* 27.3*  MCV 87.0  --  87.4  --  84.5  MCH 28.0  --  26.6  --  28.5  MCHC 32.2  --  30.5  --  33.7  RDW 17.1*  --  17.5*  --  15.3  PLT 213  --  253  --  196   < > = values in this interval not displayed.    Cardiac Enzymes Recent Labs  Lab 09/22/17 0118 09/22/17 0413 09/22/17 1237  TROPONINI 7.60* 9.17* 6.25*   No results for input(s): TROPIPOC in the last 168 hours.   BNP Recent Labs  Lab 09/23/17 0215  BNP 326.8*     DDimer No results for input(s): DDIMER in the last 168 hours.   Radiology    No results found.  Cardiac Studies   Procedures   CORONARY STENT INTERVENTION  LEFT HEART CATH AND CORONARY ANGIOGRAPHY  Conclusion    Segmental proximal to mid 90% RCA stenosis.  This lesion was treated with stenting using overlapping Synergy 2.5 mm stents to postdilated to 3 mm in diameter with TIMI grade III flow.  0% stenosis was noted post procedure.  Ostial 50-60% circumflex narrowing.  Luminal irregularities throughout the proximal to mid LAD.  Very distal eccentric tandem 70% stenoses  near the apex.  No focally high-grade stenosis is noted.  Normal left main  Low normal LV systolic function in the 76-19% range.  Upper normal left ventricular end-diastolic pressure of 17 mmHg.  RECOMMENDATIONS:   Aspirin and Plavix for at least 6 months in the setting of chronic stable ischemic heart disease.  Given overlapping stents it would be preferable to continue dual antiplatelet therapy for 12 months.  Aggressive risk factor modification using high intensity statin therapy for as long as tolerated.  I have started atorvastatin 80 mg daily and discontinued pravastatin.  Blood pressure control with target 130/80 mmHg or less.  Phase 2 cardiac rehab.  Overnight stay since the patient lives alone, is relatively frail, has had some bleeding from the radial cath site.   Procedures   Coronary/Graft Acute MI Revascularization  Intravascular Ultrasound/IVUS  LEFT HEART CATH AND CORONARY ANGIOGRAPHY  Conclusion    Acute occlusion of the mid right coronary distal to the previously placed stents from earlier in the day.  Acute occlusion was accompanied by ventricular fibrillation.  The patient was successfully resuscitated.  Successful recanalization of the right coronary and placement of an additional stent overlapping the distal margin of the previously placed stent.  Final stent diameter 3.25 mm throughout the entire stented segment from distal to proximal.  3.5 mm diameter [place] balloon was used at the very proximal stent margin after intravascular ultrasound demonstrated decreased apposition.  TIMI grade III flow with no evidence of stenosis postprocedure.  Intravascular ultrasound was used and demonstrated acceptable stent apposition from distal to proximal with the exception of the proximal margin which was underexpanded.  As noted above a 3.5 balloon was used to further expand the stent.  RECOMMENDATION:   Aggrastat for 10 hours.  Continue Plavix.  Critical care medicine  to manage the patient's ventilator.  Continue IV Versed and fentanyl for sedation while on the ventilator.  Discussed the situation with family.  Do not anticipate discharge for several days.  IV hydration and tract kidney function.  Patient Profile     77 y.o. female with abnormal coronary CT, CKD stage III, mild carotid stenosis, hypothyroidism, GERD, NSCSL status post right upper lobectomy/XRT who initially presented for elective cardiac catheterization on 3/14 and had a DES placed in the RCA. There wasan apparent dissection versus thrombosis following the initial cath that resulted in repeat cath and intubation with associated A. fib arrest. A second stent was placed overlapping the distal of the previous stent in the RCA with grade 3 TIMI flow observed following placement of the first and second stents.    Assessment & Plan    1. Acute GI bleed. Most likely upper tract with tarry stools. Hgb dropped from 9.1 to 5.7 in 24 hours. No further bowel movements last night. Hgb improved to 9.2 post transfusion of 3 units PRBCs yesterday. Appreciate GI input. Plan for upper EGD today.  Unable to stop ASA and Plavix at this time due to high risk for stent thrombosis. On IV protonix now.  2. Iron deficiency anemia due to acute blood loss. Initially during hospital stay blood loss attributed due to bleeding during/post cath procedures. Now with acute GI bleeding. 3. CAD s/p PCI/stenting of the proximal RCA. Subsequent acute vessel reocclusion with thrombosis distal to stent- most likely due to edge dissection. Reocclusion complicated by Vfib arrest requiring CPR. Additional DES placed distal to the first stent. On ASA and Plavix.  4. HTN BP stable.. Antihypertensives held in setting of acute GI bleed. Monitor. Can resume medication once BP and bleeding stabilized.  5. Hypercholesterolemia on high dose statin 6. COPD- continue inhaler therapy. 7. Chest wall pain/flail sternum due to CPR.  Improving.  8. History of [place] lung CA s/p RUL lobectomy 2009. XRT to LUL nodule 2013. XRT for lymphadenopathy 2015. S/p Tarceva and thermal ablation in 2016.   For questions or updates, please contact [place] Please consult [email or web address] for contact info under Cardiology/STEMI.      Signed, Peter Martinique, MD  09/28/2017, 8:18 AM

## 2017-09-28 NOTE — Consult Note (Signed)
[facility] CM Primary Care Navigator  09/28/2017  [name] May 20, 1941 [id number]  Met with patientat the bedside to identify possible discharge needs. Patientreports having "increased shortness of breath and extreme fatigue" that led to this admission. (status post cardiac catheterization with stents)  Patient endorses [name] with [facility] as herprimary care provider.   Patient shared using [name] to obtain medications without difficulty.  Patientverbalized managing herownmedications at home with use of "pill containers" filled as soon as emptied.  Patientreportsthatshe was driving prior to admission but her children ([name] and [name]) canprovidetransportation to her doctors'appointments after discharge. Patient mentioned that she knows a transportation company in case needed for future use.  According to patient,her sister [name]) and brother [name]) are here for a week or so, to assist with her needs. Patient also mentioned that she is familiar with [facility] [employer] (knows the owner) in case needed for assistance with care at home after discharge.  Anticipated discharge plan is home with home health services per patient.  Patient voiced understanding to call primary care provider's office whenshe returns home for a post discharge follow-up appointment within 1- 2 weeks or sooner if needed.Patient letter (with PCP's contact number) was provided as a reminder.  Discussed withpatientabout [facility] CM services available for healthmanagement at home. Patient statesthatshe is managing well at home,so far, without current needs or concerns at this time. She mentioned thatCOPD has not been a pressing issue for her and managing it with breathing treatments and close follow-up with providers.  Patient expressed understanding to seekreferral to [facility] care management from primary care provider if  deemed necessary andappropriate for any services in the near future. [facility] care management information provided for future needs thatshe may have.   Patientwould onlyopt and verbally agree for [facility] calls to follow-up with herrecovery at home.   Referral made for [facility] calls after discharge.   For additional questions please contact:  [name], BSN, RN-BC [facility] PRIMARY CARE Navigator Cell: [phone]

## 2017-09-29 LAB — CBC
HEMATOCRIT: 23.6 % — AB (ref 36.0–46.0)
Hemoglobin: 7.7 g/dL — ABNORMAL LOW (ref 12.0–15.0)
MCH: 29.4 pg (ref 26.0–34.0)
MCHC: 32.6 g/dL (ref 30.0–36.0)
MCV: 90.1 fL (ref 78.0–100.0)
PLATELETS: 259 10*3/uL (ref 150–400)
RBC: 2.62 MIL/uL — ABNORMAL LOW (ref 3.87–5.11)
RDW: 17.8 % — ABNORMAL HIGH (ref 11.5–15.5)
WBC: 16.8 10*3/uL — AB (ref 4.0–10.5)

## 2017-09-29 LAB — PREPARE RBC (CROSSMATCH)

## 2017-09-29 MED ORDER — PANTOPRAZOLE SODIUM 40 MG PO TBEC
40.0000 mg | DELAYED_RELEASE_TABLET | Freq: Every day | ORAL | Status: DC
  Administered 2017-09-29: 40 mg via ORAL
  Filled 2017-09-29: qty 1

## 2017-09-29 MED ORDER — SODIUM CHLORIDE 0.9 % IV SOLN
INTRAVENOUS | Status: DC
  Administered 2017-09-29: 16:00:00 via INTRAVENOUS

## 2017-09-29 MED ORDER — METOPROLOL TARTRATE 25 MG PO TABS
25.0000 mg | ORAL_TABLET | Freq: Two times a day (BID) | ORAL | Status: DC
  Administered 2017-09-29 – 2017-10-01 (×5): 25 mg via ORAL
  Filled 2017-09-29 (×5): qty 1

## 2017-09-29 MED ORDER — DIPHENHYDRAMINE HCL 50 MG/ML IJ SOLN
25.0000 mg | Freq: Once | INTRAMUSCULAR | Status: AC
  Administered 2017-09-29: 25 mg via INTRAVENOUS
  Filled 2017-09-29: qty 1

## 2017-09-29 MED ORDER — SODIUM CHLORIDE 0.9 % IV SOLN
Freq: Once | INTRAVENOUS | Status: AC
  Administered 2017-10-02: 18:00:00 via INTRAVENOUS

## 2017-09-29 MED ORDER — ACETAMINOPHEN 325 MG PO TABS
650.0000 mg | ORAL_TABLET | Freq: Once | ORAL | Status: AC
  Administered 2017-09-29: 650 mg via ORAL
  Filled 2017-09-29: qty 2

## 2017-09-29 MED ORDER — SODIUM CHLORIDE 0.9 % IV SOLN
8.0000 mg/h | INTRAVENOUS | Status: DC
  Administered 2017-09-29 – 2017-10-02 (×5): 8 mg/h via INTRAVENOUS
  Filled 2017-09-29 (×10): qty 80

## 2017-09-29 MED ORDER — PANTOPRAZOLE SODIUM 40 MG IV SOLR
40.0000 mg | Freq: Two times a day (BID) | INTRAVENOUS | Status: DC
Start: 2017-10-02 — End: 2017-10-02

## 2017-09-29 MED ORDER — SODIUM CHLORIDE 0.9 % IV SOLN
80.0000 mg | Freq: Once | INTRAVENOUS | Status: AC
  Administered 2017-09-29: 80 mg via INTRAVENOUS
  Filled 2017-09-29: qty 80

## 2017-09-29 NOTE — Care Management Important Message (Signed)
Important Message  Patient Details  Name: [name] MRN: [id number] Date of Birth: Jun 16, 1941   Medicare Important Message Given:  Yes    Maleka Contino P Farrel Guimond 09/29/2017, 2:45 PM

## 2017-09-29 NOTE — Care Management Note (Signed)
Case Management Note Marvetta Gibbons RN, BSN Unit 4E-Case Manager- [place] coverage [phone]  Patient Details  Name: [name] MRN: [id number] Date of Birth: [birth year]  Subjective/Objective:  Pt admitted with CAD s/p PCI with stents                  Action/Plan: PTA pt lived at home home alone, anticipate return home, plan for asa/plavix- CM to follow of transition of care needs.   Expected Discharge Date:                  Expected Discharge Plan:  Skilled Nursing Facility  In-House Referral:  Clinical Social Work  Discharge planning Services  CM Consult  Post Acute Care Choice:    Choice offered to:     DME Arranged:    DME Agency:     HH Arranged:    [place] Agency:     Status of Service:  In process, will continue to follow  If discussed at Long Length of Stay Meetings, dates discussed:    Discharge Disposition:   Additional Comments:  09/29/17- 1620- Marvetta Gibbons RN, CM, - pt with GIB post procedure- s/p EGD and transfusions, PT/OT evaluations completed with recommendations for SNF- CSW has been consulted for possible SNF placement  Dahlia Client Romeo Rabon, RN 09/29/2017, 4:26 PM

## 2017-09-29 NOTE — Progress Notes (Signed)
Subjective: Patient seen and examined at bedside. Reports that she had another black stool today morning.  Objective: Vital signs in last 24 hours: Temp:  [97.6 F (36.4 C)-98.3 F (36.8 C)] 98.3 F (36.8 C) (03/22 1257) Pulse Rate:  [93-123] 93 (03/22 1257) Resp:  [17-24] 20 (03/22 1257) BP: (126-153)/(62-75) 143/66 (03/22 1257) SpO2:  [94 %-98 %] 96 % (03/22 1257) Weight change:  Last BM Date: 09/28/17  [id number] weak and frail GENERAL:prominent pallor ABDOMEN:soft, nondistended, nontender, normoactive bowel sounds EXTREMITIES:no deformity  Lab Results: Results for orders placed or performed during the hospital encounter of 09/21/17 (from the past 48 hour(s))  Hemoglobin and hematocrit, blood     Status: Abnormal   Collection Time: 09/27/17  2:31 PM  Result Value Ref Range   Hemoglobin 6.6 (LL) 12.0 - 15.0 g/dL    Comment: REPEATED TO VERIFY CRITICAL VALUE NOTED.  VALUE IS CONSISTENT WITH PREVIOUSLY REPORTED AND CALLED VALUE.    HCT 19.9 (L) 36.0 - 46.0 %    Comment: Performed at [facility], [place] [address] [place], [place] [address]  CBC     Status: Abnormal   Collection Time: 09/28/17  2:46 AM  Result Value Ref Range   WBC 15.3 (H) 4.0 - 10.5 K/uL   RBC 3.23 (L) 3.87 - 5.11 MIL/uL   Hemoglobin 9.2 (L) 12.0 - 15.0 g/dL    Comment: DELTA CHECK NOTED POST TRANSFUSION SPECIMEN    HCT 27.3 (L) 36.0 - 46.0 %   MCV 84.5 78.0 - 100.0 fL   MCH 28.5 26.0 - 34.0 pg   MCHC 33.7 30.0 - 36.0 g/dL   RDW 15.3 11.5 - 15.5 %   Platelets 196 150 - 400 K/uL    Comment: Performed at [facility], [place] [address] [place], [place] [address]  CBC     Status: Abnormal   Collection Time: 09/29/17 10:19 AM  Result Value Ref Range   WBC 16.8 (H) 4.0 - 10.5 K/uL   RBC 2.62 (L) 3.87 - 5.11 MIL/uL   Hemoglobin 7.7 (L) 12.0 - 15.0 g/dL   HCT 23.6 (L) 36.0 - 46.0 %   MCV 90.1 78.0 - 100.0 fL   MCH 29.4 26.0 - 34.0 pg   MCHC 32.6 30.0 - 36.0 g/dL   RDW 17.8 (H) 11.5 - 15.5  %   Platelets 259 150 - 400 K/uL    Comment: Performed at [facility], [place] [address], [place], [place] [address]  Prepare RBC     Status: None   Collection Time: 09/29/17 11:38 AM  Result Value Ref Range   Order Confirmation      ORDER PROCESSED BY BLOOD BANK Performed at [facility], [place] [address], [place], [place] [address]     Studies/Results: No results found.  Medications: I have reviewed the patient's current medications.  Assessment: UGI bleeding-EGD showed gastric ulcers and old blood. Hemoglobin dropped from 9.2-7.7 today, and patient reports one melanotic stool today. Blood pressure remained stable but patient is mildly tachycardic. So far has received 5 units of PRBC.  Patient is 8 days status post cardiac arrest, ventricular fibrillation and CPR, right coronary artery occlusion requiring further stenting and maintained on aspirin and Plavix.  Plan: Had a long discussion with the patient, her son and daughter at bedside. Due to recent cardiac events, patient cannot stop aspirin and Plavix as she is at high risk for stent thrombosis.  Patient to be kept on clear  liquid diet and nothing by mouth post midnight for re-endoscopy in a.m. as further drop in hemoglobin and melanotic stool likely signifies ongoing bleeding. Patient will be started on Protonix drip at 8 mg per hour IV.     Ronnette Juniper 09/29/2017, 2:04 PM   Pager [phone] If no answer or after 5 PM call [phone]

## 2017-09-29 NOTE — Plan of Care (Signed)
  Problem: Education: Goal: Knowledge of General Education information will improve Outcome: Progressing   Problem: Health Behavior/Discharge Planning: Goal: Ability to manage health-related needs will improve Outcome: Progressing   Problem: Clinical Measurements: Goal: Ability to maintain clinical measurements within normal limits will improve Outcome: Progressing Goal: Will remain free from infection Outcome: Progressing Goal: Diagnostic test results will improve Outcome: Progressing Goal: Respiratory complications will improve Outcome: Progressing Goal: Cardiovascular complication will be avoided Outcome: Progressing   Problem: Activity: Goal: Risk for activity intolerance will decrease Outcome: Progressing   Problem: Nutrition: Goal: Adequate nutrition will be maintained Outcome: Progressing   Problem: Coping: Goal: Level of anxiety will decrease Outcome: Progressing   Problem: Elimination: Goal: Will not experience complications related to bowel motility Outcome: Progressing Goal: Will not experience complications related to urinary retention Outcome: Progressing   Problem: Pain Managment: Goal: General experience of comfort will improve Outcome: Progressing   Problem: Safety: Goal: Ability to remain free from injury will improve Outcome: Progressing   Problem: Skin Integrity: Goal: Risk for impaired skin integrity will decrease Outcome: Progressing   Problem: Education: Goal: Understanding of CV disease, CV risk reduction, and recovery process will improve Outcome: Progressing   Problem: Activity: Goal: Ability to return to baseline activity level will improve Outcome: Progressing   Problem: Cardiovascular: Goal: Ability to achieve and maintain adequate cardiovascular perfusion will improve Outcome: Progressing Goal: Vascular access site(s) Level 0-1 will be maintained Outcome: Progressing   Problem: Health Behavior/Discharge Planning: Goal:  Ability to safely manage health-related needs after discharge will improve Outcome: Progressing

## 2017-09-29 NOTE — Progress Notes (Signed)
Progress Note  Patient Name: [name] Date of Encounter: 09/29/2017  Primary Cardiologist: Ena Dawley MD  Subjective   Looks and feels much better today. In good spirits.  Denies any dyspnea. One small stool.  Sternum still very sore.  Inpatient Medications    Scheduled Meds: . acetaminophen  1,000 mg Oral BID  . arformoterol  15 mcg Nebulization BID  . aspirin EC  81 mg Oral Daily  . atorvastatin  80 mg Oral q1800  . budesonide (PULMICORT) nebulizer solution  0.5 mg Nebulization BID  . calcium-vitamin D  2 tablet Oral Daily  . clopidogrel  75 mg Oral Q breakfast  . ferrous sulfate  325 mg Oral Q breakfast  . gabapentin  600 mg Oral QHS  . levothyroxine  100 mcg Oral QAC breakfast  . lidocaine  1 patch Transdermal Q24H  . mouth rinse  15 mL Mouth Rinse BID  . metoprolol tartrate  25 mg Oral BID  . multivitamin with minerals  1 tablet Oral Daily  . pantoprazole  40 mg Oral Daily   Continuous Infusions:  PRN Meds: acetaminophen, albuterol, alum & mag hydroxide-simeth, bisacodyl, calcium carbonate, hydrALAZINE, nitroGLYCERIN, ondansetron (ZOFRAN) IV, zolpidem   Vital Signs    Vitals:   09/29/17 0013 09/29/17 0506 09/29/17 0800 09/29/17 0847  BP: (!) 141/66 126/71    Pulse: (!) 117 (!) 112 (!) 123   Resp: (!) 24 17    Temp: 97.8 F (36.6 C) 98 F (36.7 C) 98.1 F (36.7 C)   TempSrc: Oral Oral Oral   SpO2: 94% 96%  98%  Weight:      Height:        Intake/Output Summary (Last 24 hours) at 09/29/2017 1005 Last data filed at 09/29/2017 0800 Gross per 24 hour  Intake 1658.79 ml  Output 1002 ml  Net 656.79 ml   Filed Weights   09/21/17 0608 09/22/17 0500 09/26/17 2104  Weight: 140 lb (63.5 kg) 143 lb 15.4 oz (65.3 kg) 137 lb 2 oz (62.2 kg)    Telemetry    Sinus tachycardia - Personally Reviewed  ECG    None today - Personally Reviewed  Physical Exam   GEN: thin WF color is good.  Neck: No JVD Chest: anterior chest still flail with  inspiration. Bruised.  Cardiac: RRR, no murmurs, rubs, or gallops.  Respiratory: Clear to auscultation bilaterally. GI: Soft, nontender, non-distended watery black stool in bed.  MS: No edema; No deformity. Neuro:  Nonfocal  Psych: Normal affect   Labs    Chemistry Recent Labs  Lab 09/25/17 0359 09/26/17 0218 09/27/17 0034  NA 138 137 137  K 3.4* 5.0 4.4  CL 100* 102 105  CO2 29 27 26   GLUCOSE 111* 106* 152*  BUN 15 31* 48*  CREATININE 0.82 0.81 0.92  CALCIUM 9.1 8.8* 8.9  GFRNONAA >60 >60 59*  GFRAA >60 >60 >60  ANIONGAP 9 8 6      Hematology Recent Labs  Lab 09/26/17 0218  09/27/17 0034 09/27/17 1431 09/28/17 0246  WBC 9.7  --  14.5*  --  15.3*  RBC 3.00*  --  2.14*  --  3.23*  HGB 8.4*   < > 5.7* 6.6* 9.2*  HCT 26.1*   < > 18.7* 19.9* 27.3*  MCV 87.0  --  87.4  --  84.5  MCH 28.0  --  26.6  --  28.5  MCHC 32.2  --  30.5  --  33.7  RDW 17.1*  --  17.5*  --  15.3  PLT 213  --  253  --  196   < > = values in this interval not displayed.    Cardiac Enzymes Recent Labs  Lab 09/22/17 1237  TROPONINI 6.25*   No results for input(s): TROPIPOC in the last 168 hours.   BNP Recent Labs  Lab 09/23/17 0215  BNP 326.8*     DDimer No results for input(s): DDIMER in the last 168 hours.   Radiology    No results found.  Cardiac Studies   Procedures   CORONARY STENT INTERVENTION  LEFT HEART CATH AND CORONARY ANGIOGRAPHY  Conclusion    Segmental proximal to mid 90% RCA stenosis.  This lesion was treated with stenting using overlapping Synergy 2.5 mm stents to postdilated to 3 mm in diameter with TIMI grade III flow.  0% stenosis was noted post procedure.  Ostial 50-60% circumflex narrowing.  Luminal irregularities throughout the proximal to mid LAD.  Very distal eccentric tandem 70% stenoses near the apex.  No focally high-grade stenosis is noted.  Normal left main  Low normal LV systolic function in the 01-75% range.  Upper normal left ventricular  end-diastolic pressure of 17 mmHg.  RECOMMENDATIONS:   Aspirin and Plavix for at least 6 months in the setting of chronic stable ischemic heart disease.  Given overlapping stents it would be preferable to continue dual antiplatelet therapy for 12 months.  Aggressive risk factor modification using high intensity statin therapy for as long as tolerated.  I have started atorvastatin 80 mg daily and discontinued pravastatin.  Blood pressure control with target 130/80 mmHg or less.  Phase 2 cardiac rehab.  Overnight stay since the patient lives alone, is relatively frail, has had some bleeding from the radial cath site.   Procedures   Coronary/Graft Acute MI Revascularization  Intravascular Ultrasound/IVUS  LEFT HEART CATH AND CORONARY ANGIOGRAPHY  Conclusion    Acute occlusion of the mid right coronary distal to the previously placed stents from earlier in the day.  Acute occlusion was accompanied by ventricular fibrillation.  The patient was successfully resuscitated.  Successful recanalization of the right coronary and placement of an additional stent overlapping the distal margin of the previously placed stent.  Final stent diameter 3.25 mm throughout the entire stented segment from distal to proximal.  3.5 mm diameter [place] balloon was used at the very proximal stent margin after intravascular ultrasound demonstrated decreased apposition.  TIMI grade III flow with no evidence of stenosis postprocedure.  Intravascular ultrasound was used and demonstrated acceptable stent apposition from distal to proximal with the exception of the proximal margin which was underexpanded.  As noted above a 3.5 balloon was used to further expand the stent.  RECOMMENDATION:   Aggrastat for 10 hours.  Continue Plavix.  Critical care medicine to manage the patient's ventilator.  Continue IV Versed and fentanyl for sedation while on the ventilator.  Discussed the situation with family.  Do not  anticipate discharge for several days.  IV hydration and tract kidney function.     Patient Profile     77 y.o. female with abnormal coronary CT, CKD stage III, mild carotid stenosis, hypothyroidism, GERD, NSCSL status post right upper lobectomy/XRT who initially presented for elective cardiac catheterization on 3/14 and had a DES placed in the RCA. There wasan apparent dissection versus thrombosis following the initial cath that resulted in repeat cath and intubation with associated A. fib arrest. A second stent was placed overlapping the distal of  the previous stent in the RCA with grade 3 TIMI flow observed following placement of the first and second stents.    Assessment & Plan    1. Acute GI bleed. Findings of gastric ulcers without active bleeding.  Hgb dropped from 9.1 to 5.7 in 24 hours.  Hgb improved to 9.2 post transfusion of 3 units PRBCs. Will check CBC again. Appreciate GI input.  Unable to stop ASA and Plavix at this time due to high risk for stent thrombosis. Will switch  protonix to po now. Advance diet. 2. Iron deficiency anemia due to acute blood loss. Initially during hospital stay blood loss attributed due to bleeding during/post cath procedures. Then  with acute GI bleeding. 3. CAD s/p PCI/stenting of the proximal RCA. Subsequent acute vessel reocclusion with thrombosis distal to stent- most likely due to edge dissection. Reocclusion complicated by Vfib arrest requiring CPR. Additional DES placed distal to the first stent. On ASA and Plavix.  4. HTN BP stable. Antihypertensives held in setting of acute GI bleed. Monitor. Will resume metoprolol 25 mg bid now. 5. Hypercholesterolemia on high dose statin 6. COPD- continue inhaler therapy. 7. Chest wall pain/flail sternum due to CPR. Improving.  8. History of [place] lung CA s/p RUL lobectomy 2009. XRT to LUL nodule 2013. XRT for lymphadenopathy 2015. S/p Tarceva and thermal ablation in 2016.   Encourage patient to participate  in Rehab therapies today. Significantly deconditioned.   For questions or updates, please contact [place] Please consult [email or web address] for contact info under Cardiology/STEMI.      Signed, Peter Martinique, MD  09/29/2017, 10:05 AM

## 2017-09-29 NOTE — H&P (View-Only) (Signed)
Subjective: Patient seen and examined at bedside. Reports that she had another black stool today morning.  Objective: Vital signs in last 24 hours: Temp:  [97.6 F (36.4 C)-98.3 F (36.8 C)] 98.3 F (36.8 C) (03/22 1257) Pulse Rate:  [93-123] 93 (03/22 1257) Resp:  [17-24] 20 (03/22 1257) BP: (126-153)/(62-75) 143/66 (03/22 1257) SpO2:  [94 %-98 %] 96 % (03/22 1257) Weight change:  Last BM Date: 09/28/17  DV:VOHYWVP weak and frail GENERAL:prominent pallor ABDOMEN:soft, nondistended, nontender, normoactive bowel sounds EXTREMITIES:no deformity  Lab Results: Results for orders placed or performed during the hospital encounter of 09/21/17 (from the past 48 hour(s))  Hemoglobin and hematocrit, blood     Status: Abnormal   Collection Time: 09/27/17  2:31 PM  Result Value Ref Range   Hemoglobin 6.6 (LL) 12.0 - 15.0 g/dL    Comment: REPEATED TO VERIFY CRITICAL VALUE NOTED.  VALUE IS CONSISTENT WITH PREVIOUSLY REPORTED AND CALLED VALUE.    HCT 19.9 (L) 36.0 - 46.0 %    Comment: Performed at [facility], [place] [address], [place], [place] [address]  CBC     Status: Abnormal   Collection Time: 09/28/17  2:46 AM  Result Value Ref Range   WBC 15.3 (H) 4.0 - 10.5 K/uL   RBC 3.23 (L) 3.87 - 5.11 MIL/uL   Hemoglobin 9.2 (L) 12.0 - 15.0 g/dL    Comment: DELTA CHECK NOTED POST TRANSFUSION SPECIMEN    HCT 27.3 (L) 36.0 - 46.0 %   MCV 84.5 78.0 - 100.0 fL   MCH 28.5 26.0 - 34.0 pg   MCHC 33.7 30.0 - 36.0 g/dL   RDW 15.3 11.5 - 15.5 %   Platelets 196 150 - 400 K/uL    Comment: Performed at [facility], [place] [address] [place], [place] [address]  CBC     Status: Abnormal   Collection Time: 09/29/17 10:19 AM  Result Value Ref Range   WBC 16.8 (H) 4.0 - 10.5 K/uL   RBC 2.62 (L) 3.87 - 5.11 MIL/uL   Hemoglobin 7.7 (L) 12.0 - 15.0 g/dL   HCT 23.6 (L) 36.0 - 46.0 %   MCV 90.1 78.0 - 100.0 fL   MCH 29.4 26.0 - 34.0 pg   MCHC 32.6 30.0 - 36.0 g/dL   RDW 17.8 (H) 11.5 - 15.5  %   Platelets 259 150 - 400 K/uL    Comment: Performed at [facility], [place] [address], [place], [place] [address]  Prepare RBC     Status: None   Collection Time: 09/29/17 11:38 AM  Result Value Ref Range   Order Confirmation      ORDER PROCESSED BY BLOOD BANK Performed at [facility], [place] [address], [place], [place] [address]     Studies/Results: No results found.  Medications: I have reviewed the patient's current medications.  Assessment: UGI bleeding-EGD showed gastric ulcers and old blood. Hemoglobin dropped from 9.2-7.7 today, and patient reports one melanotic stool today. Blood pressure remained stable but patient is mildly tachycardic. So far has received 5 units of PRBC.  Patient is 8 days status post cardiac arrest, ventricular fibrillation and CPR, right coronary artery occlusion requiring further stenting and maintained on aspirin and Plavix.  Plan: Had a long discussion with the patient, her son and daughter at bedside. Due to recent cardiac events, patient cannot stop aspirin and Plavix as she is at high risk for stent thrombosis.  Patient to be kept on clear  liquid diet and nothing by mouth post midnight for re-endoscopy in a.m. as further drop in hemoglobin and melanotic stool likely signifies ongoing bleeding. Patient will be started on Protonix drip at 8 mg per hour IV.     Ronnette Juniper 09/29/2017, 2:04 PM   Pager [phone] If no answer or after 5 PM call [phone]

## 2017-09-29 NOTE — Evaluation (Signed)
Occupational Therapy Evaluation Patient Details Name: [name] MRN: [id number] DOB: May 26, 1941 Today's Date: 09/29/2017    History of Present Illness Pt is a 77 y.o. female admitted 09/21/17 for elective cardiac cath. There wasan apparent dissection versus thrombosis following the initial cath that resulted in repeat cath 3/15 and intubation with associated A-fib arrest requiring CPR; a second stent was placed. Pt with acute GI bleed, s/p EGD with propofol 3/21. PMH includes CKD III, HTN, COPD, non-small cell lung cancer (radiation 2013, 2015), arthritis.   Clinical Impression   This 77 y/o F presents with the above. At baseline pt is independent with ADLs and functional mobility; lives alone but with good family support available. Pt presenting with generalized weakness and decreased activity tolerance. Pt completing stand pivot transfer with minA using RW, though with increased fatigue after brief activity. Currently requires MaxA for LB ADLs. Pt will benefit from continued acute OT services and recommend SNF level OT services after discharge to maximize her overall safety and independence with ADLs and mobility prior to return home.     Follow Up Recommendations  SNF;Supervision/Assistance - 24 hour    Equipment Recommendations  Other (comment)(TBD in next venue )           Precautions / Restrictions Precautions Precautions: Fall Precaution Comments: Chest soreness/brusing post-CPR Restrictions Weight Bearing Restrictions: No      Mobility Bed Mobility Overal bed mobility: Needs Assistance Bed Mobility: Supine to Sit     Supine to sit: Min assist     General bed mobility comments: Increased time and effort with HOB slightly elevated, minA to assist trunk elevation  Transfers Overall transfer level: Needs assistance Equipment used: Rolling walker (2 wheeled) Transfers: Sit to/from [facility] Sit to Stand: Mod assist Stand pivot transfers: Min  assist       General transfer comment: Pt unable to stand with 3x attempt with instructions for hand placement and technique; able to stand with modA for trunk elevation and RW; pt taking small pivotal steps using RW to transfer to recliner, requires increased assist as pt with slight posterior lean     Balance Overall balance assessment: Needs assistance   Sitting balance-Leahy Scale: Fair Sitting balance - Comments: Hesitant to move without holding onto heart pillow     Standing balance-Leahy Scale: Poor Standing balance comment: Reliant on UE support and external assist from therapist                            ADL either performed or assessed with clinical judgement   ADL Overall ADL's : Needs assistance/impaired Eating/Feeding: Modified independent;Sitting   Grooming: Set up;Sitting   Upper Body Bathing: Min guard;Sitting   Lower Body Bathing: Sit to/from stand;Moderate assistance   Upper Body Dressing : Minimal assistance;Sitting Upper Body Dressing Details (indicate cue type and reason): donning second gown  Lower Body Dressing: Sit to/from stand;Maximal assistance Lower Body Dressing Details (indicate cue type and reason): total assist to don socks this session  Toilet Transfer: Minimal assistance;Stand-pivot;BSC;RW;+2 for safety/equipment Toilet Transfer Details (indicate cue type and reason): simulated in transfer EOB to recliner  Toileting- Clothing Manipulation and Hygiene: Minimal assistance;Sit to/from stand       Functional mobility during ADLs: Minimal assistance;Moderate assistance;Rolling walker(for stand pivot transfer ) General ADL Comments: pt with decreased activity tolerance and strength, impacting her overall functional performance; declined ADL completion this session (having already completed grooming ADLs earlier)  Pertinent Vitals/Pain Pain Assessment: Faces Faces Pain Scale: Hurts little more Pain  Location: Chest bruising Pain Descriptors / Indicators: Sore;Guarding Pain Intervention(s): Monitored during session          Extremity/Trunk Assessment Upper Extremity Assessment Upper Extremity Assessment: Generalized weakness   Lower Extremity Assessment Lower Extremity Assessment: Generalized weakness       Communication Communication Communication: No difficulties   Cognition Arousal/Alertness: Awake/alert Behavior During Therapy: Flat affect Overall Cognitive Status: Within Functional Limits for tasks assessed                                     General Comments  Sitting BP 158/78, standing BP 133/73, post-transfer BP 142/62               Home Living Family/patient expects to be discharged to:: Private residence Living Arrangements: Alone Available Help at Discharge: Family;Available 24 hours/day Type of Home: House Home Access: Stairs to enter [employer] of Steps: 1 Entrance Stairs-Rails: Right Home Layout: Two level;Able to live on main level with bedroom/bathroom     Bathroom Shower/Tub: Walk-in shower   Bathroom Toilet: Handicapped height     Home Equipment: Environmental consultant - 2 wheels;Walker - 4 wheels;Cane - single point;Bedside commode;Shower seat;Wheelchair - manual   Additional Comments: Pt lives alone but has family who can stay with her at d/c if needed      Prior Functioning/Environment Level of Independence: Independent        Comments: Indep. Drives        OT Problem List: Decreased strength;Impaired balance (sitting and/or standing);Decreased activity tolerance      OT Treatment/Interventions: Self-care/ADL training;DME and/or AE instruction;Therapeutic activities;Balance training;Therapeutic exercise;Energy conservation;Patient/family education    OT Goals(Current goals can be found in the care plan section) Acute Rehab OT Goals Patient Stated Goal: Return home OT Goal Formulation: With patient/family Time  For Goal Achievement: 10/13/17 Potential to Achieve Goals: Good  OT Frequency: Min 2X/week                             AM-PAC PT "6 Clicks" Daily Activity     Outcome Measure Help from another person eating meals?: None Help from another person taking care of personal grooming?: A Little Help from another person toileting, which includes using toliet, bedpan, or urinal?: A Lot Help from another person bathing (including washing, rinsing, drying)?: A Lot Help from another person to put on and taking off regular upper body clothing?: A Little Help from another person to put on and taking off regular lower body clothing?: A Lot 6 Click Score: 16   End of Session Equipment Utilized During Treatment: Gait belt;Rolling walker Nurse Communication: Mobility status  Activity Tolerance: Patient tolerated treatment well;Patient limited by fatigue Patient left: in chair;with call bell/phone within reach;with family/visitor present  OT Visit Diagnosis: Muscle weakness (generalized) (M62.81);Unsteadiness on feet (R26.81)                Time: 9030-0923 OT Time Calculation (min): 15 min Charges:  OT General Charges $OT Visit: 1 Visit OT Evaluation $OT Eval Moderate Complexity: 1 Mod G-Codes:     Lou Cal, OT Pager [phone] 09/29/2017   Raymondo Band 09/29/2017, 2:19 PM

## 2017-09-29 NOTE — Progress Notes (Signed)
Physical Therapy Treatment Patient Details Name: [name] MRN: [id number] DOB: 1941-01-05 Today's Date: 09/29/2017    History of Present Illness Pt is a 77 y.o. female admitted 09/21/17 for elective cardiac cath. There wasan apparent dissection versus thrombosis following the initial cath that resulted in repeat cath 3/15 and intubation with associated A-fib arrest requiring CPR; a second stent was placed. Pt with acute GI bleed, s/p EGD with propofol 3/21. PMH includes CKD III, HTN, COPD, non-small cell lung cancer (radiation 2013, 2015), arthritis.   PT Comments    Pt limited by significant fatigue, generalized weakness, and decreased activity tolerance. Only able to tolerate taking steps from bed to recliner with RW, requiring minA for all mobility and to maintain balance while walking. Due to pt's current functional status, recommend pt receive continued SNF-level therapies upon discharge to maximize functional mobility and independence prior to return home. Pt and family in agreement with this. Encouraged pt to be getting up to recliner for all meals with assist from nursing staff. Will continue to follow acutely.   Follow Up Recommendations  SNF;Supervision for mobility/OOB     Equipment Recommendations  Rolling walker with 5" wheels    Recommendations for Other Services       Precautions / Restrictions Precautions Precautions: Fall Precaution Comments: Chest soreness/brusing post-CPR Restrictions Weight Bearing Restrictions: No    Mobility  Bed Mobility Overal bed mobility: Needs Assistance Bed Mobility: Supine to Sit     Supine to sit: Min assist     General bed mobility comments: Increased time and effort with HOB slightly elevated, minA to assist trunk elevation  Transfers Overall transfer level: Needs assistance Equipment used: Rolling walker (2 wheeled) Transfers: Sit to/from Stand Sit to Stand: Mod assist         General transfer comment: Pt unable  to stand with 3x attempt with instructions for hand placement and technique; able to stand with modA for trunk elevation and RW  Ambulation/Gait Ambulation/Gait assistance: Min assist Ambulation Distance (Feet): 2 Feet Assistive device: Rolling walker (2 wheeled) Gait Pattern/deviations: Shuffle;Trunk flexed;Leaning posteriorly Gait velocity: Decreased Gait velocity interpretation: <1.8 ft/sec, indicative of risk for recurrent falls General Gait Details: Slow, unsteady amb with RW and minA to maintain balance; pt leaning posteriorly, requiring repeated cues for anterior translation to prevent falling backwards. Able to march in place ~15 sec standing at recliner, but requesting to sit secondary to [place] and declining further mobility   Stairs            Wheelchair Mobility    Modified Rankin (Stroke Patients Only)       Balance Overall balance assessment: Needs assistance   Sitting balance-Leahy Scale: Fair Sitting balance - Comments: Hesitant to move without holding onto heart pillow     Standing balance-Leahy Scale: Poor Standing balance comment: Reliant on UE support and minA to prevent posterior LOB                            Cognition Arousal/Alertness: Awake/alert Behavior During Therapy: Flat affect Overall Cognitive Status: Within Functional Limits for tasks assessed                                        Exercises      General Comments General comments (skin integrity, edema, etc.): Sitting BP 158/78, standing BP 133/73, post-transfer BP 142/62  Pertinent Vitals/Pain Pain Assessment: Faces Faces Pain Scale: Hurts little more Pain Location: Chest bruising Pain Descriptors / Indicators: Sore;Guarding Pain Intervention(s): Monitored during session    Home Living                      Prior Function            PT Goals (current goals can now be found in the care plan section) Acute Rehab PT Goals Patient  Stated Goal: Return home PT Goal Formulation: With patient Time For Goal Achievement: 10/10/17 Potential to Achieve Goals: Fair Progress towards PT goals: Progressing toward goals    Frequency    Min 3X/week      PT Plan Discharge plan needs to be updated    Co-evaluation              AM-PAC PT "6 Clicks" Daily Activity  Outcome Measure  Difficulty turning over in bed (including adjusting bedclothes, sheets and blankets)?: A Little Difficulty moving from lying on back to sitting on the side of the bed? : Unable Difficulty sitting down on and standing up from a chair with arms (e.g., wheelchair, bedside commode, etc,.)?: Unable Help needed moving to and from a bed to chair (including a wheelchair)?: A Little Help needed walking in hospital room?: A Lot Help needed climbing 3-5 steps with a railing? : A Lot 6 Click Score: 12    End of Session Equipment Utilized During Treatment: Gait belt Activity Tolerance: Patient limited by fatigue Patient left: in chair;with call bell/phone within reach;with family/visitor present Nurse Communication: Mobility status PT Visit Diagnosis: Other abnormalities of gait and mobility (R26.89)     Time: 1137-1150 PT Time Calculation (min) (ACUTE ONLY): 13 min  Charges:  $Therapeutic Activity: 8-22 mins                    G Codes:      Mabeline Caras, PT, DPT Acute Rehab Services  Pager: [place] 09/29/2017, 12:36 PM

## 2017-09-30 ENCOUNTER — Inpatient Hospital Stay (HOSPITAL_COMMUNITY): Payer: Medicare Other | Admitting: Certified Registered Nurse Anesthetist

## 2017-09-30 ENCOUNTER — Encounter (HOSPITAL_COMMUNITY): Payer: Self-pay | Admitting: *Deleted

## 2017-09-30 ENCOUNTER — Encounter (HOSPITAL_COMMUNITY)
Admission: RE | Disposition: A | Discharge status: Skilled Nursing Facility | Payer: Self-pay | Source: Ambulatory Visit | Attending: Interventional Cardiology

## 2017-09-30 HISTORY — PX: ESOPHAGOGASTRODUODENOSCOPY (EGD) WITH PROPOFOL: SHX5813

## 2017-09-30 LAB — BPAM RBC
BLOOD PRODUCT EXPIRATION DATE: 201903302359
BLOOD PRODUCT EXPIRATION DATE: 201904052359
Blood Product Expiration Date: 201903302359
Blood Product Expiration Date: 201903302359
ISSUE DATE / TIME: 201903200104
ISSUE DATE / TIME: 201903201454
ISSUE DATE / TIME: 201903201758
ISSUE DATE / TIME: 201903221656
UNIT TYPE AND RH: 1700
Unit Type and Rh: 1700
Unit Type and Rh: 1700
Unit Type and Rh: 1700

## 2017-09-30 LAB — TYPE AND SCREEN
ABO/RH(D): B NEG
Antibody Screen: NEGATIVE
UNIT DIVISION: 0
UNIT DIVISION: 0
UNIT DIVISION: 0
Unit division: 0

## 2017-09-30 LAB — CBC
HEMATOCRIT: 28.4 % — AB (ref 36.0–46.0)
HEMOGLOBIN: 9.1 g/dL — AB (ref 12.0–15.0)
MCH: 28.6 pg (ref 26.0–34.0)
MCHC: 32 g/dL (ref 30.0–36.0)
MCV: 89.3 fL (ref 78.0–100.0)
Platelets: 240 10*3/uL (ref 150–400)
RBC: 3.18 MIL/uL — ABNORMAL LOW (ref 3.87–5.11)
RDW: 18.9 % — ABNORMAL HIGH (ref 11.5–15.5)
WBC: 12.4 10*3/uL — AB (ref 4.0–10.5)

## 2017-09-30 SURGERY — ESOPHAGOGASTRODUODENOSCOPY (EGD) WITH PROPOFOL
Anesthesia: Monitor Anesthesia Care

## 2017-09-30 MED ORDER — PROPOFOL 10 MG/ML IV BOLUS
INTRAVENOUS | Status: DC | PRN
  Administered 2017-09-30: 20 mg via INTRAVENOUS

## 2017-09-30 MED ORDER — BUTAMBEN-TETRACAINE-BENZOCAINE 2-2-14 % EX AERO
INHALATION_SPRAY | CUTANEOUS | Status: DC | PRN
  Administered 2017-09-30: 1 via TOPICAL

## 2017-09-30 MED ORDER — ONDANSETRON HCL 4 MG/2ML IJ SOLN
INTRAMUSCULAR | Status: DC | PRN
  Administered 2017-09-30: 4 mg via INTRAVENOUS

## 2017-09-30 MED ORDER — SODIUM CHLORIDE 0.9 % IV SOLN
INTRAVENOUS | Status: DC | PRN
  Administered 2017-09-30: 11:00:00 via INTRAVENOUS

## 2017-09-30 MED ORDER — PROPOFOL 500 MG/50ML IV EMUL
INTRAVENOUS | Status: DC | PRN
  Administered 2017-09-30: 100 ug/kg/min via INTRAVENOUS

## 2017-09-30 SURGICAL SUPPLY — 15 items

## 2017-09-30 NOTE — NC FL2 (Signed)
[place] MEDICAID FL2 LEVEL OF CARE SCREENING TOOL     IDENTIFICATION  Patient Name: [name] Birthdate: [birth year] Sex: female Admission Date (Current Location): 09/21/2017  [facility] and [place] Number:  Herbalist and Address:  [facility], [place] [address], [place], [place] [address]      Provider Number: [id number]  Attending Physician Name and Address:  Belva Crome, MD  Relative Name and Phone Number:       Current Level of Care: Hospital Recommended Level of Care: [place] Prior Approval Number:    Date Approved/Denied:   PASRR Number: [id number]  Discharge Plan: SNF    Current Diagnoses: Patient Active Problem List   Diagnosis Date Noted  . Acute upper GI bleed   . Anemia due to acute blood loss   . Closed flail chest   . CAD (coronary artery disease) 09/21/2017  . Coronary stent thrombosis   . Endotracheal tube present   . CAD in native artery 09/20/2017  . Elevated serum creatinine 04/15/2016  . Acute kidney injury superimposed on chronic kidney disease ([place]) 03/24/2016  . Coronary artery calcification seen on CT scan   . CKD (chronic kidney disease), stage III ([place])   . Hypertension   . Hypothyroidism 03/15/2016  . Hyperlipidemia 08/19/2015  . Cancer of upper lobe of left lung ([place])   . Recurrent lung adenocarcinoma ([place]) 10/01/2014  . Local recurrence of left lung cancer ([place])   . Carotid artery disease without cerebral infarction ([place]) 05/20/2013  . Atherosclerosis of aorta ([place]) 05/10/2013  . Macular degeneration 05/10/2013  . Allergic rhinitis 10/20/2012  . Cancer of upper lobe of lung ([place]) 09/28/2011  . Melanoma ([place])   . LBBB (left bundle branch block)   . Gastritis due to nonsteroidal anti-inflammatory drug 06/20/2011  . Hyperthyroidism   . Tremor   . Abdominal pain, RUQ 02/10/2011  . GERD with stricture 02/08/2011  . GOITER, MULTINODULAR 09/25/2009  . URI 09/02/2009  .  Essential hypertension, benign 12/01/2008    Orientation RESPIRATION BLADDER Height & Weight     Self, Time, Situation, Place  Normal Continent, External catheter Weight: 137 lb (62.1 kg) Height:  5' 4.5" (163.8 cm)  BEHAVIORAL SYMPTOMS/MOOD NEUROLOGICAL BOWEL NUTRITION STATUS  (None) (None) Continent Diet(Heart healthy)  AMBULATORY STATUS COMMUNICATION OF NEEDS Skin   Limited Assist Verbally Skin abrasions, Bruising, Other (Comment)(Skin tear.)                       Personal Care Assistance Level of Assistance  Bathing, Feeding, Dressing Bathing Assistance: Limited assistance Feeding assistance: Independent(Modified) Dressing Assistance: Limited assistance     Functional Limitations Info  Sight, Hearing, Speech Sight Info: Adequate Hearing Info: Adequate Speech Info: Adequate    SPECIAL CARE FACTORS FREQUENCY  PT (By licensed PT), Blood pressure, OT (By licensed OT)     PT Frequency: 5 x week OT Frequency: 5 x week            Contractures Contractures Info: Not present    Additional Factors Info  Code Status, Allergies Code Status Info: DNR Allergies Info: Meperidine Hcl, Penicillins, Morphine, Oxycodone-acetaminophen, Propoxyphene, N-acetaminophen, Tramadol Hcl.           Current Medications (09/30/2017):  This is the current hospital active medication list Current Facility-Administered Medications  Medication Dose Route Frequency Provider Last Rate Last Dose  . 0.9 %  sodium chloride infusion   Intravenous Continuous Ronnette Juniper, MD  20 mL/hr at 09/29/17 1530    . [MAR Hold] 0.9 %  sodium chloride infusion   Intravenous Once Ronnette Juniper, MD      . Doug Sou Hold] acetaminophen (TYLENOL) tablet 1,000 mg  1,000 mg Oral TID PRN Arta Silence, MD   1,000 mg at 09/28/17 0750  . [MAR Hold] acetaminophen (TYLENOL) tablet 1,000 mg  1,000 mg Oral BID Arta Silence, MD   1,000 mg at 09/30/17 0831  . [MAR Hold] albuterol (PROVENTIL) (2.5 MG/3ML) 0.083% nebulizer  solution 2.5 mg  2.5 mg Nebulization Q4H PRN Arta Silence, MD   2.5 mg at 09/24/17 1833  . [MAR Hold] alum & mag hydroxide-simeth (MAALOX/MYLANTA) 200-200-20 MG/5ML suspension 30 mL  30 mL Oral Q6H PRN Arta Silence, MD   30 mL at 09/28/17 1856  . [MAR Hold] arformoterol (BROVANA) nebulizer solution 15 mcg  15 mcg Nebulization BID Arta Silence, MD   15 mcg at 09/30/17 0920  . [MAR Hold] aspirin EC tablet 81 mg  81 mg Oral Daily Arta Silence, MD   81 mg at 09/29/17 1044  . [MAR Hold] atorvastatin (LIPITOR) tablet 80 mg  80 mg Oral q1800 Arta Silence, MD   80 mg at 09/29/17 1717  . [MAR Hold] bisacodyl (DULCOLAX) suppository 10 mg  10 mg Rectal Daily PRN Arta Silence, MD      . Doug Sou Hold] budesonide (PULMICORT) nebulizer solution 0.5 mg  0.5 mg Nebulization BID Arta Silence, MD   0.5 mg at 09/30/17 0920  . [MAR Hold] calcium carbonate (TUMS - dosed in mg elemental calcium) chewable tablet 200 mg of elemental calcium  1 tablet Oral Daily PRN Arta Silence, MD   200 mg of elemental calcium at 09/26/17 1710  . [MAR Hold] calcium-vitamin D (OSCAL WITH D) 500-200 MG-UNIT per tablet 2 tablet  2 tablet Oral Daily Arta Silence, MD   2 tablet at 09/29/17 1043  . [MAR Hold] clopidogrel (PLAVIX) tablet 75 mg  75 mg Oral Q breakfast Arta Silence, MD   75 mg at 09/29/17 2831  . [MAR Hold] ferrous sulfate tablet 325 mg  325 mg Oral Q breakfast Arta Silence, MD   325 mg at 09/29/17 5176  . [MAR Hold] gabapentin (NEURONTIN) capsule 600 mg  600 mg Oral QHS Arta Silence, MD   600 mg at 09/29/17 2126  . [MAR Hold] hydrALAZINE (APRESOLINE) injection 10 mg  10 mg Intravenous Q6H PRN Arta Silence, MD   10 mg at 09/25/17 1949  . [MAR Hold] levothyroxine (SYNTHROID, LEVOTHROID) tablet 100 mcg  100 mcg Oral QAC breakfast Arta Silence, MD   100 mcg at 09/30/17 1607  . [MAR Hold] lidocaine (LIDODERM) 5 % 1 patch  1 patch Transdermal Q24H Arta Silence, MD   1 patch at 09/29/17 1044  .  [MAR Hold] MEDLINE mouth rinse  15 mL Mouth Rinse BID Arta Silence, MD   15 mL at 09/29/17 1045  . [MAR Hold] metoprolol tartrate (LOPRESSOR) tablet 25 mg  25 mg Oral BID [place], Peter M, MD   25 mg at 09/29/17 2126  . [MAR Hold] multivitamin with minerals tablet 1 tablet  1 tablet Oral Daily Arta Silence, MD   1 tablet at 09/29/17 1043  . [MAR Hold] nitroGLYCERIN (NITROSTAT) SL tablet 0.4 mg  0.4 mg Sublingual Q5 min PRN Arta Silence, MD      . Doug Sou Hold] ondansetron [facility]) injection 4 mg  4 mg Intravenous Q6H PRN Arta Silence, MD   4 mg at 09/28/17 1856  .  pantoprazole (PROTONIX) 80 mg in sodium chloride 0.9 % 250 mL (0.32 mg/mL) infusion  8 mg/hr Intravenous Continuous Ronnette Juniper, MD 25 mL/hr at 09/30/17 0313 8 mg/hr at 09/30/17 0313  . [MAR Hold] pantoprazole (PROTONIX) injection 40 mg  40 mg Intravenous Q12H Ronnette Juniper, MD      . Doug Sou Hold] zolpidem [facility]) tablet 5 mg  5 mg Oral QHS PRN Arta Silence, MD   5 mg at 09/29/17 2126     Discharge Medications: Please see discharge summary for a list of discharge medications.  Relevant Imaging Results:  Relevant Lab Results:   Additional Information SS#: [id number]  [name], LCSW

## 2017-09-30 NOTE — Anesthesia Postprocedure Evaluation (Signed)
Anesthesia Post Note  Patient: [name]  Procedure(s) Performed: ESOPHAGOGASTRODUODENOSCOPY (EGD) WITH PROPOFOL (N/A )     Patient location during evaluation: PACU Anesthesia Type: MAC Level of consciousness: awake and alert Pain management: pain level controlled Vital Signs Assessment: post-procedure vital signs reviewed and stable Respiratory status: spontaneous breathing, nonlabored ventilation, respiratory function stable and patient connected to nasal cannula oxygen Cardiovascular status: stable and blood pressure returned to baseline Postop Assessment: no apparent nausea or vomiting Anesthetic complications: no    Last Vitals:  Vitals:   09/30/17 1144 09/30/17 1156  BP: (!) 154/47 (!) 170/56  Pulse: 93   Resp: 20 20  Temp: 36.5 C   SpO2: 99% 96%    Last Pain:  Vitals:   09/30/17 1156  TempSrc:   PainSc: 0-No pain                 [name] DAVID

## 2017-09-30 NOTE — Interval H&P Note (Signed)
History and Physical Interval Note:  09/30/2017 10:47 AM  [name]  has presented today for surgery, with the diagnosis of Melena, anemia  The various methods of treatment have been discussed with the patient and family. After consideration of risks, benefits and other options for treatment, the patient has consented to  Procedure(s): ESOPHAGOGASTRODUODENOSCOPY (EGD) WITH PROPOFOL (N/A) as a surgical intervention .  The patient's history has been reviewed, patient examined, no change in status, stable for surgery.  I have reviewed the patient's chart and labs.  Questions were answered to the patient's satisfaction.     Risks (bleeding, infection, bowel perforation that could require surgery, sedation-related changes in cardiopulmonary systems), benefits (identification and possible treatment of source of symptoms, exclusion of certain causes of symptoms), and alternatives (watchful waiting, radiographic imaging studies, empiric medical treatment)  were explained to patient in detail and patient wishes to proceed.  [name]

## 2017-09-30 NOTE — Transfer of Care (Signed)
Immediate Anesthesia Transfer of Care Note  Patient: [name]  Procedure(s) Performed: ESOPHAGOGASTRODUODENOSCOPY (EGD) WITH PROPOFOL (N/A )  Patient Location: PACU  Anesthesia Type:MAC  Level of Consciousness: awake and alert   Airway & Oxygen Therapy: Patient Spontanous Breathing  Post-op Assessment: Report given to RN and Post -op Vital signs reviewed and stable  Post vital signs: Reviewed and stable  Last Vitals:  Vitals Value Taken Time  BP 154/47 09/30/2017 11:44 AM  Temp 36.5 C 09/30/2017 11:44 AM  Pulse 95 09/30/2017 11:45 AM  Resp 21 09/30/2017 11:45 AM  SpO2 98 % 09/30/2017 11:45 AM  Vitals shown include unvalidated device data.  Last Pain:  Vitals:   09/30/17 1144  TempSrc: Oral  PainSc:       Patients Stated Pain Goal: 2 (98/33/82 5053)  Complications: No apparent anesthesia complications

## 2017-09-30 NOTE — Brief Op Note (Signed)
09/21/2017 - 09/30/2017  11:44 AM  PATIENT:  [name]  77 y.o. female  PRE-OPERATIVE DIAGNOSIS:  Melena, anemia  POST-OPERATIVE DIAGNOSIS:  small gastric ulcers, gastritis, bx taken  PROCEDURE:  Procedure(s): ESOPHAGOGASTRODUODENOSCOPY (EGD) WITH PROPOFOL (N/A)  SURGEON:  Surgeon(s) and Role:    * Coulter Oldaker, MD - Primary  Findings ------------ - EGD showed small clean-based ulcer. No evidence of active bleeding.  Recommendations ------------------------- - Continue Protonix drip for today - Start cardiac diet - Monitor H&H. Transfuse as needed to keep hemoglobin around 8. - GI will follow  Otis Brace MD, [place] 09/30/2017, 11:45 AM  Contact #  [phone]

## 2017-09-30 NOTE — Clinical Social Work Note (Signed)
Clinical Social Work Assessment  Patient Details  Name: [name] MRN: [id number] Date of Birth: October 30, 1940  Date of referral:  09/30/17               Reason for consult:  Facility Placement, Discharge Planning                Permission sought to share information with:    Permission granted to share information::     Name::        Agency::     Relationship::     Contact Information:     Housing/Transportation Living arrangements for the past 2 months:  Single Family Home Source of Information:  Medical Team, Adult Children Patient Interpreter Needed:  None Criminal Activity/Legal Involvement Pertinent to Current Situation/Hospitalization:  No - Comment as needed Significant Relationships:  Adult Children, Other Family Members Lives with:  Self Do you feel safe going back to the place where you live?  Yes Need for family participation in patient care:  Yes (Comment)  Care giving concerns:  PT recommending SNF once medically stable for discharge.   Social Worker assessment / plan:  Patient not in room although daughter and another support is in the room. Patient's daughter stated that she and the patient are aware of PT recommendations and patient has voiced that [facility] is first preference. Her husband had been there before as well. Plan is for return home after discharge from SNF. Patient's children are working on renting a hospital bed and getting a reclining lift chair to help her at home. CSW will confirm with patient that she wants SNF once she is available before sending out referral. No further concerns. CSW encouraged patient's daughter to contact CSW as needed. CSW will continue to follow patient and her family for support and facilitate discharge to SNF once medically stable.  Employment status:  Retired Forensic scientist:  Medicare PT Recommendations:  [place] / Referral to community resources:  [place]  Patient/Family's Response to care:  Patient not available but daughter says she wants [facility] SNF. Patient's family supportive and involved in patient's care. Patient's daughter appreciated social work intervention.  Patient/Family's Understanding of and Emotional Response to Diagnosis, Current Treatment, and Prognosis:  Patient not available. Patient's daughter has a good understanding of the reason for admission and need for rehab prior to returning home. Patient's daughter appears happy with hospital care.  Emotional Assessment Appearance:  Appears stated age Attitude/Demeanor/Rapport:  Unable to Assess Affect (typically observed):  Unable to Assess Orientation:  Oriented to Self, Oriented to Place, Oriented to  Time, Oriented to Situation Alcohol / Substance use:  Never Used Psych involvement (Current and /or in the community):  No (Comment)  Discharge Needs  Concerns to be addressed:  Care Coordination Readmission within the last 30 days:  No Current discharge risk:  Dependent with Mobility, Lives alone Barriers to Discharge:  Continued Medical Work up   [name], LCSW 09/30/2017, 12:22 PM

## 2017-09-30 NOTE — Anesthesia Preprocedure Evaluation (Addendum)
Anesthesia Evaluation  Patient identified by MRN, date of birth, ID band Patient awake    Reviewed: Allergy & Precautions, NPO status , Patient's Chart, lab work & pertinent test results  Airway Mallampati: I  TM Distance: >3 FB Neck ROM: Full    Dental   Pulmonary former smoker,  H/O lung CA   Pulmonary exam normal        Cardiovascular hypertension, + CAD and + Cardiac Stents  Normal cardiovascular exam  Intubated in cath lab 5 days ago after VF after RCA occlusion.   Neuro/Psych    GI/Hepatic GERD  Medicated and Controlled,  Endo/Other    Renal/GU Renal InsufficiencyRenal disease     Musculoskeletal   Abdominal   Peds  Hematology   Anesthesia Other Findings   Reproductive/Obstetrics                            Anesthesia Physical Anesthesia Plan  ASA: III  Anesthesia Plan: MAC   Post-op Pain Management:    Induction: Intravenous  PONV Risk Score and Plan: 2 and Ondansetron  Airway Management Planned: Simple Face Mask  Additional Equipment:   Intra-op Plan:   Post-operative Plan:   Informed Consent: I have reviewed the patients History and Physical, chart, labs and discussed the procedure including the risks, benefits and alternatives for the proposed anesthesia with the patient or authorized representative who has indicated his/her understanding and acceptance.     Plan Discussed with: CRNA and Surgeon  Anesthesia Plan Comments:         Anesthesia Quick Evaluation

## 2017-09-30 NOTE — Op Note (Signed)
[facility] Patient Name: [name] Procedure Date : 09/30/2017 MRN: [id number] Attending MD: Otis Brace , MD Date of Birth: 1940-09-15 CSN: [id number] Age: 77 Admit Type: Inpatient Procedure:                Upper GI endoscopy Indications:              Melena, Suspected upper gastrointestinal bleeding Providers:                Otis Brace, MD, Elna Breslow, RN, Charolette Child, Technician, Rejeana Brock, CRNA Referring MD:              Medicines:                Sedation Administered by an Anesthesia Professional Complications:            No immediate complications. Estimated Blood Loss:     Estimated blood loss was minimal. Procedure:                Pre-Anesthesia Assessment:                           - Prior to the procedure, a History and Physical                            was performed, and patient medications and                            allergies were reviewed. The patient's tolerance of                            previous anesthesia was also reviewed. The risks                            and benefits of the procedure and the sedation                            options and risks were discussed with the patient.                            All questions were answered, and informed consent                            was obtained. Prior Anticoagulants: The patient has                            taken Plavix (clopidogrel), last dose was day of                            procedure. ASA Grade Assessment: III - A patient                            with severe systemic disease. After reviewing the  risks and benefits, the patient was deemed in                            satisfactory condition to undergo the procedure.                           After obtaining informed consent, the endoscope was                            passed under direct vision. Throughout the                            procedure, the  patient's blood pressure, pulse, and                            oxygen saturations were monitored continuously. The                            EG-[id number] ([id number]) scope was introduced through the                            mouth, and advanced to the second part of duodenum.                            The upper GI endoscopy was accomplished without                            difficulty. The patient tolerated the procedure                            well. Scope In: Scope Out: Findings:      The Z-line was regular and was found 38 cm from the incisors.      A medium-sized hiatal hernia was present.      Two non-bleeding superficial gastric ulcers with no stigmata of bleeding       were found in the gastric antrum. Biopsies were taken with a cold       forceps for histology. scar mark probably from previously healed ulcer       noted in the antrum.      The cardia and gastric fundus were normal on retroflexion.      The duodenal bulb, first portion of the duodenum and second portion of       the duodenum were normal. Impression:               - Z-line regular, 38 cm from the incisors.                           - Medium-sized hiatal hernia.                           - Non-bleeding gastric ulcers with no stigmata of                            bleeding. Biopsied.                           -  Normal duodenal bulb, first portion of the                            duodenum and second portion of the duodenum. Moderate Sedation:      Moderate (conscious) sedation was personally administered by an       anesthesia professional. The following parameters were monitored: oxygen       saturation, heart rate, blood pressure, and response to care. Recommendation:           - Return patient to hospital ward for ongoing care.                           - Cardiac diet.                           - Continue present medications.                           - Await pathology results. Procedure Code(s):        ---  Professional ---                           [phone], Esophagogastroduodenoscopy, flexible,                            transoral; with biopsy, single or multiple Diagnosis Code(s):        --- Professional ---                           K44.9, Diaphragmatic hernia without obstruction or                            gangrene                           K25.9, Gastric ulcer, unspecified as acute or                            chronic, without hemorrhage or perforation                           K92.1, Melena (includes Hematochezia) CPT copyright 2016 American Medical Association. All rights reserved. The codes documented in this report are preliminary and upon coder review may  be revised to meet current compliance requirements. Otis Brace, MD Otis Brace, MD 09/30/2017 11:43:51 AM Number of Addenda: 0

## 2017-10-01 DIAGNOSIS — I25119 Atherosclerotic heart disease of native coronary artery with unspecified angina pectoris: Principal | ICD-10-CM

## 2017-10-01 DIAGNOSIS — E782 Mixed hyperlipidemia: Secondary | ICD-10-CM

## 2017-10-01 LAB — CBC
HCT: 26.9 % — ABNORMAL LOW (ref 36.0–46.0)
Hemoglobin: 8.7 g/dL — ABNORMAL LOW (ref 12.0–15.0)
MCH: 29.3 pg (ref 26.0–34.0)
MCHC: 32.3 g/dL (ref 30.0–36.0)
MCV: 90.6 fL (ref 78.0–100.0)
PLATELETS: 250 10*3/uL (ref 150–400)
RBC: 2.97 MIL/uL — AB (ref 3.87–5.11)
RDW: 19.3 % — AB (ref 11.5–15.5)
WBC: 12 10*3/uL — AB (ref 4.0–10.5)

## 2017-10-01 LAB — URINALYSIS, ROUTINE W REFLEX MICROSCOPIC
Bilirubin Urine: NEGATIVE
Glucose, UA: NEGATIVE mg/dL
Hgb urine dipstick: NEGATIVE
KETONES UR: 5 mg/dL — AB
LEUKOCYTES UA: NEGATIVE
NITRITE: NEGATIVE
PROTEIN: NEGATIVE mg/dL
Specific Gravity, Urine: 1.011 (ref 1.005–1.030)
pH: 6 (ref 5.0–8.0)

## 2017-10-01 MED ORDER — HYDRALAZINE HCL 20 MG/ML IJ SOLN
10.0000 mg | Freq: Once | INTRAMUSCULAR | Status: DC
  Filled 2017-10-01: qty 1

## 2017-10-01 MED ORDER — HYDROCODONE-ACETAMINOPHEN 10-325 MG PO TABS
1.0000 | ORAL_TABLET | Freq: Four times a day (QID) | ORAL | Status: DC | PRN
  Administered 2017-10-01 – 2017-10-02 (×2): 1 via ORAL
  Filled 2017-10-01 (×3): qty 1

## 2017-10-01 MED ORDER — HYDROCODONE-ACETAMINOPHEN 10-325 MG PO TABS
1.0000 | ORAL_TABLET | Freq: Once | ORAL | Status: AC
  Administered 2017-10-01: 1 via ORAL
  Filled 2017-10-01: qty 1

## 2017-10-01 MED ORDER — METOPROLOL TARTRATE 50 MG PO TABS
50.0000 mg | ORAL_TABLET | Freq: Two times a day (BID) | ORAL | Status: DC
  Administered 2017-10-01: 50 mg via ORAL
  Filled 2017-10-01: qty 1

## 2017-10-01 NOTE — Progress Notes (Signed)
Spoke with EKG via phone and are in progress. Kerin Ransom paged to update. AM lopressor given, APAP given.

## 2017-10-01 NOTE — Progress Notes (Signed)
   Notified by RN that patient having a significant amount of pain, especially with movement, not relieved with tylenol. Limited in ability to add NSAID given recent GIB and gastric ulcers. Also with many documented allergies/intolerances to opioids. Will trial a one time dose of norco 10mg -325mg . If patient tolerates without adverse reactions, will order prn. Continue lidocaine patch and

## 2017-10-01 NOTE — Progress Notes (Signed)
Long discussion with patient about current emotional state. She reports she is angry because she "just wanted to die peacefully in my sleep, and I had my chance, and you guys saved me and now I've been robbed of that opportunity." She reports feeling extreme distress over the possibility of suffering for an extended period of time with a poor quality of life in the way her husband did, and states she "knows how to take care of it" and reports she will "just stop eating". Explained to her that the current level of pain will abate with time and that she should be able to return to her previous quality of life, as long as she continues to attempt to mobilize and participate in progression activities. She reports she is willing to do this. MDs notified of discussion.

## 2017-10-01 NOTE — Plan of Care (Signed)
Pain and anxiety have been major barriers to progression t/o the day. Mobility remains extremely difficult. Extensively educated pt and family on importance of mobility in spite of pain.

## 2017-10-01 NOTE — Progress Notes (Addendum)
Progress Note  Patient Name: [name] Date of Encounter: 10/01/2017  Primary Cardiologist: Ena Dawley, MD   Subjective   Patient with complaints of palpitations and SOB this AM. Denies overt chest pain/pressure but has difficulty characterizing chest discomfort. Also with complaints of urinary frequency and dysuria. Respiratory therapist at bedside preparing to give nebulizer.  Inpatient Medications    Scheduled Meds: . acetaminophen  1,000 mg Oral BID  . arformoterol  15 mcg Nebulization BID  . aspirin EC  81 mg Oral Daily  . atorvastatin  80 mg Oral q1800  . budesonide (PULMICORT) nebulizer solution  0.5 mg Nebulization BID  . calcium-vitamin D  2 tablet Oral Daily  . clopidogrel  75 mg Oral Q breakfast  . ferrous sulfate  325 mg Oral Q breakfast  . gabapentin  600 mg Oral QHS  . hydrALAZINE  10 mg Intravenous Once  . levothyroxine  100 mcg Oral QAC breakfast  . lidocaine  1 patch Transdermal Q24H  . mouth rinse  15 mL Mouth Rinse BID  . metoprolol tartrate  25 mg Oral BID  . multivitamin with minerals  1 tablet Oral Daily  . [START ON 10/02/2017] pantoprazole  40 mg Intravenous Q12H   Continuous Infusions: . sodium chloride    . pantoprozole (PROTONIX) infusion 8 mg/hr (10/01/17 0701)   PRN Meds: acetaminophen, albuterol, alum & mag hydroxide-simeth, bisacodyl, calcium carbonate, hydrALAZINE, nitroGLYCERIN, ondansetron (ZOFRAN) IV, zolpidem   Vital Signs    Vitals:   10/01/17 0605 10/01/17 0833 10/01/17 0925 10/01/17 1248  BP: (!) 172/74 (!) 131/52 (!) 142/71 (!) 123/59  Pulse:      Resp: 20 20 18 19   Temp:      TempSrc:      SpO2:  96% 97%   Weight:      Height:        Intake/Output Summary (Last 24 hours) at 10/01/2017 1328 Last data filed at 10/01/2017 1300 Gross per 24 hour  Intake 1915.83 ml  Output 2100 ml  Net -184.17 ml   Filed Weights   09/22/17 0500 09/26/17 2104 09/30/17 1035  Weight: 143 lb 15.4 oz (65.3 kg) 137 lb 2 oz (62.2 kg)  137 lb (62.1 kg)    Telemetry    Sinus tachycardia - Personally Reviewed  ECG    Sinus tachycardia, LBBB - Personally Reviewed  Physical Exam   GEN: Frail elderly female laying in bed mildly tachypneic.   Neck: No JVD, no carotid bruits Cardiac: tachycardic, regular rhythm, no murmurs, rubs, or gallops.  Respiratory: Clear to auscultation bilaterally, no obvious wheezes/ rales/ rhonchi anteriorly GI: NABS, Soft, nontender, non-distended  MS: No edema; No deformity. Neuro:  Nonfocal, moving all extremities spontaneously Psych: Normal affect   Labs    Chemistry Recent Labs  Lab 09/25/17 0359 09/26/17 0218 09/27/17 0034  NA 138 137 137  K 3.4* 5.0 4.4  CL 100* 102 105  CO2 29 27 26   GLUCOSE 111* 106* 152*  BUN 15 31* 48*  CREATININE 0.82 0.81 0.92  CALCIUM 9.1 8.8* 8.9  GFRNONAA >60 >60 59*  GFRAA >60 >60 >60  ANIONGAP 9 8 6      Hematology Recent Labs  Lab 09/29/17 1019 09/30/17 0544 10/01/17 0214  WBC 16.8* 12.4* 12.0*  RBC 2.62* 3.18* 2.97*  HGB 7.7* 9.1* 8.7*  HCT 23.6* 28.4* 26.9*  MCV 90.1 89.3 90.6  MCH 29.4 28.6 29.3  MCHC 32.6 32.0 32.3  RDW 17.8* 18.9* 19.3*  PLT 259 240  250    Cardiac EnzymesNo results for input(s): TROPONINI in the last 168 hours. No results for input(s): TROPIPOC in the last 168 hours.   BNPNo results for input(s): BNP, PROBNP in the last 168 hours.   DDimer No results for input(s): DDIMER in the last 168 hours.   Radiology    No results found.  Cardiac Studies     LEFT HEART CATH AND CORONARY ANGIOGRAPHY 09/21/17  Conclusion    Segmental proximal to mid 90% RCA stenosis. This lesion was treated with stenting using overlapping Synergy 2.5 mm stents to postdilated to 3 mm in diameter with TIMI grade III flow. 0% stenosis was noted post procedure.  Ostial 50-60% circumflex narrowing.  Luminal irregularities throughout the proximal to mid LAD. Very distal eccentric tandem 70% stenoses near the apex. No focally  high-grade stenosis is noted.  Normal left main  Low normal LV systolic function in the 70-62% range. Upper normal left ventricular end-diastolic pressure of 17 mmHg.  RECOMMENDATIONS:   Aspirin and Plavix for at least 6 months in the setting of chronic stable ischemic heart disease. Given overlapping stents it would be preferable to continue dual antiplatelet therapy for 12 months.  Aggressive risk factor modification using high intensity statin therapy for as long as tolerated. I have started atorvastatin 80 mg daily and discontinued pravastatin. Blood pressure control with target 130/80 mmHg or less.  Phase 2 cardiac rehab.  Overnight stay since the patient lives alone, is relatively frail, has had some bleeding from the radial cath site.       LEFT HEART CATH AND CORONARY ANGIOGRAPHY 09/21/17  Conclusion    Acute occlusion of the mid right coronary distal to the previously placed stents from earlier in the day. Acute occlusion was accompanied by ventricular fibrillation. The patient was successfully resuscitated.  Successful recanalization of the right coronary and placement of an additional stent overlapping the distal margin of the previously placed stent. Final stent diameter 3.25 mm throughout the entire stented segment from distal to proximal. 3.5 mm diameter [place] balloon was used at the very proximal stent margin after intravascular ultrasound demonstrated decreased apposition. TIMI grade III flow with no evidence of stenosis postprocedure.  Intravascular ultrasound was used and demonstrated acceptable stent apposition from distal to proximal with the exception of the proximal margin which was underexpanded. As noted above a 3.5 balloon was used to further expand the stent.  RECOMMENDATION:   Aggrastat for 10 hours.  Continue Plavix.  Critical care medicine to manage the patient's ventilator.  Continue IV Versed and fentanyl for sedation while on the  ventilator.  Discussed the situation with family. Do not anticipate discharge for several days.  IV hydration and tract kidney function.    Echocardiogram 08/04/2016: Study Conclusions  - Left ventricle: LVEF is approximately 55% with septal hypokinesis   consistent with conduction delay. The cavity size was normal.   Wall thickness was increased in a pattern of mild LVH. Doppler   parameters are consistent with abnormal left ventricular   relaxation (grade 1 diastolic dysfunction). - Pericardium, extracardiac: A trivial pericardial effusion was   identified.   Patient Profile     77 y.o. female with abnormal coronary CT, CKD stage III, mild carotid stenosis, hypothyroidism, GERD, NSCSL status post right upper lobectomy/XRT who initially presented for elective cardiac catheterization on 3/14 and had a DES placed in the RCA. There wasan apparent dissection versus thrombosis following the initial cath that resulted in repeat cath and intubation with  associated A. fib arrest. A second stent was placed overlapping the distal of the previous stent in the RCA with grade 3 TIMI flow observed following placement of the first and second stents.  Assessment & Plan    1. Acute GI bleed: s/p EGD 3/21 with cratered ulcers and blood pooling in stomach; repeat EGD 3/23 with superficial non-bleeding ulcers. Hgb stable 9.1>8.7 today (baseline 10.4).  - Continue management per GI - Continue protonix gtt  - Transfuse for Hgb<8  2. CAD s/p PCI/stenting to pRCA: [place] 3/14 with stenting of RCA with subsequent acute vessel reocclusion with thrombosis distal to stent, most likely due to edge dissection. Reocclusion complicated by Vfib arrest requiring CPR and additional DES placed distal to the first stent.  - Continue ASA, plavix, and statin  3. Tachycardia: patient noted feeling her heart racing this morning. States she has been on diltiazem for both HR and BP control in the past, held this admission.  EKG with sinus tachycardia with LBBB (chronic), rate 120.  - Consider restarting home diltiazem 240mg  24hr cap for better HR and BP control  4. HTN: BP elevated over the past 24 hours. Antihypertensives were held in the setting of acute GI bleed. Restarted on metoprolol 25mg  BID 3/22.  - Consider restarting home diltiazem today  5. HLD:  - continue high dose statin  6. COPD: with some SOB this AM - Continue inhaler therapy  7. Chest wall pain/ flail sternum: 2/2 CPR. Improving - Continue tylenol prn  8. Urinary frequency/Dysuria: c/o burning with urination and increased frequency - Will check UA/UCx  9. Hx of [place] lung Ca: s/p RUL lobectomy in 2009, XRT to LUL nodule 2013, XRT for lymphadenopathy 2015, and Tarceva and thermal ablation in 2016. - Continue follow-up with Dr. Julien Nordmann   For questions or updates, please contact [place] Please consult [email or web address] for contact info under Cardiology/STEMI.      Signed, Rozann Lesches, MD  10/01/2017, 1:28 PM   [phone]   Attending note:  Patient seen and examined.  Reviewed recent hospital course and note by Ms. Tommye Standard PA-C. Ms. Scheid reports no chest tightness but has experienced more shortness of breath this morning and a feeling that her heart rate is faster than normal.  At home she was on both atenolol and Cardizem CD, however these medications have had to be held during her acute hospital stay.  She is now on Lopressor at 25 mg twice daily.  Blood pressure has been stable.  On examination she is in no acute distress.  Heart rate is 100-110.  Lungs exhibit no wheezing.  Cardiac exam reveals RRR without gallop.  ECG shows sinus tachycardia with old left bundle branch block and PVC.  Follow-up lab work shows relatively stable hemoglobin at 8.7, platelets 250.  Plan to continue aspirin, Plavix, Lipitor, increase Lopressor to 50 mg twice daily.  Would up-titrate beta blocker preferentially prior to reinitiating Cardizem  CD if possible in light of ischemic heart disease.  Satira Sark, M.D., F.A.C.C.

## 2017-10-01 NOTE — Progress Notes (Signed)
Pt has continued to report "excruciating" pain t/o the day which has greatly interfered with her ability to participate in mobility progression. Splinting, repositioning have been tried with minimal success. APAP has shown limited pain effects. Notified PA [name].

## 2017-10-01 NOTE — Progress Notes (Signed)
[facility] Gastroenterology Progress Note  [name] 77 y.o. February 07, 1941  CC:  GI bleed   Subjective: no further bleeding episode. No bowel movement today. Denies abdominal pain.   ROS : positive for difficulty breathing. Positive for palpitation.   Objective: Vital signs in last 24 hours: Vitals:   10/01/17 0605 10/01/17 0833  BP: (!) 172/74 (!) 131/52  Pulse:    Resp: 20 20  Temp:    SpO2:  96%    Physical Exam:  General:  Alert, cooperative, no distress, appears stated age  Head:  Normocephalic, without obvious abnormality, atraumatic  Eyes:  , EOM's intact,   Lungs:   Decreased breath sounds bilaterally.  Heart:  tachycardia S1, S2 normal  Abdomen:   Soft, non-tender, nondistended, bowel sounds present, no peritoneal signs  Extremities: Extremities normal, atraumatic, no  edema       Lab Results: No results for input(s): NA, K, CL, CO2, GLUCOSE, BUN, CREATININE, CALCIUM, MG, PHOS in the last 72 hours. No results for input(s): AST, ALT, ALKPHOS, BILITOT, PROT, ALBUMIN in the last 72 hours. Recent Labs    09/30/17 0544 10/01/17 0214  WBC 12.4* 12.0*  HGB 9.1* 8.7*  HCT 28.4* 26.9*  MCV 89.3 90.6  PLT 240 250   No results for input(s): LABPROT, INR in the last 72 hours.    Assessment/Plan: - upper GI bleeding. EGD yesterday showed lean-based gastric ulcers. No evidence of active bleeding. Hemoglobin relatively stable today. - coronary artery disease status post PCI. Currently on aspirin and Plavix. - cardiac arrest status post CPR. - COPD - H/o  non-small cell lung cancer   Recommendation ----------------------- -  minimal drop in hemoglobin. No evidence of further active bleeding. Continue Protonix drip for now. If hemoglobin remains stable, transition to IV twice a day PPI tomorrow - Monitor H&H. Transfuse to keep hemoglobin around 8. - GI will follow  Otis Brace MD, [place] 10/01/2017, 10:46 AM  Contact #  [phone]

## 2017-10-01 NOTE — Progress Notes (Signed)
Spoke with Christa from cardiology. Holding NTG for now. She is coming to assess. Reassured patient.

## 2017-10-01 NOTE — Progress Notes (Signed)
Pt reporting 8/10 CP over the R mid chest that does not radiate. She reports it is "different than the CPR pain" and she "feels funny like something is wrong". Endorses numbness/tingling in hands and SOB. HR 119-120, NSR with T wave depression. 12 lead EKG ordered. Pain is reproducible, does not radiate. Pt has reiterated her desire to remain a DNR several times during this episode.

## 2017-10-02 ENCOUNTER — Encounter (HOSPITAL_COMMUNITY): Payer: Self-pay | Admitting: Gastroenterology

## 2017-10-02 LAB — BASIC METABOLIC PANEL
ANION GAP: 9 (ref 5–15)
BUN: 12 mg/dL (ref 6–20)
CO2: 19 mmol/L — AB (ref 22–32)
Calcium: 8.4 mg/dL — ABNORMAL LOW (ref 8.9–10.3)
Chloride: 108 mmol/L (ref 101–111)
Creatinine, Ser: 0.81 mg/dL (ref 0.44–1.00)
GFR calc Af Amer: >60 mL/min (ref >60)
GFR calc non Af Amer: >60 mL/min (ref >60)
GLUCOSE: 110 mg/dL — AB (ref 65–99)
POTASSIUM: 3.9 mmol/L (ref 3.5–5.1)
Sodium: 136 mmol/L (ref 135–145)

## 2017-10-02 LAB — CBC
HEMATOCRIT: 29.8 % — AB (ref 36.0–46.0)
HEMOGLOBIN: 9.5 g/dL — AB (ref 12.0–15.0)
MCH: 29.2 pg (ref 26.0–34.0)
MCHC: 31.9 g/dL (ref 30.0–36.0)
MCV: 91.7 fL (ref 78.0–100.0)
Platelets: 309 10*3/uL (ref 150–400)
RBC: 3.25 MIL/uL — AB (ref 3.87–5.11)
RDW: 20.1 % — ABNORMAL HIGH (ref 11.5–15.5)
WBC: 11.5 10*3/uL — AB (ref 4.0–10.5)

## 2017-10-02 MED ORDER — POLYETHYLENE GLYCOL 3350 17 G PO PACK
17.0000 g | PACK | Freq: Every day | ORAL | Status: DC
  Administered 2017-10-02 – 2017-10-16 (×13): 17 g via ORAL
  Filled 2017-10-02 (×15): qty 1

## 2017-10-02 MED ORDER — METOPROLOL TARTRATE 50 MG PO TABS
75.0000 mg | ORAL_TABLET | Freq: Two times a day (BID) | ORAL | Status: DC
  Administered 2017-10-02 – 2017-10-03 (×3): 75 mg via ORAL
  Filled 2017-10-02 (×3): qty 1

## 2017-10-02 MED ORDER — SODIUM CHLORIDE 0.9% FLUSH
10.0000 mL | INTRAVENOUS | Status: DC | PRN
  Administered 2017-10-02: 20 mL

## 2017-10-02 MED ORDER — PANTOPRAZOLE SODIUM 40 MG IV SOLR
40.0000 mg | Freq: Two times a day (BID) | INTRAVENOUS | Status: DC
  Administered 2017-10-02 – 2017-10-03 (×4): 40 mg via INTRAVENOUS
  Filled 2017-10-02 (×4): qty 40

## 2017-10-02 NOTE — Progress Notes (Signed)
OT Cancellation Note  Patient Details Name: [name] MRN: [id number] DOB: 29-Jun-1941   Cancelled Treatment:    Reason Eval/Treat Not Completed: Other (comment). Pt nauseated. Will follow up as pt able to tolerate.   [place], [place] 10/02/2017 10/02/2017, 3:14 PM

## 2017-10-02 NOTE — Progress Notes (Addendum)
The patient has been seen in conjunction with Harlan Stains, [facility]. All aspects of care have been considered and discussed. The patient has been personally interviewed, examined, and all clinical data has been reviewed.   Appears to be in better spirits today.  Opiates have felt relief sternal chest discomfort.  Heart rate is had a tendency to be above 110 bpm, with appearance of sinus tachycardia.  Blood pressure has been elevated.  Hemoglobin is stable at 9.5.  Creatinine is normal.  Plan is to further uptitrate beta-blocker therapy which will help with both heart rate and blood pressure.  We will check a TSH to rule out hyperthyroidism.  Ambulate as tolerated.  Progress Note  Patient Name: [name] Date of Encounter: 10/02/2017  Primary Cardiologist: Ena Dawley, MD  Subjective   No chest pain, but feels like her heart is fluttering. Wants to get up and eat her breakfast.   Inpatient Medications    Scheduled Meds: . acetaminophen  1,000 mg Oral BID  . arformoterol  15 mcg Nebulization BID  . aspirin EC  81 mg Oral Daily  . atorvastatin  80 mg Oral q1800  . budesonide (PULMICORT) nebulizer solution  0.5 mg Nebulization BID  . calcium-vitamin D  2 tablet Oral Daily  . clopidogrel  75 mg Oral Q breakfast  . ferrous sulfate  325 mg Oral Q breakfast  . gabapentin  600 mg Oral QHS  . hydrALAZINE  10 mg Intravenous Once  . levothyroxine  100 mcg Oral QAC breakfast  . lidocaine  1 patch Transdermal Q24H  . mouth rinse  15 mL Mouth Rinse BID  . metoprolol tartrate  50 mg Oral BID  . multivitamin with minerals  1 tablet Oral Daily  . pantoprazole  40 mg Intravenous Q12H   Continuous Infusions: . sodium chloride    . pantoprozole (PROTONIX) infusion 8 mg/hr (10/02/17 0229)   PRN Meds: albuterol, alum & mag hydroxide-simeth, bisacodyl, calcium carbonate, hydrALAZINE, HYDROcodone-acetaminophen, nitroGLYCERIN, ondansetron (ZOFRAN) IV, zolpidem   Vital Signs      Vitals:   10/01/17 1931 10/01/17 2014 10/02/17 0431 10/02/17 0758  BP: (!) 186/79  (!) 186/87   Pulse:      Resp: 19  20   Temp: 98.2 F (36.8 C)  97.8 F (36.6 C)   TempSrc: Oral  Oral   SpO2:  95% 97% 98%  Weight:      Height:        Intake/Output Summary (Last 24 hours) at 10/02/2017 0849 Last data filed at 10/02/2017 0500 Gross per 24 hour  Intake 462.08 ml  Output 850 ml  Net -387.92 ml   Filed Weights   09/22/17 0500 09/26/17 2104 09/30/17 1035  Weight: 143 lb 15.4 oz (65.3 kg) 137 lb 2 oz (62.2 kg) 137 lb (62.1 kg)    Telemetry    ST rate 110s - Personally Reviewed  ECG    3/24 ST with LBBB - Personally Reviewed  Physical Exam   General: Well developed, thin older W female appearing in no acute distress. Head: Normocephalic, atraumatic.  Neck: Supple without bruits, JVD. Lungs:  Resp regular and unlabored, CTA. Heart: Tachy, S1, S2, no S3, S4, 3/6 systolic murmur; no rub. Bruising noted to anterior chest wall.  Abdomen: Soft, non-tender, non-distended with normoactive bowel sounds.  Extremities: No clubbing, cyanosis, edema. Distal pedal pulses are 2+ bilaterally. Neuro: Alert and oriented X 3. Moves all extremities spontaneously. Psych: Normal affect, but anxious.   Labs  Chemistry Recent Labs  Lab 09/26/17 0218 09/27/17 0034 10/02/17 0326  NA 137 137 136  K 5.0 4.4 3.9  CL 102 105 108  CO2 27 26 19*  GLUCOSE 106* 152* 110*  BUN 31* 48* 12  CREATININE 0.81 0.92 0.81  CALCIUM 8.8* 8.9 8.4*  GFRNONAA >60 59* >60  GFRAA >60 >60 >60  ANIONGAP 8 6 9      Hematology Recent Labs  Lab 09/30/17 0544 10/01/17 0214 10/02/17 0326  WBC 12.4* 12.0* 11.5*  RBC 3.18* 2.97* 3.25*  HGB 9.1* 8.7* 9.5*  HCT 28.4* 26.9* 29.8*  MCV 89.3 90.6 91.7  MCH 28.6 29.3 29.2  MCHC 32.0 32.3 31.9  RDW 18.9* 19.3* 20.1*  PLT 240 250 309    Cardiac EnzymesNo results for input(s): TROPONINI in the last 168 hours. No results for input(s): TROPIPOC in the last  168 hours.   BNPNo results for input(s): BNP, PROBNP in the last 168 hours.   DDimer No results for input(s): DDIMER in the last 168 hours.    Radiology    No results found.  Cardiac Studies   LEFT HEART CATH AND CORONARY ANGIOGRAPHY 09/21/17  Conclusion    Segmental proximal to mid 90% RCA stenosis. This lesion was treated with stenting using overlapping Synergy 2.5 mm stents to postdilated to 3 mm in diameter with TIMI grade III flow. 0% stenosis was noted post procedure.  Ostial 50-60% circumflex narrowing.  Luminal irregularities throughout the proximal to mid LAD. Very distal eccentric tandem 70% stenoses near the apex. No focally high-grade stenosis is noted.  Normal left main  Low normal LV systolic function in the 37-10% range. Upper normal left ventricular end-diastolic pressure of 17 mmHg.  RECOMMENDATIONS:   Aspirin and Plavix for at least 6 months in the setting of chronic stable ischemic heart disease. Given overlapping stents it would be preferable to continue dual antiplatelet therapy for 12 months.  Aggressive risk factor modification using high intensity statin therapy for as long as tolerated. I have started atorvastatin 80 mg daily and discontinued pravastatin. Blood pressure control with target 130/80 mmHg or less.  Phase 2 cardiac rehab.  Overnight stay since the patient lives alone, is relatively frail, has had some bleeding from the radial cath site.       LEFT HEART CATH AND CORONARY ANGIOGRAPHY 09/21/17  Conclusion    Acute occlusion of the mid right coronary distal to the previously placed stents from earlier in the day. Acute occlusion was accompanied by ventricular fibrillation. The patient was successfully resuscitated.  Successful recanalization of the right coronary and placement of an additional stent overlapping the distal margin of the previously placed stent. Final stent diameter 3.25 mm throughout the entire stented  segment from distal to proximal. 3.5 mm diameter [place] balloon was used at the very proximal stent margin after intravascular ultrasound demonstrated decreased apposition. TIMI grade III flow with no evidence of stenosis postprocedure.  Intravascular ultrasound was used and demonstrated acceptable stent apposition from distal to proximal with the exception of the proximal margin which was underexpanded. As noted above a 3.5 balloon was used to further expand the stent.  RECOMMENDATION:   Aggrastat for 10 hours.  Continue Plavix.  Critical care medicine to manage the patient's ventilator.  Continue IV Versed and fentanyl for sedation while on the ventilator.  Discussed the situation with family. Do not anticipate discharge for several days.  IV hydration and tract kidney function.    Echocardiogram 08/04/2016: Study Conclusions  -  Left ventricle: LVEF is approximately 55% with septal hypokinesis consistent with conduction delay. The cavity size was normal. Wall thickness was increased in a pattern of mild LVH. Doppler parameters are consistent with abnormal left ventricular relaxation (grade 1 diastolic dysfunction). - Pericardium, extracardiac: A trivial pericardial effusion was identified.  Patient Profile     77 y.o. female with abnormal coronary CT, CKD stage III, mild carotid stenosis, hypothyroidism, GERD, NSCSL status post right upper lobectomy/XRT who initially presented for elective cardiac catheterization on 3/14 and had a DES placed in the RCA. There wasan apparent dissection versus thrombosis following the initial cath that resulted in repeat cath and intubation with associated A. fib arrest. A second stent was placed overlapping the distal of the previous stent in the RCA with grade 3 TIMI flow observed following placement of the first and second stents. Developed acute GI bleed requiring transfusion with cratered ulcers.   Assessment & Plan    1.  Acute GI bleed: s/p EGD 3/21 with cratered ulcers and blood pooling in stomach; repeat EGD 3/23 with superficial non-bleeding ulcers. Hgb stable 9.1>8.7>>9.5 today (baseline 10.4).  - Continue management per GI - Continue protonix gtt, hopefully transition to oral dosing today as Hgb has remained stable.   - Transfuse for Hgb<8  2. CAD s/p PCI/stenting to pRCA: [place] 3/14 with stenting of RCA with subsequent acute vessel reocclusion with thrombosis distal to stent, most likely due to edge dissection. Reocclusion complicated by Vfib arrest requiring CPR and additional DES placed distal to the first stent.  - Continue ASA, plavix, and statin  3. Tachycardia: patient noted feeling her heart racing yesterday and again today. Stated she has been on diltiazem for both HR and BP control in the past, held this admission. Remains tachycardic on telemetry.   - will further increase Metoprolol to 75mg  BID this morning.   4. HTN: BP elevated over the past 24 hours. Antihypertensives were held in the setting of acute GI bleed. Increased metoprolol to 75mg  BID this morning.  5. HLD:  - continue high dose statin  6. COPD: with some SOB this AM - Continue inhaler therapy  7. Chest wall pain/ flail sternum: 2/2 CPR. Improving - Continue tylenol prn  8. Urinary frequency/Dysuria: c/o burning with urination and increased frequency - check UA neg, UCx still pending  9. Hx of [place] lung Ca: s/p RUL lobectomy in 2009, XRT to LUL nodule 2013, XRT for lymphadenopathy 2015, and Tarceva and thermal ablation in 2016. - Continue follow-up with Dr. Julien Nordmann   Signed, Reino Bellis, NP  10/02/2017, 8:49 AM  Pager # [phone]   For questions or updates, please contact [facility] Please consult [email or web address] for contact info under Cardiology/STEMI.

## 2017-10-02 NOTE — Progress Notes (Addendum)
Pt had elevated bp. Prn given. Pain meds given. Will pass along to dayshift RN.  Pt requested to speak to the MD about better control of bp and heart rate. Will pass along to dayshift RN.

## 2017-10-02 NOTE — Progress Notes (Signed)
PT Cancellation Note  Patient Details Name: [name] MRN: [id number] DOB: 03-19-1941   Cancelled Treatment:    Reason Eval/Treat Not Completed: Medical issues which prohibited therapy;Patient declined, no reason specified. Spoke with RN in AM and she advised waiting to see pt as pt is nauseated with movement. Checked back in PM and pt refused, stating she is still very nauseous and vomits with any movement. Will try back tomorrow.   Benjiman Core, PTA Pager [phone] Acute Rehab  Allena Katz 10/02/2017, 2:47 PM

## 2017-10-02 NOTE — Social Work (Signed)
CSW went to meet with patient at bedside to discuss SNF offers, and update on [facility] and patient was not feeling well and declined to meet. Daughter, Christa at bedside, confirmed SNF bed of [facility] as primary choice. Daughter indicated that if patient does not get accepted at [facility], pt may desire to go home.   CSW f/u with admission staff at [facility] as they did not accept thru hub and then discuss other SNF offers if appropriate. CSW will f/u.  Elissa Hefty, LCSW Clinical Social Worker [phone]

## 2017-10-02 NOTE — Progress Notes (Signed)
[facility] Gastroenterology Progress Note  SWETA HALSETH 77 y.o. 07-27-1940  CC:  GI bleed   Subjective: no further bleeding episode. Denied abdominal pain, nausea and vomiting. No bowel movement in last 2 days.  ROS : positive for difficulty breathing. Positive for palpitation and weakness   Objective: Vital signs in last 24 hours: Vitals:   10/02/17 0431 10/02/17 0758  BP: (!) 186/87   Pulse:    Resp: 20   Temp: 97.8 F (36.6 C)   SpO2: 97% 98%    Physical Exam:  General:  Alert, cooperative, no distress, appears stated age  Head:  Normocephalic, without obvious abnormality, atraumatic  Eyes:  , EOM's intact,   Lungs:   Decreased breath sounds bilaterally.  Heart:  tachycardia S1, S2 normal  Abdomen:   Soft, non-tender, nondistended, bowel sounds present, no peritoneal signs  Extremities: Extremities normal, atraumatic, no  edema       Lab Results: Recent Labs    10/02/17 0326  NA 136  K 3.9  CL 108  CO2 19*  GLUCOSE 110*  BUN 12  CREATININE 0.81  CALCIUM 8.4*   No results for input(s): AST, ALT, ALKPHOS, BILITOT, PROT, ALBUMIN in the last 72 hours. Recent Labs    10/01/17 0214 10/02/17 0326  WBC 12.0* 11.5*  HGB 8.7* 9.5*  HCT 26.9* 29.8*  MCV 90.6 91.7  PLT 250 309   No results for input(s): LABPROT, INR in the last 72 hours.    Assessment/Plan: - upper GI bleeding. EGD  showed lean-based gastric ulcers. No evidence of active bleeding. Hemoglobin relatively stable today. - coronary artery disease status post PCI. Currently on aspirin and Plavix. - cardiac arrest status post CPR. - COPD - H/o  non-small cell lung cancer   Recommendation ----------------------- -  Change PPI to IV twice a day for today. If Hemoglobin remains stable, switch to oral twice a day PPI. - She needs to continue twice daily PPI for 8 weeks and then, 40 mg once a day PPI indefinitely. - avoid NSAIDs -  MiraLAX for constipation. - pathology pending - recommend repeat  EGD in 2-3 months to document healing of gastric as well as duodenal ulcers. - Follow-up with Dr. Oletta Lamas in 6 weeks.  - GI will sign off. Call us back if needed.  Otis Brace MD, [place] 10/02/2017, 10:07 AM  Contact #  [phone]

## 2017-10-03 DIAGNOSIS — T82867D Thrombosis of cardiac prosthetic devices, implants and grafts, subsequent encounter: Secondary | ICD-10-CM

## 2017-10-03 LAB — CBC
HEMATOCRIT: 34 % — AB (ref 36.0–46.0)
Hemoglobin: 10.8 g/dL — ABNORMAL LOW (ref 12.0–15.0)
MCH: 29.6 pg (ref 26.0–34.0)
MCHC: 31.8 g/dL (ref 30.0–36.0)
MCV: 93.2 fL (ref 78.0–100.0)
PLATELETS: 474 10*3/uL — AB (ref 150–400)
RBC: 3.65 MIL/uL — ABNORMAL LOW (ref 3.87–5.11)
RDW: 19.7 % — AB (ref 11.5–15.5)
WBC: 16.6 10*3/uL — ABNORMAL HIGH (ref 4.0–10.5)

## 2017-10-03 LAB — TSH: TSH: 1.768 u[IU]/mL (ref 0.350–4.500)

## 2017-10-03 LAB — URINE CULTURE: Culture: 80000 — AB

## 2017-10-03 MED ORDER — FUROSEMIDE 10 MG/ML IJ SOLN
20.0000 mg | Freq: Once | INTRAMUSCULAR | Status: AC
  Administered 2017-10-03: 20 mg via INTRAVENOUS
  Filled 2017-10-03: qty 2

## 2017-10-03 MED ORDER — METOPROLOL TARTRATE 25 MG PO TABS
25.0000 mg | ORAL_TABLET | Freq: Once | ORAL | Status: AC
  Administered 2017-10-03: 25 mg via ORAL
  Filled 2017-10-03: qty 1

## 2017-10-03 MED ORDER — METOPROLOL TARTRATE 100 MG PO TABS
100.0000 mg | ORAL_TABLET | Freq: Two times a day (BID) | ORAL | Status: DC
  Administered 2017-10-03 – 2017-10-17 (×27): 100 mg via ORAL
  Filled 2017-10-03 (×27): qty 1

## 2017-10-03 MED ORDER — CIPROFLOXACIN IN D5W 200 MG/100ML IV SOLN
200.0000 mg | Freq: Two times a day (BID) | INTRAVENOUS | Status: DC
  Administered 2017-10-03 – 2017-10-04 (×3): 200 mg via INTRAVENOUS
  Filled 2017-10-03 (×3): qty 100

## 2017-10-03 NOTE — Progress Notes (Signed)
Occupational Therapy Treatment Patient Details Name: [name] MRN: [id number] DOB: Nov 28, 1940 Today's Date: 10/03/2017    History of present illness Pt is a 77 y.o. female admitted 09/21/17 for elective cardiac cath. There wasan apparent dissection versus thrombosis following the initial cath that resulted in repeat cath 3/15 and intubation with associated A-fib arrest requiring CPR; a second stent was placed. Pt with acute GI bleed, s/p EGD with propofol 3/21. PMH includes CKD III, HTN, COPD, non-small cell lung cancer (radiation 2013, 2015), arthritis.   OT comments  Pt feeling better today and willing to participate with OT. Able to ambulate @ 40 ft with 1 rest break and complete ADL task with min A. HR 96-111; RR max 24 and O2 94 RA during activity. Pt states "I get so out of breath". Pt discussing situation and states " I feel like I got robbed out of a nice quiet death". Provided listening while pt expressed her feelings regarding her situation. Will continue to follow acutely and recommend rehab at [facility].   Follow Up Recommendations  SNF;Supervision/Assistance - 24 hour    Equipment Recommendations  Other (comment)(TBA at SNF)    Recommendations for Other Services      Precautions / Restrictions Precautions Precautions: Fall Precaution Comments: Chest soreness/brusing post-CPR Restrictions Weight Bearing Restrictions: No       Mobility Bed Mobility Overal bed mobility: Needs Assistance Bed Mobility: Supine to Sit;Sit to Supine     Supine to sit: Min guard Sit to supine: Min assist      Transfers Overall transfer level: Needs assistance Equipment used: Rolling walker (2 wheeled) Transfers: Sit to/from Stand Sit to Stand: Min assist Stand pivot transfers: Min guard    Pt expressed fear of "passing out"        Balance Overall balance assessment: Needs assistance   Sitting balance-Leahy Scale: Good       Standing balance-Leahy Scale: Fair                              ADL either performed or assessed with clinical judgement   ADL Overall ADL's : Needs assistance/impaired     Grooming: Set up;Sitting Grooming Details (indicate cue type and reason): attempted standing but pt did not want to see herself in the mirror Upper Body Bathing: Supervision/ safety;Set up;Sitting   Lower Body Bathing: Minimal assistance;Sit to/from stand           Toilet Transfer: Minimal assistance           Functional mobility during ADLs: Minimal assistance;Rolling walker General ADL Comments: Increasing activity tolerance slowly     Vision       Perception     Praxis      Cognition Arousal/Alertness: Awake/alert Behavior During Therapy: Flat affect Overall Cognitive Status: Within Functional Limits for tasks assessed                                          Exercises     Shoulder Instructions       General Comments      Pertinent Vitals/ Pain       Pain Assessment: Faces Faces Pain Scale: Hurts little more Pain Location: Chest bruising Pain Descriptors / Indicators: Sore;Guarding Pain Intervention(s): Limited activity within patient's tolerance  Home Living  Prior Functioning/Environment              Frequency  Min 2X/week        Progress Toward Goals  OT Goals(current goals can now be found in the care plan section)  Progress towards OT goals: Progressing toward goals  Acute Rehab OT Goals Patient Stated Goal: Return home OT Goal Formulation: With patient/family Time For Goal Achievement: 10/13/17 Potential to Achieve Goals: Good ADL Goals Pt Will Perform Grooming: with supervision;sitting;standing Pt Will Perform Lower Body Bathing: with min guard assist;sit to/from stand Pt Will Perform Upper Body Dressing: with set-up;sitting Pt Will Perform Lower Body Dressing: with min guard assist;sit to/from stand Pt Will  Transfer to Toilet: with min guard assist;ambulating;bedside commode Pt Will Perform Toileting - Clothing Manipulation and hygiene: with min guard assist;sit to/from stand  Plan Discharge plan remains appropriate    Co-evaluation                 AM-PAC PT "6 Clicks" Daily Activity     Outcome Measure   Help from another person eating meals?: None Help from another person taking care of personal grooming?: A Little Help from another person toileting, which includes using toliet, bedpan, or urinal?: A Lot Help from another person bathing (including washing, rinsing, drying)?: A Lot Help from another person to put on and taking off regular upper body clothing?: A Little Help from another person to put on and taking off regular lower body clothing?: A Lot 6 Click Score: 16    End of Session Equipment Utilized During Treatment: Gait belt;Rolling walker  OT Visit Diagnosis: Muscle weakness (generalized) (M62.81);Unsteadiness on feet (R26.81)   Activity Tolerance Patient tolerated treatment well   Patient Left in bed;with call bell/phone within reach;with bed alarm set;with family/visitor present(pt requesting all rails up)   Nurse Communication Mobility status        Time: 2025-4270 OT Time Calculation (min): 30 min  Charges: OT General Charges $OT Visit: 1 Visit OT Treatments $Self Care/Home Management : 23-37 mins  [facility], OT/L  [phone] 10/03/2017   Janeka Libman,H[place]ARY 10/03/2017, 4:45 PM

## 2017-10-03 NOTE — Progress Notes (Signed)
Physical Therapy Treatment Patient Details Name: [name] MRN: [id number] DOB: 19-Apr-1941 Today's Date: 10/03/2017    History of Present Illness Pt is a 77 y.o. female admitted 09/21/17 for elective cardiac cath. There wasan apparent dissection versus thrombosis following the initial cath that resulted in repeat cath 3/15 and intubation with associated A-fib arrest requiring CPR; a second stent was placed. Pt with acute GI bleed, s/p EGD with propofol 3/21. PMH includes CKD III, HTN, COPD, non-small cell lung cancer (radiation 2013, 2015), arthritis.    PT Comments    Pt fatigued post session with OT in which she reports walking in room.  She refused functional mobility but was agreeable to supine exercises to improve LE strength.  Pt tolerated session well.  Post session positioned patient in chair position to improve posture while eating.  RN informed.      Follow Up Recommendations  SNF;Supervision for mobility/OOB     Equipment Recommendations  Rolling walker with 5" wheels    Recommendations for Other Services OT consult     Precautions / Restrictions Precautions Precautions: Fall Precaution Comments: Chest soreness/brusing post-CPR Restrictions Weight Bearing Restrictions: No    Mobility  Bed Mobility Overal bed mobility: Needs Assistance Bed Mobility: Supine to Sit;Sit to Supine     Supine to sit: Min guard Sit to supine: Min assist   General bed mobility comments: Pt performed scooting in supine with +2 total assistance.  refused bed mobility after fatigue from working with OT.    Transfers Overall transfer level: Needs assistance Equipment used: Rolling walker (2 wheeled) Transfers: Sit to/from Stand Sit to Stand: Min assist Stand pivot transfers: Min guard       General transfer comment: deferred transfers at this time due to patient refusal.    Ambulation/Gait                 Stairs            Wheelchair Mobility    Modified  Rankin (Stroke Patients Only)       Balance Overall balance assessment: Needs assistance   Sitting balance-Leahy Scale: Good       Standing balance-Leahy Scale: Fair                              Cognition Arousal/Alertness: Awake/alert Behavior During Therapy: Flat affect Overall Cognitive Status: Within Functional Limits for tasks assessed                                        Exercises General Exercises - Lower Extremity Ankle Circles/Pumps: AROM;15 reps;Both;Supine Quad Sets: AROM;Both;10 reps;Supine Heel Slides: AROM;Both;10 reps;Supine Hip ABduction/ADduction: AROM;Both;10 reps;Supine Straight Leg Raises: AROM;Both;10 reps;Supine    General Comments        Pertinent Vitals/Pain Pain Assessment: Faces Faces Pain Scale: Hurts little more Pain Location: Chest bruising Pain Descriptors / Indicators: Sore;Guarding Pain Intervention(s): Monitored during session;Repositioned    Home Living                      Prior Function            PT Goals (current goals can now be found in the care plan section) Acute Rehab PT Goals Patient Stated Goal: Return home Potential to Achieve Goals: Fair Progress towards PT goals: Progressing toward goals    Frequency  Min 3X/week      PT Plan Current plan remains appropriate    Co-evaluation              AM-PAC PT "6 Clicks" Daily Activity  Outcome Measure  Difficulty turning over in bed (including adjusting bedclothes, sheets and blankets)?: A Little Difficulty moving from lying on back to sitting on the side of the bed? : Unable Difficulty sitting down on and standing up from a chair with arms (e.g., wheelchair, bedside commode, etc,.)?: Unable Help needed moving to and from a bed to chair (including a wheelchair)?: A Little Help needed walking in hospital room?: A Little Help needed climbing 3-5 steps with a railing? : A Little 6 Click Score: 14    End of  Session   Activity Tolerance: Patient limited by fatigue Patient left: with call bell/phone within reach;with family/visitor present;in bed(bed placed in chair position and patient positioned upright to eat supper.  ) Nurse Communication: Mobility status PT Visit Diagnosis: Other abnormalities of gait and mobility (R26.89)     Time: 5093-2671 PT Time Calculation (min) (ACUTE ONLY): 19 min  Charges:  $Therapeutic Exercise: 8-22 mins                    G Codes:       Governor Rooks, PTA pager [phone]    [name] 10/03/2017, 5:32 PM

## 2017-10-03 NOTE — Progress Notes (Signed)
Peripheral IV in right forearm. Left midline removed. Site clean and dry. Pressure dressing applied. No continuous infusions ordered. Second access no longer needed.   Emelda Fear, RN

## 2017-10-03 NOTE — Social Work (Signed)
CSW confirmed SNF bed offer at [facility].  CSW will advise patient of same.  CSW will continue to follow for disposition.  Elissa Hefty, LCSW Clinical Social Worker [phone]

## 2017-10-03 NOTE — Progress Notes (Addendum)
The patient has been seen in conjunction with Reino Bellis, NP-C. All aspects of care have been considered and discussed. The patient has been personally interviewed, examined, and all clinical data has been reviewed.   Feels better today.  Sat for 3 hours.  Still having sternal chest discomfort.  Discomfort is related to CPR.  Chest reveals clear lung fields anteriorly.  May have a urinary tract infection.  Needs care management to help with placement and rehab facility before returning home.  Ambulate as tolerated.    Progress Note  Patient Name: [name] Date of Encounter: 10/03/2017  Primary Cardiologist: Ena Dawley, MD  Subjective   Still with palpitations, but actually feeling better this morning. Up in the chair.   Inpatient Medications    Scheduled Meds: . acetaminophen  1,000 mg Oral BID  . arformoterol  15 mcg Nebulization BID  . aspirin EC  81 mg Oral Daily  . atorvastatin  80 mg Oral q1800  . budesonide (PULMICORT) nebulizer solution  0.5 mg Nebulization BID  . calcium-vitamin D  2 tablet Oral Daily  . clopidogrel  75 mg Oral Q breakfast  . ferrous sulfate  325 mg Oral Q breakfast  . gabapentin  600 mg Oral QHS  . hydrALAZINE  10 mg Intravenous Once  . levothyroxine  100 mcg Oral QAC breakfast  . lidocaine  1 patch Transdermal Q24H  . mouth rinse  15 mL Mouth Rinse BID  . metoprolol tartrate  75 mg Oral BID  . multivitamin with minerals  1 tablet Oral Daily  . pantoprazole  40 mg Intravenous Q12H  . polyethylene glycol  17 g Oral Daily   Continuous Infusions:  PRN Meds: albuterol, alum & mag hydroxide-simeth, bisacodyl, calcium carbonate, hydrALAZINE, HYDROcodone-acetaminophen, nitroGLYCERIN, ondansetron (ZOFRAN) IV, sodium chloride flush, zolpidem   Vital Signs    Vitals:   10/03/17 0231 10/03/17 0810 10/03/17 0830 10/03/17 0922  BP: (!) 170/78 (!) 184/79  (!) 169/78  Pulse:    (!) 119  Resp: 18 20  20   Temp: 98.1 F (36.7 C)  97.9 F (36.6 C)    TempSrc: Oral Oral    SpO2: 98%  97%   Weight:      Height:        Intake/Output Summary (Last 24 hours) at 10/03/2017 0953 Last data filed at 10/03/2017 6063 Gross per 24 hour  Intake 240 ml  Output 1000 ml  Net -760 ml   Filed Weights   09/22/17 0500 09/26/17 2104 09/30/17 1035  Weight: 143 lb 15.4 oz (65.3 kg) 137 lb 2 oz (62.2 kg) 137 lb (62.1 kg)    Telemetry    ST? p waves are present - Personally Reviewed  ECG    ST - Personally Reviewed  Physical Exam   General: Well developed, thin older female appearing in no acute distress. Head: Normocephalic, atraumatic.  Neck: Supple, no JVD. Lungs:  Resp regular and unlabored, CTA. Heart: Tachy, S1, S2, no S3, S4, 3/6 systolic murmur; no rub. Abdomen: Soft, non-tender, non-distended with normoactive bowel sounds.  Extremities: No clubbing, cyanosis, edema. Distal pedal pulses are 2+ bilaterally. Neuro: Alert and oriented X 3. Moves all extremities spontaneously. Psych: Normal affect.  Labs    Chemistry Recent Labs  Lab 09/27/17 0034 10/02/17 0326  NA 137 136  K 4.4 3.9  CL 105 108  CO2 26 19*  GLUCOSE 152* 110*  BUN 48* 12  CREATININE 0.92 0.81  CALCIUM 8.9 8.4*  GFRNONAA 59* >  60  GFRAA >60 >60  ANIONGAP 6 9     Hematology Recent Labs  Lab 09/30/17 0544 10/01/17 0214 10/02/17 0326  WBC 12.4* 12.0* 11.5*  RBC 3.18* 2.97* 3.25*  HGB 9.1* 8.7* 9.5*  HCT 28.4* 26.9* 29.8*  MCV 89.3 90.6 91.7  MCH 28.6 29.3 29.2  MCHC 32.0 32.3 31.9  RDW 18.9* 19.3* 20.1*  PLT 240 250 309    Cardiac EnzymesNo results for input(s): TROPONINI in the last 168 hours. No results for input(s): TROPIPOC in the last 168 hours.   BNPNo results for input(s): BNP, PROBNP in the last 168 hours.   DDimer No results for input(s): DDIMER in the last 168 hours.    Radiology    No results found.  Cardiac Studies   LEFT HEART CATH AND CORONARY ANGIOGRAPHY3/14/19  Conclusion    Segmental proximal  to mid 90% RCA stenosis. This lesion was treated with stenting using overlapping Synergy 2.5 mm stents to postdilated to 3 mm in diameter with TIMI grade III flow. 0% stenosis was noted post procedure.  Ostial 50-60% circumflex narrowing.  Luminal irregularities throughout the proximal to mid LAD. Very distal eccentric tandem 70% stenoses near the apex. No focally high-grade stenosis is noted.  Normal left main  Low normal LV systolic function in the 69-67% range. Upper normal left ventricular end-diastolic pressure of 17 mmHg.  RECOMMENDATIONS:   Aspirin and Plavix for at least 6 months in the setting of chronic stable ischemic heart disease. Given overlapping stents it would be preferable to continue dual antiplatelet therapy for 12 months.  Aggressive risk factor modification using high intensity statin therapy for as long as tolerated. I have started atorvastatin 80 mg daily and discontinued pravastatin. Blood pressure control with target 130/80 mmHg or less.  Phase 2 cardiac rehab.  Overnight stay since the patient lives alone, is relatively frail, has had some bleeding from the radial cath site.       LEFT HEART CATH AND CORONARY ANGIOGRAPHY3/14/19  Conclusion    Acute occlusion of the mid right coronary distal to the previously placed stents from earlier in the day. Acute occlusion was accompanied by ventricular fibrillation. The patient was successfully resuscitated.  Successful recanalization of the right coronary and placement of an additional stent overlapping the distal margin of the previously placed stent. Final stent diameter 3.25 mm throughout the entire stented segment from distal to proximal. 3.5 mm diameter Weston balloon was used at the very proximal stent margin after intravascular ultrasound demonstrated decreased apposition. TIMI grade III flow with no evidence of stenosis postprocedure.  Intravascular ultrasound was used and demonstrated  acceptable stent apposition from distal to proximal with the exception of the proximal margin which was underexpanded. As noted above a 3.5 balloon was used to further expand the stent.  RECOMMENDATION:   Aggrastat for 10 hours.  Continue Plavix.  Critical care medicine to manage the patient's ventilator.  Continue IV Versed and fentanyl for sedation while on the ventilator.  Discussed the situation with family. Do not anticipate discharge for several days.  IV hydration and tract kidney function.    Patient Profile     77 y.o. female with abnormal coronary CT, CKD stage III, mild carotid stenosis, hypothyroidism, GERD, NSCSL status post right upper lobectomy/XRT who initially presented for elective cardiac catheterization on 3/14 and had a DES placed in the RCA. There wasan apparent dissection versus thrombosis following the initial cath that resulted in repeat cath and intubation with associated A. fib  arrest. A second stent was placed overlapping the distal of the previous stent in the RCA with grade 3 TIMI flow observed following placement of the first and second stents. Developed acute GI bleed requiring transfusion with cratered ulcers.    Assessment & Plan    1. Acute GI bleed:s/p EGD 3/21 with cratered ulcers and blood pooling in stomach; repeat EGD 3/23 with superficial non-bleeding ulcers. Hgb stable 9.1>8.7>>9.5 today (baseline 10.4).  - now on IV protonix BID, will plan to transition to oral as long as Hgb is stable. Labs pending. - GI signed off  2. CAD s/p PCI/stenting to pRCA:LHC 3/14 with stenting of RCA with subsequent acute vessel reocclusion with thrombosis distal to stent, most likely due to edge dissection. Reocclusion complicated by Vfib arrest requiring CPR and additional DES placed distal to the first stent.  - Continue ASA, plavix, and statin  3. Tachycardia:patient noted feeling her heart racing over the past few days. Appears to be ST as p waves  are noted.  - will further increase Metoprolol to 100mg  BID this morning.  - TSH was normal  4. HTN:BP elevated over the past 24 hours. Antihypertensives were held in the setting of acute GI bleed. Increased metoprolol to 100mg  BID this morning.  5. HLD: - continue high dose statin  6. COPD:with some SOB this AM - Continue inhaler therapy  7. Chest wall pain/ flail sternum:2/2 CPR. Improving - Continue tylenol prn  8. Urinary frequency/Dysuria:c/o burning with urination and increased frequency - check UA neg, UCx still pending this am. Addendum: Urine culture + for E. Coli and elevated WBC this afternoon at 16. Considered Rocephin but patient with pen allergy of anaphylaxis. Will order Cipro 200mg  IV q12 hr this afternoon.   9. Hx of [place] lung Ca:s/p RUL lobectomy in 2009, XRT to LUL nodule 2013, XRT for lymphadenopathy 2015, and Tarceva and thermal ablation in 2016. - Continue follow-up with Dr. Julien Nordmann   Dispo: Very deconditioned. PT/OT ordered but she was very nauseated from pain medication yesterday afternoon. Actually up in the chair this morning. Wants to work with PT today. CSW involved with looking for rehab placement.   Signed, Reino Bellis, NP  10/03/2017, 9:53 AM  Pager # [phone]   For questions or updates, please contact [place] Please consult [email or web address] for contact info under Cardiology/STEMI.

## 2017-10-04 LAB — BASIC METABOLIC PANEL
ANION GAP: 8 (ref 5–15)
BUN: 12 mg/dL (ref 6–20)
CALCIUM: 8.9 mg/dL (ref 8.9–10.3)
CO2: 24 mmol/L (ref 22–32)
Chloride: 105 mmol/L (ref 101–111)
Creatinine, Ser: 0.89 mg/dL (ref 0.44–1.00)
GFR calc non Af Amer: >60 mL/min (ref >60)
Glucose, Bld: 95 mg/dL (ref 65–99)
Potassium: 3.5 mmol/L (ref 3.5–5.1)
SODIUM: 137 mmol/L (ref 135–145)

## 2017-10-04 LAB — BRAIN NATRIURETIC PEPTIDE: B Natriuretic Peptide: 115.8 pg/mL — ABNORMAL HIGH (ref 0.0–100.0)

## 2017-10-04 LAB — CBC
HCT: 29.2 % — ABNORMAL LOW (ref 36.0–46.0)
HEMOGLOBIN: 9.2 g/dL — AB (ref 12.0–15.0)
MCH: 28.8 pg (ref 26.0–34.0)
MCHC: 31.5 g/dL (ref 30.0–36.0)
MCV: 91.5 fL (ref 78.0–100.0)
Platelets: 385 10*3/uL (ref 150–400)
RBC: 3.19 MIL/uL — AB (ref 3.87–5.11)
RDW: 19.3 % — ABNORMAL HIGH (ref 11.5–15.5)
WBC: 9.1 10*3/uL (ref 4.0–10.5)

## 2017-10-04 MED ORDER — SULFAMETHOXAZOLE-TRIMETHOPRIM 800-160 MG PO TABS
1.0000 | ORAL_TABLET | Freq: Two times a day (BID) | ORAL | Status: AC
  Administered 2017-10-04 – 2017-10-05 (×3): 1 via ORAL
  Filled 2017-10-04 (×3): qty 1

## 2017-10-04 MED ORDER — PANTOPRAZOLE SODIUM 40 MG PO TBEC
40.0000 mg | DELAYED_RELEASE_TABLET | Freq: Two times a day (BID) | ORAL | Status: DC
  Administered 2017-10-04 – 2017-10-17 (×27): 40 mg via ORAL
  Filled 2017-10-04 (×27): qty 1

## 2017-10-04 MED ORDER — SPIRONOLACTONE 12.5 MG HALF TABLET
12.5000 mg | ORAL_TABLET | Freq: Every day | ORAL | Status: DC
  Administered 2017-10-04: 12.5 mg via ORAL
  Filled 2017-10-04 (×3): qty 1

## 2017-10-04 MED ORDER — POTASSIUM CHLORIDE CRYS ER 20 MEQ PO TBCR
20.0000 meq | EXTENDED_RELEASE_TABLET | Freq: Two times a day (BID) | ORAL | Status: AC
  Administered 2017-10-04 (×2): 20 meq via ORAL
  Filled 2017-10-04 (×2): qty 1

## 2017-10-04 NOTE — Care Management Important Message (Signed)
Important Message  Patient Details  Name: [name] MRN: [id number] Date of Birth: [birth year]   Medicare Important Message Given:  Yes    Stephaine Breshears P [place] 10/04/2017, 2:25 PM

## 2017-10-04 NOTE — Progress Notes (Addendum)
The patient has been seen in conjunction with Reino Bellis, [facility]. All aspects of care have been considered and discussed. The patient has been personally interviewed, examined, and all clinical data has been reviewed.   Agree with note as outlined.  Added low-dose diuretic, Aldactone to help manage diastolic heart failure.  Noted systolic murmur on exam, related to mitral regurgitation.  Encourage the patient to work with PT/OT and close to discharge, possibly as early as tomorrow.  BP and heart rate improved after diuresis yesterday.  Progress Note  Patient Name: [name] Date of Encounter: 10/04/2017  Primary Cardiologist: Ena Dawley, MD  Subjective   Feeling better this morning.   Inpatient Medications    Scheduled Meds: . acetaminophen  1,000 mg Oral BID  . arformoterol  15 mcg Nebulization BID  . aspirin EC  81 mg Oral Daily  . atorvastatin  80 mg Oral q1800  . budesonide (PULMICORT) nebulizer solution  0.5 mg Nebulization BID  . calcium-vitamin D  2 tablet Oral Daily  . clopidogrel  75 mg Oral Q breakfast  . ferrous sulfate  325 mg Oral Q breakfast  . gabapentin  600 mg Oral QHS  . hydrALAZINE  10 mg Intravenous Once  . levothyroxine  100 mcg Oral QAC breakfast  . lidocaine  1 patch Transdermal Q24H  . mouth rinse  15 mL Mouth Rinse BID  . metoprolol tartrate  100 mg Oral BID  . multivitamin with minerals  1 tablet Oral Daily  . pantoprazole  40 mg Oral BID  . polyethylene glycol  17 g Oral Daily  . potassium chloride  20 mEq Oral BID  . spironolactone  12.5 mg Oral Daily   Continuous Infusions: . ciprofloxacin Stopped (10/04/17 0250)   PRN Meds: albuterol, alum & mag hydroxide-simeth, bisacodyl, calcium carbonate, hydrALAZINE, HYDROcodone-acetaminophen, nitroGLYCERIN, ondansetron (ZOFRAN) IV, zolpidem   Vital Signs    Vitals:   10/03/17 1933 10/03/17 2009 10/04/17 0004 10/04/17 0454  BP:  (!) 123/59 (!) 145/64 (!) 155/68  Pulse: 100  89    Resp: 20 20 18 18   Temp:  97.7 F (36.5 C) 97.8 F (36.6 C) 97.8 F (36.6 C)  TempSrc:  Oral Oral Oral  SpO2: 99% 99% 99% 96%  Weight:      Height:        Intake/Output Summary (Last 24 hours) at 10/04/2017 9562 Last data filed at 10/04/2017 0150 Gross per 24 hour  Intake 930 ml  Output 2200 ml  Net -1270 ml   Filed Weights   09/22/17 0500 09/26/17 2104 09/30/17 1035  Weight: 143 lb 15.4 oz (65.3 kg) 137 lb 2 oz (62.2 kg) 137 lb (62.1 kg)    Telemetry    SR - Personally Reviewed  ECG    N/a  - Personally Reviewed  Physical Exam   General: Well developed, well nourished, thin older female appearing in no acute distress. Head: Normocephalic, atraumatic.  Neck: Supple without bruits, JVD. Lungs:  Resp regular and unlabored, CTA. Heart: RRR, S1, S2, no S3, S4, 3/6 systolic murmur; no rub. Abdomen: Soft, non-tender, non-distended with normoactive bowel sounds.  Extremities: No clubbing, cyanosis, edema. Distal pedal pulses are 2+ bilaterally. Neuro: Alert and oriented X 3. Moves all extremities spontaneously. Psych: Normal affect.  Labs    Chemistry Recent Labs  Lab 10/02/17 0326 10/04/17 0444  NA 136 137  K 3.9 3.5  CL 108 105  CO2 19* 24  GLUCOSE 110* 95  BUN 12 12  CREATININE 0.81 0.89  CALCIUM 8.4* 8.9  GFRNONAA >60 >60  GFRAA >60 >60  ANIONGAP 9 8     Hematology Recent Labs  Lab 10/02/17 0326 10/03/17 1015 10/04/17 0444  WBC 11.5* 16.6* 9.1  RBC 3.25* 3.65* 3.19*  HGB 9.5* 10.8* 9.2*  HCT 29.8* 34.0* 29.2*  MCV 91.7 93.2 91.5  MCH 29.2 29.6 28.8  MCHC 31.9 31.8 31.5  RDW 20.1* 19.7* 19.3*  PLT 309 474* 385    Cardiac EnzymesNo results for input(s): TROPONINI in the last 168 hours. No results for input(s): TROPIPOC in the last 168 hours.   BNP Recent Labs  Lab 10/04/17 0444  BNP 115.8*     DDimer No results for input(s): DDIMER in the last 168 hours.    Radiology    No results found.  Cardiac Studies   LEFT HEART CATH  AND CORONARY ANGIOGRAPHY3/14/19  Conclusion    Segmental proximal to mid 90% RCA stenosis. This lesion was treated with stenting using overlapping Synergy 2.5 mm stents to postdilated to 3 mm in diameter with TIMI grade III flow. 0% stenosis was noted post procedure.  Ostial 50-60% circumflex narrowing.  Luminal irregularities throughout the proximal to mid LAD. Very distal eccentric tandem 70% stenoses near the apex. No focally high-grade stenosis is noted.  Normal left main  Low normal LV systolic function in the 24-09% range. Upper normal left ventricular end-diastolic pressure of 17 mmHg.  RECOMMENDATIONS:   Aspirin and Plavix for at least 6 months in the setting of chronic stable ischemic heart disease. Given overlapping stents it would be preferable to continue dual antiplatelet therapy for 12 months.  Aggressive risk factor modification using high intensity statin therapy for as long as tolerated. I have started atorvastatin 80 mg daily and discontinued pravastatin. Blood pressure control with target 130/80 mmHg or less.  Phase 2 cardiac rehab.  Overnight stay since the patient lives alone, is relatively frail, has had some bleeding from the radial cath site.       LEFT HEART CATH AND CORONARY ANGIOGRAPHY3/14/19  Conclusion    Acute occlusion of the mid right coronary distal to the previously placed stents from earlier in the day. Acute occlusion was accompanied by ventricular fibrillation. The patient was successfully resuscitated.  Successful recanalization of the right coronary and placement of an additional stent overlapping the distal margin of the previously placed stent. Final stent diameter 3.25 mm throughout the entire stented segment from distal to proximal. 3.5 mm diameter Cash balloon was used at the very proximal stent margin after intravascular ultrasound demonstrated decreased apposition. TIMI grade III flow with no evidence of stenosis  postprocedure.  Intravascular ultrasound was used and demonstrated acceptable stent apposition from distal to proximal with the exception of the proximal margin which was underexpanded. As noted above a 3.5 balloon was used to further expand the stent.  RECOMMENDATION:   Aggrastat for 10 hours.  Continue Plavix.  Critical care medicine to manage the patient's ventilator.  Continue IV Versed and fentanyl for sedation while on the ventilator.  Discussed the situation with family. Do not anticipate discharge for several days.  IV hydration and tract kidney function.    Patient Profile     77 y.o. female with abnormal coronary CT, CKD stage III, mild carotid stenosis, hypothyroidism, GERD, NSCSL status post right upper lobectomy/XRT who initially presented for elective cardiac catheterization on 3/14 and had a DES placed in the RCA. There wasan apparent dissection versus thrombosis following the initial cath  that resulted in repeat cath and intubation with associated A. fib arrest. A second stent was placed overlapping the distal of the previous stent in the RCA with grade 3 TIMI flow observed following placement of the first and second stents. Developed acute GI bleed requiring transfusion with cratered ulcers.   Assessment & Plan    1. Acute GI bleed:s/p EGD 3/21 with cratered ulcers and blood pooling in stomach; repeat EGD 3/23 with superficial non-bleeding ulcers. Hgb stable at 9.2 today.   - on PO Protonix 40mg  BID.  - GI signed off  2. CAD s/p PCI/stenting to pRCA:LHC 3/14 with stenting of RCA with subsequent acute vessel reocclusion with thrombosis distal to stent, most likely due to edge dissection. Reocclusion complicated by Vfib arrest requiring CPR and additional DES placed distal to the first stent.  - Continue ASA, plavix, and statin  3. Tachycardia:this has significantly improved today. Will continue with Metoprolol 100mg  BID. - TSH was normal  4. HTN:BP  elevated over the past 24 hours. Antihypertensives were held in the setting of acute GI bleed.Increased metoprolol to 100mg  BID this morning.  5. HLD: - continue high dose statin  6. COPD:stable. - Continue inhaler therapy  7. Chest wall pain/ flail sternum:2/2 CPR. Improving but still sore.  - Continue tylenol prn  8. Urinary frequency/Dysuria:c/o burning with urination and increased frequency - Urine culture + for E. Coli and elevated WBC at 16 yesterday. Placed on IV cipro yesterday day 2/3 treatment. WBC improved today.    9. Hx of [place] lung Ca:s/p RUL lobectomy in 2009, XRT to LUL nodule 2013, XRT for lymphadenopathy 2015, and Tarceva and thermal ablation in 2016. - Continue follow-up with Dr. Julien Nordmann  Dispo: Very deconditioned. PT/OT ordered and she worked with both yesterday. Planning for SNF at the time of DC. CSW working on placement.    Signed, Reino Bellis, NP  10/04/2017, 8:32 AM  Pager # [phone]   For questions or updates, please contact [place] Please consult [email or web address] for contact info under Cardiology/STEMI.

## 2017-10-04 NOTE — Progress Notes (Signed)
Physical Therapy Treatment Patient Details Name: [name] MRN: [id number] DOB: July 27, 1940 Today's Date: 10/04/2017    History of Present Illness Pt is a 77 y.o. female admitted 09/21/17 for elective cardiac cath. There wasan apparent dissection versus thrombosis following the initial cath that resulted in repeat cath 3/15 and intubation with associated A-fib arrest requiring CPR; a second stent was placed. Pt with acute GI bleed, s/p EGD with propofol 3/21. PMH includes CKD III, HTN, COPD, non-small cell lung cancer (radiation 2013, 2015), arthritis.    PT Comments    Pt performed increased gait during session this afternoon.  Pt appears to be in better spirits.  Pt motivated to walk in halls today.  Pt followed with chair as she was nervous to progress mobility.  Pt remains limited due to strength deficits and guarded due to pain from CPR.  Pt will continue to benefit from short stay at SNF to improve strength and function before returning to private residence.   Follow Up Recommendations  SNF;Supervision for mobility/OOB     Equipment Recommendations  Rolling walker with 5" wheels    Recommendations for Other Services       Precautions / Restrictions Precautions Precautions: Fall Precaution Comments: Chest soreness/brusing post-CPR Restrictions Weight Bearing Restrictions: No    Mobility  Bed Mobility Overal bed mobility: Needs Assistance Bed Mobility: Sit to Supine       Sit to supine: Min guard   General bed mobility comments: Pt returned to supine post tx and unsure if she would be able to perform.  Min guard for safety and tactile cueing.  Once in supine scooted patient to [facility] and positioned to comfort.    Transfers Overall transfer level: Needs assistance Equipment used: Rolling walker (2 wheeled) Transfers: Sit to/from Stand Sit to Stand: Min guard Stand pivot transfers: Min guard       General transfer comment: Cues for hand placement to and from  seated surface.  Pt performed multiple trials during session this afternoon.   Ambulation/Gait Ambulation/Gait assistance: Min guard Ambulation Distance (Feet): 140 Feet(x2 trials. ) Assistive device: Rolling walker (2 wheeled) Gait Pattern/deviations: Step-through pattern;Shuffle;Narrow base of support Gait velocity: Decreased Gait velocity interpretation: Below normal speed for age/gender General Gait Details: Pt remains slow and guarded but motivated to improve.  Pt required cues for pacing and posture.  Cues to relax shoulders and rely more on strength of her legs.  Close chair follow for safety.     Stairs            Wheelchair Mobility    Modified Rankin (Stroke Patients Only)       Balance Overall balance assessment: Needs assistance   Sitting balance-Leahy Scale: Good       Standing balance-Leahy Scale: Fair                              Cognition Arousal/Alertness: Awake/alert Behavior During Therapy: WFL for tasks assessed/performed Overall Cognitive Status: Within Functional Limits for tasks assessed                                        Exercises      General Comments        Pertinent Vitals/Pain Pain Assessment: 0-10 Pain Score: 3  Pain Location: Chest bruising Pain Descriptors / Indicators: Sore;Guarding Pain Intervention(s): Monitored during session;Repositioned  Home Living                      Prior Function            PT Goals (current goals can now be found in the care plan section) Acute Rehab PT Goals Patient Stated Goal: Return home Potential to Achieve Goals: Fair Progress towards PT goals: Progressing toward goals    Frequency    Min 3X/week      PT Plan Current plan remains appropriate    Co-evaluation              AM-PAC PT "6 Clicks" Daily Activity  Outcome Measure  Difficulty turning over in bed (including adjusting bedclothes, sheets and blankets)?: A  Little Difficulty moving from lying on back to sitting on the side of the bed? : Unable Difficulty sitting down on and standing up from a chair with arms (e.g., wheelchair, bedside commode, etc,.)?: Unable Help needed moving to and from a bed to chair (including a wheelchair)?: A Little Help needed walking in hospital room?: A Little Help needed climbing 3-5 steps with a railing? : A Little 6 Click Score: 14    End of Session Equipment Utilized During Treatment: Gait belt Activity Tolerance: Patient limited by fatigue   Nurse Communication: Mobility status PT Visit Diagnosis: Other abnormalities of gait and mobility (R26.89)     Time: 1430-1450 PT Time Calculation (min) (ACUTE ONLY): 20 min  Charges:  $Gait Training: 8-22 mins                    G Codes:       Governor Rooks, PTA pager [place] 10/04/2017, 6:04 PM

## 2017-10-04 NOTE — Progress Notes (Addendum)
Clinical Social Worker following patient for discharge needs. Patient has bed at [facility] once medically cleared by MD. CSW made patients NP aware of bed availability    Rhea Pink, MSW,  [place]

## 2017-10-05 LAB — BASIC METABOLIC PANEL
ANION GAP: 5 (ref 5–15)
BUN: 39 mg/dL — ABNORMAL HIGH (ref 6–20)
CALCIUM: 8.5 mg/dL — AB (ref 8.9–10.3)
CO2: 23 mmol/L (ref 22–32)
Chloride: 109 mmol/L (ref 101–111)
Creatinine, Ser: 0.86 mg/dL (ref 0.44–1.00)
Glucose, Bld: 112 mg/dL — ABNORMAL HIGH (ref 65–99)
Potassium: 4.7 mmol/L (ref 3.5–5.1)
SODIUM: 137 mmol/L (ref 135–145)

## 2017-10-05 LAB — CBC
HEMATOCRIT: 23.7 % — AB (ref 36.0–46.0)
Hemoglobin: 7.4 g/dL — ABNORMAL LOW (ref 12.0–15.0)
MCH: 29 pg (ref 26.0–34.0)
MCHC: 31.2 g/dL (ref 30.0–36.0)
MCV: 92.9 fL (ref 78.0–100.0)
Platelets: 438 10*3/uL — ABNORMAL HIGH (ref 150–400)
RBC: 2.55 MIL/uL — ABNORMAL LOW (ref 3.87–5.11)
RDW: 19.2 % — AB (ref 11.5–15.5)
WBC: 13.1 10*3/uL — AB (ref 4.0–10.5)

## 2017-10-05 LAB — PREPARE RBC (CROSSMATCH)

## 2017-10-05 MED ORDER — SODIUM CHLORIDE 0.9 % IV SOLN
Freq: Once | INTRAVENOUS | Status: AC
  Administered 2017-10-05: 09:00:00 via INTRAVENOUS

## 2017-10-05 NOTE — Progress Notes (Addendum)
The patient has been seen in conjunction with Reino Bellis, [facility]. All aspects of care have been considered and discussed. The patient has been personally interviewed, examined, and all clinical data has been reviewed.   Was being prepared for discharge to rehab center.  Had melena last night.  Hemoglobin has dropped overnight from around 9 to 7.5.  He denies angina.  Heart rate is elevated.  Recurrent upper GI bleed with melena  Plan transfusion.  Will reconsult GI to see if there is anything further we can do.  Will check a P2 Y 12 and if it appears we are getting good inhibition with Plavix, would discontinue aspirin therapy.  Guarded prognosis.  Progress Note  Patient Name: [name] Date of Encounter: 10/05/2017  Primary Cardiologist: Ena Dawley, MD  Subjective   Feeling weak again this morning. Pale, tired and frustrated.   Inpatient Medications    Scheduled Meds: . acetaminophen  1,000 mg Oral BID  . arformoterol  15 mcg Nebulization BID  . aspirin EC  81 mg Oral Daily  . atorvastatin  80 mg Oral q1800  . budesonide (PULMICORT) nebulizer solution  0.5 mg Nebulization BID  . calcium-vitamin D  2 tablet Oral Daily  . clopidogrel  75 mg Oral Q breakfast  . ferrous sulfate  325 mg Oral Q breakfast  . gabapentin  600 mg Oral QHS  . hydrALAZINE  10 mg Intravenous Once  . levothyroxine  100 mcg Oral QAC breakfast  . lidocaine  1 patch Transdermal Q24H  . mouth rinse  15 mL Mouth Rinse BID  . metoprolol tartrate  100 mg Oral BID  . multivitamin with minerals  1 tablet Oral Daily  . pantoprazole  40 mg Oral BID  . polyethylene glycol  17 g Oral Daily  . spironolactone  12.5 mg Oral Daily  . sulfamethoxazole-trimethoprim  1 tablet Oral Q12H   Continuous Infusions:  PRN Meds: albuterol, alum & mag hydroxide-simeth, bisacodyl, calcium carbonate, hydrALAZINE, HYDROcodone-acetaminophen, nitroGLYCERIN, ondansetron (ZOFRAN) IV, zolpidem   Vital Signs      Vitals:   10/04/17 0847 10/04/17 2102 10/05/17 0023 10/05/17 0414  BP:  135/77 (!) 135/55 134/61  Pulse:  (!) 108 92 98  Resp:   20 20  Temp:  97.7 F (36.5 C) (!) 97.5 F (36.4 C) 98.3 F (36.8 C)  TempSrc:  Oral Oral Oral  SpO2: 100% 100% 100% 100%  Weight:      Height:        Intake/Output Summary (Last 24 hours) at 10/05/2017 0856 Last data filed at 10/05/2017 9417 Gross per 24 hour  Intake 1358.75 ml  Output 1001 ml  Net 357.75 ml   Filed Weights   09/22/17 0500 09/26/17 2104 09/30/17 1035  Weight: 143 lb 15.4 oz (65.3 kg) 137 lb 2 oz (62.2 kg) 137 lb (62.1 kg)    Telemetry    ST - Personally Reviewed  ECG    N/a - Personally Reviewed  Physical Exam   General: Frail older female appearing in no acute distress, pale. Head: Normocephalic, atraumatic.  Neck: Supple without bruits, JVD. Lungs:  Resp regular and unlabored, CTA. Heart: Tachy, S1, S2, no S3, S4, systolic murmur; no rub. Abdomen: Soft, non-tender, non-distended with normoactive bowel sounds. Extremities: No clubbing, cyanosis, edema. Distal pedal pulses are 2+ bilaterally. Neuro: Alert and oriented X 3. Moves all extremities spontaneously. Psych: Normal affect.  Labs    Chemistry Recent Labs  Lab 10/02/17 [phone] 10/04/17 4481 10/05/17 8563  NA 136 137 137  K 3.9 3.5 4.7  CL 108 105 109  CO2 19* 24 23  GLUCOSE 110* 95 112*  BUN 12 12 39*  CREATININE 0.81 0.89 0.86  CALCIUM 8.4* 8.9 8.5*  GFRNONAA >60 >60 >60  GFRAA >60 >60 >60  ANIONGAP 9 8 5      Hematology Recent Labs  Lab 10/03/17 1015 10/04/17 0444 10/05/17 0318  WBC 16.6* 9.1 13.1*  RBC 3.65* 3.19* 2.55*  HGB 10.8* 9.2* 7.4*  HCT 34.0* 29.2* 23.7*  MCV 93.2 91.5 92.9  MCH 29.6 28.8 29.0  MCHC 31.8 31.5 31.2  RDW 19.7* 19.3* 19.2*  PLT 474* 385 438*    Cardiac EnzymesNo results for input(s): TROPONINI in the last 168 hours. No results for input(s): TROPIPOC in the last 168 hours.   BNP Recent Labs  Lab  10/04/17 0444  BNP 115.8*     DDimer No results for input(s): DDIMER in the last 168 hours.    Radiology    No results found.  Cardiac Studies   LEFT HEART CATH AND CORONARY ANGIOGRAPHY3/14/19  Conclusion    Segmental proximal to mid 90% RCA stenosis. This lesion was treated with stenting using overlapping Synergy 2.5 mm stents to postdilated to 3 mm in diameter with TIMI grade III flow. 0% stenosis was noted post procedure.  Ostial 50-60% circumflex narrowing.  Luminal irregularities throughout the proximal to mid LAD. Very distal eccentric tandem 70% stenoses near the apex. No focally high-grade stenosis is noted.  Normal left main  Low normal LV systolic function in the 00-17% range. Upper normal left ventricular end-diastolic pressure of 17 mmHg.  RECOMMENDATIONS:   Aspirin and Plavix for at least 6 months in the setting of chronic stable ischemic heart disease. Given overlapping stents it would be preferable to continue dual antiplatelet therapy for 12 months.  Aggressive risk factor modification using high intensity statin therapy for as long as tolerated. I have started atorvastatin 80 mg daily and discontinued pravastatin. Blood pressure control with target 130/80 mmHg or less.  Phase 2 cardiac rehab.  Overnight stay since the patient lives alone, is relatively frail, has had some bleeding from the radial cath site.       LEFT HEART CATH AND CORONARY ANGIOGRAPHY3/14/19  Conclusion    Acute occlusion of the mid right coronary distal to the previously placed stents from earlier in the day. Acute occlusion was accompanied by ventricular fibrillation. The patient was successfully resuscitated.  Successful recanalization of the right coronary and placement of an additional stent overlapping the distal margin of the previously placed stent. Final stent diameter 3.25 mm throughout the entire stented segment from distal to proximal. 3.5 mm diameter  Sherrill balloon was used at the very proximal stent margin after intravascular ultrasound demonstrated decreased apposition. TIMI grade III flow with no evidence of stenosis postprocedure.  Intravascular ultrasound was used and demonstrated acceptable stent apposition from distal to proximal with the exception of the proximal margin which was underexpanded. As noted above a 3.5 balloon was used to further expand the stent.  RECOMMENDATION:   Aggrastat for 10 hours.  Continue Plavix.  Critical care medicine to manage the patient's ventilator.  Continue IV Versed and fentanyl for sedation while on the ventilator.  Discussed the situation with family. Do not anticipate discharge for several days.  IV hydration and tract kidney function.    Patient Profile     77 y.o. female with abnormal coronary CT, CKD stage III, mild carotid stenosis,  hypothyroidism, GERD, NSCSL status post right upper lobectomy/XRT who initially presented for elective cardiac catheterization on 3/14 and had a DES placed in the RCA. There wasan apparent dissection versus thrombosis following the initial cath that resulted in repeat cath and intubation with associated A. fib arrest. A second stent was placed overlapping the distal of the previous stent in the RCA with grade 3 TIMI flow observed following placement of the first and second stents. Developed acute GI bleed requiring transfusion with cratered ulcers.  Assessment & Plan    1. Acute GI bleed:s/p EGD 3/21 with cratered ulcers and blood pooling in stomach; repeat EGD 3/23 with superficial non-bleeding ulcers. Hgb stable at 9.2 yesterday, had another episode of melena overnight and hgb dropped to 7.4 this admission.   - transfuse 1 unit PRBCs, may need GI input again given reoccurrence? Discuss with MD.   2. CAD s/p PCI/stenting to pRCA:LHC 3/14 with stenting of RCA with subsequent acute vessel reocclusion with thrombosis distal to stent, most likely due  to edge dissection. Reocclusion complicated by Vfib arrest requiring CPR and additional DES placed distal to the first stent.  - Continue ASA, plavix, and statin  3. Tachycardia:heart rate slightly elevated again this morning in the setting of acute anemia.  - TSH was normal  4. HTN:BP stable this morning. Will hold am dose of BB given acute blood loss anemia.   5. HLD: - continue high dose statin  6. COPD:stable. - Continue inhaler therapy  7. Chest wall pain/ flail sternum:2/2 CPR. Improving but still sore.  - Continue tylenol prn  8. Urinary frequency/Dysuria:c/o burning with urination and increased frequency - Urine culture + for E. Coli and elevated WBC at 16. Placed on IV cipro then transitioned to Bactrim day 3/3.   9. Hx of [place] lung Ca:s/p RUL lobectomy in 2009, XRT to LUL nodule 2013, XRT for lymphadenopathy 2015, and Tarceva and thermal ablation in 2016. - Continue follow-up with Dr. Julien Nordmann  Dispo:Very deconditioned. PT/OT ordered and she worked with both yesterday and actually did very well. Now with acute blood loss anemia this morning requiring additional transfusion. Has a bed at SNF but will be delayed given events overnight.   Signed, Reino Bellis, NP  10/05/2017, 8:56 AM  Pager # [phone]   For questions or updates, please contact [place] Please consult [email or web address] for contact info under Cardiology/STEMI.

## 2017-10-05 NOTE — Progress Notes (Signed)
Clinical Social Worker following patient for disposition needs. Patient has bed at [facility] once medically stable. Facility is aware that patient will be unable to transition to facility today due to patients blood loss. CSW will continue to follow patient for needs.   Rhea Pink, MSW,  [place]

## 2017-10-05 NOTE — Progress Notes (Signed)
Subjective: Melena yesterday.  Objective: Vital signs in last 24 hours: Temp:  [97.5 F (36.4 C)-98.4 F (36.9 C)] 98.4 F (36.9 C) (03/28 1241) Pulse Rate:  [92-108] 98 (03/28 0414) Resp:  [20-24] 24 (03/28 1241) BP: (95-135)/(51-77) 95/51 (03/28 1241) SpO2:  [97 %-100 %] 98 % (03/28 1241) Weight change:  Last BM Date: 10/01/17  PE: GEN:  NAD, somewhat weak- and deconditioned-appearing  Lab Results: CBC    Component Value Date/Time   WBC 13.1 (H) 10/05/2017 0318   RBC 2.55 (L) 10/05/2017 0318   HGB 7.4 (L) 10/05/2017 0318   HGB 10.4 (L) 09/20/2017 0958   HGB 12.3 05/29/2017 1136   HCT 23.7 (L) 10/05/2017 0318   HCT 32.3 (L) 09/20/2017 0958   HCT 37.9 05/29/2017 1136   PLT 438 (H) 10/05/2017 0318   PLT 330 09/20/2017 0958   MCV 92.9 10/05/2017 0318   MCV 81 09/20/2017 0958   MCV 91.0 05/29/2017 1136   MCH 29.0 10/05/2017 0318   MCHC 31.2 10/05/2017 0318   RDW 19.2 (H) 10/05/2017 0318   RDW 15.3 09/20/2017 0958   RDW 13.4 05/29/2017 1136   LYMPHSABS 2.4 09/24/2017 0221   LYMPHSABS 1.6 05/29/2017 1136   MONOABS 2.0 (H) 09/24/2017 0221   MONOABS 0.8 05/29/2017 1136   EOSABS 0.2 09/24/2017 0221   EOSABS 0.9 (H) 05/29/2017 1136   BASOSABS 0.0 09/24/2017 0221   BASOSABS 0.1 05/29/2017 1136   CMP     Component Value Date/Time   NA 137 10/05/2017 0318   NA 137 09/20/2017 0958   NA 141 05/29/2017 1136   K 4.7 10/05/2017 0318   K 4.5 05/29/2017 1136   CL 109 10/05/2017 0318   CL 105 07/17/2012 1434   CO2 23 10/05/2017 0318   CO2 28 05/29/2017 1136   GLUCOSE 112 (H) 10/05/2017 0318   GLUCOSE 89 05/29/2017 1136   GLUCOSE 133 (H) 07/17/2012 1434   BUN 39 (H) 10/05/2017 0318   BUN 25 09/20/2017 0958   BUN 26.1 (H) 05/29/2017 1136   CREATININE 0.86 10/05/2017 0318   CREATININE 1.3 (H) 05/29/2017 1136   CALCIUM 8.5 (L) 10/05/2017 0318   CALCIUM 10.4 05/29/2017 1136   PROT 6.3 (L) 09/04/2017 1053   PROT 6.1 08/22/2017 1221   PROT 6.4 05/29/2017 1136   ALBUMIN  2.4 (L) 09/21/2017 2138   ALBUMIN 4.2 08/22/2017 1221   ALBUMIN 3.5 05/29/2017 1136   AST 13 09/04/2017 1053   AST 15 05/29/2017 1136   ALT 11 09/04/2017 1053   ALT 12 05/29/2017 1136   ALKPHOS 79 09/04/2017 1053   ALKPHOS 59 05/29/2017 1136   BILITOT 0.5 09/04/2017 1053   BILITOT 0.5 08/22/2017 1221   BILITOT 0.51 05/29/2017 1136   GFRNONAA >60 10/05/2017 0318   GFRAA >60 10/05/2017 0318   Assessment:  1.  Melena, recurrent. 2.  Anemia, recurrent, transfusion-requiring.  Plan:  1.  Continue supportive care (serial CBCs, transfusion as needed, PPI). 2.  Had extensive discussion with patient and her son.  Patient has had two endoscopies for same reason (melena, anemia), both only showing shallow and clean-based gastric ulcers without active bleeding and without focal bleeding site identified.  In light of this, we are all wary of performing more invasive procedures unless the odds of finding a bleeding culprit are relatively high.   Thus, will do capsule endoscopy tomorrow as a non-invasive next step to see if there is concomitant bleeding culprit located in small bowel.  We also discussed possible  need for colonoscopy, but patient is very much against this at the present time.   It may ultimately turn out that patient has diffuse and non-focal and non endoscopically treatable mucosal friability from intensive antiplatelet therapy. 3.  [facility] will follow.   [name] 10/05/2017, 1:49 PM   Cell [phone] If no answer or after 5 PM call [phone]

## 2017-10-06 ENCOUNTER — Encounter (HOSPITAL_COMMUNITY): Payer: Self-pay

## 2017-10-06 ENCOUNTER — Encounter (HOSPITAL_COMMUNITY)
Admission: RE | Disposition: A | Discharge status: Skilled Nursing Facility | Payer: Self-pay | Source: Ambulatory Visit | Attending: Interventional Cardiology

## 2017-10-06 HISTORY — PX: GIVENS CAPSULE STUDY: SHX5432

## 2017-10-06 LAB — BASIC METABOLIC PANEL
Anion gap: 5 (ref 5–15)
BUN: 39 mg/dL — AB (ref 6–20)
CHLORIDE: 111 mmol/L (ref 101–111)
CO2: 22 mmol/L (ref 22–32)
CREATININE: 1.3 mg/dL — AB (ref 0.44–1.00)
Calcium: 8.5 mg/dL — ABNORMAL LOW (ref 8.9–10.3)
GFR calc non Af Amer: 39 mL/min — ABNORMAL LOW (ref >60)
GFR, EST AFRICAN AMERICAN: 45 mL/min — AB (ref >60)
Glucose, Bld: 95 mg/dL (ref 65–99)
Potassium: 4.2 mmol/L (ref 3.5–5.1)
Sodium: 138 mmol/L (ref 135–145)

## 2017-10-06 LAB — BPAM RBC
Blood Product Expiration Date: 201904152359
ISSUE DATE / TIME: 201903281159
UNIT TYPE AND RH: 1700

## 2017-10-06 LAB — CBC
HEMATOCRIT: 23.9 % — AB (ref 36.0–46.0)
HEMOGLOBIN: 7.7 g/dL — AB (ref 12.0–15.0)
MCH: 29.5 pg (ref 26.0–34.0)
MCHC: 32.2 g/dL (ref 30.0–36.0)
MCV: 91.6 fL (ref 78.0–100.0)
Platelets: 338 10*3/uL (ref 150–400)
RBC: 2.61 MIL/uL — ABNORMAL LOW (ref 3.87–5.11)
RDW: 17.6 % — ABNORMAL HIGH (ref 11.5–15.5)
WBC: 13.1 10*3/uL — ABNORMAL HIGH (ref 4.0–10.5)

## 2017-10-06 LAB — TYPE AND SCREEN
ABO/RH(D): B NEG
ANTIBODY SCREEN: NEGATIVE
Unit division: 0

## 2017-10-06 LAB — PLATELET INHIBITION P2Y12: Platelet Function  P2Y12: 7 [PRU] — ABNORMAL LOW (ref 194–418)

## 2017-10-06 SURGERY — IMAGING PROCEDURE, GI TRACT, INTRALUMINAL, VIA CAPSULE
Anesthesia: LOCAL

## 2017-10-06 SURGICAL SUPPLY — 1 items: TOWEL COTTON PACK 4EA (MISCELLANEOUS) ×4 IMPLANT

## 2017-10-06 NOTE — Progress Notes (Signed)
CSW following patient and family for support and disposition needs. Patient and family would like patient to transition to [facility] once medically stable. Patients son Domenic Moras) is aware that if patient discharges this weekend that [facility] possibly will not have a bed available for patient. John stated he is aware of the possibility and will take it day by day since patient is not medically stable.   Rhea Pink, MSW,  [place]

## 2017-10-06 NOTE — Progress Notes (Addendum)
Progress Note  Patient Name: [name] Date of Encounter: 10/06/2017  Primary Cardiologist: Ena Dawley, MD   Subjective   Currently in her room but being instructed on pill endoscopy..  Have reviewed the GI note and recommendations with appreciation.  No melena today.  Inpatient Medications    Scheduled Meds: . acetaminophen  1,000 mg Oral BID  . arformoterol  15 mcg Nebulization BID  . atorvastatin  80 mg Oral q1800  . budesonide (PULMICORT) nebulizer solution  0.5 mg Nebulization BID  . calcium-vitamin D  2 tablet Oral Daily  . clopidogrel  75 mg Oral Q breakfast  . ferrous sulfate  325 mg Oral Q breakfast  . gabapentin  600 mg Oral QHS  . hydrALAZINE  10 mg Intravenous Once  . levothyroxine  100 mcg Oral QAC breakfast  . lidocaine  1 patch Transdermal Q24H  . mouth rinse  15 mL Mouth Rinse BID  . metoprolol tartrate  100 mg Oral BID  . multivitamin with minerals  1 tablet Oral Daily  . pantoprazole  40 mg Oral BID  . polyethylene glycol  17 g Oral Daily   Continuous Infusions:  PRN Meds: albuterol, alum & mag hydroxide-simeth, bisacodyl, calcium carbonate, hydrALAZINE, HYDROcodone-acetaminophen, nitroGLYCERIN, ondansetron (ZOFRAN) IV, zolpidem   Vital Signs    Vitals:   10/06/17 0513 10/06/17 0754 10/06/17 0918 10/06/17 1303  BP: (!) 124/53 (!) 132/47  (!) 142/54  Pulse:  93 95 (!) 110  Resp: 18 19 20  (!) 22  Temp: 97.6 F (36.4 C) 98.3 F (36.8 C)    TempSrc: Oral Oral  Oral  SpO2:   98%   Weight:      Height:        Intake/Output Summary (Last 24 hours) at 10/06/2017 1657 Last data filed at 10/06/2017 1544 Gross per 24 hour  Intake 240 ml  Output 200 ml  Net 40 ml   Filed Weights   09/22/17 0500 09/26/17 2104 09/30/17 1035  Weight: 143 lb 15.4 oz (65.3 kg) 137 lb 2 oz (62.2 kg) 137 lb (62.1 kg)    Telemetry    Sinus tachycardia- Personally Reviewed  ECG    No new tracing- Personally Reviewed  Physical Exam  Blood pressure is  improved compared to earlier today.  Heart rate is improved on metoprolol therapy. Not as pale as yesterday.  Marked ecchymoses on both arms. Neck veins are flat. Still with dyssynchronous sternal movement with breathing related to CPR and sternal fracture. Chest is clear posteriorly. An S4 gallop was heard on auscultation. Neuro exam is unremarkable   Labs    Chemistry Recent Labs  Lab 10/04/17 0444 10/05/17 0318 10/06/17 0316  NA 137 137 138  K 3.5 4.7 4.2  CL 105 109 111  CO2 24 23 22   GLUCOSE 95 112* 95  BUN 12 39* 39*  CREATININE 0.89 0.86 1.30*  CALCIUM 8.9 8.5* 8.5*  GFRNONAA >60 >60 39*  GFRAA >60 >60 45*  ANIONGAP 8 5 5      Hematology Recent Labs  Lab 10/04/17 0444 10/05/17 0318 10/06/17 0316  WBC 9.1 13.1* 13.1*  RBC 3.19* 2.55* 2.61*  HGB 9.2* 7.4* 7.7*  HCT 29.2* 23.7* 23.9*  MCV 91.5 92.9 91.6  MCH 28.8 29.0 29.5  MCHC 31.5 31.2 32.2  RDW 19.3* 19.2* 17.6*  PLT 385 438* 338    Cardiac EnzymesNo results for input(s): TROPONINI in the last 168 hours. No results for input(s): TROPIPOC in the last 168 hours.  BNP Recent Labs  Lab 10/04/17 0444  BNP 115.8*     DDimer No results for input(s): DDIMER in the last 168 hours.   Radiology    No results found.  Cardiac Studies   P2 Y 12 assay is 7.  Patient Profile     77 y.o. female with abnormal coronary CT, CKD stage III, mild carotid stenosis, hypothyroidism, GERD, NSCSL status post right upper lobectomy/XRT who initially presented for elective cardiac catheterization on 3/14 and had a DES placed in the RCA.  There was an apparent dissection versus thrombosis following the initial cath that resulted in repeat cath and intubation with associated V fib arrest.  A second stent was placed overlapping the distal of the previous stent in the RCA with grade 3 TIMI flow.  Subsequent hospital course has been complicated by acute GI bleeding with melena, anemia requiring multiple transfusions, and  identification of nonbleeding gastric ulcers.    Assessment & Plan    1. Coronary artery disease, with presentation stable ischemic heart disease.  CT FFR identified significant reduction in flow and RCA stent was placed, complicated by late acute vessel closures 6 hours after initial stenting.  Treated with additional stents placed distally.  Stable from recurrent anginal standpoint. 2. Post hemorrhagic anemia related to upper GI bleeding requiring multiple transfusions.  Have been stable for greater than 72 hours until yesterday when melena recurred requiring additional transfusion.  Appreciate GI input.  The P2Y12 is is low.  This suggests that she is responding to Plavix as a platelet inhibitor.  After discussing with the patient, I have decided to discontinue aspirin therapy.  We will repeat P2 Y 12 at least once over this weekend to be certain that the current value is not an error. 3. Hypertension, better controlled 4. Tachycardia, requiring high-dose beta-blocker therapy for control. 5. COPD, contributing to dyspnea. 6. Status post sternal fracture with CPR, improving. 7. Acute kidney injury.  Discontinue spironolactone.  Trend electrolytes and kidney function.  Overall plan is continued support with transfusions as required.  When stable will be discharged to a rehab center.  With P2 Y 12 less than 10, will discontinue aspirin and continue Plavix as monotherapy to prevent stent thrombosis.  Repeat P2 Y 12 tomorrow or Sunday.   For questions or updates, please contact [place] Please consult [email or web address] for contact info under Cardiology/STEMI.      Signed, Sinclair Grooms, MD  10/06/2017, 4:57 PM

## 2017-10-06 NOTE — Progress Notes (Signed)
PT Cancellation Note  Patient Details Name: [name] MRN: [id number] DOB: [birth year]   Cancelled Treatment:    Reason Eval/Treat Not Completed: Patient at procedure or test/unavailable. Will follow-up for PT treatment after the weekend.  Mabeline Caras, PT, DPT Acute Rehab Services  Pager: [place] 10/06/2017, 8:27 AM

## 2017-10-06 NOTE — Progress Notes (Signed)
Patient swallowed capsule today; data will be downloaded to computer tomorrow and we can review over the weekend.  Dr. Michail Sermon covering this weekend.

## 2017-10-06 NOTE — Progress Notes (Signed)
Pt swallowed pill cam, tolerated well. Verb understanding of instructions.  RN given instructions.

## 2017-10-07 LAB — BASIC METABOLIC PANEL
ANION GAP: 4 — AB (ref 5–15)
BUN: 22 mg/dL — ABNORMAL HIGH (ref 6–20)
CO2: 23 mmol/L (ref 22–32)
Calcium: 8.8 mg/dL — ABNORMAL LOW (ref 8.9–10.3)
Chloride: 109 mmol/L (ref 101–111)
Creatinine, Ser: 1.14 mg/dL — ABNORMAL HIGH (ref 0.44–1.00)
GFR, EST AFRICAN AMERICAN: 53 mL/min — AB (ref >60)
GFR, EST NON AFRICAN AMERICAN: 46 mL/min — AB (ref >60)
Glucose, Bld: 98 mg/dL (ref 65–99)
POTASSIUM: 4.1 mmol/L (ref 3.5–5.1)
SODIUM: 136 mmol/L (ref 135–145)

## 2017-10-07 LAB — CBC
HCT: 22.5 % — ABNORMAL LOW (ref 36.0–46.0)
Hemoglobin: 7.2 g/dL — ABNORMAL LOW (ref 12.0–15.0)
MCH: 30.6 pg (ref 26.0–34.0)
MCHC: 32 g/dL (ref 30.0–36.0)
MCV: 95.7 fL (ref 78.0–100.0)
Platelets: 339 10*3/uL (ref 150–400)
RBC: 2.35 MIL/uL — AB (ref 3.87–5.11)
RDW: 18.9 % — ABNORMAL HIGH (ref 11.5–15.5)
WBC: 9.4 10*3/uL (ref 4.0–10.5)

## 2017-10-07 LAB — PLATELET INHIBITION P2Y12: PLATELET FUNCTION P2Y12: 10 [PRU] — AB (ref 194–418)

## 2017-10-07 NOTE — Progress Notes (Signed)
[facility] Progress Note  [name] 77 y.o. 1941-06-09   Subjective: Feels ok. No melena today or last night and no BMs. Son at bedside.  Objective: Vital signs: Vitals:   10/07/17 0952 10/07/17 1222  BP:  139/71  Pulse:  72  Resp:  19  Temp:    SpO2: 96% 97%  T 98  Physical Exam: Gen: lethargic, elderly, no acute distress, pleasant HEENT: anicteric sclera CV: RRR Chest: CTA B Abd: soft, nontender, nondistended, +BS  Lab Results: Recent Labs    10/06/17 0316 10/07/17 0740  NA 138 136  K 4.2 4.1  CL 111 109  CO2 22 23  GLUCOSE 95 98  BUN 39* 22*  CREATININE 1.30* 1.14*  CALCIUM 8.5* 8.8*   No results for input(s): AST, ALT, ALKPHOS, BILITOT, PROT, ALBUMIN in the last 72 hours. 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 See capsule report for complete details (on paper chart). Patient on Plavix due to coronary stent. If Hgb continues to fall then may need to have a double balloon enteroscopy (DBE) at a tertiary care center since area of bleeding seen on capsule endoscopy is likely distal to reach of push enteroscope. Follow H/Hs. Long discussion with patient and her son about the capsule endoscopy findings and recommendations. Plavix increasing risk of bleeding but doubt it is causing this bleeding. Deferred son's question of whether there is another anticoagulant that will cause less risk of bleeding than Plavix. Supportive care. Will follow.   [place] C. 10/07/2017, 6:06 PM  Pager [phone]  AFTER 5 PM or on weekends please call [phone] ID: [name], female   DOB: July 09, 1941, 77 y.o.   MRN: [id number]

## 2017-10-07 NOTE — Progress Notes (Signed)
Chart reviewed - will plan to repeat P2Y12 assay tomorrow (Sunday) and review. On plavix monotherapy. Will be available as needed over the weekend.  Pixie Casino, MD, [facility], [place] Director of the Advanced Lipid Disorders &  Cardiovascular Risk Reduction Clinic Diplomate of the American Board of Clinical Lipidology Attending Cardiologist  Direct Dial: [phone]  Fax: [phone]  Website:  [email or web address]

## 2017-10-07 NOTE — Progress Notes (Signed)
CARDIAC REHAB PHASE I   PRE:  Rate/Rhythm: 74 SR  BP:  Supine:   Sitting: 139/71   Standing:    SaO2: 99 RA  MODE:  Ambulation: 40 ft   POST:  Rate/Rhythm: 76 SR  BP:  Supine:   Sitting: 131/59  Standing:    SaO2: 97 RA 1130-1200 Assisted X 2 and used rollator to ambulate. Pt wanted to just walk in the room. She was feeling anxious about trying to walk in the hall. Gait steady with rollator. VS stable. Pt was able to walk 40 feet inside the room.She did c/o of feeling SOB by the end of the walk.Pt to recliner after walk with call light in reach. We will continue to follow pt.  Rodney Langton RN 10/07/2017 12:09 PM

## 2017-10-07 NOTE — Plan of Care (Signed)
  Problem: Education: Goal: Knowledge of General Education information will improve Outcome: Progressing   Problem: Health Behavior/Discharge Planning: Goal: Ability to manage health-related needs will improve Outcome: Progressing   Problem: Clinical Measurements: Goal: Ability to maintain clinical measurements within normal limits will improve Outcome: Progressing   

## 2017-10-08 ENCOUNTER — Encounter (HOSPITAL_COMMUNITY): Payer: Self-pay | Admitting: Gastroenterology

## 2017-10-08 LAB — CBC
HCT: 24.1 % — ABNORMAL LOW (ref 36.0–46.0)
HEMOGLOBIN: 7.5 g/dL — AB (ref 12.0–15.0)
MCH: 30.1 pg (ref 26.0–34.0)
MCHC: 31.1 g/dL (ref 30.0–36.0)
MCV: 96.8 fL (ref 78.0–100.0)
PLATELETS: 398 10*3/uL (ref 150–400)
RBC: 2.49 MIL/uL — AB (ref 3.87–5.11)
RDW: 18.8 % — ABNORMAL HIGH (ref 11.5–15.5)
WBC: 10.1 10*3/uL (ref 4.0–10.5)

## 2017-10-08 LAB — BASIC METABOLIC PANEL
ANION GAP: 7 (ref 5–15)
BUN: 14 mg/dL (ref 6–20)
CHLORIDE: 109 mmol/L (ref 101–111)
CO2: 23 mmol/L (ref 22–32)
Calcium: 9.1 mg/dL (ref 8.9–10.3)
Creatinine, Ser: 1 mg/dL (ref 0.44–1.00)
GFR calc Af Amer: >60 mL/min (ref >60)
GFR calc non Af Amer: 53 mL/min — ABNORMAL LOW (ref >60)
Glucose, Bld: 104 mg/dL — ABNORMAL HIGH (ref 65–99)
POTASSIUM: 4 mmol/L (ref 3.5–5.1)
SODIUM: 139 mmol/L (ref 135–145)

## 2017-10-08 LAB — PLATELET INHIBITION P2Y12: Platelet Function  P2Y12: 9 [PRU] — ABNORMAL LOW (ref 194–418)

## 2017-10-08 NOTE — Progress Notes (Signed)
Patient up to chair x1 today will monitor patient. Nikitia Asbill, Bettina Gavia RN

## 2017-10-08 NOTE — Progress Notes (Signed)
[facility] Progress Note  [name] 77 y.o. 1941/02/06   Subjective: No BMs or bleeding overnight. Did not sleep well reportedly because she is nervous about her condition. Nurse in room.  Objective: Vital signs: Vitals:   10/08/17 0914 10/08/17 1034  BP:    Pulse:  (!) 103  Resp:    Temp:    SpO2: 96%   T 97.8, BP 158/62  Physical Exam: Gen: lethargic, elderly, thin, no acute distress  HEENT: anicteric sclera CV: RRR Chest: CTA B Abd: RLQ tenderness with guarding, soft, nondistended, +BS Ext: no edema  Lab Results: Recent Labs    10/07/17 0740 10/08/17 0231  NA 136 139  K 4.1 4.0  CL 109 109  CO2 23 23  GLUCOSE 98 104*  BUN 22* 14  CREATININE 1.14* 1.00  CALCIUM 8.8* 9.1   No results for input(s): AST, ALT, ALKPHOS, BILITOT, PROT, ALBUMIN in the last 72 hours. Recent Labs    10/07/17 0740 10/08/17 0231  WBC 9.4 10.1  HGB 7.2* 7.5*  HCT 22.5* 24.1*  MCV 95.7 96.8  PLT 339 398      Assessment/Plan: Obscure overt bleeding with capsule endoscopy positive for bleeding in mid-small bowel without a definite source seen. Hgb 7.5 (stable from yesterday). On Miralax and likely will have residual blood with black stools from red blood seen on capsule. Needs a double balloon enteroscopy if rebleeding continues. See yesterday's note for complete details. Follow H/Hs. Dr. Oletta Lamas to f/u tomorrow for [facility].   Jera[place] C. 10/08/2017, 11:28 AM  Pager [phone]  AFTER 5 PM or on weekends please call [phone] ID: [name], female   DOB: [birth year], 77 y.o.   MRN: [id number]

## 2017-10-08 NOTE — Progress Notes (Signed)
P2Y12 is noted to be 9 today - suggests sufficient platelet inhibition on clopidogrel. Continue current therapy.  Pixie Casino, MD, [facility], [place] Director of the Advanced Lipid Disorders &  Cardiovascular Risk Reduction Clinic Diplomate of the American Board of Clinical Lipidology Attending Cardiologist  Direct Dial: [phone]  Fax: [phone]  Website:  [email or web address]

## 2017-10-09 DIAGNOSIS — I25110 Atherosclerotic heart disease of native coronary artery with unstable angina pectoris: Secondary | ICD-10-CM

## 2017-10-09 LAB — CBC
HEMATOCRIT: 21 % — AB (ref 36.0–46.0)
Hemoglobin: 6.4 g/dL — CL (ref 12.0–15.0)
MCH: 30.5 pg (ref 26.0–34.0)
MCHC: 30.5 g/dL (ref 30.0–36.0)
MCV: 100 fL (ref 78.0–100.0)
PLATELETS: 438 10*3/uL — AB (ref 150–400)
RBC: 2.1 MIL/uL — AB (ref 3.87–5.11)
RDW: 20 % — AB (ref 11.5–15.5)
WBC: 13.9 10*3/uL — AB (ref 4.0–10.5)

## 2017-10-09 LAB — PREPARE RBC (CROSSMATCH)

## 2017-10-09 MED ORDER — SODIUM CHLORIDE 0.9 % IV SOLN
Freq: Once | INTRAVENOUS | Status: AC
  Administered 2017-10-09: 15:00:00 via INTRAVENOUS

## 2017-10-09 NOTE — Progress Notes (Signed)
PT Cancellation Note  Patient Details Name: [name] MRN: [id number] DOB: 02-10-1941   Cancelled Treatment:    Reason Eval/Treat Not Completed: Medical issues which prohibited therapy(Pt currently with Hgb 6.4, awaiting transfusion of 2unit PRBCs.  Will defer tx until tomorrow.  )   [name] 10/09/2017, 2:28 PM  Governor Rook[place], PTA pager [phone]

## 2017-10-09 NOTE — Progress Notes (Signed)
HGB 6.4, cardiology aware. Will transfuse 2 units PRBCs

## 2017-10-09 NOTE — Plan of Care (Signed)
  Problem: Education: Goal: Knowledge of General Education information will improve Outcome: Progressing   Problem: Health Behavior/Discharge Planning: Goal: Ability to manage health-related needs will improve Outcome: Progressing   Problem: Clinical Measurements: Goal: Ability to maintain clinical measurements within normal limits will improve Outcome: Progressing   Problem: Activity: Goal: Risk for activity intolerance will decrease Outcome: Progressing   Problem: Coping: Goal: Level of anxiety will decrease Outcome: Progressing   

## 2017-10-09 NOTE — Progress Notes (Signed)
[facility] PROGRESS NOTE Subjective Patient without any other gross bleeding.  At this point she had colonoscopy 1 year ago showing ulcers in the colon possibly ischemic.  2 EGDs on this admission showed gastric ulcers.  The first with blood in the stoma the second no active bleeding.  Small bowel capsule was done over the weekend and read by Dr. Michail Sermon showed probable AVM with some bleeding in the small bowel.  Patient reports today that she feels a little nauseated but has not had a bowel movement.  Objective: Vital signs in last 24 hours: Temp:  [97.5 F (36.4 C)-98 F (36.7 C)] 97.9 F (36.6 C) (04/01 0807) Pulse Rate:  [76-103] 89 (04/01 0807) Resp:  [16-25] 19 (04/01 0530) BP: (101-141)/(42-97) 115/88 (04/01 0807) SpO2:  [94 %-98 %] 96 % (04/01 0934) Last BM Date: 10/08/17  Intake/Output from previous day: 03/31 0701 - 04/01 0700 In: 240 [P.O.:240] Out: 1150 [Urine:1150] Intake/Output this shift: No intake/output data recorded.  PE: General--alert and oriented Heart--grossly normal Lungs--clear Abdomen--good bowel sounds and nontender to exam  Lab Results: Recent Labs    10/07/17 0740 10/08/17 0231  WBC 9.4 10.1  HGB 7.2* 7.5*  HCT 22.5* 24.1*  PLT 339 398   BMET Recent Labs    10/07/17 0740 10/08/17 0231  NA 136 139  K 4.1 4.0  CL 109 109  CO2 23 23  CREATININE 1.14* 1.00   LFT No results for input(s): PROT, AST, ALT, ALKPHOS, BILITOT, BILIDIR, IBILI in the last 72 hours. PT/INR No results for input(s): LABPROT, INR in the last 72 hours. PANCREAS No results for input(s): LIPASE in the last 72 hours.       Studies/Results: No results found.  Medications: I have reviewed the patient's current medications.  Assessment:   1.  Chronic GI bleeding.  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   If these are to be treated she would likely require double balloon enteroscopy that would need to be done at 1 of the medical centers. 2.  Recent cardiac arrest with ventricular fibrillation and CPR.  This occurred after stenting of the right coronary artery and required further stenting.  Patient will need to be on chronic antiplatelet agents. 3.  Recurrent non-small cell lung cancer.  Stage I a adenocarcinoma diagnosed originally January 2010.  Underwent right upper lobe lobectomy with lymph node dissection with subsequent radiotherapy and chemotherapy followed by Dr. Earlie Server.  In 2016 she had recurrence in the left upper lobe of adenocarcinoma and underwent thermal ablation by IR  Plan: The patient is very frail and fortunately seems to have stopped bleeding for now.  She is on Plavix alone.  Due to her significant cardiac disease she will need to be on this for some time.  I searched her chart and I cannot find an iron panel.  She does see oncology on a fairly regular basis and one option would be to give her IV iron on a regular basis which could help her maintain stable hemoglobin assuming her bleeding is controlled.  We will go ahead and check an iron panel in AM and consider giving some IV iron here in the hospital if the iron is low.   Nancy Fetter 10/09/2017, 9:40 AM  This note was created using voice recognition software. Minor errors may Have occurred unintentionally.  Pager:  [phone] If no answer or after hours call [phone]

## 2017-10-09 NOTE — Progress Notes (Signed)
11:17 pt called d/t arm pain. Pt IV infiltrated during blood transfusion. Blood stopped. IV removed, and arm elevated.   Iv team got a left wrist IV in place. Blood tubing thrown away and changed to new tubing. Restarted blood back at 11:35pm. WIll continue to monitor pt.

## 2017-10-09 NOTE — Progress Notes (Addendum)
Progress Note  Patient Name: [name] Date of Encounter: 10/09/2017  Primary Cardiologist: Ena Dawley, MD   Subjective   Chest wall is sore. No dyspnea this am. No further GI bleeding noted.   Inpatient Medications    Scheduled Meds: . acetaminophen  1,000 mg Oral BID  . arformoterol  15 mcg Nebulization BID  . atorvastatin  80 mg Oral q1800  . budesonide (PULMICORT) nebulizer solution  0.5 mg Nebulization BID  . calcium-vitamin D  2 tablet Oral Daily  . clopidogrel  75 mg Oral Q breakfast  . ferrous sulfate  325 mg Oral Q breakfast  . gabapentin  600 mg Oral QHS  . hydrALAZINE  10 mg Intravenous Once  . levothyroxine  100 mcg Oral QAC breakfast  . lidocaine  1 patch Transdermal Q24H  . mouth rinse  15 mL Mouth Rinse BID  . metoprolol tartrate  100 mg Oral BID  . multivitamin with minerals  1 tablet Oral Daily  . pantoprazole  40 mg Oral BID  . polyethylene glycol  17 g Oral Daily   Continuous Infusions:  PRN Meds: albuterol, alum & mag hydroxide-simeth, bisacodyl, calcium carbonate, hydrALAZINE, HYDROcodone-acetaminophen, nitroGLYCERIN, ondansetron (ZOFRAN) IV, zolpidem   Vital Signs    Vitals:   10/09/17 0040 10/09/17 0530 10/09/17 0807 10/09/17 0934  BP: (!) 136/57 (!) 101/42 115/88   Pulse: 76 79 89   Resp: 16 19    Temp: 97.7 F (36.5 C) 98 F (36.7 C) 97.9 F (36.6 C)   TempSrc: Oral Oral Oral   SpO2: 94%  98% 96%  Weight:      Height:        Intake/Output Summary (Last 24 hours) at 10/09/2017 1000 Last data filed at 10/09/2017 0532 Gross per 24 hour  Intake 0 ml  Output 1150 ml  Net -1150 ml   Filed Weights   09/26/17 2104 09/30/17 1035 10/06/17 2351  Weight: 137 lb 2 oz (62.2 kg) 137 lb (62.1 kg) 97 lb 10.6 oz (44.3 kg)    Telemetry    Sinus - Personally Reviewed  ECG    No am ekg - Personally Reviewed  Physical Exam   GEN: No acute distress.   Neck: No JVD Cardiac: RRR, no murmurs, rubs, or gallops.  Respiratory: Clear to  auscultation bilaterally. GI: Soft, nontender, non-distended  MS: No edema; No deformity. Neuro:  Nonfocal  Psych: Normal affect   Labs    Chemistry Recent Labs  Lab 10/06/17 0316 10/07/17 0740 10/08/17 0231  NA 138 136 139  K 4.2 4.1 4.0  CL 111 109 109  CO2 22 23 23   GLUCOSE 95 98 104*  BUN 39* 22* 14  CREATININE 1.30* 1.14* 1.00  CALCIUM 8.5* 8.8* 9.1  GFRNONAA 39* 46* 53*  GFRAA 45* 53* >60  ANIONGAP 5 4* 7     Hematology Recent Labs  Lab 10/06/17 0316 10/07/17 0740 10/08/17 0231  WBC 13.1* 9.4 10.1  RBC 2.61* 2.35* 2.49*  HGB 7.7* 7.2* 7.5*  HCT 23.9* 22.5* 24.1*  MCV 91.6 95.7 96.8  MCH 29.5 30.6 30.1  MCHC 32.2 32.0 31.1  RDW 17.6* 18.9* 18.8*  PLT 338 339 398    Cardiac EnzymesNo results for input(s): TROPONINI in the last 168 hours. No results for input(s): TROPIPOC in the last 168 hours.   BNP Recent Labs  Lab 10/04/17 0444  BNP 115.8*     DDimer No results for input(s): DDIMER in the last 168 hours.  Radiology    No results found.  Cardiac Studies     Patient Profile      77 y.o. female with abnormal coronary CT, CKD stage III, mild carotid stenosis, hypothyroidism, GERD, NSCSL status post right upper lobectomy/XRT who initially presented for elective cardiac catheterization on 3/14 and had a DES placed in the RCA. There wasan apparent dissection versus thrombosis following the initial cath that resulted in repeat cath and intubation with associated V fib arrest. A second stent was placed overlapping the distal of the previous stent in the RCA with grade 3 TIMI flow.  Subsequent hospital course has been complicated by acute GI bleeding with melena, anemia requiring multiple transfusions, and identification of nonbleeding gastric ulcers.   Assessment & Plan    1. CAD: Stable this am. Recent placement DES in the RCA complicated by stent thrombosis. Another stent was placed in the RCA and the stent was further expanded. She is now on  Plavix. She is having no chest pain. In setting of GI bleeding, plan to continue Plavix alone with no ASA. Continue statin and beta blocker.   2. GI bleeding: GI is following. Her H/H is 6.4/21 this am. Will transfuse 2 units pRBCS. Iron studies pending.  3. HTN: BP is controlled  4. Chest pain s/p sternal fracture  For questions or updates, please contact [place] Please consult [email or web address] for contact info under Cardiology/STEMI.        Signed, [name], MD  10/09/2017, 10:00 AM

## 2017-10-10 LAB — BASIC METABOLIC PANEL
ANION GAP: 9 (ref 5–15)
BUN: 21 mg/dL — ABNORMAL HIGH (ref 6–20)
CHLORIDE: 109 mmol/L (ref 101–111)
CO2: 20 mmol/L — ABNORMAL LOW (ref 22–32)
CREATININE: 0.84 mg/dL (ref 0.44–1.00)
Calcium: 8.8 mg/dL — ABNORMAL LOW (ref 8.9–10.3)
GFR calc non Af Amer: >60 mL/min (ref >60)
Glucose, Bld: 98 mg/dL (ref 65–99)
POTASSIUM: 3.9 mmol/L (ref 3.5–5.1)
Sodium: 138 mmol/L (ref 135–145)

## 2017-10-10 LAB — CBC
HEMATOCRIT: 29.7 % — AB (ref 36.0–46.0)
HEMOGLOBIN: 9.4 g/dL — AB (ref 12.0–15.0)
MCH: 28.4 pg (ref 26.0–34.0)
MCHC: 31.6 g/dL (ref 30.0–36.0)
MCV: 89.7 fL (ref 78.0–100.0)
Platelets: 320 10*3/uL (ref 150–400)
RBC: 3.31 MIL/uL — AB (ref 3.87–5.11)
RDW: 23.7 % — ABNORMAL HIGH (ref 11.5–15.5)
WBC: 8.8 10*3/uL (ref 4.0–10.5)

## 2017-10-10 LAB — FERRITIN: Ferritin: 44 ng/mL (ref 11–307)

## 2017-10-10 LAB — IRON AND TIBC
Iron: 216 ug/dL — ABNORMAL HIGH (ref 28–170)
SATURATION RATIOS: 75 % — AB (ref 10.4–31.8)
TIBC: 290 ug/dL (ref 250–450)
UIBC: 74 ug/dL

## 2017-10-10 NOTE — Progress Notes (Signed)
Progress Note  Patient Name: [name] Date of Encounter: 10/10/2017  Primary Cardiologist: Ena Dawley, MD   Subjective   No chest pain or dyspnea. Some nausea this am  Inpatient Medications    Scheduled Meds: . acetaminophen  1,000 mg Oral BID  . arformoterol  15 mcg Nebulization BID  . atorvastatin  80 mg Oral q1800  . budesonide (PULMICORT) nebulizer solution  0.5 mg Nebulization BID  . calcium-vitamin D  2 tablet Oral Daily  . clopidogrel  75 mg Oral Q breakfast  . ferrous sulfate  325 mg Oral Q breakfast  . gabapentin  600 mg Oral QHS  . hydrALAZINE  10 mg Intravenous Once  . levothyroxine  100 mcg Oral QAC breakfast  . lidocaine  1 patch Transdermal Q24H  . mouth rinse  15 mL Mouth Rinse BID  . metoprolol tartrate  100 mg Oral BID  . multivitamin with minerals  1 tablet Oral Daily  . pantoprazole  40 mg Oral BID  . polyethylene glycol  17 g Oral Daily   Continuous Infusions:  PRN Meds: albuterol, alum & mag hydroxide-simeth, bisacodyl, calcium carbonate, hydrALAZINE, HYDROcodone-acetaminophen, nitroGLYCERIN, ondansetron (ZOFRAN) IV, zolpidem   Vital Signs    Vitals:   10/10/17 0505 10/10/17 0506 10/10/17 0812 10/10/17 0912  BP: (!) 121/49  (!) 141/58   Pulse:      Resp: 16  18   Temp:   98 F (36.7 C)   TempSrc:   Oral   SpO2: 97%  96% 98%  Weight:  97 lb 7.1 oz (44.2 kg)    Height:        Intake/Output Summary (Last 24 hours) at 10/10/2017 1259 Last data filed at 10/10/2017 0300 Gross per 24 hour  Intake 1507.5 ml  Output -  Net 1507.5 ml   Filed Weights   09/30/17 1035 10/06/17 2351 10/10/17 0506  Weight: 137 lb (62.1 kg) 97 lb 10.6 oz (44.3 kg) 97 lb 7.1 oz (44.2 kg)    Telemetry    NSR - Personally Reviewed  ECG    No am ekg - Personally Reviewed  Physical Exam   General: Well developed, well nourished, NAD  HEENT: OP clear, mucus membranes moist  SKIN: warm, dry. No rashes. Neuro: No focal deficits  Musculoskeletal:  Muscle strength 5/5 all ext  Psychiatric: Mood and affect normal  Neck: No JVD, no carotid bruits, no thyromegaly, no lymphadenopathy.  Lungs:Clear bilaterally, no wheezes, rhonci, crackles Cardiovascular: Regular rate and rhythm. No murmurs, gallops or rubs. Abdomen:Soft. Bowel sounds present. Non-tender.  Extremities: No lower extremity edema. Pulses are 2 + in the bilateral DP/PT.   Labs    Chemistry Recent Labs  Lab 10/07/17 0740 10/08/17 0231 10/10/17 0754  NA 136 139 138  K 4.1 4.0 3.9  CL 109 109 109  CO2 23 23 20*  GLUCOSE 98 104* 98  BUN 22* 14 21*  CREATININE 1.14* 1.00 0.84  CALCIUM 8.8* 9.1 8.8*  GFRNONAA 46* 53* >60  GFRAA 53* >60 >60  ANIONGAP 4* 7 9     Hematology Recent Labs  Lab 10/08/17 0231 10/09/17 0822 10/10/17 0754  WBC 10.1 13.9* 8.8  RBC 2.49* 2.10* 3.31*  HGB 7.5* 6.4* 9.4*  HCT 24.1* 21.0* 29.7*  MCV 96.8 100.0 89.7  MCH 30.1 30.5 28.4  MCHC 31.1 30.5 31.6  RDW 18.8* 20.0* 23.7*  PLT 398 438* 320    Cardiac EnzymesNo results for input(s): TROPONINI in the last 168 hours. No  results for input(s): TROPIPOC in the last 168 hours.   BNP Recent Labs  Lab 10/04/17 0444  BNP 115.8*     DDimer No results for input(s): DDIMER in the last 168 hours.   Radiology    No results found.  Cardiac Studies     Patient Profile      77 y.o. female with abnormal coronary CT, CKD stage III, mild carotid stenosis, hypothyroidism, GERD, NSCSL status post right upper lobectomy/XRT who initially presented for elective cardiac catheterization on 3/14 and had a DES placed in the RCA. There wasan apparent dissection versus thrombosis following the initial cath that resulted in repeat cath and intubation with associated V fib arrest. A second stent was placed overlapping the distal of the previous stent in the RCA with grade 3 TIMI flow.  Subsequent hospital course has been complicated by acute GI bleeding with melena, anemia requiring multiple  transfusions, and identification of nonbleeding gastric ulcers.   Assessment & Plan    1. CAD: Recent DES placement in the RCA complicated by stent thrombosis and treated with another stent. She has been on Plavix. She is having no chest pain. Given GI bleeding, we have planned on continuing Plavix alone with no ASA. Will also continue statin and beta blocker.    2. GI bleeding: Question source of bleeding-gastric ulcers vs AVMs vs colon ulcers. She is being followed by GI. She is s/p transfusion of 2 units pRBCs yesterday. H/H stable this am. Per discussion with Dr. Tamala Julian and Dr. Oletta Lamas (GI), will continue with conservative plan but if she has more bleeding, may have to consider transfer to [facility] for double balloon enteroscopy.   3. HTN: BP is controlled.   For questions or updates, please contact [place] Please consult [email or web address] for contact info under Cardiology/STEMI.        Signed, [name], MD  10/10/2017, 12:59 PM

## 2017-10-10 NOTE — Progress Notes (Signed)
[facility] PROGRESS NOTE Subjective Patient is doing fairly well.  She had 2 units of blood last night after dropping to 6.4.  Her stools are dark but not maroon or bright red.  Objective: Vital signs in last 24 hours: Temp:  [97.5 F (36.4 C)-98.6 F (37 C)] 98 F (36.7 C) (04/02 0812) Pulse Rate:  [72-97] 78 (04/02 0120) Resp:  [16-19] 18 (04/02 0812) BP: (101-141)/(46-98) 141/58 (04/02 0812) SpO2:  [95 %-100 %] 98 % (04/02 0912) Weight:  [44.2 kg (97 lb 7.1 oz)] 44.2 kg (97 lb 7.1 oz) (04/02 0506) Last BM Date: 10/08/17  Intake/Output from previous day: 04/01 0701 - 04/02 0700 In: 1507.5 [P.O.:240; I.V.:200; Blood:1067.5] Out: -  Intake/Output this shift: No intake/output data recorded.  PE:  Lab Results: Recent Labs    10/08/17 0231 10/09/17 0822 10/10/17 0754  WBC 10.1 13.9* 8.8  HGB 7.5* 6.4* 9.4*  HCT 24.1* 21.0* 29.7*  PLT 398 438* 320   BMET Recent Labs    10/08/17 0231 10/10/17 0754  NA 139 138  K 4.0 3.9  CL 109 109  CO2 23 20*  CREATININE 1.00 0.84   LFT No results for input(s): PROT, AST, ALT, ALKPHOS, BILITOT, BILIDIR, IBILI in the last 72 hours. PT/INR No results for input(s): LABPROT, INR in the last 72 hours. PANCREAS No results for input(s): LIPASE in the last 72 hours.       Studies/Results: No results found.  Medications: I have reviewed the patient's current medications.  Assessment:   1.  Chronic GI bleeding.  Patient has had recent gastric ulcers with ulcers in the colon a year ago.  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 2.  Recent cardiac arrest with CPR and stent placement requiring antiplatelet agents.  Aspirin is now on hold.  Patient remains on Plavix.  On discussion with Dr. Tamala Julian about options.   Plan: Discussed in detail with Dr. Tamala Julian as well as patient and her son.  We felt that conservative  observation for now would be best and if she continues to bleed the choices would be transferred to [facility] probably [facility] for double balloon enteroscopy, GI bleeding scan with possible angiogram and embolization, possible repeat EGD and colonoscopy, or holding antiplatelet agents for a period of time to see if the bleeding would stop.  All these things were discussed but at this point we will watch and see how she does.   Nancy Fetter 10/10/2017, 11:44 AM  This note was created using voice recognition software. Minor errors may Have occurred unintentionally.  Pager: [phone] If no answer or after hours call [phone]

## 2017-10-10 NOTE — Progress Notes (Signed)
PT Cancellation Note  Patient Details Name: [name] MRN: [id number] DOB: 07-Oct-1940   Cancelled Treatment:    Reason Eval/Treat Not Completed: (P) Fatigue/lethargy limiting ability to participate(Pt feeling nauseated and refused OOB mobility and bed exercises.  Pt educated on the benefits of mobility and she remains to decline. )   Wilmetta Speiser J Kristine Chahal 10/10/2017, 4:23 PM Governor Rooks, PTA pager [phone]

## 2017-10-11 LAB — HEMOGLOBIN AND HEMATOCRIT, BLOOD
HCT: 23.6 % — ABNORMAL LOW (ref 36.0–46.0)
Hemoglobin: 7.5 g/dL — ABNORMAL LOW (ref 12.0–15.0)

## 2017-10-11 LAB — CBC
HCT: 23.2 % — ABNORMAL LOW (ref 36.0–46.0)
HEMOGLOBIN: 7.4 g/dL — AB (ref 12.0–15.0)
MCH: 28.7 pg (ref 26.0–34.0)
MCHC: 31.9 g/dL (ref 30.0–36.0)
MCV: 89.9 fL (ref 78.0–100.0)
PLATELETS: 299 10*3/uL (ref 150–400)
RBC: 2.58 MIL/uL — AB (ref 3.87–5.11)
RDW: 23.7 % — ABNORMAL HIGH (ref 11.5–15.5)
WBC: 7.6 10*3/uL (ref 4.0–10.5)

## 2017-10-11 LAB — PREPARE RBC (CROSSMATCH)

## 2017-10-11 MED ORDER — SODIUM CHLORIDE 0.9 % IV SOLN
Freq: Once | INTRAVENOUS | Status: AC
  Administered 2017-10-11: 12:00:00 via INTRAVENOUS

## 2017-10-11 MED ORDER — SODIUM CHLORIDE 0.9% FLUSH: 10.0000 mL | INTRAVENOUS | Status: DC | PRN

## 2017-10-11 MED ORDER — SODIUM CHLORIDE 0.9% FLUSH
10.0000 mL | Freq: Two times a day (BID) | INTRAVENOUS | Status: DC
  Administered 2017-10-11 – 2017-10-17 (×11): 10 mL

## 2017-10-11 MED ORDER — SODIUM CHLORIDE 0.9 % IV SOLN
Freq: Once | INTRAVENOUS | Status: AC
Start: 2017-10-11 — End: 2017-10-11
  Administered 2017-10-11: 18:00:00 via INTRAVENOUS

## 2017-10-11 NOTE — Progress Notes (Signed)
Blood picked up at 11:03, Pt's IV blew and blood stopped at 12:20 pm. IV team consulted for stat midline placement. Blood can be infused until 3:00 pm. Will continue to monitor.

## 2017-10-11 NOTE — Progress Notes (Signed)
[facility] PROGRESS NOTE Subjective Patient and her son have discussed all the issues that we talked about yesterday.  They have decided not to go to [facility] for double balloon enteroscopy and she does not want EGD or colonoscopy, bleeding scan or angiogram.  They have decided to go ahead and stop the Plavix and see if the bleeding stops.  She is aware that this would place her at risk for another heart attack.  She has had some nausea yesterday and had a transfusion but feels much better today.  Objective: Vital signs in last 24 hours: Temp:  [97.6 F (36.4 C)-98.7 F (37.1 C)] 97.6 F (36.4 C) (04/03 1120) Pulse Rate:  [72-84] 72 (04/03 1120) Resp:  [16-21] 20 (04/03 1120) BP: (106-157)/(46-93) 107/46 (04/03 1120) SpO2:  [95 %-100 %] 98 % (04/03 0900) Last BM Date: 10/10/17  Intake/Output from previous day: 04/02 0701 - 04/03 0700 In: 720 [P.O.:720] Out: 500 [Urine:500] Intake/Output this shift: No intake/output data recorded.    Lab Results: Recent Labs    10/09/17 0822 10/10/17 0754 10/11/17 0014  WBC 13.9* 8.8 7.6  HGB 6.4* 9.4* 7.4*  HCT 21.0* 29.7* 23.2*  PLT 438* 320 299   BMET Recent Labs    10/10/17 0754  NA 138  K 3.9  CL 109  CO2 20*  CREATININE 0.84   LFT No results for input(s): PROT, AST, ALT, ALKPHOS, BILITOT, BILIDIR, IBILI in the last 72 hours. PT/INR No results for input(s): LABPROT, INR in the last 72 hours. PANCREAS No results for input(s): LIPASE in the last 72 hours.       Studies/Results: No results found.  Medications: I have reviewed the patient's current medications.  Assessment: 1.  Lower GI bleeding.  They have decided to hold the Plavix and see how things go as above.   Plan:  Discussed with patient and her son the fact that if she continues to bleed with holding the Plavix that we still could go ahead with bleeding scan double balloon enteroscopy colonoscopy etc.  They understand this and are hopeful  that holding the Plavix will result in bleeding stop. We will follow.   Nancy Fetter 10/11/2017, 11:52 AM  This note was created using voice recognition software. Minor errors may Have occurred unintentionally.  Pager: [phone] If no answer or after hours call [phone]

## 2017-10-11 NOTE — Progress Notes (Signed)
Midline not completed in time to complete remaining blood. Will obtain H&H and notify MD. 230 mL wasted.

## 2017-10-11 NOTE — Progress Notes (Signed)
OT Cancellation Note  Patient Details Name: OLIVIANA MCGAHEE MRN: [id number] DOB: 1940-07-29   Cancelled Treatment:    Reason Eval/Treat Not Completed: Medical issues which prohibited therapy(low Hgb and difficulties with receiving blood transfusion)  Binnie Kand M.S., OTR/L Pager: [phone]  10/11/2017, 2:58 PM

## 2017-10-11 NOTE — Progress Notes (Addendum)
Progress Note  Patient Name: [name] Date of Encounter: 10/11/2017  Primary Cardiologist: Ena Dawley, MD  Subjective   No dark stools overnight, but weak with nausea all yesterday afternoon and evening. Sitting on the side of the bed this morning.   Inpatient Medications    Scheduled Meds: . acetaminophen  1,000 mg Oral BID  . arformoterol  15 mcg Nebulization BID  . atorvastatin  80 mg Oral q1800  . budesonide (PULMICORT) nebulizer solution  0.5 mg Nebulization BID  . calcium-vitamin D  2 tablet Oral Daily  . clopidogrel  75 mg Oral Q breakfast  . ferrous sulfate  325 mg Oral Q breakfast  . gabapentin  600 mg Oral QHS  . hydrALAZINE  10 mg Intravenous Once  . levothyroxine  100 mcg Oral QAC breakfast  . lidocaine  1 patch Transdermal Q24H  . mouth rinse  15 mL Mouth Rinse BID  . metoprolol tartrate  100 mg Oral BID  . multivitamin with minerals  1 tablet Oral Daily  . pantoprazole  40 mg Oral BID  . polyethylene glycol  17 g Oral Daily   Continuous Infusions: . sodium chloride     PRN Meds: albuterol, alum & mag hydroxide-simeth, bisacodyl, calcium carbonate, hydrALAZINE, HYDROcodone-acetaminophen, nitroGLYCERIN, ondansetron (ZOFRAN) IV, zolpidem   Vital Signs    Vitals:   10/10/17 2038 10/10/17 2355 10/11/17 0456 10/11/17 0702  BP:  106/84 (!) 109/93 (!) 157/85  Pulse:  74 84   Resp:  16 (!) 21 18  Temp:  97.7 F (36.5 C) 98.1 F (36.7 C)   TempSrc:  Oral Oral   SpO2: 100% 96% 95% 95%  Weight:      Height:        Intake/Output Summary (Last 24 hours) at 10/11/2017 0843 Last data filed at 10/10/2017 2201 Gross per 24 hour  Intake 720 ml  Output 500 ml  Net 220 ml   Filed Weights   09/30/17 1035 10/06/17 2351 10/10/17 0506  Weight: 137 lb (62.1 kg) 97 lb 10.6 oz (44.3 kg) 97 lb 7.1 oz (44.2 kg)    Telemetry    SR - Personally Reviewed  ECG    N/a - Personally Reviewed  Physical Exam   General: Thin, frail older W female appearing in  no acute distress. Head: Normocephalic, atraumatic.  Neck: Supple, no JVD. Lungs:  Resp regular and unlabored, CTA. Heart: RRR, S1, S2, systolic murmur; no rub. Abdomen: Soft, non-tender, non-distended with normoactive bowel sounds.  Extremities: No clubbing, cyanosis, edema. Distal pedal pulses are 2+ bilaterally. Neuro: Alert and oriented X 3. Moves all extremities spontaneously. Psych: Normal affect.  Labs    Chemistry Recent Labs  Lab 10/07/17 0740 10/08/17 0231 10/10/17 0754  NA 136 139 138  K 4.1 4.0 3.9  CL 109 109 109  CO2 23 23 20*  GLUCOSE 98 104* 98  BUN 22* 14 21*  CREATININE 1.14* 1.00 0.84  CALCIUM 8.8* 9.1 8.8*  GFRNONAA 46* 53* >60  GFRAA 53* >60 >60  ANIONGAP 4* 7 9     Hematology Recent Labs  Lab 10/09/17 0822 10/10/17 0754 10/11/17 0014  WBC 13.9* 8.8 7.6  RBC 2.10* 3.31* 2.58*  HGB 6.4* 9.4* 7.4*  HCT 21.0* 29.7* 23.2*  MCV 100.0 89.7 89.9  MCH 30.5 28.4 28.7  MCHC 30.5 31.6 31.9  RDW 20.0* 23.7* 23.7*  PLT 438* 320 299    Cardiac EnzymesNo results for input(s): TROPONINI in the last 168 hours. No  results for input(s): TROPIPOC in the last 168 hours.   BNPNo results for input(s): BNP, PROBNP in the last 168 hours.   DDimer No results for input(s): DDIMER in the last 168 hours.    Radiology    No results found.  Cardiac Studies    Patient Profile     77 y.o. female with abnormal coronary CT, CKD stage III, mild carotid stenosis, hypothyroidism, GERD, NSCSL status post right upper lobectomy/XRT who initially presented for elective cardiac catheterization on 3/14 and had a DES placed in the RCA. There wasan apparent dissection versus thrombosis following the initial cath that resulted in repeat cath and intubation with associatedVfib arrest. A second stent was placed overlapping the distal of the previous stent in the RCA with grade 3 TIMI flow.Subsequent hospital course has been complicated by acute GI bleeding with melena,  anemia requiring multiple transfusions, and identification of nonbleeding gastric ulcers.  Assessment & Plan    1. CAD: Recent DES placement in the RCA complicated by stent thrombosis and treated with another stent. She has been on Plavix. She is having no chest pain. Given GI bleeding, we have planned on continuing Plavix alone with no ASA. Will also continue statin and beta blocker.    2. GI bleeding: Question source of bleeding-gastric ulcers vs AVMs vs colon ulcers. She is being followed by GI. She is s/p transfusion of 2 units pRBCs 10/09/17. H/H stable yesterday but dropped again to 7.4 today. Per discussion with Dr. Tamala Julian and Dr. Oletta Lamas (GI), will continue with conservative plan but if she has more bleeding, may have to consider transfer to [facility] for double balloon enteroscopy. Will need to follow up with GI for plans moving forward today. Of note Iron panel stable.  -- transfuse 1 unit now.  3. HTN: BP is controlled.    Signed, Reino Bellis, NP  10/11/2017, 8:43 AM  Pager # [phone]   For questions or updates, please contact [place] Please consult [email or web address] for contact info under Cardiology/STEMI.  I have personally seen and examined this patient. I agree with the assessment and plan as outlined above. Hgb back to down to 7.4 today.  Ms. Miggins has decided that she will no longer take Plavix. She is very reasonable and understands that her GI bleeding has not stopped while on Plavix. She has no desire to go to [facility] for a GI procedure. I have reviewed the risk of stent thrombosis. She understands this risk. She is willing to accept this risk. She has refused her am dose of Plavix. Her Plavix will be in her system for the next 5 days.  We will continue to follow GI recs. Hopefully her GI bleeding will stop off of Plavix. As above, this is her informed decision to stop Plavix.  Repeat H/H in am.   [name] 10/11/2017 11:00 AM

## 2017-10-11 NOTE — Progress Notes (Signed)
PT Cancellation Note  Patient Details Name: [name] MRN: [id number] DOB: 1940/10/04   Cancelled Treatment:    Reason Eval/Treat Not Completed: (P) Medical issues which prohibited therapy(Pt remains to drop hgb and to recieve another blood tranfusion at this time.  Pt refusing mobility at this time.  )   [name] 10/11/2017, 11:33 AM  Governor Rooks, PTA pager [phone]

## 2017-10-11 NOTE — Care Management Important Message (Signed)
Important Message  Patient Details  Name: [name] MRN: [id number] Date of Birth: July 09, 1941   Medicare Important Message Given:  Yes    Budd Freiermuth P Ladislaus Repsher 10/11/2017, 1:57 PM

## 2017-10-11 NOTE — Progress Notes (Signed)
Both arms assessed with ultrasound for PIV.  No suitable veins found.  Large amount of bruising/ hematomas noted.  Recommend consider midline or PICC.  IVRN

## 2017-10-12 LAB — BPAM RBC
BLOOD PRODUCT EXPIRATION DATE: 201904162359
BLOOD PRODUCT EXPIRATION DATE: 201905052359
BLOOD PRODUCT EXPIRATION DATE: 201905052359
Blood Product Expiration Date: 201904082359
ISSUE DATE / TIME: 201904012225
ISSUE DATE / TIME: 201904031759
ISSUE DATE / TIME: 201904011449
ISSUE DATE / TIME: 201904031103
UNIT TYPE AND RH: 1700
Unit Type and Rh: 1700
Unit Type and Rh: 1700
Unit Type and Rh: 1700

## 2017-10-12 LAB — CBC
HCT: 26.9 % — ABNORMAL LOW (ref 36.0–46.0)
HEMOGLOBIN: 8.5 g/dL — AB (ref 12.0–15.0)
MCH: 28.4 pg (ref 26.0–34.0)
MCHC: 31.6 g/dL (ref 30.0–36.0)
MCV: 90 fL (ref 78.0–100.0)
Platelets: 297 10*3/uL (ref 150–400)
RBC: 2.99 MIL/uL — AB (ref 3.87–5.11)
RDW: 21.9 % — ABNORMAL HIGH (ref 11.5–15.5)
WBC: 7.6 10*3/uL (ref 4.0–10.5)

## 2017-10-12 LAB — BASIC METABOLIC PANEL
Anion gap: 8 (ref 5–15)
BUN: 21 mg/dL — AB (ref 6–20)
CALCIUM: 8.5 mg/dL — AB (ref 8.9–10.3)
CO2: 20 mmol/L — AB (ref 22–32)
Chloride: 110 mmol/L (ref 101–111)
Creatinine, Ser: 1.03 mg/dL — ABNORMAL HIGH (ref 0.44–1.00)
GFR calc Af Amer: 60 mL/min — ABNORMAL LOW (ref >60)
GFR, EST NON AFRICAN AMERICAN: 51 mL/min — AB (ref >60)
Glucose, Bld: 84 mg/dL (ref 65–99)
Potassium: 3.6 mmol/L (ref 3.5–5.1)
Sodium: 138 mmol/L (ref 135–145)

## 2017-10-12 LAB — TYPE AND SCREEN
ABO/RH(D): B NEG
Antibody Screen: NEGATIVE
UNIT DIVISION: 0
UNIT DIVISION: 0
UNIT DIVISION: 0
Unit division: 0

## 2017-10-12 NOTE — Progress Notes (Signed)
[facility] PROGRESS NOTE Subjective No stool or bleeding overnight.  Objective: Vital signs in last 24 hours: Temp:  [97.5 F (36.4 C)-98.3 F (36.8 C)] 98.1 F (36.7 C) (04/04 0010) Pulse Rate:  [72-91] 78 (04/04 0736) Resp:  [15-20] 15 (04/04 0736) BP: (107-135)/(37-55) 125/37 (04/04 0010) SpO2:  [95 %-100 %] 98 % (04/04 0739) Last BM Date: 10/10/17  Intake/Output from previous day: 04/03 0701 - 04/04 0700 In: 434.2 [P.O.:50; I.V.:78.2; Blood:306] Out: -  Intake/Output this shift: No intake/output data recorded.    Lab Results: Recent Labs    10/10/17 0754 10/11/17 0014 10/11/17 1549 10/12/17 0252  WBC 8.8 7.6  --  7.6  HGB 9.4* 7.4* 7.5* 8.5*  HCT 29.7* 23.2* 23.6* 26.9*  PLT 320 299  --  297   BMET Recent Labs    10/10/17 0754 10/12/17 0252  NA 138 138  K 3.9 3.6  CL 109 110  CO2 20* 20*  CREATININE 0.84 1.03*   LFT No results for input(s): PROT, AST, ALT, ALKPHOS, BILITOT, BILIDIR, IBILI in the last 72 hours. PT/INR No results for input(s): LABPROT, INR in the last 72 hours. PANCREAS No results for input(s): LIPASE in the last 72 hours.       Studies/Results: No results found.  Medications: I have reviewed the patient's current medications.  Assessment:   1. LGI Bleed. ? SB origin. Off plavix x 2 days now. Doesn't want to go to [facility]   Plan: Will continue to observe off of plavix. She may consider enteroscopy here if bleeding continues but no transfer to [facility].    Nancy Fetter 10/12/2017, 8:56 AM  This note was created using voice recognition software. Minor errors may Have occurred unintentionally.  Pager: [phone] If no answer or after hours call [phone]

## 2017-10-12 NOTE — Progress Notes (Signed)
Physical Therapy Treatment Patient Details Name: [name] MRN: [id number] DOB: 01/02/1941 Today's Date: 10/12/2017    History of Present Illness Pt is a 77 y.o. female admitted 09/21/17 for elective cardiac cath. There wasan apparent dissection versus thrombosis following the initial cath that resulted in repeat cath 3/15 and intubation with associated A-fib arrest requiring CPR; a second stent was placed. Pt with acute GI bleed, s/p EGD with propofol 3/21. Additional EGD 3/23; pt with recurrent upper GI bleed with melena. PMH includes CKD III, HTN, COPD, non-small cell lung cancer (radiation 2013, 2015), arthritis.    PT Comments    Pt performed increased activity during session after multiple days of inactivity due to drops in HGB.  Pt stable at 8.4 during session this pm.  Pt remains fatigued and guarded with mobility.  Plan next session for gait training and functional mobility to tolerance.  Continued to recommend short term SNF due to decreased strength and function before returning home.    Follow Up Recommendations  SNF;Supervision for mobility/OOB     Equipment Recommendations  Rolling walker with 5" wheels    Recommendations for Other Services       Precautions / Restrictions Precautions Precautions: Fall Precaution Comments: Chest soreness/brusing post-CPR Restrictions Weight Bearing Restrictions: No    Mobility  Bed Mobility Overal bed mobility: Modified Independent Bed Mobility: Supine to Sit;Sit to Supine     Supine to sit: Modified independent (Device/Increase time) Sit to supine: Modified independent (Device/Increase time)   General bed mobility comments: Pt performed without assistance and in a quicker manner.  Pt with reduced pain in chest.    Transfers Overall transfer level: Needs assistance Equipment used: Rolling walker (2 wheeled) Transfers: Sit to/from Stand   Stand pivot transfers: Min guard       General transfer comment: Cues for hand  placement to and from seated surface.  Pt presents with poor eccentric loading when returning to seated surface due to fatigue.    Ambulation/Gait Ambulation/Gait assistance: Min guard Ambulation Distance (Feet): 140 Feet(x2 trials.  ) Assistive device: Rolling walker (2 wheeled) Gait Pattern/deviations: Step-through pattern;Shuffle;Narrow base of support Gait velocity: Decreased Gait velocity interpretation: Below normal speed for age/gender General Gait Details: Pt remains slow and guarded but motivated to improve.  Pt remains to require cues for pacing and posture.  Cues to relax shoulders and rely more on strength of her legs.  Close chair follow for safety.  Pt presents with increased WOB however SPO2 on RA 95%.     Stairs            Wheelchair Mobility    Modified Rankin (Stroke Patients Only)       Balance Overall balance assessment: Needs assistance   Sitting balance-Leahy Scale: Good       Standing balance-Leahy Scale: Fair Standing balance comment: Reliant on UE support and external assist from therapist                             Cognition Arousal/Alertness: Awake/alert Behavior During Therapy: WFL for tasks assessed/performed Overall Cognitive Status: Within Functional Limits for tasks assessed                                        Exercises      General Comments        Pertinent Vitals/Pain Pain  Assessment: 0-10 Pain Score: 3  Pain Location: Chest bruising Pain Descriptors / Indicators: Sore;Guarding Pain Intervention(s): Monitored during session;Repositioned    Home Living                      Prior Function            PT Goals (current goals can now be found in the care plan section) Acute Rehab PT Goals Patient Stated Goal: Return home Potential to Achieve Goals: Good Progress towards PT goals: Progressing toward goals    Frequency    Min 3X/week      PT Plan Current plan remains  appropriate    Co-evaluation              AM-PAC PT "6 Clicks" Daily Activity  Outcome Measure  Difficulty turning over in bed (including adjusting bedclothes, sheets and blankets)?: None Difficulty moving from lying on back to sitting on the side of the bed? : None Difficulty sitting down on and standing up from a chair with arms (e.g., wheelchair, bedside commode, etc,.)?: A Little Help needed moving to and from a bed to chair (including a wheelchair)?: A Little Help needed walking in hospital room?: A Little Help needed climbing 3-5 steps with a railing? : A Little 6 Click Score: 20    End of Session Equipment Utilized During Treatment: Gait belt Activity Tolerance: Patient limited by fatigue Patient left: in chair;with call bell/phone within reach Nurse Communication: Mobility status(informed nurse tech of need to place purewick) PT Visit Diagnosis: Other abnormalities of gait and mobility (R26.89)     Time: 5945-8592 PT Time Calculation (min) (ACUTE ONLY): 26 min  Charges:  $Gait Training: 8-22 mins $Therapeutic Activity: 8-22 mins                    G CodesGovernor Rooks, PTA pager [place] 10/12/2017, 3:47 PM

## 2017-10-12 NOTE — Progress Notes (Addendum)
Progress Note  Patient Name: [name] Date of Encounter: 10/12/2017  Primary Cardiologist: Jessica Dawley, MD  Subjective   No bleeding overnight. Seems to be in better spirits today. Wants to work with PT.   Inpatient Medications    Scheduled Meds: . acetaminophen  1,000 mg Oral BID  . arformoterol  15 mcg Nebulization BID  . atorvastatin  80 mg Oral q1800  . budesonide (PULMICORT) nebulizer solution  0.5 mg Nebulization BID  . calcium-vitamin D  2 tablet Oral Daily  . ferrous sulfate  325 mg Oral Q breakfast  . gabapentin  600 mg Oral QHS  . hydrALAZINE  10 mg Intravenous Once  . levothyroxine  100 mcg Oral QAC breakfast  . lidocaine  1 patch Transdermal Q24H  . mouth rinse  15 mL Mouth Rinse BID  . metoprolol tartrate  100 mg Oral BID  . multivitamin with minerals  1 tablet Oral Daily  . pantoprazole  40 mg Oral BID  . polyethylene glycol  17 g Oral Daily  . sodium chloride flush  10-40 mL Intracatheter Q12H   Continuous Infusions:  PRN Meds: albuterol, alum & mag hydroxide-simeth, bisacodyl, calcium carbonate, hydrALAZINE, HYDROcodone-acetaminophen, nitroGLYCERIN, ondansetron (ZOFRAN) IV, sodium chloride flush, zolpidem   Vital Signs    Vitals:   10/12/17 0010 10/12/17 0736 10/12/17 0739 10/12/17 0900  BP: (!) 125/37   (!) 134/49  Pulse: 73 78  90  Resp: 20 15    Temp: 98.1 F (36.7 C)     TempSrc: Oral     SpO2: 95% 98% 98% 96%  Weight:      Height:        Intake/Output Summary (Last 24 hours) at 10/12/2017 1012 Last data filed at 10/11/2017 2159 Gross per 24 hour  Intake 434.17 ml  Output -  Net 434.17 ml   Filed Weights   09/30/17 1035 10/06/17 2351 10/10/17 0506  Weight: 137 lb (62.1 kg) 97 lb 10.6 oz (44.3 kg) 97 lb 7.1 oz (44.2 kg)    Telemetry    SR - Personally Reviewed  ECG    N/a - Personally Reviewed  Physical Exam   General: Thin frail older W female appearing in no acute distress. Head: Normocephalic, atraumatic.  Neck:  Supple, no JVD. Lungs:  Resp regular and unlabored, CTA. Heart: RRR, S1, S2, no S3, S4, systolic murmur; no rub. Abdomen: Soft, non-tender, non-distended with normoactive bowel sounds.  Extremities: No clubbing, cyanosis, edema. Distal pedal pulses are 2+ bilaterally. Neuro: Alert and oriented X 3. Moves all extremities spontaneously. Psych: Normal affect.  Labs    Chemistry Recent Labs  Lab 10/08/17 0231 10/10/17 0754 10/12/17 0252  NA 139 138 138  K 4.0 3.9 3.6  CL 109 109 110  CO2 23 20* 20*  GLUCOSE 104* 98 84  BUN 14 21* 21*  CREATININE 1.00 0.84 1.03*  CALCIUM 9.1 8.8* 8.5*  GFRNONAA 53* >60 51*  GFRAA >60 >60 60*  ANIONGAP 7 9 8      Hematology Recent Labs  Lab 10/10/17 0754 10/11/17 0014 10/11/17 1549 10/12/17 0252  WBC 8.8 7.6  --  7.6  RBC 3.31* 2.58*  --  2.99*  HGB 9.4* 7.4* 7.5* 8.5*  HCT 29.7* 23.2* 23.6* 26.9*  MCV 89.7 89.9  --  90.0  MCH 28.4 28.7  --  28.4  MCHC 31.6 31.9  --  31.6  RDW 23.7* 23.7*  --  21.9*  PLT 320 299  --  297  Cardiac EnzymesNo results for input(s): TROPONINI in the last 168 hours. No results for input(s): TROPIPOC in the last 168 hours.   BNPNo results for input(s): BNP, PROBNP in the last 168 hours.   DDimer No results for input(s): DDIMER in the last 168 hours.    Radiology    No results found.  Cardiac Studies    Patient Profile     77 y.o. female with abnormal coronary CT, CKD stage III, mild carotid stenosis, hypothyroidism, GERD, NSCSL status post right upper lobectomy/XRT who initially presented for elective cardiac catheterization on 3/14 and had a DES placed in the RCA. There wasan apparent dissection versus thrombosis following the initial cath that resulted in repeat cath and intubation with associatedVfib arrest. A second stent was placed overlapping the distal of the previous stent in the RCA with grade 3 TIMI flow.Subsequent hospital course has been complicated by acute GI bleeding with  melena, anemia requiring multiple transfusions, and identification of nonbleeding gastric ulcers.  Assessment & Plan    1. CAD: Recent DES placement in the RCA complicated by stent thrombosis and treated with another stent. She has been on Plavix. She is having no chest pain. Given GI bleeding, we had planned on continuing Plavix alone with no ASA. [name] made the decision yesterday to stop her plavix on her own. She is aware of the high risk involved with this and accepts this. No events noted overnight -- continue statin and beta blocker.  2. GI bleeding:Question source of bleeding-gastric ulcers vs AVMs vs colon ulcers. She is being followed by GI. She is s/p transfusion of 2 units pRBCs 10/11/17. H/H stable at 8.5 this morning. Seen by [name] this morning. Discussed the option of possible enteroscopy if bleeding reoccurs but she does not want to be transferred.  -- CBC in the am  3. HTN:BP is controlled.  Dispo: Plans to work with PT today. Plan is to dc to rehab once she remains clinically stable.    Signed, Jessica Bellis, NP  10/12/2017, 10:12 AM  Pager # [phone]   For questions or updates, please contact [place] Please consult [email or web address] for contact info under Cardiology/STEMI.  I have personally seen and examined this patient. I agree with the assessment and plan as outlined above.  She is now off of Plavix. She made this informed decision. H/H stable. Continue to monitor. GI following. Pt refuses transfer to [facility].   [name] 10/12/2017 12:23 PM

## 2017-10-13 LAB — CBC
HEMATOCRIT: 33.2 % — AB (ref 36.0–46.0)
Hemoglobin: 10.1 g/dL — ABNORMAL LOW (ref 12.0–15.0)
MCH: 28.7 pg (ref 26.0–34.0)
MCHC: 30.4 g/dL (ref 30.0–36.0)
MCV: 94.3 fL (ref 78.0–100.0)
Platelets: 299 10*3/uL (ref 150–400)
RBC: 3.52 MIL/uL — AB (ref 3.87–5.11)
RDW: 22.3 % — ABNORMAL HIGH (ref 11.5–15.5)
WBC: 4.2 10*3/uL (ref 4.0–10.5)

## 2017-10-13 LAB — BASIC METABOLIC PANEL
ANION GAP: 6 (ref 5–15)
BUN: 14 mg/dL (ref 6–20)
CALCIUM: 8.8 mg/dL — AB (ref 8.9–10.3)
CO2: 24 mmol/L (ref 22–32)
Chloride: 109 mmol/L (ref 101–111)
Creatinine, Ser: 0.83 mg/dL (ref 0.44–1.00)
GLUCOSE: 85 mg/dL (ref 65–99)
POTASSIUM: 3.5 mmol/L (ref 3.5–5.1)
Sodium: 139 mmol/L (ref 135–145)

## 2017-10-13 NOTE — Progress Notes (Signed)
[facility] PROGRESS NOTE Subjective Patient still having dark stools but is on oral iron.  She is up and moving around.  She now is completely off of antiplatelet agent  Objective: Vital signs in last 24 hours: Temp:  [97.6 F (36.4 C)-98.4 F (36.9 C)] 97.6 F (36.4 C) (04/05 0552) Pulse Rate:  [74-90] 74 (04/04 1712) Resp:  [14-17] 16 (04/05 0552) BP: (127-147)/(39-65) 127/55 (04/05 0552) SpO2:  [94 %-100 %] 100 % (04/05 0048) Last BM Date: 10/12/17  Intake/Output from previous day: 04/04 0701 - 04/05 0700 In: 560 [P.O.:550; I.V.:10] Out: 650 [Urine:650] Intake/Output this shift: No intake/output data recorded.    Lab Results: Recent Labs    10/10/17 0754 10/11/17 0014 10/11/17 1549 10/12/17 0252  WBC 8.8 7.6  --  7.6  HGB 9.4* 7.4* 7.5* 8.5*  HCT 29.7* 23.2* 23.6* 26.9*  PLT 320 299  --  297   BMET Recent Labs    10/10/17 0754 10/12/17 0252 10/13/17 0257  NA 138 138 139  K 3.9 3.6 3.5  CL 109 110 109  CO2 20* 20* 24  CREATININE 0.84 1.03* 0.83   LFT No results for input(s): PROT, AST, ALT, ALKPHOS, BILITOT, BILIDIR, IBILI in the last 72 hours. PT/INR No results for input(s): LABPROT, INR in the last 72 hours. PANCREAS No results for input(s): LIPASE in the last 72 hours.       Studies/Results: No results found.  Medications: I have reviewed the patient's current medications.  Assessment:   1.  Chronic GI bleed.  Patient has had ulcers on 2 EGDs last one was not actively bleeding and bleeding lesion on capsule endoscopy in the mid small bowel.  Unclear if this can be reached even by double balloon enteroscopy per Dr. Michail Sermon.  Apparently the only place that does double balloon enteroscopy is [facility] she is not interested in being transferred to [facility].  The decision was made to hold all antiplatelet agents and see if her bleeding would resolve.  She is been off Plavix now for a couple of days   Plan: Continue to observe  off of antiplatelet agents.  We will continue to follow her and if she continues to bleed off of the antiplatelet agents she will consider small bowel enteroscopy here to see if the bleeding site could be reached.  Dr Penelope Coop will see her over the weekend.   Nancy Fetter 10/13/2017, 7:50 AM  This note was created using voice recognition software. Minor errors may Have occurred unintentionally.  Pager: [phone] If no answer or after hours call [phone]

## 2017-10-13 NOTE — Progress Notes (Signed)
Progress Note  Patient Name: [name] Date of Encounter: 10/13/2017  Primary Cardiologist: Ena Dawley, MD  Subjective   No complaints. Dark stools but on iron.   Inpatient Medications    Scheduled Meds: . acetaminophen  1,000 mg Oral BID  . arformoterol  15 mcg Nebulization BID  . atorvastatin  80 mg Oral q1800  . budesonide (PULMICORT) nebulizer solution  0.5 mg Nebulization BID  . calcium-vitamin D  2 tablet Oral Daily  . ferrous sulfate  325 mg Oral Q breakfast  . gabapentin  600 mg Oral QHS  . hydrALAZINE  10 mg Intravenous Once  . levothyroxine  100 mcg Oral QAC breakfast  . lidocaine  1 patch Transdermal Q24H  . mouth rinse  15 mL Mouth Rinse BID  . metoprolol tartrate  100 mg Oral BID  . multivitamin with minerals  1 tablet Oral Daily  . pantoprazole  40 mg Oral BID  . polyethylene glycol  17 g Oral Daily  . sodium chloride flush  10-40 mL Intracatheter Q12H   Continuous Infusions:  PRN Meds: albuterol, alum & mag hydroxide-simeth, bisacodyl, calcium carbonate, hydrALAZINE, HYDROcodone-acetaminophen, nitroGLYCERIN, ondansetron (ZOFRAN) IV, sodium chloride flush, zolpidem   Vital Signs    Vitals:   10/13/17 0048 10/13/17 0552 10/13/17 0805 10/13/17 0938  BP: (!) 127/39 (!) 127/55 (!) 131/57 (!) 146/55  Pulse:   72 87  Resp: 14 16 18 18   Temp: 97.6 F (36.4 C) 97.6 F (36.4 C) 98 F (36.7 C) 97.8 F (36.6 C)  TempSrc: Oral Oral Oral Oral  SpO2: 100%  97%   Weight:      Height:        Intake/Output Summary (Last 24 hours) at 10/13/2017 0950 Last data filed at 10/13/2017 0136 Gross per 24 hour  Intake 560 ml  Output 650 ml  Net -90 ml   Filed Weights   09/30/17 1035 10/06/17 2351 10/10/17 0506  Weight: 137 lb (62.1 kg) 97 lb 10.6 oz (44.3 kg) 97 lb 7.1 oz (44.2 kg)    Telemetry    Sinus - Personally Reviewed  ECG    No am ekg   Physical Exam   General: Thin, frail female in NAD.   HEENT: OP clear, mucus membranes moist  SKIN:  warm, dry. No rashes. Neuro: No focal deficits  Musculoskeletal: Muscle strength 5/5 all ext  Psychiatric: Mood and affect normal  Neck: No JVD, no carotid bruits, no thyromegaly, no lymphadenopathy.  Lungs:Clear bilaterally, no wheezes, rhonci, crackles Cardiovascular: Regular rate and rhythm. No murmurs, gallops or rubs. Abdomen:Soft. Bowel sounds present. Non-tender.  Extremities: No lower extremity edema. Pulses are 2 + in the bilateral DP/PT.    Labs    Chemistry Recent Labs  Lab 10/10/17 0754 10/12/17 0252 10/13/17 0257  NA 138 138 139  K 3.9 3.6 3.5  CL 109 110 109  CO2 20* 20* 24  GLUCOSE 98 84 85  BUN 21* 21* 14  CREATININE 0.84 1.03* 0.83  CALCIUM 8.8* 8.5* 8.8*  GFRNONAA >60 51* >60  GFRAA >60 60* >60  ANIONGAP 9 8 6      Hematology Recent Labs  Lab 10/10/17 0754 10/11/17 0014 10/11/17 1549 10/12/17 0252  WBC 8.8 7.6  --  7.6  RBC 3.31* 2.58*  --  2.99*  HGB 9.4* 7.4* 7.5* 8.5*  HCT 29.7* 23.2* 23.6* 26.9*  MCV 89.7 89.9  --  90.0  MCH 28.4 28.7  --  28.4  MCHC 31.6 31.9  --  31.6  RDW 23.7* 23.7*  --  21.9*  PLT 320 299  --  297    Cardiac EnzymesNo results for input(s): TROPONINI in the last 168 hours. No results for input(s): TROPIPOC in the last 168 hours.   BNPNo results for input(s): BNP, PROBNP in the last 168 hours.   DDimer No results for input(s): DDIMER in the last 168 hours.    Radiology    No results found.  Cardiac Studies    Patient Profile     77 y.o. female with abnormal coronary CT, CKD stage III, mild carotid stenosis, hypothyroidism, GERD, NSCSL status post right upper lobectomy/XRT who initially presented for elective cardiac catheterization on 3/14 and had a DES placed in the RCA. There wasan apparent dissection versus thrombosis following the initial cath that resulted in repeat cath and intubation with associatedVfib arrest. A second stent was placed overlapping the distal of the previous stent in the RCA with  grade 3 TIMI flow.Subsequent hospital course has been complicated by acute GI bleeding with melena, anemia requiring multiple transfusions, and identification of nonbleeding gastric ulcers.She has chosen to stop Plavix.   Assessment & Plan    1. CAD without angina: Recent DES placement in the RCA complicated by stent thrombosis and treated with another stent. She has been on Plavix but she has decided to stop Plavix given limited options to stop her GI bleeding. She understands the risk of stent thrombosis off of Plavix. Will continue statin and beta blocker. .  2. GI bleeding:This seems to be stable. Source of GI bleeding is gastric ulcers vs AVMs vs colon ulcers. She is being seen by GI. Will repeat CBC this am.   3. HTN:BP controlled  [name] 10/13/2017 9:50 AM

## 2017-10-13 NOTE — Progress Notes (Signed)
Pt up in recliner, looks good. We will await OT as they are planning to see her today to ambulate. Discussed with pt MI, stent, restrictions, increasing activity, diet, NTG, and CRPII. Good reception, will refer to [place]. Pt now much more motivated. We will f/u tomorrow. [place], ACSM 1:52 PM 10/13/2017

## 2017-10-14 LAB — CBC
HCT: 28 % — ABNORMAL LOW (ref 36.0–46.0)
Hemoglobin: 8.9 g/dL — ABNORMAL LOW (ref 12.0–15.0)
MCH: 29.9 pg (ref 26.0–34.0)
MCHC: 31.8 g/dL (ref 30.0–36.0)
MCV: 94 fL (ref 78.0–100.0)
PLATELETS: 250 10*3/uL (ref 150–400)
RBC: 2.98 MIL/uL — ABNORMAL LOW (ref 3.87–5.11)
RDW: 21.9 % — ABNORMAL HIGH (ref 11.5–15.5)
WBC: 4.9 10*3/uL (ref 4.0–10.5)

## 2017-10-14 MED ORDER — LOSARTAN POTASSIUM 25 MG PO TABS
25.0000 mg | ORAL_TABLET | Freq: Every day | ORAL | Status: DC
  Administered 2017-10-14: 25 mg via ORAL
  Filled 2017-10-14: qty 1

## 2017-10-14 NOTE — Progress Notes (Signed)
The patient's hemoglobin dropped to 8.9 from 10.1 yesterday. Her stools are dark. She is on iron. Her Plavix has now been held for about 4 days. It was recommended to her previously to go to a tertiary referral center where they do double balloon enteroscopy for a bleeding lesion in the small intestine. She does not wish to do this.She wants to just stay off Plavix and see if the bleeding stops. She is aware of the risks of clotting off her recent coronary stent, that she says she is willing to take the risk and see what happens. Would recommend transfusion as needed in the interim.

## 2017-10-14 NOTE — Progress Notes (Signed)
Occupational Therapy Treatment Patient Details Name: [name] MRN: [id number] DOB: [birth year] Today's Date: 10/14/2017    History of present illness Pt is a 77 y.o. female admitted 09/21/17 for elective cardiac cath. There wasan apparent dissection versus thrombosis following the initial cath that resulted in repeat cath 3/15 and intubation with associated A-fib arrest requiring CPR; a second stent was placed. Pt with acute GI bleed, s/p EGD with propofol 3/21. Additional EGD 3/23; pt with recurrent upper GI bleed with melena. PMH includes CKD III, HTN, COPD, non-small cell lung cancer (radiation 2013, 2015), arthritis.   OT comments  Pt. Seen for skilled OT.  Reports feeling better.  Able to complete bed mobility, LB dressing, and stand pivot to recliner.  Reviewed energy conservation tech.  Pt. Also initiating rest breaks properly throughout session.   Follow Up Recommendations  SNF;Supervision/Assistance - 24 hour    Equipment Recommendations       Recommendations for Other Services      Precautions / Restrictions Precautions Precautions: Fall Precaution Comments: Chest soreness/brusing post-CPR Restrictions Weight Bearing Restrictions: No       Mobility Bed Mobility Overal bed mobility: Modified Independent                Transfers Overall transfer level: Needs assistance     Sit to Stand: Min guard Stand pivot transfers: Min guard            Balance                                           ADL either performed or assessed with clinical judgement   ADL Overall ADL's : Needs assistance/impaired     Grooming: Oral care;Sitting;Set up;Wash/dry face               Lower Body Dressing: Set up;Bed level Lower Body Dressing Details (indicate cue type and reason): pt. in bed with hob elevated, able to don/doff socks by bringing each LE towards chest.  states she prefers this to eob so "blood wont rush to her head" bending  forward Toilet Transfer: Min Designer, jewellery Details (indicate cue type and reason): simulated from eob to recliner         Functional mobility during ADLs: Min guard       Vision       Perception     Praxis      Cognition Arousal/Alertness: Awake/alert Behavior During Therapy: WFL for tasks assessed/performed Overall Cognitive Status: Within Functional Limits for tasks assessed                                          Exercises     Shoulder Instructions       General Comments  pt. Speaking freely throughout session that she feels good with her decision to stop plavix.  States "ive lived a life I never thought I would so if its my time it is, but im not going to take that medicine and im not going to [facility]".  States her family supports her decision and she feels really at peace with seeing how things go.  Plans to go to [facility] and return home.      Pertinent Vitals/ Pain       Pain Assessment:  No/denies pain  Home Living                                          Prior Functioning/Environment              Frequency  Min 2X/week        Progress Toward Goals  OT Goals(current goals can now be found in the care plan section)  Progress towards OT goals: Progressing toward goals     Plan Discharge plan remains appropriate    Co-evaluation                 AM-PAC PT "6 Clicks" Daily Activity     Outcome Measure   Help from another person eating meals?: None Help from another person taking care of personal grooming?: A Little Help from another person toileting, which includes using toliet, bedpan, or urinal?: A Lot Help from another person bathing (including washing, rinsing, drying)?: A Lot Help from another person to put on and taking off regular upper body clothing?: A Little Help from another person to put on and taking off regular lower body clothing?: A Lot 6 Click  Score: 16    End of Session Equipment Utilized During Treatment: Gait belt  OT Visit Diagnosis: Muscle weakness (generalized) (M62.81);Unsteadiness on feet (R26.81)   Activity Tolerance Patient tolerated treatment well   Patient Left in chair;with call bell/phone within reach   Nurse Communication Other (comment)(cna to apply pure wick)        Time: 9470-7615 OT Time Calculation (min): 26 min  Charges: OT General Charges $OT Visit: 1 Visit OT Treatments $Self Care/Home Management : 23-37 mins  Janice Coffin, COTA/L 10/14/2017, 10:43 AM

## 2017-10-14 NOTE — Progress Notes (Signed)
Progress Note  Patient Name: LENORIA NARINE Date of Encounter: 10/14/2017  Primary Cardiologist: Ena Dawley, MD  Subjective   CP with certain movements; no dypsnea; no hematochezia  Inpatient Medications    Scheduled Meds: . acetaminophen  1,000 mg Oral BID  . arformoterol  15 mcg Nebulization BID  . atorvastatin  80 mg Oral q1800  . budesonide (PULMICORT) nebulizer solution  0.5 mg Nebulization BID  . calcium-vitamin D  2 tablet Oral Daily  . ferrous sulfate  325 mg Oral Q breakfast  . gabapentin  600 mg Oral QHS  . hydrALAZINE  10 mg Intravenous Once  . levothyroxine  100 mcg Oral QAC breakfast  . lidocaine  1 patch Transdermal Q24H  . mouth rinse  15 mL Mouth Rinse BID  . metoprolol tartrate  100 mg Oral BID  . multivitamin with minerals  1 tablet Oral Daily  . pantoprazole  40 mg Oral BID  . polyethylene glycol  17 g Oral Daily  . sodium chloride flush  10-40 mL Intracatheter Q12H   Continuous Infusions:  PRN Meds: albuterol, alum & mag hydroxide-simeth, bisacodyl, calcium carbonate, hydrALAZINE, HYDROcodone-acetaminophen, nitroGLYCERIN, ondansetron (ZOFRAN) IV, sodium chloride flush, zolpidem   Vital Signs    Vitals:   10/13/17 2330 10/14/17 0426 10/14/17 0840 10/14/17 1029  BP: (!) 142/39 133/61 (!) 163/63   Pulse:      Resp: 17 15 19    Temp: 97.7 F (36.5 C) 97.6 F (36.4 C) 98 F (36.7 C)   TempSrc: Oral Oral Oral   SpO2: 100% 98% 94% 95%  Weight:      Height:        Intake/Output Summary (Last 24 hours) at 10/14/2017 1137 Last data filed at 10/14/2017 0858 Gross per 24 hour  Intake 480 ml  Output 550 ml  Net -70 ml   Filed Weights   09/30/17 1035 10/06/17 2351 10/10/17 0506  Weight: 137 lb (62.1 kg) 97 lb 10.6 oz (44.3 kg) 97 lb 7.1 oz (44.2 kg)    Telemetry    Sinus - Personally Reviewed  Physical Exam   General: Thin, WD female in NAD.   HEENT: normal Neuro: grossly intact Neck: supple Lungs:CTA Cardiovascular: RRR Abdomen:  Soft, NT ND Extremities: No edema    Labs    Chemistry Recent Labs  Lab 10/10/17 0754 10/12/17 0252 10/13/17 0257  NA 138 138 139  K 3.9 3.6 3.5  CL 109 110 109  CO2 20* 20* 24  GLUCOSE 98 84 85  BUN 21* 21* 14  CREATININE 0.84 1.03* 0.83  CALCIUM 8.8* 8.5* 8.8*  GFRNONAA >60 51* >60  GFRAA >60 60* >60  ANIONGAP 9 8 6      Hematology Recent Labs  Lab 10/12/17 0252 10/13/17 1015 10/14/17 0010  WBC 7.6 4.2 4.9  RBC 2.99* 3.52* 2.98*  HGB 8.5* 10.1* 8.9*  HCT 26.9* 33.2* 28.0*  MCV 90.0 94.3 94.0  MCH 28.4 28.7 29.9  MCHC 31.6 30.4 31.8  RDW 21.9* 22.3* 21.9*  PLT 297 299 250     Patient Profile     77 y.o. female with abnormal coronary CT, CKD stage III, mild carotid stenosis, hypothyroidism, GERD, NSCSL status post right upper lobectomy/XRT who initially presented for elective cardiac catheterization on 3/14 and had a DES placed in the RCA. There wasan apparent dissection versus thrombosis following the initial cath that resulted in repeat cath and intubation with associatedVfib arrest. A second stent was placed overlapping the distal of the previous  stent in the RCA with grade 3 TIMI flow.Subsequent hospital course has been complicated by acute GI bleeding with melena, anemia requiring multiple transfusions, and identification of nonbleeding gastric ulcers.She has chosen to stop Plavix.   Assessment & Plan    1. CAD without angina:  Patient is status post recent drug-eluting stent to the right coronary artery complicated by stent thrombosis.  This was treated with a second stent.  She is now off of aspirin and Plavix because of GI bleeding.  She elected to discontinue Plavix on her own and understands the risk of stent thrombosis, myocardial infarction and death.  We will continue with statin and beta-blocker.    2. GI bleeding:Source of GI bleeding is gastric ulcers vs AVMs vs colon ulcers.  Hemoglobin decreased from 10.1 to 8.9.  We will recheck tomorrow  morning.  No active bleeding.  3. HTN:BP elevated; will add low dose ARB and follow.  Kirk Ruths 10/14/2017 11:37 AM

## 2017-10-15 LAB — BASIC METABOLIC PANEL
ANION GAP: 8 (ref 5–15)
BUN: 9 mg/dL (ref 6–20)
CHLORIDE: 108 mmol/L (ref 101–111)
CO2: 25 mmol/L (ref 22–32)
Calcium: 9.1 mg/dL (ref 8.9–10.3)
Creatinine, Ser: 0.87 mg/dL (ref 0.44–1.00)
GFR calc Af Amer: >60 mL/min (ref >60)
GFR calc non Af Amer: >60 mL/min (ref >60)
GLUCOSE: 93 mg/dL (ref 65–99)
POTASSIUM: 3.9 mmol/L (ref 3.5–5.1)
Sodium: 141 mmol/L (ref 135–145)

## 2017-10-15 LAB — CBC
HCT: 28.6 % — ABNORMAL LOW (ref 36.0–46.0)
Hemoglobin: 8.7 g/dL — ABNORMAL LOW (ref 12.0–15.0)
MCH: 28.5 pg (ref 26.0–34.0)
MCHC: 30.4 g/dL (ref 30.0–36.0)
MCV: 93.8 fL (ref 78.0–100.0)
Platelets: 280 10*3/uL (ref 150–400)
RBC: 3.05 MIL/uL — ABNORMAL LOW (ref 3.87–5.11)
RDW: 20.3 % — ABNORMAL HIGH (ref 11.5–15.5)
WBC: 4.4 10*3/uL (ref 4.0–10.5)

## 2017-10-15 MED ORDER — LOSARTAN POTASSIUM 50 MG PO TABS
50.0000 mg | ORAL_TABLET | Freq: Every day | ORAL | Status: DC
  Administered 2017-10-15 – 2017-10-17 (×3): 50 mg via ORAL
  Filled 2017-10-15 (×3): qty 1

## 2017-10-15 NOTE — Progress Notes (Signed)
Patient stable. Hgb stable at this time. We will sign off but be available if you need [place] further.

## 2017-10-15 NOTE — Progress Notes (Addendum)
Progress Note  Patient Name: [name] Date of Encounter: 10/15/2017  Primary Cardiologist: Ena Dawley, MD  Subjective   No chest pain or dyspnea; black stool but on iron  Inpatient Medications    Scheduled Meds: . acetaminophen  1,000 mg Oral BID  . arformoterol  15 mcg Nebulization BID  . atorvastatin  80 mg Oral q1800  . budesonide (PULMICORT) nebulizer solution  0.5 mg Nebulization BID  . calcium-vitamin D  2 tablet Oral Daily  . ferrous sulfate  325 mg Oral Q breakfast  . gabapentin  600 mg Oral QHS  . hydrALAZINE  10 mg Intravenous Once  . levothyroxine  100 mcg Oral QAC breakfast  . lidocaine  1 patch Transdermal Q24H  . losartan  25 mg Oral Daily  . mouth rinse  15 mL Mouth Rinse BID  . metoprolol tartrate  100 mg Oral BID  . multivitamin with minerals  1 tablet Oral Daily  . pantoprazole  40 mg Oral BID  . polyethylene glycol  17 g Oral Daily  . sodium chloride flush  10-40 mL Intracatheter Q12H   Continuous Infusions:  PRN Meds: albuterol, alum & mag hydroxide-simeth, bisacodyl, calcium carbonate, hydrALAZINE, HYDROcodone-acetaminophen, nitroGLYCERIN, ondansetron (ZOFRAN) IV, sodium chloride flush, zolpidem   Vital Signs    Vitals:   10/14/17 2101 10/14/17 2140 10/15/17 0110 10/15/17 0735  BP:  (!) 148/57 (!) 143/51 (!) 161/66  Pulse:  77  68  Resp:   15   Temp:      TempSrc:      SpO2: 99%     Weight:      Height:        Intake/Output Summary (Last 24 hours) at 10/15/2017 0946 Last data filed at 10/14/2017 2200 Gross per 24 hour  Intake 250 ml  Output -  Net 250 ml   Filed Weights   09/30/17 1035 10/06/17 2351 10/10/17 0506  Weight: 137 lb (62.1 kg) 97 lb 10.6 oz (44.3 kg) 97 lb 7.1 oz (44.2 kg)    Telemetry    Sinus - Personally Reviewed  Physical Exam   General: WD NAD  HEENT: normal Neuro: no focal findings Neck: supple, no JVD Lungs:CTA, no wheeze Cardiovascular: RRR, no murmur Abdomen: Soft, NT ND, no  masses Extremities: No edema    Labs    Chemistry Recent Labs  Lab 10/12/17 0252 10/13/17 0257 10/15/17 0611  NA 138 139 141  K 3.6 3.5 3.9  CL 110 109 108  CO2 20* 24 25  GLUCOSE 84 85 93  BUN 21* 14 9  CREATININE 1.03* 0.83 0.87  CALCIUM 8.5* 8.8* 9.1  GFRNONAA 51* >60 >60  GFRAA 60* >60 >60  ANIONGAP 8 6 8      Hematology Recent Labs  Lab 10/13/17 1015 10/14/17 0010 10/15/17 0611  WBC 4.2 4.9 4.4  RBC 3.52* 2.98* 3.05*  HGB 10.1* 8.9* 8.7*  HCT 33.2* 28.0* 28.6*  MCV 94.3 94.0 93.8  MCH 28.7 29.9 28.5  MCHC 30.4 31.8 30.4  RDW 22.3* 21.9* 20.3*  PLT 299 250 280     Patient Profile     77 y.o. female with abnormal coronary CT, CKD stage III, mild carotid stenosis, hypothyroidism, GERD, NSCSL status post right upper lobectomy/XRT who initially presented for elective cardiac catheterization on 3/14 and had a DES placed in the RCA. There wasan apparent dissection versus thrombosis following the initial cath that resulted in repeat cath and intubation with associatedVfib arrest. A second stent was placed  overlapping the distal of the previous stent in the RCA with grade 3 TIMI flow.Subsequent hospital course has been complicated by acute GI bleeding with melena, anemia requiring multiple transfusions, and identification of nonbleeding gastric ulcers.She has chosen to stop Plavix.   Assessment & Plan    1. CAD without angina:  Patient is status post recent drug-eluting stent to the right coronary artery complicated by stent thrombosis.  This was treated with a second stent.  She is now off of aspirin and Plavix because of GI bleeding.  She elected to discontinue Plavix on her own because of recurrent bleeding and understands the risk of stent thrombosis, myocardial infarction and death.  We will continue with statin and beta-blocker.    2. GI bleeding:Source of GI bleeding is gastric ulcers vs AVMs vs colon ulcers.  Hemoglobin 8.7 today.  We will recheck  tomorrow morning.  Does not appear to be actively bleeding.  3. HTN:BP elevated; increase cozaar to 50 mg daily and follow. Probable DC in next 24-48 hrs if no chest pain and hgb stable.  Kirk Ruths 10/15/2017 9:46 AM

## 2017-10-16 LAB — BASIC METABOLIC PANEL
Anion gap: 7 (ref 5–15)
BUN: 8 mg/dL (ref 6–20)
CALCIUM: 9 mg/dL (ref 8.9–10.3)
CO2: 25 mmol/L (ref 22–32)
CREATININE: 0.85 mg/dL (ref 0.44–1.00)
Chloride: 108 mmol/L (ref 101–111)
GFR calc non Af Amer: >60 mL/min (ref >60)
GLUCOSE: 97 mg/dL (ref 65–99)
Potassium: 3.8 mmol/L (ref 3.5–5.1)
Sodium: 140 mmol/L (ref 135–145)

## 2017-10-16 LAB — CBC
HCT: 29.6 % — ABNORMAL LOW (ref 36.0–46.0)
Hemoglobin: 8.9 g/dL — ABNORMAL LOW (ref 12.0–15.0)
MCH: 28.3 pg (ref 26.0–34.0)
MCHC: 30.1 g/dL (ref 30.0–36.0)
MCV: 94 fL (ref 78.0–100.0)
PLATELETS: 281 10*3/uL (ref 150–400)
RBC: 3.15 MIL/uL — ABNORMAL LOW (ref 3.87–5.11)
RDW: 20.2 % — AB (ref 11.5–15.5)
WBC: 4.5 10*3/uL (ref 4.0–10.5)

## 2017-10-16 NOTE — Plan of Care (Signed)
Patient actively listened and affirmed information provided on self care and health related issues and symptoms of disease progression.

## 2017-10-16 NOTE — Progress Notes (Addendum)
The patient has been seen in conjunction with Vin Bhagat, PAC. All aspects of care have been considered and discussed. The patient has been personally interviewed, examined, and all clinical data has been reviewed.   Now off Plavix for 6 days without acute ischemia or symptoms.  Hemoglobin is stable.  Plan ambulate today and discharged to [facility] for rehab tomorrow.  Progress Note  Patient Name: [name] Date of Encounter: 10/16/2017  Primary Cardiologist: Ena Dawley, MD   Subjective   No chest pain or dyspnea. Fatigue improving. She wish to go to [employer] [facility].  Black stool on Iron.   Inpatient Medications    Scheduled Meds: . acetaminophen  1,000 mg Oral BID  . arformoterol  15 mcg Nebulization BID  . atorvastatin  80 mg Oral q1800  . budesonide (PULMICORT) nebulizer solution  0.5 mg Nebulization BID  . calcium-vitamin D  2 tablet Oral Daily  . ferrous sulfate  325 mg Oral Q breakfast  . gabapentin  600 mg Oral QHS  . hydrALAZINE  10 mg Intravenous Once  . levothyroxine  100 mcg Oral QAC breakfast  . lidocaine  1 patch Transdermal Q24H  . losartan  50 mg Oral Daily  . mouth rinse  15 mL Mouth Rinse BID  . metoprolol tartrate  100 mg Oral BID  . multivitamin with minerals  1 tablet Oral Daily  . pantoprazole  40 mg Oral BID  . polyethylene glycol  17 g Oral Daily  . sodium chloride flush  10-40 mL Intracatheter Q12H   Continuous Infusions:  PRN Meds: albuterol, alum & mag hydroxide-simeth, bisacodyl, calcium carbonate, hydrALAZINE, HYDROcodone-acetaminophen, nitroGLYCERIN, ondansetron (ZOFRAN) IV, sodium chloride flush, zolpidem   Vital Signs    Vitals:   10/16/17 0616 10/16/17 0844 10/16/17 0904 10/16/17 0905  BP: (!) 166/65 115/89    Pulse:      Resp: 19 18    Temp: 97.8 F (36.6 C) 97.8 F (36.6 C)    TempSrc: Oral Oral    SpO2: 97% (!) 84% 93% 93%  Weight:      Height:        Intake/Output Summary (Last 24 hours) at  10/16/2017 0938 Last data filed at 10/16/2017 0600 Gross per 24 hour  Intake 610 ml  Output 750 ml  Net -140 ml   Filed Weights   09/30/17 1035 10/06/17 2351 10/10/17 0506  Weight: 137 lb (62.1 kg) 97 lb 10.6 oz (44.3 kg) 97 lb 7.1 oz (44.2 kg)    Telemetry    SR- Personally Reviewed  ECG    N/A  Physical Exam   GEN: No acute distress.   Neck: No JVD Cardiac: RRR, no murmurs, rubs, or gallops.  Respiratory: Clear to auscultation bilaterally. GI: Soft, nontender, non-distended  MS: No edema; No deformity. Neuro:  Nonfocal  Psych: Normal affect   Labs    Chemistry Recent Labs  Lab 10/13/17 0257 10/15/17 0611 10/16/17 0626  NA 139 141 140  K 3.5 3.9 3.8  CL 109 108 108  CO2 24 25 25   GLUCOSE 85 93 97  BUN 14 9 8   CREATININE 0.83 0.87 0.85  CALCIUM 8.8* 9.1 9.0  GFRNONAA >60 >60 >60  GFRAA >60 >60 >60  ANIONGAP 6 8 7      Hematology Recent Labs  Lab 10/14/17 0010 10/15/17 0611 10/16/17 0626  WBC 4.9 4.4 4.5  RBC 2.98* 3.05* 3.15*  HGB 8.9* 8.7* 8.9*  HCT 28.0* 28.6* 29.6*  MCV 94.0  93.8 94.0  MCH 29.9 28.5 28.3  MCHC 31.8 30.4 30.1  RDW 21.9* 20.3* 20.2*  PLT 250 280 281    Cardiac EnzymesNo results for input(s): TROPONINI in the last 168 hours. No results for input(s): TROPIPOC in the last 168 hours.   BNPNo results for input(s): BNP, PROBNP in the last 168 hours.   DDimer No results for input(s): DDIMER in the last 168 hours.   Radiology    No results found.  Cardiac Studies   CORONARY STENT INTERVENTION  09/21/17  LEFT HEART CATH AND CORONARY ANGIOGRAPHY  Conclusion    Segmental proximal to mid 90% RCA stenosis. This lesion was treated with stenting using overlapping Synergy 2.5 mm stents to postdilated to 3 mm in diameter with TIMI grade III flow. 0% stenosis was noted post procedure.  Ostial 50-60% circumflex narrowing.  Luminal irregularities throughout the proximal to mid LAD. Very distal eccentric tandem 70% stenoses near the  apex. No focally high-grade stenosis is noted.  Normal left main  Low normal LV systolic function in the 08-65% range. Upper normal left ventricular end-diastolic pressure of 17 mmHg.  RECOMMENDATIONS:   Aspirin and Plavix for at least 6 months in the setting of chronic stable ischemic heart disease. Given overlapping stents it would be preferable to continue dual antiplatelet therapy for 12 months.  Aggressive risk factor modification using high intensity statin therapy for as long as tolerated. I have started atorvastatin 80 mg daily and discontinued pravastatin. Blood pressure control with target 130/80 mmHg or less.  Phase 2 cardiac rehab.  Overnight stay since the patient lives alone, is relatively frail, has had some bleeding from the radial cath site.   Procedures   Coronary/Graft Acute MI Revascularization  09/21/17  Intravascular Ultrasound/IVUS  LEFT HEART CATH AND CORONARY ANGIOGRAPHY  Conclusion    Acute occlusion of the mid right coronary distal to the previously placed stents from earlier in the day. Acute occlusion was accompanied by ventricular fibrillation. The patient was successfully resuscitated.  Successful recanalization of the right coronary and placement of an additional stent overlapping the distal margin of the previously placed stent. Final stent diameter 3.25 mm throughout the entire stented segment from distal to proximal. 3.5 mm diameter Bonnie balloon was used at the very proximal stent margin after intravascular ultrasound demonstrated decreased apposition. TIMI grade III flow with no evidence of stenosis postprocedure.  Intravascular ultrasound was used and demonstrated acceptable stent apposition from distal to proximal with the exception of the proximal margin which was underexpanded. As noted above a 3.5 balloon was used to further expand the stent.  RECOMMENDATION:   Aggrastat for 10 hours.  Continue Plavix.  Critical care  medicine to manage the patient's ventilator.  Continue IV Versed and fentanyl for sedation while on the ventilator.  Discussed the situation with family. Do not anticipate discharge for several days.  IV hydration and tract kidney function.   Patient Profile     77 y.o. female with abnormal coronary CT, CKD stage III, mild carotid stenosis, hypothyroidism, GERD, NSCSL status post right upper lobectomy/XRT who initially presented for elective cardiac catheterization on 3/14 and had a DES placed in the RCA. There wasan apparent dissection versus thrombosis following the initial cath that resulted in repeat cath and intubation with associated A. fib arrest. A second stent was placed overlapping the distal of the previous stent in the RCA with grade 3 TIMI flow observed following placement of the first and second stents. Subsequent hospital course has  been complicated by acute GI bleeding with melena, anemia requiring multiple transfusions, and identification of nonbleeding gastric ulcers.She has chosen to stop Plavix.   Assessment & Plan    1. CAD without angina: Patient is status post recent drug-eluting stent to the right coronary artery complicated by stent thrombosis.  This was treated with a second stent.  She is now off of aspirin and Plavix because of GI bleeding.  She elected to discontinue Plavix on her own because of recurrent bleeding and understands the risk of stent thrombosis, myocardial infarction and death. - Continue statin and beta-blocker.  No recurrent chest pain.   2. GI bleeding:Source of GI bleeding is gastric ulcers vs AVMs vs colon ulcers.   - H & H stable and gradually improving. Last dose of Plavix 10/10/17.   3. HTN: - Stable on BB and ARB.   4. HLD: - 08/22/2017: Cholesterol, Total 193; HDL 53; LDL Calculated 113; Triglycerides 135 - Continue Lipitor 80mg  qd. Repeat labs as outpatient in few weeks.    For questions or updates, please contact [place] Please consult [email or web address] for contact info under Cardiology/STEMI.      Jarrett Soho, PA  10/16/2017, 9:38 AM

## 2017-10-16 NOTE — Progress Notes (Signed)
CARDIAC REHAB PHASE I   PRE:  Rate/Rhythm: 80 SR    BP: sitting 159/60    SaO2:   MODE:  Ambulation: 340 ft   POST:  Rate/Rhythm: 94 SR    BP: sitting 121/99     SaO2: 97 RA  Pt feeling well today. Able to get up independently and walk with RW. We followed with rollator so she could sit and rest. She walked 170 ft, sat and rested 3 min then walked back another 170 ft. Only c/o is SOB. To recliner. Pt is motivated to progress.  [id number]   [place], ACSM 10/16/2017 10:33 AM

## 2017-10-16 NOTE — Progress Notes (Signed)
PT Cancellation Note  Patient Details Name: [name] MRN: [id number] DOB: 01-20-1941   Cancelled Treatment:    Reason Eval/Treat Not Completed: Other (comment) attempted to work with patient, however she was actively working with other hospital services and not available at this time. Plan to attempt to try back later as/if schedule allows.    Deniece Ree PT, DPT, CBIS  Supplemental Physical Therapist [facility]   Pager [phone]

## 2017-10-16 NOTE — Progress Notes (Signed)
Physical Therapy Treatment Patient Details Name: [name] MRN: [id number] DOB: 12/25/1940 Today's Date: 10/16/2017    History of Present Illness Pt is a 77 y.o. female admitted 09/21/17 for elective cardiac cath. There wasan apparent dissection versus thrombosis following the initial cath that resulted in repeat cath 3/15 and intubation with associated A-fib arrest requiring CPR; a second stent was placed. Pt with acute GI bleed, s/p EGD with propofol 3/21. Additional EGD 3/23; pt with recurrent upper GI bleed with melena. PMH includes CKD III, HTN, COPD, non-small cell lung cancer (radiation 2013, 2015), arthritis.    PT Comments    Patient received in bed with visitor present, pleasant and willing to participate in PT today. She is able to perform bed mobility with Mod(I) and requires only Min guard to S for functional transfers and gait approximately 380f with one seated rest break due to fatigue. She remains motivated to continue to participate and improve with PT moving forward. She was left in the bed with visitor present and all needs otherwise met this afternoon.    Follow Up Recommendations  SNF;Supervision for mobility/OOB     Equipment Recommendations  Rolling walker with 5" wheels    Recommendations for Other Services OT consult     Precautions / Restrictions Precautions Precautions: Fall Precaution Comments: Chest soreness/brusing post-CPR Restrictions Weight Bearing Restrictions: No    Mobility  Bed Mobility Overal bed mobility: Modified Independent Bed Mobility: Supine to Sit;Sit to Supine     Supine to sit: Modified independent (Device/Increase time) Sit to supine: Modified independent (Device/Increase time)      Transfers Overall transfer level: Needs assistance Equipment used: Rolling walker (2 wheeled) Transfers: Sit to/from Stand Sit to Stand: Min guard         General transfer comment: occasional cues for hand placement, overall safety and  technique appear to be improving   Ambulation/Gait Ambulation/Gait assistance: Supervision Ambulation Distance (Feet): 340 Feet Assistive device: Rolling walker (2 wheeled) Gait Pattern/deviations: Step-through pattern;Decreased stride length;Narrow base of support;Trunk flexed Gait velocity: Decreased   General Gait Details: gait speed and distance continue to improve, posture is also improving as well. She walked with RW today and had follow by friend with rollator as patient did nto want to bring chair.    Stairs            Wheelchair Mobility    Modified Rankin (Stroke Patients Only)       Balance Overall balance assessment: Needs assistance   Sitting balance-Leahy Scale: Good Sitting balance - Comments: Hesitant to move without holding onto heart pillow     Standing balance-Leahy Scale: Fair Standing balance comment: Reliant on UE support and external assist from therapist                             Cognition Arousal/Alertness: Awake/alert Behavior During Therapy: WFL for tasks assessed/performed Overall Cognitive Status: Within Functional Limits for tasks assessed                                        Exercises      General Comments        Pertinent Vitals/Pain Pain Assessment: No/denies pain Pain Score: 0-No pain Pain Intervention(s): Limited activity within patient's tolerance;Monitored during session    Home Living  Prior Function            PT Goals (current goals can now be found in the care plan section) Acute Rehab PT Goals Patient Stated Goal: Return home PT Goal Formulation: With patient Time For Goal Achievement: 10/24/17 Potential to Achieve Goals: Good Progress towards PT goals: Progressing toward goals    Frequency    Min 3X/week      PT Plan Current plan remains appropriate    Co-evaluation              AM-PAC PT "6 Clicks" Daily Activity  Outcome  Measure  Difficulty turning over in bed (including adjusting bedclothes, sheets and blankets)?: None Difficulty moving from lying on back to sitting on the side of the bed? : None Difficulty sitting down on and standing up from a chair with arms (e.g., wheelchair, bedside commode, etc,.)?: None Help needed moving to and from a bed to chair (including a wheelchair)?: A Little Help needed walking in hospital room?: A Little Help needed climbing 3-5 steps with a railing? : A Little 6 Click Score: 21    End of Session   Activity Tolerance: Patient tolerated treatment well Patient left: in bed;with call bell/phone within reach;with family/visitor present   PT Visit Diagnosis: Other abnormalities of gait and mobility (R26.89)     Time: 7408-1448 PT Time Calculation (min) (ACUTE ONLY): 14 min  Charges:  $Gait Training: 8-22 mins                    G Codes:       Deniece Ree PT, DPT, CBIS  Supplemental Physical Therapist [place]   Pager [phone]

## 2017-10-16 NOTE — Care Management Important Message (Signed)
Important Message  Patient Details  Name: [name] MRN: [id number] Date of Birth: 12-Oct-1940   Medicare Important Message Given:  Yes    Kapil Petropoulos P Nicola Heinemann 10/16/2017, 2:54 PM

## 2017-10-16 NOTE — Progress Notes (Signed)
Clinical Social Worker following patient for discharge needs. CSW met patient at bedside to discuss upcoming discharge. Patient stated she would still like to go to [place]. CSW spoke with facility and they stated they have a bed for patient in the morning.   Rhea Pink, MSW,  [place]

## 2017-10-17 DIAGNOSIS — E46 Unspecified protein-calorie malnutrition: Secondary | ICD-10-CM | POA: Diagnosis not present

## 2017-10-17 DIAGNOSIS — I25110 Atherosclerotic heart disease of native coronary artery with unstable angina pectoris: Secondary | ICD-10-CM | POA: Diagnosis not present

## 2017-10-17 DIAGNOSIS — I462 Cardiac arrest due to underlying cardiac condition: Secondary | ICD-10-CM | POA: Diagnosis not present

## 2017-10-17 DIAGNOSIS — Z978 Presence of other specified devices: Secondary | ICD-10-CM | POA: Diagnosis not present

## 2017-10-17 DIAGNOSIS — I2510 Atherosclerotic heart disease of native coronary artery without angina pectoris: Secondary | ICD-10-CM | POA: Diagnosis not present

## 2017-10-17 DIAGNOSIS — R0989 Other specified symptoms and signs involving the circulatory and respiratory systems: Secondary | ICD-10-CM | POA: Diagnosis not present

## 2017-10-17 DIAGNOSIS — K922 Gastrointestinal hemorrhage, unspecified: Secondary | ICD-10-CM | POA: Diagnosis not present

## 2017-10-17 DIAGNOSIS — I10 Essential (primary) hypertension: Secondary | ICD-10-CM | POA: Diagnosis not present

## 2017-10-17 DIAGNOSIS — R2689 Other abnormalities of gait and mobility: Secondary | ICD-10-CM | POA: Diagnosis not present

## 2017-10-17 DIAGNOSIS — E039 Hypothyroidism, unspecified: Secondary | ICD-10-CM | POA: Diagnosis not present

## 2017-10-17 DIAGNOSIS — R278 Other lack of coordination: Secondary | ICD-10-CM | POA: Diagnosis not present

## 2017-10-17 DIAGNOSIS — I447 Left bundle-branch block, unspecified: Secondary | ICD-10-CM | POA: Diagnosis not present

## 2017-10-17 DIAGNOSIS — D649 Anemia, unspecified: Secondary | ICD-10-CM | POA: Diagnosis not present

## 2017-10-17 DIAGNOSIS — I4891 Unspecified atrial fibrillation: Secondary | ICD-10-CM | POA: Diagnosis not present

## 2017-10-17 DIAGNOSIS — N183 Chronic kidney disease, stage 3 (moderate): Secondary | ICD-10-CM | POA: Diagnosis not present

## 2017-10-17 DIAGNOSIS — E785 Hyperlipidemia, unspecified: Secondary | ICD-10-CM | POA: Diagnosis not present

## 2017-10-17 DIAGNOSIS — I469 Cardiac arrest, cause unspecified: Secondary | ICD-10-CM | POA: Diagnosis not present

## 2017-10-17 DIAGNOSIS — I499 Cardiac arrhythmia, unspecified: Secondary | ICD-10-CM | POA: Diagnosis not present

## 2017-10-17 DIAGNOSIS — E782 Mixed hyperlipidemia: Secondary | ICD-10-CM | POA: Diagnosis not present

## 2017-10-17 DIAGNOSIS — D62 Acute posthemorrhagic anemia: Secondary | ICD-10-CM | POA: Diagnosis not present

## 2017-10-17 DIAGNOSIS — C3490 Malignant neoplasm of unspecified part of unspecified bronchus or lung: Secondary | ICD-10-CM | POA: Diagnosis not present

## 2017-10-17 DIAGNOSIS — J9601 Acute respiratory failure with hypoxia: Secondary | ICD-10-CM | POA: Diagnosis not present

## 2017-10-17 DIAGNOSIS — M150 Primary generalized (osteo)arthritis: Secondary | ICD-10-CM | POA: Diagnosis not present

## 2017-10-17 DIAGNOSIS — M6281 Muscle weakness (generalized): Secondary | ICD-10-CM | POA: Diagnosis not present

## 2017-10-17 MED ORDER — ALBUTEROL SULFATE (2.5 MG/3ML) 0.083% IN NEBU: 2.5000 mg | INHALATION_SOLUTION | RESPIRATORY_TRACT | 12 refills | Status: DC | PRN

## 2017-10-17 MED ORDER — NITROGLYCERIN 0.4 MG SL SUBL: 0.4000 mg | SUBLINGUAL_TABLET | SUBLINGUAL | 0 refills | Status: DC | PRN

## 2017-10-17 MED ORDER — LIDOCAINE 5 % EX PTCH: 1.0000 | MEDICATED_PATCH | CUTANEOUS | 0 refills | Status: DC

## 2017-10-17 MED ORDER — ATORVASTATIN CALCIUM 80 MG PO TABS: 80.0000 mg | ORAL_TABLET | Freq: Every day | ORAL | Status: DC

## 2017-10-17 MED ORDER — METOPROLOL TARTRATE 100 MG PO TABS: 100.0000 mg | ORAL_TABLET | Freq: Two times a day (BID) | ORAL | Status: DC

## 2017-10-17 MED ORDER — BUDESONIDE 0.5 MG/2ML IN SUSP: 0.5000 mg | Freq: Two times a day (BID) | RESPIRATORY_TRACT | 12 refills | Status: DC

## 2017-10-17 MED ORDER — PANTOPRAZOLE SODIUM 40 MG PO TBEC: 40.0000 mg | DELAYED_RELEASE_TABLET | Freq: Two times a day (BID) | ORAL | Status: DC

## 2017-10-17 MED ORDER — ARFORMOTEROL TARTRATE 15 MCG/2ML IN NEBU: 15.0000 ug | INHALATION_SOLUTION | Freq: Two times a day (BID) | RESPIRATORY_TRACT | Status: DC

## 2017-10-17 MED ORDER — LOSARTAN POTASSIUM 50 MG PO TABS: 50.0000 mg | ORAL_TABLET | Freq: Every day | ORAL | Status: DC

## 2017-10-17 NOTE — Progress Notes (Signed)
Patient and daughter in room. Discharge instructions reviewed with patient including medication changes, follow up appointments, and after discharge care. Midline to LUE removed with catheter tip intact and no blood noted post removal. Patient belongings at bedside. Attempted to contact [place] x3 with no answer. Patient assisted by staff for dressing. PTAR at nurse's desk for transport to the facility.

## 2017-10-17 NOTE — Progress Notes (Signed)
CARDIAC REHAB PHASE I   PRE:  Rate/Rhythm: 72 SR    BP: sitting 158/70    SaO2:   MODE:  Ambulation: 340 ft   POST:  Rate/Rhythm: 78 SR    BP: sitting 147/66     SaO2:   Tolerated well with pushing rollator. Steady. C/o neuropathy pain in her feet. Sat x1 for rest. Reviewed NTG and CRPII. Pt doing well. [place], ACSM 10/17/2017 11:30 AM

## 2017-10-17 NOTE — Clinical Social Work Placement (Signed)
   CLINICAL SOCIAL WORK PLACEMENT  NOTE  Date:  10/17/2017  Patient Details  Name: [name] MRN: [id number] Date of Birth: 1940/10/22  Clinical Social Work is seeking post-discharge placement for this patient at the [place] level of care (*CSW will initial, date and re-position this form in  chart as items are completed):  Yes   Patient/family provided with [facility] Work Department's list of facilities offering this level of care within the geographic area requested by the patient (or if unable, by the patient's family).  Yes   Patient/family informed of their freedom to choose among providers that offer the needed level of care, that participate in Medicare, Medicaid or managed care program needed by the patient, have an available bed and are willing to accept the patient.  Yes   Patient/family informed of [place]'s ownership interest in [facility] and [facility], as well as of the fact that they are under no obligation to receive care at these facilities.  PASRR submitted to EDS on       PASRR number received on       Existing PASRR number confirmed on 10/17/17     FL2 transmitted to all facilities in geographic area requested by pt/family on 10/17/17     FL2 transmitted to all facilities within larger geographic area on       Patient informed that his/her managed care company has contracts with or will negotiate with certain facilities, including the following:            Patient/family informed of bed offers received.  Patient chooses bed at [facility]     Physician recommends and patient chooses bed at      Patient to be transferred to [facility] on 10/17/17.  Patient to be transferred to facility by [facility]     Patient family notified on 10/17/17 of transfer.  Name of family member notified:  patient stated family is aware     PHYSICIAN       Additional Comment:     _______________________________________________ Wende Neighbors, LCSW 10/17/2017, 12:06 PM

## 2017-10-17 NOTE — Progress Notes (Addendum)
CARDIOLOGY DISCHARGE  Admitted after elective catheterization and PCI of RCA.  Coronary disease was initially suspected from an abnormal CT FFR.  Has history of lung cancer, lives independently, and complained that led to cardiac workup was dyspnea on exertion.  Procedure was complicated by acute RCA occlusion 8 hours after the procedure associated with VF, resuscitation, and implantation of additional stents in RCA.  She suffered a sternal fracture.  Hospital course further complicated by the development of melena, hemorrhagic anemia, and multiple transfusions.  Bleeding was related to dual antiplatelet therapy on top of gastric ulcers, colonic ulcers, and multiple small bowel AVM/bleeding lesions.  Ultimately all antiplatelet therapy was discontinued, bleeding stopped, and she is being discharged to [facility] for rehab before going home.  P2 Y 12 on Plavix monotherapy was less than 10, suggesting hyper-responsiveness to the medication.  At the patient's request Plavix was discontinued.  Once stable and hemoglobin has recovered, it may be wise to consider aspirin 81 mg 3 times per week.  Follow-up with Dr. Ena Dawley.

## 2017-10-17 NOTE — Progress Notes (Signed)
Clinical Social Worker facilitated patient discharge including contacting patient family and facility to confirm patient discharge plans.  Clinical information faxed to facility and family agreeable with plan.  CSW arranged ambulance transport via [facility] to [facility] .  RN to call [phone] (pt will go in room 3217) for report prior to discharge.  Clinical Social Worker will sign off for now as social work intervention is no longer needed. Please consult [place] again if new need arises.  Rhea Pink, MSW, [place]

## 2017-10-17 NOTE — Discharge Summary (Addendum)
The patient has been seen in conjunction with Reino Bellis, NP. All aspects of care have been considered and discussed. The patient has been personally interviewed, examined, and all clinical data has been reviewed.   Please see my note from earlier today  Coronary stent for chronic stable coronary disease complicated by acute stent thrombosis greater than 6 hours after the procedure.  Acute occlusion accompanied by ventricular fibrillation and resuscitation.  Course further complicated by significant GI bleeding on dual antiplatelet therapy requiring transfusion and ultimate discontinuation of antiplatelet therapy.  Being discharged to [facility] for rehab prior to returning home.  Hopeful that low-dose aspirin therapy, 81 mg Monday Wednesday and Friday can be started once hemoglobin has recovered.  Discharge Summary    Patient ID: [name],  MRN: [id number], DOB/AGE: [birth year] 77 y.o.  Admit date: 09/21/2017 Discharge date: 10/17/2017  Primary Care Provider: Wenda Low Primary Cardiologist: Dr. Meda Coffee  Discharge Diagnoses    Principal Problem:   CAD in native artery Active Problems:   Essential hypertension, benign   LBBB (left bundle branch block)   Recurrent lung adenocarcinoma (HCC)   Hyperlipidemia   CKD (chronic kidney disease), stage III (HCC)   Hypertension   CAD (coronary artery disease)   Coronary stent thrombosis   Endotracheal tube present   Closed flail chest   Acute upper GI bleed   Anemia due to acute blood loss   Allergies Allergies  Allergen Reactions  . Meperidine Hcl Other (See Comments)    "knocked pt out for 4 days"  . Penicillins Anaphylaxis, Shortness Of Breath and Other (See Comments)    Difficulty breathing  . Morphine Other (See Comments)    Stopped breathing due to too high of a dose per patient.  Not an allergic reaction to morphine  . Oxycodone-Acetaminophen Itching       . Propoxyphene N-Acetaminophen  Nausea And Vomiting  . Tramadol Hcl Nausea And Vomiting    Diagnostic Studies/Procedures    Cath: 09/21/17  Conclusion    Segmental proximal to mid 90% RCA stenosis.  This lesion was treated with stenting using overlapping Synergy 2.5 mm stents to postdilated to 3 mm in diameter with TIMI grade III flow.  0% stenosis was noted post procedure.  Ostial 50-60% circumflex narrowing.  Luminal irregularities throughout the proximal to mid LAD.  Very distal eccentric tandem 70% stenoses near the apex.  No focally high-grade stenosis is noted.  Normal left main  Low normal LV systolic function in the 33-82% range.  Upper normal left ventricular end-diastolic pressure of 17 mmHg.  RECOMMENDATIONS:   Aspirin and Plavix for at least 6 months in the setting of chronic stable ischemic heart disease.  Given overlapping stents it would be preferable to continue dual antiplatelet therapy for 12 months.  Aggressive risk factor modification using high intensity statin therapy for as long as tolerated.  I have started atorvastatin 80 mg daily and discontinued pravastatin.  Blood pressure control with target 130/80 mmHg or less.  Phase 2 cardiac rehab.  Overnight stay since the patient lives alone, is relatively frail, has had some bleeding from the radial cath site.   Cath: 09/21/17  Conclusion    Acute occlusion of the mid right coronary distal to the previously placed stents from earlier in the day.  Acute occlusion was accompanied by ventricular fibrillation.  The patient was successfully resuscitated.  Successful recanalization of the right coronary and placement of an additional stent overlapping the distal  margin of the previously placed stent.  Final stent diameter 3.25 mm throughout the entire stented segment from distal to proximal.  3.5 mm diameter [place] balloon was used at the very proximal stent margin after intravascular ultrasound demonstrated decreased apposition.  TIMI grade III flow  with no evidence of stenosis postprocedure.  Intravascular ultrasound was used and demonstrated acceptable stent apposition from distal to proximal with the exception of the proximal margin which was underexpanded.  As noted above a 3.5 balloon was used to further expand the stent.  RECOMMENDATION:   Aggrastat for 10 hours.  Continue Plavix.  Critical care medicine to manage the patient's ventilator.  Continue IV Versed and fentanyl for sedation while on the ventilator.  Discussed the situation with family.  Do not anticipate discharge for several days.  IV hydration and tract kidney function.  _____________   History of Present Illness     77 yo female with PMH of LBBB, HTN, probable CKD stage III, carotid ultrasound 2014 showing 1-39% stenosis, hypothyroidism, LBBB, GERD, nonsmall cell lung cancer (s/p RULectomy 2009, radiation to LUL nodule 2013, radiation to lymphadenopathy 2015, Tarceva rx, thermal ablation of recurrent LUL adenocarcinoma 2016), arthritis, cervical CA 1988 s/p surgery who presented to the office on 3/13 for follow up. Has been having episodes of chest pain as an outpatient and recommended that she undergo coronary CT with FFR which was abnormal. Option for cath was recommended to patient who was agreeable to this as outpatient.   Hospital Course     Consultants: GI   Underwent cath with Dr. Tamala Julian noted above with successful PCI/DESx 2 to the [facility]. Placed on DAPT with ASA/plavix post cath. Given her frailty plan was to keep overnight and observe. Later the evening of cath she developed abd pain with nausea. Went into Afib RVR then Vfib arrest. Required 6 mins of CPR until ROSC. She was intubated and brought back to the cath lab. Relook cath with Dr. Tamala Julian noted acute vessel closure with total distal occlusion of the implanted stents with possible edge dissection. She was restented from the distal margin of the previous stents into the distal vessel. Able to wean  extubate the following day. Of note, did have significant chest pain post CPR which was managed with tylenol and lidoderm patches. Developed acute blood loss anemia 2 days post cath with a fall in Hgb to 5, requiring transfusion. She was seen by GI and had an EGD with the finding of gastric ulcers with pooling of blood in the stomach but no active bleeding. Placed on IV protonix. Had decline in Hgb again and underwent repeat EGD with continued ulcers but no active bleeding. Became tachycardia, and placed on metoprolol which was titrated up to 100mg  BID. TSH was normal. Began to work with PT/OT with improvement. Unfortunately, developed melena again and required additional transfusion. GI called back and discussed options with patient. She opted for conservative  Therapy with capsule endoscopy and declined transfer to [facility] for further GI management.  P2Y12 was checked, and decision was made to stop her ASA in the hopes of preventing more melena. Capsule endoscopy showed bright red blood with clot in the mid small bowel with no identified source. Plan was to discuss double balloon enteroscopy if rebleed develops. Did develop recurrent bleeding and required additional transfusion. Iron studies noted as normal. Discussion was had between Dr. Tamala Julian, Dr. Angelena Form, and Dr. Oletta Lamas regarding her care. The patient declined to go for transfer for further GI work up at  [facility]. Hgb did drop again, and the patient made the decision to stop her plavix. The risks were discussed at length with her by Dr. Angelena Form and she understood and accepted these. Her Hgb rose to 10, then declined to 8.9 but remained stable with recurrent melena. She was continued on statin and BB therapy. She continued to work with PT/OT and felt her fatigue was slowing improving. Meet with SW and chose [facility] for SNF at discharge. No s/s of ischemia at day 7 of being off plavix. Noted by Dr. Tamala Julian to consider adding 81mg  ASA back once H/H has  recovered 3x/week.   General: Thin, frail older, female appearing in no acute distress. Head: Normocephalic, atraumatic.  Neck: Supple without bruits, JVD. Lungs:  Resp regular and unlabored, CTA. Heart: RRR, S1, S2, no S3, S4, systolic murmur; no rub. Abdomen: Soft, non-tender, non-distended with normoactive bowel sounds.  Extremities: No clubbing, cyanosis, edema. Distal pedal pulses are 2+ bilaterally. Neuro: Alert and oriented X 3. Moves all extremities spontaneously. Psych: Normal affect.  [name] was seen by Dr. Tamala Julian and determined stable for discharge home. Follow up in the office has been arranged. Medications are listed below.   _____________  Discharge Vitals Blood pressure (!) 161/64, pulse 76, temperature 97.8 F (36.6 C), temperature source Oral, resp. rate (!) 24, height 5' 4.5" (1.638 m), weight 97 lb 7.1 oz (44.2 kg), SpO2 96 %.  Filed Weights   09/30/17 1035 10/06/17 2351 10/10/17 0506  Weight: 137 lb (62.1 kg) 97 lb 10.6 oz (44.3 kg) 97 lb 7.1 oz (44.2 kg)    Labs & Radiologic Studies    CBC Recent Labs    10/15/17 0611 10/16/17 0626  WBC 4.4 4.5  HGB 8.7* 8.9*  HCT 28.6* 29.6*  MCV 93.8 94.0  PLT 280 299   Basic Metabolic Panel Recent Labs    10/15/17 0611 10/16/17 0626  NA 141 140  K 3.9 3.8  CL 108 108  CO2 25 25  GLUCOSE 93 97  BUN 9 8  CREATININE 0.87 0.85  CALCIUM 9.1 9.0   Liver Function Tests No results for input(s): AST, ALT, ALKPHOS, BILITOT, PROT, ALBUMIN in the last 72 hours. No results for input(s): LIPASE, AMYLASE in the last 72 hours. Cardiac Enzymes No results for input(s): CKTOTAL, CKMB, CKMBINDEX, TROPONINI in the last 72 hours. BNP Invalid input(s): POCBNP D-Dimer No results for input(s): DDIMER in the last 72 hours. Hemoglobin A1C No results for input(s): HGBA1C in the last 72 hours. Fasting Lipid Panel No results for input(s): CHOL, HDL, LDLCALC, TRIG, CHOLHDL, LDLDIRECT in the last 72 hours. Thyroid  Function Tests No results for input(s): TSH, T4TOTAL, T3FREE, THYROIDAB in the last 72 hours.  Invalid input(s): FREET3 _____________  [place] 1 View  Result Date: 09/21/2017 CLINICAL DATA:  77 y/o  F; OG tube placement. EXAM: ABDOMEN - 1 VIEW COMPARISON:  03/07/2016 CT abdomen and pelvis FINDINGS: Enteric tube tip projects over gastric body. Transcutaneous pacing pads noted. Upper abdomen bowel gas pattern is normal. IMPRESSION: Enteric tube tip projects over gastric body. Electronically Signed   By: Kristine Garbe M.D.   On: 09/21/2017 22:04   Ct Coronary Morph W/cta Cor W/score W/ca W/cm &/or Wo/cm  Addendum Date: 09/18/2017   ADDENDUM REPORT: 09/18/2017 13:12 CLINICAL DATA:  77 year old female with DOE and h/o NSCLC with h/o radiation to the chest. EXAM: Cardiac/Coronary  CT TECHNIQUE: The patient was scanned on a [employer]. FINDINGS: A 120 kV  prospective scan was triggered in the descending thoracic aorta at 111 HU's. Axial non-contrast 3 mm slices were carried out through the heart. The data set was analyzed on a dedicated work station and scored using the [place]. Gantry rotation speed was 250 msecs and collimation was .6 mm. 10 mg of iv Metoprolol and 0.8 mg of sl NTG was given. The 3D data set was reconstructed in 5% intervals of the 67-82 % of the R-R cycle. Diastolic phases were analyzed on a dedicated work station using MPR, MIP and VRT modes. The patient received 80 cc of contrast. Aorta: Normal size. Moderate diffuse calcifications. No dissection. Aortic Valve:  Trileaflet.  Mild calcifications of the leaflets. Coronary Arteries:  Normal coronary origin.  Right dominance. RCA is a large dominant artery that gives rise to PDA and PLVB. Proximal RCA has a long moderate noncalcified plaque with associated stenosis 50-69% and a focal stenosis > 70%. Mid to distal RCA have mild diffuse calcified plaque with associated stenosis 25-50%. PDA and PLA are small arteries  and have mild diffuse plaque. Left main is a large artery that gives rise to LAD and LCX arteries. Left main is a long artery and has no plaque. LAD is a large vessel that gives rise to one diagonal artery. There is mild diffuse calcified plaque in the proximal artery with associated stenosis 25-50%. Mid LAD has a moderate diffuse mixed plaque with associated stenosis 50-69%. Distal LAD has a focal calcified plaque with associated stenosis 50-69%. Ostial D1 has mild on-calcified plaque with associated stenosis 25-50%. The rest of D1 has mild diffuse plaque. D2 is a small artery and has no significant plaque. LCX is a non-dominant artery that gives rise to one large OM1 branch. There is 50% non-calcified plaque at the ostial LCX artery. Proximal LCX has a mild calcified plaque with stenosis 25-50%. Mid to distal LCX has mild calcified plaque with stenosis 25-50%. OM1 is large and tortuous, there is no significant stenosis. Other findings: Normal pulmonary vein drainage into the left atrium. Normal let atrial appendage without a thrombus. Normal size of the pulmonary artery. IMPRESSION: 1. Coronary calcium score of 744. This was 23 percentile for age and sex matched control. 2. Normal coronary origin with right dominance. 3. Moderate CAD in the proximal RCA with a focal stenosis suspicious for > 70% stenosis. Moderate stenosis in the mid LAD. Mild diffuse CAD elsewhere. Additional analysis with CT FFR will be submitted. Electronically Signed   By: Ena Dawley   On: 09/18/2017 13:12   Result Date: 09/18/2017 EXAM: OVER-READ INTERPRETATION  CT CHEST The following report is an over-read performed by radiologist Dr. Rolm Baptise of [facility], [place] on 09/18/2017. This over-read does not include interpretation of cardiac or coronary anatomy or pathology. The coronary CTA interpretation by the cardiologist is attached. COMPARISON:  09/04/2017 FINDINGS: Vascular: Heart is borderline in size. Visualized aorta is  normal caliber with atherosclerotic calcifications most notable in the descending thoracic aorta. Mediastinum/Nodes: Moderate to large hiatal hernia. No adenopathy in the lower mediastinum or hila. Lungs/Pleura: The left upper lobe central irregular mass is partially imaged on this study, measuring 3.7 cm in greatest diameter on the 1st image. Linear scarring or atelectasis in the lung bases. No effusions. Left lower lobe nodule adjacent to the left heart border measures 9 mm, stable. Upper Abdomen: Imaging into the upper abdomen shows no acute findings. Musculoskeletal: Chest wall soft tissues are unremarkable. IMPRESSION: Aortic atherosclerosis. Central left upper lobe mass partially  imaged. See recent chest CT report for full discussion. Stable left lower lobe pulmonary nodule. Moderate to large hiatal hernia. Electronically Signed: By: Rolm Baptise M.D. On: 09/18/2017 09:41   Dg Chest Port 1 View  Result Date: 09/24/2017 CLINICAL DATA:  Respiratory failure EXAM: PORTABLE CHEST 1 VIEW COMPARISON:  Chest radiograph 09/22/2017 FINDINGS: Monitoring leads overlie the patient. Stable cardiac and mediastinal contours. Aortic atherosclerosis. Re demonstrated left suprahilar masslike opacity. No pleural effusion or pneumothorax. Stable posterior left fourth rib deformity. IMPRESSION: Stable chest radiograph.  Persistent masslike suprahilar opacity. Electronically Signed   By: Lovey Newcomer M.D.   On: 09/24/2017 07:57   Dg Chest Port 1 View  Result Date: 09/22/2017 CLINICAL DATA:  Dyspnea, history of lung cancer EXAM: PORTABLE CHEST 1 VIEW COMPARISON:  09/22/2017 chest radiograph. FINDINGS: Surgical clips overlie the right hilum. Pacer pads overlie the left and lower chest. Stable cardiomediastinal silhouette with top-normal heart size. No pneumothorax. No pleural effusion. Masslike left suprahilar lung opacity is stable. No pulmonary edema. No acute consolidative airspace disease. Stable deformity and sclerosis in  the posterior left fourth rib. IMPRESSION: No interval change.  Stable masslike left suprahilar lung opacity. Electronically Signed   By: Ilona Sorrel M.D.   On: 09/22/2017 20:40   Portable Chest Xray  Result Date: 09/22/2017 CLINICAL DATA:  Cardiac arrest. EXAM: PORTABLE CHEST 1 VIEW COMPARISON:  Chest x-rays dated 09/21/2017 and 06/21/2016 FINDINGS: Endotracheal tube appears in good position. NG tube tip is below the diaphragm. Heart size and vascularity are normal. Aortic atherosclerosis. Stable left lung mass at the superior aspect of the left hilum. The lungs are otherwise clear. IMPRESSION: Stable appearance of the chest. Electronically Signed   By: Lorriane Shire M.D.   On: 09/22/2017 09:01   Portable Chest X-ray  Result Date: 09/21/2017 CLINICAL DATA:  Endotracheal tube placement. EXAM: PORTABLE CHEST 1 VIEW COMPARISON:  CT of the chest performed 09/04/2017 FINDINGS: The patient's endotracheal tube ends 2-3 cm above the carina. The tip of the tube appears to abut the right tracheal wall. This could be rotated slightly leftward. The patient's enteric tube is noted extending below the diaphragm. The patient's left lung masses are again noted. No pleural effusion or pneumothorax is seen. The cardiomediastinal silhouette is normal in size. External pacing pads are noted. No acute osseous abnormalities are identified. IMPRESSION: 1. Endotracheal tube ends 2-3 cm above the carina. Tip of the tube appears to abut the right tracheal wall. This could be rotated slightly leftward. 2. Left lung masses again noted.  Lungs otherwise grossly clear. Electronically Signed   By: Garald Balding M.D.   On: 09/21/2017 22:03   Ct Coronary Fractional Flow Reserve Data Prep  Result Date: 10/03/2017 EXAM: FF/RCT ANALYSIS FINDINGS: FFRct analysis was performed on the original cardiac CT angiogram dataset. Diagrammatic representation of the FFRct analysis is provided in a separate PDF document in PACS. This dictation was  created using the PDF document and an interactive 3D model of the results. 3D model is not available in the EMR/PACS. Normal FFR range is >0.80. 1. Left Main:  No significant stenosis. 2. LAD: Proximal CT FFR: 0.92, mid 0.74, distal: 0.64. 3. LCX: Proximal: 0.94, mid 0.79, distal: 0.70. 4. RCA: Proximal 0.96, mid 0.67. IMPRESSION: 1. CT FFR showed severe stenosis in the proximal to mid RCA, mid LAD and mid LCX arteries. A cardiac catheterization is recommended. Electronically Signed   By: Ena Dawley   On: 09/18/2017 18:23   Ct Coronary Fractional  Flow Reserve Fluid Analysis  Result Date: 10/03/2017 EXAM: FF/RCT ANALYSIS FINDINGS: FFRct analysis was performed on the original cardiac CT angiogram dataset. Diagrammatic representation of the FFRct analysis is provided in a separate PDF document in PACS. This dictation was created using the PDF document and an interactive 3D model of the results. 3D model is not available in the EMR/PACS. Normal FFR range is >0.80. 1. Left Main:  No significant stenosis. 2. LAD: Proximal CT FFR: 0.92, mid 0.74, distal: 0.64. 3. LCX: Proximal: 0.94, mid 0.79, distal: 0.70. 4. RCA: Proximal 0.96, mid 0.67. IMPRESSION: 1. CT FFR showed severe stenosis in the proximal to mid RCA, mid LAD and mid LCX arteries. A cardiac catheterization is recommended. Electronically Signed   By: Ena Dawley   On: 09/18/2017 18:23   Disposition   Pt is being discharged home today in good condition.  Follow-up Plans & Appointments     Contact information for follow-up providers    Laurence Spates, MD. Schedule an appointment as soon as possible for a visit in 6 week(s).   Specialty:  Gastroenterology Why:  Follow-up for GI bleed/duodenal ulcer  Contact information: [address] [address] [phone]        Leanor Kail, PA Follow up on 10/26/2017.   Specialty:  Cardiology Why:  at 11:30am for your follow up appt.  Contact information: [address] [place]  [phone]            Contact information for after-discharge care    Destination    [facility] .   Service:  Skilled Nursing Contact information: [place] [phone]                 Discharge Instructions    Amb Referral to Cardiac Rehabilitation   Complete by:  As directed    Diagnosis:   Coronary Stents PTCA STEMI     Diet - low sodium heart healthy   Complete by:  As directed    Increase activity slowly   Complete by:  As directed       Discharge Medications     Medication List    STOP taking these medications   aspirin EC 81 MG tablet   atenolol 25 MG tablet Commonly known as:  TENORMIN   diltiazem 240 MG 24 hr capsule Commonly known as:  CARDIZEM CD   hydrochlorothiazide 25 MG tablet Commonly known as:  HYDRODIURIL   pravastatin 20 MG tablet Commonly known as:  PRAVACHOL     TAKE these medications   acetaminophen 650 MG CR tablet Commonly known as:  TYLENOL Take 1,300 mg by mouth 2 (two) times daily.   albuterol (2.5 MG/3ML) 0.083% nebulizer solution Commonly known as:  PROVENTIL Take 3 mLs (2.5 mg total) by nebulization every 4 (four) hours as needed for wheezing (use flutter valve).   arformoterol 15 MCG/2ML Nebu Commonly known as:  BROVANA Take 2 mLs (15 mcg total) by nebulization 2 (two) times daily.   atorvastatin 80 MG tablet Commonly known as:  LIPITOR Take 1 tablet (80 mg total) by mouth daily at 6 PM.   budesonide 0.5 MG/2ML nebulizer solution Commonly known as:  PULMICORT Take 2 mLs (0.5 mg total) by nebulization 2 (two) times daily.   CALCIUM 600-D 600-400 MG-UNIT Tabs Generic drug:  Calcium Carbonate-Vitamin D3 Take 2 tablets by mouth daily.   ferrous sulfate 325 (65 FE) MG tablet Take  325 mg by mouth daily with breakfast.   fluticasone 50 MCG/ACT nasal spray Commonly known as:  FLONASE Place 1  spray into both nostrils daily as needed for allergies.   gabapentin 300 MG capsule Commonly known as:  NEURONTIN Take 600 mg by mouth at bedtime.   levothyroxine 100 MCG tablet Commonly known as:  SYNTHROID, LEVOTHROID TAKE 1 TABLET(100 MCG) BY MOUTH DAILY BEFORE BREAKFAST   lidocaine 5 % Commonly known as:  LIDODERM Place 1 patch onto the skin daily. Remove & Discard patch within 12 hours or as directed by MD   losartan 50 MG tablet Commonly known as:  COZAAR Take 1 tablet (50 mg total) by mouth daily.   metoprolol tartrate 100 MG tablet Commonly known as:  LOPRESSOR Take 1 tablet (100 mg total) by mouth 2 (two) times daily.   multivitamin with minerals Tabs tablet Take 1 tablet by mouth daily.   nitroGLYCERIN 0.4 MG SL tablet Commonly known as:  NITROSTAT Place 1 tablet (0.4 mg total) under the tongue every 5 (five) minutes as needed for chest pain.   pantoprazole 40 MG tablet Commonly known as:  PROTONIX Take 1 tablet (40 mg total) by mouth 2 (two) times daily. What changed:  when to take this   PRESERVISION AREDS 2 PO Take 1 capsule by mouth 2 (two) times daily.         Outstanding Labs/Studies   CBC at follow up appt.   Duration of Discharge Encounter   Greater than 30 minutes including physician time.  Signed, Reino Bellis NP-C 10/17/2017, 11:50 AM

## 2017-10-17 NOTE — Progress Notes (Signed)
PT Cancellation Note  Patient Details Name: TRANY CHERNICK MRN: [id number] DOB: 08-23-1940   Cancelled Treatment:    Reason Eval/Treat Not Completed: (P) Patient at procedure or test/unavailable(Pt currently ambulating with cardiac rehab at this time. Will f/u per POC as time permits.  )   Necole Minassian Eli Hose 10/17/2017, 11:10 AM  Governor Rooks, PTA pager [phone]

## 2017-10-18 ENCOUNTER — Telehealth (HOSPITAL_COMMUNITY): Payer: Self-pay

## 2017-10-18 DIAGNOSIS — K922 Gastrointestinal hemorrhage, unspecified: Secondary | ICD-10-CM | POA: Diagnosis not present

## 2017-10-18 DIAGNOSIS — I462 Cardiac arrest due to underlying cardiac condition: Secondary | ICD-10-CM | POA: Diagnosis not present

## 2017-10-18 DIAGNOSIS — J9601 Acute respiratory failure with hypoxia: Secondary | ICD-10-CM | POA: Diagnosis not present

## 2017-10-18 DIAGNOSIS — I2510 Atherosclerotic heart disease of native coronary artery without angina pectoris: Secondary | ICD-10-CM | POA: Diagnosis not present

## 2017-10-18 NOTE — Telephone Encounter (Signed)
Patients insurance is active and benefits verified through Medicare A/B - No co-pay, deductible amount of $185.00/$185.00 has been met, no out of pocket, 20% co-insurance, and no pre-authorization is required. Passport/reference [phone]  Patients insurance is active and benefits verified through [employer] - No co-pay, no deductible, no out of pocket, no co-insurance, and no pre-authorization is required. Passport/reference [phone]  Will contact patient to see if interested in the CR program. If interested, patient will need to complete follow up appt. Once completed, patient will be contacted for scheduling upon review by the RN Navigator.

## 2017-10-19 ENCOUNTER — Telehealth (HOSPITAL_COMMUNITY): Payer: Self-pay

## 2017-10-19 NOTE — Telephone Encounter (Signed)
Attempted to call patient to see if she is interested in the Cardiac Rehab Program - lm on vm

## 2017-10-20 DIAGNOSIS — K922 Gastrointestinal hemorrhage, unspecified: Secondary | ICD-10-CM | POA: Diagnosis not present

## 2017-10-20 DIAGNOSIS — E46 Unspecified protein-calorie malnutrition: Secondary | ICD-10-CM | POA: Diagnosis not present

## 2017-10-20 DIAGNOSIS — I2510 Atherosclerotic heart disease of native coronary artery without angina pectoris: Secondary | ICD-10-CM | POA: Diagnosis not present

## 2017-10-20 DIAGNOSIS — E039 Hypothyroidism, unspecified: Secondary | ICD-10-CM | POA: Diagnosis not present

## 2017-10-20 DIAGNOSIS — I469 Cardiac arrest, cause unspecified: Secondary | ICD-10-CM | POA: Diagnosis not present

## 2017-10-20 DIAGNOSIS — D649 Anemia, unspecified: Secondary | ICD-10-CM | POA: Diagnosis not present

## 2017-10-20 DIAGNOSIS — I4891 Unspecified atrial fibrillation: Secondary | ICD-10-CM | POA: Diagnosis not present

## 2017-10-20 DIAGNOSIS — N183 Chronic kidney disease, stage 3 (moderate): Secondary | ICD-10-CM | POA: Diagnosis not present

## 2017-10-20 DIAGNOSIS — C3490 Malignant neoplasm of unspecified part of unspecified bronchus or lung: Secondary | ICD-10-CM | POA: Diagnosis not present

## 2017-10-25 DIAGNOSIS — K922 Gastrointestinal hemorrhage, unspecified: Secondary | ICD-10-CM | POA: Diagnosis not present

## 2017-10-25 DIAGNOSIS — I2510 Atherosclerotic heart disease of native coronary artery without angina pectoris: Secondary | ICD-10-CM | POA: Diagnosis not present

## 2017-10-25 DIAGNOSIS — J9601 Acute respiratory failure with hypoxia: Secondary | ICD-10-CM | POA: Diagnosis not present

## 2017-10-25 DIAGNOSIS — M150 Primary generalized (osteo)arthritis: Secondary | ICD-10-CM | POA: Diagnosis not present

## 2017-10-25 NOTE — Progress Notes (Addendum)
Cardiology Office Note    Date:  10/26/2017   ID:  [name], DOB 26-Mar-1941, MRN [id number]  PCP:  Wenda Low, MD  Cardiologist:  Dr. Meda Coffee   Chief Complaint: Hospital follow up History of Present Illness:   [name] is a 77 y.o. female with PMH of LBBB,HTN, probable CKD stage III, carotid ultrasound 2014 showing 1-39% stenosis, hypothyroidism, LBBB, GERD,nonsmall cell lung cancer (s/p RULectomy 2009, radiation to LUL nodule 2013, radiation to lymphadenopathy 2015, Tarceva rx, thermal ablation of recurrent LUL adenocarcinoma 2016), arthritis, cervical CA 1988 s/p surgeryand recent CAD presents for follow up   Seen in clinic 3/13 for follow up. Has been having episodes of chest pain as an outpatient and recommended that she undergo coronary CT with FFR which was abnormal.   Underwent cath with Dr. Tamala Julian noted above with successful PCI/DESx 2 to the [facility]. Placed on DAPT with ASA/plavix post cath. Given her frailty plan was to keep overnight and observe. Later the evening of cath she developed abd pain with nausea. Went into Afib RVR then Vfib arrest. Required 6 mins of CPR until ROSC. She was intubated and brought back to the cath lab. Relook cath with Dr. Tamala Julian noted acute vessel closure with total distal occlusion of the implanted stents with possible edge dissection. She was restented from the distal margin of the previous stents into the distal vessel. Able to wean extubate the following day.Hospital course further complicated by the development of melena, hemorrhagic anemia, and multiple transfusions.  Bleeding was related to dual antiplatelet therapy on top of gastric ulcers, colonic ulcers, and multiple small bowel AVM/bleeding lesions. Ultimately all antiplatelet therapy was discontinued, bleeding stopped, and she is being discharged to [facility] for rehab before going home. P2 Y 12 on Plavix monotherapy was less than 10, suggesting  hyper-responsiveness to the medication.  At the patient's request Plavix was discontinued. Once stable and hemoglobin has recovered, it may be wise to consider aspirin 81 mg 3 times per week.  Here today for follow up with son.  She participating in physical therapy daily without angina or dyspnea.  She is gaining her strength daily.  She has hemoglobin checked multiple times at facility.  No labs available for review.  Denies chest pain, shortness of breath, orthopnea, PND, syncope, lower extremity edema or dizziness.  She has noted elevated blood pressure before her evening dose of metoprolol and in the morning.  Past Medical History:  Diagnosis Date  . Anemia 09/2017  . Arthritis    back.and feet  . Cervical ca ([place]) dx'd 1988   surg only  . CKD (chronic kidney disease), stage III ([place])   . Complication of anesthesia    difficulty awakening, dysrhythmia post surgery, (prolonged sedation level)  . Coronary artery calcification seen on CT scan   . Dyspnea   . GERD (gastroesophageal reflux disease)   . Headache    occ. sinus type headaches  . History of hiatal hernia   . Hx of radiation therapy 09/16/13-09/30/13   prevascular lymph node  . Hyperlipidemia   . Hypertension   . Hyperthyroidism    dr Loanne Drilling- radioactive iodine treatment 2011- resolved  . Hypothyroidism   . LBBB (left bundle branch block)   . Lung cancer ([place])    Bilaterally, RUL lobectomy and spot involving both lungs- "cancer"  . Macular degeneration    bilateral  . Melanoma ([place])    left leg  . Radiation 10/11/11-10/21/11  Left upper lobe adenocarcinoma  . Tremor    hand- intention tremor    Past Surgical History:  Procedure Laterality Date  . basal cell and squamous cell skin cell area removed     new left lower eye lid done with skin graft   . CARDIAC CATHETERIZATION  09/21/2017  . CATARACT EXTRACTION, BILATERAL Bilateral   . COLONOSCOPY WITH PROPOFOL N/A 10/27/2016   Procedure: COLONOSCOPY WITH PROPOFOL;   Surgeon: Laurence Spates, MD;  Location: [facility] ENDOSCOPY;  Service: Endoscopy;  Laterality: N/A;  . conization for cervical dysplasia    . CORONARY STENT INTERVENTION N/A 09/21/2017   Procedure: CORONARY STENT INTERVENTION;  Surgeon: Belva Crome, MD;  Location: [place] CV LAB;  Service: Cardiovascular;  Laterality: N/A;  . CORONARY/GRAFT ACUTE MI REVASCULARIZATION N/A 09/21/2017   Procedure: Coronary/Graft Acute MI Revascularization;  Surgeon: Belva Crome, MD;  Location: [place] CV LAB;  Service: Cardiovascular;  Laterality: N/A;  . ESOPHAGOGASTRODUODENOSCOPY (EGD) WITH PROPOFOL N/A 09/28/2017   Procedure: ESOPHAGOGASTRODUODENOSCOPY (EGD) WITH PROPOFOL;  Surgeon: Arta Silence, MD;  Location: [place];  Service: Endoscopy;  Laterality: N/A;  . ESOPHAGOGASTRODUODENOSCOPY (EGD) WITH PROPOFOL N/A 09/30/2017   Procedure: ESOPHAGOGASTRODUODENOSCOPY (EGD) WITH PROPOFOL;  Surgeon: Otis Brace, MD;  Location: [facility] ENDOSCOPY;  Service: Gastroenterology;  Laterality: N/A;  . esophogus stretching    . GIVENS CAPSULE STUDY N/A 10/06/2017   Procedure: GIVENS CAPSULE STUDY;  Surgeon: Arta Silence, MD;  Location: [facility] ENDOSCOPY;  Service: Endoscopy;  Laterality: N/A;  . INTRAVASCULAR ULTRASOUND/IVUS N/A 09/21/2017   Procedure: Intravascular Ultrasound/IVUS;  Surgeon: Belva Crome, MD;  Location: [place] CV LAB;  Service: Cardiovascular;  Laterality: N/A;  . IR GENERIC HISTORICAL  09/11/2014   IR RADIOLOGIST EVAL & MGMT 09/11/2014 Jacqulynn Cadet, MD [facility] INTERV RAD  . LEFT HEART CATH AND CORONARY ANGIOGRAPHY N/A 09/21/2017   Procedure: LEFT HEART CATH AND CORONARY ANGIOGRAPHY;  Surgeon: Belva Crome, MD;  Location: [employer] CV LAB;  Service: Cardiovascular;  Laterality: N/A;  . LEFT HEART CATH AND CORONARY ANGIOGRAPHY N/A 09/21/2017   Procedure: LEFT HEART CATH AND CORONARY ANGIOGRAPHY;  Surgeon: Belva Crome, MD;  Location: [place] CV LAB;  Service: Cardiovascular;  Laterality: N/A;   . radio frequency ablation on lung spot    . rul lobectomy  2010  . TONSILLECTOMY    . TONSILLECTOMY      Current Medications: Prior to Admission medications   Medication Sig Start Date End Date Taking? Authorizing Provider  acetaminophen (TYLENOL) 650 MG CR tablet Take 1,300 mg by mouth 2 (two) times daily.     [provider]  albuterol (PROVENTIL) (2.5 MG/3ML) 0.083% nebulizer solution Take 3 mLs (2.5 mg total) by nebulization every 4 (four) hours as needed for wheezing (use flutter valve). 10/17/17   Cheryln Manly, NP  arformoterol (BROVANA) 15 MCG/2ML NEBU Take 2 mLs (15 mcg total) by nebulization 2 (two) times daily. 10/17/17   Cheryln Manly, NP  atorvastatin (LIPITOR) 80 MG tablet Take 1 tablet (80 mg total) by mouth daily at 6 PM. 10/17/17   Cheryln Manly, NP  budesonide (PULMICORT) 0.5 MG/2ML nebulizer solution Take 2 mLs (0.5 mg total) by nebulization 2 (two) times daily. 10/17/17   Cheryln Manly, NP  Calcium Carbonate-Vitamin D3 (CALCIUM 600-D) 600-400 MG-UNIT TABS Take 2 tablets by mouth daily.     [provider]  ferrous sulfate 325 (65 FE) MG tablet Take 325 mg by mouth daily with breakfast.  [provider]  fluticasone (FLONASE) 50 MCG/ACT nasal spray Place 1 spray into both nostrils daily as needed for allergies.     [provider]  gabapentin (NEURONTIN) 300 MG capsule Take 600 mg by mouth at bedtime.  07/31/14   [provider]  levothyroxine (SYNTHROID, LEVOTHROID) 100 MCG tablet TAKE 1 TABLET(100 MCG) BY MOUTH DAILY BEFORE BREAKFAST 09/13/17   Dorothy Spark, MD  lidocaine (LIDODERM) 5 % Place 1 patch onto the skin daily. Remove & Discard patch within 12 hours or as directed by MD 10/17/17   Cheryln Manly, NP  losartan (COZAAR) 50 MG tablet Take 1 tablet (50 mg total) by mouth daily. 10/17/17   Cheryln Manly, NP  metoprolol tartrate (LOPRESSOR) 100 MG tablet Take 1 tablet (100 mg total) by mouth 2 (two)  times daily. 10/17/17   Cheryln Manly, NP  Multiple Vitamin (MULTIVITAMIN WITH MINERALS) TABS tablet Take 1 tablet by mouth daily.    [provider]  Multiple Vitamins-Minerals (PRESERVISION AREDS 2 PO) Take 1 capsule by mouth 2 (two) times daily.    [provider]  nitroGLYCERIN (NITROSTAT) 0.4 MG SL tablet Place 1 tablet (0.4 mg total) under the tongue every 5 (five) minutes as needed for chest pain. 10/17/17   Cheryln Manly, NP  pantoprazole (PROTONIX) 40 MG tablet Take 1 tablet (40 mg total) by mouth 2 (two) times daily. 10/17/17   Cheryln Manly, NP    Allergies:   Meperidine hcl; Penicillins; Morphine; Oxycodone-acetaminophen; Propoxyphene n-acetaminophen; and Tramadol hcl   Social History   Socioeconomic History  . Marital status: Widowed    Spouse name: Not on file  . Number of children: 2  . Years of education: Not on file  . Highest education level: Not on file  Occupational History  . Occupation: homemaker    Employer: RETIRED  Social Needs  . Financial resource strain: Not on file  . Food insecurity:    Worry: Not on file    Inability: Not on file  . Transportation needs:    Medical: Not on file    Non-medical: Not on file  Tobacco Use  . Smoking status: Former Smoker    Packs/day: 1.00    Years: 17.00    Pack years: 17.00    Types: Cigarettes    Last attempt to quit: 09/28/1975    Years since quitting: 42.1  . Smokeless tobacco: Never Used  . Tobacco comment: 33 yrs ago  Substance and Sexual Activity  . Alcohol use: Yes    Comment: glass wine daily  . Drug use: No  . Sexual activity: Not on file  Lifestyle  . Physical activity:    Days per week: Not on file    Minutes per session: Not on file  . Stress: Not on file  Relationships  . Social connections:    Talks on phone: Not on file    Gets together: Not on file    Attends religious service: Not on file    Active member of club or organization: Not on file    Attends  meetings of clubs or organizations: Not on file    Relationship status: Not on file  Other Topics Concern  . Not on file  Social History Narrative   [employer] graduate   Married 1963   4 grandchildren and 2 step-grandchildren   Marriage in good health            Physician Roster:   Materials engineer-  Dr Julien Nordmann   Surgeon- Dr Lindell Spar- Dr Mable Fill- Dr Allyson Sabal     Family History:  The patient's family history includes Asthma in her mother; Cancer in her maternal grandfather and maternal uncle; Heart disease in her mother; Thyroid disease in her daughter.   ROS:   Please see the history of present illness.    ROS All other systems reviewed and are negative.   PHYSICAL EXAM:   VS:  BP (!) 162/78   Pulse 69   Ht 5\' 3"  (1.6 m)   Wt 123 lb 6.4 oz (56 kg)   SpO2 95%   BMI 21.86 kg/m    GEN: Well nourished, well developed, in no acute distress  HEENT: normal  Neck: no JVD, carotid bruits, or masses Cardiac:RRR; no murmurs, rubs, or gallops,no edema  Respiratory:  clear to auscultation bilaterally, normal work of breathing GI: soft, nontender, nondistended, + BS MS: no deformity or atrophy  Skin: warm and dry, no rash.  Scattered ecchymosis Neuro:  Alert and Oriented x 3, Strength and sensation are intact Psych: euthymic mood, full affect  Wt Readings from Last 3 Encounters:  10/26/17 123 lb 6.4 oz (56 kg)  10/10/17 97 lb 7.1 oz (44.2 kg)  09/20/17 140 lb 8 oz (63.7 kg)      Studies/Labs Reviewed:   EKG:  EKG is ordered today and showed NSR with LBBB.  Recent Labs: 09/04/2017: ALT 11 09/24/2017: Magnesium 2.2 10/02/2017: TSH 1.768 10/04/2017: B Natriuretic Peptide 115.8 10/16/2017: BUN 8; Creatinine, Ser 0.85; Hemoglobin 8.9; Platelets 281; Potassium 3.8; Sodium 140   Lipid Panel    Component Value Date/Time   CHOL 193 08/22/2017 1221   TRIG 135 08/22/2017 1221   HDL 53 08/22/2017 1221   CHOLHDL 3.6 08/22/2017 1221   CHOLHDL 4.5 04/12/2016 0841   VLDL 39 (H)  04/12/2016 0841   LDLCALC 113 (H) 08/22/2017 1221   LDLDIRECT 137.6 05/17/2013 0937    Additional studies/ records that were reviewed today include:   Cath: 09/21/17 Conclusion    Segmental proximal to mid 90% RCA stenosis. This lesion was treated with stenting using overlapping Synergy 2.5 mm stents to postdilated to 3 mm in diameter with TIMI grade III flow. 0% stenosis was noted post procedure.  Ostial 50-60% circumflex narrowing.  Luminal irregularities throughout the proximal to mid LAD. Very distal eccentric tandem 70% stenoses near the apex. No focally high-grade stenosis is noted.  Normal left main  Low normal LV systolic function in the 95-09% range. Upper normal left ventricular end-diastolic pressure of 17 mmHg.  RECOMMENDATIONS:   Aspirin and Plavix for at least 6 months in the setting of chronic stable ischemic heart disease. Given overlapping stents it would be preferable to continue dual antiplatelet therapy for 12 months.  Aggressive risk factor modification using high intensity statin therapy for as long as tolerated. I have started atorvastatin 80 mg daily and discontinued pravastatin. Blood pressure control with target 130/80 mmHg or less.  Phase 2 cardiac rehab.  Overnight stay since the patient lives alone, is relatively frail, has had some bleeding from the radial cath site.   Cath: 09/21/17 Conclusion    Acute occlusion of the mid right coronary distal to the previously placed stents from earlier in the day. Acute occlusion was accompanied by ventricular fibrillation. The patient was successfully resuscitated.  Successful recanalization of the right coronary and placement of an additional stent overlapping the distal margin of the previously placed stent. Final  stent diameter 3.25 mm throughout the entire stented segment from distal to proximal. 3.5 mm diameter Schaller balloon was used at the very proximal stent margin after intravascular  ultrasound demonstrated decreased apposition. TIMI grade III flow with no evidence of stenosis postprocedure.  Intravascular ultrasound was used and demonstrated acceptable stent apposition from distal to proximal with the exception of the proximal margin which was underexpanded. As noted above a 3.5 balloon was used to further expand the stent.  RECOMMENDATION:   Aggrastat for 10 hours.  Continue Plavix.  Critical care medicine to manage the patient's ventilator.  Continue IV Versed and fentanyl for sedation while on the ventilator.  Discussed the situation with family. Do not anticipate discharge for several days.  IV hydration and tract kidney function.  _____________   ASSESSMENT & PLAN:    1. CAD - S/p PCI/DESx 2 to the [facility] however developed acute occlusion of the mid right coronary distal to the previously placed stents from earlier in the day. Acute occlusion was accompanied by ventricular fibrillation. The patient was successfully resuscitated and underwent successful recanalization of the right coronary and placement of an additional stent overlapping the distal margin of the previously placed stent.  - Now off DAPT due to  Recurrent melena, hemorrhagic anemia, and multiple transfusions - P2 Y 12 on Plavix monotherapy was less than 10, suggesting hyper-responsiveness to the medication.  - Plan to consider aspirin 81 mg 3 times per week per Dr. Tamala Julian.  Will review during follow-up.  She wants to wait a few more weeks until she gains her strength and healing of ulcer.  2. Melena and anemia -Managed at facility. Hgb of 8.9 at discharge. Follow up with GI as schedule.   3. HLD - 08/22/2017: Cholesterol, Total 193; HDL 53; LDL Calculated 113; Triglycerides 135  - Continue statin. Check lipid panel and LFT during follow up.    4. HTN - Elevated. Continue lopressor 100mg  BID. Will increase Losartan to 50mg  BID. Check BMET in 1 week at facility. Keep log of blood  pressure and adjust accordingly.      Medication Adjustments/Labs and Tests Ordered: Current medicines are reviewed at length with the patient today.  Concerns regarding medicines are outlined above.  Medication changes, Labs and Tests ordered today are listed in the Patient Instructions below. Patient Instructions  Medication Instructions:  1) INCREASE Losartan to 50mg  twice daily  Labwork: None  Testing/Procedures: None  Follow-Up: Your physician recommends that you schedule a follow-up appointment in: 3-4 weeks with Dr. Meda Coffee or    Any Other Special Instructions Will Be Listed Below (If Applicable).     If you need a refill on your cardiac medications before your next appointment, please call your pharmacy.      Jarrett Soho, [place]  10/26/2017 12:20 PM    [facility] [place], [place], [place]  [address] Phone: [phone]; Fax: [phone]

## 2017-10-26 ENCOUNTER — Encounter: Payer: Self-pay | Admitting: Physician Assistant

## 2017-10-26 ENCOUNTER — Ambulatory Visit (INDEPENDENT_AMBULATORY_CARE_PROVIDER_SITE_OTHER): Payer: Medicare Other | Admitting: Physician Assistant

## 2017-10-26 VITALS — BP 162/78 | HR 69 | Ht 63.0 in | Wt 123.4 lb

## 2017-10-26 DIAGNOSIS — E785 Hyperlipidemia, unspecified: Secondary | ICD-10-CM | POA: Diagnosis not present

## 2017-10-26 DIAGNOSIS — R0989 Other specified symptoms and signs involving the circulatory and respiratory systems: Secondary | ICD-10-CM | POA: Diagnosis not present

## 2017-10-26 DIAGNOSIS — I2510 Atherosclerotic heart disease of native coronary artery without angina pectoris: Secondary | ICD-10-CM

## 2017-10-26 DIAGNOSIS — I10 Essential (primary) hypertension: Secondary | ICD-10-CM

## 2017-10-26 MED ORDER — LOSARTAN POTASSIUM 50 MG PO TABS: 50.0000 mg | ORAL_TABLET | Freq: Two times a day (BID) | ORAL | 3 refills | Status: DC

## 2017-10-26 NOTE — Patient Instructions (Addendum)
Medication Instructions:  1) INCREASE Losartan to 50mg  twice daily  Labwork: None  Testing/Procedures: None  Follow-Up: Your physician recommends that you schedule a follow-up appointment in: 3-4 weeks with Dr. Meda Coffee or APP (preferably with Vin on a day Meda Coffee is in the office is possible.)   Any Other Special Instructions Will Be Listed Below (If Applicable).     If you need a refill on your cardiac medications before your next appointment, please call your pharmacy.

## 2017-10-27 ENCOUNTER — Telehealth (HOSPITAL_COMMUNITY): Payer: Self-pay

## 2017-10-27 NOTE — Telephone Encounter (Signed)
Called son of patient to follow up on patient - He stated patient is doing well and is progressing. He stated that d/c is tentatively next week or the following week. Will follow up with Son after follow up appt on 11/24/17.

## 2017-11-03 DIAGNOSIS — R197 Diarrhea, unspecified: Secondary | ICD-10-CM | POA: Diagnosis not present

## 2017-11-03 DIAGNOSIS — R110 Nausea: Secondary | ICD-10-CM | POA: Diagnosis not present

## 2017-11-08 DIAGNOSIS — I462 Cardiac arrest due to underlying cardiac condition: Secondary | ICD-10-CM | POA: Diagnosis not present

## 2017-11-08 DIAGNOSIS — I2510 Atherosclerotic heart disease of native coronary artery without angina pectoris: Secondary | ICD-10-CM | POA: Diagnosis not present

## 2017-11-08 DIAGNOSIS — R531 Weakness: Secondary | ICD-10-CM | POA: Diagnosis not present

## 2017-11-08 DIAGNOSIS — I10 Essential (primary) hypertension: Secondary | ICD-10-CM | POA: Diagnosis not present

## 2017-11-08 DIAGNOSIS — K922 Gastrointestinal hemorrhage, unspecified: Secondary | ICD-10-CM | POA: Diagnosis not present

## 2017-11-08 DIAGNOSIS — N183 Chronic kidney disease, stage 3 (moderate): Secondary | ICD-10-CM | POA: Diagnosis not present

## 2017-11-09 DIAGNOSIS — K219 Gastro-esophageal reflux disease without esophagitis: Secondary | ICD-10-CM | POA: Diagnosis not present

## 2017-11-09 DIAGNOSIS — E785 Hyperlipidemia, unspecified: Secondary | ICD-10-CM | POA: Diagnosis not present

## 2017-11-09 DIAGNOSIS — I447 Left bundle-branch block, unspecified: Secondary | ICD-10-CM | POA: Diagnosis not present

## 2017-11-09 DIAGNOSIS — I509 Heart failure, unspecified: Secondary | ICD-10-CM | POA: Diagnosis not present

## 2017-11-09 DIAGNOSIS — N183 Chronic kidney disease, stage 3 (moderate): Secondary | ICD-10-CM | POA: Diagnosis not present

## 2017-11-09 DIAGNOSIS — I2510 Atherosclerotic heart disease of native coronary artery without angina pectoris: Secondary | ICD-10-CM | POA: Diagnosis not present

## 2017-11-09 DIAGNOSIS — I130 Hypertensive heart and chronic kidney disease with heart failure and stage 1 through stage 4 chronic kidney disease, or unspecified chronic kidney disease: Secondary | ICD-10-CM | POA: Diagnosis not present

## 2017-11-09 DIAGNOSIS — E039 Hypothyroidism, unspecified: Secondary | ICD-10-CM | POA: Diagnosis not present

## 2017-11-09 DIAGNOSIS — D631 Anemia in chronic kidney disease: Secondary | ICD-10-CM | POA: Diagnosis not present

## 2017-11-09 DIAGNOSIS — Z7951 Long term (current) use of inhaled steroids: Secondary | ICD-10-CM | POA: Diagnosis not present

## 2017-11-09 DIAGNOSIS — H3530 Unspecified macular degeneration: Secondary | ICD-10-CM | POA: Diagnosis not present

## 2017-11-09 DIAGNOSIS — Z955 Presence of coronary angioplasty implant and graft: Secondary | ICD-10-CM | POA: Diagnosis not present

## 2017-11-09 DIAGNOSIS — Z85118 Personal history of other malignant neoplasm of bronchus and lung: Secondary | ICD-10-CM | POA: Diagnosis not present

## 2017-11-09 DIAGNOSIS — M150 Primary generalized (osteo)arthritis: Secondary | ICD-10-CM | POA: Diagnosis not present

## 2017-11-09 DIAGNOSIS — I4901 Ventricular fibrillation: Secondary | ICD-10-CM | POA: Diagnosis not present

## 2017-11-13 DIAGNOSIS — M150 Primary generalized (osteo)arthritis: Secondary | ICD-10-CM | POA: Diagnosis not present

## 2017-11-13 DIAGNOSIS — I130 Hypertensive heart and chronic kidney disease with heart failure and stage 1 through stage 4 chronic kidney disease, or unspecified chronic kidney disease: Secondary | ICD-10-CM | POA: Diagnosis not present

## 2017-11-13 DIAGNOSIS — I2510 Atherosclerotic heart disease of native coronary artery without angina pectoris: Secondary | ICD-10-CM | POA: Diagnosis not present

## 2017-11-13 DIAGNOSIS — I447 Left bundle-branch block, unspecified: Secondary | ICD-10-CM | POA: Diagnosis not present

## 2017-11-13 DIAGNOSIS — N183 Chronic kidney disease, stage 3 (moderate): Secondary | ICD-10-CM | POA: Diagnosis not present

## 2017-11-13 DIAGNOSIS — I509 Heart failure, unspecified: Secondary | ICD-10-CM | POA: Diagnosis not present

## 2017-11-15 DIAGNOSIS — N183 Chronic kidney disease, stage 3 (moderate): Secondary | ICD-10-CM | POA: Diagnosis not present

## 2017-11-15 DIAGNOSIS — M150 Primary generalized (osteo)arthritis: Secondary | ICD-10-CM | POA: Diagnosis not present

## 2017-11-15 DIAGNOSIS — I130 Hypertensive heart and chronic kidney disease with heart failure and stage 1 through stage 4 chronic kidney disease, or unspecified chronic kidney disease: Secondary | ICD-10-CM | POA: Diagnosis not present

## 2017-11-15 DIAGNOSIS — I2510 Atherosclerotic heart disease of native coronary artery without angina pectoris: Secondary | ICD-10-CM | POA: Diagnosis not present

## 2017-11-15 DIAGNOSIS — I447 Left bundle-branch block, unspecified: Secondary | ICD-10-CM | POA: Diagnosis not present

## 2017-11-15 DIAGNOSIS — I509 Heart failure, unspecified: Secondary | ICD-10-CM | POA: Diagnosis not present

## 2017-11-16 DIAGNOSIS — I2510 Atherosclerotic heart disease of native coronary artery without angina pectoris: Secondary | ICD-10-CM | POA: Diagnosis not present

## 2017-11-16 DIAGNOSIS — I447 Left bundle-branch block, unspecified: Secondary | ICD-10-CM | POA: Diagnosis not present

## 2017-11-16 DIAGNOSIS — I130 Hypertensive heart and chronic kidney disease with heart failure and stage 1 through stage 4 chronic kidney disease, or unspecified chronic kidney disease: Secondary | ICD-10-CM | POA: Diagnosis not present

## 2017-11-16 DIAGNOSIS — N183 Chronic kidney disease, stage 3 (moderate): Secondary | ICD-10-CM | POA: Diagnosis not present

## 2017-11-16 DIAGNOSIS — I509 Heart failure, unspecified: Secondary | ICD-10-CM | POA: Diagnosis not present

## 2017-11-16 DIAGNOSIS — M150 Primary generalized (osteo)arthritis: Secondary | ICD-10-CM | POA: Diagnosis not present

## 2017-11-17 DIAGNOSIS — M150 Primary generalized (osteo)arthritis: Secondary | ICD-10-CM | POA: Diagnosis not present

## 2017-11-17 DIAGNOSIS — I2510 Atherosclerotic heart disease of native coronary artery without angina pectoris: Secondary | ICD-10-CM | POA: Diagnosis not present

## 2017-11-17 DIAGNOSIS — I509 Heart failure, unspecified: Secondary | ICD-10-CM | POA: Diagnosis not present

## 2017-11-17 DIAGNOSIS — I447 Left bundle-branch block, unspecified: Secondary | ICD-10-CM | POA: Diagnosis not present

## 2017-11-17 DIAGNOSIS — N183 Chronic kidney disease, stage 3 (moderate): Secondary | ICD-10-CM | POA: Diagnosis not present

## 2017-11-17 DIAGNOSIS — I130 Hypertensive heart and chronic kidney disease with heart failure and stage 1 through stage 4 chronic kidney disease, or unspecified chronic kidney disease: Secondary | ICD-10-CM | POA: Diagnosis not present

## 2017-11-18 ENCOUNTER — Other Ambulatory Visit: Payer: Self-pay | Admitting: Cardiology

## 2017-11-20 DIAGNOSIS — M150 Primary generalized (osteo)arthritis: Secondary | ICD-10-CM | POA: Diagnosis not present

## 2017-11-20 DIAGNOSIS — N183 Chronic kidney disease, stage 3 (moderate): Secondary | ICD-10-CM | POA: Diagnosis not present

## 2017-11-20 DIAGNOSIS — I130 Hypertensive heart and chronic kidney disease with heart failure and stage 1 through stage 4 chronic kidney disease, or unspecified chronic kidney disease: Secondary | ICD-10-CM | POA: Diagnosis not present

## 2017-11-20 DIAGNOSIS — I509 Heart failure, unspecified: Secondary | ICD-10-CM | POA: Diagnosis not present

## 2017-11-20 DIAGNOSIS — I447 Left bundle-branch block, unspecified: Secondary | ICD-10-CM | POA: Diagnosis not present

## 2017-11-20 DIAGNOSIS — I2510 Atherosclerotic heart disease of native coronary artery without angina pectoris: Secondary | ICD-10-CM | POA: Diagnosis not present

## 2017-11-21 DIAGNOSIS — M150 Primary generalized (osteo)arthritis: Secondary | ICD-10-CM | POA: Diagnosis not present

## 2017-11-21 DIAGNOSIS — I130 Hypertensive heart and chronic kidney disease with heart failure and stage 1 through stage 4 chronic kidney disease, or unspecified chronic kidney disease: Secondary | ICD-10-CM | POA: Diagnosis not present

## 2017-11-21 DIAGNOSIS — N183 Chronic kidney disease, stage 3 (moderate): Secondary | ICD-10-CM | POA: Diagnosis not present

## 2017-11-21 DIAGNOSIS — I509 Heart failure, unspecified: Secondary | ICD-10-CM | POA: Diagnosis not present

## 2017-11-21 DIAGNOSIS — I447 Left bundle-branch block, unspecified: Secondary | ICD-10-CM | POA: Diagnosis not present

## 2017-11-21 DIAGNOSIS — I2510 Atherosclerotic heart disease of native coronary artery without angina pectoris: Secondary | ICD-10-CM | POA: Diagnosis not present

## 2017-11-22 DIAGNOSIS — M150 Primary generalized (osteo)arthritis: Secondary | ICD-10-CM | POA: Diagnosis not present

## 2017-11-22 DIAGNOSIS — I447 Left bundle-branch block, unspecified: Secondary | ICD-10-CM | POA: Diagnosis not present

## 2017-11-22 DIAGNOSIS — I130 Hypertensive heart and chronic kidney disease with heart failure and stage 1 through stage 4 chronic kidney disease, or unspecified chronic kidney disease: Secondary | ICD-10-CM | POA: Diagnosis not present

## 2017-11-22 DIAGNOSIS — I509 Heart failure, unspecified: Secondary | ICD-10-CM | POA: Diagnosis not present

## 2017-11-22 DIAGNOSIS — I2510 Atherosclerotic heart disease of native coronary artery without angina pectoris: Secondary | ICD-10-CM | POA: Diagnosis not present

## 2017-11-22 DIAGNOSIS — N183 Chronic kidney disease, stage 3 (moderate): Secondary | ICD-10-CM | POA: Diagnosis not present

## 2017-11-24 ENCOUNTER — Ambulatory Visit (INDEPENDENT_AMBULATORY_CARE_PROVIDER_SITE_OTHER): Payer: Medicare Other | Admitting: Physician Assistant

## 2017-11-24 ENCOUNTER — Encounter: Payer: Self-pay | Admitting: Physician Assistant

## 2017-11-24 DIAGNOSIS — I700 Atherosclerosis of aorta: Secondary | ICD-10-CM | POA: Diagnosis not present

## 2017-11-24 DIAGNOSIS — R0989 Other specified symptoms and signs involving the circulatory and respiratory systems: Secondary | ICD-10-CM | POA: Diagnosis not present

## 2017-11-24 DIAGNOSIS — I10 Essential (primary) hypertension: Secondary | ICD-10-CM | POA: Diagnosis not present

## 2017-11-24 DIAGNOSIS — E782 Mixed hyperlipidemia: Secondary | ICD-10-CM | POA: Diagnosis not present

## 2017-11-24 DIAGNOSIS — E785 Hyperlipidemia, unspecified: Secondary | ICD-10-CM | POA: Diagnosis not present

## 2017-11-24 DIAGNOSIS — R0609 Other forms of dyspnea: Secondary | ICD-10-CM

## 2017-11-24 DIAGNOSIS — I2510 Atherosclerotic heart disease of native coronary artery without angina pectoris: Secondary | ICD-10-CM | POA: Diagnosis not present

## 2017-11-24 MED ORDER — ATORVASTATIN CALCIUM 80 MG PO TABS: 80.0000 mg | ORAL_TABLET | Freq: Every day | ORAL | 3 refills | Status: DC

## 2017-11-24 NOTE — Progress Notes (Signed)
Cardiology Office Note    Date:  11/24/2017   ID:  GEORGIANNE GRITZ, DOB [birth year], MRN [id number]  PCP:  Wenda Low, MD  Cardiologist:  Dr. Meda Coffee   Chief Complaint: 4 weeks follow up  History of Present Illness:   OTA EBERSOLE is a 77 y.o. female PMH ofLBBB,HTN, probable CKD stage III, carotid ultrasound 2014 showing 1-39% stenosis, hypothyroidism, LBBB, GERD,nonsmall cell lung cancer (s/p RULectomy 2009, radiation to LUL nodule 2013, radiation to lymphadenopathy 2015, Tarceva rx, thermal ablation of recurrent LUL adenocarcinoma 2016), arthritis, cervical CA 1988 s/p surgeryand recent CAD presents for follow up.   Noted chest pain recently. Follow up CT with FFR was abnormal. Cath 09/21/17 showed segmental proximal to mid 90% RCA stenosis s/p overlapping Synergy 2.5 mm stents. Hospital course complicated by Afib RVR then Vfib arrest. Required 6 mins of CPR until ROSC. She was intubated and brought back to the cath lab. Relook cath with Dr. Tamala Julian noted acute vessel closure with total distal occlusion of the implanted stents with possible edge dissection. She was restented from the distal margin of the previous stents into the distal vessel. Able to wean extubate the following day.Hospital course further complicated by the development of melena, hemorrhagic anemia, and multiple transfusions. Bleeding was related to dual antiplatelet therapy on top of gastric ulcers, colonic ulcers, and multiple small bowel AVM/bleeding lesions. Ultimately all antiplatelet therapy was discontinued, bleeding stopped, and she is being discharged to [facility] for rehab before going home. P2 Y 12 on Plavix monotherapy was less than10,suggestinghyper-responsiveness to the medication. At the patient's request Plavix was discontinued. Once stable and hemoglobin has recovered, it may be wise to consider aspirin 81 mg 3 times per week.  She was doing well at rehab facility when seen  by me 10/26/17. Increased losartan to 50mg  BID.   Here today for discussion of antiplatelet therapy.  Patient has discussed with Dr. Tamala Julian regarding starting aspirin 3 times per week.  Patient is still thinking about that.  She will discuss with her primary care provider during office visit in few weeks.  She is also wanting to start her pain medication.  She continues to have dyspnea on exertion however stable since discharge.  Her fatigue has been improving.  She walks approximately 10 minutes multiple times per day without chest pain.  Compliant with medication.  Her blood pressure runs higher in the morning prior to her medication.   Past Medical History:  Diagnosis Date  . Anemia 09/2017  . Arthritis    back.and feet  . Cervical ca ([place]) dx'd 1988   surg only  . CKD (chronic kidney disease), stage III ([place])   . Complication of anesthesia    difficulty awakening, dysrhythmia post surgery, (prolonged sedation level)  . Coronary artery calcification seen on CT scan   . Dyspnea   . GERD (gastroesophageal reflux disease)   . Headache    occ. sinus type headaches  . History of hiatal hernia   . Hx of radiation therapy 09/16/13-09/30/13   prevascular lymph node  . Hyperlipidemia   . Hypertension   . Hyperthyroidism    dr Loanne Drilling- radioactive iodine treatment 2011- resolved  . Hypothyroidism   . LBBB (left bundle branch block)   . Lung cancer ([place])    Bilaterally, RUL lobectomy and spot involving both lungs- "cancer"  . Macular degeneration    bilateral  . Melanoma ([place])    left leg  . Radiation 10/11/11-10/21/11   Left  upper lobe adenocarcinoma  . Tremor    hand- intention tremor    Past Surgical History:  Procedure Laterality Date  . basal cell and squamous cell skin cell area removed     new left lower eye lid done with skin graft   . CARDIAC CATHETERIZATION  09/21/2017  . CATARACT EXTRACTION, BILATERAL Bilateral   . COLONOSCOPY WITH PROPOFOL N/A 10/27/2016   Procedure:  COLONOSCOPY WITH PROPOFOL;  Surgeon: Laurence Spates, MD;  Location: [facility];  Service: Endoscopy;  Laterality: N/A;  . conization for cervical dysplasia    . CORONARY STENT INTERVENTION N/A 09/21/2017   Procedure: CORONARY STENT INTERVENTION;  Surgeon: Belva Crome, MD;  Location: [place];  Service: Cardiovascular;  Laterality: N/A;  . CORONARY/GRAFT ACUTE MI REVASCULARIZATION N/A 09/21/2017   Procedure: Coronary/Graft Acute MI Revascularization;  Surgeon: Belva Crome, MD;  Location: [place] [facility];  Service: Cardiovascular;  Laterality: N/A;  . ESOPHAGOGASTRODUODENOSCOPY (EGD) WITH PROPOFOL N/A 09/28/2017   Procedure: ESOPHAGOGASTRODUODENOSCOPY (EGD) WITH PROPOFOL;  Surgeon: Arta Silence, MD;  Location: [place];  Service: Endoscopy;  Laterality: N/A;  . ESOPHAGOGASTRODUODENOSCOPY (EGD) WITH PROPOFOL N/A 09/30/2017   Procedure: ESOPHAGOGASTRODUODENOSCOPY (EGD) WITH PROPOFOL;  Surgeon: Otis Brace, MD;  Location: [facility];  Service: Gastroenterology;  Laterality: N/A;  . esophogus stretching    . GIVENS CAPSULE STUDY N/A 10/06/2017   Procedure: GIVENS CAPSULE STUDY;  Surgeon: Arta Silence, MD;  Location: [facility] ENDOSCOPY;  Service: Endoscopy;  Laterality: N/A;  . INTRAVASCULAR ULTRASOUND/IVUS N/A 09/21/2017   Procedure: Intravascular Ultrasound/IVUS;  Surgeon: Belva Crome, MD;  Location: [place] [facility];  Service: Cardiovascular;  Laterality: N/A;  . IR GENERIC HISTORICAL  09/11/2014   IR RADIOLOGIST EVAL & MGMT 09/11/2014 Jacqulynn Cadet, MD [facility] INTERV RAD  . LEFT HEART CATH AND CORONARY ANGIOGRAPHY N/A 09/21/2017   Procedure: LEFT HEART CATH AND CORONARY ANGIOGRAPHY;  Surgeon: Belva Crome, MD;  Location: [place] [facility];  Service: Cardiovascular;  Laterality: N/A;  . LEFT HEART CATH AND CORONARY ANGIOGRAPHY N/A 09/21/2017   Procedure: LEFT HEART CATH AND CORONARY ANGIOGRAPHY;  Surgeon: Belva Crome, MD;  Location: [place] [facility];  Service:  Cardiovascular;  Laterality: N/A;  . radio frequency ablation on lung spot    . rul lobectomy  2010  . TONSILLECTOMY    . TONSILLECTOMY      Current Medications: Prior to Admission medications   Medication Sig Start Date End Date Taking? Authorizing Provider  acetaminophen (TYLENOL) 650 MG CR tablet Take 1,300 mg by mouth 2 (two) times daily.    Yes [provider]  Calcium Carbonate-Vitamin D3 (CALCIUM 600-D) 600-400 MG-UNIT TABS Take 2 tablets by mouth daily.    Yes [provider]  ferrous sulfate 325 (65 FE) MG tablet Take 325 mg by mouth daily with breakfast.   Yes [provider]  fluticasone (FLONASE) 50 MCG/ACT nasal spray Place 1 spray into both nostrils daily as needed for allergies.    Yes [provider]  gabapentin (NEURONTIN) 300 MG capsule Take 600 mg by mouth at bedtime.  07/31/14  Yes [provider]  levothyroxine (SYNTHROID, LEVOTHROID) 100 MCG tablet TAKE 1 TABLET(100 MCG) BY MOUTH DAILY BEFORE BREAKFAST 09/13/17  Yes Dorothy Spark, MD  lidocaine (LIDODERM) 5 % Place 1 patch onto the skin daily. Remove & Discard patch within 12 hours or as directed by MD 10/17/17  Yes Cheryln Manly, NP  losartan (COZAAR) 50 MG tablet Take 1  tablet (50 mg total) by mouth 2 (two) times daily. 10/26/17  Yes Matricia Begnaud, PA  metoprolol tartrate (LOPRESSOR) 100 MG tablet Take 1 tablet (100 mg total) by mouth 2 (two) times daily. 10/17/17  Yes Cheryln Manly, NP  Multiple Vitamin (MULTIVITAMIN WITH MINERALS) TABS tablet Take 1 tablet by mouth daily.   Yes [provider]  Multiple Vitamins-Minerals (PRESERVISION AREDS 2 PO) Take 1 capsule by mouth 2 (two) times daily.   Yes [provider]  nitroGLYCERIN (NITROSTAT) 0.4 MG SL tablet Place 1 tablet (0.4 mg total) under the tongue every 5 (five) minutes as needed for chest pain. 10/17/17  Yes Cheryln Manly, NP  pantoprazole (PROTONIX) 40 MG tablet Take 1 tablet (40 mg  total) by mouth 2 (two) times daily. 10/17/17  Yes Reino Bellis B, NP  albuterol (PROVENTIL) (2.5 MG/3ML) 0.083% nebulizer solution Take 3 mLs (2.5 mg total) by nebulization every 4 (four) hours as needed for wheezing (use flutter valve). Patient not taking: Reported on 11/24/2017 10/17/17   Reino Bellis B, NP  arformoterol (BROVANA) 15 MCG/2ML NEBU Take 2 mLs (15 mcg total) by nebulization 2 (two) times daily. Patient not taking: Reported on 11/24/2017 10/17/17   Reino Bellis B, NP  atorvastatin (LIPITOR) 80 MG tablet Take 1 tablet (80 mg total) by mouth daily at 6 PM. Patient not taking: Reported on 11/24/2017 10/17/17   Reino Bellis B, NP  budesonide (PULMICORT) 0.5 MG/2ML nebulizer solution Take 2 mLs (0.5 mg total) by nebulization 2 (two) times daily. Patient not taking: Reported on 11/24/2017 10/17/17   Reino Bellis B, NP  hydrochlorothiazide (HYDRODIURIL) 12.5 MG tablet Take 1 tablet by mouth daily. 11/08/17   [provider]  ondansetron (ZOFRAN) 8 MG tablet Take 1 tablet by mouth as needed. 11/07/17   [provider]  pravastatin (PRAVACHOL) 20 MG tablet Take 1 tablet by mouth daily. 11/22/17   [provider]     Allergies:   Meperidine hcl; Penicillins; Morphine; Oxycodone-acetaminophen; Propoxyphene n-acetaminophen; and Tramadol hcl   Social History   Socioeconomic History  . Marital status: Widowed    Spouse name: Not on file  . Number of children: 2  . Years of education: Not on file  . Highest education level: Not on file  Occupational History  . Occupation: homemaker    Employer: RETIRED  Social Needs  . Financial resource strain: Not on file  . Food insecurity:    Worry: Not on file    Inability: Not on file  . Transportation needs:    Medical: Not on file    Non-medical: Not on file  Tobacco Use  . Smoking status: Former Smoker    Packs/day: 1.00    Years: 17.00    Pack years: 17.00    Types: Cigarettes    Last attempt to quit:  09/28/1975    Years since quitting: 42.1  . Smokeless tobacco: Never Used  . Tobacco comment: 33 yrs ago  Substance and Sexual Activity  . Alcohol use: Yes    Comment: glass wine daily  . Drug use: No  . Sexual activity: Not on file  Lifestyle  . Physical activity:    Days per week: Not on file    Minutes per session: Not on file  . Stress: Not on file  Relationships  . Social connections:    Talks on phone: Not on file    Gets together: Not on file    Attends religious service: Not on file  Active member of club or organization: Not on file    Attends meetings of clubs or organizations: Not on file    Relationship status: Not on file  Other Topics Concern  . Not on file  Social History Narrative   [employer] graduate   Married 1963   4 grandchildren and 2 step-grandchildren   Marriage in good health            Physician Roster:   Oncologist- Dr Julien Nordmann   Surgeon- Dr Lindell Spar- Dr Mable Fill- Dr Allyson Sabal     Family History:  The patient's family history includes Asthma in her mother; Cancer in her maternal grandfather and maternal uncle; Heart disease in her mother; Thyroid disease in her daughter.  ROS:   Please see the history of present illness.    ROS All other systems reviewed and are negative.   PHYSICAL EXAM:   VS:  BP 126/76   Pulse 74   Ht 5\' 3"  (1.6 m)   Wt 127 lb (57.6 kg)   SpO2 97%   BMI 22.50 kg/m    GEN: Well nourished, well developed, in no acute distress  HEENT: normal  Neck: no JVD, carotid bruits, or masses Cardiac: RRR; no murmurs, rubs, or gallops,no edema  Respiratory:  clear to auscultation bilaterally, normal work of breathing GI: soft, nontender, nondistended, + BS MS: no deformity or atrophy  Skin: warm and dry, no rash Neuro:  Alert and Oriented x 3, Strength and sensation are intact Psych: euthymic mood, full affect  Wt Readings from Last 3 Encounters:  11/24/17 127 lb (57.6 kg)  10/26/17 123 lb 6.4 oz (56 kg)  10/10/17 97  lb 7.1 oz (44.2 kg)      Studies/Labs Reviewed:   EKG:  EKG is not ordered today.    Recent Labs: 09/04/2017: ALT 11 09/24/2017: Magnesium 2.2 10/02/2017: TSH 1.768 10/04/2017: B Natriuretic Peptide 115.8 10/16/2017: BUN 8; Creatinine, Ser 0.85; Hemoglobin 8.9; Platelets 281; Potassium 3.8; Sodium 140   Lipid Panel    Component Value Date/Time   CHOL 193 08/22/2017 1221   TRIG 135 08/22/2017 1221   HDL 53 08/22/2017 1221   CHOLHDL 3.6 08/22/2017 1221   CHOLHDL 4.5 04/12/2016 0841   VLDL 39 (H) 04/12/2016 0841   LDLCALC 113 (H) 08/22/2017 1221   LDLDIRECT 137.6 05/17/2013 0937    Additional studies/ records that were reviewed today include:   Cath: 09/21/17 Conclusion    Segmental proximal to mid 90% RCA stenosis. This lesion was treated with stenting using overlapping Synergy 2.5 mm stents to postdilated to 3 mm in diameter with TIMI grade III flow. 0% stenosis was noted post procedure.  Ostial 50-60% circumflex narrowing.  Luminal irregularities throughout the proximal to mid LAD. Very distal eccentric tandem 70% stenoses near the apex. No focally high-grade stenosis is noted.  Normal left main  Low normal LV systolic function in the 81-19% range. Upper normal left ventricular end-diastolic pressure of 17 mmHg.  RECOMMENDATIONS:   Aspirin and Plavix for at least 6 months in the setting of chronic stable ischemic heart disease. Given overlapping stents it would be preferable to continue dual antiplatelet therapy for 12 months.  Aggressive risk factor modification using high intensity statin therapy for as long as tolerated. I have started atorvastatin 80 mg daily and discontinued pravastatin. Blood pressure control with target 130/80 mmHg or less.  Phase 2 cardiac rehab.  Overnight stay since the patient lives alone, is relatively frail, has  had some bleeding from the radial cath site.   Cath: 09/21/17 Conclusion    Acute occlusion of the mid right  coronary distal to the previously placed stents from earlier in the day. Acute occlusion was accompanied by ventricular fibrillation. The patient was successfully resuscitated.  Successful recanalization of the right coronary and placement of an additional stent overlapping the distal margin of the previously placed stent. Final stent diameter 3.25 mm throughout the entire stented segment from distal to proximal. 3.5 mm diameter [place] balloon was used at the very proximal stent margin after intravascular ultrasound demonstrated decreased apposition. TIMI grade III flow with no evidence of stenosis postprocedure.  Intravascular ultrasound was used and demonstrated acceptable stent apposition from distal to proximal with the exception of the proximal margin which was underexpanded. As noted above a 3.5 balloon was used to further expand the stent.  RECOMMENDATION:   Aggrastat for 10 hours.  Continue Plavix.  Critical care medicine to manage the patient's ventilator.  Continue IV Versed and fentanyl for sedation while on the ventilator.  Discussed the situation with family. Do not anticipate discharge for several days.  IV hydration and tract kidney function.    ASSESSMENT & PLAN:    1. CAD - s/p PCI/DESx 2 to the [facility] however developed acute  occlusion with VF arrest. The patient was successfully resuscitated and underwent successful recanalization of the right coronary and placement of an additional stent overlapping the distal margin of the previously placed stent. Not on any antiplatelet therapy due to recurrent hemorrhagic anemia. -Encourage to start aspirin 3 times per week as recommended by Dr. Tamala Julian who spoke with patient few days ago.  Patient will decide after her appointment with PCP in upcoming weeks.  She also wants to start on pain medication.  2. Anemia/Melena - Hgb 14.1 on 11/03/16. She has recheck lab schedule next week. No melena or blood in her stool or urine.    3. HLD - 08/22/2017: Cholesterol, Total 193; HDL 53; LDL Calculated 113; Triglycerides 135  - restart Lipitor 80mg . Labs in 6 weeks.   4. HTN -Her blood pressure running high prior to morning dose.  I will continue current medication.  This is managed by PCP.  Advised to get evening blood pressure reading prior to her scheduled dose and reviewed with PCP.  5. PAF - Brief episode of Afib rvr prior to VF arrest. Denies palpitations    Medication Adjustments/Labs and Tests Ordered: Current medicines are reviewed at length with the patient today.  Concerns regarding medicines are outlined above.  Medication changes, Labs and Tests ordered today are listed in the Patient Instructions below. There are no Patient Instructions on file for this visit.   Jarrett Soho, [place]  11/24/2017 3:16 PM    [facility] [place], [place],   [address] Phone: [phone]; Fax: [phone]

## 2017-11-24 NOTE — Patient Instructions (Addendum)
Your physician has recommended you make the following change in your medication:  STOP PRAVASTATIN AND RESTART ATORVASTATIN 80 MG EVERY PM  Your physician recommends that you return for lab work in:  [place] physician recommends that you schedule a follow-up appointment in:  [place]

## 2017-11-28 DIAGNOSIS — M150 Primary generalized (osteo)arthritis: Secondary | ICD-10-CM | POA: Diagnosis not present

## 2017-11-28 DIAGNOSIS — N183 Chronic kidney disease, stage 3 (moderate): Secondary | ICD-10-CM | POA: Diagnosis not present

## 2017-11-28 DIAGNOSIS — I2510 Atherosclerotic heart disease of native coronary artery without angina pectoris: Secondary | ICD-10-CM | POA: Diagnosis not present

## 2017-11-28 DIAGNOSIS — I509 Heart failure, unspecified: Secondary | ICD-10-CM | POA: Diagnosis not present

## 2017-11-28 DIAGNOSIS — I447 Left bundle-branch block, unspecified: Secondary | ICD-10-CM | POA: Diagnosis not present

## 2017-11-28 DIAGNOSIS — I130 Hypertensive heart and chronic kidney disease with heart failure and stage 1 through stage 4 chronic kidney disease, or unspecified chronic kidney disease: Secondary | ICD-10-CM | POA: Diagnosis not present

## 2017-11-30 DIAGNOSIS — I2510 Atherosclerotic heart disease of native coronary artery without angina pectoris: Secondary | ICD-10-CM | POA: Diagnosis not present

## 2017-11-30 DIAGNOSIS — I447 Left bundle-branch block, unspecified: Secondary | ICD-10-CM | POA: Diagnosis not present

## 2017-11-30 DIAGNOSIS — M150 Primary generalized (osteo)arthritis: Secondary | ICD-10-CM | POA: Diagnosis not present

## 2017-11-30 DIAGNOSIS — N183 Chronic kidney disease, stage 3 (moderate): Secondary | ICD-10-CM | POA: Diagnosis not present

## 2017-11-30 DIAGNOSIS — I509 Heart failure, unspecified: Secondary | ICD-10-CM | POA: Diagnosis not present

## 2017-11-30 DIAGNOSIS — I130 Hypertensive heart and chronic kidney disease with heart failure and stage 1 through stage 4 chronic kidney disease, or unspecified chronic kidney disease: Secondary | ICD-10-CM | POA: Diagnosis not present

## 2017-12-06 DIAGNOSIS — I447 Left bundle-branch block, unspecified: Secondary | ICD-10-CM | POA: Diagnosis not present

## 2017-12-06 DIAGNOSIS — I130 Hypertensive heart and chronic kidney disease with heart failure and stage 1 through stage 4 chronic kidney disease, or unspecified chronic kidney disease: Secondary | ICD-10-CM | POA: Diagnosis not present

## 2017-12-06 DIAGNOSIS — N183 Chronic kidney disease, stage 3 (moderate): Secondary | ICD-10-CM | POA: Diagnosis not present

## 2017-12-06 DIAGNOSIS — I509 Heart failure, unspecified: Secondary | ICD-10-CM | POA: Diagnosis not present

## 2017-12-06 DIAGNOSIS — M150 Primary generalized (osteo)arthritis: Secondary | ICD-10-CM | POA: Diagnosis not present

## 2017-12-06 DIAGNOSIS — I2510 Atherosclerotic heart disease of native coronary artery without angina pectoris: Secondary | ICD-10-CM | POA: Diagnosis not present

## 2017-12-07 DIAGNOSIS — I10 Essential (primary) hypertension: Secondary | ICD-10-CM | POA: Diagnosis not present

## 2017-12-07 DIAGNOSIS — K922 Gastrointestinal hemorrhage, unspecified: Secondary | ICD-10-CM | POA: Diagnosis not present

## 2017-12-11 ENCOUNTER — Other Ambulatory Visit: Payer: Self-pay | Admitting: Internal Medicine

## 2017-12-11 ENCOUNTER — Other Ambulatory Visit: Payer: Self-pay | Admitting: Medical Oncology

## 2017-12-11 DIAGNOSIS — C3490 Malignant neoplasm of unspecified part of unspecified bronchus or lung: Secondary | ICD-10-CM

## 2017-12-13 DIAGNOSIS — I130 Hypertensive heart and chronic kidney disease with heart failure and stage 1 through stage 4 chronic kidney disease, or unspecified chronic kidney disease: Secondary | ICD-10-CM | POA: Diagnosis not present

## 2017-12-13 DIAGNOSIS — I509 Heart failure, unspecified: Secondary | ICD-10-CM | POA: Diagnosis not present

## 2017-12-13 DIAGNOSIS — M150 Primary generalized (osteo)arthritis: Secondary | ICD-10-CM | POA: Diagnosis not present

## 2017-12-13 DIAGNOSIS — I2510 Atherosclerotic heart disease of native coronary artery without angina pectoris: Secondary | ICD-10-CM | POA: Diagnosis not present

## 2017-12-13 DIAGNOSIS — I447 Left bundle-branch block, unspecified: Secondary | ICD-10-CM | POA: Diagnosis not present

## 2017-12-13 DIAGNOSIS — N183 Chronic kidney disease, stage 3 (moderate): Secondary | ICD-10-CM | POA: Diagnosis not present

## 2017-12-18 ENCOUNTER — Telehealth (HOSPITAL_COMMUNITY): Payer: Self-pay

## 2017-12-18 NOTE — Telephone Encounter (Signed)
Called and spoke with patient in regards to Cardiac Rehab - Patient stated she is going to think about Cardiac Rehab and will give [place] a call in a couple of weeks if she is ready. Closed referral.

## 2017-12-25 DIAGNOSIS — N183 Chronic kidney disease, stage 3 (moderate): Secondary | ICD-10-CM | POA: Diagnosis not present

## 2018-01-01 ENCOUNTER — Other Ambulatory Visit: Payer: Medicare Other | Admitting: *Deleted

## 2018-01-01 DIAGNOSIS — E782 Mixed hyperlipidemia: Secondary | ICD-10-CM

## 2018-01-01 DIAGNOSIS — I700 Atherosclerosis of aorta: Secondary | ICD-10-CM | POA: Diagnosis not present

## 2018-01-01 LAB — LIPID PANEL
CHOL/HDL RATIO: 2 ratio (ref 0.0–4.4)
Cholesterol, Total: 110 mg/dL (ref 100–199)
HDL: 55 mg/dL (ref >39)
LDL Calculated: 38 mg/dL (ref 0–99)
Triglycerides: 87 mg/dL (ref 0–149)
VLDL CHOLESTEROL CAL: 17 mg/dL (ref 5–40)

## 2018-01-01 LAB — HEPATIC FUNCTION PANEL
ALK PHOS: 69 IU/L (ref 39–117)
ALT: 23 IU/L (ref 0–32)
AST: 22 IU/L (ref 0–40)
Albumin: 4.1 g/dL (ref 3.5–4.8)
BILIRUBIN, DIRECT: 0.21 mg/dL (ref 0.00–0.40)
Bilirubin Total: 0.4 mg/dL (ref 0.0–1.2)
TOTAL PROTEIN: 5.7 g/dL — AB (ref 6.0–8.5)

## 2018-01-02 NOTE — Progress Notes (Signed)
Pt has been made aware of normal result and verbalized understanding.  jw 01/02/18

## 2018-01-05 ENCOUNTER — Inpatient Hospital Stay: Payer: Medicare Other | Attending: Internal Medicine

## 2018-01-05 ENCOUNTER — Ambulatory Visit (HOSPITAL_COMMUNITY)
Admission: RE | Admit: 2018-01-05 | Discharge: 2018-01-05 | Disposition: A | Discharge status: Home / Self Care | Payer: Medicare Other | Source: Ambulatory Visit | Attending: Internal Medicine | Admitting: Internal Medicine

## 2018-01-05 DIAGNOSIS — X58XXXA Exposure to other specified factors, initial encounter: Secondary | ICD-10-CM | POA: Diagnosis not present

## 2018-01-05 DIAGNOSIS — C3492 Malignant neoplasm of unspecified part of left bronchus or lung: Secondary | ICD-10-CM | POA: Diagnosis not present

## 2018-01-05 DIAGNOSIS — S2221XA Fracture of manubrium, initial encounter for closed fracture: Secondary | ICD-10-CM | POA: Insufficient documentation

## 2018-01-05 DIAGNOSIS — R911 Solitary pulmonary nodule: Secondary | ICD-10-CM | POA: Insufficient documentation

## 2018-01-05 DIAGNOSIS — J439 Emphysema, unspecified: Secondary | ICD-10-CM | POA: Diagnosis not present

## 2018-01-05 DIAGNOSIS — C3490 Malignant neoplasm of unspecified part of unspecified bronchus or lung: Secondary | ICD-10-CM | POA: Diagnosis not present

## 2018-01-05 DIAGNOSIS — I700 Atherosclerosis of aorta: Secondary | ICD-10-CM | POA: Insufficient documentation

## 2018-01-05 LAB — COMPREHENSIVE METABOLIC PANEL
ALT: 24 U/L (ref 0–44)
AST: 22 U/L (ref 15–41)
Albumin: 3.8 g/dL (ref 3.5–5.0)
Alkaline Phosphatase: 60 U/L (ref 38–126)
Anion gap: 8 (ref 5–15)
BILIRUBIN TOTAL: 0.8 mg/dL (ref 0.3–1.2)
BUN: 19 mg/dL (ref 8–23)
CHLORIDE: 103 mmol/L (ref 98–111)
CO2: 31 mmol/L (ref 22–32)
CREATININE: 1.18 mg/dL — AB (ref 0.44–1.00)
Calcium: 11 mg/dL — ABNORMAL HIGH (ref 8.9–10.3)
GFR calc Af Amer: 50 mL/min — ABNORMAL LOW (ref >60)
GFR, EST NON AFRICAN AMERICAN: 43 mL/min — AB (ref >60)
Glucose, Bld: 101 mg/dL — ABNORMAL HIGH (ref 70–99)
Potassium: 4.4 mmol/L (ref 3.5–5.1)
Sodium: 142 mmol/L (ref 135–145)
Total Protein: 6.3 g/dL — ABNORMAL LOW (ref 6.5–8.1)

## 2018-01-05 LAB — CBC WITH DIFFERENTIAL (CANCER CENTER ONLY)
Basophils Absolute: 0.1 10*3/uL (ref 0.0–0.1)
Basophils Relative: 1 %
Eosinophils Absolute: 0.4 10*3/uL (ref 0.0–0.5)
Eosinophils Relative: 7 %
HEMATOCRIT: 40.9 % (ref 34.8–46.6)
HEMOGLOBIN: 13.4 g/dL (ref 11.6–15.9)
LYMPHS PCT: 23 %
Lymphs Abs: 1.4 10*3/uL (ref 0.9–3.3)
MCH: 28.6 pg (ref 25.1–34.0)
MCHC: 32.7 g/dL (ref 31.5–36.0)
MCV: 87.6 fL (ref 79.5–101.0)
Monocytes Absolute: 0.9 10*3/uL (ref 0.1–0.9)
Monocytes Relative: 15 %
NEUTROS ABS: 3.4 10*3/uL (ref 1.5–6.5)
Neutrophils Relative %: 54 %
Platelet Count: 192 10*3/uL (ref 145–400)
RBC: 4.67 MIL/uL (ref 3.70–5.45)
RDW: 13.9 % (ref 11.2–14.5)
WBC: 6.2 10*3/uL (ref 3.9–10.3)

## 2018-01-05 MED ORDER — IOPAMIDOL (ISOVUE-300) INJECTION 61%: 75.0000 mL | Freq: Once | INTRAVENOUS | Status: DC | PRN

## 2018-01-05 MED ORDER — IOHEXOL 300 MG/ML  SOLN
75.0000 mL | Freq: Once | INTRAMUSCULAR | Status: AC | PRN
  Administered 2018-01-05: 75 mL via INTRAVENOUS

## 2018-01-08 DIAGNOSIS — E039 Hypothyroidism, unspecified: Secondary | ICD-10-CM | POA: Diagnosis not present

## 2018-01-08 DIAGNOSIS — H6120 Impacted cerumen, unspecified ear: Secondary | ICD-10-CM | POA: Diagnosis not present

## 2018-01-08 DIAGNOSIS — Z1389 Encounter for screening for other disorder: Secondary | ICD-10-CM | POA: Diagnosis not present

## 2018-01-08 DIAGNOSIS — K922 Gastrointestinal hemorrhage, unspecified: Secondary | ICD-10-CM | POA: Diagnosis not present

## 2018-01-08 DIAGNOSIS — C3491 Malignant neoplasm of unspecified part of right bronchus or lung: Secondary | ICD-10-CM | POA: Diagnosis not present

## 2018-01-08 DIAGNOSIS — N183 Chronic kidney disease, stage 3 (moderate): Secondary | ICD-10-CM | POA: Diagnosis not present

## 2018-01-08 DIAGNOSIS — I10 Essential (primary) hypertension: Secondary | ICD-10-CM | POA: Diagnosis not present

## 2018-01-08 DIAGNOSIS — I2510 Atherosclerotic heart disease of native coronary artery without angina pectoris: Secondary | ICD-10-CM | POA: Diagnosis not present

## 2018-01-09 ENCOUNTER — Encounter: Payer: Self-pay | Admitting: Radiation Oncology

## 2018-01-09 ENCOUNTER — Telehealth: Payer: Self-pay | Admitting: Internal Medicine

## 2018-01-09 ENCOUNTER — Inpatient Hospital Stay: Payer: Medicare Other | Attending: Internal Medicine | Admitting: Internal Medicine

## 2018-01-09 ENCOUNTER — Encounter: Payer: Self-pay | Admitting: Internal Medicine

## 2018-01-09 ENCOUNTER — Encounter: Payer: Self-pay | Admitting: *Deleted

## 2018-01-09 VITALS — BP 177/71 | HR 65 | Temp 97.7°F | Resp 20 | Ht 63.5 in | Wt 125.8 lb

## 2018-01-09 DIAGNOSIS — R5383 Other fatigue: Secondary | ICD-10-CM | POA: Diagnosis not present

## 2018-01-09 DIAGNOSIS — C3412 Malignant neoplasm of upper lobe, left bronchus or lung: Secondary | ICD-10-CM | POA: Insufficient documentation

## 2018-01-09 DIAGNOSIS — I10 Essential (primary) hypertension: Secondary | ICD-10-CM | POA: Diagnosis not present

## 2018-01-09 DIAGNOSIS — C3492 Malignant neoplasm of unspecified part of left bronchus or lung: Secondary | ICD-10-CM

## 2018-01-09 DIAGNOSIS — E039 Hypothyroidism, unspecified: Secondary | ICD-10-CM

## 2018-01-09 DIAGNOSIS — H6122 Impacted cerumen, left ear: Secondary | ICD-10-CM | POA: Diagnosis not present

## 2018-01-09 DIAGNOSIS — C4330 Malignant melanoma of unspecified part of face: Secondary | ICD-10-CM

## 2018-01-09 NOTE — Telephone Encounter (Signed)
Referral to radiation oncology. Follow-up visit will be scheduled after the patient makes a decision regarding her treatment per 7/2 los

## 2018-01-09 NOTE — Progress Notes (Signed)
[place] [phone]   [phone]  OFFICE PROGRESS NOTE  Wenda Low, MD [address] [place] [address]  PRINCIPAL DIAGNOSES:  1. 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 The patient presented at that time with a right upper lobe lesion, as well as suspicious ground-glass opacities in the left lung. The tumor was negative for EGFR mutation and negative for ALK gene translocation. Veristrat test Good. EGFR mutation studies were conflicting, initial test was negative, second test was positive for EGFR mutation in exon 18 and the the test from [facility] one was negative with positive KRAS mutation in Exon 12. 2. Stage IA malignant melanoma, status post wide excision by Dr. Allyson Sabal on Nov 12, 2008.  Genomic Alterations Identified? KDR amplification - equivocal? KRAS G12C KIT amplification - equivocal? TP53 [id number] RICTOR amplification CDKN2A/B loss FGF10 amplification Additional Disease-relevant Genes with No Reportable Alterations Detected EGFR  PRIOR THERAPY:  1) Status post right upper lobectomy with lymph node dissection under the care of Dr. Arlyce Dice on May 21, 2008.  2) Stereotactic radiotherapy to the left upper lobe lung nodule under the care of Dr. Pablo Ledger expected to be completed on 10/21/2011.  3) stereotactic radiotherapy to the left prevascular lymphadenopathy under the care of Dr. Pablo Ledger completed on 09/26/2013. 4) Tarceva 150 mg by mouth daily started 11/29/2013 discontinued today secondary to intolerance with persistent diarrhea and fatigue.  5) Tarceva 100 mg by mouth daily started on 12/27/2013. Discontinued one week after treatment because of intolerance. 6) status post thermal ablation of the left upper lobe recurrent adenocarcinoma under the care of Dr. Laurence Ferrari on  10/01/2014  CURRENT THERAPY: Observation.  Advanced directives: The patient has advanced directive and no change is requested.  INTERVAL HISTORY: [name] 77 y.o. female returns to the clinic today for follow-up visit.  The patient is feeling much better today.  She was admitted to the hospital by her cardiologist on September 20, 2017 for coronary artery disease and stent placement.  This procedure was complicated with acute occlusion of the stent accompanied by ventricular fibrillation requiring resuscitation.  She was started on Plavix at that time.  This was complicated with significant GI bleeding.  She received around 10 units of PRBCs transfusion during her stay.  Plavix was discontinued and the patient started feeling better and her hemoglobin improved.  She continues to have mild fatigue.  She denied having any current chest pain, shortness of breath, cough or hemoptysis.  She denied having any fever or chills.  She has no nausea, vomiting, diarrhea or constipation.  She has no recent weight loss or night sweats.  She had repeat CT scan of the chest performed recently and she is here for evaluation and discussion of her risk her results.  MEDICAL HISTORY: Past Medical History:  Diagnosis Date  . Anemia 09/2017  . Arthritis    back.and feet  . Cervical ca ([place]) dx'd 1988   surg only  . CKD (chronic kidney disease), stage III ([place])   . Complication of anesthesia    difficulty awakening, dysrhythmia post surgery, (prolonged sedation level)  . Coronary artery calcification seen on CT scan   . Dyspnea   . GERD (gastroesophageal reflux disease)   . Headache    occ. sinus type headaches  . History of hiatal hernia   . Hx of radiation therapy 09/16/13-09/30/13  prevascular lymph node  . Hyperlipidemia   . Hypertension   . Hyperthyroidism    dr Loanne Drilling- radioactive iodine treatment 2011- resolved  . Hypothyroidism   . LBBB (left bundle branch block)   . Lung cancer ([place])     Bilaterally, RUL lobectomy and spot involving both lungs- "cancer"  . Macular degeneration    bilateral  . Melanoma ([place])    left leg  . Radiation 10/11/11-10/21/11   Left upper lobe adenocarcinoma  . Tremor    hand- intention tremor    ALLERGIES:  is allergic to meperidine hcl; penicillins; morphine; oxycodone-acetaminophen; propoxyphene n-acetaminophen; and tramadol hcl.  MEDICATIONS:  Current Outpatient Medications  Medication Sig Dispense Refill  . acetaminophen (TYLENOL) 650 MG CR tablet Take 1,300 mg by mouth 2 (two) times daily.     Marland Kitchen albuterol (PROVENTIL) (2.5 MG/3ML) 0.083% nebulizer solution Take 3 mLs (2.5 mg total) by nebulization every 4 (four) hours as needed for wheezing (use flutter valve). (Patient not taking: Reported on 11/24/2017) 75 mL 12  . arformoterol (BROVANA) 15 MCG/2ML NEBU Take 2 mLs (15 mcg total) by nebulization 2 (two) times daily. (Patient not taking: Reported on 11/24/2017) 120 mL   . atorvastatin (LIPITOR) 80 MG tablet Take 1 tablet (80 mg total) by mouth daily. 90 tablet 3  . budesonide (PULMICORT) 0.5 MG/2ML nebulizer solution Take 2 mLs (0.5 mg total) by nebulization 2 (two) times daily. (Patient not taking: Reported on 11/24/2017)  12  . Calcium Carbonate-Vitamin D3 (CALCIUM 600-D) 600-400 MG-UNIT TABS Take 2 tablets by mouth daily.     . ferrous sulfate 325 (65 FE) MG tablet Take 325 mg by mouth daily with breakfast.    . fluticasone (FLONASE) 50 MCG/ACT nasal spray Place 1 spray into both nostrils daily as needed for allergies.     Marland Kitchen gabapentin (NEURONTIN) 300 MG capsule Take 600 mg by mouth at bedtime.   2  . hydrochlorothiazide (HYDRODIURIL) 12.5 MG tablet Take 1 tablet by mouth daily.  11  . levothyroxine (SYNTHROID, LEVOTHROID) 100 MCG tablet TAKE 1 TABLET(100 MCG) BY MOUTH DAILY BEFORE BREAKFAST 90 tablet 3  . lidocaine (LIDODERM) 5 % Place 1 patch onto the skin daily. Remove & Discard patch within 12 hours or as directed by MD 30 patch 0  . losartan  (COZAAR) 50 MG tablet Take 1 tablet (50 mg total) by mouth 2 (two) times daily. 180 tablet 3  . metoprolol tartrate (LOPRESSOR) 100 MG tablet Take 1 tablet (100 mg total) by mouth 2 (two) times daily.    . Multiple Vitamin (MULTIVITAMIN WITH MINERALS) TABS tablet Take 1 tablet by mouth daily.    . Multiple Vitamins-Minerals (PRESERVISION AREDS 2 PO) Take 1 capsule by mouth 2 (two) times daily.    . nitroGLYCERIN (NITROSTAT) 0.4 MG SL tablet Place 1 tablet (0.4 mg total) under the tongue every 5 (five) minutes as needed for chest pain.  0  . ondansetron (ZOFRAN) 8 MG tablet Take 1 tablet by mouth as needed.  1  . pantoprazole (PROTONIX) 40 MG tablet Take 1 tablet (40 mg total) by mouth 2 (two) times daily.     No current facility-administered medications for this visit.     SURGICAL HISTORY:  Past Surgical History:  Procedure Laterality Date  . basal cell and squamous cell skin cell area removed     new left lower eye lid done with skin graft   . CARDIAC CATHETERIZATION  09/21/2017  . CATARACT EXTRACTION, BILATERAL Bilateral   .  COLONOSCOPY WITH PROPOFOL N/A 10/27/2016   Procedure: COLONOSCOPY WITH PROPOFOL;  Surgeon: Laurence Spates, MD;  Location: [facility] ENDOS[facility];  Service: Endoscopy;  Laterality: N/A;  . conization for cervical dysplasia    . CORONARY STENT INTERVENTION N/A 09/21/2017   Procedure: CORONARY STENT INTERVENTION;  Surgeon: Belva Crome, MD;  Location: [place] [facility];  Service: Cardiovascular;  Laterality: N/A;  . CORONARY/GRAFT ACUTE MI REVASCULARIZATION N/A 09/21/2017   Procedure: Coronary/Graft Acute MI Revascularization;  Surgeon: Belva Crome, MD;  Location: [place] [facility];  Service: Cardiovascular;  Laterality: N/A;  . ESOPHAGOGASTRODUODENOSCOPY (EGD) WITH PROPOFOL N/A 09/28/2017   Procedure: ESOPHAGOGASTRODUODENOSCOPY (EGD) WITH PROPOFOL;  Surgeon: Arta Silence, MD;  Location: [place];  Service: Endoscopy;  Laterality: N/A;  . ESOPHAGOGASTRODUODENOSCOPY  (EGD) WITH PROPOFOL N/A 09/30/2017   Procedure: ESOPHAGOGASTRODUODENOSCOPY (EGD) WITH PROPOFOL;  Surgeon: Otis Brace, MD;  Location: [facility];  Service: Gastroenterology;  Laterality: N/A;  . esophogus stretching    . GIVENS CAPSULE STUDY N/A 10/06/2017   Procedure: GIVENS CAPSULE STUDY;  Surgeon: Arta Silence, MD;  Location[facility] ENDOSCOPY;  Service: Endoscopy;  Laterality: N/A;  . INTRAVASCULAR ULTRASOUND/IVUS N/A 09/21/2017   Procedure: Intravascular Ultrasound/IVUS;  Surgeon: Belva Crome, MD;  Location: [place] CV LAB;  Service: Cardiovascular;  Laterality: N/A;  . IR GENERIC HISTORICAL  09/11/2014   IR RADIOLOGIST EVAL & MGMT 09/11/2014 Jacqulynn Cadet, MD [facility] INTERV RAD  . LEFT HEART CATH AND CORONARY ANGIOGRAPHY N/A 09/21/2017   Procedure: LEFT HEART CATH AND CORONARY ANGIOGRAPHY;  Surgeon: Belva Crome, MD;  Location: [place] [facility];  Service: Cardiovascular;  Laterality: N/A;  . LEFT HEART CATH AND CORONARY ANGIOGRAPHY N/A 09/21/2017   Procedure: LEFT HEART CATH AND CORONARY ANGIOGRAPHY;  Surgeon: Belva Crome, MD;  Location: [place] CV LAB;  Service: Cardiovascular;  Laterality: N/A;  . radio frequency ablation on lung spot    . rul lobectomy  2010  . TONSILLECTOMY    . TONSILLECTOMY      REVIEW OF SYSTEMS:  Constitutional: positive for fatigue Eyes: negative Ears, nose, mouth, throat, and face: negative Respiratory: negative Cardiovascular: negative Gastrointestinal: negative Genitourinary:negative Integument/breast: negative Hematologic/lymphatic: negative Musculoskeletal:negative Neurological: negative Behavioral/Psych: negative Endocrine: negative Allergic/Immunologic: negative   PHYSICAL EXAMINATION: General appearance: alert, cooperative, fatigued and no distress Head: Normocephalic, without obvious abnormality, atraumatic Neck: no adenopathy Lymph nodes: Cervical, supraclavicular, and axillary nodes normal. Resp: clear to auscultation  bilaterally Back: symmetric, no curvature. ROM normal. No CVA tenderness. Cardio: regular rate and rhythm, S1, S2 normal, no murmur, click, rub or gallop GI: soft, non-tender; bowel sounds normal; no masses,  no organomegaly Extremities: extremities normal, atraumatic, no cyanosis or edema Neurologic: Alert and oriented X 3, normal strength and tone. Normal symmetric reflexes. Normal coordination and gait  ECOG PERFORMANCE STATUS: 1 - Symptomatic but completely ambulatory  Blood pressure (!) 177/71, pulse 65, temperature 97.7 F (36.5 C), temperature source Oral, resp. rate 20, height 5' 3.5" (1.613 m), weight 125 lb 12.8 oz (57.1 kg), SpO2 97 %.  LABORATORY DATA: Lab Results  Component Value Date   WBC 6.2 01/05/2018   HGB 13.4 01/05/2018   HCT 40.9 01/05/2018   MCV 87.6 01/05/2018   PLT 192 01/05/2018      Chemistry      Component Value Date/Time   NA 142 01/05/2018 0945   NA 137 09/20/2017 0958   NA 141 05/29/2017 1136   K 4.4 01/05/2018 0945   K 4.5 05/29/2017 1136  CL 103 01/05/2018 0945   CL 105 07/17/2012 1434   CO2 31 01/05/2018 0945   CO2 28 05/29/2017 1136   BUN 19 01/05/2018 0945   BUN 25 09/20/2017 0958   BUN 26.1 (H) 05/29/2017 1136   CREATININE 1.18 (H) 01/05/2018 0945   CREATININE 1.3 (H) 05/29/2017 1136      Component Value Date/Time   CALCIUM 11.0 (H) 01/05/2018 0945   CALCIUM 10.4 05/29/2017 1136   ALKPHOS 60 01/05/2018 0945   ALKPHOS 59 05/29/2017 1136   AST 22 01/05/2018 0945   AST 15 05/29/2017 1136   ALT 24 01/05/2018 0945   ALT 12 05/29/2017 1136   BILITOT 0.8 01/05/2018 0945   BILITOT 0.4 01/01/2018 0933   BILITOT 0.51 05/29/2017 1136       RADIOGRAPHIC STUDIES: Ct Chest W Contrast  Result Date: 01/08/2018 CLINICAL DATA:  Non-small-cell lung cancer. EXAM: CT CHEST WITH CONTRAST TECHNIQUE: Multidetector CT imaging of the chest was performed during intravenous contrast administration. CONTRAST:  59m OMNIPAQUE IOHEXOL 300 MG/ML  SOLN  COMPARISON:  09/04/2017 FINDINGS: Cardiovascular: Heart size upper normal. No pericardial effusion. Coronary artery calcification is evident. Atherosclerotic calcification is noted in the wall of the thoracic aorta. Mediastinum/Nodes: No mediastinal lymphadenopathy. Stable appearance of normal short axis diameter lymph nodes in the AP window and precarinal space. No right hilar lymphadenopathy. Abnormal soft tissue again noted anterior left hilum. Small hiatal hernia. There is no axillary lymphadenopathy. Lungs/Pleura: Central left upper lobe mass has progressed in the interval, measuring 4.1 x 4.8 cm today compared to 3.7 x 3.7 cm previously. As before, lesion encases and attenuates left upper lobe bronchi. Retro hilar left lower lobe nodule (46:5) measures 2.6 x 1.7 cm today compared to 2.1 x 1.3 cm previously. Sub solid left lower lobe nodule described previously is similar in size today at 2.8 x 1.3 cm (53:5) although the dominant left upper lobe mass bulges and potentially crosses the major fissure into this lesion on today's study. The 1.5 cm nodule identified in the lingula on the prior study has become incorporated into the dominant left upper lobe mass. Scattered subcentimeter ground-glass nodules are again noted bilaterally. Adjacent 4 mm nodules in the right middle lobe (83:5) are new in the interval. 7 mm right middle lobe nodule (88:5) is new since prior study. These right middle lobe nodules are associated with small airway impaction and tree-in-bud opacity in the right middle lobe and may be related to atypical infection, but follow-up will be required. 7 mm left lower lobe nodule (114:5) is similar to prior. Additional tiny scattered pulmonary nodules are evident bilaterally. Upper Abdomen: Cortical scarring noted in the visualized portion of each upper kidney as are small renal cysts. Small left para-aortic lymph node medial to the left adrenal gland is stable. Musculoskeletal: Degenerative changes  noted in the thoracic spine. Fracture nonunion of the manubrium and mid sternum is new in the interval and pathologic fracture of the sternum cannot be excluded. IMPRESSION: 1. Interval progression of central left upper lobe mass. Associated retro hilar left lower lobe nodule seen on the previous study has also progressed. 2. New right middle lobe pulmonary nodules, potentially related to atypical infection. Close attention on follow-up recommended as metastatic disease not excluded. 3. Multiple solid and sub solid small pulmonary nodules scattered bilaterally without substantial interval change. 4. Fractures identified in the manubrium and midsternum. Lucency around the sternal injury raises the question of pathologic fracture. 5.  Emphysema. (ICD10-J43.9) 6.  Aortic Atherosclerois (ICD10-170.0)  Electronically Signed   By: Misty Stanley M.D.   On: 01/08/2018 08:36   ASSESSMENT AND PLAN:  This is a very pleasant 77 years old white female with recurrent non-small cell lung cancer, adenocarcinoma with questionable EGFR mutation. The patient was treated in the past with Tarceva for a few weeks but this was discontinued secondary to internal returns and significant fatigue and the patient refused to resume any further treatment at that time. She underwent several local treatment including stereotactic radiotherapy as well as radiofrequency ablation to the current tumor in the left upper lobe. The patient is currently on observation and she is feeling fine.  Repeat CT scan of the chest performed recently showed evidence for disease progression with enlargement of the central left upper lobe lung mass as well as retrohilar left lower lobe nodule.  I personally and independently reviewed the scan images and discussed the result and showed the images to the patient today. I discussed with the patient several options for management of her condition including referral back to radiation oncology for reevaluation and  consideration of palliative radiotherapy to the enlarging left lung masses.  I also discussed with the patient the option of palliative systemic chemotherapy with carboplatin, Alimta and Ketruda (pembrolizumab) versus palliative care.  The patient would like to see radiation oncology for evaluation of her condition and she would also like to take some time to think about her chemotherapy options. I would not arrange a follow-up appointment for the patient for now on telemetry here back from her regarding her decision.  If she decided against systemic chemotherapy, I will see her back for follow-up visit in 3 months with repeat CT scan of the chest. For hypertension, she was advised to monitor her blood pressure closely at home and to reconsult with her primary care physician for adjustment of her medication. The patient was advised to call immediately if she has any concerning symptoms in the interval. All questions were answered. The patient knows to call the clinic with any problems, questions or concerns. We can certainly see the patient much sooner if necessary. I spent 15 minutes counseling the patient face to face. The total time spent in the appointment was 25 minutes.  Disclaimer: This note was dictated with voice recognition software. Similar sounding words can inadvertently be transcribed and may not be corrected upon review.

## 2018-01-09 NOTE — Progress Notes (Signed)
Oncology Nurse Navigator Documentation  Oncology Nurse Navigator Flowsheets 01/09/2018  Navigator Location [facility]  Navigator Encounter Type Clinic/MDC/I spoke with patient today.  She recently had a long stay in the hospital after having a cardiac stent placed and then coding.  I listened as she explained.  She states she is doing well now and has recovered. Dr. Julien Nordmann updated her that she has disease progression and she needs to start systemic therapy.  She is going to think about her options.  I explained to her treatment plan and offered her support and encouragement.    Patient Visit Type MedOnc  Treatment Phase Follow-up  Barriers/Navigation Needs Education  Education Other  Interventions Education  Education Method Verbal  Acuity Level 1  Time Spent with Patient 30

## 2018-01-15 NOTE — Progress Notes (Signed)
Thoracic Location of Tumor / Histology: Recurrent non-small cell lung cancer, adenocarcinoma   Presented in 2006 with nodule, observed for 4 years.  Patient presented in 2010 with a right upper lobe lesion, as well as suspicious ground glass opacities in the left lung.  The tumor was negative for EGFR mutation and negative for ALK gene translocation.  CT Chest 01/05/2018: evidence for disease progression with enlargement of the central left upper lobe lung mass as well as retro-hilar left lower lobe nodule.  1. Interval progression of central left upper lobe mass. Associated retro hilar left lower lobe nodule seen on the previous study has also progressed. 2. New right middle lobe pulmonary nodules, potentially related to atypical infection. Close attention on follow-up recommended as metastatic disease not excluded. 3. Multiple solid and sub solid small pulmonary nodules scattered bilaterally without substantial interval change. 4. Fractures identified in the manubrium and midsternum. Lucency around the sternal injury raises the question of pathologic fracture.  Biopsies of   Tobacco/Marijuana/Snuff/ETOH use: Former smoker quit in 1977  Past/Anticipated interventions by cardiothoracic surgery, if any:  Dr. Allyson Sabal- Stage IA malignant melanoma, status post wide excision, 11/12/2008. Right upper lobectomy with lymph node dissection under care of Dr. Arlyce Dice 05/21/2008  Past/Anticipated interventions by medical oncology, if any:  Dr. Julien Nordmann 01/09/2018 -I discussed with the patient several options for management of her condition including referral back to radiation oncology for reevaluation and consideration of palliative radiotherapy to the enlarging left lung masses.   -I also discussed with the patient the option of palliative systemic chemotherapy with carboplatin, Alimta and Ketruda (pembrolizumab) versus palliative care.   -The patient would like to see radiation oncology for evaluation of her  condition and she would also like to take some time to think about her chemotherapy options. -I would not arrange a follow-up appointment for the patient for now on telemetry here back from her regarding her decision.  If she decided against systemic chemotherapy, I will see her back for follow-up visit in 3 months with repeat CT scan of the chest.  Signs/Symptoms  Weight changes, if any: No  Respiratory complaints, if any: SOB- mostly with activities.  Becomes SOB with ease.  Hemoptysis, if any: Cough main;y at night.  Non productive.  Pain issues, if any: No  BP (!) 184/69 (BP Location: Left Arm, Patient Position: Sitting, Cuff Size: Normal)   Pulse 72   Temp (!) 97.5 F (36.4 C) (Oral)   Resp 18   Ht 5' 3.5" (1.613 m)   Wt 126 lb 12.8 oz (57.5 kg)   SpO2 97%   BMI 22.11 kg/m    Wt Readings from Last 3 Encounters:  01/16/18 126 lb 12.8 oz (57.5 kg)  01/09/18 125 lb 12.8 oz (57.1 kg)  11/24/17 127 lb (57.6 kg)    SAFETY ISSUES:  Prior radiation? Yes, SBRT left upper lobe-60 gray in 5 fractions 10/2011, SBRT left mediastinal/prevascular lymph node 50 Gy 09/2013.  Pacemaker/ICD?  No  Possible current pregnancy? No  Is the patient on methotrexate? No  Current Complaints / other details:   PRIOR THERAPY:  1) Status post right upper lobectomy with lymph node dissection under the care of Dr. Arlyce Dice on May 21, 2008.  2) Stereotactic radiotherapy to the left upper lobe lung nodule under the care of Dr. Pablo Ledger expected to be completed on 10/21/2011.  3) stereotactic radiotherapy to the left prevascular lymphadenopathy under the care of Dr. Pablo Ledger completed on 09/26/2013. 4) Tarceva 150 mg by mouth  daily started 11/29/2013 discontinued today secondary to intolerance with persistent diarrhea and fatigue.  5) Tarceva 100 mg by mouth daily started on 12/27/2013. Discontinued one week after treatment because of intolerance. 6) status post thermal ablation of the left upper  lobe recurrent adenocarcinoma under the care of Dr. Laurence Ferrari on 10/01/2014

## 2018-01-16 ENCOUNTER — Ambulatory Visit
Admission: RE | Admit: 2018-01-16 | Discharge: 2018-01-16 | Disposition: A | Discharge status: Home / Self Care | Payer: Medicare Other | Source: Ambulatory Visit | Attending: Radiation Oncology | Admitting: Radiation Oncology

## 2018-01-16 ENCOUNTER — Other Ambulatory Visit: Payer: Self-pay

## 2018-01-16 ENCOUNTER — Encounter: Payer: Self-pay | Admitting: Radiation Oncology

## 2018-01-16 VITALS — BP 184/69 | HR 72 | Temp 97.5°F | Resp 18 | Ht 63.5 in | Wt 126.8 lb

## 2018-01-16 DIAGNOSIS — K219 Gastro-esophageal reflux disease without esophagitis: Secondary | ICD-10-CM | POA: Insufficient documentation

## 2018-01-16 DIAGNOSIS — E785 Hyperlipidemia, unspecified: Secondary | ICD-10-CM | POA: Insufficient documentation

## 2018-01-16 DIAGNOSIS — I2510 Atherosclerotic heart disease of native coronary artery without angina pectoris: Secondary | ICD-10-CM | POA: Insufficient documentation

## 2018-01-16 DIAGNOSIS — Z8541 Personal history of malignant neoplasm of cervix uteri: Secondary | ICD-10-CM | POA: Diagnosis not present

## 2018-01-16 DIAGNOSIS — N183 Chronic kidney disease, stage 3 (moderate): Secondary | ICD-10-CM | POA: Diagnosis not present

## 2018-01-16 DIAGNOSIS — Z87891 Personal history of nicotine dependence: Secondary | ICD-10-CM | POA: Insufficient documentation

## 2018-01-16 DIAGNOSIS — Z885 Allergy status to narcotic agent status: Secondary | ICD-10-CM | POA: Insufficient documentation

## 2018-01-16 DIAGNOSIS — I129 Hypertensive chronic kidney disease with stage 1 through stage 4 chronic kidney disease, or unspecified chronic kidney disease: Secondary | ICD-10-CM | POA: Diagnosis not present

## 2018-01-16 DIAGNOSIS — Z79899 Other long term (current) drug therapy: Secondary | ICD-10-CM | POA: Diagnosis not present

## 2018-01-16 DIAGNOSIS — C3492 Malignant neoplasm of unspecified part of left bronchus or lung: Secondary | ICD-10-CM | POA: Diagnosis present

## 2018-01-16 DIAGNOSIS — I2584 Coronary atherosclerosis due to calcified coronary lesion: Secondary | ICD-10-CM | POA: Diagnosis not present

## 2018-01-16 DIAGNOSIS — Z880 Allergy status to penicillin: Secondary | ICD-10-CM | POA: Insufficient documentation

## 2018-01-16 DIAGNOSIS — I10 Essential (primary) hypertension: Secondary | ICD-10-CM | POA: Diagnosis not present

## 2018-01-16 DIAGNOSIS — N281 Cyst of kidney, acquired: Secondary | ICD-10-CM | POA: Diagnosis not present

## 2018-01-16 DIAGNOSIS — C3411 Malignant neoplasm of upper lobe, right bronchus or lung: Secondary | ICD-10-CM | POA: Insufficient documentation

## 2018-01-16 DIAGNOSIS — Z923 Personal history of irradiation: Secondary | ICD-10-CM | POA: Insufficient documentation

## 2018-01-16 DIAGNOSIS — D649 Anemia, unspecified: Secondary | ICD-10-CM | POA: Diagnosis not present

## 2018-01-16 DIAGNOSIS — H3530 Unspecified macular degeneration: Secondary | ICD-10-CM | POA: Diagnosis not present

## 2018-01-16 DIAGNOSIS — Z85820 Personal history of malignant melanoma of skin: Secondary | ICD-10-CM | POA: Insufficient documentation

## 2018-01-16 DIAGNOSIS — Z9889 Other specified postprocedural states: Secondary | ICD-10-CM | POA: Insufficient documentation

## 2018-01-16 DIAGNOSIS — Z955 Presence of coronary angioplasty implant and graft: Secondary | ICD-10-CM | POA: Insufficient documentation

## 2018-01-16 DIAGNOSIS — Z902 Acquired absence of lung [part of]: Secondary | ICD-10-CM | POA: Diagnosis not present

## 2018-01-16 DIAGNOSIS — E0590 Thyrotoxicosis, unspecified without thyrotoxic crisis or storm: Secondary | ICD-10-CM | POA: Insufficient documentation

## 2018-01-16 DIAGNOSIS — R918 Other nonspecific abnormal finding of lung field: Secondary | ICD-10-CM | POA: Diagnosis not present

## 2018-01-16 DIAGNOSIS — C3490 Malignant neoplasm of unspecified part of unspecified bronchus or lung: Secondary | ICD-10-CM

## 2018-01-16 DIAGNOSIS — K449 Diaphragmatic hernia without obstruction or gangrene: Secondary | ICD-10-CM | POA: Insufficient documentation

## 2018-01-16 DIAGNOSIS — C3412 Malignant neoplasm of upper lobe, left bronchus or lung: Secondary | ICD-10-CM | POA: Diagnosis not present

## 2018-01-16 NOTE — Progress Notes (Signed)
At request of Shona Simpson, PA-C, pt's BP was checked in right arm, sitting. 207/88 with recheck of same reading. Pt was offered ED evaluation of hypertension. Pt refused. Pt was offered wheelchair. Pt refused.  Loma Sousa, RN BSN

## 2018-01-16 NOTE — Progress Notes (Deleted)
Radiation Oncology         [phone] ________________________________  Name: [name]        MRN: [id number]  Date of Service: 01/16/2018 DOB: June 11, 1941  VP:[id number], Denton Ar, MD  Curt Bears, MD     REFERRING PHYSICIAN: Curt Bears, MD   DIAGNOSIS: The primary encounter diagnosis was Recurrent non-small cell lung cancer ([place]). A diagnosis of Cancer of upper lobe of left lung ([place]) was also pertinent to this visit.   HISTORY OF PRESENT ILLNESS: [name] is a 77 y.o. female seen at the request of Dr. Julien Nordmann for a history of recurrent progressive Stage IA cT1a N0, M0 adenocarcinoma who underwent RUL lobectomy with Dr. Arlyce Dice in November 2009, followed by SBRT to a LUL nodule when recurrence was noted in 2013. She underwent SBRT to a prevascular node in March 2015, and subsequently began [place] in May 2015, which was ultimately discontinued due to intolerance. She had recurrence in the LUL in 2016 and underwent thermal ablation with Dr. Laurence Ferrari on 10/01/14. She has been followed since and in in June 2019, her tumor in the LUL was 4.1 x 4.8 cm, previously 3.7 x 3.7 cm and this encases the left upper lobe bronchi. Retro hilar LLL nodule was also measured at 2. 6 x 1.7 cm, previously 2.1 x 1.3 cm, and a 1.5 cm nodule in the lingula was noted and starting to incorporate into the dominant left upper lobe mass. 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 She has met with Dr. Julien Nordmann to review these results and has options of palliative radiotherapy versus palliative chemotherapy plus Keytruda. She comes today to discuss  radiotherapy options. Of note the patient did have an MI and had a complicated month long hospitalization in March 2019, she did have stent placement but has chosen to avoid plavix due to risk of bleeding.     PREVIOUS RADIATION THERAPY: Yes   09/16/2013-09/30/2013 SBRT Treatment:  SBRT  to a left mediastinal/prevascular lymph node 50 Gy completed over 10 fractions  10/11/2011-10/21/2011 SBRT Treatment: SBRT to a left upper lobe nodule to a total dose of 60 gray in 5 fractions  PAST MEDICAL HISTORY:  Past Medical History:  Diagnosis Date  . Anemia 09/2017  . Arthritis    back.and feet  . Cervical ca ([place]) dx'd 1988   surg only  . CKD (chronic kidney disease), stage III ([place])   . Complication of anesthesia    difficulty awakening, dysrhythmia post surgery, (prolonged sedation level)  . Coronary artery calcification seen on CT scan   . Dyspnea   . GERD (gastroesophageal reflux disease)   . Headache    occ. sinus type headaches  . History of hiatal hernia   . Hx of radiation therapy 09/16/13-09/30/13   prevascular lymph node  . Hyperlipidemia   . Hypertension   . Hyperthyroidism    dr Loanne Drilling- radioactive iodine treatment 2011- resolved  . Hypothyroidism   . LBBB (left bundle branch block)   . Lung cancer ([place])    Bilaterally, RUL lobectomy and spot involving both lungs- "cancer"  . Macular degeneration    bilateral  . Melanoma ([place])    left leg  . Radiation 10/11/11-10/21/11   Left upper lobe adenocarcinoma  . Tremor    hand- intention tremor       PAST SURGICAL HISTORY: Past Surgical History:  Procedure Laterality Date  . basal cell and  squamous cell skin cell area removed     new left lower eye lid done with skin graft   . CARDIAC CATHETERIZATION  09/21/2017  . CATARACT EXTRACTION, BILATERAL Bilateral   . COLONOSCOPY WITH PROPOFOL N/A 10/27/2016   Procedure: COLONOSCOPY WITH PROPOFOL;  Surgeon: Laurence Spates, MD;  Location: [facility];  Service: Endoscopy;  Laterality: N/A;  . conization for cervical dysplasia    . CORONARY STENT INTERVENTION N/A 09/21/2017   Procedure: CORONARY STENT INTERVENTION;  Surgeon: Belva Crome, MD;  Location: [facility];  Service: Cardiovascular;  Laterality: N/A;  . CORONARY/GRAFT ACUTE MI REVASCULARIZATION N/A  09/21/2017   Procedure: Coronary/Graft Acute MI Revascularization;  Surgeon: Belva Crome, MD;  Location: [facility];  Service: Cardiovascular;  Laterality: N/A;  . ESOPHAGOGASTRODUODENOSCOPY (EGD) WITH PROPOFOL N/A 09/28/2017   Procedure: ESOPHAGOGASTRODUODENOSCOPY (EGD) WITH PROPOFOL;  Surgeon: Arta Silence, MD;  Location: [facility];  Service: Endoscopy;  Laterality: N/A;  . ESOPHAGOGASTRODUODENOSCOPY (EGD) WITH PROPOFOL N/A 09/30/2017   Procedure: ESOPHAGOGASTRODUODENOSCOPY (EGD) WITH PROPOFOL;  Surgeon: Otis Brace, MD;  Location: [facility];  Service: Gastroenterology;  Laterality: N/A;  . esophogus stretching    . GIVENS CAPSULE STUDY N/A 10/06/2017   Procedure: GIVENS CAPSULE STUDY;  Surgeon: Arta Silence, MD;  Location: [facility];  Service: Endoscopy;  Laterality: N/A;  . INTRAVASCULAR ULTRASOUND/IVUS N/A 09/21/2017   Procedure: Intravascular Ultrasound/IVUS;  Surgeon: Belva Crome, MD;  Location: [facility];  Service: Cardiovascular;  Laterality: N/A;  . IR GENERIC HISTORICAL  09/11/2014   IR RADIOLOGIST EVAL & MGMT 09/11/2014 Jacqulynn Cadet, MD [facility] INTERV RAD  . LEFT HEART CATH AND CORONARY ANGIOGRAPHY N/A 09/21/2017   Procedure: LEFT HEART CATH AND CORONARY ANGIOGRAPHY;  Surgeon: Belva Crome, MD;  Location: [facility];  Service: Cardiovascular;  Laterality: N/A;  . LEFT HEART CATH AND CORONARY ANGIOGRAPHY N/A 09/21/2017   Procedure: LEFT HEART CATH AND CORONARY ANGIOGRAPHY;  Surgeon: Belva Crome, MD;  Location: [facility];  Service: Cardiovascular;  Laterality: N/A;  . radio frequency ablation on lung spot    . rul lobectomy  2010  . TONSILLECTOMY    . TONSILLECTOMY       FAMILY HISTORY:  Family History  Problem Relation Age of Onset  . Asthma Mother   . Heart disease Mother   . Thyroid disease Daughter        hypothyroidism  . Cancer Maternal Uncle        lung  . Cancer Maternal Grandfather        lung  . Diabetes  Neg Hx   . Coronary artery disease Neg Hx      SOCIAL HISTORY:  reports that she quit smoking about 42 years ago. Her smoking use included cigarettes. She has a 17.00 pack-year smoking history. She has never used smokeless tobacco. She reports that she drinks alcohol. She reports that she does not use drugs. The patient is widowed. Her husband was a former patient of Dr. Ida Rogue. She lives in [place], and is accompanied by her son.    ALLERGIES: Meperidine hcl; Morphine; Penicillins; Oxycodone-acetaminophen; Propoxyphene n-acetaminophen; and Tramadol hcl   MEDICATIONS:  Current Outpatient Medications  Medication Sig Dispense Refill  . acetaminophen (TYLENOL) 650 MG CR tablet Take 1,300 mg by mouth 2 (two) times daily.     Marland Kitchen albuterol (PROVENTIL) (2.5 MG/3ML) 0.083% nebulizer solution Take 3 mLs (2.5 mg total) by nebulization every 4 (four) hours as needed for wheezing (  use flutter valve). 75 mL 12  . arformoterol (BROVANA) 15 MCG/2ML NEBU Take 2 mLs (15 mcg total) by nebulization 2 (two) times daily. 120 mL   . atorvastatin (LIPITOR) 80 MG tablet Take 1 tablet (80 mg total) by mouth daily. 90 tablet 3  . Calcium Carbonate-Vitamin D3 (CALCIUM 600-D) 600-400 MG-UNIT TABS Take 2 tablets by mouth daily.     . ferrous sulfate 325 (65 FE) MG tablet Take 325 mg by mouth daily with breakfast.    . fluticasone (FLONASE) 50 MCG/ACT nasal spray Place 1 spray into both nostrils daily as needed for allergies.     Marland Kitchen gabapentin (NEURONTIN) 300 MG capsule Take 600 mg by mouth at bedtime.   2  . lidocaine (LIDODERM) 5 % Place 1 patch onto the skin daily. Remove & Discard patch within 12 hours or as directed by MD 30 patch 0  . losartan (COZAAR) 50 MG tablet Take 1 tablet (50 mg total) by mouth 2 (two) times daily. 180 tablet 3  . metoprolol tartrate (LOPRESSOR) 100 MG tablet Take 1 tablet (100 mg total) by mouth 2 (two) times daily.    . Multiple Vitamin (MULTIVITAMIN WITH MINERALS) TABS tablet Take 1  tablet by mouth daily.    . Multiple Vitamins-Minerals (PRESERVISION AREDS 2 PO) Take 1 capsule by mouth 2 (two) times daily.    . ondansetron (ZOFRAN) 8 MG tablet Take 1 tablet by mouth as needed.  1  . pantoprazole (PROTONIX) 40 MG tablet Take 1 tablet (40 mg total) by mouth 2 (two) times daily.    Marland Kitchen levothyroxine (SYNTHROID, LEVOTHROID) 88 MCG tablet     . nitroGLYCERIN (NITROSTAT) 0.4 MG SL tablet Place 1 tablet (0.4 mg total) under the tongue every 5 (five) minutes as needed for chest pain. (Patient not taking: Reported on 01/09/2018)  0   No current facility-administered medications for this encounter.      REVIEW OF SYSTEMS: On review of systems, the patient reports that she is doing okay but has noticed increasing shortness of breath with speaking or exertion. She denies any chest pain, shortness of breath, cough, fevers, chills, night sweats, unintended weight changes. She denies any bowel or bladder disturbances, and denies abdominal pain, nausea or vomiting. She denies any new musculoskeletal or joint aches or pains. A complete review of systems is obtained and is otherwise negative.     PHYSICAL EXAM:  Wt Readings from Last 3 Encounters:  01/16/18 126 lb 12.8 oz (57.5 kg)  01/09/18 125 lb 12.8 oz (57.1 kg)  11/24/17 127 lb (57.6 kg)   Temp Readings from Last 3 Encounters:  01/16/18 (!) 97.5 F (36.4 C) (Oral)  01/09/18 97.7 F (36.5 C) (Oral)  10/17/17 97.8 F (36.6 C)   BP Readings from Last 3 Encounters:  01/16/18 (!) 184/69  01/09/18 (!) 177/71  11/24/17 126/76   Pulse Readings from Last 3 Encounters:  01/16/18 72  01/09/18 65  11/24/17 74   Pain Assessment Pain Score: 0-No pain/10  In general this is a well appearing caucasian female in no acute distress. She is alert and oriented x4 and appropriate throughout the examination. HEENT reveals that the patient is normocephalic, atraumatic. EOMs are intact. Skin is intact without any evidence of gross lesions.  Cardiopulmonary assessment is negative for acute distress and she exhibits normal effort.    ECOG = 2  0 - Asymptomatic (Fully active, able to carry on all predisease activities without restriction)  1 - Symptomatic but completely ambulatory (  Restricted in physically strenuous activity but ambulatory and able to carry out work of a light or sedentary nature. For example, light housework, office work)  2 - Symptomatic, <50% in bed during the day (Ambulatory and capable of all self care but unable to carry out any work activities. Up and about more than 50% of waking hours)  3 - Symptomatic, >50% in bed, but not bedbound (Capable of only limited self-care, confined to bed or chair 50% or more of waking hours)  4 - Bedbound (Completely disabled. Cannot carry on any self-care. Totally confined to bed or chair)  5 - Death   Eustace Pen MM, Creech RH, Tormey DC, et al. [id number]). "Toxicity and response criteria of the [facility]". [place] Oncol. 5 (6): 649-55    LABORATORY DATA:  Lab Results  Component Value Date   WBC 6.2 01/05/2018   HGB 13.4 01/05/2018   HCT 40.9 01/05/2018   MCV 87.6 01/05/2018   PLT 192 01/05/2018   Lab Results  Component Value Date   NA 142 01/05/2018   K 4.4 01/05/2018   CL 103 01/05/2018   CO2 31 01/05/2018   Lab Results  Component Value Date   ALT 24 01/05/2018   AST 22 01/05/2018   ALKPHOS 60 01/05/2018   BILITOT 0.8 01/05/2018      RADIOGRAPHY: Ct Chest W Contrast  Result Date: 01/08/2018 CLINICAL DATA:  Non-small-cell lung cancer. EXAM: CT CHEST WITH CONTRAST TECHNIQUE: Multidetector CT imaging of the chest was performed during intravenous contrast administration. CONTRAST:  19m OMNIPAQUE IOHEXOL 300 MG/ML  SOLN COMPARISON:  09/04/2017 FINDINGS: Cardiovascular: Heart size upper normal. No pericardial effusion. Coronary artery calcification is evident. Atherosclerotic calcification is noted in the wall of the thoracic aorta.  Mediastinum/Nodes: No mediastinal lymphadenopathy. Stable appearance of normal short axis diameter lymph nodes in the AP window and precarinal space. No right hilar lymphadenopathy. Abnormal soft tissue again noted anterior left hilum. Small hiatal hernia. There is no axillary lymphadenopathy. Lungs/Pleura: Central left upper lobe mass has progressed in the interval, measuring 4.1 x 4.8 cm today compared to 3.7 x 3.7 cm previously. As before, lesion encases and attenuates left upper lobe bronchi. Retro hilar left lower lobe nodule (46:5) measures 2.6 x 1.7 cm today compared to 2.1 x 1.3 cm previously. Sub solid left lower lobe nodule described previously is similar in size today at 2.8 x 1.3 cm (53:5) although the dominant left upper lobe mass bulges and potentially crosses the major fissure into this lesion on today's study. The 1.5 cm nodule identified in the lingula on the prior study has become incorporated into the dominant left upper lobe mass. Scattered subcentimeter ground-glass nodules are again noted bilaterally. Adjacent 4 mm nodules in the right middle lobe (83:5) are new in the interval. 7 mm right middle lobe nodule (88:5) is new since prior study. These right middle lobe nodules are associated with small airway impaction and tree-in-bud opacity in the right middle lobe and may be related to atypical infection, but follow-up will be required. 7 mm left lower lobe nodule (114:5) is similar to prior. Additional tiny scattered pulmonary nodules are evident bilaterally. Upper Abdomen: Cortical scarring noted in the visualized portion of each upper kidney as are small renal cysts. Small left para-aortic lymph node medial to the left adrenal gland is stable. Musculoskeletal: Degenerative changes noted in the thoracic spine. Fracture nonunion of the manubrium and mid sternum is new in the interval and pathologic fracture of the  sternum cannot be excluded. IMPRESSION: 1. Interval progression of central left  upper lobe mass. Associated retro hilar left lower lobe nodule seen on the previous study has also progressed. 2. New right middle lobe pulmonary nodules, potentially related to atypical infection. Close attention on follow-up recommended as metastatic disease not excluded. 3. Multiple solid and sub solid small pulmonary nodules scattered bilaterally without substantial interval change. 4. Fractures identified in the manubrium and midsternum. Lucency around the sternal injury raises the question of pathologic fracture. 5.  Emphysema. (ICD10-J43.9) 6.  Aortic Atherosclerois (ICD10-170.0) Electronically Signed   By: Misty Stanley M.D.   On: 01/08/2018 08:36       IMPRESSION/PLAN: 1. Recurrent progressive Stage IA cT1a N0, M0, NSCLC, adenocarcinoma  RUL with EGFR mutation. Dr. Lisbeth Renshaw discusses the pathology findings and reviews the nature of progressive disease in a heavily pretreated radiation field. Dr. Lisbeth Renshaw does not feel that she would be a good candidate currently with the extent of disease for SBRT again given the risks of toxicity, but would rather offer a palliative course of radiotherapy. We discussed the risks, benefits, short, and long term effects of radiotherapy, which include long term risks, even of necrosis of the airway, and the patient is going to consider her options including palliative chemotherapy. Dr. Lisbeth Renshaw discusses the delivery and logistics of radiotherapy and anticipates a course of 3 weeks of radiotherapy with IMRT  if she elected to proceed. I will follow up with her on Thursday this week to see how she'd like to proceed. 2. Hypertension. The patient's blood pressure has been significantly elevated. She believes this to be white coat syndrome and anxiet from our discussion today. Her BP was repeated at the conclusion of the visit and was 207/88. She was encouraged to proceed to the ED but refused as she states she's asymptomatic. She was given precautions to proceed urgently if her BP  remains elevated and she assures me she will recheck this upon arriving home and take our recommendations to proceed with an ED visit if this remains elevated. 3. Goals of care. We discussed the options of palliative care involvement in her case regardless of how she proceeds with either chemotherapy, radiation, or no treatment at all. She is considering options for this service and we will discuss this further on Thursday.  In a visit lasting 120 minutes, greater than 50% of the time was spent face to face discussing her case, and coordinating the patient's care.   The above documentation reflects my direct findings during this shared patient visit. Please see the separate note by Dr. Lisbeth Renshaw on this date for the remainder of the patient's plan of care.    Carola Rhine, PAC

## 2018-01-18 ENCOUNTER — Telehealth: Payer: Self-pay | Admitting: Radiation Oncology

## 2018-01-18 NOTE — Telephone Encounter (Signed)
I spoke with the patient's son and she would like to proceed with palliative radiotherapy. She will come in for simulation tomorrow at 11 am.

## 2018-01-18 NOTE — Telephone Encounter (Signed)
I called and LM for the patient's son Gwynneth Macleod Truddie Crumble.) to find out what his mother has decided regarding our discussion from Tuesday.

## 2018-01-19 ENCOUNTER — Encounter: Payer: Self-pay | Admitting: *Deleted

## 2018-01-19 ENCOUNTER — Ambulatory Visit
Admission: RE | Admit: 2018-01-19 | Discharge: 2018-01-19 | Disposition: A | Discharge status: Home / Self Care | Payer: Medicare Other | Source: Ambulatory Visit | Attending: Radiation Oncology | Admitting: Radiation Oncology

## 2018-01-19 DIAGNOSIS — C3411 Malignant neoplasm of upper lobe, right bronchus or lung: Secondary | ICD-10-CM | POA: Diagnosis not present

## 2018-01-19 DIAGNOSIS — Z923 Personal history of irradiation: Secondary | ICD-10-CM | POA: Diagnosis not present

## 2018-01-19 DIAGNOSIS — Z510 Encounter for antineoplastic radiation therapy: Secondary | ICD-10-CM | POA: Insufficient documentation

## 2018-01-19 DIAGNOSIS — Z87891 Personal history of nicotine dependence: Secondary | ICD-10-CM | POA: Insufficient documentation

## 2018-01-19 DIAGNOSIS — E039 Hypothyroidism, unspecified: Secondary | ICD-10-CM | POA: Diagnosis not present

## 2018-01-19 DIAGNOSIS — Z85118 Personal history of other malignant neoplasm of bronchus and lung: Secondary | ICD-10-CM | POA: Diagnosis not present

## 2018-01-19 DIAGNOSIS — C3412 Malignant neoplasm of upper lobe, left bronchus or lung: Secondary | ICD-10-CM | POA: Diagnosis not present

## 2018-01-19 NOTE — Progress Notes (Signed)
[facility] Psychosocial Distress Screening Clinical Social Work  Clinical Social Work was referred by distress screening protocol.  The patient scored a 6 on the Psychosocial Distress Thermometer which indicates moderate distress. Clinical Social Worker contacted patient by phone to assess for distress and other psychosocial needs. Patient completed [facility] appointment today.  Patient reported no concerns, stated "this ain't my first rodeo".  CSW briefly reviewed support available, patient agreed to call as needs arise.  ONCBCN DISTRESS SCREENING 01/16/2018  Screening Type Initial Screening  Distress experienced in past week (1-10) 6  Physical Problem type Breathing  Other You can reach me by phone     Kennith Center, LCSW

## 2018-01-29 ENCOUNTER — Ambulatory Visit: Payer: Medicare Other | Admitting: Radiation Oncology

## 2018-01-29 DIAGNOSIS — C3411 Malignant neoplasm of upper lobe, right bronchus or lung: Secondary | ICD-10-CM | POA: Diagnosis not present

## 2018-01-29 DIAGNOSIS — C3412 Malignant neoplasm of upper lobe, left bronchus or lung: Secondary | ICD-10-CM | POA: Diagnosis not present

## 2018-01-29 DIAGNOSIS — Z87891 Personal history of nicotine dependence: Secondary | ICD-10-CM | POA: Diagnosis not present

## 2018-01-29 DIAGNOSIS — Z85118 Personal history of other malignant neoplasm of bronchus and lung: Secondary | ICD-10-CM | POA: Diagnosis not present

## 2018-01-29 DIAGNOSIS — Z510 Encounter for antineoplastic radiation therapy: Secondary | ICD-10-CM | POA: Diagnosis not present

## 2018-01-29 DIAGNOSIS — Z923 Personal history of irradiation: Secondary | ICD-10-CM | POA: Diagnosis not present

## 2018-01-30 ENCOUNTER — Ambulatory Visit
Admission: RE | Admit: 2018-01-30 | Discharge: 2018-01-30 | Disposition: A | Discharge status: Home / Self Care | Payer: Medicare Other | Source: Ambulatory Visit | Attending: Radiation Oncology | Admitting: Radiation Oncology

## 2018-01-30 DIAGNOSIS — Z87891 Personal history of nicotine dependence: Secondary | ICD-10-CM | POA: Diagnosis not present

## 2018-01-30 DIAGNOSIS — Z510 Encounter for antineoplastic radiation therapy: Secondary | ICD-10-CM | POA: Diagnosis not present

## 2018-01-30 DIAGNOSIS — Z923 Personal history of irradiation: Secondary | ICD-10-CM | POA: Diagnosis not present

## 2018-01-30 DIAGNOSIS — Z85118 Personal history of other malignant neoplasm of bronchus and lung: Secondary | ICD-10-CM | POA: Diagnosis not present

## 2018-01-30 DIAGNOSIS — C3412 Malignant neoplasm of upper lobe, left bronchus or lung: Secondary | ICD-10-CM | POA: Diagnosis not present

## 2018-01-30 DIAGNOSIS — C3411 Malignant neoplasm of upper lobe, right bronchus or lung: Secondary | ICD-10-CM | POA: Diagnosis not present

## 2018-01-31 ENCOUNTER — Ambulatory Visit
Admission: RE | Admit: 2018-01-31 | Discharge: 2018-01-31 | Disposition: A | Discharge status: Home / Self Care | Payer: Medicare Other | Source: Ambulatory Visit | Attending: Radiation Oncology | Admitting: Radiation Oncology

## 2018-01-31 DIAGNOSIS — C3412 Malignant neoplasm of upper lobe, left bronchus or lung: Secondary | ICD-10-CM | POA: Diagnosis not present

## 2018-01-31 DIAGNOSIS — Z923 Personal history of irradiation: Secondary | ICD-10-CM | POA: Diagnosis not present

## 2018-01-31 DIAGNOSIS — Z510 Encounter for antineoplastic radiation therapy: Secondary | ICD-10-CM | POA: Diagnosis not present

## 2018-01-31 DIAGNOSIS — C3411 Malignant neoplasm of upper lobe, right bronchus or lung: Secondary | ICD-10-CM | POA: Diagnosis not present

## 2018-01-31 DIAGNOSIS — Z87891 Personal history of nicotine dependence: Secondary | ICD-10-CM | POA: Diagnosis not present

## 2018-01-31 DIAGNOSIS — Z85118 Personal history of other malignant neoplasm of bronchus and lung: Secondary | ICD-10-CM | POA: Diagnosis not present

## 2018-02-01 ENCOUNTER — Ambulatory Visit
Admission: RE | Admit: 2018-02-01 | Discharge: 2018-02-01 | Disposition: A | Discharge status: Home / Self Care | Payer: Medicare Other | Source: Ambulatory Visit | Attending: Radiation Oncology | Admitting: Radiation Oncology

## 2018-02-01 DIAGNOSIS — Z923 Personal history of irradiation: Secondary | ICD-10-CM | POA: Diagnosis not present

## 2018-02-01 DIAGNOSIS — Z87891 Personal history of nicotine dependence: Secondary | ICD-10-CM | POA: Diagnosis not present

## 2018-02-01 DIAGNOSIS — C3491 Malignant neoplasm of unspecified part of right bronchus or lung: Secondary | ICD-10-CM | POA: Diagnosis not present

## 2018-02-01 DIAGNOSIS — Z510 Encounter for antineoplastic radiation therapy: Secondary | ICD-10-CM | POA: Diagnosis not present

## 2018-02-01 DIAGNOSIS — C3412 Malignant neoplasm of upper lobe, left bronchus or lung: Secondary | ICD-10-CM | POA: Diagnosis not present

## 2018-02-01 DIAGNOSIS — E039 Hypothyroidism, unspecified: Secondary | ICD-10-CM | POA: Diagnosis not present

## 2018-02-01 DIAGNOSIS — I10 Essential (primary) hypertension: Secondary | ICD-10-CM | POA: Diagnosis not present

## 2018-02-01 DIAGNOSIS — Z85118 Personal history of other malignant neoplasm of bronchus and lung: Secondary | ICD-10-CM | POA: Diagnosis not present

## 2018-02-01 DIAGNOSIS — C3411 Malignant neoplasm of upper lobe, right bronchus or lung: Secondary | ICD-10-CM | POA: Diagnosis not present

## 2018-02-02 ENCOUNTER — Ambulatory Visit
Admission: RE | Admit: 2018-02-02 | Discharge: 2018-02-02 | Disposition: A | Discharge status: Home / Self Care | Payer: Medicare Other | Source: Ambulatory Visit | Attending: Radiation Oncology | Admitting: Radiation Oncology

## 2018-02-02 DIAGNOSIS — Z923 Personal history of irradiation: Secondary | ICD-10-CM | POA: Diagnosis not present

## 2018-02-02 DIAGNOSIS — C3411 Malignant neoplasm of upper lobe, right bronchus or lung: Secondary | ICD-10-CM | POA: Diagnosis not present

## 2018-02-02 DIAGNOSIS — Z85118 Personal history of other malignant neoplasm of bronchus and lung: Secondary | ICD-10-CM | POA: Diagnosis not present

## 2018-02-02 DIAGNOSIS — Z87891 Personal history of nicotine dependence: Secondary | ICD-10-CM | POA: Diagnosis not present

## 2018-02-02 DIAGNOSIS — C3412 Malignant neoplasm of upper lobe, left bronchus or lung: Secondary | ICD-10-CM | POA: Diagnosis not present

## 2018-02-02 DIAGNOSIS — Z510 Encounter for antineoplastic radiation therapy: Secondary | ICD-10-CM | POA: Diagnosis not present

## 2018-02-05 ENCOUNTER — Ambulatory Visit
Admission: RE | Admit: 2018-02-05 | Discharge: 2018-02-05 | Disposition: A | Discharge status: Home / Self Care | Payer: Medicare Other | Source: Ambulatory Visit | Attending: Radiation Oncology | Admitting: Radiation Oncology

## 2018-02-05 DIAGNOSIS — C3412 Malignant neoplasm of upper lobe, left bronchus or lung: Secondary | ICD-10-CM | POA: Diagnosis not present

## 2018-02-05 DIAGNOSIS — Z85118 Personal history of other malignant neoplasm of bronchus and lung: Secondary | ICD-10-CM | POA: Diagnosis not present

## 2018-02-05 DIAGNOSIS — Z923 Personal history of irradiation: Secondary | ICD-10-CM | POA: Diagnosis not present

## 2018-02-05 DIAGNOSIS — Z510 Encounter for antineoplastic radiation therapy: Secondary | ICD-10-CM | POA: Diagnosis not present

## 2018-02-05 DIAGNOSIS — C3411 Malignant neoplasm of upper lobe, right bronchus or lung: Secondary | ICD-10-CM | POA: Diagnosis not present

## 2018-02-05 DIAGNOSIS — Z87891 Personal history of nicotine dependence: Secondary | ICD-10-CM | POA: Diagnosis not present

## 2018-02-06 ENCOUNTER — Ambulatory Visit
Admission: RE | Admit: 2018-02-06 | Discharge: 2018-02-06 | Disposition: A | Discharge status: Home / Self Care | Payer: Medicare Other | Source: Ambulatory Visit | Attending: Radiation Oncology | Admitting: Radiation Oncology

## 2018-02-06 DIAGNOSIS — C3411 Malignant neoplasm of upper lobe, right bronchus or lung: Secondary | ICD-10-CM | POA: Diagnosis not present

## 2018-02-06 DIAGNOSIS — C3412 Malignant neoplasm of upper lobe, left bronchus or lung: Secondary | ICD-10-CM | POA: Diagnosis not present

## 2018-02-06 DIAGNOSIS — Z923 Personal history of irradiation: Secondary | ICD-10-CM | POA: Diagnosis not present

## 2018-02-06 DIAGNOSIS — Z510 Encounter for antineoplastic radiation therapy: Secondary | ICD-10-CM | POA: Diagnosis not present

## 2018-02-06 DIAGNOSIS — Z87891 Personal history of nicotine dependence: Secondary | ICD-10-CM | POA: Diagnosis not present

## 2018-02-06 DIAGNOSIS — Z85118 Personal history of other malignant neoplasm of bronchus and lung: Secondary | ICD-10-CM | POA: Diagnosis not present

## 2018-02-07 ENCOUNTER — Ambulatory Visit
Admission: RE | Admit: 2018-02-07 | Discharge: 2018-02-07 | Disposition: A | Discharge status: Home / Self Care | Payer: Medicare Other | Source: Ambulatory Visit | Attending: Radiation Oncology | Admitting: Radiation Oncology

## 2018-02-07 DIAGNOSIS — Z87891 Personal history of nicotine dependence: Secondary | ICD-10-CM | POA: Diagnosis not present

## 2018-02-07 DIAGNOSIS — Z85118 Personal history of other malignant neoplasm of bronchus and lung: Secondary | ICD-10-CM | POA: Diagnosis not present

## 2018-02-07 DIAGNOSIS — Z923 Personal history of irradiation: Secondary | ICD-10-CM | POA: Diagnosis not present

## 2018-02-07 DIAGNOSIS — Z510 Encounter for antineoplastic radiation therapy: Secondary | ICD-10-CM | POA: Diagnosis not present

## 2018-02-07 DIAGNOSIS — C3412 Malignant neoplasm of upper lobe, left bronchus or lung: Secondary | ICD-10-CM | POA: Diagnosis not present

## 2018-02-07 DIAGNOSIS — C3411 Malignant neoplasm of upper lobe, right bronchus or lung: Secondary | ICD-10-CM | POA: Diagnosis not present

## 2018-02-08 ENCOUNTER — Ambulatory Visit
Admission: RE | Admit: 2018-02-08 | Discharge: 2018-02-08 | Disposition: A | Discharge status: Home / Self Care | Payer: Medicare Other | Source: Ambulatory Visit | Attending: Radiation Oncology | Admitting: Radiation Oncology

## 2018-02-08 DIAGNOSIS — Z87891 Personal history of nicotine dependence: Secondary | ICD-10-CM | POA: Insufficient documentation

## 2018-02-08 DIAGNOSIS — Z510 Encounter for antineoplastic radiation therapy: Secondary | ICD-10-CM | POA: Diagnosis not present

## 2018-02-08 DIAGNOSIS — C3411 Malignant neoplasm of upper lobe, right bronchus or lung: Secondary | ICD-10-CM | POA: Diagnosis not present

## 2018-02-08 DIAGNOSIS — C3412 Malignant neoplasm of upper lobe, left bronchus or lung: Secondary | ICD-10-CM | POA: Diagnosis not present

## 2018-02-09 ENCOUNTER — Ambulatory Visit
Admission: RE | Admit: 2018-02-09 | Discharge: 2018-02-09 | Disposition: A | Discharge status: Home / Self Care | Payer: Medicare Other | Source: Ambulatory Visit | Attending: Radiation Oncology | Admitting: Radiation Oncology

## 2018-02-09 DIAGNOSIS — Z87891 Personal history of nicotine dependence: Secondary | ICD-10-CM | POA: Diagnosis not present

## 2018-02-09 DIAGNOSIS — Z510 Encounter for antineoplastic radiation therapy: Secondary | ICD-10-CM | POA: Diagnosis not present

## 2018-02-09 DIAGNOSIS — C3411 Malignant neoplasm of upper lobe, right bronchus or lung: Secondary | ICD-10-CM | POA: Diagnosis not present

## 2018-02-09 DIAGNOSIS — C3412 Malignant neoplasm of upper lobe, left bronchus or lung: Secondary | ICD-10-CM | POA: Diagnosis not present

## 2018-02-12 ENCOUNTER — Ambulatory Visit
Admission: RE | Admit: 2018-02-12 | Discharge: 2018-02-12 | Disposition: A | Discharge status: Home / Self Care | Payer: Medicare Other | Source: Ambulatory Visit | Attending: Radiation Oncology | Admitting: Radiation Oncology

## 2018-02-12 DIAGNOSIS — C3412 Malignant neoplasm of upper lobe, left bronchus or lung: Secondary | ICD-10-CM | POA: Diagnosis not present

## 2018-02-12 DIAGNOSIS — C3411 Malignant neoplasm of upper lobe, right bronchus or lung: Secondary | ICD-10-CM | POA: Diagnosis not present

## 2018-02-12 DIAGNOSIS — Z510 Encounter for antineoplastic radiation therapy: Secondary | ICD-10-CM | POA: Diagnosis not present

## 2018-02-12 DIAGNOSIS — Z87891 Personal history of nicotine dependence: Secondary | ICD-10-CM | POA: Diagnosis not present

## 2018-02-13 ENCOUNTER — Ambulatory Visit
Admission: RE | Admit: 2018-02-13 | Discharge: 2018-02-13 | Disposition: A | Discharge status: Home / Self Care | Payer: Medicare Other | Source: Ambulatory Visit | Attending: Radiation Oncology | Admitting: Radiation Oncology

## 2018-02-13 DIAGNOSIS — Z510 Encounter for antineoplastic radiation therapy: Secondary | ICD-10-CM | POA: Diagnosis not present

## 2018-02-13 DIAGNOSIS — C3411 Malignant neoplasm of upper lobe, right bronchus or lung: Secondary | ICD-10-CM | POA: Diagnosis not present

## 2018-02-13 DIAGNOSIS — Z87891 Personal history of nicotine dependence: Secondary | ICD-10-CM | POA: Diagnosis not present

## 2018-02-13 DIAGNOSIS — C3412 Malignant neoplasm of upper lobe, left bronchus or lung: Secondary | ICD-10-CM | POA: Diagnosis not present

## 2018-02-14 ENCOUNTER — Ambulatory Visit
Admission: RE | Admit: 2018-02-14 | Discharge: 2018-02-14 | Disposition: A | Discharge status: Home / Self Care | Payer: Medicare Other | Source: Ambulatory Visit | Attending: Radiation Oncology | Admitting: Radiation Oncology

## 2018-02-14 DIAGNOSIS — Z87891 Personal history of nicotine dependence: Secondary | ICD-10-CM | POA: Diagnosis not present

## 2018-02-14 DIAGNOSIS — C3412 Malignant neoplasm of upper lobe, left bronchus or lung: Secondary | ICD-10-CM | POA: Diagnosis not present

## 2018-02-14 DIAGNOSIS — Z510 Encounter for antineoplastic radiation therapy: Secondary | ICD-10-CM | POA: Diagnosis not present

## 2018-02-14 DIAGNOSIS — C3411 Malignant neoplasm of upper lobe, right bronchus or lung: Secondary | ICD-10-CM | POA: Diagnosis not present

## 2018-02-15 ENCOUNTER — Ambulatory Visit
Admission: RE | Admit: 2018-02-15 | Discharge: 2018-02-15 | Disposition: A | Discharge status: Home / Self Care | Payer: Medicare Other | Source: Ambulatory Visit | Attending: Radiation Oncology | Admitting: Radiation Oncology

## 2018-02-15 DIAGNOSIS — C3411 Malignant neoplasm of upper lobe, right bronchus or lung: Secondary | ICD-10-CM | POA: Diagnosis not present

## 2018-02-15 DIAGNOSIS — Z87891 Personal history of nicotine dependence: Secondary | ICD-10-CM | POA: Diagnosis not present

## 2018-02-15 DIAGNOSIS — C3412 Malignant neoplasm of upper lobe, left bronchus or lung: Secondary | ICD-10-CM | POA: Diagnosis not present

## 2018-02-15 DIAGNOSIS — Z510 Encounter for antineoplastic radiation therapy: Secondary | ICD-10-CM | POA: Diagnosis not present

## 2018-02-16 ENCOUNTER — Encounter: Payer: Self-pay | Admitting: Radiation Oncology

## 2018-02-16 ENCOUNTER — Ambulatory Visit: Payer: Medicare Other

## 2018-02-16 ENCOUNTER — Ambulatory Visit
Admission: RE | Admit: 2018-02-16 | Discharge: 2018-02-16 | Disposition: A | Discharge status: Home / Self Care | Payer: Medicare Other | Source: Ambulatory Visit | Attending: Radiation Oncology | Admitting: Radiation Oncology

## 2018-02-16 DIAGNOSIS — C3411 Malignant neoplasm of upper lobe, right bronchus or lung: Secondary | ICD-10-CM | POA: Diagnosis not present

## 2018-02-16 DIAGNOSIS — Z87891 Personal history of nicotine dependence: Secondary | ICD-10-CM | POA: Diagnosis not present

## 2018-02-16 DIAGNOSIS — Z510 Encounter for antineoplastic radiation therapy: Secondary | ICD-10-CM | POA: Diagnosis not present

## 2018-02-16 DIAGNOSIS — C3412 Malignant neoplasm of upper lobe, left bronchus or lung: Secondary | ICD-10-CM | POA: Diagnosis not present

## 2018-02-19 ENCOUNTER — Ambulatory Visit: Payer: Medicare Other

## 2018-02-22 ENCOUNTER — Telehealth: Payer: Self-pay | Admitting: Radiation Oncology

## 2018-02-22 ENCOUNTER — Other Ambulatory Visit: Payer: Self-pay | Admitting: Radiation Oncology

## 2018-02-22 DIAGNOSIS — C3492 Malignant neoplasm of unspecified part of left bronchus or lung: Secondary | ICD-10-CM

## 2018-02-22 NOTE — Telephone Encounter (Signed)
I called the patient to let her know that we would get another CT scan prior to her next visit. Orders were placed. She also needs to see Dr. Julien Nordmann as well. We will coordinate his appointment to see her as well.

## 2018-03-13 ENCOUNTER — Telehealth: Payer: Self-pay | Admitting: *Deleted

## 2018-03-13 NOTE — Telephone Encounter (Signed)
CALLED PATIENT TO INFORM OF STAT LABS ON 03-15-18 AND HER CT ON 03-15-18, PT. TO BE NPO- 4 HRS. PRIOR TO TEST, TEST TO BE @ [facility], PT. TO GET RESULTS FROM A. PERKINS ON 03-19-18, LVM FOR A RETURN CALL

## 2018-03-15 ENCOUNTER — Encounter (HOSPITAL_COMMUNITY): Payer: Self-pay

## 2018-03-15 ENCOUNTER — Ambulatory Visit
Admission: RE | Admit: 2018-03-15 | Discharge: 2018-03-15 | Disposition: A | Discharge status: Home / Self Care | Payer: Medicare Other | Source: Ambulatory Visit | Attending: Radiation Oncology | Admitting: Radiation Oncology

## 2018-03-15 ENCOUNTER — Ambulatory Visit (HOSPITAL_COMMUNITY)
Admission: RE | Admit: 2018-03-15 | Discharge: 2018-03-15 | Disposition: A | Discharge status: Home / Self Care | Payer: Medicare Other | Source: Ambulatory Visit | Attending: Radiation Oncology | Admitting: Radiation Oncology

## 2018-03-15 DIAGNOSIS — C3492 Malignant neoplasm of unspecified part of left bronchus or lung: Secondary | ICD-10-CM | POA: Insufficient documentation

## 2018-03-15 DIAGNOSIS — I700 Atherosclerosis of aorta: Secondary | ICD-10-CM | POA: Diagnosis not present

## 2018-03-15 DIAGNOSIS — R911 Solitary pulmonary nodule: Secondary | ICD-10-CM | POA: Diagnosis not present

## 2018-03-15 DIAGNOSIS — C3412 Malignant neoplasm of upper lobe, left bronchus or lung: Secondary | ICD-10-CM | POA: Diagnosis not present

## 2018-03-15 DIAGNOSIS — J439 Emphysema, unspecified: Secondary | ICD-10-CM | POA: Diagnosis not present

## 2018-03-15 DIAGNOSIS — C3490 Malignant neoplasm of unspecified part of unspecified bronchus or lung: Secondary | ICD-10-CM | POA: Diagnosis not present

## 2018-03-15 LAB — BUN & CREATININE (CHCC)
BUN: 22 mg/dL (ref 8–23)
CREATININE: 1 mg/dL (ref 0.44–1.00)
GFR, Est AFR Am: >60 mL/min (ref >60)
GFR, Estimated: 53 mL/min — ABNORMAL LOW (ref >60)

## 2018-03-15 MED ORDER — IOHEXOL 300 MG/ML  SOLN
75.0000 mL | Freq: Once | INTRAMUSCULAR | Status: AC | PRN
  Administered 2018-03-15: 75 mL via INTRAVENOUS

## 2018-03-19 ENCOUNTER — Encounter: Payer: Self-pay | Admitting: Radiation Oncology

## 2018-03-19 ENCOUNTER — Other Ambulatory Visit: Payer: Self-pay

## 2018-03-19 ENCOUNTER — Ambulatory Visit
Admission: RE | Admit: 2018-03-19 | Discharge: 2018-03-19 | Disposition: A | Discharge status: Home / Self Care | Payer: Medicare Other | Source: Ambulatory Visit | Attending: Radiation Oncology | Admitting: Radiation Oncology

## 2018-03-19 VITALS — BP 173/78 | HR 62 | Temp 98.2°F | Resp 20 | Ht 63.5 in | Wt 125.6 lb

## 2018-03-19 DIAGNOSIS — Z923 Personal history of irradiation: Secondary | ICD-10-CM | POA: Insufficient documentation

## 2018-03-19 DIAGNOSIS — K449 Diaphragmatic hernia without obstruction or gangrene: Secondary | ICD-10-CM | POA: Diagnosis not present

## 2018-03-19 DIAGNOSIS — I700 Atherosclerosis of aorta: Secondary | ICD-10-CM | POA: Insufficient documentation

## 2018-03-19 DIAGNOSIS — Z79899 Other long term (current) drug therapy: Secondary | ICD-10-CM | POA: Diagnosis not present

## 2018-03-19 DIAGNOSIS — C3412 Malignant neoplasm of upper lobe, left bronchus or lung: Secondary | ICD-10-CM | POA: Insufficient documentation

## 2018-03-19 NOTE — Progress Notes (Signed)
Radiation Oncology         [phone] ________________________________  Name: [name] MRN: [id number]  Date of Service: 03/19/2018 DOB: 1941/04/25  Post Treatment Note  CC: Jessica Low, MD  Jessica Bears, MD  Diagnosis:  Stage IA cT1a N0, M0, NSCLC, adenocarcinoma  RUL with EGFR mutation,s/p resection with recurrence/[putative early  disease in the LUL.  Interval Since Last Radiation:  5 weeks   01/30/18-02/16/18: 37.5 Gy in 14 fractions to the LUL and hilar node  09/16/2013-09/30/2013 SBRT Treatment:  SBRT to a left mediastinal/prevascular lymph node 50 Gy completed over 10 fractions  10/11/2011-10/21/2011 SBRT Treatment: SBRT to a LUL nodule to a total dose of 60 gray in 5 fractions  Narrative:  [name] is a pleasant 77 y.o.  female with a history of Stage IA cT1a N0, M0 adenocarcinoma who underwent RUL lobectomy with Dr. Arlyce [name] in November 2009, followed by SBRT to a LUL nodule when new disease was noted in 2013. She underwent SBRT to a prevascular node in March 2015, and subsequently began Alpine in May 2015, which was ultimately discontinued due to intolerance. She had recurrence in the LUL in 2016 and underwent thermal ablation with Dr. Laurence [name] on 10/01/14. She has been followed since and in in June 2019, her tumor in the LUL was 4.1 x 4.8 cm, previously 3.7 x 3.7 cm and this encases the left upper lobe bronchi. Retro hilar LLL nodule was also measured at 2. 6 x 1.7 cm, previously 2.1 x 1.3 cm, and a 1.5 cm nodule in the lingula was noted and starting to incorporate into the dominant left upper lobe mass. 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 She was offered palliative radiotherapy versus palliative chemotherapy plus Keytruda. She elected to proceed with re-irradiation after counseling on the risks. She underwent this treatment a few weeks ago and comes today for follow up. Of note we were  wanting to follow up on a RML nodule and a CT chest was performed on 03/15/18 which revealed stability of the RML nodule, but several areas measuring up to 6 mm in the LLL, as well as a stable LLL nodule measuring about 12x10. In retrospect this appears from 05/2017 until now to be slowly increasing in size. She also has improvement in the LUL lesion from 48 x 41 mm in June 2019, now to 38 x 35 mm. Her left hilar nodule/prevascular node was also smaller, previously 26 x 17 mm, and now 23 x 16 mm.   On review of systems, the patient states she is doing well overall. She does have an occasional cough, and fatigue but denies any progressive shortness of breath, fevers or chills. She does still have some shortness of breath on exertion. No other complaints are verbalized.  ALLERGIES:  is allergic to meperidine hcl; morphine; penicillins; oxycodone-acetaminophen; propoxyphene n-acetaminophen; and tramadol hcl.  Meds: Current Outpatient Medications  Medication Sig Dispense Refill  . acetaminophen (TYLENOL) 650 MG CR tablet Take 1,300 mg by mouth 2 (two) times daily.     Marland Kitchen albuterol (PROVENTIL) (2.5 MG/3ML) 0.083% nebulizer solution Take 3 mLs (2.5 mg total) by nebulization every 4 (four) hours as needed for wheezing (use flutter valve). 75 mL 12  . arformoterol (BROVANA) 15 MCG/2ML NEBU Take 2 mLs (15 mcg total) by nebulization 2 (two) times daily. 120 mL   . atorvastatin (LIPITOR) 80 MG tablet Take 1 tablet (80 mg total)  by mouth daily. 90 tablet 3  . Calcium Carbonate-Vitamin D3 (CALCIUM 600-D) 600-400 MG-UNIT TABS Take 2 tablets by mouth daily.     . ferrous sulfate 325 (65 FE) MG tablet Take 325 mg by mouth daily with breakfast.    . fluticasone (FLONASE) 50 MCG/ACT nasal spray Place 1 spray into both nostrils daily as needed for allergies.     Marland Kitchen gabapentin (NEURONTIN) 300 MG capsule Take 600 mg by mouth at bedtime.   2  . levothyroxine (SYNTHROID, LEVOTHROID) 88 MCG tablet     . losartan (COZAAR) 50  MG tablet Take 1 tablet (50 mg total) by mouth 2 (two) times daily. 180 tablet 3  . metoprolol tartrate (LOPRESSOR) 100 MG tablet Take 1 tablet (100 mg total) by mouth 2 (two) times daily.    . Multiple Vitamin (MULTIVITAMIN WITH MINERALS) TABS tablet Take 1 tablet by mouth daily.    . Multiple Vitamins-Minerals (PRESERVISION AREDS 2 PO) Take 1 capsule by mouth 2 (two) times daily.    . ondansetron (ZOFRAN) 8 MG tablet Take 1 tablet by mouth as needed.  1  . pantoprazole (PROTONIX) 40 MG tablet Take 1 tablet (40 mg total) by mouth 2 (two) times daily.    Marland Kitchen lidocaine (LIDODERM) 5 % Place 1 patch onto the skin daily. Remove & Discard patch within 12 hours or as directed by MD (Patient not taking: Reported on 03/19/2018) 30 patch 0  . nitroGLYCERIN (NITROSTAT) 0.4 MG SL tablet Place 1 tablet (0.4 mg total) under the tongue every 5 (five) minutes as needed for chest pain. (Patient not taking: Reported on 01/09/2018)  0   No current facility-administered medications for this encounter.     Physical Findings:  height is 5' 3.5" (1.613 m) and weight is 125 lb 9.6 oz (57 kg). Her oral temperature is 98.2 F (36.8 C). Her blood pressure is 173/78 (abnormal) and her pulse is 62. Her respiration is 20 and oxygen saturation is 98%.  Pain Assessment Pain Score: 0-No pain/10 In general this is a well appearing caucasian female in no acute distress. She's alert and oriented x4 and appropriate throughout the examination. Cardiopulmonary assessment is negative for acute distress and she exhibits normal effort, RRR, no C/R/M. Chest is clear to ausculation bilaterally.  Lab Findings: Lab Results  Component Value Date   WBC 6.2 01/05/2018   HGB 13.4 01/05/2018   HCT 40.9 01/05/2018   MCV 87.6 01/05/2018   PLT 192 01/05/2018     Radiographic Findings: Ct Chest W Contrast  Result Date: 03/16/2018 CLINICAL DATA:  Recurrent non-small cell lung cancer, initially diagnosed in 2010 with right upper lobe resection.  Recurrent disease in the left upper lobe with thermal ablation 10/01/2014, radiation and chemotherapy. EXAM: CT CHEST WITH CONTRAST TECHNIQUE: Multidetector CT imaging of the chest was performed during intravenous contrast administration. CONTRAST:  69m OMNIPAQUE IOHEXOL 300 MG/ML  SOLN COMPARISON:  CT 01/05/2018 and 09/04/2017. FINDINGS: Cardiovascular: Diffuse atherosclerosis of the aorta, great vessels and coronary arteries. No acute vascular findings. Probable aortic valvular calcifications. The heart size is normal. There is no pericardial effusion. Mediastinum/Nodes: There are no enlarged mediastinal, hilar or axillary lymph nodes. There is a stable moderate size hiatal hernia. The trachea and thyroid gland appear unremarkable. Lungs/Pleura: There is no pleural effusion or pneumothorax. Patient is status post right upper lobe resection. Clustered nodularity in the right middle lobe is again noted, not significantly changed from the most recent study, measuring up to 5 mm on image  88/7. There are multiple ground-glass densities within the right lung. No new or enlarging right lung nodules are seen. The dominant central left upper lobe lesion has decreased in size, measuring 3.8 x 3.5 cm on image 45/7 (previously 4.8 x 4.1 cm). As before, this encases the central bronchi and crosses the major fissure. The suprahilar lesion in the superior segment of the left lower lobe is also slightly smaller, measuring 2.3 x 1.6 cm on image 44/7 (previously 2.6 x 1.7 cm). Ill-defined left upper lobe ground-glass opacity likely represents sequela of interval radiation therapy. There are several new clustered nodules in the left lower lobe, measuring up to 6 mm on image 58/7. A 12 x 10 mm left lower lobe nodule on image 84/7 has not significantly changed. Upper abdomen: The visualized upper abdomen appears stable without suspicious findings. Renal cortical scarring and cyst formation appear unchanged. Musculoskeletal/Chest  wall: There is no chest wall mass or suspicious osseous finding. There are stable bilateral thoracotomy defects. 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 Degenerative changes are present in the spine. IMPRESSION: 1. Compared with the most recent study, the dominant left upper mass and left lower lobe nodule have improved, consistent with response to therapy. 2. There are several new clustered nodules in the left lower lobe which could be metastatic or inflammatory. Clustered nodularity in the right middle lobe has not significantly changed from the most recent study, although is new from February. This suggests that these nodules may be inflammatory (atypical infection). Continued follow-up is necessary. 3. Other ground-glass and solid nodules in both lungs have not significantly changed. 4. No adenopathy or pleural effusion. 5. Coronary and Aortic Atherosclerosis (ICD10-I70.0) and Emphysema (ICD10-J43.9). Electronically Signed   By: Richardean Sale M.D.   On: 03/16/2018 11:28    Impression/Plan: 1. Stage IA cT1a N0, M0, NSCLC, adenocarcinoma  RUL with EGFR mutation,s/p resection with recurrence/putative early  disease in the LUL. I spent time with the patient and her son and we reviewed the findings from her recent CT imaging. Dr. Lisbeth Renshaw will also review her imaging, but we will continue to follow the lesion in the RML, and in the lesions in the LLL. We will repeat her scan in 3 months. Otherwise her LUL and left hilar areas are improved. We will present her case in conference after her next scan as well to make sure Dr. Julien Nordmann is included in her care.     Carola Rhine, PAC

## 2018-03-20 ENCOUNTER — Other Ambulatory Visit: Payer: Self-pay | Admitting: Radiation Oncology

## 2018-03-20 DIAGNOSIS — C3412 Malignant neoplasm of upper lobe, left bronchus or lung: Secondary | ICD-10-CM

## 2018-03-20 NOTE — Addendum Note (Signed)
Encounter addended by: Hayden Pedro, PA-C on: 03/20/2018 7:42 AM  Actions taken: Sign clinical note, Delete clinical note

## 2018-03-20 NOTE — Progress Notes (Signed)
Attestation signed by Kyung Rudd, MD at 01/17/2018 11:21 AM     Please see the note from Shona Simpson, PA-C from today's visit for more details of today's encounter.  I have personally performed a face to face diagnostic evaluation on this patient and devised the following assessment and plan.     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  She has received 2 courses previously of stereotactic body radiation treatment and also underwent an ablation through interventional radiology.  Given this history as well as the current extent of recurrence, I do not believe that a additional course of stereotactic body radiation treatment would be feasible without substantial risk of toxicity.  I do believe that a palliative 3-week course of treatment would be a consideration and likely would be well-tolerated.  We discussed the rationale of such a treatment including the possible benefit as well as side effects and risks in detail.  All of the patient's questions were answered and the patient did have her son with her today who is going to help her further discuss her options with her family.  We did discuss the possibility of proceeding with chemotherapy as well, especially given her prior treatment, but she has concerns about how well this would be tolerated.  We therefore discussed the possible 3-week course of treatment versus further observation, again discussing the pros and cons of each approach.  The patient wishes to think about her decision.  She will let us know if she does wish to proceed with radiation treatment in the fairly near future.     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, original stage I     Kyung Rudd, MD           Expand All Collapse All            Expand widget buttonCollapse widget button    Show:Clear all   ManualTemplateCopied  Added by:     [name] Pedro,  PA-C   Hover for detailscustomization button                                                                                                                                                 untitled image  Radiation Oncology         [id number]) [phone]  ________________________________     Name: [name]        MRN: [id number]   Date of Service: 01/16/2018 DOB: January 29, 1941     XN:[id number], [place] Ar, MD  Curt Bears, MD        REFERRING PHYSICIAN: Curt Bears, MD        DIAGNOSIS: The primary encounter diagnosis was Recurrent non-small cell lung cancer ([place]). A diagnosis of Cancer of upper lobe of left lung ([place]) was also  pertinent to this visit.      HISTORY OF PRESENT ILLNESS: [name] is a 77 y.o. female seen at the request of Dr. Julien Nordmann for a history of recurrent progressive Stage IA cT1a N0, M0 adenocarcinoma who underwent RUL lobectomy with Dr. Arlyce Dice in November 2009, followed by SBRT to a LUL nodule when recurrence was noted in 2013. She underwent SBRT to a prevascular node in March 2015, and subsequently began [place] in May 2015, which was ultimately discontinued due to intolerance. She had recurrence in the LUL in 2016 and underwent thermal ablation with Dr. Laurence Ferrari on 10/01/14. She has been followed since and in in June 2019, her tumor in the LUL was 4.1 x 4.8 cm, previously 3.7 x 3.7 cm and this encases the left upper lobe bronchi. Retro hilar LLL nodule was also measured at 2. 6 x 1.7 cm, previously 2.1 x 1.3 cm, and a 1.5 cm nodule in the lingula was noted and starting to incorporate into the dominant left upper lobe mass. 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 She has met with Dr. Julien Nordmann to review these results and has options of  palliative radiotherapy versus palliative chemotherapy plus Keytruda. She comes today to discuss  radiotherapy options. Of note the patient did have an MI and had a complicated month long hospitalization in March 2019, she did have stent placement but has chosen to avoid plavix due to risk of bleeding.            PREVIOUS RADIATION THERAPY: Yes      09/16/2013-09/30/2013 SBRT Treatment:   SBRT to a left mediastinal/prevascular lymph node 50 Gy completed over 10 fractions     10/11/2011-10/21/2011 SBRT Treatment:  SBRT to a left upper lobe nodule to a total dose of 60 gray in 5 fractions     PAST MEDICAL HISTORY:        Past Medical History:    Diagnosis   Date    .   Anemia   09/2017    .   Arthritis            back.and feet    .   Cervical ca ([place])   dx'd 1988        surg only    .   CKD (chronic kidney disease), stage III ([place])        .   Complication of anesthesia            difficulty awakening, dysrhythmia post surgery, (prolonged sedation level)    .   Coronary artery calcification seen on CT scan        .   Dyspnea        .   GERD (gastroesophageal reflux disease)        .   Headache            occ. sinus type headaches    .   History of hiatal hernia        .   Hx of radiation therapy   09/16/13-09/30/13        prevascular lymph node    .   Hyperlipidemia        .   Hypertension        .   Hyperthyroidism            dr Loanne Drilling- radioactive iodine treatment 2011- resolved    .   Hypothyroidism        .  LBBB (left bundle branch block)        .   Lung cancer ([place])            Bilaterally, RUL lobectomy and spot involving both lungs- "cancer"    .   Macular degeneration            bilateral    .   Melanoma ([place])            left leg    .   Radiation   10/11/11-10/21/11        Left upper lobe  adenocarcinoma    .   Tremor            hand- intention tremor              PAST SURGICAL HISTORY:        Past Surgical History:    Procedure   Laterality   Date    .   basal cell and squamous cell skin cell area removed                new left lower eye lid done with skin graft     .   CARDIAC CATHETERIZATION       09/21/2017    .   CATARACT EXTRACTION, BILATERAL   Bilateral        .   COLONOSCOPY WITH PROPOFOL   N/A   10/27/2016        Procedure: COLONOSCOPY WITH PROPOFOL;  Surgeon: Laurence Spates, MD;  Location: [facility];  Service: Endoscopy;  Laterality: N/A;    .   conization for cervical dysplasia            .   CORONARY STENT INTERVENTION   N/A   09/21/2017        Procedure: CORONARY STENT INTERVENTION;  Surgeon: Belva Crome, MD;  Location: [place] [facility];  Service: Cardiovascular;  Laterality: N/A;    .   CORONARY/GRAFT ACUTE MI REVASCULARIZATION   N/A   09/21/2017        Procedure: Coronary/Graft Acute MI Revascularization;  Surgeon: Belva Crome, MD;  Location: [place] [facility];  Service: Cardiovascular;  Laterality: N/A;    .   ESOPHAGOGASTRODUODENOSCOPY (EGD) WITH PROPOFOL   N/A   09/28/2017        Procedure: ESOPHAGOGASTRODUODENOSCOPY (EGD) WITH PROPOFOL;  Surgeon: Arta Silence, MD;  Location: [place];  Service: Endoscopy;  Laterality: N/A;    .   ESOPHAGOGASTRODUODENOSCOPY (EGD) WITH PROPOFOL   N/A   09/30/2017        Procedure: ESOPHAGOGASTRODUODENOSCOPY (EGD) WITH PROPOFOL;  Surgeon: Otis Brace, MD;  Location: [facility];  Service: Gastroenterology;  Laterality: N/A;    .   esophogus stretching            .   GIVENS CAPSULE STUDY   N/A   10/06/2017        Procedure: GIVENS CAPSULE STUDY;  Surgeon: Arta Silence, MD;  Location: [facility] ENDOSCOPY;  Service: Endoscopy;  Laterality: N/A;    .   INTRAVASCULAR  ULTRASOUND/IVUS   N/A   09/21/2017        Procedure: Intravascular Ultrasound/IVUS;  Surgeon: Belva Crome, MD;  Location: [place] [facility];  Service: Cardiovascular;  Laterality: N/A;    .   IR GENERIC HISTORICAL       09/11/2014        IR RADIOLOGIST EVAL & MGMT 09/11/2014 Jacqulynn Cadet, MD [facility] INTERV RAD    .  LEFT HEART CATH AND CORONARY ANGIOGRAPHY   N/A   09/21/2017        Procedure: LEFT HEART CATH AND CORONARY ANGIOGRAPHY;  Surgeon: Belva Crome, MD;  Location: [place] [facility];  Service: Cardiovascular;  Laterality: N/A;    .   LEFT HEART CATH AND CORONARY ANGIOGRAPHY   N/A   09/21/2017        Procedure: LEFT HEART CATH AND CORONARY ANGIOGRAPHY;  Surgeon: Belva Crome, MD;  Location: [place] [facility];  Service: Cardiovascular;  Laterality: N/A;    .   radio frequency ablation on lung spot            .   rul lobectomy       2010    .   TONSILLECTOMY            .   TONSILLECTOMY                     FAMILY HISTORY:         Family History    Problem   Relation   Age of Onset    .   Asthma   Mother        .   Heart disease   Mother        .   Thyroid disease   Daughter                hypothyroidism    .   Cancer   Maternal Uncle                lung    .   Cancer   Maternal Grandfather                lung    .   Diabetes   Neg Hx        .   Coronary artery disease   Neg Hx                 SOCIAL HISTORY:  reports that she quit smoking about 42 years ago. Her smoking use included cigarettes. She has a 17.00 pack-year smoking history. She has never used smokeless tobacco. She reports that she drinks alcohol. She reports that she does not use drugs. The patient is widowed. Her husband was a former patient of Dr. Ida Rogue. She lives in [place], and is accompanied by her son.         ALLERGIES: Meperidine  hcl; Morphine; Penicillins; Oxycodone-acetaminophen; Propoxyphene n-acetaminophen; and Tramadol hcl        MEDICATIONS:          Current Outpatient Medications    Medication   Sig   Dispense   Refill    .   acetaminophen (TYLENOL) 650 MG CR tablet   Take 1,300 mg by mouth 2 (two) times daily.             Marland Kitchen   albuterol (PROVENTIL) (2.5 MG/3ML) 0.083% nebulizer solution   Take 3 mLs (2.5 mg total) by nebulization every 4 (four) hours as needed for wheezing (use flutter valve).   75 mL   12    .   arformoterol (BROVANA) 15 MCG/2ML NEBU   Take 2 mLs (15 mcg total) by nebulization 2 (two) times daily.   120 mL        .   atorvastatin (LIPITOR) 80 MG tablet   Take 1 tablet (80 mg total) by mouth daily.   [place]  tablet   3    .   Calcium Carbonate-Vitamin D3 (CALCIUM 600-D) 600-400 MG-UNIT TABS   Take 2 tablets by mouth daily.             .   ferrous sulfate 325 (65 FE) MG tablet   Take 325 mg by mouth daily with breakfast.            .   fluticasone (FLONASE) 50 MCG/ACT nasal spray   Place 1 spray into both nostrils daily as needed for allergies.             Marland Kitchen   gabapentin (NEURONTIN) 300 MG capsule   Take 600 mg by mouth at bedtime.        2    .   lidocaine (LIDODERM) 5 %   Place 1 patch onto the skin daily. Remove & Discard patch within 12 hours or as directed by MD   30 patch   0    .   losartan (COZAAR) 50 MG tablet   Take 1 tablet (50 mg total) by mouth 2 (two) times daily.   180 tablet   3    .   metoprolol tartrate (LOPRESSOR) 100 MG tablet   Take 1 tablet (100 mg total) by mouth 2 (two) times daily.            .   Multiple Vitamin (MULTIVITAMIN WITH MINERALS) TABS tablet   Take 1 tablet by mouth daily.            .   Multiple Vitamins-Minerals (PRESERVISION AREDS 2 PO)   Take 1 capsule by mouth 2 (two) times daily.            .   ondansetron (ZOFRAN) 8  MG tablet   Take 1 tablet by mouth as needed.       1    .   pantoprazole (PROTONIX) 40 MG tablet   Take 1 tablet (40 mg total) by mouth 2 (two) times daily.            Marland Kitchen   levothyroxine (SYNTHROID, LEVOTHROID) 88 MCG tablet                .   nitroGLYCERIN (NITROSTAT) 0.4 MG SL tablet   Place 1 tablet (0.4 mg total) under the tongue every 5 (five) minutes as needed for chest pain. (Patient not taking: Reported on 01/09/2018)       0        No current facility-administered medications for this encounter.              REVIEW OF SYSTEMS: On review of systems, the patient reports that she is doing okay but has noticed increasing shortness of breath with speaking or exertion. She denies any chest pain, shortness of breath, cough, fevers, chills, night sweats, unintended weight changes. She denies any bowel or bladder disturbances, and denies abdominal pain, nausea or vomiting. She denies any new musculoskeletal or joint aches or pains. A complete review of systems is obtained and is otherwise negative.           PHYSICAL EXAM:       Wt Readings from Last 3 Encounters:    01/16/18   126 lb 12.8 oz (57.5 kg)    01/09/18   125 lb 12.8 oz (57.1 kg)    11/24/17   127 lb (57.6 kg)           Temp Readings from Last 3 Encounters:  01/16/18   (!) 97.5 F (36.4 C) (Oral)    01/09/18   97.7 F (36.5 C) (Oral)    10/17/17   97.8 F (36.6 C)           BP Readings from Last 3 Encounters:    01/16/18   (!) 184/69    01/09/18   (!) 177/71    11/24/17   126/76           Pulse Readings from Last 3 Encounters:    01/16/18   72    01/09/18   65    11/24/17   74       Pain Assessment  Pain Score: 0-No pain/10     In general this is a well appearing caucasian female in no acute distress. She is alert and oriented x4 and appropriate throughout the examination. HEENT reveals that the  patient is normocephalic, atraumatic. EOMs are intact. Skin is intact without any evidence of gross lesions. Cardiopulmonary assessment is negative for acute distress and she exhibits normal effort.         ECOG = 2     0 - Asymptomatic (Fully active, able to carry on all predisease activities without restriction)     1 - Symptomatic but completely ambulatory (Restricted in physically strenuous activity but ambulatory and able to carry out work of a light or sedentary nature. For example, light housework, office work)     2 - Symptomatic, <50% in bed during the day (Ambulatory and capable of all self care but unable to carry out any work activities. Up and about more than 50% of waking hours)     3 - Symptomatic, >50% in bed, but not bedbound (Capable of only limited self-care, confined to bed or chair 50% or more of waking hours)     4 - Bedbound (Completely disabled. Cannot carry on any self-care. Totally confined to bed or chair)     5 - Death      Eustace Pen MM, Creech RH, Tormey DC, et al. [phone]). "Toxicity and response criteria of the [facility]". [place] Oncol. 5 (6): 649-55           LABORATORY DATA:   Recent Labs                                                                                              Recent Labs                                                                                 Recent Labs  RADIOGRAPHY:   Imaging Results                        IMPRESSION/PLAN: 1.         Recurrent progressive Stage IA cT1a N0, M0, NSCLC, adenocarcinoma  LUL with EGFR mutation. Dr. Lisbeth Renshaw discusses the pathology findings and reviews the nature of progressive disease  in a heavily pretreated radiation field. Dr. Lisbeth Renshaw does not feel that she would be a good candidate currently with the extent of disease for SBRT again given the risks of toxicity, but would rather offer a palliative course of radiotherapy. We discussed the risks, benefits, short, and long term effects of radiotherapy, which include long term risks, even of necrosis of the airway, and the patient is going to consider her options including palliative chemotherapy. Dr. Lisbeth Renshaw discusses the delivery and logistics of radiotherapy and anticipates a course of 3 weeks of radiotherapy with IMRT  if she elected to proceed. I will follow up with her on Thursday this week to see how she'd like to proceed.  2.         Hypertension. The patient's blood pressure has been significantly elevated. She believes this to be white coat syndrome and anxiet from our discussion today. Her BP was repeated at the conclusion of the visit and was 207/88. She was encouraged to proceed to the ED but refused as she states she's asymptomatic. She was given precautions to proceed urgently if her BP remains elevated and she assures me she will recheck this upon arriving home and take our recommendations to proceed with an ED visit if this remains elevated.  3.         Goals of care. We discussed the options of palliative care involvement in her case regardless of how she proceeds with either chemotherapy, radiation, or no treatment at all. She is considering options for this service and we will discuss this further on Thursday.     In a visit lasting 120 minutes, greater than 50% of the time was spent face to face discussing her case, and coordinating the patient's care.        The above documentation reflects my direct findings during this shared patient visit. Please see the separate note by Dr. Lisbeth Renshaw on this date for the remainder of the patient's plan of care.     untitled image     Carola Rhine, PAC               Cosigned by: Kyung Rudd, MD at 01/17/2018 11:21 AM   Electronically signed by [name] Pedro, PA-C at 01/17/2018  6:40 AM Electronically signed by Kyung Rudd, MD at 01/17/2018 11:21 AM

## 2018-03-27 NOTE — Progress Notes (Signed)
  Radiation Oncology         [phone] ________________________________  Name: [name] MRN: [id number]  Date: 02/16/2018  DOB: [birth year]  End of Treatment Note  Diagnosis:   77 y.o. female with Recurrent progressive Stage IA, cT1aN0M0 NSCLC, adenocarcinoma of the LUL with EGFR mutation     Indication for treatment:  palliative  Radiation treatment dates:   01/30/2018 - 02/16/2018  Site/dose:   Left Lung / 35 Gy in 14 fractions  Beams/energy:   3D / 6X Photon  Narrative: The patient tolerated radiation treatment relatively well.  She experienced increased fatigue, mild odynophagia, dry cough, and shortness of breath towards the end of treatment. She denies any hemoptysis.   Plan: The patient has completed radiation treatment. The patient will return to radiation oncology clinic for routine followup in one month. I advised them to call or return sooner if they have any questions or concerns related to their recovery or treatment.  ------------------------------------------------  Jodelle Gross, MD, PhD  This document serves as a record of services personally performed by Kyung Rudd, MD. It was created on his behalf by Rae Lips, a trained medical scribe. The creation of this record is based on the scribe's personal observations and the provider's statements to them. This document has been checked and approved by the attending provider.

## 2018-04-05 ENCOUNTER — Ambulatory Visit (INDEPENDENT_AMBULATORY_CARE_PROVIDER_SITE_OTHER): Payer: Medicare Other | Admitting: Cardiology

## 2018-04-05 VITALS — BP 160/80 | HR 65 | Ht 63.5 in | Wt 126.0 lb

## 2018-04-05 DIAGNOSIS — I2510 Atherosclerotic heart disease of native coronary artery without angina pectoris: Secondary | ICD-10-CM | POA: Diagnosis not present

## 2018-04-05 DIAGNOSIS — I10 Essential (primary) hypertension: Secondary | ICD-10-CM

## 2018-04-05 DIAGNOSIS — R0989 Other specified symptoms and signs involving the circulatory and respiratory systems: Secondary | ICD-10-CM

## 2018-04-05 MED ORDER — AMLODIPINE BESYLATE 2.5 MG PO TABS: 2.5000 mg | ORAL_TABLET | Freq: Every day | ORAL | 3 refills | Status: DC

## 2018-04-05 NOTE — Patient Instructions (Addendum)
Medication Instructions:   START TAKING AMLODIPINE 2.5 MG ONCE DAILY     Follow-Up:  3 WEEKS WITH OUR PHARMACIST IN [facility]  4 MONTHS WITH DR NELSON--SCHEDULED YOU TO SEE DR Meda Coffee ON Wednesday  08/08/17 AT 9:40 AM       If you need a refill on your cardiac medications before your next appointment, please call your pharmacy. \

## 2018-04-05 NOTE — Progress Notes (Signed)
Cardiology Office Note    Date:  04/05/2018   ID:  [name], DOB 12/20/1940, MRN [id number]  PCP:  Wenda Low, MD  Cardiologist:  Dr. Meda Coffee   Chief Complaint: 3 months follow up  History of Present Illness:   [name] is a 77 y.o. female PMH ofLBBB,HTN, probable CKD stage III, carotid ultrasound 2014 showing 1-39% stenosis, hypothyroidism, LBBB, GERD,nonsmall cell lung cancer (s/p RULectomy 2009, radiation to LUL nodule 2013, radiation to lymphadenopathy 2015, Tarceva rx, thermal ablation of recurrent LUL adenocarcinoma 2016), arthritis, cervical CA 1988 s/p surgeryand recent CAD presents for follow up.  Coronary CTA with FFR in 09/2017 was abnormal. Cath 09/21/17 showed segmental proximal to mid 90% RCA stenosis s/p overlapping Synergy 2.5 mm stents. Hospital course complicated by Afib RVR then Vfib arrest. Required 6 mins of CPR until ROSC. She was intubated and brought back to the cath lab. Relook cath with Dr. Tamala Julian noted acute vessel closure with total distal occlusion of the implanted stents with possible edge dissection. She was restented from the distal margin of the previous stents into the distal vessel. Able to wean extubate the following day.Hospital course further complicated by the development of melena, hemorrhagic anemia, and multiple transfusions. Bleeding was related to dual antiplatelet therapy on top of gastric ulcers, colonic ulcers, and multiple small bowel AVM/bleeding lesions. Ultimately all antiplatelet therapy was discontinued, bleeding stopped, and she is being discharged to [facility] for rehab before going home. P2 Y 12 on Plavix monotherapy was less than10,suggestinghyper-responsiveness to the medication. At the patient's request Plavix was discontinued. Once stable and hemoglobin has recovered, it may be wise to consider aspirin 81 mg 3 times per week. Seen in May 2019 for discussion of antiplatelet therapy: aspirin 3  times per week, however patient decided not to take it.  Today she is coming after 3 months, she states that she overall feels well and has no chest pain and has stable shortness of breath.  She had a follow-up cancer screening chest CT that showed progression of her lung cancer, 1 of the tumors obstructing her airways, she just underwent 3-week radiation therapy for this and is awaiting follow-up scans.  She still does not take aspirin as she is afraid that she might bleed. She denies any palpitations no lower extremity edema.  She has noticed that her blood pressure has been elevated lately.  Past Medical History:  Diagnosis Date  . Anemia 09/2017  . Arthritis    back.and feet  . Cervical ca ([place]) dx'd 1988   surg only  . CKD (chronic kidney disease), stage III ([place])   . Complication of anesthesia    difficulty awakening, dysrhythmia post surgery, (prolonged sedation level)  . Coronary artery calcification seen on CT scan   . Dyspnea   . GERD (gastroesophageal reflux disease)   . Headache    occ. sinus type headaches  . History of hiatal hernia   . Hx of radiation therapy 09/16/13-09/30/13   prevascular lymph node  . Hyperlipidemia   . Hypertension   . Hyperthyroidism    dr Loanne Drilling- radioactive iodine treatment 2011- resolved  . Hypothyroidism   . LBBB (left bundle branch block)   . Lung cancer ([place])    Bilaterally, RUL lobectomy and spot involving both lungs- "cancer"  . Macular degeneration    bilateral  . Melanoma ([place])    left leg  . Radiation 10/11/11-10/21/11   Left upper lobe adenocarcinoma  . Tremor  hand- intention tremor    Past Surgical History:  Procedure Laterality Date  . basal cell and squamous cell skin cell area removed     new left lower eye lid done with skin graft   . CARDIAC CATHETERIZATION  09/21/2017  . CATARACT EXTRACTION, BILATERAL Bilateral   . COLONOSCOPY WITH PROPOFOL N/A 10/27/2016   Procedure: COLONOSCOPY WITH PROPOFOL;  Surgeon: Laurence Spates, MD;  Location: [facility];  Service: Endoscopy;  Laterality: N/A;  . conization for cervical dysplasia    . CORONARY STENT INTERVENTION N/A 09/21/2017   Procedure: CORONARY STENT INTERVENTION;  Surgeon: Belva Crome, MD;  Location: [facility];  Service: Cardiovascular;  Laterality: N/A;  . CORONARY/GRAFT ACUTE MI REVASCULARIZATION N/A 09/21/2017   Procedure: Coronary/Graft Acute MI Revascularization;  Surgeon: Belva Crome, MD;  Location: [facility];  Service: Cardiovascular;  Laterality: N/A;  . ESOPHAGOGASTRODUODENOSCOPY (EGD) WITH PROPOFOL N/A 09/28/2017   Procedure: ESOPHAGOGASTRODUODENOSCOPY (EGD) WITH PROPOFOL;  Surgeon: Arta Silence, MD;  Location: [facility];  Service: Endoscopy;  Laterality: N/A;  . ESOPHAGOGASTRODUODENOSCOPY (EGD) WITH PROPOFOL N/A 09/30/2017   Procedure: ESOPHAGOGASTRODUODENOSCOPY (EGD) WITH PROPOFOL;  Surgeon: Otis Brace, MD;  Location: [facility];  Service: Gastroenterology;  Laterality: N/A;  . esophogus stretching    . GIVENS CAPSULE STUDY N/A 10/06/2017   Procedure: GIVENS CAPSULE STUDY;  Surgeon: Arta Silence, MD;  Location: [facility];  Service: Endoscopy;  Laterality: N/A;  . INTRAVASCULAR ULTRASOUND/IVUS N/A 09/21/2017   Procedure: Intravascular Ultrasound/IVUS;  Surgeon: Belva Crome, MD;  Location: [facility];  Service: Cardiovascular;  Laterality: N/A;  . IR GENERIC HISTORICAL  09/11/2014   IR RADIOLOGIST EVAL & MGMT 09/11/2014 Jacqulynn Cadet, MD [facility] INTERV RAD  . LEFT HEART CATH AND CORONARY ANGIOGRAPHY N/A 09/21/2017   Procedure: LEFT HEART CATH AND CORONARY ANGIOGRAPHY;  Surgeon: Belva Crome, MD;  Location: [facility];  Service: Cardiovascular;  Laterality: N/A;  . LEFT HEART CATH AND CORONARY ANGIOGRAPHY N/A 09/21/2017   Procedure: LEFT HEART CATH AND CORONARY ANGIOGRAPHY;  Surgeon: Belva Crome, MD;  Location: [facility];  Service: Cardiovascular;  Laterality: N/A;  . radio  frequency ablation on lung spot    . rul lobectomy  2010  . TONSILLECTOMY    . TONSILLECTOMY     Current Medications: Prior to Admission medications   Medication Sig Start Date End Date Taking? Authorizing Provider  acetaminophen (TYLENOL) 650 MG CR tablet Take 1,300 mg by mouth 2 (two) times daily.    Yes [provider]  Calcium Carbonate-Vitamin D3 (CALCIUM 600-D) 600-400 MG-UNIT TABS Take 2 tablets by mouth daily.    Yes [provider]  ferrous sulfate 325 (65 FE) MG tablet Take 325 mg by mouth daily with breakfast.   Yes [provider]  fluticasone (FLONASE) 50 MCG/ACT nasal spray Place 1 spray into both nostrils daily as needed for allergies.    Yes [provider]  gabapentin (NEURONTIN) 300 MG capsule Take 600 mg by mouth at bedtime.  07/31/14  Yes [provider]  levothyroxine (SYNTHROID, LEVOTHROID) 100 MCG tablet TAKE 1 TABLET(100 MCG) BY MOUTH DAILY BEFORE BREAKFAST 09/13/17  Yes Dorothy Spark, MD  lidocaine (LIDODERM) 5 % Place 1 patch onto the skin daily. Remove & Discard patch within 12 hours or as directed by MD 10/17/17  Yes Cheryln Manly, NP  losartan (COZAAR) 50 MG tablet Take 1 tablet (50 mg total) by mouth 2 (two) times daily.  10/26/17  Yes Bhagat, Bhavinkumar, PA  metoprolol tartrate (LOPRESSOR) 100 MG tablet Take 1 tablet (100 mg total) by mouth 2 (two) times daily. 10/17/17  Yes Cheryln Manly, NP  Multiple Vitamin (MULTIVITAMIN WITH MINERALS) TABS tablet Take 1 tablet by mouth daily.   Yes [provider]  Multiple Vitamins-Minerals (PRESERVISION AREDS 2 PO) Take 1 capsule by mouth 2 (two) times daily.   Yes [provider]  nitroGLYCERIN (NITROSTAT) 0.4 MG SL tablet Place 1 tablet (0.4 mg total) under the tongue every 5 (five) minutes as needed for chest pain. 10/17/17  Yes Cheryln Manly, NP  pantoprazole (PROTONIX) 40 MG tablet Take 1 tablet (40 mg total) by mouth 2 (two) times daily. 10/17/17  Yes  Reino Bellis B, NP  albuterol (PROVENTIL) (2.5 MG/3ML) 0.083% nebulizer solution Take 3 mLs (2.5 mg total) by nebulization every 4 (four) hours as needed for wheezing (use flutter valve). Patient not taking: Reported on 11/24/2017 10/17/17   Reino Bellis B, NP  arformoterol (BROVANA) 15 MCG/2ML NEBU Take 2 mLs (15 mcg total) by nebulization 2 (two) times daily. Patient not taking: Reported on 11/24/2017 10/17/17   Reino Bellis B, NP  atorvastatin (LIPITOR) 80 MG tablet Take 1 tablet (80 mg total) by mouth daily at 6 PM. Patient not taking: Reported on 11/24/2017 10/17/17   Reino Bellis B, NP  budesonide (PULMICORT) 0.5 MG/2ML nebulizer solution Take 2 mLs (0.5 mg total) by nebulization 2 (two) times daily. Patient not taking: Reported on 11/24/2017 10/17/17   Reino Bellis B, NP  hydrochlorothiazide (HYDRODIURIL) 12.5 MG tablet Take 1 tablet by mouth daily. 11/08/17   [provider]  ondansetron (ZOFRAN) 8 MG tablet Take 1 tablet by mouth as needed. 11/07/17   [provider]  pravastatin (PRAVACHOL) 20 MG tablet Take 1 tablet by mouth daily. 11/22/17   [provider]     Allergies:   Meperidine hcl; Morphine; Penicillins; Oxycodone-acetaminophen; Propoxyphene n-acetaminophen; and Tramadol hcl   Social History   Socioeconomic History  . Marital status: Widowed    Spouse name: Not on file  . Number of children: 2  . Years of education: Not on file  . Highest education level: Not on file  Occupational History  . Occupation: homemaker    Employer: RETIRED  Social Needs  . Financial resource strain: Not on file  . Food insecurity:    Worry: Not on file    Inability: Not on file  . Transportation needs:    Medical: Not on file    Non-medical: Not on file  Tobacco Use  . Smoking status: Former Smoker    Packs/day: 1.00    Years: 17.00    Pack years: 17.00    Types: Cigarettes    Last attempt to quit: 09/28/1975    Years since quitting: 42.5  .  Smokeless tobacco: Never Used  . Tobacco comment: 33 yrs ago  Substance and Sexual Activity  . Alcohol use: Yes    Comment: glass wine daily  . Drug use: No  . Sexual activity: Not on file  Lifestyle  . Physical activity:    Days per week: Not on file    Minutes per session: Not on file  . Stress: Not on file  Relationships  . Social connections:    Talks on phone: Not on file    Gets together: Not on file    Attends religious service: Not on file    Active member of club or organization: Not  on file    Attends meetings of clubs or organizations: Not on file    Relationship status: Not on file  Other Topics Concern  . Not on file  Social History Narrative   [employer] graduate   Married 1963   4 grandchildren and 2 step-grandchildren   Marriage in good health            Physician Roster:   Oncologist- Dr Julien Nordmann   Surgeon- Dr Lindell Spar- Dr Mable Fill- Dr Allyson Sabal     Family History:  The patient's family history includes Asthma in her mother; Cancer in her maternal grandfather and maternal uncle; Heart disease in her mother; Thyroid disease in her daughter.  ROS:   Please see the history of present illness.    ROS All other systems reviewed and are negative.  PHYSICAL EXAM:   VS:  BP (!) 160/80   Pulse 65   Ht 5' 3.5" (1.613 m)   Wt 126 lb (57.2 kg)   BMI 21.97 kg/m    GEN: Well nourished, well developed, in no acute distress  HEENT: normal  Neck: no JVD, carotid bruits, or masses Cardiac: RRR; no murmurs, rubs, or gallops,no edema  Respiratory:  clear to auscultation bilaterally, normal work of breathing GI: soft, nontender, nondistended, + BS MS: no deformity or atrophy  Skin: warm and dry, no rash Neuro:  Alert and Oriented x 3, Strength and sensation are intact Psych: euthymic mood, full affect  Wt Readings from Last 3 Encounters:  04/05/18 126 lb (57.2 kg)  03/19/18 125 lb 9.6 oz (57 kg)  01/16/18 126 lb 12.8 oz (57.5 kg)    Studies/Labs Reviewed:     EKG:  EKG is not ordered today.    Recent Labs: 09/24/2017: Magnesium 2.2 10/02/2017: TSH 1.768 10/04/2017: B Natriuretic Peptide 115.8 01/05/2018: ALT 24; Hemoglobin 13.4; Platelet Count 192; Potassium 4.4; Sodium 142 03/15/2018: BUN 22; Creatinine 1.00   Lipid Panel    Component Value Date/Time   CHOL 110 01/01/2018 0933   TRIG 87 01/01/2018 0933   HDL 55 01/01/2018 0933   CHOLHDL 2.0 01/01/2018 0933   CHOLHDL 4.5 04/12/2016 0841   VLDL 39 (H) 04/12/2016 0841   LDLCALC 38 01/01/2018 0933   LDLDIRECT 137.6 05/17/2013 0937   Additional studies/ records that were reviewed today include:   Cath: 09/21/17 Conclusion    Segmental proximal to mid 90% RCA stenosis. This lesion was treated with stenting using overlapping Synergy 2.5 mm stents to postdilated to 3 mm in diameter with TIMI grade III flow. 0% stenosis was noted post procedure.  Ostial 50-60% circumflex narrowing.  Luminal irregularities throughout the proximal to mid LAD. Very distal eccentric tandem 70% stenoses near the apex. No focally high-grade stenosis is noted.  Normal left main  Low normal LV systolic function in the 54-56% range. Upper normal left ventricular end-diastolic pressure of 17 mmHg.  RECOMMENDATIONS:   Aspirin and Plavix for at least 6 months in the setting of chronic stable ischemic heart disease. Given overlapping stents it would be preferable to continue dual antiplatelet therapy for 12 months.  Aggressive risk factor modification using high intensity statin therapy for as long as tolerated. I have started atorvastatin 80 mg daily and discontinued pravastatin. Blood pressure control with target 130/80 mmHg or less.  Phase 2 cardiac rehab.  Overnight stay since the patient lives alone, is relatively frail, has had some bleeding from the radial cath site.   Cath: 09/21/17 Conclusion  Acute occlusion of the mid right coronary distal to the previously placed stents from earlier  in the day. Acute occlusion was accompanied by ventricular fibrillation. The patient was successfully resuscitated.  Successful recanalization of the right coronary and placement of an additional stent overlapping the distal margin of the previously placed stent. Final stent diameter 3.25 mm throughout the entire stented segment from distal to proximal. 3.5 mm diameter [place] balloon was used at the very proximal stent margin after intravascular ultrasound demonstrated decreased apposition. TIMI grade III flow with no evidence of stenosis postprocedure.  Intravascular ultrasound was used and demonstrated acceptable stent apposition from distal to proximal with the exception of the proximal margin which was underexpanded. As noted above a 3.5 balloon was used to further expand the stent.  RECOMMENDATION:   Aggrastat for 10 hours.  Continue Plavix.  Critical care medicine to manage the patient's ventilator.  Continue IV Versed and fentanyl for sedation while on the ventilator.  Discussed the situation with family. Do not anticipate discharge for several days.  IV hydration and tract kidney function.    ASSESSMENT & PLAN:    1. CAD - s/p PCI/DESx 2 to the [facility] however developed acute  occlusion with VF arrest. The patient was successfully resuscitated and underwent successful recanalization of the right coronary and placement of an additional stent overlapping the distal margin of the previously placed stent. Not on any antiplatelet therapy due to recurrent hemorrhagic anemia. -Encourage to start aspirin 3 times per week again, however after significant GI bleed she is very anxious restarting aspirin.  We will continue losartan, metoprolol and atorvastatin.  2. Anemia/Melena -This has resolved, most recent hemoglobin 13.4.  She is not taking aspirin.  3. HLD - 01/01/2018: Cholesterol, Total 110; HDL 55; LDL Calculated 38; Triglycerides 87  -She is tolerating atorvastatin well.     4. HTN -Her blood pressure running high, I will add amlodipine 2.5 mg daily to her regimen.  5. PAF - Brief episode of Afib rvr prior to VF arrest. Denies palpitations  Medication Adjustments/Labs and Tests Ordered: Current medicines are reviewed at length with the patient today.  Concerns regarding medicines are outlined above.  Medication changes, Labs and Tests ordered today are listed in the Patient Instructions below. Patient Instructions  Medication Instructions:   START TAKING AMLODIPINE 2.5 MG ONCE DAILY     Follow-Up:  3 WEEKS WITH OUR PHARMACIST IN [facility]  4 MONTHS WITH DR Treina Arscott--SCHEDULED YOU TO SEE DR Meda Coffee ON Wednesday  08/08/17 AT 9:40 AM       If you need a refill on your cardiac medications before your next appointment, please call your pharmacy. \     Signed, Ena Dawley, MD  04/05/2018 12:15 PM    [address], [place], [place]  [address] Phone: [phone]; Fax: [phone]

## 2018-04-07 ENCOUNTER — Emergency Department (HOSPITAL_COMMUNITY): Payer: Medicare Other

## 2018-04-07 ENCOUNTER — Encounter (HOSPITAL_COMMUNITY): Payer: Self-pay | Admitting: Emergency Medicine

## 2018-04-07 ENCOUNTER — Emergency Department (HOSPITAL_COMMUNITY)
Admission: EM | Admit: 2018-04-07 | Discharge: 2018-04-07 | Disposition: A | Discharge status: Home / Self Care | Payer: Medicare Other | Attending: Emergency Medicine | Admitting: Emergency Medicine

## 2018-04-07 DIAGNOSIS — M25562 Pain in left knee: Secondary | ICD-10-CM | POA: Insufficient documentation

## 2018-04-07 DIAGNOSIS — N183 Chronic kidney disease, stage 3 (moderate): Secondary | ICD-10-CM | POA: Diagnosis not present

## 2018-04-07 DIAGNOSIS — Z8541 Personal history of malignant neoplasm of cervix uteri: Secondary | ICD-10-CM | POA: Diagnosis not present

## 2018-04-07 DIAGNOSIS — Y9389 Activity, other specified: Secondary | ICD-10-CM | POA: Diagnosis not present

## 2018-04-07 DIAGNOSIS — E039 Hypothyroidism, unspecified: Secondary | ICD-10-CM | POA: Diagnosis not present

## 2018-04-07 DIAGNOSIS — Z87891 Personal history of nicotine dependence: Secondary | ICD-10-CM | POA: Diagnosis not present

## 2018-04-07 DIAGNOSIS — Z85118 Personal history of other malignant neoplasm of bronchus and lung: Secondary | ICD-10-CM | POA: Diagnosis not present

## 2018-04-07 DIAGNOSIS — Z955 Presence of coronary angioplasty implant and graft: Secondary | ICD-10-CM | POA: Diagnosis not present

## 2018-04-07 DIAGNOSIS — Y999 Unspecified external cause status: Secondary | ICD-10-CM | POA: Diagnosis not present

## 2018-04-07 DIAGNOSIS — I129 Hypertensive chronic kidney disease with stage 1 through stage 4 chronic kidney disease, or unspecified chronic kidney disease: Secondary | ICD-10-CM | POA: Diagnosis not present

## 2018-04-07 DIAGNOSIS — R197 Diarrhea, unspecified: Secondary | ICD-10-CM | POA: Diagnosis not present

## 2018-04-07 DIAGNOSIS — R112 Nausea with vomiting, unspecified: Secondary | ICD-10-CM | POA: Diagnosis not present

## 2018-04-07 DIAGNOSIS — I10 Essential (primary) hypertension: Secondary | ICD-10-CM

## 2018-04-07 DIAGNOSIS — I2510 Atherosclerotic heart disease of native coronary artery without angina pectoris: Secondary | ICD-10-CM | POA: Diagnosis not present

## 2018-04-07 DIAGNOSIS — R52 Pain, unspecified: Secondary | ICD-10-CM | POA: Diagnosis not present

## 2018-04-07 DIAGNOSIS — Z79899 Other long term (current) drug therapy: Secondary | ICD-10-CM | POA: Insufficient documentation

## 2018-04-07 DIAGNOSIS — X501XXA Overexertion from prolonged static or awkward postures, initial encounter: Secondary | ICD-10-CM | POA: Diagnosis not present

## 2018-04-07 DIAGNOSIS — Z85828 Personal history of other malignant neoplasm of skin: Secondary | ICD-10-CM | POA: Diagnosis not present

## 2018-04-07 DIAGNOSIS — Y929 Unspecified place or not applicable: Secondary | ICD-10-CM | POA: Diagnosis not present

## 2018-04-07 DIAGNOSIS — Z85820 Personal history of malignant melanoma of skin: Secondary | ICD-10-CM | POA: Insufficient documentation

## 2018-04-07 DIAGNOSIS — M11262 Other chondrocalcinosis, left knee: Secondary | ICD-10-CM | POA: Diagnosis not present

## 2018-04-07 LAB — BASIC METABOLIC PANEL
ANION GAP: 11 (ref 5–15)
BUN: 13 mg/dL (ref 8–23)
CHLORIDE: 102 mmol/L (ref 98–111)
CO2: 25 mmol/L (ref 22–32)
CREATININE: 0.95 mg/dL (ref 0.44–1.00)
Calcium: 9.8 mg/dL (ref 8.9–10.3)
GFR calc Af Amer: >60 mL/min (ref >60)
GFR calc non Af Amer: 56 mL/min — ABNORMAL LOW (ref >60)
Glucose, Bld: 113 mg/dL — ABNORMAL HIGH (ref 70–99)
Potassium: 4.6 mmol/L (ref 3.5–5.1)
SODIUM: 138 mmol/L (ref 135–145)

## 2018-04-07 LAB — CBC
HCT: 43 % (ref 36.0–46.0)
HEMOGLOBIN: 13.7 g/dL (ref 12.0–15.0)
MCH: 29.6 pg (ref 26.0–34.0)
MCHC: 31.9 g/dL (ref 30.0–36.0)
MCV: 92.9 fL (ref 78.0–100.0)
Platelets: 188 10*3/uL (ref 150–400)
RBC: 4.63 MIL/uL (ref 3.87–5.11)
RDW: 14.6 % (ref 11.5–15.5)
WBC: 12.5 10*3/uL — ABNORMAL HIGH (ref 4.0–10.5)

## 2018-04-07 MED ORDER — ONDANSETRON 4 MG PO TBDP: 4.0000 mg | ORAL_TABLET | Freq: Once | ORAL | Status: DC

## 2018-04-07 MED ORDER — ONDANSETRON 4 MG PO TBDP: 4.0000 mg | ORAL_TABLET | Freq: Three times a day (TID) | ORAL | 0 refills | Status: DC | PRN

## 2018-04-07 MED ORDER — FENTANYL CITRATE (PF) 100 MCG/2ML IJ SOLN
50.0000 ug | INTRAMUSCULAR | Status: DC | PRN
  Administered 2018-04-07: 50 ug via INTRAVENOUS
  Filled 2018-04-07: qty 2

## 2018-04-07 MED ORDER — ONDANSETRON HCL 4 MG/2ML IJ SOLN
4.0000 mg | Freq: Once | INTRAMUSCULAR | Status: AC
  Administered 2018-04-07: 4 mg via INTRAVENOUS
  Filled 2018-04-07: qty 2

## 2018-04-07 MED ORDER — MORPHINE SULFATE (PF) 4 MG/ML IV SOLN: 4.0000 mg | Freq: Once | INTRAVENOUS | Status: DC

## 2018-04-07 NOTE — ED Notes (Signed)
Patient transported to CT 

## 2018-04-07 NOTE — ED Notes (Signed)
Patient transported to X-ray 

## 2018-04-07 NOTE — ED Notes (Signed)
Patient verbalizes understanding of discharge instructions. Opportunity for questioning and answers were provided. Pt discharged from ED. 

## 2018-04-07 NOTE — ED Notes (Signed)
IV removed from R a/c; site clean, dry, intact

## 2018-04-07 NOTE — ED Provider Notes (Addendum)
[facility] EMERGENCY DEPARTMENT Provider Note   CSN: [id number] Arrival date & time: 04/07/18  1113     History   Chief Complaint Chief Complaint  Patient presents with  . Knee Pain    HPI [name] is a 77 y.o. female with past medical history of CKD, CAD, hypertension lung cancer, who presents today for evaluation of knee pain.  She reports that last night she was sitting with her left knee bent for multiple hours and then when she got up she has had left knee pain since.  She denies any fevers or chills.  She reports that the pain is making her nauseous and vomit last night.  She also reports one episode of diarrhea last night, denies any abdominal pain.  She reports difficulty walking and that it hurts too much to bear weight.  She was given Zofran and fentanyl by EMS and reportedly had large amounts and nausea and vomiting after getting fentanyl.  HPI  Past Medical History:  Diagnosis Date  . Anemia 09/2017  . Arthritis    back.and feet  . Cervical ca ([place]) dx'd 1988   surg only  . CKD (chronic kidney disease), stage III ([facility])   . Complication of anesthesia    difficulty awakening, dysrhythmia post surgery, (prolonged sedation level)  . Coronary artery calcification seen on CT scan   . Dyspnea   . GERD (gastroesophageal reflux disease)   . Headache    occ. sinus type headaches  . History of hiatal hernia   . Hx of radiation therapy 09/16/13-09/30/13   prevascular lymph node  . Hyperlipidemia   . Hypertension   . Hyperthyroidism    dr Loanne Drilling- radioactive iodine treatment 2011- resolved  . Hypothyroidism   . LBBB (left bundle branch block)   . Lung cancer ([place])    Bilaterally, RUL lobectomy and spot involving both lungs- "cancer"  . Macular degeneration    bilateral  . Melanoma ([place])    left leg  . Radiation 10/11/11-10/21/11   Left upper lobe adenocarcinoma  . Tremor    hand- intention tremor    Patient Active Problem List   Diagnosis  Date Noted  . Acute upper GI bleed   . Anemia due to acute blood loss   . Closed flail chest   . CAD (coronary artery disease) 09/21/2017  . Coronary stent thrombosis   . Endotracheal tube present   . CAD in native artery 09/20/2017  . Elevated serum creatinine 04/15/2016  . Acute kidney injury superimposed on chronic kidney disease ([place]) 03/24/2016  . Coronary artery calcification seen on CT scan   . CKD (chronic kidney disease), stage III ([place])   . Hypertension   . Hypothyroidism 03/15/2016  . Hyperlipidemia 08/19/2015  . Cancer of upper lobe of left lung ([place])   . Recurrent lung adenocarcinoma ([place]) 10/01/2014  . Local recurrence of left lung cancer ([place])   . Carotid artery disease without cerebral infarction ([place]) 05/20/2013  . Atherosclerosis of aorta ([place]) 05/10/2013  . Macular degeneration 05/10/2013  . Allergic rhinitis 10/20/2012  . Cancer of upper lobe of lung ([place]) 09/28/2011  . Melanoma ([place])   . LBBB (left bundle branch block)   . Gastritis due to nonsteroidal anti-inflammatory drug 06/20/2011  . Hyperthyroidism   . Tremor   . Abdominal pain, RUQ 02/10/2011  . GERD with stricture 02/08/2011  . GOITER, MULTINODULAR 09/25/2009  . URI 09/02/2009  . Essential hypertension, benign 12/01/2008    Past Surgical  History:  Procedure Laterality Date  . basal cell and squamous cell skin cell area removed     new left lower eye lid done with skin graft   . CARDIAC CATHETERIZATION  09/21/2017  . CATARACT EXTRACTION, BILATERAL Bilateral   . COLONOSCOPY WITH PROPOFOL N/A 10/27/2016   Procedure: COLONOSCOPY WITH PROPOFOL;  Surgeon: Laurence Spates, MD;  Location: [facility];  Service: Endoscopy;  Laterality: N/A;  . conization for cervical dysplasia    . CORONARY STENT INTERVENTION N/A 09/21/2017   Procedure: CORONARY STENT INTERVENTION;  Surgeon: Belva Crome, MD;  Location: [facility];  Service: Cardiovascular;  Laterality: N/A;  . CORONARY/GRAFT ACUTE MI  REVASCULARIZATION N/A 09/21/2017   Procedure: Coronary/Graft Acute MI Revascularization;  Surgeon: Belva Crome, MD;  Location: [facility];  Service: Cardiovascular;  Laterality: N/A;  . ESOPHAGOGASTRODUODENOSCOPY (EGD) WITH PROPOFOL N/A 09/28/2017   Procedure: ESOPHAGOGASTRODUODENOSCOPY (EGD) WITH PROPOFOL;  Surgeon: Arta Silence, MD;  Location: [facility];  Service: Endoscopy;  Laterality: N/A;  . ESOPHAGOGASTRODUODENOSCOPY (EGD) WITH PROPOFOL N/A 09/30/2017   Procedure: ESOPHAGOGASTRODUODENOSCOPY (EGD) WITH PROPOFOL;  Surgeon: Otis Brace, MD;  Location: [facility];  Service: Gastroenterology;  Laterality: N/A;  . esophogus stretching    . GIVENS CAPSULE STUDY N/A 10/06/2017   Procedure: GIVENS CAPSULE STUDY;  Surgeon: Arta Silence, MD;  Location: [facility] ENDOSCOPY;  Service: Endoscopy;  Laterality: N/A;  . INTRAVASCULAR ULTRASOUND/IVUS N/A 09/21/2017   Procedure: Intravascular Ultrasound/IVUS;  Surgeon: Belva Crome, MD;  Location: [facility] LAB;  Service: Cardiovascular;  Laterality: N/A;  . IR GENERIC HISTORICAL  09/11/2014   IR RADIOLOGIST EVAL & MGMT 09/11/2014 Jacqulynn Cadet, MD [facility] INTERV RAD  . LEFT HEART CATH AND CORONARY ANGIOGRAPHY N/A 09/21/2017   Procedure: LEFT HEART CATH AND CORONARY ANGIOGRAPHY;  Surgeon: Belva Crome, MD;  Location: [facility];  Service: Cardiovascular;  Laterality: N/A;  . LEFT HEART CATH AND CORONARY ANGIOGRAPHY N/A 09/21/2017   Procedure: LEFT HEART CATH AND CORONARY ANGIOGRAPHY;  Surgeon: Belva Crome, MD;  Location: [facility];  Service: Cardiovascular;  Laterality: N/A;  . radio frequency ablation on lung spot    . rul lobectomy  2010  . TONSILLECTOMY    . TONSILLECTOMY       OB History   None      Home Medications    Prior to Admission medications   Medication Sig Start Date End Date Taking? Authorizing Provider  acetaminophen (TYLENOL) 650 MG CR tablet Take 1,300 mg by mouth 2 (two) times daily.      [provider]  amLODipine (NORVASC) 2.5 MG tablet Take 1 tablet (2.5 mg total) by mouth daily. 04/05/18   Dorothy Spark, MD  atorvastatin (LIPITOR) 80 MG tablet Take 1 tablet (80 mg total) by mouth daily. 11/24/17 03/19/18  Bhagat, Crista Luria, PA  Calcium Carbonate-Vitamin D3 (CALCIUM 600-D) 600-400 MG-UNIT TABS Take 2 tablets by mouth daily.     [provider]  ferrous sulfate 325 (65 FE) MG tablet Take 325 mg by mouth daily with breakfast.    [provider]  fluticasone (FLONASE) 50 MCG/ACT nasal spray Place 1 spray into both nostrils daily as needed for allergies.     [provider]  gabapentin (NEURONTIN) 300 MG capsule Take 600 mg by mouth at bedtime.  07/31/14   [provider]  levothyroxine (SYNTHROID, LEVOTHROID) 88 MCG tablet  01/08/18   [provider]  lidocaine (LIDODERM) 5 % Place 1 patch  onto the skin daily. Remove & Discard patch within 12 hours or as directed by MD 10/17/17   Cheryln Manly, NP  losartan (COZAAR) 50 MG tablet Take 1 tablet (50 mg total) by mouth 2 (two) times daily. 10/26/17   Bhagat, Crista Luria, PA  metoprolol tartrate (LOPRESSOR) 100 MG tablet Take 1 tablet (100 mg total) by mouth 2 (two) times daily. 10/17/17   Cheryln Manly, NP  Multiple Vitamin (MULTIVITAMIN WITH MINERALS) TABS tablet Take 1 tablet by mouth daily.    [provider]  Multiple Vitamins-Minerals (PRESERVISION AREDS 2 PO) Take 1 capsule by mouth 2 (two) times daily.    [provider]  nitroGLYCERIN (NITROSTAT) 0.4 MG SL tablet Place 1 tablet (0.4 mg total) under the tongue every 5 (five) minutes as needed for chest pain. 10/17/17   Cheryln Manly, NP  ondansetron (ZOFRAN ODT) 4 MG disintegrating tablet Take 1 tablet (4 mg total) by mouth every 8 (eight) hours as needed for nausea or vomiting. 04/07/18   Lorin Glass, PA-C  ondansetron (ZOFRAN) 8 MG tablet Take 1 tablet by mouth as needed. 11/07/17   [provider]  pantoprazole (PROTONIX) 40 MG tablet Take 1 tablet (40 mg total) by mouth 2 (two) times daily. 10/17/17   Cheryln Manly, NP    Family History Family History  Problem Relation Age of Onset  . Asthma Mother   . Heart disease Mother   . Thyroid disease Daughter        hypothyroidism  . Cancer Maternal Uncle        lung  . Cancer Maternal Grandfather        lung  . Diabetes Neg Hx   . Coronary artery disease Neg Hx     Social History Social History   Tobacco Use  . Smoking status: Former Smoker    Packs/day: 1.00    Years: 17.00    Pack years: 17.00    Types: Cigarettes    Last attempt to quit: 09/28/1975    Years since quitting: 42.5  . Smokeless tobacco: Never Used  . Tobacco comment: 33 yrs ago  Substance Use Topics  . Alcohol use: Yes    Comment: glass wine daily  . Drug use: No     Allergies   Meperidine hcl; Morphine; Penicillins; Oxycodone-acetaminophen; Propoxyphene n-acetaminophen; and Tramadol hcl   Review of Systems Review of Systems  Constitutional: Negative for chills and fever.  Eyes: Negative for visual disturbance.  Respiratory: Negative for cough, chest tightness and shortness of breath.   Cardiovascular: Negative for chest pain.  Gastrointestinal: Positive for diarrhea, nausea and vomiting. Negative for abdominal pain.  Genitourinary: Negative for dysuria and hematuria.  Musculoskeletal:       Left knee pain  Skin: Negative for rash.  Neurological: Negative for dizziness, weakness and headaches.  All other systems reviewed and are negative.    Physical Exam Updated Vital Signs BP (!) 204/98 (BP Location: Left Arm)   Pulse 78   Temp (!) 97.5 F (36.4 C) (Oral)   Resp 16   SpO2 96%   Physical Exam  Constitutional: She is oriented to person, place, and time. She appears well-developed. No distress.  HENT:  Head: Normocephalic.  Cardiovascular: Normal rate and intact distal pulses.  No murmur heard. Pulmonary/Chest:  Effort normal. No stridor. No respiratory distress. She has no wheezes.  Abdominal: Soft. Bowel sounds are normal. She exhibits no distension. There is no tenderness. There is no guarding.  Musculoskeletal:  5/5 strength in bilateral ankles through dorsiflexion and plantarflexion.  Left knee has limited range of motion secondary to pain.  Neurological: She is alert and oriented to person, place, and time.  Skin: Skin is warm and dry. She is not diaphoretic.  No abnormal wounds, erythema, edema, or ecchymosis over the right knee.  Psychiatric: She has a normal mood and affect. Her behavior is normal.  Nursing note and vitals reviewed.    ED Treatments / Results  Labs (all labs ordered are listed, but only abnormal results are displayed) Labs Reviewed  CBC - Abnormal; Notable for the following components:      Result Value   WBC 12.5 (*)    All other components within normal limits  BASIC METABOLIC PANEL - Abnormal; Notable for the following components:   Glucose, Bld 113 (*)    GFR calc non Af Amer 56 (*)    All other components within normal limits    EKG None  Radiology Ct Knee Left Wo Contrast  Result Date: 04/07/2018 CLINICAL DATA:  Left knee pain since last night.  No known injury. EXAM: CT OF THE LEFT KNEE WITHOUT CONTRAST TECHNIQUE: Multidetector CT imaging of the LEFT knee was performed according to the standard protocol. Multiplanar CT image reconstructions were also generated. COMPARISON:  None. FINDINGS: Bones/Joint/Cartilage No fracture or dislocation. Normal alignment. No joint effusion. Generalized osteopenia. Severe patellofemoral compartment joint space narrowing. Mild lateral femorotibial compartment joint space narrowing. No significant medial femorotibial compartment joint space narrowing. 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 No aggressive osseous lesion. Ligaments Ligaments are suboptimally evaluated by CT.  Muscles and Tendons Muscles are normal. No muscle atrophy. No intramuscular fluid collection or hematoma. Quadriceps tendon and patellar tendon are intact. Soft tissue No fluid collection or hematoma. No soft tissue mass. Peripheral vascular atherosclerotic disease. IMPRESSION: 1.  No acute osseous injury of the left knee. 2. 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 Severe osteoarthritis of patellofemoral compartment. Mild osteoarthritis of the lateral femorotibial compartment. Electronically Signed   By: Kathreen Devoid   On: 04/07/2018 15:55   Dg Knee Complete 4 Views Left  Result Date: 04/07/2018 CLINICAL DATA:  Left knee pain beginning last evening. No known trauma. EXAM: LEFT KNEE - COMPLETE 4+ VIEW COMPARISON:  None. FINDINGS: Examination is degraded due to obliquity. Osteopenia. No definite fracture or dislocation. No knee joint effusion. Small amount of chondrocalcinosis is seen on the provided lateral radiograph. Suspected mild degenerative change involving the lateral compartment and patellofemoral joints with articular surface irregularity, subchondral sclerosis osteophytosis. Scattered vascular calcifications. No radiopaque foreign body. IMPRESSION: 1. Osteopenia without definitive acute finding. 2. Chondrocalcinosis as could be seen in the setting of CPPD. 3. Mild degenerative change of the knee as above. Electronically Signed   By: Sandi Mariscal M.D.   On: 04/07/2018 13:42    Procedures Procedures (including critical care time)  Medications Ordered in ED Medications  fentaNYL (SUBLIMAZE) injection 50 mcg (50 mcg Intravenous Given 04/07/18 1459)  ondansetron (ZOFRAN) injection 4 mg (4 mg Intravenous Given 04/07/18 1223)  ondansetron (ZOFRAN) injection 4 mg (4 mg Intravenous Given 04/07/18 1551)     Initial Impression / Assessment and Plan / ED Course  I have reviewed the triage vital signs and the nursing notes.  Pertinent labs &  imaging results that were available during my care of the patient were reviewed by me and considered  in my medical decision making (see chart for details).    Patient presents today for evaluation of left-sided knee pain that started last night.  She did not have any significant injury.  Knee is not red, is preferentially held partially flexed, patient is afebrile, exam and history are not consistent with septic arthritis.  X-rays were obtained showing significant arthritis without evidence of fracture, effusion, or other abnormalities.  This patient was seen as a shared visit with Dr. Venora Maples.  Patient's pain was treated in the emergency room with fentanyl, which resulted in significant amount of nausea and vomiting.  Patient has PT pulses bilaterally, feet are warm, well perfused.    CT knee was obtained showing possible pseudogout with out other abnormality.    Patient has significant assistive devices at home including a shower chair, walker, and wheelchair.  Discussed need to follow-up with her preferred orthopedist as an outpatient.  Patient and family state their understanding.  Return precautions were discussed with patient who states their understanding.  At the time of discharge patient denied any unaddressed complaints or concerns.  Patient is agreeable for discharge home.   Final Clinical Impressions(s) / ED Diagnoses   Final diagnoses:  Acute pain of left knee  Essential hypertension    ED Discharge Orders         Ordered    ondansetron (ZOFRAN ODT) 4 MG disintegrating tablet  Every 8 hours PRN     04/07/18 1624           Lorin Glass, [place] 04/07/18 1614    Lorin Glass, PA-C 04/07/18 1625    Jola Schmidt, MD 04/07/18 1700

## 2018-04-07 NOTE — ED Notes (Signed)
Wasted 50 mcg fentanyl in pod D sharps; Witnessed by Verlene Mayer, RN

## 2018-04-07 NOTE — ED Notes (Signed)
MD aware of pt's HTN

## 2018-04-07 NOTE — Discharge Instructions (Addendum)
Please follow up with orthopedics.  Please call them for an appointment on Monday.  If you develop fevers or have significant worsening or change in symptoms please seek additional medical care and evaluation.  While you are in the emergency room your blood pressure was elevated, this is most likely secondary to pain, nausea, and being in the emergency room.  Please follow-up with your primary care doctor in the next week for a blood pressure recheck.

## 2018-04-07 NOTE — ED Triage Notes (Signed)
Per EMS- pt denies trauma, has pain to the left knee that started last night, no deformity or swelling. Given fentanyl for pain and then zofran for nausea post pain meds.

## 2018-04-09 DIAGNOSIS — M25562 Pain in left knee: Secondary | ICD-10-CM | POA: Diagnosis not present

## 2018-04-16 DIAGNOSIS — L570 Actinic keratosis: Secondary | ICD-10-CM | POA: Diagnosis not present

## 2018-04-16 DIAGNOSIS — C44529 Squamous cell carcinoma of skin of other part of trunk: Secondary | ICD-10-CM | POA: Diagnosis not present

## 2018-04-16 DIAGNOSIS — L820 Inflamed seborrheic keratosis: Secondary | ICD-10-CM | POA: Diagnosis not present

## 2018-04-17 DIAGNOSIS — C3491 Malignant neoplasm of unspecified part of right bronchus or lung: Secondary | ICD-10-CM | POA: Diagnosis not present

## 2018-04-17 DIAGNOSIS — K219 Gastro-esophageal reflux disease without esophagitis: Secondary | ICD-10-CM | POA: Diagnosis not present

## 2018-04-17 DIAGNOSIS — E7800 Pure hypercholesterolemia, unspecified: Secondary | ICD-10-CM | POA: Diagnosis not present

## 2018-04-17 DIAGNOSIS — E039 Hypothyroidism, unspecified: Secondary | ICD-10-CM | POA: Diagnosis not present

## 2018-04-17 DIAGNOSIS — N183 Chronic kidney disease, stage 3 (moderate): Secondary | ICD-10-CM | POA: Diagnosis not present

## 2018-04-17 DIAGNOSIS — Z23 Encounter for immunization: Secondary | ICD-10-CM | POA: Diagnosis not present

## 2018-04-17 DIAGNOSIS — I700 Atherosclerosis of aorta: Secondary | ICD-10-CM | POA: Diagnosis not present

## 2018-04-17 DIAGNOSIS — I10 Essential (primary) hypertension: Secondary | ICD-10-CM | POA: Diagnosis not present

## 2018-04-17 DIAGNOSIS — M179 Osteoarthritis of knee, unspecified: Secondary | ICD-10-CM | POA: Diagnosis not present

## 2018-04-19 DIAGNOSIS — M25562 Pain in left knee: Secondary | ICD-10-CM | POA: Diagnosis not present

## 2018-04-27 ENCOUNTER — Ambulatory Visit (INDEPENDENT_AMBULATORY_CARE_PROVIDER_SITE_OTHER): Payer: Medicare Other | Admitting: Pharmacist

## 2018-04-27 VITALS — BP 150/72 | HR 68

## 2018-04-27 DIAGNOSIS — I10 Essential (primary) hypertension: Secondary | ICD-10-CM | POA: Diagnosis not present

## 2018-04-27 DIAGNOSIS — I2510 Atherosclerotic heart disease of native coronary artery without angina pectoris: Secondary | ICD-10-CM

## 2018-04-27 MED ORDER — AMLODIPINE BESYLATE 5 MG PO TABS: 5.0000 mg | ORAL_TABLET | Freq: Every day | ORAL | 11 refills | Status: DC

## 2018-04-27 NOTE — Patient Instructions (Addendum)
It was nice to meet you today  Increase your amlodipine to 5mg  once a day  Continue taking your other medications  Limit the sodium in your diet to < 2,000mg  each day. Look for lower sodium or heart healthy Lean Cuisines  Monitor your blood pressure at home and record your readings  Follow up in clinic in 4 weeks to recheck your blood pressure. Please bring in your home cuff to this visit  Call Pema Thomure, Pharmacist in clinic with any blood pressure concerns before your next appointment

## 2018-04-27 NOTE — Progress Notes (Signed)
Patient ID: [name]                 DOB: [birth year]                      MRN: [id number]     HPI: [name] is a 77 y.o. female referred by Dr. Meda Coffee to HTN clinic. PMH is significant for CAD, CKD stage III, carotid ultrasound showing 1-39% stenosis, HTN, lung cancer, LBBB, GERD. Coronary CTA with FFR in 09/2017 was abnormal. Cath 09/21/17 showed segmental proximal to mid 90% RCA stenosis s/p overlapping DES. Hospital course complicated by afib RVR then Vfib arrest, relook cath showed acute vessel closure and pt was restented. Further complicated by hemorrhagic anemia and multiple transfusions. Bleeding was related to dual antiplatelet therapy on top of gastric ulcers, colonic ulcers, and multiple small bowel AVM/bleeding lesions. Ultimately all antiplatelet therapy was discontinued and bleeding stopped. She was seen by Dr Meda Coffee 3 weeks ago and BP was elevated at 160/63mmHg. She was started on amlodipine 2.5mg  daily and presents today for follow up.  Pt presents today in good spirits. She reports tolerating her amlodipine well. Denies dizziness, blurred vision, falls, or headache. She has not been monitoring her BP recently but reports her PCP checked it the other week and was 136/80s. She took her AM medications 3-4 hours ago. Exercise is minimal due to back pain and DJD. She uses Tylenol for pain as well as etodolac. Discussed limiting use of etodolac due to potential for increase in BP, bleeding, and renal dysfunction. Reports Dr Meda Coffee is ok with systolic BP up to 283 given age.  Current HTN meds: amlodipine 2.5mg  daily, losartan 50mg  BID, metoprolol tartrate 100mg  BID  BP goal: <151/76HYWV - ok with systolic BP up to 371 per Dr Meda Coffee.  Family History: The patient's family history includes Asthma in her mother; Cancer in her maternal grandfather and maternal uncle; Heart disease in her mother; Thyroid disease in her daughter.  Social History: Former smoker 1 PPD for 17 years, quit in  1977. Drinks a glass of wine daily, denies illicit drug use. Lives by herself.  Diet:  Breakfast - eggs and toast, cereal, yogurt, or muffin. OJ or coffee Lunch - sandwich with protein, sometimes egg salad Dinner - Lean Cuisine Caffeine - 1 cup of coffee each day, tea sometimes  Exercise: None - back issues and DJD  Wt Readings from Last 3 Encounters:  04/05/18 126 lb (57.2 kg)  03/19/18 125 lb 9.6 oz (57 kg)  01/16/18 126 lb 12.8 oz (57.5 kg)   BP Readings from Last 3 Encounters:  04/07/18 (!) 206/74  04/05/18 (!) 160/80  03/19/18 (!) 173/78   Pulse Readings from Last 3 Encounters:  04/07/18 77  04/05/18 65  03/19/18 62    Renal function: CrCl cannot be calculated (Unknown ideal weight.).  Past Medical History:  Diagnosis Date  . Anemia 09/2017  . Arthritis    back.and feet  . Cervical ca ([place]) dx'd 1988   surg only  . CKD (chronic kidney disease), stage III ([place])   . Complication of anesthesia    difficulty awakening, dysrhythmia post surgery, (prolonged sedation level)  . Coronary artery calcification seen on CT scan   . Dyspnea   . GERD (gastroesophageal reflux disease)   . Headache    occ. sinus type headaches  . History of hiatal hernia   . Hx of radiation therapy 09/16/13-09/30/13   prevascular lymph node  .  Hyperlipidemia   . Hypertension   . Hyperthyroidism    dr Loanne Drilling- radioactive iodine treatment 2011- resolved  . Hypothyroidism   . LBBB (left bundle branch block)   . Lung cancer ([place])    Bilaterally, RUL lobectomy and spot involving both lungs- "cancer"  . Macular degeneration    bilateral  . Melanoma ([place])    left leg  . Radiation 10/11/11-10/21/11   Left upper lobe adenocarcinoma  . Tremor    hand- intention tremor    Current Outpatient Medications on File Prior to Visit  Medication Sig Dispense Refill  . acetaminophen (TYLENOL) 650 MG CR tablet Take 1,300 mg by mouth 2 (two) times daily.     Marland Kitchen amLODipine (NORVASC) 2.5 MG tablet Take 1  tablet (2.5 mg total) by mouth daily. 90 tablet 3  . atorvastatin (LIPITOR) 80 MG tablet Take 1 tablet (80 mg total) by mouth daily. 90 tablet 3  . Calcium Carbonate-Vitamin D3 (CALCIUM 600-D) 600-400 MG-UNIT TABS Take 2 tablets by mouth daily.     . ferrous sulfate 325 (65 FE) MG tablet Take 325 mg by mouth daily with breakfast.    . fluticasone (FLONASE) 50 MCG/ACT nasal spray Place 1 spray into both nostrils daily as needed for allergies.     Marland Kitchen gabapentin (NEURONTIN) 300 MG capsule Take 600 mg by mouth at bedtime.   2  . levothyroxine (SYNTHROID, LEVOTHROID) 88 MCG tablet     . lidocaine (LIDODERM) 5 % Place 1 patch onto the skin daily. Remove & Discard patch within 12 hours or as directed by MD 30 patch 0  . losartan (COZAAR) 50 MG tablet Take 1 tablet (50 mg total) by mouth 2 (two) times daily. 180 tablet 3  . metoprolol tartrate (LOPRESSOR) 100 MG tablet Take 1 tablet (100 mg total) by mouth 2 (two) times daily.    . Multiple Vitamin (MULTIVITAMIN WITH MINERALS) TABS tablet Take 1 tablet by mouth daily.    . Multiple Vitamins-Minerals (PRESERVISION AREDS 2 PO) Take 1 capsule by mouth 2 (two) times daily.    . nitroGLYCERIN (NITROSTAT) 0.4 MG SL tablet Place 1 tablet (0.4 mg total) under the tongue every 5 (five) minutes as needed for chest pain.  0  . ondansetron (ZOFRAN ODT) 4 MG disintegrating tablet Take 1 tablet (4 mg total) by mouth every 8 (eight) hours as needed for nausea or vomiting. 10 tablet 0  . ondansetron (ZOFRAN) 8 MG tablet Take 1 tablet by mouth as needed.  1  . pantoprazole (PROTONIX) 40 MG tablet Take 1 tablet (40 mg total) by mouth 2 (two) times daily.     No current facility-administered medications on file prior to visit.     Allergies  Allergen Reactions  . Meperidine Hcl Other (See Comments)    "knocked pt out for 4 days"  . Morphine Other (See Comments)    Stopped breathing due to too high of a dose per patient.  Not an allergic reaction to morphine  .  Penicillins Anaphylaxis, Shortness Of Breath and Other (See Comments)    Difficulty breathing  . Oxycodone-Acetaminophen Itching       . Propoxyphene N-Acetaminophen Nausea And Vomiting  . Tramadol Hcl Nausea And Vomiting     Assessment/Plan:  1. Hypertension - BP remains elevated above goal <130/56mmHg. Will increase amlodipine to 5mg  daily and continue losartan 50mg  BID and metoprolol tartrate 100mg  BID. Discussed looking for low sodium/heart healthy Lean Cuisines. Will f/u in HTN clinic in 4 weeks for  BP check. Advised pt to monitor BP at home and to bring in home BP readings and cuff to next appt.   Megan E. Supple, PharmD, BCACP, [place] [address] [place], [place] [address] Phone: [phone]; Fax: [phone] 04/27/2018 12:20 PM

## 2018-04-28 ENCOUNTER — Other Ambulatory Visit: Payer: Self-pay | Admitting: Cardiology

## 2018-05-29 ENCOUNTER — Ambulatory Visit (INDEPENDENT_AMBULATORY_CARE_PROVIDER_SITE_OTHER): Payer: Medicare Other | Admitting: Pharmacist

## 2018-05-29 VITALS — BP 170/86 | HR 69

## 2018-05-29 DIAGNOSIS — I10 Essential (primary) hypertension: Secondary | ICD-10-CM

## 2018-05-29 DIAGNOSIS — I2510 Atherosclerotic heart disease of native coronary artery without angina pectoris: Secondary | ICD-10-CM | POA: Diagnosis not present

## 2018-05-29 MED ORDER — AMLODIPINE BESYLATE 10 MG PO TABS: 10.0000 mg | ORAL_TABLET | Freq: Every day | ORAL | 11 refills | Status: DC

## 2018-05-29 NOTE — Progress Notes (Signed)
Patient ID: [name]                 DOB: May 25, 1941                      MRN: [id number]     HPI: [name] is a 77 y.o. female referred by Dr. Meda Coffee to HTN clinic. PMH is significant for CAD, CKD stage III, carotid ultrasound showing 1-39% stenosis, HTN, lung cancer, LBBB, GERD. Coronary CTA with FFR in 09/2017 was abnormal. Cath 09/21/17 showed segmental proximal to mid 90% RCA stenosis s/p overlapping DES. Hospital course complicated by afib RVR then Vfib arrest, relook cath showed acute vessel closure and pt was restented. Further complicated by hemorrhagic anemia and multiple transfusions. Bleeding was related to dual antiplatelet therapy on top of gastric ulcers, colonic ulcers, and multiple small bowel AVM/bleeding lesions. Ultimately all antiplatelet therapy was discontinued and bleeding stopped. At last visit in HTN clinic 1 month ago, BP remained elevated at 150/72 and amlodipine was increased to 5mg  daily.  Pt presents today in good spirits. She reports tolerating her higher dose of amlodipine well. Denies dizziness, blurred vision, falls, or headache. She has switched to decaf coffee and has decreased the frequency of Lean Cuisine meals at home. She has started checking her BP at home as well - 1 reading at goal, most readings 140-160s/70-90. Exercise is minimal due to back pain and DJD. She uses Tylenol for pain as well as etodolac. Discussed limiting use of etodolac due to potential for increase in BP, bleeding, and renal dysfunction. Reports Dr Meda Coffee is ok with systolic BP up to 010 given age. Reports she is sensitive to medication changes. She is hosting and cooking for 22 people at her home for Thanksgiving. She brings in home cuff today, has a history of white coat HTN. Home reading 172/93, clinic reading 170/86. Home cuff measuring accurately and she does have white coat HTN based on home readings 272-536 systolic.  Current HTN meds: amlodipine 5mg  daily, losartan 50mg  BID,  metoprolol tartrate 100mg  BID  BP goal: <644/03KVQQ - ok with systolic BP up to 595 per Dr Meda Coffee.  Family History: The patient's family history includes Asthma in her mother; Cancer in her maternal grandfather and maternal uncle; Heart disease in her mother; Thyroid disease in her daughter.  Social History: Former smoker 1 PPD for 17 years, quit in 1977. Drinks a glass of wine daily, denies illicit drug use. Lives by herself.  Diet:  Breakfast - eggs and toast, cereal, yogurt, or muffin. OJ or coffee Lunch - sandwich with protein, sometimes egg salad Dinner - Lean Cuisine Caffeine - 1 cup of coffee each day, tea sometimes  Exercise: None - back issues and DJD  Home BP readings: low 118/74 (only reading at goal), 139/84, 149/81, 161/77, 152/81, 165/91, 169/94  Wt Readings from Last 3 Encounters:  04/05/18 126 lb (57.2 kg)  03/19/18 125 lb 9.6 oz (57 kg)  01/16/18 126 lb 12.8 oz (57.5 kg)   BP Readings from Last 3 Encounters:  04/27/18 (!) 150/72  04/07/18 (!) 206/74  04/05/18 (!) 160/80   Pulse Readings from Last 3 Encounters:  04/27/18 68  04/07/18 77  04/05/18 65    Renal function: CrCl cannot be calculated (Patient's most recent lab result is older than the maximum 21 days allowed.).  Past Medical History:  Diagnosis Date  . Anemia 09/2017  . Arthritis    back.and feet  . Cervical  [facility]) dx'd 1988   surg only  . CKD (chronic kidney disease), stage III ([place])   . Complication of anesthesia    difficulty awakening, dysrhythmia post surgery, (prolonged sedation level)  . Coronary artery calcification seen on CT scan   . Dyspnea   . GERD (gastroesophageal reflux disease)   . Headache    occ. sinus type headaches  . History of hiatal hernia   . Hx of radiation therapy 09/16/13-09/30/13   prevascular lymph node  . Hyperlipidemia   . Hypertension   . Hyperthyroidism    dr Loanne Drilling- radioactive iodine treatment 2011- resolved  . Hypothyroidism   . LBBB (left bundle  branch block)   . Lung cancer ([place])    Bilaterally, RUL lobectomy and spot involving both lungs- "cancer"  . Macular degeneration    bilateral  . Melanoma ([place])    left leg  . Radiation 10/11/11-10/21/11   Left upper lobe adenocarcinoma  . Tremor    hand- intention tremor    Current Outpatient Medications on File Prior to Visit  Medication Sig Dispense Refill  . acetaminophen (TYLENOL) 650 MG CR tablet Take 1,300 mg by mouth 2 (two) times daily.     Marland Kitchen amLODipine (NORVASC) 5 MG tablet TAKE 1 TABLET(5 MG) BY MOUTH DAILY 90 tablet 3  . atorvastatin (LIPITOR) 80 MG tablet Take 1 tablet (80 mg total) by mouth daily. 90 tablet 3  . Calcium Carbonate-Vitamin D3 (CALCIUM 600-D) 600-400 MG-UNIT TABS Take 2 tablets by mouth daily.     . ferrous sulfate 325 (65 FE) MG tablet Take 325 mg by mouth daily with breakfast.    . fluticasone (FLONASE) 50 MCG/ACT nasal spray Place 1 spray into both nostrils daily as needed for allergies.     Marland Kitchen gabapentin (NEURONTIN) 300 MG capsule Take 600 mg by mouth at bedtime.   2  . levothyroxine (SYNTHROID, LEVOTHROID) 88 MCG tablet     . lidocaine (LIDODERM) 5 % Place 1 patch onto the skin daily. Remove & Discard patch within 12 hours or as directed by MD 30 patch 0  . losartan (COZAAR) 50 MG tablet Take 1 tablet (50 mg total) by mouth 2 (two) times daily. 180 tablet 3  . metoprolol tartrate (LOPRESSOR) 100 MG tablet Take 1 tablet (100 mg total) by mouth 2 (two) times daily.    . Multiple Vitamin (MULTIVITAMIN WITH MINERALS) TABS tablet Take 1 tablet by mouth daily.    . Multiple Vitamins-Minerals (PRESERVISION AREDS 2 PO) Take 1 capsule by mouth 2 (two) times daily.    . nitroGLYCERIN (NITROSTAT) 0.4 MG SL tablet Place 1 tablet (0.4 mg total) under the tongue every 5 (five) minutes as needed for chest pain.  0  . ondansetron (ZOFRAN ODT) 4 MG disintegrating tablet Take 1 tablet (4 mg total) by mouth every 8 (eight) hours as needed for nausea or vomiting. 10 tablet 0  .  ondansetron (ZOFRAN) 8 MG tablet Take 1 tablet by mouth as needed.  1  . pantoprazole (PROTONIX) 40 MG tablet Take 1 tablet (40 mg total) by mouth 2 (two) times daily.     No current facility-administered medications on file prior to visit.     Allergies  Allergen Reactions  . Meperidine Hcl Other (See Comments)    "knocked pt out for 4 days"  . Morphine Other (See Comments)    Stopped breathing due to too high of a dose per patient.  Not an allergic reaction to morphine  . Penicillins  Anaphylaxis, Shortness Of Breath and Other (See Comments)    Difficulty breathing  . Oxycodone-Acetaminophen Itching       . Propoxyphene N-Acetaminophen Nausea And Vomiting  . Tramadol Hcl Nausea And Vomiting     Assessment/Plan:  1. Hypertension - BP remains elevated above goal <130/70mmHg. She does have white coat HTN. Readings at home more controlled however remain above goal, and home cuff measures accurately to manual clinic reading. Will increase amlodipine to 10mg  daily and continue losartan 50mg  BID and metoprolol tartrate 100mg  BID. F/u in  HTN clinic in 4 weeks. If BP remains elevated above goal, will plan to switch from metoprolol to carvedilol for better BP lowering. Pt will continue to monitor BP at home as these readings will be more helpful than clinic readings in determining medication dose adjustment needs.  Aspen Lawrance E. Terrian Ridlon, PharmD, BCACP, [facility] [place] [address], [place], [place] [address] Phone: [phone]; Fax: [phone] 05/29/2018 9:32 AM

## 2018-05-29 NOTE — Patient Instructions (Addendum)
Increase your amlodipine to 10mg  daily  Continue taking your losartan and metoprolol  Monitor your blood pressure at home - your goal is <130/18mmHg however Dr Meda Coffee is ok with your top number going up to 140  Follow up in clinic in 4 weeks for a blood pressure check. Call Roxie Gueye, Pharmacist with any concerns before then [phone]

## 2018-05-30 DIAGNOSIS — C44712 Basal cell carcinoma of skin of right lower limb, including hip: Secondary | ICD-10-CM | POA: Diagnosis not present

## 2018-06-13 DIAGNOSIS — C44311 Basal cell carcinoma of skin of nose: Secondary | ICD-10-CM | POA: Diagnosis not present

## 2018-06-18 ENCOUNTER — Ambulatory Visit (HOSPITAL_COMMUNITY)
Admission: RE | Admit: 2018-06-18 | Discharge: 2018-06-18 | Disposition: A | Discharge status: Home / Self Care | Payer: Medicare Other | Source: Ambulatory Visit | Attending: Radiation Oncology | Admitting: Radiation Oncology

## 2018-06-18 ENCOUNTER — Ambulatory Visit
Admission: RE | Admit: 2018-06-18 | Discharge: 2018-06-18 | Disposition: A | Discharge status: Home / Self Care | Payer: Medicare Other | Source: Ambulatory Visit | Attending: Internal Medicine | Admitting: Internal Medicine

## 2018-06-18 DIAGNOSIS — Z5111 Encounter for antineoplastic chemotherapy: Secondary | ICD-10-CM | POA: Diagnosis not present

## 2018-06-18 DIAGNOSIS — C3412 Malignant neoplasm of upper lobe, left bronchus or lung: Secondary | ICD-10-CM | POA: Insufficient documentation

## 2018-06-18 DIAGNOSIS — C3490 Malignant neoplasm of unspecified part of unspecified bronchus or lung: Secondary | ICD-10-CM | POA: Diagnosis not present

## 2018-06-18 LAB — BUN & CREATININE (CHCC)
BUN: 18 mg/dL (ref 8–23)
CREATININE: 1.05 mg/dL — AB (ref 0.44–1.00)
GFR, EST NON AFRICAN AMERICAN: 51 mL/min — AB (ref >60)
GFR, Est AFR Am: 59 mL/min — ABNORMAL LOW (ref >60)

## 2018-06-18 MED ORDER — IOHEXOL 300 MG/ML  SOLN
75.0000 mL | Freq: Once | INTRAMUSCULAR | Status: AC | PRN
  Administered 2018-06-18: 75 mL via INTRAVENOUS

## 2018-06-18 MED ORDER — SODIUM CHLORIDE (PF) 0.9 % IJ SOLN
INTRAMUSCULAR | Status: AC
  Filled 2018-06-18: qty 50

## 2018-06-19 ENCOUNTER — Telehealth: Payer: Self-pay | Admitting: *Deleted

## 2018-06-19 NOTE — Telephone Encounter (Signed)
Called the patient to let her know that her case would be discussed in conference with Dr. Lisbeth Renshaw and Bryson Ha.  I also let her know that Bryson Ha would give her a phone call with the results of her CT scan.  Will continue to follow as necessary.  [name]. Leonie Green, BSN

## 2018-06-21 ENCOUNTER — Telehealth: Payer: Self-pay | Admitting: Radiation Oncology

## 2018-06-21 ENCOUNTER — Other Ambulatory Visit: Payer: Self-pay | Admitting: *Deleted

## 2018-06-21 ENCOUNTER — Other Ambulatory Visit: Payer: Self-pay | Admitting: Radiation Oncology

## 2018-06-21 NOTE — Telephone Encounter (Signed)
LM for pt's son

## 2018-06-21 NOTE — Progress Notes (Signed)
The proposed treatment discussed in cancer conference 06/21/18 is for discussion purpose only and is not a binding recommendation. The patient was not physically examined nor present therefore, final treatment plans cannot be decided.  

## 2018-06-21 NOTE — Telephone Encounter (Signed)
I called the patient and had to leave a message for her too

## 2018-06-25 ENCOUNTER — Ambulatory Visit: Admission: RE | Admit: 2018-06-25 | Payer: Medicare Other | Source: Ambulatory Visit | Admitting: Radiation Oncology

## 2018-06-25 ENCOUNTER — Telehealth: Payer: Self-pay | Admitting: Radiation Oncology

## 2018-06-25 DIAGNOSIS — C3412 Malignant neoplasm of upper lobe, left bronchus or lung: Secondary | ICD-10-CM

## 2018-06-25 NOTE — Telephone Encounter (Signed)
I spoke with the patient to make sure she had received my message. She is in agreement to proceed with repeat scan in 4 months and come back sooner if she has concerns. We will cancel her appt for today.

## 2018-06-27 ENCOUNTER — Encounter: Payer: Self-pay | Admitting: *Deleted

## 2018-06-27 NOTE — Progress Notes (Signed)
Oncology Nurse Navigator Documentation  Oncology Nurse Navigator Flowsheets 06/27/2018  Navigator Location [facility]  Navigator Encounter Type Other/I followed up on [name]'s schedule.  She needs to see [name] in March 2020 with ct scan and labs.  I completed scheduling message.    Treatment Phase Follow-up  Barriers/Navigation Needs Coordination of Care  Interventions Coordination of Care  Coordination of Care Other  Acuity Level 2  Time Spent with Patient 30

## 2018-06-28 ENCOUNTER — Ambulatory Visit (INDEPENDENT_AMBULATORY_CARE_PROVIDER_SITE_OTHER): Payer: Medicare Other | Admitting: Pharmacist

## 2018-06-28 VITALS — BP 190/88 | HR 64

## 2018-06-28 DIAGNOSIS — I10 Essential (primary) hypertension: Secondary | ICD-10-CM | POA: Diagnosis not present

## 2018-06-28 DIAGNOSIS — I2510 Atherosclerotic heart disease of native coronary artery without angina pectoris: Secondary | ICD-10-CM | POA: Diagnosis not present

## 2018-06-28 MED ORDER — CARVEDILOL 25 MG PO TABS: 25.0000 mg | ORAL_TABLET | Freq: Two times a day (BID) | ORAL | 11 refills | Status: DC

## 2018-06-28 NOTE — Patient Instructions (Signed)
Restart taking amlodipine 5mg  daily (you can cut your 10mg  tablets in half)  Stop taking metoprolol  Start taking carvedilol 25mg  twice daily  Continue taking losartan 50mg  twice daily  I will send a message to Dr Meda Coffee about your heart rate and see if she would like to set up a monitor  Follow up in clinic in 3 weeks for a blood pressure check

## 2018-06-28 NOTE — Progress Notes (Signed)
Patient ID: QUANIKA SOLEM                 DOB: [birth year]                      MRN: [id number]     HPI: [name] is a 77 y.o. female referred by Dr. Meda Coffee to HTN clinic. PMH is significant for CAD, CKD stage III, carotid ultrasound showing 1-39% stenosis, HTN, lung cancer, LBBB, GERD. Coronary CTA with FFR in 09/2017 was abnormal. Cath 09/21/17 showed segmental proximal to mid 90% RCA stenosis s/p overlapping DES. Hospital course complicated by afib RVR then Vfib arrest, relook cath showed acute vessel closure and pt was restented. Further complicated by hemorrhagic anemia and multiple transfusions. Bleeding was related to dual antiplatelet therapy on top of gastric ulcers, colonic ulcers, and multiple small bowel AVM/bleeding lesions. Ultimately all antiplatelet therapy was discontinued and bleeding stopped. At last visit in HTN clinic 1 month ago, BP remained elevated and amlodipine was increased to 10mg  daily. She does have white coat HTN.  Pt states she only took the higher dose of amlodipine 10mg  twice. Reports she stopped taking it because it made her heart rate increase from 60s to 115-145. She felt very fatigued and a little more SOB than normal. Her BP has increased at home from 500-938 systolic since stopping amlodipine. Higher BP accompanied by headaches. Her HR decreased back to the 60s a few times, then increased back to the 115s through yesterday. HR normal again today. She has also had issues getting her metoprolol refilled, however reports that the few doses she missed did not correlated with her higher HR.  Exercise is minimal due to back pain and DJD. She uses Tylenol for pain as well as etodolac. Discussed limiting use of etodolac due to potential for increase in BP, bleeding, and renal dysfunction. Reports Dr Meda Coffee is ok with systolic BP up to 182 given age. Reports she is sensitive to medication changes. She does have a history of white coat HTN. Home BP cuff measures accurately  so medication changes will be made primarily based on home readings.   Current HTN meds: amlodipine 10mg  daily - stopped taking, losartan 50mg  BID, metoprolol tartrate 100mg  BID  BP goal: <993/71IRCV - ok with systolic BP up to 893 per Dr Meda Coffee.  Family History: The patient's family history includes Asthma in her mother; Cancer in her maternal grandfather and maternal uncle; Heart disease in her mother; Thyroid disease in her daughter.  Social History: Former smoker 1 PPD for 17 years, quit in 1977. Drinks a glass of wine daily, denies illicit drug use. Lives by herself.  Diet:  Breakfast - eggs and toast, cereal, yogurt, or muffin. OJ or coffee Lunch - sandwich with protein, sometimes egg salad Dinner - Lean Cuisine Caffeine - 1 cup of coffee each day, tea sometimes  Exercise: None - back issues and DJD  Home BP readings: higher since stopping amlodipine - 140/80s - 160s/80s, 174/96, 190/88  Wt Readings from Last 3 Encounters:  04/05/18 126 lb (57.2 kg)  03/19/18 125 lb 9.6 oz (57 kg)  01/16/18 126 lb 12.8 oz (57.5 kg)   BP Readings from Last 3 Encounters:  05/29/18 (!) 170/86  04/27/18 (!) 150/72  04/07/18 (!) 206/74   Pulse Readings from Last 3 Encounters:  05/29/18 69  04/27/18 68  04/07/18 77    Renal function: CrCl cannot be calculated (Unknown ideal weight.).  Past Medical History:  Diagnosis Date  . Anemia 09/2017  . Arthritis    back.and feet  . Cervical ca ([place]) dx'd 1988   surg only  . CKD (chronic kidney disease), stage III ([place])   . Complication of anesthesia    difficulty awakening, dysrhythmia post surgery, (prolonged sedation level)  . Coronary artery calcification seen on CT scan   . Dyspnea   . GERD (gastroesophageal reflux disease)   . Headache    occ. sinus type headaches  . History of hiatal hernia   . Hx of radiation therapy 09/16/13-09/30/13   prevascular lymph node  . Hyperlipidemia   . Hypertension   . Hyperthyroidism    dr Loanne Drilling-  radioactive iodine treatment 2011- resolved  . Hypothyroidism   . LBBB (left bundle branch block)   . Lung cancer ([place])    Bilaterally, RUL lobectomy and spot involving both lungs- "cancer"  . Macular degeneration    bilateral  . Melanoma ([place])    left leg  . Radiation 10/11/11-10/21/11   Left upper lobe adenocarcinoma  . Tremor    hand- intention tremor    Current Outpatient Medications on File Prior to Visit  Medication Sig Dispense Refill  . acetaminophen (TYLENOL) 650 MG CR tablet Take 1,300 mg by mouth 2 (two) times daily.     Marland Kitchen amLODipine (NORVASC) 10 MG tablet Take 1 tablet (10 mg total) by mouth daily. 30 tablet 11  . atorvastatin (LIPITOR) 80 MG tablet Take 1 tablet (80 mg total) by mouth daily. 90 tablet 3  . Calcium Carbonate-Vitamin D3 (CALCIUM 600-D) 600-400 MG-UNIT TABS Take 2 tablets by mouth daily.     . ferrous sulfate 325 (65 FE) MG tablet Take 325 mg by mouth daily with breakfast.    . fluticasone (FLONASE) 50 MCG/ACT nasal spray Place 1 spray into both nostrils daily as needed for allergies.     Marland Kitchen gabapentin (NEURONTIN) 300 MG capsule Take 600 mg by mouth at bedtime.   2  . levothyroxine (SYNTHROID, LEVOTHROID) 88 MCG tablet     . lidocaine (LIDODERM) 5 % Place 1 patch onto the skin daily. Remove & Discard patch within 12 hours or as directed by MD 30 patch 0  . losartan (COZAAR) 50 MG tablet Take 1 tablet (50 mg total) by mouth 2 (two) times daily. 180 tablet 3  . metoprolol tartrate (LOPRESSOR) 100 MG tablet Take 1 tablet (100 mg total) by mouth 2 (two) times daily.    . Multiple Vitamin (MULTIVITAMIN WITH MINERALS) TABS tablet Take 1 tablet by mouth daily.    . Multiple Vitamins-Minerals (PRESERVISION AREDS 2 PO) Take 1 capsule by mouth 2 (two) times daily.    . nitroGLYCERIN (NITROSTAT) 0.4 MG SL tablet Place 1 tablet (0.4 mg total) under the tongue every 5 (five) minutes as needed for chest pain.  0  . ondansetron (ZOFRAN ODT) 4 MG disintegrating tablet Take 1  tablet (4 mg total) by mouth every 8 (eight) hours as needed for nausea or vomiting. 10 tablet 0  . ondansetron (ZOFRAN) 8 MG tablet Take 1 tablet by mouth as needed.  1  . pantoprazole (PROTONIX) 40 MG tablet Take 1 tablet (40 mg total) by mouth 2 (two) times daily.     No current facility-administered medications on file prior to visit.     Allergies  Allergen Reactions  . Meperidine Hcl Other (See Comments)    "knocked pt out for 4 days"  . Morphine Other (See Comments)    Stopped breathing  due to too high of a dose per patient.  Not an allergic reaction to morphine  . Penicillins Anaphylaxis, Shortness Of Breath and Other (See Comments)    Difficulty breathing  . Oxycodone-Acetaminophen Itching       . Propoxyphene N-Acetaminophen Nausea And Vomiting  . Tramadol Hcl Nausea And Vomiting     Assessment/Plan:  1. Hypertension - BP very elevated in clinic today above normal white coat HTN readings secondary to pt stopping amlodipine. Discussed that amlodipine does not affect HR and should not have been the cause of her HR increase from 60s to 115-145. Since her symptoms also correlated with fatigue, suspect afib. Will route message to Dr Meda Coffee to see if she would like an outpatient holter monitor ordered. HR in rhythm today when BP was checked. Will also resume amlodipine 5mg  daily. Stop metoprolol and start equivalent dose of carvedilol 25mg  BID for more effective BP lowering. F/u in HTN clinic in 2-3 weeks for BP check.   Fahim Kats E. Magaby Rumberger, PharmD, BCACP, [facility] [address], [place], [place] [address] Phone: [phone]; Fax: [phone] 06/28/2018 8:58 AM

## 2018-07-17 ENCOUNTER — Telehealth: Payer: Self-pay | Admitting: *Deleted

## 2018-07-17 ENCOUNTER — Ambulatory Visit (INDEPENDENT_AMBULATORY_CARE_PROVIDER_SITE_OTHER): Payer: Medicare Other | Admitting: Pharmacist

## 2018-07-17 VITALS — BP 160/76 | HR 63

## 2018-07-17 DIAGNOSIS — R000 Tachycardia, unspecified: Secondary | ICD-10-CM

## 2018-07-17 DIAGNOSIS — R002 Palpitations: Secondary | ICD-10-CM

## 2018-07-17 DIAGNOSIS — I10 Essential (primary) hypertension: Secondary | ICD-10-CM | POA: Diagnosis not present

## 2018-07-17 DIAGNOSIS — R5383 Other fatigue: Secondary | ICD-10-CM

## 2018-07-17 NOTE — Patient Instructions (Signed)
Continue taking your current medications  I will follow up with Dr Meda Coffee regarding ordering an event monitor to detect any abnormal heart beats  We may also adjust the dose of your amlodipine. I will let you know by phone if we decide to increase this dose  Call your primary care doctor about your hand weakness

## 2018-07-17 NOTE — Progress Notes (Signed)
Patient ID: [name]                 DOB: 08/24/1940                      MRN: [id number]     HPI: [name] is a 78 y.o. female referred by Dr. Meda Coffee to HTN clinic. PMH is significant for CAD, CKD stage III, carotid ultrasound showing 1-39% stenosis, HTN, lung cancer, LBBB, GERD. Coronary CTA with FFR in 09/2017 was abnormal. Cath 09/21/17 showed segmental proximal to mid 90% RCA stenosis s/p overlapping DES. Hospital course complicated by afib RVR then Vfib arrest, relook cath showed acute vessel closure and pt was restented. Further complicated by hemorrhagic anemia and multiple transfusions. Bleeding was related to dual antiplatelet therapy on top of gastric ulcers, colonic ulcers, and multiple small bowel AVM/bleeding lesions. Ultimately all antiplatelet therapy was discontinued and bleeding stopped. At last visit in HTN clinic 1 month ago, BP remained elevated; amlodipine was restarted at 5 mg daily and metoprolol was switched to carvedilol 25 mg BID. She does have white coat HTN.  Patient presents today in good spirits, accompanied by her son. She denies dizziness, blurred vision, falls, headaches, imbalance. In clinic, patient BP read high at 170/90, then recheck 20 minutes later at 160/76. Patient checked BP three times at home since the last visit 3 weeks ago and two of the three readings were above goal. Patient reports getting the flu in the last week of December and took Coricidin HBP. Patient denies taking any other cold and cough products that would increase BP. Patient also reports few missed doses of BP meds with the flu. When inquired about any high HR at home since the last visit, she reports feeling high HRs at times but did not check her HR.  Patient voices a concern regarding her loss of left hand functionality which started around Christmas. Patient reports loss of dexterity and strength, which is worsening over time. Patient denies any left sided facial drooping, left sided  weakness/numbness, or speech difficulty. Pt advised to follow up with her PCP regarding this.  Exercise is minimal due to back pain and DJD. She uses Tylenol for pain as well as etodolac. Discussed limiting use of etodolac due to potential for increase in BP, bleeding, and renal dysfunction. Reports Dr Meda Coffee is ok with systolic BP up to 854 given age. Reports she is sensitive to medication changes. She does have a history of white coat HTN. Home BP cuff measures accurately so medication changes will be made primarily based on home readings.   Current HTN meds: carvedilol 25 mg BID, amlodipine 5 mg daily, losartan 50 mg BID BP goal: <627/[id number] - ok with systolic BP up to 381 per Dr Meda Coffee  Family History: The patient's family history includes Asthma in her mother; Cancer in her maternal grandfather and maternal uncle; Heart disease in her mother; Thyroid disease in her daughter.  Social History: Former smoker 1 PPD for 17 years, quit in 1977. Drinks a glass of wine daily, denies illicit drug use. Lives by herself.  Diet:  Breakfast - eggs and toast, cereal, yogurt, or muffin. OJ or coffee Lunch - sandwich with protein, sometimes egg salad Dinner - Lean Cuisine Caffeine - 1 cup of coffee each day, tea sometimes  Exercise: None - back issues and DJD  Home BP readings: (06/30/18) BP 154/81 HR 69; (07/07/18) BP 161/78 HR 78; (07/08/18) BP 120/78 HR 72  Wt Readings from Last 3 Encounters:  04/05/18 126 lb (57.2 kg)  03/19/18 125 lb 9.6 oz (57 kg)  01/16/18 126 lb 12.8 oz (57.5 kg)   BP Readings from Last 3 Encounters:  06/28/18 (!) 190/88  05/29/18 (!) 170/86  04/27/18 (!) 150/72   Pulse Readings from Last 3 Encounters:  06/28/18 64  05/29/18 69  04/27/18 68    Renal function: CrCl cannot be calculated (Patient's most recent lab result is older than the maximum 21 days allowed.).  Past Medical History:  Diagnosis Date  . Anemia 09/2017  . Arthritis    back.and feet  .  Cervical ca ([place]) dx'd 1988   surg only  . CKD (chronic kidney disease), stage III ([place])   . Complication of anesthesia    difficulty awakening, dysrhythmia post surgery, (prolonged sedation level)  . Coronary artery calcification seen on CT scan   . Dyspnea   . GERD (gastroesophageal reflux disease)   . Headache    occ. sinus type headaches  . History of hiatal hernia   . Hx of radiation therapy 09/16/13-09/30/13   prevascular lymph node  . Hyperlipidemia   . Hypertension   . Hyperthyroidism    dr Loanne Drilling- radioactive iodine treatment 2011- resolved  . Hypothyroidism   . LBBB (left bundle branch block)   . Lung cancer ([place])    Bilaterally, RUL lobectomy and spot involving both lungs- "cancer"  . Macular degeneration    bilateral  . Melanoma ([place])    left leg  . Radiation 10/11/11-10/21/11   Left upper lobe adenocarcinoma  . Tremor    hand- intention tremor    Current Outpatient Medications on File Prior to Visit  Medication Sig Dispense Refill  . acetaminophen (TYLENOL) 650 MG CR tablet Take 1,300 mg by mouth 2 (two) times daily.     Marland Kitchen amLODipine (NORVASC) 10 MG tablet Take 0.5 tablets (5 mg total) by mouth daily. 30 tablet 11  . atorvastatin (LIPITOR) 80 MG tablet Take 1 tablet (80 mg total) by mouth daily. 90 tablet 3  . Calcium Carbonate-Vitamin D3 (CALCIUM 600-D) 600-400 MG-UNIT TABS Take 2 tablets by mouth daily.     . carvedilol (COREG) 25 MG tablet Take 1 tablet (25 mg total) by mouth 2 (two) times daily. 60 tablet 11  . ferrous sulfate 325 (65 FE) MG tablet Take 325 mg by mouth daily with breakfast.    . fluticasone (FLONASE) 50 MCG/ACT nasal spray Place 1 spray into both nostrils daily as needed for allergies.     Marland Kitchen gabapentin (NEURONTIN) 300 MG capsule Take 600 mg by mouth at bedtime.   2  . levothyroxine (SYNTHROID, LEVOTHROID) 88 MCG tablet     . lidocaine (LIDODERM) 5 % Place 1 patch onto the skin daily. Remove & Discard patch within 12 hours or as directed by MD 30  patch 0  . losartan (COZAAR) 50 MG tablet Take 1 tablet (50 mg total) by mouth 2 (two) times daily. 180 tablet 3  . Multiple Vitamin (MULTIVITAMIN WITH MINERALS) TABS tablet Take 1 tablet by mouth daily.    . Multiple Vitamins-Minerals (PRESERVISION AREDS 2 PO) Take 1 capsule by mouth 2 (two) times daily.    . nitroGLYCERIN (NITROSTAT) 0.4 MG SL tablet Place 1 tablet (0.4 mg total) under the tongue every 5 (five) minutes as needed for chest pain.  0  . ondansetron (ZOFRAN ODT) 4 MG disintegrating tablet Take 1 tablet (4 mg total) by mouth every 8 (eight) hours  as needed for nausea or vomiting. 10 tablet 0  . ondansetron (ZOFRAN) 8 MG tablet Take 1 tablet by mouth as needed.  1  . pantoprazole (PROTONIX) 40 MG tablet Take 1 tablet (40 mg total) by mouth 2 (two) times daily.     No current facility-administered medications on file prior to visit.     Allergies  Allergen Reactions  . Meperidine Hcl Other (See Comments)    "knocked pt out for 4 days"  . Morphine Other (See Comments)    Stopped breathing due to too high of a dose per patient.  Not an allergic reaction to morphine  . Penicillins Anaphylaxis, Shortness Of Breath and Other (See Comments)    Difficulty breathing  . Oxycodone-Acetaminophen Itching       . Propoxyphene N-Acetaminophen Nausea And Vomiting  . Tramadol Hcl Nausea And Vomiting     Assessment/Plan:  1. Hypertension - BP is elevated in clinic today due to white coat HTN. HR in the 60s today, however pt continues to report sporadic HR elevations (previously 120-140) accompanied by fatigue. Suspect potential afib, will route message to Dr Meda Coffee regarding potential holter monitor. Discussed increasing amlodipine since home BP readings are reading above goal as well, however patient desires to hear back from Dr. Meda Coffee regarding her high HRs at home prior to making any medication changes. Will continue amlodipine 5 mg daily, carvedilol 25 mg BID and losartan 50 mg BID. A  message has been sent to Dr. Meda Coffee and will call the patient in few days to adjust dose of amlodipine if agreed by Dr. Meda Coffee. Advised the patient to check BP more frequently at home in order to make the best adjustment to her BP medications and re-iterated proper BP measuring technique. She will keep f/u with Dr Meda Coffee at the end of January.  Seen in clinic by Almira Bar, PharmD Candidate   E. Supple, PharmD, BCACP, [facility] [address] [place], [place] [address] Phone: [phone]; Fax: [phone] 07/17/2018 3:24 PM

## 2018-07-17 NOTE — Telephone Encounter (Signed)
-----   Message from Dorothy Spark, MD sent at 07/17/2018  3:30 PM EST ----- Thank you! I would have her try a 30 day event monitor - one of those patch ones, Chava Dulac, can you arrange? Thank you [place] Siren ----- Message ----- From: Leeroy Bock, [facility] Sent: 07/17/2018   3:29 PM EST To: Dorothy Spark, MD  Please see note from today - pt has noted HR increase occasionally to 120-140s accompanied by fatigue. She did have brief afib in the hospital earlier this year. I was hoping for your thoughts on whether or not she needed a holter monitor. She associated her elevated HR with her amlodipine dose increase so she is hesitant today to increase her dose again despite discussion that her amlodipine did not increase her HR. She is hoping to hear from you regarding your thoughts on increasing her amlodipine as well. Thank you!

## 2018-07-17 NOTE — Telephone Encounter (Signed)
Left a message on both of the pts contact numbers to call the office back, to discuss recommendation to place an event monitor on the pt, per Dr Meda Coffee.

## 2018-07-18 NOTE — Telephone Encounter (Signed)
Spoke with the pt and informed her that per Dr Meda Coffee, based on her OV with Fuller [place] PharmD, she recommends that we order for her to get an event monitor placed, to further evaluate her symptoms of elevated HR and feeling fatigue.  Informed the pt that I will place the referral in the system and send a message to our [facility] schedulers to call her back and arrange this appt.  Pt verbalized understanding and agrees with this plan.

## 2018-07-18 NOTE — Telephone Encounter (Addendum)
Left a message for the pt to call back, to endorse recommendations per Dr Meda Coffee.  2nd attempt at trying to make contact with the pt.

## 2018-07-19 NOTE — Telephone Encounter (Signed)
Pts event monitor is scheduled for 07/25/18.  Pt made aware of appt date and time by [facility] scheduling.

## 2018-07-20 DIAGNOSIS — L905 Scar conditions and fibrosis of skin: Secondary | ICD-10-CM | POA: Diagnosis not present

## 2018-07-20 DIAGNOSIS — L570 Actinic keratosis: Secondary | ICD-10-CM | POA: Diagnosis not present

## 2018-07-20 DIAGNOSIS — Z85828 Personal history of other malignant neoplasm of skin: Secondary | ICD-10-CM | POA: Diagnosis not present

## 2018-07-21 ENCOUNTER — Encounter (HOSPITAL_COMMUNITY): Payer: Self-pay | Admitting: Emergency Medicine

## 2018-07-21 ENCOUNTER — Inpatient Hospital Stay (HOSPITAL_COMMUNITY): Payer: Medicare Other

## 2018-07-21 ENCOUNTER — Emergency Department (HOSPITAL_COMMUNITY): Payer: Medicare Other

## 2018-07-21 ENCOUNTER — Inpatient Hospital Stay (HOSPITAL_COMMUNITY)
Admission: EM | Admit: 2018-07-21 | Discharge: 2018-07-24 | DRG: 054 | Disposition: A | Discharge status: Rehabilitation Facility | Payer: Medicare Other | Attending: Internal Medicine | Admitting: Internal Medicine

## 2018-07-21 DIAGNOSIS — C3491 Malignant neoplasm of unspecified part of right bronchus or lung: Secondary | ICD-10-CM | POA: Diagnosis not present

## 2018-07-21 DIAGNOSIS — C3410 Malignant neoplasm of upper lobe, unspecified bronchus or lung: Secondary | ICD-10-CM | POA: Diagnosis not present

## 2018-07-21 DIAGNOSIS — R51 Headache: Secondary | ICD-10-CM | POA: Diagnosis not present

## 2018-07-21 DIAGNOSIS — Z955 Presence of coronary angioplasty implant and graft: Secondary | ICD-10-CM | POA: Diagnosis not present

## 2018-07-21 DIAGNOSIS — Z87891 Personal history of nicotine dependence: Secondary | ICD-10-CM | POA: Diagnosis not present

## 2018-07-21 DIAGNOSIS — Z515 Encounter for palliative care: Secondary | ICD-10-CM | POA: Diagnosis not present

## 2018-07-21 DIAGNOSIS — E039 Hypothyroidism, unspecified: Secondary | ICD-10-CM | POA: Diagnosis present

## 2018-07-21 DIAGNOSIS — Z85118 Personal history of other malignant neoplasm of bronchus and lung: Secondary | ICD-10-CM | POA: Diagnosis not present

## 2018-07-21 DIAGNOSIS — Z902 Acquired absence of lung [part of]: Secondary | ICD-10-CM | POA: Diagnosis not present

## 2018-07-21 DIAGNOSIS — D72829 Elevated white blood cell count, unspecified: Secondary | ICD-10-CM | POA: Diagnosis not present

## 2018-07-21 DIAGNOSIS — I700 Atherosclerosis of aorta: Secondary | ICD-10-CM | POA: Diagnosis present

## 2018-07-21 DIAGNOSIS — R000 Tachycardia, unspecified: Secondary | ICD-10-CM | POA: Diagnosis not present

## 2018-07-21 DIAGNOSIS — K219 Gastro-esophageal reflux disease without esophagitis: Secondary | ICD-10-CM | POA: Diagnosis present

## 2018-07-21 DIAGNOSIS — M792 Neuralgia and neuritis, unspecified: Secondary | ICD-10-CM | POA: Diagnosis not present

## 2018-07-21 DIAGNOSIS — Z825 Family history of asthma and other chronic lower respiratory diseases: Secondary | ICD-10-CM

## 2018-07-21 DIAGNOSIS — I639 Cerebral infarction, unspecified: Secondary | ICD-10-CM | POA: Diagnosis not present

## 2018-07-21 DIAGNOSIS — E785 Hyperlipidemia, unspecified: Secondary | ICD-10-CM | POA: Diagnosis present

## 2018-07-21 DIAGNOSIS — C3490 Malignant neoplasm of unspecified part of unspecified bronchus or lung: Secondary | ICD-10-CM | POA: Diagnosis not present

## 2018-07-21 DIAGNOSIS — Z880 Allergy status to penicillin: Secondary | ICD-10-CM | POA: Diagnosis not present

## 2018-07-21 DIAGNOSIS — R4781 Slurred speech: Secondary | ICD-10-CM | POA: Diagnosis not present

## 2018-07-21 DIAGNOSIS — M1990 Unspecified osteoarthritis, unspecified site: Secondary | ICD-10-CM | POA: Diagnosis present

## 2018-07-21 DIAGNOSIS — K449 Diaphragmatic hernia without obstruction or gangrene: Secondary | ICD-10-CM | POA: Diagnosis present

## 2018-07-21 DIAGNOSIS — C3412 Malignant neoplasm of upper lobe, left bronchus or lung: Secondary | ICD-10-CM | POA: Diagnosis present

## 2018-07-21 DIAGNOSIS — Z923 Personal history of irradiation: Secondary | ICD-10-CM | POA: Diagnosis not present

## 2018-07-21 DIAGNOSIS — G8190 Hemiplegia, unspecified affecting unspecified side: Secondary | ICD-10-CM | POA: Diagnosis not present

## 2018-07-21 DIAGNOSIS — G936 Cerebral edema: Secondary | ICD-10-CM | POA: Diagnosis present

## 2018-07-21 DIAGNOSIS — Z7951 Long term (current) use of inhaled steroids: Secondary | ICD-10-CM | POA: Diagnosis not present

## 2018-07-21 DIAGNOSIS — Z79890 Hormone replacement therapy (postmenopausal): Secondary | ICD-10-CM

## 2018-07-21 DIAGNOSIS — H3530 Unspecified macular degeneration: Secondary | ICD-10-CM | POA: Diagnosis present

## 2018-07-21 DIAGNOSIS — I2510 Atherosclerotic heart disease of native coronary artery without angina pectoris: Secondary | ICD-10-CM | POA: Diagnosis present

## 2018-07-21 DIAGNOSIS — W010XXA Fall on same level from slipping, tripping and stumbling without subsequent striking against object, initial encounter: Secondary | ICD-10-CM | POA: Diagnosis present

## 2018-07-21 DIAGNOSIS — Z801 Family history of malignant neoplasm of trachea, bronchus and lung: Secondary | ICD-10-CM

## 2018-07-21 DIAGNOSIS — I447 Left bundle-branch block, unspecified: Secondary | ICD-10-CM | POA: Diagnosis present

## 2018-07-21 DIAGNOSIS — R29898 Other symptoms and signs involving the musculoskeletal system: Secondary | ICD-10-CM | POA: Diagnosis not present

## 2018-07-21 DIAGNOSIS — I10 Essential (primary) hypertension: Secondary | ICD-10-CM

## 2018-07-21 DIAGNOSIS — Z886 Allergy status to analgesic agent status: Secondary | ICD-10-CM

## 2018-07-21 DIAGNOSIS — I739 Peripheral vascular disease, unspecified: Secondary | ICD-10-CM | POA: Diagnosis not present

## 2018-07-21 DIAGNOSIS — Z79899 Other long term (current) drug therapy: Secondary | ICD-10-CM | POA: Diagnosis not present

## 2018-07-21 DIAGNOSIS — Z885 Allergy status to narcotic agent status: Secondary | ICD-10-CM

## 2018-07-21 DIAGNOSIS — Z8541 Personal history of malignant neoplasm of cervix uteri: Secondary | ICD-10-CM

## 2018-07-21 DIAGNOSIS — Z23 Encounter for immunization: Secondary | ICD-10-CM

## 2018-07-21 DIAGNOSIS — N183 Chronic kidney disease, stage 3 unspecified: Secondary | ICD-10-CM | POA: Diagnosis present

## 2018-07-21 DIAGNOSIS — Z9841 Cataract extraction status, right eye: Secondary | ICD-10-CM

## 2018-07-21 DIAGNOSIS — Z9842 Cataract extraction status, left eye: Secondary | ICD-10-CM | POA: Diagnosis not present

## 2018-07-21 DIAGNOSIS — C7931 Secondary malignant neoplasm of brain: Secondary | ICD-10-CM | POA: Diagnosis not present

## 2018-07-21 DIAGNOSIS — R29810 Facial weakness: Secondary | ICD-10-CM | POA: Diagnosis not present

## 2018-07-21 DIAGNOSIS — Z66 Do not resuscitate: Secondary | ICD-10-CM | POA: Diagnosis present

## 2018-07-21 DIAGNOSIS — Z85820 Personal history of malignant melanoma of skin: Secondary | ICD-10-CM

## 2018-07-21 DIAGNOSIS — M79609 Pain in unspecified limb: Secondary | ICD-10-CM | POA: Diagnosis not present

## 2018-07-21 DIAGNOSIS — R2689 Other abnormalities of gait and mobility: Secondary | ICD-10-CM | POA: Diagnosis present

## 2018-07-21 DIAGNOSIS — E782 Mixed hyperlipidemia: Secondary | ICD-10-CM | POA: Diagnosis not present

## 2018-07-21 DIAGNOSIS — E890 Postprocedural hypothyroidism: Secondary | ICD-10-CM | POA: Diagnosis present

## 2018-07-21 DIAGNOSIS — Z7952 Long term (current) use of systemic steroids: Secondary | ICD-10-CM | POA: Diagnosis not present

## 2018-07-21 DIAGNOSIS — I129 Hypertensive chronic kidney disease with stage 1 through stage 4 chronic kidney disease, or unspecified chronic kidney disease: Secondary | ICD-10-CM | POA: Diagnosis present

## 2018-07-21 DIAGNOSIS — C3411 Malignant neoplasm of upper lobe, right bronchus or lung: Secondary | ICD-10-CM | POA: Diagnosis not present

## 2018-07-21 DIAGNOSIS — I779 Disorder of arteries and arterioles, unspecified: Secondary | ICD-10-CM | POA: Diagnosis present

## 2018-07-21 DIAGNOSIS — G8194 Hemiplegia, unspecified affecting left nondominant side: Secondary | ICD-10-CM | POA: Diagnosis present

## 2018-07-21 DIAGNOSIS — Z888 Allergy status to other drugs, medicaments and biological substances status: Secondary | ICD-10-CM

## 2018-07-21 DIAGNOSIS — M79652 Pain in left thigh: Secondary | ICD-10-CM | POA: Diagnosis present

## 2018-07-21 DIAGNOSIS — R531 Weakness: Secondary | ICD-10-CM | POA: Diagnosis not present

## 2018-07-21 DIAGNOSIS — R7989 Other specified abnormal findings of blood chemistry: Secondary | ICD-10-CM | POA: Diagnosis not present

## 2018-07-21 DIAGNOSIS — G629 Polyneuropathy, unspecified: Secondary | ICD-10-CM | POA: Diagnosis present

## 2018-07-21 DIAGNOSIS — Z8249 Family history of ischemic heart disease and other diseases of the circulatory system: Secondary | ICD-10-CM | POA: Diagnosis not present

## 2018-07-21 LAB — DIFFERENTIAL
Abs Immature Granulocytes: 0.06 10*3/uL (ref 0.00–0.07)
Basophils Absolute: 0.1 10*3/uL (ref 0.0–0.1)
Basophils Relative: 1 %
Eosinophils Absolute: 0.5 10*3/uL (ref 0.0–0.5)
Eosinophils Relative: 4 %
Immature Granulocytes: 1 %
LYMPHS ABS: 1 10*3/uL (ref 0.7–4.0)
Lymphocytes Relative: 8 %
MONOS PCT: 9 %
Monocytes Absolute: 1.2 10*3/uL — ABNORMAL HIGH (ref 0.1–1.0)
Neutro Abs: 10 10*3/uL — ABNORMAL HIGH (ref 1.7–7.7)
Neutrophils Relative %: 77 %

## 2018-07-21 LAB — URINALYSIS, ROUTINE W REFLEX MICROSCOPIC
Bilirubin Urine: NEGATIVE
Glucose, UA: NEGATIVE mg/dL
Hgb urine dipstick: NEGATIVE
Ketones, ur: NEGATIVE mg/dL
Leukocytes, UA: NEGATIVE
Nitrite: NEGATIVE
Protein, ur: NEGATIVE mg/dL
Specific Gravity, Urine: 1.012 (ref 1.005–1.030)
pH: 7 (ref 5.0–8.0)

## 2018-07-21 LAB — CBC
HCT: 39.9 % (ref 36.0–46.0)
Hemoglobin: 12.6 g/dL (ref 12.0–15.0)
MCH: 29.1 pg (ref 26.0–34.0)
MCHC: 31.6 g/dL (ref 30.0–36.0)
MCV: 92.1 fL (ref 80.0–100.0)
Platelets: 200 10*3/uL (ref 150–400)
RBC: 4.33 MIL/uL (ref 3.87–5.11)
RDW: 13.2 % (ref 11.5–15.5)
WBC: 12.7 10*3/uL — ABNORMAL HIGH (ref 4.0–10.5)
nRBC: 0 % (ref 0.0–0.2)

## 2018-07-21 LAB — I-STAT TROPONIN, ED: Troponin i, poc: 0.01 ng/mL (ref 0.00–0.08)

## 2018-07-21 LAB — I-STAT CHEM 8, ED
BUN: 20 mg/dL (ref 8–23)
Calcium, Ion: 1.27 mmol/L (ref 1.15–1.40)
Chloride: 105 mmol/L (ref 98–111)
Creatinine, Ser: 1 mg/dL (ref 0.44–1.00)
GLUCOSE: 128 mg/dL — AB (ref 70–99)
HCT: 39 % (ref 36.0–46.0)
Hemoglobin: 13.3 g/dL (ref 12.0–15.0)
Potassium: 3.8 mmol/L (ref 3.5–5.1)
Sodium: 139 mmol/L (ref 135–145)
TCO2: 27 mmol/L (ref 22–32)

## 2018-07-21 LAB — RAPID URINE DRUG SCREEN, HOSP PERFORMED
Amphetamines: NOT DETECTED
BARBITURATES: NOT DETECTED
Benzodiazepines: NOT DETECTED
Cocaine: NOT DETECTED
Opiates: POSITIVE — AB
Tetrahydrocannabinol: NOT DETECTED

## 2018-07-21 LAB — COMPREHENSIVE METABOLIC PANEL
ALK PHOS: 61 U/L (ref 38–126)
ALT: 21 U/L (ref 0–44)
AST: 20 U/L (ref 15–41)
Albumin: 3.6 g/dL (ref 3.5–5.0)
Anion gap: 10 (ref 5–15)
BUN: 17 mg/dL (ref 8–23)
CO2: 26 mmol/L (ref 22–32)
Calcium: 10.7 mg/dL — ABNORMAL HIGH (ref 8.9–10.3)
Chloride: 105 mmol/L (ref 98–111)
Creatinine, Ser: 1.05 mg/dL — ABNORMAL HIGH (ref 0.44–1.00)
GFR calc Af Amer: 59 mL/min — ABNORMAL LOW (ref >60)
GFR, EST NON AFRICAN AMERICAN: 51 mL/min — AB (ref >60)
Glucose, Bld: 130 mg/dL — ABNORMAL HIGH (ref 70–99)
Potassium: 3.7 mmol/L (ref 3.5–5.1)
Sodium: 141 mmol/L (ref 135–145)
TOTAL PROTEIN: 5.9 g/dL — AB (ref 6.5–8.1)
Total Bilirubin: 0.8 mg/dL (ref 0.3–1.2)

## 2018-07-21 LAB — PROTIME-INR
INR: 1.03
Prothrombin Time: 13.4 seconds (ref 11.4–15.2)

## 2018-07-21 LAB — APTT: aPTT: 30 seconds (ref 24–36)

## 2018-07-21 LAB — ETHANOL

## 2018-07-21 MED ORDER — NITROGLYCERIN 0.4 MG SL SUBL: 0.4000 mg | SUBLINGUAL_TABLET | SUBLINGUAL | Status: DC | PRN

## 2018-07-21 MED ORDER — ACETAMINOPHEN 500 MG PO TABS
1000.0000 mg | ORAL_TABLET | Freq: Once | ORAL | Status: DC
  Filled 2018-07-21: qty 2

## 2018-07-21 MED ORDER — DEXAMETHASONE SODIUM PHOSPHATE 10 MG/ML IJ SOLN
10.0000 mg | Freq: Once | INTRAMUSCULAR | Status: AC
  Administered 2018-07-21: 10 mg via INTRAVENOUS
  Filled 2018-07-21: qty 1

## 2018-07-21 MED ORDER — AMLODIPINE BESYLATE 5 MG PO TABS
5.0000 mg | ORAL_TABLET | Freq: Every day | ORAL | Status: DC
  Administered 2018-07-21 – 2018-07-23 (×3): 5 mg via ORAL
  Filled 2018-07-21 (×3): qty 1

## 2018-07-21 MED ORDER — ATORVASTATIN CALCIUM 80 MG PO TABS
80.0000 mg | ORAL_TABLET | Freq: Every day | ORAL | Status: DC
  Filled 2018-07-21: qty 1

## 2018-07-21 MED ORDER — ADULT MULTIVITAMIN W/MINERALS CH
1.0000 | ORAL_TABLET | Freq: Every day | ORAL | Status: DC
  Administered 2018-07-21 – 2018-07-24 (×4): 1 via ORAL
  Filled 2018-07-21 (×4): qty 1

## 2018-07-21 MED ORDER — GADOBUTROL 1 MMOL/ML IV SOLN
5.0000 mL | Freq: Once | INTRAVENOUS | Status: AC | PRN
  Administered 2018-07-21: 5 mL via INTRAVENOUS

## 2018-07-21 MED ORDER — PANTOPRAZOLE SODIUM 40 MG PO TBEC
40.0000 mg | DELAYED_RELEASE_TABLET | Freq: Two times a day (BID) | ORAL | Status: DC
  Administered 2018-07-21 – 2018-07-24 (×7): 40 mg via ORAL
  Filled 2018-07-21 (×7): qty 1

## 2018-07-21 MED ORDER — ETODOLAC 400 MG PO TABS
400.0000 mg | ORAL_TABLET | Freq: Two times a day (BID) | ORAL | Status: DC
  Administered 2018-07-21 – 2018-07-24 (×7): 400 mg via ORAL
  Filled 2018-07-21 (×8): qty 1

## 2018-07-21 MED ORDER — ATORVASTATIN CALCIUM 80 MG PO TABS
80.0000 mg | ORAL_TABLET | Freq: Every day | ORAL | Status: DC
  Administered 2018-07-21 – 2018-07-23 (×3): 80 mg via ORAL
  Filled 2018-07-21 (×3): qty 1

## 2018-07-21 MED ORDER — GABAPENTIN 300 MG PO CAPS
600.0000 mg | ORAL_CAPSULE | Freq: Every day | ORAL | Status: DC
  Administered 2018-07-21 – 2018-07-23 (×3): 600 mg via ORAL
  Filled 2018-07-21 (×3): qty 2

## 2018-07-21 MED ORDER — LOSARTAN POTASSIUM 50 MG PO TABS
50.0000 mg | ORAL_TABLET | Freq: Two times a day (BID) | ORAL | Status: DC
  Administered 2018-07-21 – 2018-07-24 (×7): 50 mg via ORAL
  Filled 2018-07-21 (×7): qty 1

## 2018-07-21 MED ORDER — DEXAMETHASONE 4 MG PO TABS
4.0000 mg | ORAL_TABLET | Freq: Four times a day (QID) | ORAL | Status: DC
  Administered 2018-07-21 – 2018-07-24 (×12): 4 mg via ORAL
  Filled 2018-07-21 (×13): qty 1

## 2018-07-21 MED ORDER — LEVOTHYROXINE SODIUM 88 MCG PO TABS
88.0000 ug | ORAL_TABLET | Freq: Every day | ORAL | Status: DC
  Administered 2018-07-21 – 2018-07-24 (×3): 88 ug via ORAL
  Filled 2018-07-21 (×3): qty 1

## 2018-07-21 MED ORDER — ACETAMINOPHEN 325 MG PO TABS
650.0000 mg | ORAL_TABLET | Freq: Four times a day (QID) | ORAL | Status: DC
  Administered 2018-07-21 – 2018-07-24 (×12): 650 mg via ORAL
  Filled 2018-07-21 (×13): qty 2

## 2018-07-21 MED ORDER — CARVEDILOL 12.5 MG PO TABS
25.0000 mg | ORAL_TABLET | Freq: Two times a day (BID) | ORAL | Status: DC
  Administered 2018-07-21 (×2): 25 mg via ORAL
  Administered 2018-07-22: 12.5 mg via ORAL
  Administered 2018-07-22 – 2018-07-24 (×4): 25 mg via ORAL
  Filled 2018-07-21 (×7): qty 2

## 2018-07-21 MED ORDER — KETOTIFEN FUMARATE 0.025 % OP SOLN
1.0000 [drp] | Freq: Two times a day (BID) | OPHTHALMIC | Status: DC | PRN
  Filled 2018-07-21: qty 5

## 2018-07-21 MED ORDER — ONDANSETRON 4 MG PO TBDP: 4.0000 mg | ORAL_TABLET | Freq: Three times a day (TID) | ORAL | Status: DC | PRN

## 2018-07-21 MED ORDER — MORPHINE SULFATE 15 MG PO TABS
7.5000 mg | ORAL_TABLET | ORAL | Status: DC | PRN
  Administered 2018-07-21: 7.5 mg via ORAL
  Filled 2018-07-21: qty 1

## 2018-07-21 MED ORDER — CALCIUM CARBONATE-VITAMIN D 500-200 MG-UNIT PO TABS
2.0000 | ORAL_TABLET | Freq: Every day | ORAL | Status: DC
  Administered 2018-07-21 – 2018-07-24 (×4): 2 via ORAL
  Filled 2018-07-21 (×4): qty 2

## 2018-07-21 MED ORDER — PROMETHAZINE HCL 25 MG PO TABS: 25.0000 mg | ORAL_TABLET | Freq: Four times a day (QID) | ORAL | Status: DC | PRN

## 2018-07-21 NOTE — ED Triage Notes (Signed)
Patient arrives via [facility] from home, ems reports that pts family stated pt has had some ongoing weakness on her L side with difficulty holding and picking up items, pt got up at midnight tonight to go to the restroom and states she lost her balance and fell, striking the left side of her face on the tile floor. pts son reports that patient had increased weakness on the L side once he arrived to her home after the fall. Ems reports L arm and L leg drift, L facial droop and mild slurring of speech. Pt a/ox4, resp e/u, nad.

## 2018-07-21 NOTE — ED Provider Notes (Signed)
[facility] EMERGENCY DEPARTMENT Provider Note   CSN: [id number] Arrival date & time: 07/21/18  0351     History   Chief Complaint Chief Complaint  Patient presents with  . Code Stroke    HPI [name] is a 78 y.o. female.  78 yo F with a chief complaint of left-sided weakness.  This been going on since about Christmas time.  Patient had some limited coordination with her left hand and has been dropping things more often.  She went to the bathroom this evening and lost her balance and fell down.  At that time the left upper extremity weakness was worse than it had been previously.  She was made a code stroke in route.  Level 5 caveat acuity condition.  The history is provided by the patient.  Illness  Severity:  Severe Onset quality:  Sudden Duration:  1 hour Timing:  Constant Progression:  Worsening Chronicity:  New Associated symptoms: headaches   Associated symptoms: no chest pain, no congestion, no fever, no myalgias, no nausea, no rhinorrhea, no shortness of breath, no vomiting and no wheezing     Past Medical History:  Diagnosis Date  . Anemia 09/2017  . Arthritis    back.and feet  . Cervical ca ([place]) dx'd 1988   surg only  . CKD (chronic kidney disease), stage III ([place])   . Complication of anesthesia    difficulty awakening, dysrhythmia post surgery, (prolonged sedation level)  . Coronary artery calcification seen on CT scan   . Dyspnea   . GERD (gastroesophageal reflux disease)   . Headache    occ. sinus type headaches  . History of hiatal hernia   . Hx of radiation therapy 09/16/13-09/30/13   prevascular lymph node  . Hyperlipidemia   . Hypertension   . Hyperthyroidism    dr Loanne Drilling- radioactive iodine treatment 2011- resolved  . Hypothyroidism   . LBBB (left bundle branch block)   . Lung cancer ([place])    Bilaterally, RUL lobectomy and spot involving both lungs- "cancer"  . Macular degeneration    bilateral  . Melanoma ([place])    left leg  . Radiation 10/11/11-10/21/11   Left upper lobe adenocarcinoma  . Tremor    hand- intention tremor    Patient Active Problem List   Diagnosis Date Noted  . Dyslipidemia   . Malignant neoplasm of right lung ([place])   . Leukocytosis   . Brain metastasis ([place]) 07/21/2018  . Left hemiparesis ([place]) 07/21/2018  . 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 Acute upper GI bleed   . Anemia due to acute blood loss   . Closed flail chest   . CAD (coronary artery disease) 09/21/2017  . Coronary stent thrombosis   . Endotracheal tube present   . CAD in native artery 09/20/2017  . Elevated serum creatinine 04/15/2016  . Acute kidney injury superimposed on chronic kidney disease ([place]) 03/24/2016  . Coronary artery calcification seen on CT scan   . CKD (chronic kidney disease), stage III ([place])   . Hypertension   . Hypothyroidism 03/15/2016  . Hyperlipidemia 08/19/2015  . Cancer of upper lobe of left lung ([place])   . Recurrent lung adenocarcinoma ([place]) 10/01/2014  . Local recurrence of left lung cancer ([place])   . Carotid artery disease without cerebral infarction ([place]) 05/20/2013  . Atherosclerosis of aorta ([place]) 05/10/2013  . Macular degeneration 05/10/2013  . Allergic rhinitis 10/20/2012  . Cancer of upper lobe of lung ([place])  09/28/2011  . Melanoma ([place])   . LBBB (left bundle branch block)   . Gastritis due to nonsteroidal anti-inflammatory drug 06/20/2011  . Hyperthyroidism   . Tremor   . Abdominal pain, RUQ 02/10/2011  . GERD with stricture 02/08/2011  . GOITER, MULTINODULAR 09/25/2009  . URI 09/02/2009  . Essential hypertension, benign 12/01/2008    Past Surgical History:  Procedure Laterality Date  . basal cell and squamous cell skin cell area removed     new left lower eye lid done with skin graft   . CARDIAC CATHETERIZATION  09/21/2017  . CATARACT EXTRACTION, BILATERAL Bilateral   . COLONOSCOPY WITH PROPOFOL N/A 10/27/2016   Procedure: COLONOSCOPY WITH  PROPOFOL;  Surgeon: Laurence Spates, MD;  Location: [facility];  Service: Endoscopy;  Laterality: N/A;  . conization for cervical dysplasia    . CORONARY STENT INTERVENTION N/A 09/21/2017   Procedure: CORONARY STENT INTERVENTION;  Surgeon: Belva Crome, MD;  Location: [facility];  Service: Cardiovascular;  Laterality: N/A;  . CORONARY/GRAFT ACUTE MI REVASCULARIZATION N/A 09/21/2017   Procedure: Coronary/Graft Acute MI Revascularization;  Surgeon: Belva Crome, MD;  Location: [facility];  Service: Cardiovascular;  Laterality: N/A;  . ESOPHAGOGASTRODUODENOSCOPY (EGD) WITH PROPOFOL N/A 09/28/2017   Procedure: ESOPHAGOGASTRODUODENOSCOPY (EGD) WITH PROPOFOL;  Surgeon: Arta Silence, MD;  Location: [facility];  Service: Endoscopy;  Laterality: N/A;  . ESOPHAGOGASTRODUODENOSCOPY (EGD) WITH PROPOFOL N/A 09/30/2017   Procedure: ESOPHAGOGASTRODUODENOSCOPY (EGD) WITH PROPOFOL;  Surgeon: Otis Brace, MD;  Location: [facility];  Service: Gastroenterology;  Laterality: N/A;  . esophogus stretching    . GIVENS CAPSULE STUDY N/A 10/06/2017   Procedure: GIVENS CAPSULE STUDY;  Surgeon: Arta Silence, MD;  Location: [facility];  Service: Endoscopy;  Laterality: N/A;  . INTRAVASCULAR ULTRASOUND/IVUS N/A 09/21/2017   Procedure: Intravascular Ultrasound/IVUS;  Surgeon: Belva Crome, MD;  Location: [facility];  Service: Cardiovascular;  Laterality: N/A;  . IR GENERIC HISTORICAL  09/11/2014   IR RADIOLOGIST EVAL & MGMT 09/11/2014 Jacqulynn Cadet, MD [facility] INTERV RAD  . LEFT HEART CATH AND CORONARY ANGIOGRAPHY N/A 09/21/2017   Procedure: LEFT HEART CATH AND CORONARY ANGIOGRAPHY;  Surgeon: Belva Crome, MD;  Location: [facility];  Service: Cardiovascular;  Laterality: N/A;  . LEFT HEART CATH AND CORONARY ANGIOGRAPHY N/A 09/21/2017   Procedure: LEFT HEART CATH AND CORONARY ANGIOGRAPHY;  Surgeon: Belva Crome, MD;  Location: [facility];  Service: Cardiovascular;   Laterality: N/A;  . radio frequency ablation on lung spot    . rul lobectomy  2010  . TONSILLECTOMY    . TONSILLECTOMY       OB History   No obstetric history on file.      Home Medications    Prior to Admission medications   Medication Sig Start Date End Date Taking? Authorizing Provider  acetaminophen (TYLENOL) 650 MG CR tablet Take 1,300 mg by mouth 2 (two) times daily.    Yes [provider]  amLODipine (NORVASC) 10 MG tablet Take 0.5 tablets (5 mg total) by mouth daily. 06/28/18  Yes Dorothy Spark, MD  atorvastatin (LIPITOR) 80 MG tablet Take 1 tablet (80 mg total) by mouth daily. 11/24/17 07/21/18 Yes Bhagat, Bhavinkumar, PA  Calcium Carbonate-Vitamin D3 (CALCIUM 600-D) 600-400 MG-UNIT TABS Take 1 tablet by mouth every morning.    Yes [provider]  carvedilol (COREG) 25 MG tablet Take 1 tablet (25 mg total) by mouth 2 (two) times daily. 06/28/18 06/28/19 Yes  Dorothy Spark, MD  DM-APAP-CPM (CORICIDIN HBP FLU PO) Take 2 tablets by mouth every 6 (six) hours as needed (flu symptoms).   Yes [provider]  etodolac (LODINE) 400 MG tablet Take 400 mg by mouth 2 (two) times daily. 06/05/18  Yes [provider]  fluticasone (FLONASE) 50 MCG/ACT nasal spray Place 1 spray into both nostrils daily as needed for allergies.    Yes [provider]  gabapentin (NEURONTIN) 300 MG capsule Take 600 mg by mouth at bedtime.  07/31/14  Yes [provider]  ketotifen (ZADITOR) 0.025 % ophthalmic solution Place 1 drop into the right eye 2 (two) times daily as needed (irritation).   Yes [provider]  levothyroxine (SYNTHROID, LEVOTHROID) 88 MCG tablet Take 88 mcg by mouth daily before breakfast.  01/08/18  Yes [provider]  losartan (COZAAR) 50 MG tablet Take 1 tablet (50 mg total) by mouth 2 (two) times daily. 10/26/17  Yes Bhagat, Bhavinkumar, PA  Multiple Vitamin (MULTIVITAMIN WITH MINERALS) TABS tablet Take 1 tablet  by mouth daily.   Yes [provider]  Multiple Vitamins-Minerals (PRESERVISION AREDS 2 PO) Take 1 capsule by mouth at bedtime.    Yes [provider]  nitroGLYCERIN (NITROSTAT) 0.4 MG SL tablet Place 1 tablet (0.4 mg total) under the tongue every 5 (five) minutes as needed for chest pain. 10/17/17  Yes Cheryln Manly, NP  ondansetron (ZOFRAN ODT) 4 MG disintegrating tablet Take 1 tablet (4 mg total) by mouth every 8 (eight) hours as needed for nausea or vomiting. 04/07/18  Yes Lorin Glass, PA-C  pantoprazole (PROTONIX) 40 MG tablet Take 1 tablet (40 mg total) by mouth 2 (two) times daily. 10/17/17  Yes Cheryln Manly, NP  Polyethyl Glycol-Propyl Glycol (SYSTANE) 0.4-0.3 % SOLN Apply 1 drop to eye daily as needed.   Yes [provider]  promethazine (PHENERGAN) 25 MG tablet Take 25 mg by mouth every 6 (six) hours as needed for nausea or vomiting.   Yes [provider]    Family History Family History  Problem Relation Age of Onset  . Asthma Mother   . Heart disease Mother   . Thyroid disease Daughter        hypothyroidism  . Cancer Maternal Uncle        lung  . Cancer Maternal Grandfather        lung  . Diabetes Neg Hx   . Coronary artery disease Neg Hx     Social History Social History   Tobacco Use  . Smoking status: Former Smoker    Packs/day: 1.00    Years: 17.00    Pack years: 17.00    Types: Cigarettes    Last attempt to quit: 09/28/1975    Years since quitting: 42.8  . Smokeless tobacco: Never Used  . Tobacco comment: 33 yrs ago  Substance Use Topics  . Alcohol use: Yes    Comment: glass wine daily  . Drug use: No     Allergies   Meperidine hcl; Morphine; Penicillins; Oxycodone-acetaminophen; Propoxyphene n-acetaminophen; and Tramadol hcl   Review of Systems Review of Systems  Constitutional: Negative for chills and fever.  HENT: Negative for congestion and rhinorrhea.   Eyes: Negative for redness and visual  disturbance.  Respiratory: Negative for shortness of breath and wheezing.   Cardiovascular: Negative for chest pain and palpitations.  Gastrointestinal: Negative for nausea and vomiting.  Genitourinary: Negative for dysuria and urgency.  Musculoskeletal: Negative for arthralgias and  myalgias.  Skin: Negative for pallor and wound.  Neurological: Positive for weakness and headaches. Negative for dizziness.     Physical Exam Updated Vital Signs BP (!) 167/74 (BP Location: Left Arm)   Pulse 75   Temp 97.6 F (36.4 C) (Oral)   Resp 20   Ht 5\' 3"  (1.6 m)   Wt 58.1 kg   SpO2 97%   BMI 22.69 kg/m   Physical Exam Vitals signs and nursing note reviewed.  Constitutional:      General: She is not in acute distress.    Appearance: She is well-developed. She is not diaphoretic.  HENT:     Head: Normocephalic and atraumatic.  Eyes:     Pupils: Pupils are equal, round, and reactive to light.  Neck:     Musculoskeletal: Normal range of motion and neck supple.  Cardiovascular:     Rate and Rhythm: Normal rate and regular rhythm.     Heart sounds: No murmur. No friction rub. No gallop.   Pulmonary:     Effort: Pulmonary effort is normal.     Breath sounds: No wheezing or rales.  Abdominal:     General: There is no distension.     Palpations: Abdomen is soft.     Tenderness: There is no abdominal tenderness.  Musculoskeletal:        General: No tenderness.  Skin:    General: Skin is warm and dry.     Comments: Left upper extremity weakness compared to right  Neurological:     Mental Status: She is alert and oriented to person, place, and time.  Psychiatric:        Behavior: Behavior normal.      ED Treatments / Results  Labs (all labs ordered are listed, but only abnormal results are displayed) Labs Reviewed  CBC - Abnormal; Notable for the following components:      Result Value   WBC 12.7 (*)    All other components within normal limits  DIFFERENTIAL - Abnormal;  Notable for the following components:   Neutro Abs 10.0 (*)    Monocytes Absolute 1.2 (*)    All other components within normal limits  COMPREHENSIVE METABOLIC PANEL - Abnormal; Notable for the following components:   Glucose, Bld 130 (*)    Creatinine, Ser 1.05 (*)    Calcium 10.7 (*)    Total Protein 5.9 (*)    GFR calc non Af Amer 51 (*)    GFR calc Af Amer 59 (*)    All other components within normal limits  RAPID URINE DRUG SCREEN, HOSP PERFORMED - Abnormal; Notable for the following components:   Opiates POSITIVE (*)    All other components within normal limits  URINALYSIS, ROUTINE W REFLEX MICROSCOPIC - Abnormal; Notable for the following components:   Color, Urine STRAW (*)    All other components within normal limits  COMPREHENSIVE METABOLIC PANEL - Abnormal; Notable for the following components:   Glucose, Bld 132 (*)    BUN 26 (*)    Creatinine, Ser 1.03 (*)    Calcium 8.8 (*)    Total Protein 5.2 (*)    Albumin 3.0 (*)    GFR calc non Af Amer 52 (*)    All other components within normal limits  I-STAT CHEM 8, ED - Abnormal; Notable for the following components:   Glucose, Bld 128 (*)    All other components within normal limits  ETHANOL  PROTIME-INR  APTT  I-STAT TROPONIN, ED  EKG EKG Interpretation  Date/Time:  Saturday July 21 2018 04:11:48 EST Ventricular Rate:  82 PR Interval:    QRS Duration: 154 QT Interval:  404 QTC Calculation: 472 R Axis:   -22 Text Interpretation:  Sinus rhythm Left bundle branch block No significant change was found Confirmed by Shanon Rosser [phone]) on 07/22/2018 1:24:50 PM   Radiology No results found.  Procedures Procedures (including critical care time)  Medications Ordered in ED Medications  dexamethasone (DECADRON) tablet 4 mg (4 mg Oral Given 07/23/18 0535)  acetaminophen (TYLENOL) tablet 1,000 mg (1,000 mg Oral Refused 07/21/18 0502)  morphine (MSIR) tablet 7.5 mg (7.5 mg Oral Given 07/21/18 0514)    acetaminophen (TYLENOL) tablet 650 mg (650 mg Oral Given 07/23/18 0535)  etodolac (LODINE) tablet 400 mg (400 mg Oral Given 07/22/18 2123)  amLODipine (NORVASC) tablet 5 mg (5 mg Oral Given 07/22/18 0959)  carvedilol (COREG) tablet 25 mg (25 mg Oral Given 07/22/18 2122)  losartan (COZAAR) tablet 50 mg (50 mg Oral Given 07/22/18 2124)  nitroGLYCERIN (NITROSTAT) SL tablet 0.4 mg (has no administration in time range)  levothyroxine (SYNTHROID, LEVOTHROID) tablet 88 mcg (88 mcg Oral Given 07/22/18 0501)  ondansetron (ZOFRAN-ODT) disintegrating tablet 4 mg (has no administration in time range)  pantoprazole (PROTONIX) EC tablet 40 mg (40 mg Oral Given 07/22/18 2124)  gabapentin (NEURONTIN) capsule 600 mg (600 mg Oral Given 07/22/18 2122)  calcium-vitamin D (OSCAL WITH D) 500-200 MG-UNIT per tablet 2 tablet (2 tablets Oral Given 07/23/18 0915)  multivitamin with minerals tablet 1 tablet (1 tablet Oral Given 07/22/18 0958)  promethazine (PHENERGAN) tablet 25 mg (has no administration in time range)  ketotifen (ZADITOR) 0.025 % ophthalmic solution 1 drop (has no administration in time range)  atorvastatin (LIPITOR) tablet 80 mg (80 mg Oral Given 07/22/18 2123)  0.9 %  sodium chloride infusion ( Intravenous New Bag/Given 07/23/18 0014)  dexamethasone (DECADRON) injection 10 mg (10 mg Intravenous Given 07/21/18 0504)  gadobutrol (GADAVIST) 1 MMOL/ML injection 5 mL (5 mLs Intravenous Contrast Given 07/21/18 1036)     Initial Impression / Assessment and Plan / ED Course  I have reviewed the triage vital signs and the nursing notes.  Pertinent labs & imaging results that were available during my care of the patient were reviewed by me and considered in my medical decision making (see chart for details).     78 yo F with a chief complaint of a fall and then worsening left-sided weakness.  Patient was made a code stroke in route.  She was found to have new metastatic disease.  Seen by neurology and recommended  starting on steroids and having physical therapy and hospitalist admission.  CRITICAL CARE Performed by: Cecilio Asper   Total critical care time: 35 minutes  Critical care time was exclusive of separately billable procedures and treating other patients.  Critical care was necessary to treat or prevent imminent or life-threatening deterioration.  Critical care was time spent personally by me on the following activities: development of treatment plan with patient and/or surrogate as well as nursing, discussions with consultants, evaluation of patient's response to treatment, examination of patient, obtaining history from patient or surrogate, ordering and performing treatments and interventions, ordering and review of laboratory studies, ordering and review of radiographic studies, pulse oximetry and re-evaluation of patient's condition.  The patients results and plan were reviewed and discussed.   Any x-rays performed were independently reviewed by myself.   Differential diagnosis were considered with the presenting HPI.  Medications  dexamethasone (DECADRON) tablet 4 mg (4 mg Oral Given 07/23/18 0535)  acetaminophen (TYLENOL) tablet 1,000 mg (1,000 mg Oral Refused 07/21/18 0502)  morphine (MSIR) tablet 7.5 mg (7.5 mg Oral Given 07/21/18 0514)  acetaminophen (TYLENOL) tablet 650 mg (650 mg Oral Given 07/23/18 0535)  etodolac (LODINE) tablet 400 mg (400 mg Oral Given 07/22/18 2123)  amLODipine (NORVASC) tablet 5 mg (5 mg Oral Given 07/22/18 0959)  carvedilol (COREG) tablet 25 mg (25 mg Oral Given 07/22/18 2122)  losartan (COZAAR) tablet 50 mg (50 mg Oral Given 07/22/18 2124)  nitroGLYCERIN (NITROSTAT) SL tablet 0.4 mg (has no administration in time range)  levothyroxine (SYNTHROID, LEVOTHROID) tablet 88 mcg (88 mcg Oral Given 07/22/18 0501)  ondansetron (ZOFRAN-ODT) disintegrating tablet 4 mg (has no administration in time range)  pantoprazole (PROTONIX) EC tablet 40 mg (40 mg Oral Given  07/22/18 2124)  gabapentin (NEURONTIN) capsule 600 mg (600 mg Oral Given 07/22/18 2122)  calcium-vitamin D (OSCAL WITH D) 500-200 MG-UNIT per tablet 2 tablet (2 tablets Oral Given 07/23/18 0915)  multivitamin with minerals tablet 1 tablet (1 tablet Oral Given 07/22/18 0958)  promethazine (PHENERGAN) tablet 25 mg (has no administration in time range)  ketotifen (ZADITOR) 0.025 % ophthalmic solution 1 drop (has no administration in time range)  atorvastatin (LIPITOR) tablet 80 mg (80 mg Oral Given 07/22/18 2123)  0.9 %  sodium chloride infusion ( Intravenous New Bag/Given 07/23/18 0014)  dexamethasone (DECADRON) injection 10 mg (10 mg Intravenous Given 07/21/18 0504)  gadobutrol (GADAVIST) 1 MMOL/ML injection 5 mL (5 mLs Intravenous Contrast Given 07/21/18 1036)    Vitals:   07/22/18 2028 07/23/18 0000 07/23/18 0340 07/23/18 0742  BP: (!) 144/66 (!) 153/71 (!) 160/84 (!) 167/74  Pulse: 97 85 96 75  Resp: 18 18 20 20   Temp: 97.7 F (36.5 C) 98 F (36.7 C) 97.7 F (36.5 C) 97.6 F (36.4 C)  TempSrc: Oral Oral Oral Oral  SpO2: 96% 97% 94% 97%  Weight:      Height:        Final diagnoses:  Brain metastases (HCC)    Admission/ observation were discussed with the admitting physician, patient and/or family and they are comfortable with the plan.   Final Clinical Impressions(s) / ED Diagnoses   Final diagnoses:  Brain metastases [facility])    ED Discharge Orders    None       Deno Etienne, DO 07/23/18 1052

## 2018-07-21 NOTE — Consult Note (Signed)
[place]  [phone] [phone]     ID: [name] DOB: Jun 04, 1941  MR#: [id number]  ZHY#:[id number]  Patient Care Team: Wenda Low, MD as PCP - General (Internal Medicine) Dorothy Spark, MD as PCP - Cardiology (Cardiology) Nicanor Alcon, MD (Thoracic Surgery) Curt Bears, MD (Hematology and Oncology) Renato Shin, MD (Internal Medicine) Chauncey Cruel, MD OTHER MD:  CHIEF COMPLAINT: new brain metastases  CURRENT TREATMENT: steroids; addition workup pending   HISTORY OF CURRENT ILLNESS: [name] tells me she had a melanoma resected in 2010--I cannot locate the pathology from that lesion. She also had a diagnosis of stage I NSCLC s/p RULobectomy in 2009, with LUL recurrence 2013 and in a prevascular node March 2015. These recurrences were treared with stereotactic radiotherapy and she also received erlotinib briefly, with poor tolerance. She has had additional recurrences treated with radiation, most recently to a LUL lesion in AUG 2019. CT of the chest 06/18/2018 showed improvement in the treated mass and slow growth of a separate LUL mass with repeat CT in 3-4 months suggested.  Since 07/02/2018 she has noted some balance issues, no visual changes, N/V, or h/a. She started noticing some L hand weakness about the same time. Around midnight (last night) she fell on the way to the [place] and hit her head. Family brought her to the ED where non contrast head CT showed no bleed but multiple brain lesions. MRI today showed at least 8 brain metastases. She was evaluated by neurology and started on steroids. We were consulted re further management  The patient's subsequent history is as detailed below.  INTERVAL HISTORY: I met with the patient in her hospital room. No family present   REVIEW OF SYSTEMS: Still c/o weakness in her Left UE ("the hand does what it wants") but no other focal sensory or motor deficits. Denies h/a, visual  changes, N/V. No Hx seizures and no LOC with recent fall. A detailed ROS today was otherwise non-contributory.  PAST MEDICAL HISTORY: Past Medical History:  Diagnosis Date  . Anemia 09/2017  . Arthritis    back.and feet  . Cervical ca ([place]) dx'd 1988   surg only  . CKD (chronic kidney disease), stage III ([place])   . Complication of anesthesia    difficulty awakening, dysrhythmia post surgery, (prolonged sedation level)  . Coronary artery calcification seen on CT scan   . Dyspnea   . GERD (gastroesophageal reflux disease)   . Headache    occ. sinus type headaches  . History of hiatal hernia   . Hx of radiation therapy 09/16/13-09/30/13   prevascular lymph node  . Hyperlipidemia   . Hypertension   . Hyperthyroidism    dr Loanne Drilling- radioactive iodine treatment 2011- resolved  . Hypothyroidism   . LBBB (left bundle branch block)   . Lung cancer ([place])    Bilaterally, RUL lobectomy and spot involving both lungs- "cancer"  . Macular degeneration    bilateral  . Melanoma ([place])    left leg  . Radiation 10/11/11-10/21/11   Left upper lobe adenocarcinoma  . Tremor    hand- intention tremor    PAST SURGICAL HISTORY: Past Surgical History:  Procedure Laterality Date  . basal cell and squamous cell skin cell area removed     new left lower eye lid done with skin graft   . CARDIAC CATHETERIZATION  09/21/2017  . CATARACT EXTRACTION, BILATERAL Bilateral   . COLONOSCOPY WITH PROPOFOL N/A 10/27/2016   Procedure:  COLONOSCOPY WITH PROPOFOL;  Surgeon: Laurence Spates, MD;  Location: [facility];  Service: Endoscopy;  Laterality: N/A;  . conization for cervical dysplasia    . CORONARY STENT INTERVENTION N/A 09/21/2017   Procedure: CORONARY STENT INTERVENTION;  Surgeon: Belva Crome, MD;  Location: [facility];  Service: Cardiovascular;  Laterality: N/A;  . CORONARY/GRAFT ACUTE MI REVASCULARIZATION N/A 09/21/2017   Procedure: Coronary/Graft Acute MI Revascularization;  Surgeon: Belva Crome, MD;  Location: [facility];  Service: Cardiovascular;  Laterality: N/A;  . ESOPHAGOGASTRODUODENOSCOPY (EGD) WITH PROPOFOL N/A 09/28/2017   Procedure: ESOPHAGOGASTRODUODENOSCOPY (EGD) WITH PROPOFOL;  Surgeon: Arta Silence, MD;  Location: [facility];  Service: Endoscopy;  Laterality: N/A;  . ESOPHAGOGASTRODUODENOSCOPY (EGD) WITH PROPOFOL N/A 09/30/2017   Procedure: ESOPHAGOGASTRODUODENOSCOPY (EGD) WITH PROPOFOL;  Surgeon: Otis Brace, MD;  Location: [facility];  Service: Gastroenterology;  Laterality: N/A;  . esophogus stretching    . GIVENS CAPSULE STUDY N/A 10/06/2017   Procedure: GIVENS CAPSULE STUDY;  Surgeon: Arta Silence, MD;  Location: [facility] ENDOSCOPY;  Service: Endoscopy;  Laterality: N/A;  . INTRAVASCULAR ULTRASOUND/IVUS N/A 09/21/2017   Procedure: Intravascular Ultrasound/IVUS;  Surgeon: Belva Crome, MD;  Location: [facility] LAB;  Service: Cardiovascular;  Laterality: N/A;  . IR GENERIC HISTORICAL  09/11/2014   IR RADIOLOGIST EVAL & MGMT 09/11/2014 Jacqulynn Cadet, MD [facility] INTERV RAD  . LEFT HEART CATH AND CORONARY ANGIOGRAPHY N/A 09/21/2017   Procedure: LEFT HEART CATH AND CORONARY ANGIOGRAPHY;  Surgeon: Belva Crome, MD;  Location: [facility];  Service: Cardiovascular;  Laterality: N/A;  . LEFT HEART CATH AND CORONARY ANGIOGRAPHY N/A 09/21/2017   Procedure: LEFT HEART CATH AND CORONARY ANGIOGRAPHY;  Surgeon: Belva Crome, MD;  Location: [facility];  Service: Cardiovascular;  Laterality: N/A;  . radio frequency ablation on lung spot    . rul lobectomy  2010  . TONSILLECTOMY    . TONSILLECTOMY      FAMILY HISTORY Family History  Problem Relation Age of Onset  . Asthma Mother   . Heart disease Mother   . Thyroid disease Daughter        hypothyroidism  . Cancer Maternal Uncle        lung  . Cancer Maternal Grandfather        lung  . Diabetes Neg Hx   . 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 Widowed, her husband died 14-Jan-2018 from renal cell CA metastatic to the brain--Dr Sherwood Gambler was his Psychologist, sport and exercise. The patient has 2 children [name] and [place], both living in [place]. She has 6 grandchildren.     ADVANCED DIRECTIVES: in place; out of facility DNR order in room; son Gwynneth Macleod is HCPOA   HEALTH MAINTENANCE: Social History   Tobacco Use  . Smoking status: Former Smoker    Packs/day: 1.00    Years: 17.00    Pack years: 17.00    Types: Cigarettes    Last attempt to quit: 09/28/1975    Years since quitting: 42.8  . Smokeless tobacco: Never Used  . Tobacco comment: 33 yrs ago  Substance Use Topics  . Alcohol use: Yes    Comment: glass wine daily  . Drug use: No       Allergies  Allergen Reactions  . Meperidine Hcl Other (See Comments)    "knocked pt out for 4 days"  . Morphine Other (See Comments)  Stopped breathing due to too high of a dose per patient.  Not an allergic reaction to morphine  . Penicillins Anaphylaxis, Shortness Of Breath and Other (See Comments)    Difficulty breathing  . Oxycodone-Acetaminophen Itching       . Propoxyphene N-Acetaminophen Nausea And Vomiting  . Tramadol Hcl Nausea And Vomiting    Current Facility-Administered Medications  Medication Dose Route Frequency Provider Last Rate Last Dose  . acetaminophen (TYLENOL) tablet 1,000 mg  1,000 mg Oral Once Barb Merino, MD      . acetaminophen (TYLENOL) tablet 650 mg  650 mg Oral Q6H Ghimire, Kuber, MD      . amLODipine (NORVASC) tablet 5 mg  5 mg Oral Daily Barb Merino, MD   5 mg at 07/21/18 0912  . atorvastatin (LIPITOR) tablet 80 mg  80 mg Oral Q2200 Barb Merino, MD      . calcium-vitamin D (OSCAL WITH D) 500-200 MG-UNIT per tablet 2 tablet  2 tablet Oral Q breakfast Barb Merino, MD   2 tablet at 07/21/18 0912  . carvedilol (COREG) tablet 25 mg  25 mg Oral BID Barb Merino, MD   25 mg at 07/21/18 0912  . dexamethasone (DECADRON) tablet 4 mg  4 mg Oral Q6H  Ghimire, Dante Gang, MD      . etodolac (LODINE) tablet 400 mg  400 mg Oral BID Barb Merino, MD      . gabapentin (NEURONTIN) capsule 600 mg  600 mg Oral QHS Ghimire, Kuber, MD      . ketotifen (ZADITOR) 0.025 % ophthalmic solution 1 drop  1 drop Right Eye BID PRN Barb Merino, MD      . levothyroxine (SYNTHROID, LEVOTHROID) tablet 88 mcg  88 mcg Oral QAC breakfast Barb Merino, MD   88 mcg at 07/21/18 0912  . losartan (COZAAR) tablet 50 mg  50 mg Oral BID Barb Merino, MD   50 mg at 07/21/18 0912  . morphine (MSIR) tablet 7.5 mg  7.5 mg Oral Q4H PRN Barb Merino, MD   7.5 mg at 07/21/18 0514  . multivitamin with minerals tablet 1 tablet  1 tablet Oral Daily Barb Merino, MD   1 tablet at 07/21/18 0912  . nitroGLYCERIN (NITROSTAT) SL tablet 0.4 mg  0.4 mg Sublingual Q5 Min x 3 PRN Barb Merino, MD      . ondansetron (ZOFRAN-ODT) disintegrating tablet 4 mg  4 mg Oral Q8H PRN Barb Merino, MD      . pantoprazole (PROTONIX) EC tablet 40 mg  40 mg Oral BID Barb Merino, MD   40 mg at 07/21/18 0912  . promethazine (PHENERGAN) tablet 25 mg  25 mg Oral Q6H PRN Barb Merino, MD        OBJECTIVE: middle aged White woman examined in bed  Vitals:   07/21/18 0700 07/21/18 0900  BP: (!) 127/94 (!) 172/81  Pulse: 89 92  Resp: 16 16  Temp:  98.3 F (36.8 C)  SpO2: 95% 97%     Body mass index is 22.14 kg/m.   Wt Readings from Last 3 Encounters:  07/21/18 125 lb (56.7 kg)  04/05/18 126 lb (57.2 kg)  03/19/18 125 lb 9.6 oz (57 kg)    Ocular: ecchymoses left periorbital area Lymphatic: No cervical or supraclavicular adenopathy Lungs no rales or rhonchi, auscultated anterolaterally Heart regular rate and rhythm Abd soft, nontender, positive bowel sounds Neuro: moves LUE and has a L grip, 3/5, well-oriented, appropriate affect Breasts: deferred   LAB RESULTS:  CMP  Component Value Date/Time   NA 139 07/21/2018 0405   NA 137 09/20/2017 0958   NA 141 05/29/2017 1136   K  3.8 07/21/2018 0405   K 4.5 05/29/2017 1136   CL 105 07/21/2018 0405   CL 105 07/17/2012 1434   CO2 26 07/21/2018 0358   CO2 28 05/29/2017 1136   GLUCOSE 128 (H) 07/21/2018 0405   GLUCOSE 89 05/29/2017 1136   GLUCOSE 133 (H) 07/17/2012 1434   BUN 20 07/21/2018 0405   BUN 25 09/20/2017 0958   BUN 26.1 (H) 05/29/2017 1136   CREATININE 1.00 07/21/2018 0405   CREATININE 1.05 (H) 06/18/2018 1014   CREATININE 1.3 (H) 05/29/2017 1136   CALCIUM 10.7 (H) 07/21/2018 0358   CALCIUM 10.4 05/29/2017 1136   PROT 5.9 (L) 07/21/2018 0358   PROT 5.7 (L) 01/01/2018 0933   PROT 6.4 05/29/2017 1136   ALBUMIN 3.6 07/21/2018 0358   ALBUMIN 4.1 01/01/2018 0933   ALBUMIN 3.5 05/29/2017 1136   AST 20 07/21/2018 0358   AST 15 05/29/2017 1136   ALT 21 07/21/2018 0358   ALT 12 05/29/2017 1136   ALKPHOS 61 07/21/2018 0358   ALKPHOS 59 05/29/2017 1136   BILITOT 0.8 07/21/2018 0358   BILITOT 0.4 01/01/2018 0933   BILITOT 0.51 05/29/2017 1136   GFRNONAA 51 (L) 07/21/2018 0358   GFRNONAA 51 (L) 06/18/2018 1014   GFRAA 59 (L) 07/21/2018 0358   GFRAA 59 (L) 06/18/2018 1014    No results found for: TOTALPROTELP, ALBUMINELP, A1GS, A2GS, BETS, BETA2SER, GAMS, MSPIKE, SPEI  No results found for: KPAFRELGTCHN, LAMBDASER, KAPLAMBRATIO  Lab Results  Component Value Date   WBC 12.7 (H) 07/21/2018   NEUTROABS 10.0 (H) 07/21/2018   HGB 13.3 07/21/2018   HCT 39.0 07/21/2018   MCV 92.1 07/21/2018   PLT 200 07/21/2018    '@LASTCHEMISTRY' @  No results found for: LABCA2  No components found for: [id number]  Recent Labs  Lab 07/21/18 0358  INR 1.03    No results found for: LABCA2  No results found for: CWC376  No results found for: EGB151  No results found for: VOH607  No results found for: CA2729  No components found for: HGQUANT  No results found for: CEA1 / No results found for: CEA1   No results found for: AFPTUMOR  No results found for: CHROMOGRNA  No results found for:  PSA1  Admission on 07/21/2018  Component Date Value Ref Range Status  . Sodium 07/21/2018 139  135 - 145 mmol/L Final  . Potassium 07/21/2018 3.8  3.5 - 5.1 mmol/L Final  . Chloride 07/21/2018 105  98 - 111 mmol/L Final  . BUN 07/21/2018 20  8 - 23 mg/dL Final  . Creatinine, Ser 07/21/2018 1.00  0.44 - 1.00 mg/dL Final  . Glucose, Bld 07/21/2018 128* 70 - 99 mg/dL Final  . Calcium, Ion 07/21/2018 1.27  1.15 - 1.40 mmol/L Final  . TCO2 07/21/2018 27  22 - 32 mmol/L Final  . Hemoglobin 07/21/2018 13.3  12.0 - 15.0 g/dL Final  . HCT 07/21/2018 39.0  36.0 - 46.0 % Final  . Alcohol, Ethyl (B) 07/21/2018 <10  <10 mg/dL Final   Comment: (NOTE) Lowest detectable limit for serum alcohol is 10 mg/dL. For medical purposes only. Performed at [facility], [place] [address] [place], [place] [address]   . Prothrombin Time 07/21/2018 13.4  11.4 - 15.2 seconds Final  . INR 07/21/2018 1.03   Final   Performed at [facility], [place] [address] [place], [place] [address]  . aPTT 07/21/2018 30  24 - 36 seconds Final   Performed at [facility], [place] [address] [place], [place] [address]  . WBC 07/21/2018 12.7* 4.0 - 10.5 K/uL Final  . RBC 07/21/2018 4.33  3.87 - 5.11 MIL/uL Final  . Hemoglobin 07/21/2018 12.6  12.0 - 15.0 g/dL Final  . HCT 07/21/2018 39.9  36.0 - 46.0 % Final  . MCV 07/21/2018 92.1  80.0 - 100.0 fL Final  . MCH 07/21/2018 29.1  26.0 - 34.0 pg Final  . MCHC 07/21/2018 31.6  30.0 - 36.0 g/dL Final  . RDW 07/21/2018 13.2  11.5 - 15.5 % Final  . Platelets 07/21/2018 200  150 - 400 K/uL Final  . nRBC 07/21/2018 0.0  0.0 - 0.2 % Final   Performed at [facility], [place] [address] [place], [place] [address]  . Neutrophils Relative % 07/21/2018 77  % Final  . Neutro Abs 07/21/2018 10.0* 1.7 - 7.7 K/uL Final  . Lymphocytes Relative 07/21/2018 8  % Final  . Lymphs Abs 07/21/2018 1.0  0.7 - 4.0 K/uL Final  . Monocytes Relative 07/21/2018 9  % Final  . Monocytes  Absolute 07/21/2018 1.2* 0.1 - 1.0 K/uL Final  . Eosinophils Relative 07/21/2018 4  % Final  . Eosinophils Absolute 07/21/2018 0.5  0.0 - 0.5 K/uL Final  . Basophils Relative 07/21/2018 1  % Final  . Basophils Absolute 07/21/2018 0.1  0.0 - 0.1 K/uL Final  . Immature Granulocytes 07/21/2018 1  % Final  . Abs Immature Granulocytes 07/21/2018 0.06  0.00 - 0.07 K/uL Final   Performed at [facility], [place] [address], [place], [place] [address]  . Sodium 07/21/2018 141  135 - 145 mmol/L Final  . Potassium 07/21/2018 3.7  3.5 - 5.1 mmol/L Final  . Chloride 07/21/2018 105  98 - 111 mmol/L Final  . CO2 07/21/2018 26  22 - 32 mmol/L Final  . Glucose, Bld 07/21/2018 130* 70 - 99 mg/dL Final  . BUN 07/21/2018 17  8 - 23 mg/dL Final  . Creatinine, Ser 07/21/2018 1.05* 0.44 - 1.00 mg/dL Final  . Calcium 07/21/2018 10.7* 8.9 - 10.3 mg/dL Final  . Total Protein 07/21/2018 5.9* 6.5 - 8.1 g/dL Final  . Albumin 07/21/2018 3.6  3.5 - 5.0 g/dL Final  . AST 07/21/2018 20  15 - 41 U/L Final  . ALT 07/21/2018 21  0 - 44 U/L Final  . Alkaline Phosphatase 07/21/2018 61  38 - 126 U/L Final  . Total Bilirubin 07/21/2018 0.8  0.3 - 1.2 mg/dL Final  . GFR calc non Af Amer 07/21/2018 51* >60 mL/min Final  . GFR calc Af Amer 07/21/2018 59* >60 mL/min Final  . Anion gap 07/21/2018 10  5 - 15 Final   Performed at [facility], [place] [address] [place], [place] [address]  . Troponin i, poc 07/21/2018 0.01  0.00 - 0.08 ng/mL Final  . Comment 3 07/21/2018          Final   Comment: Due to the release kinetics of cTnI, a negative result within the first hours of the onset of symptoms does not rule out myocardial infarction with certainty. If myocardial infarction is still suspected, repeat the test at appropriate intervals.   . Opiates 07/21/2018 POSITIVE* NONE DETECTED Final  . Cocaine 07/21/2018 NONE DETECTED  NONE DETECTED Final  . Benzodiazepines 07/21/2018 NONE DETECTED  NONE DETECTED Final  .  Amphetamines 07/21/2018 NONE DETECTED  NONE DETECTED Final  . Tetrahydrocannabinol 07/21/2018 NONE DETECTED  NONE DETECTED Final  . Barbiturates 07/21/2018 NONE DETECTED  NONE DETECTED Final   Comment: (NOTE) DRUG SCREEN FOR MEDICAL PURPOSES ONLY.  IF CONFIRMATION IS NEEDED FOR ANY PURPOSE, NOTIFY LAB WITHIN 5 DAYS. LOWEST DETECTABLE LIMITS FOR URINE DRUG SCREEN Drug Class                     Cutoff (ng/mL) Amphetamine and metabolites    1000 Barbiturate and metabolites    200 Benzodiazepine                 528 Tricyclics and metabolites     300 Opiates and metabolites        300 Cocaine and metabolites        300 THC                            50 Performed at [facility], [place] [address] [place], [place] [address]   . Color, Urine 07/21/2018 STRAW* YELLOW Final  . APPearance 07/21/2018 CLEAR  CLEAR Final  . Specific Gravity, Urine 07/21/2018 1.012  1.005 - 1.030 Final  . pH 07/21/2018 7.0  5.0 - 8.0 Final  . Glucose, UA 07/21/2018 NEGATIVE  NEGATIVE mg/dL Final  . Hgb urine dipstick 07/21/2018 NEGATIVE  NEGATIVE Final  . Bilirubin Urine 07/21/2018 NEGATIVE  NEGATIVE Final  . Ketones, ur 07/21/2018 NEGATIVE  NEGATIVE mg/dL Final  . Protein, ur 07/21/2018 NEGATIVE  NEGATIVE mg/dL Final  . Nitrite 07/21/2018 NEGATIVE  NEGATIVE Final  . Leukocytes, UA 07/21/2018 NEGATIVE  NEGATIVE Final   Performed at [facility], [place] [address] [place], [place] [address]    (this displays the last labs from the last 3 days)  No results found for: TOTALPROTELP, ALBUMINELP, A1GS, A2GS, BETS, BETA2SER, GAMS, MSPIKE, SPEI (this displays SPEP labs)  No results found for: KPAFRELGTCHN, LAMBDASER, KAPLAMBRATIO (kappa/lambda light chains)  No results found for: HGBA, HGBA2QUANT, HGBFQUANT, HGBSQUAN (Hemoglobinopathy evaluation)   Lab Results  Component Value Date   LDH 137 01/21/2010    Lab Results  Component Value Date   IRON 216 (H) 10/10/2017   TIBC 290 10/10/2017    IRONPCTSAT 75 (H) 10/10/2017   (Iron and TIBC)  Lab Results  Component Value Date   FERRITIN 44 10/10/2017    Urinalysis    Component Value Date/Time   COLORURINE STRAW (A) 07/21/2018 [place] 07/21/2018 0757   LABSPEC 1.012 07/21/2018 [place] 7.0 07/21/2018 [place] 07/21/2018 [place] 07/21/2018 [place] 07/21/2018 [place] 07/21/2018 0757   PROTEINUR NEGATIVE 07/21/2018 0757   UROBILINOGEN 0.2 08/19/2008 1026   NITRITE NEGATIVE 07/21/2018 0757   LEUKOCYTESUR NEGATIVE 07/21/2018 0757     STUDIES: Mr Jeri Cos VO Contrast  Result Date: 07/21/2018 CLINICAL DATA:  Non-small cell lung cancer. Metastatic disease. Progression. Abnormal CT head without contrast EXAM: MRI HEAD WITHOUT AND WITH CONTRAST TECHNIQUE: Multiplanar, multiecho pulse sequences of the brain and surrounding structures were obtained without and with intravenous contrast. CONTRAST:  5 mL Gadavist COMPARISON:  CT head without contrast 07/21/2018 FINDINGS: Brain: Multiple peripherally enhancing lesions are present with associated edema. Findings are consistent with metastatic disease to the brain. The lesion in the right parietal lobe measures 14  x 14 x 14 mm on image 14 of series 16. A posterior right parietal lesion measures 8 x 10 x 16 mm on image 40 of series 16. A medial anterior right frontal lobe lesion measures 21 x 17 x 22 mm on image 35 of series 16. A 3 mm right occipital lesion is best seen on the sagittal images. This is on image 7 of series 18. An anterior inferior left frontal lobe lesion measures 4 x 5 x 5 mm on image 26 of series 16. A left temporal lobe lesion measures 12 x 13 x 11 mm image 23 of series 16 A left anterior pontine lesion measures 8 x 8 x 9 mm on image 23 of series 16. A left cerebellar lesion measures 11 x 12 x 14 mm on image 21 of series 16. Restricted diffusion is present in the anterior right frontal  lobe and lateral right parietal lobe lesions. There is also some restricted diffusion associated with the left temporal lobe lesion. The right frontal and parietal lobe lesions demonstrate the greatest degree of surrounding edema. There is some edema surrounding each of the other lesions as well. No acute infarct is present. Periventricular and subcortical white matter changes are present in addition to the edema surrounding the metastases. Vascular: Flow is present in the major intracranial arteries. Skull and upper cervical spine: The craniocervical junction is normal. Upper cervical spine is within normal limits. Marrow signal is unremarkable. Sinuses/Orbits: The paranasal sinuses and mastoid air cells are clear. Bilateral lens replacements are noted. Globes and orbits are otherwise unremarkable. Other: 2 separate subcutaneous lesions are present lateral and superior to the left orbit. There is extensive surrounding edema. The lesions measure 13 x 15 and 14 x 14 mm respectively. IMPRESSION: 1. At least 8 parenchymal metastases as described above. 2. 2 left periorbital subcutaneous lesions with marked surrounding edema likely reflect subcutaneous metastases. 3. Mild atrophy and white matter disease otherwise likely reflects the sequela of chronic microvascular ischemia. Electronically Signed   By: San Morelle M.D.   On: 07/21/2018 11:18   Ct Head Code Stroke Wo Contrast  Result Date: 07/21/2018 CLINICAL DATA:  Code stroke. 78 y/o F; stroke with left-sided weakness. History of lung cancer. EXAM: CT HEAD WITHOUT CONTRAST TECHNIQUE: Contiguous axial images were obtained from the base of the skull through the vertex without intravenous contrast. COMPARISON:  09/18/2008 CT head. FINDINGS: Brain: At least 5 foci of mass effect with surrounding vasogenic edema in the right frontal lobe, right frontal parietal junction, right medial temporal lobe, left lateral temporal lobe, and the left cerebellar hemisphere  (series 3 image 10, 14, 16, 24) probably representing metastatic disease. No findings of hemorrhage, stroke, extra-axial collection, hydrocephalus, or herniation. Background of mild chronic microvascular ischemic changes and volume loss of the brain. Vascular: Calcific atherosclerosis of carotid siphons. No hyperdense vessel identified. Skull: Normal. Negative for fracture or focal lesion. Large left frontal and periorbital scalp contusion with foci of hemorrhage measuring up to 2 cm. Sinuses/Orbits: No acute finding. Other: Bilateral intra-ocular lens replacement. ASPECTS [facility] Stroke Program Early CT Score) - Ganglionic level infarction (caudate, lentiform nuclei, internal capsule, insula, M1-M3 cortex): 7 - Supraganglionic infarction (M4-M6 cortex): 3 Total score (0-10 with 10 being normal): 10 IMPRESSION: 1. At least 5 foci of mass effect with extensive surrounding edema throughout the brain, likely metastatic disease. MRI of the brain with and without contrast is recommended for further evaluation. 2. Large left frontal and periorbital scalp contusion. No calvarial  fracture or acute intracranial traumatic finding. 3. ASPECTS is not applicable. These results were called by telephone at the time of interpretation on 07/21/2018 at 4:10 am to Dr. Leonel Ramsay , who verbally acknowledged these results. Electronically Signed   By: Kristine Garbe M.D.   On: 07/21/2018 04:15    ASSESSMENT: 78 y.o. Day woman with a history of remote malignant melanoma (2010), recurrent non-small cell lung cancer treated in the past with erlotinib and radiation, now with gait imbalance, RUE weakness, and multiple brain lesions by CT and MRI, currently on steroids,   PLAN: I discussed the situation with [facility] (Dr Ronnald Ramp) as there is some question whether we should do a craniotomy for specific Dx given the prior history of melanoma. His feeling was that this will be treated with SRS in either case so for now no NSU  intervention needed.  I will alert Dr Julien Nordmann (oncol) and Dr Lisbeth Renshaw (Rad Onc) to the patient's admission. Further treatment will be as per their recommendations but note SRS is usually done on an outpatient basis. Because of her husband's history the patient is aware of the benefits and possible complications of SRS and is interested in treatment.  Please let me know if I can be of further help at tis point    Chauncey Cruel, MD   07/21/2018 11:50 AM Medical Oncology and Hematology [facility] [place], [place] [address] Tel. [phone]    Fax. [phone]

## 2018-07-21 NOTE — Consult Note (Signed)
Neurology Consultation Reason for Consult: Left-sided weakness Referring Physician: Tyrone Nine, D  CC: Left-sided weakness  History is obtained from: Patient  HPI: [name] is a 78 y.o. female with a history of lung cancer which was initially in her right lobe and returned recurrent on the left side.  She is seen by Dr. Earlie Server was offered palliative chemotherapy and radiation, and she has pursued the radiation but not the chemotherapy.  She has been having left hand weakness for the past few weeks, starting around Christmas time.  This has been progressive, and she had an appointment with her primary care physician on Monday to address this.  Unfortunately, tonight when she was getting out of bed, she fell.  She states that she just lost her balance, there was no loss of consciousness, no seizure, no loss time.  She did hit her head on the floor and then when she tried to get up she was unable to.  She called her son who came over and then 911 was activated.   LKW: Christmas tpa given?: no, not a stroke   ROS: A 14 point ROS was performed and is negative except as noted in the HPI.   Past Medical History:  Diagnosis Date  . Anemia 09/2017  . Arthritis    back.and feet  . Cervical ca ([place]) dx'd 1988   surg only  . CKD (chronic kidney disease), stage III ([place])   . Complication of anesthesia    difficulty awakening, dysrhythmia post surgery, (prolonged sedation level)  . Coronary artery calcification seen on CT scan   . Dyspnea   . GERD (gastroesophageal reflux disease)   . Headache    occ. sinus type headaches  . History of hiatal hernia   . Hx of radiation therapy 09/16/13-09/30/13   prevascular lymph node  . Hyperlipidemia   . Hypertension   . Hyperthyroidism    dr Loanne Drilling- radioactive iodine treatment 2011- resolved  . Hypothyroidism   . LBBB (left bundle branch block)   . Lung cancer ([place])    Bilaterally, RUL lobectomy and spot involving both lungs- "cancer"  . Macular  degeneration    bilateral  . Melanoma ([place])    left leg  . Radiation 10/11/11-10/21/11   Left upper lobe adenocarcinoma  . Tremor    hand- intention tremor     Family History  Problem Relation Age of Onset  . Asthma Mother   . Heart disease Mother   . Thyroid disease Daughter        hypothyroidism  . Cancer Maternal Uncle        lung  . Cancer Maternal Grandfather        lung  . Diabetes Neg Hx   . Coronary artery disease Neg Hx      Social History:  reports that she quit smoking about 42 years ago. Her smoking use included cigarettes. She has a 17.00 pack-year smoking history. She has never used smokeless tobacco. She reports current alcohol use. She reports that she does not use drugs.   Exam: Current vital signs: BP (!) 164/70   Pulse 82   Temp 97.7 F (36.5 C) (Oral)   Resp 15   Ht 5\' 3"  (1.6 m)   Wt 56.7 kg   SpO2 96%   BMI 22.14 kg/m  Vital signs in last 24 hours: Temp:  [97.7 F (36.5 C)] 97.7 F (36.5 C) (01/11 0410) Pulse Rate:  [82] 82 (01/11 0415) Resp:  [15-16] 15 (01/11 0415)  BP: (164-174)/(70-85) 164/70 (01/11 0415) SpO2:  [96 %] 96 % (01/11 0415) Weight:  [56.7 kg] 56.7 kg (01/11 0409)   Physical Exam  Constitutional: Appears well-developed and well-nourished.  Psych: Affect appropriate to situation Eyes: No scleral injection HENT: No OP obstrucion Head: Normocephalic.  Cardiovascular: Normal rate and regular rhythm.  Respiratory: Effort normal, non-labored breathing GI: Soft.  No distension. There is no tenderness.  Skin: WDI  Neuro: Mental Status: Patient is awake, alert, oriented to person, place, month, year, and situation. Patient is able to give a clear and coherent history. No signs of aphasia, she does have neglect Cranial Nerves: II: Visual Fields are full. Pupils are equal, round, and reactive to light.   III,IV, VI: EOMI without ptosis or diploplia.  V: Facial sensation is symmetric VII: Facial movement with left facial  weakness VIII: hearing is intact to voice X: Uvula elevates symmetrically XI: Shoulder shrug is symmetric. XII: tongue is midline without atrophy or fasciculations.  Motor: Tone is normal. Bulk is normal.  She has full strength in the right side, and the left arm she has distal >proximal weakness with very little movement at the wrist, but she is able to lift her arm and hold it with only mild drift, left leg is 4/5 Sensory: Sensation is diminished on the left side  Cerebellar: FNF and HKS are intact on the right   I have reviewed labs in epic and the results pertinent to this consultation are: CMP-elevated calcium  I have reviewed the images obtained: CT head-multifocal intracranial metastasis  Impression: 78 year old female who presents with left-sided weakness and is found to have metastatic cancer to the brain.  I suspect this represents her non-small cell lung cancer given the active disease process rather than melanoma.  She does have significant edema, and therefore may have some benefit from steroids.  She will need oncology involvement.  Recommendations: 1) she will need consultation with oncology 2) MRI brain with and without 3) Decadron 10 mg IV x1 followed by 4mg  q6h 4) neurology will follow  Roland Rack, MD [facility] [phone]  If 7pm- 7am, please page neurology on call as listed in [place].

## 2018-07-21 NOTE — H&P (Signed)
History and Physical    [name] OEH:[id number] DOB: 23-Apr-1941 DOA: 07/21/2018  PCP: Wenda Low, MD  Patient coming from: Home.  I have personally briefly reviewed patient's old medical records in Epic and Chart everywhere.   Chief Complaint: Fall at home, left arm weakness for more than 2 weeks.  HPI: [name] is a 78 y.o. female with medical history significant of non-small cell lung cancer with right upper lobe lobectomy in 2009, recurrence in 2013 that was treated with radiation.  No known history of brain metastasis.  Followed by oncology and under surveillance presents to the hospital with fall at home because of losing balance and left arm getting weaker and weaker.  According to the patient, it is started before Christmas that she will drop everything that she holds on her left hand.  She walks with a walker and recently has become difficulty getting around because of unreliable strength on the left hand.  Also has some weakness on the left leg and she has been noticing moving left leg well using the walker.  Last night, she was going to bathroom and she fell losing her support on walker and hit her left side of the face.  She called her son who brought her to the ED.  In the emergency room, stroke alert was called, however CT scan of the head showed metastatic lesions.  Patient was also evaluated by neurology.  Patient denies any fever or chills.  Denies any headache, nausea, vomiting.  Denies any syncopal episodes or dizziness.  Patient has fair appetite.  Denies any chest pain or shortness of breath.  Her left side of the face is hurting today after fall and took some Tylenol.  Denies any abdomen pain.  Her bowel habits are normal. ED Course: Hemodynamically stable.  Left hemiparesis.  Stroke alert called.  Seen by neurology.  Ruled out a stroke.  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  Received 80 mg of IV dexamethasone and then provided with oral  dexamethasone.  Review of Systems: As per HPI otherwise 10 point review of systems negative.    Past Medical History:  Diagnosis Date  . Anemia 09/2017  . Arthritis    back.and feet  . Cervical ca ([place]) dx'd 1988   surg only  . CKD (chronic kidney disease), stage III ([place])   . Complication of anesthesia    difficulty awakening, dysrhythmia post surgery, (prolonged sedation level)  . Coronary artery calcification seen on CT scan   . Dyspnea   . GERD (gastroesophageal reflux disease)   . Headache    occ. sinus type headaches  . History of hiatal hernia   . Hx of radiation therapy 09/16/13-09/30/13   prevascular lymph node  . Hyperlipidemia   . Hypertension   . Hyperthyroidism    dr Loanne Drilling- radioactive iodine treatment 2011- resolved  . Hypothyroidism   . LBBB (left bundle branch block)   . Lung cancer ([place])    Bilaterally, RUL lobectomy and spot involving both lungs- "cancer"  . Macular degeneration    bilateral  . Melanoma ([place])    left leg  . Radiation 10/11/11-10/21/11   Left upper lobe adenocarcinoma  . Tremor    hand- intention tremor    Past Surgical History:  Procedure Laterality Date  . basal cell and squamous cell skin cell area removed     new left lower eye lid done with skin graft   . CARDIAC CATHETERIZATION  09/21/2017  . CATARACT  EXTRACTION, BILATERAL Bilateral   . COLONOSCOPY WITH PROPOFOL N/A 10/27/2016   Procedure: COLONOSCOPY WITH PROPOFOL;  Surgeon: Laurence Spates, MD;  Location: [facility] ENDOSCOPY;  Service: Endoscopy;  Laterality: N/A;  . conization for cervical dysplasia    . CORONARY STENT INTERVENTION N/A 09/21/2017   Procedure: CORONARY STENT INTERVENTION;  Surgeon: Belva Crome, MD;  Location: [place];  Service: Cardiovascular;  Laterality: N/A;  . CORONARY/GRAFT ACUTE MI REVASCULARIZATION N/A 09/21/2017   Procedure: Coronary/Graft Acute MI Revascularization;  Surgeon: Belva Crome, MD;  Location: [facility];  Service:  Cardiovascular;  Laterality: N/A;  . ESOPHAGOGASTRODUODENOSCOPY (EGD) WITH PROPOFOL N/A 09/28/2017   Procedure: ESOPHAGOGASTRODUODENOSCOPY (EGD) WITH PROPOFOL;  Surgeon: Arta Silence, MD;  Location: [facility];  Service: Endoscopy;  Laterality: N/A;  . ESOPHAGOGASTRODUODENOSCOPY (EGD) WITH PROPOFOL N/A 09/30/2017   Procedure: ESOPHAGOGASTRODUODENOSCOPY (EGD) WITH PROPOFOL;  Surgeon: Otis Brace, MD;  Location: [facility];  Service: Gastroenterology;  Laterality: N/A;  . esophogus stretching    . GIVENS CAPSULE STUDY N/A 10/06/2017   Procedure: GIVENS CAPSULE STUDY;  Surgeon: Arta Silence, MD;  Location: [facility] ENDOSCOPY;  Service: Endoscopy;  Laterality: N/A;  . INTRAVASCULAR ULTRASOUND/IVUS N/A 09/21/2017   Procedure: Intravascular Ultrasound/IVUS;  Surgeon: Belva Crome, MD;  Location: [facility] [facility] CV LAB;  Service: Cardiovascular;  Laterality: N/A;  . IR GENERIC HISTORICAL  09/11/2014   IR RADIOLOGIST EVAL & MGMT 09/11/2014 Jacqulynn Cadet, MD [facility] INTERV RAD  . LEFT HEART CATH AND CORONARY ANGIOGRAPHY N/A 09/21/2017   Procedure: LEFT HEART CATH AND CORONARY ANGIOGRAPHY;  Surgeon: Belva Crome, MD;  Location: [place] [facility];  Service: Cardiovascular;  Laterality: N/A;  . LEFT HEART CATH AND CORONARY ANGIOGRAPHY N/A 09/21/2017   Procedure: LEFT HEART CATH AND CORONARY ANGIOGRAPHY;  Surgeon: Belva Crome, MD;  Location: [place] [facility];  Service: Cardiovascular;  Laterality: N/A;  . radio frequency ablation on lung spot    . rul lobectomy  2010  . TONSILLECTOMY    . TONSILLECTOMY       reports that she quit smoking about 42 years ago. Her smoking use included cigarettes. She has a 17.00 pack-year smoking history. She has never used smokeless tobacco. She reports current alcohol use. She reports that she does not use drugs.  Allergies  Allergen Reactions  . Meperidine Hcl Other (See Comments)    "knocked pt out for 4 days"  . Morphine Other (See Comments)    Stopped  breathing due to too high of a dose per patient.  Not an allergic reaction to morphine  . Penicillins Anaphylaxis, Shortness Of Breath and Other (See Comments)    Difficulty breathing  . Oxycodone-Acetaminophen Itching       . Propoxyphene N-Acetaminophen Nausea And Vomiting  . Tramadol Hcl Nausea And Vomiting    Family History  Problem Relation Age of Onset  . Asthma Mother   . Heart disease Mother   . Thyroid disease Daughter        hypothyroidism  . Cancer Maternal Uncle        lung  . Cancer Maternal Grandfather        lung  . Diabetes Neg Hx   . Coronary artery disease Neg Hx      Prior to Admission medications   Medication Sig Start Date End Date Taking? Authorizing Provider  acetaminophen (TYLENOL) 650 MG CR tablet Take 1,300 mg by mouth 2 (two) times daily.    Yes [provider]  amLODipine (  NORVASC) 10 MG tablet Take 0.5 tablets (5 mg total) by mouth daily. 06/28/18  Yes Dorothy Spark, MD  atorvastatin (LIPITOR) 80 MG tablet Take 1 tablet (80 mg total) by mouth daily. 11/24/17 07/21/18 Yes Bhagat, Bhavinkumar, PA  Calcium Carbonate-Vitamin D3 (CALCIUM 600-D) 600-400 MG-UNIT TABS Take 1 tablet by mouth every morning.    Yes [provider]  carvedilol (COREG) 25 MG tablet Take 1 tablet (25 mg total) by mouth 2 (two) times daily. 06/28/18 06/28/19 Yes Dorothy Spark, MD  DM-APAP-CPM (CORICIDIN HBP FLU PO) Take 2 tablets by mouth every 6 (six) hours as needed (flu symptoms).   Yes [provider]  etodolac (LODINE) 400 MG tablet Take 400 mg by mouth 2 (two) times daily. 06/05/18  Yes [provider]  fluticasone (FLONASE) 50 MCG/ACT nasal spray Place 1 spray into both nostrils daily as needed for allergies.    Yes [provider]  gabapentin (NEURONTIN) 300 MG capsule Take 600 mg by mouth at bedtime.  07/31/14  Yes [provider]  ketotifen (ZADITOR) 0.025 % ophthalmic solution Place 1 drop into the right eye 2  (two) times daily as needed (irritation).   Yes [provider]  levothyroxine (SYNTHROID, LEVOTHROID) 88 MCG tablet Take 88 mcg by mouth daily before breakfast.  01/08/18  Yes [provider]  losartan (COZAAR) 50 MG tablet Take 1 tablet (50 mg total) by mouth 2 (two) times daily. 10/26/17  Yes Bhagat, Bhavinkumar, PA  Multiple Vitamin (MULTIVITAMIN WITH MINERALS) TABS tablet Take 1 tablet by mouth daily.   Yes [provider]  Multiple Vitamins-Minerals (PRESERVISION AREDS 2 PO) Take 1 capsule by mouth at bedtime.    Yes [provider]  nitroGLYCERIN (NITROSTAT) 0.4 MG SL tablet Place 1 tablet (0.4 mg total) under the tongue every 5 (five) minutes as needed for chest pain. 10/17/17  Yes Cheryln Manly, NP  ondansetron (ZOFRAN ODT) 4 MG disintegrating tablet Take 1 tablet (4 mg total) by mouth every 8 (eight) hours as needed for nausea or vomiting. 04/07/18  Yes Lorin Glass, PA-C  pantoprazole (PROTONIX) 40 MG tablet Take 1 tablet (40 mg total) by mouth 2 (two) times daily. 10/17/17  Yes Cheryln Manly, NP  Polyethyl Glycol-Propyl Glycol (SYSTANE) 0.4-0.3 % SOLN Apply 1 drop to eye daily as needed.   Yes [provider]  promethazine (PHENERGAN) 25 MG tablet Take 25 mg by mouth every 6 (six) hours as needed for nausea or vomiting.   Yes [provider]    Physical Exam: Vitals:   07/21/18 0500 07/21/18 0630 07/21/18 0645 07/21/18 0700  BP: (!) 160/98 (!) 155/78  (!) 127/94  Pulse: 85 88 91 89  Resp: (!) _0 Temp:      TempSrc:      SpO2: 97% 94% 97% 95%  Weight:      Height:        Constitutional: NAD, calm, comfortable Vitals:   07/21/18 0500 07/21/18 0630 07/21/18 0645 07/21/18 0700  BP: (!) 160/98 (!) 155/78  (!) 127/94  Pulse: 85 88 91 89  Resp: (!) _1 Temp:      TempSrc:      SpO2: 97% 94% 97% 95%  Weight:      Height:       Eyes: PERRL, lids and conjunctivae normal ENMT: Mucous membranes  are moist. Posterior pharynx clear of any exudate or lesions.Normal dentition.  Patient has ecchymosis on  left supraorbital region.  No palpable fracture. Neck: normal, supple, no masses, no thyromegaly Respiratory: clear to auscultation bilaterally, no wheezing, no crackles. Normal respiratory effort. No accessory muscle use.  Cardiovascular: Regular rate and rhythm, no murmurs / rubs / gallops. No extremity edema. 2+ pedal pulses. No carotid bruits.  Abdomen: no tenderness, no masses palpated. No hepatosplenomegaly. Bowel sounds positive.  Musculoskeletal: no clubbing / cyanosis. No joint deformity upper and lower extremities. Good ROM, no contractures. Normal muscle tone.  Skin: no rashes, lesions, ulcers. No induration Psychiatric: Normal judgment and insight. Alert and oriented x 3. Normal mood.   Neurologic: CN 2-12 grossly intact. Sensation intact, DTR normal. Strength 5/5 in right upper and lower extremities.  Left arm and wrist with 4/5 power.  Superficial sensation decreased. Left leg 4/5.     Labs on Admission: I have personally reviewed following labs and imaging studies  CBC: Recent Labs  Lab 07/21/18 0358 07/21/18 0405  WBC 12.7*  --   NEUTROABS 10.0*  --   HGB 12.6 13.3  HCT 39.9 39.0  MCV 92.1  --   PLT 200  --    Basic Metabolic Panel: Recent Labs  Lab 07/21/18 0358 07/21/18 0405  NA 141 139  K 3.7 3.8  CL 105 105  CO2 26  --   GLUCOSE 130* 128*  BUN 17 20  CREATININE 1.05* 1.00  CALCIUM 10.7*  --    GFR: Estimated Creatinine Clearance: 39 mL/min (by C-G formula based on SCr of 1 mg/dL). Liver Function Tests: Recent Labs  Lab 07/21/18 0358  AST 20  ALT 21  ALKPHOS 61  BILITOT 0.8  PROT 5.9*  ALBUMIN 3.6   No results for input(s): LIPASE, AMYLASE in the last 168 hours. No results for input(s): AMMONIA in the last 168 hours. Coagulation Profile: Recent Labs  Lab 07/21/18 0358  INR 1.03   Cardiac Enzymes: No results for input(s):  CKTOTAL, CKMB, CKMBINDEX, TROPONINI in the last 168 hours. BNP (last 3 results) No results for input(s): PROBNP in the last 8760 hours. HbA1C: No results for input(s): HGBA1C in the last 72 hours. CBG: No results for input(s): GLUCAP in the last 168 hours. Lipid Profile: No results for input(s): CHOL, HDL, LDLCALC, TRIG, CHOLHDL, LDLDIRECT in the last 72 hours. Thyroid Function Tests: No results for input(s): TSH, T4TOTAL, FREET4, T3FREE, THYROIDAB in the last 72 hours. Anemia Panel: No results for input(s): VITAMINB12, FOLATE, FERRITIN, TIBC, IRON, RETICCTPCT in the last 72 hours. Urine analysis:    Component Value Date/Time   COLORURINE STRAW (A) 07/21/2018 0757   APPEARANCEUR CLEAR 07/21/2018 0757   LABSPEC 1.012 07/21/2018 0757   PHURINE 7.0 07/21/2018 [place] 07/21/2018 0757   HGBUR NEGATIVE 07/21/2018 0757   BILIRUBINUR NEGATIVE 07/21/2018 [place] 07/21/2018 0757   PROTEINUR NEGATIVE 07/21/2018 0757   UROBILINOGEN 0.2 08/19/2008 1026   NITRITE NEGATIVE 07/21/2018 0757   LEUKOCYTESUR NEGATIVE 07/21/2018 0757    Radiological Exams on Admission: Ct Head Code Stroke Wo Contrast  Result Date: 07/21/2018 CLINICAL DATA:  Code stroke. 78 y/o F; stroke with left-sided weakness. History of lung cancer. EXAM: CT HEAD WITHOUT CONTRAST TECHNIQUE: Contiguous axial images were obtained from the base of the skull through the vertex without intravenous contrast. COMPARISON:  09/18/2008 CT head. FINDINGS: Brain: At least 5 foci of mass effect with surrounding vasogenic edema in the right frontal lobe, right frontal parietal junction, right medial temporal lobe, left lateral temporal lobe, and  the left cerebellar hemisphere (series 3 image 10, 14, 16, 24) probably representing metastatic disease. No findings of hemorrhage, stroke, extra-axial collection, hydrocephalus, or herniation. Background of mild chronic microvascular ischemic changes and volume loss of  the brain. Vascular: Calcific atherosclerosis of carotid siphons. No hyperdense vessel identified. Skull: Normal. Negative for fracture or focal lesion. Large left frontal and periorbital scalp contusion with foci of hemorrhage measuring up to 2 cm. Sinuses/Orbits: No acute finding. Other: Bilateral intra-ocular lens replacement. ASPECTS [facility] Stroke Program Early CT Score) - Ganglionic level infarction (caudate, lentiform nuclei, internal capsule, insula, M1-M3 cortex): 7 - Supraganglionic infarction (M4-M6 cortex): 3 Total score (0-10 with 10 being normal): 10 IMPRESSION: 1. At least 5 foci of mass effect with extensive surrounding edema throughout the brain, likely metastatic disease. MRI of the brain with and without contrast is recommended for further evaluation. 2. Large left frontal and periorbital scalp contusion. No calvarial fracture or acute intracranial traumatic finding. 3. ASPECTS is not applicable. These results were called by telephone at the time of interpretation on 07/21/2018 at 4:10 am to Dr. Leonel Ramsay , who verbally acknowledged these results. Electronically Signed   By: Kristine Garbe M.D.   On: 07/21/2018 04:15    EKG: Independently reviewed.  Normal sinus rhythm.  No acute ST-T wave changes.  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  We will continue oral steroids after her IV steroid dose.  Work with PT OT.  I discussed case with oncologist who has seen patient in consultation.  Further management as per oncology.  She will benefit with rehab inpatient or outpatient.  Hypertension:  Fairly stable.  Will resume home medications.  Coronary artery disease: Status post coronary stent.  Severe bleeding with Plavix.  She does not want to be on any Plavix or aspirin.  Currently denies any chest pain.  Hypothyroidism: On Synthroid and euthyroid.  Met with patient and son at the bedside.  Detailed diagnostic discussion done.  Her husband suffered from brain tumor and died about 1 and half year ago and family was very familiar about treatment options about intracranial metastatic lesion and tumors.  She wished to discuss with oncology about any radiation treatment or palliative care.   DVT prophylaxis: SCDs. Code Status: DNR/DNI. Family Communication: Son at the bedside agreed with the plan. Disposition Plan: Home with home care versus acute rehab. Consults called: Heme-onc. Admission status: Inpatient general medical bed.   Barb Merino MD [facility] Pager [phone]  If 7PM-7AM, please contact night-coverage [email or web address] Password [facility]  07/21/2018, 8:28 AM

## 2018-07-22 ENCOUNTER — Other Ambulatory Visit: Payer: Self-pay | Admitting: Internal Medicine

## 2018-07-22 DIAGNOSIS — E039 Hypothyroidism, unspecified: Secondary | ICD-10-CM

## 2018-07-22 DIAGNOSIS — E782 Mixed hyperlipidemia: Secondary | ICD-10-CM

## 2018-07-22 MED ORDER — SODIUM CHLORIDE 0.9 % IV SOLN
INTRAVENOUS | Status: AC
  Administered 2018-07-22 – 2018-07-23 (×2): via INTRAVENOUS

## 2018-07-22 NOTE — Progress Notes (Signed)
PROGRESS NOTE  [name] NWG:[id number] DOB: [birth year] DOA: 07/21/2018 PCP: Wenda Low, MD  HPI/Brief Narrative  [name] is a 78 y.o. year old female with medical history significant for lung cancer status post right upper lobe lobectomy (2009) with left upper lobe recurrence in 2013 status post radiation, CKD stage III, HTN, HLD, hypothyroidism who presented on 07/21/2018 with 2 weeks of left-sided weakness, with predominant loss of strength in left hand x 2 weeks (since around Christmas).  She lost her balance and fell striking the left side of her face on the floor was down for unknown period of time with an unclear if LOC.  Due to significant left-sided weakness and inability to get up on her own, family called EMS and patient presented to [facility].  CT head was notable for multifocal intracranial metastasis with concern for recurrence of her non-small cell lung cancer.  Neurology recommended MRI brain with and without and Decadron for cerebral edema.Oncology evaluated, no current need for neurosurgical intervention patient will proceed with stereotactic radiation therapy as an outpatient arranged by oncology (her primary oncologist Dr. Earlie Server was made aware, and radiation oncologist Dr. Lisbeth Renshaw ).    Assessment/Plan:  Left-sided weakness, secondary to metastatic cancer to the brain Concern this represents recurrence of her non-small cell lung cancer, but melanoma is also on the differential.  Neurology initially but on board due to concern for stroke, MRI brain with no acute infarct but multiple parenchymal metastases.  No need for further neurologic intervention.  Continue Decadron for cerebral edema.  PT recommends CIR  Mild hypercalcemia Ca 10.7. Suspect likely related to dehydration, but could also be related to malignancy We will add IV fluids and repeat calcium in a.m.  Hypertension, slightly elevated currently with systolics in the 578I Continue home amlodipine,  Coreg And losartan  Hypothyroidism, stable Continue home Synthroid  GERD, stable continue home Protonix  Hyperlipidemia, stable continue home Lipitor  Peripheral neuropathy Continue gabapentin  CKD stage III Creatinine stable at baseline.  Avoid nephrotoxins, monitor kidney function.      Telemetry: Yes  DVT prophylaxis: SCDs Consultants: Neurology, oncology    Code Status: DNR  Family Communication: No family at bedside  Disposition Plan: Repeat calcium in a.m., continue IV fluids for suspected dehydration related to hypercalcemia, PT recommends CIR awaiting evaluation       Subjective Patient has no acute complaints.  Denies any changes in vision, still has left hand diminished strength and ability to grasp.  Objective: Vitals:   07/22/18 0456 07/22/18 0500 07/22/18 0737 07/22/18 1134  BP: (!) 143/81  (!) 173/87 (!) 157/70  Pulse: 87  78 77  Resp: 18  18 18   Temp: 97.8 F (36.6 C)  (!) 97.4 F (36.3 C) 97.9 F (36.6 C)  TempSrc: Oral  Oral Oral  SpO2: 95%  97% 97%  Weight:  58.1 kg    Height:        Intake/Output Summary (Last 24 hours) at 07/22/2018 1157 Last data filed at 07/22/2018 0737 Gross per 24 hour  Intake -  Output 950 ml  Net -950 ml   Filed Weights   07/21/18 0409 07/22/18 0000 07/22/18 0500  Weight: 56.7 kg 60.2 kg 58.1 kg    Exam:  Constitutional:normal appearing elderly female Eyes: EOMI, bruising surrounding left eye with swelling ENMT: Oropharynx with moist mucous membranes,  Neck: FROM Cardiovascular: RRR, systolic ejection murmur present, with no peripheral edema Respiratory: Normal respiratory effort on room air, clear breath  sounds  Abdomen: Soft,non-tender, normal bowel sounds Skin: No rash ulcers, or lesions. Without skin tenting  Neurologic: Decreased grip strength in left hand otherwise no focal neurologic deficits Psychiatric:Appropriate affect, and mood. Mental status AAOx3  Data Reviewed: CBC: Recent Labs    Lab 07/21/18 0358 07/21/18 0405  WBC 12.7*  --   NEUTROABS 10.0*  --   HGB 12.6 13.3  HCT 39.9 39.0  MCV 92.1  --   PLT 200  --    Basic Metabolic Panel: Recent Labs  Lab 07/21/18 0358 07/21/18 0405  NA 141 139  K 3.7 3.8  CL 105 105  CO2 26  --   GLUCOSE 130* 128*  BUN 17 20  CREATININE 1.05* 1.00  CALCIUM 10.7*  --    GFR: Estimated Creatinine Clearance: 39 mL/min (by C-G formula based on SCr of 1 mg/dL). Liver Function Tests: Recent Labs  Lab 07/21/18 0358  AST 20  ALT 21  ALKPHOS 61  BILITOT 0.8  PROT 5.9*  ALBUMIN 3.6   No results for input(s): LIPASE, AMYLASE in the last 168 hours. No results for input(s): AMMONIA in the last 168 hours. Coagulation Profile: Recent Labs  Lab 07/21/18 0358  INR 1.03   Cardiac Enzymes: No results for input(s): CKTOTAL, CKMB, CKMBINDEX, TROPONINI in the last 168 hours. BNP (last 3 results) No results for input(s): PROBNP in the last 8760 hours. HbA1C: No results for input(s): HGBA1C in the last 72 hours. CBG: No results for input(s): GLUCAP in the last 168 hours. Lipid Profile: No results for input(s): CHOL, HDL, LDLCALC, TRIG, CHOLHDL, LDLDIRECT in the last 72 hours. Thyroid Function Tests: No results for input(s): TSH, T4TOTAL, FREET4, T3FREE, THYROIDAB in the last 72 hours. Anemia Panel: No results for input(s): VITAMINB12, FOLATE, FERRITIN, TIBC, IRON, RETICCTPCT in the last 72 hours. Urine analysis:    Component Value Date/Time   COLORURINE STRAW (A) 07/21/2018 0757   APPEARANCEUR CLEAR 07/21/2018 0757   LABSPEC 1.012 07/21/2018 0757   PHURINE 7.0 07/21/2018 0757   GLUCOSEU NEGATIVE 07/21/2018 0757   HGBUR NEGATIVE 07/21/2018 0757   BILIRUBINUR NEGATIVE 07/21/2018 0757   KETONESUR NEGATIVE 07/21/2018 0757   PROTEINUR NEGATIVE 07/21/2018 0757   UROBILINOGEN 0.2 08/19/2008 1026   NITRITE NEGATIVE 07/21/2018 0757   LEUKOCYTESUR NEGATIVE 07/21/2018 0757   Sepsis  Labs: @LABRCNTIP (procalcitonin:4,lacticidven:4)  )No results found for this or any previous visit (from the past 240 hour(s)).    Studies: No results found.  Scheduled Meds: . acetaminophen  1,000 mg Oral Once  . acetaminophen  650 mg Oral Q6H  . amLODipine  5 mg Oral Daily  . atorvastatin  80 mg Oral Q2200  . calcium-vitamin D  2 tablet Oral Q breakfast  . carvedilol  25 mg Oral BID  . dexamethasone  4 mg Oral Q6H  . etodolac  400 mg Oral BID  . gabapentin  600 mg Oral QHS  . levothyroxine  88 mcg Oral QAC breakfast  . losartan  50 mg Oral BID  . multivitamin with minerals  1 tablet Oral Daily  . pantoprazole  40 mg Oral BID    Continuous Infusions:   LOS: 1 day     Desiree Hane, MD [facility]

## 2018-07-22 NOTE — [place] FL2 (Signed)
[place] MEDICAID FL2 LEVEL OF CARE SCREENING TOOL     IDENTIFICATION  Patient Name: [name] Birthdate: 11-04-1940 Sex: female Admission Date (Current Location): 07/21/2018  [facility] and [place] Number:  Herbalist and Address:  [facility], [place] [address], [place], [place] [address]      Provider Number: [id number]  Attending Physician Name and Address:  Desiree Hane, MD  Relative Name and Phone Number:  Marveen Reeks, [place], Daughter, [phone]    Current Level of Care: Hospital Recommended Level of Care: [place] Prior Approval Number: [id number]  Date Approved/Denied: 09/30/17 PASRR Number:    Discharge Plan: Home    Current Diagnoses: Patient Active Problem List   Diagnosis Date Noted  . Brain metastasis ([place]) 07/21/2018  . Left hemiparesis ([place]) 07/21/2018  . Lung cancer metastatic to brain ([place]) 07/21/2018  . Acute upper GI bleed   . Anemia due to acute blood loss   . Closed flail chest   . CAD (coronary artery disease) 09/21/2017  . Coronary stent thrombosis   . Endotracheal tube present   . CAD in native artery 09/20/2017  . Elevated serum creatinine 04/15/2016  . Acute kidney injury superimposed on chronic kidney disease ([place]) 03/24/2016  . Coronary artery calcification seen on CT scan   . CKD (chronic kidney disease), stage III ([place])   . Hypertension   . Hypothyroidism 03/15/2016  . Hyperlipidemia 08/19/2015  . Cancer of upper lobe of left lung ([place])   . Recurrent lung adenocarcinoma ([place]) 10/01/2014  . Local recurrence of left lung cancer ([place])   . Carotid artery disease without cerebral infarction ([place]) 05/20/2013  . Atherosclerosis of aorta ([place]) 05/10/2013  . Macular degeneration 05/10/2013  . Allergic rhinitis 10/20/2012  . Cancer of upper lobe of lung ([place]) 09/28/2011  . Melanoma ([place])   . LBBB (left bundle branch block)   . Gastritis due to  nonsteroidal anti-inflammatory drug 06/20/2011  . Hyperthyroidism   . Tremor   . Abdominal pain, RUQ 02/10/2011  . GERD with stricture 02/08/2011  . GOITER, MULTINODULAR 09/25/2009  . URI 09/02/2009  . Essential hypertension, benign 12/01/2008    Orientation RESPIRATION BLADDER Height & Weight     Self, Time, Situation, Place  Normal Incontinent(Has external catheter) Weight: 128 lb 1.4 oz (58.1 kg) Height:  5\' 3"  (160 cm)  BEHAVIORAL SYMPTOMS/MOOD NEUROLOGICAL BOWEL NUTRITION STATUS      Continent Diet(Regular diet)  AMBULATORY STATUS COMMUNICATION OF NEEDS Skin   Independent Verbally Normal, Surgical wounds(pretibial incision, closed and incision on nose, closed with bandage)                       Personal Care Assistance Level of Assistance  Bathing, Feeding, Dressing, Total care Bathing Assistance: Maximum assistance Feeding assistance: Independent Dressing Assistance: Maximum assistance Total Care Assistance: Maximum assistance   Functional Limitations Info  Sight, Hearing, Speech Sight Info: Impaired(Edema in left eye) Hearing Info: Adequate Speech Info: Adequate    SPECIAL CARE FACTORS FREQUENCY  PT (By licensed PT), OT (By licensed OT)     PT Frequency: 5x/wk OT Frequency: 5x/wk            Contractures Contractures Info: Not present    Additional Factors Info  Code Status, Allergies Code Status Info: DNR Allergies Info: : Meperidine Hcl, Morphine, Penicillins, Oxycodone-acetaminophen, Propoxyphene N-acetaminophen, Tramadol Hcl           Current  Medications (07/22/2018):  This is the current hospital active medication list Current Facility-Administered Medications  Medication Dose Route Frequency Provider Last Rate Last Dose  . acetaminophen (TYLENOL) tablet 1,000 mg  1,000 mg Oral Once Barb Merino, MD      . acetaminophen (TYLENOL) tablet 650 mg  650 mg Oral Q6H Barb Merino, MD   650 mg at 07/22/18 0501  . amLODipine (NORVASC) tablet 5 mg   5 mg Oral Daily Barb Merino, MD   5 mg at 07/22/18 0959  . atorvastatin (LIPITOR) tablet 80 mg  80 mg Oral Q2200 Barb Merino, MD   80 mg at 07/21/18 1951  . calcium-vitamin D (OSCAL WITH D) 500-200 MG-UNIT per tablet 2 tablet  2 tablet Oral Q breakfast Barb Merino, MD   2 tablet at 07/22/18 0817  . carvedilol (COREG) tablet 25 mg  25 mg Oral BID Barb Merino, MD   12.5 mg at 07/22/18 0959  . dexamethasone (DECADRON) tablet 4 mg  4 mg Oral Q6H Barb Merino, MD   4 mg at 07/22/18 0501  . etodolac (LODINE) tablet 400 mg  400 mg Oral BID Barb Merino, MD   400 mg at 07/22/18 0958  . gabapentin (NEURONTIN) capsule 600 mg  600 mg Oral QHS Barb Merino, MD   600 mg at 07/21/18 2253  . ketotifen (ZADITOR) 0.025 % ophthalmic solution 1 drop  1 drop Right Eye BID PRN Barb Merino, MD      . levothyroxine (SYNTHROID, LEVOTHROID) tablet 88 mcg  88 mcg Oral QAC breakfast Barb Merino, MD   88 mcg at 07/22/18 0501  . losartan (COZAAR) tablet 50 mg  50 mg Oral BID Barb Merino, MD   50 mg at 07/22/18 0958  . morphine (MSIR) tablet 7.5 mg  7.5 mg Oral Q4H PRN Barb Merino, MD   7.5 mg at 07/21/18 0514  . multivitamin with minerals tablet 1 tablet  1 tablet Oral Daily Barb Merino, MD   1 tablet at 07/22/18 0958  . nitroGLYCERIN (NITROSTAT) SL tablet 0.4 mg  0.4 mg Sublingual Q5 Min x 3 PRN Barb Merino, MD      . ondansetron (ZOFRAN-ODT) disintegrating tablet 4 mg  4 mg Oral Q8H PRN Barb Merino, MD      . pantoprazole (PROTONIX) EC tablet 40 mg  40 mg Oral BID Barb Merino, MD   40 mg at 07/22/18 0958  . promethazine (PHENERGAN) tablet 25 mg  25 mg Oral Q6H PRN Barb Merino, MD         Discharge Medications: Please see discharge summary for a list of discharge medications.  Relevant Imaging Results:  Relevant Lab Results:   Additional Information SSN: [id number]  [place], LCSWA

## 2018-07-22 NOTE — Evaluation (Addendum)
After conversation with PT and discussion of how she did during that session agree with CIR consult.  Occupational Therapy Evaluation Patient Details Name: [name] MRN: [id number] DOB: 1940/11/24 Today's Date: 07/22/2018    History of Present Illness 78 y.o. female with medical history significant of non-small cell lung cancer with right upper lobe lobectomy in 2009, recurrence in 2013 that was treated with radiation.  No known history of brain metastasis.  Followed by oncology and under surveillance presents to the hospital with fall at home because of losing balance and left arm getting progressively weaker. 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   Clinical Impression   Pt admitted with the above diagnoses and presents with below problem list. Pt will benefit from continued acute OT to address the below listed deficits and maximize independence with basic ADLs prior to d/c to venue below. At baseline, pt is independent with ADLs, lives alone. Pt presents with L side weakness and impaired coordination, LUE neglect noted. Pt is currently min A with bed mobility, mod A with UB ADLs, max A +2 assist for safety with LB ADLs, likely +2 assist for functional mobility. Pt aware of deficits, expresses fear of falling.     Follow Up Recommendations  CIR   Equipment Recommendations  Other (comment)(defer to next venue)    Recommendations for Other Services PT consult     Precautions / Restrictions Precautions Precautions: Fall Precaution Comments: L side neglect Restrictions Weight Bearing Restrictions: No      Mobility Bed Mobility Overal bed mobility: Needs Assistance Bed Mobility: Supine to Sit     Supine to sit: Min assist;HOB elevated     General bed mobility comments: Pt noted to neglect LUE during bed mobility. Assist to steady. Extra effort but no physical assist needed to scoot hips to full EOB position  Transfers Overall transfer level: Needs  assistance Equipment used: Rolling walker (2 wheeled) Transfers: Sit to/from [facility] Sit to Stand: Mod assist Stand pivot transfers: Max assist       General transfer comment: from EOB 2x then pivotal steps to recliner. Assist to steady with pt having a a difficulty time finding center of balance in standing. Able to powerup to standing with mod A, max A for unsteadiness during pivotal steps.    Balance Overall balance assessment: Needs assistance Sitting-balance support: Single extremity supported;Feet supported Sitting balance-Leahy Scale: Fair     Standing balance support: Single extremity supported;During functional activity Standing balance-Leahy Scale: Poor Standing balance comment: unable to fully utilize rw due to L side weakness/decreased proprioception.                           ADL either performed or assessed with clinical judgement   ADL Overall ADL's : Needs assistance/impaired Eating/Feeding: Minimal assistance;Sitting;Set up   Grooming: Moderate assistance;Sitting   Upper Body Bathing: Moderate assistance;Sitting   Lower Body Bathing: Maximal assistance;+2 for safety/equipment   Upper Body Dressing : Moderate assistance;Sitting   Lower Body Dressing: Maximal assistance;+2 for safety/equipment;Sit to/from stand   Toilet Transfer: Maximal assistance;Stand-pivot;BSC;RW Toilet Transfer Details (indicate cue type and reason): Simulated with EOB>recliner. Therapist providing steadying assist on [place], pt controlling rw with R hand only. Cues for timing of sitting into chair.           General ADL Comments: Pt completed bed mobility, sat EOB several minutes, stood 2x from EOB, side stepped left then right, then completed pivotal steps  to recliner. Discussed safety and strategies for LUE neglect.     Vision Baseline Vision/History: Wears glasses Wears Glasses: Reading only Patient Visual Report: No change from baseline        Perception Perception Perception Tested?: Yes Perception Deficits: Inattention/neglect Inattention/Neglect: Does not attend to left side of body   Praxis Praxis Praxis tested?: Deficits Deficits: Limb apraxia    Pertinent Vitals/Pain Pain Assessment: Faces Faces Pain Scale: Hurts a little bit Pain Location: back Pain Descriptors / Indicators: Aching Pain Intervention(s): Monitored during session     Hand Dominance     Extremity/Trunk Assessment Upper Extremity Assessment Upper Extremity Assessment: LUE deficits/detail LUE Deficits / Details: grossly 3+/5. LUE neglect noted. Unable to maintain functional grasp. LUE Sensation: decreased proprioception LUE Coordination: decreased fine motor;decreased gross motor   Lower Extremity Assessment Lower Extremity Assessment: Defer to PT evaluation       Communication Communication Communication: No difficulties   Cognition Arousal/Alertness: Awake/alert Behavior During Therapy: WFL for tasks assessed/performed Overall Cognitive Status: Within Functional Limits for tasks assessed                                     General Comments       Exercises Exercises: Other exercises Other Exercises Other Exercises: provided handheld therapy squeeze ball and instructed in use   Shoulder Instructions      Home Living Family/patient expects to be discharged to:: Private residence Living Arrangements: Alone Available Help at Discharge: Family Type of Home: House Home Access: Stairs to enter [employer] of Steps: 1 Entrance Stairs-Rails: Right Home Layout: Two level;Able to live on main level with bedroom/bathroom     Bathroom Shower/Tub: Occupational psychologist: Handicapped height Bathroom Accessibility: Yes   Home Equipment: Environmental consultant - 2 wheels;Walker - 4 wheels;Cane - single point;Bedside commode;Shower seat;Wheelchair - manual          Prior Functioning/Environment Level of  Independence: Independent                 OT Problem List: Decreased strength;Decreased range of motion;Decreased activity tolerance;Impaired balance (sitting and/or standing);Decreased coordination;Decreased knowledge of use of DME or AE;Decreased knowledge of precautions;Impaired tone;Impaired UE functional use;Pain      OT Treatment/Interventions: Self-care/ADL training;Therapeutic exercise;Neuromuscular education;DME and/or AE instruction;Therapeutic activities;Patient/family education;Balance training    OT Goals(Current goals can be found in the care plan section) Acute Rehab OT Goals Patient Stated Goal: rehab then home OT Goal Formulation: With patient Time For Goal Achievement: 08/05/18 Potential to Achieve Goals: Good ADL Goals Pt Will Perform Grooming: with modified independence;sitting Pt Will Perform Upper Body Bathing: with modified independence;sitting Pt Will Perform Lower Body Bathing: with min assist;sit to/from stand Pt Will Perform Upper Body Dressing: with modified independence;sitting Pt Will Perform Lower Body Dressing: with min assist;sit to/from stand Pt Will Transfer to Toilet: with min assist;ambulating Pt Will Perform Toileting - Clothing Manipulation and hygiene: with min assist;sit to/from stand Pt/caregiver will Perform Home Exercise Program: Left upper extremity;Increased strength;With written HEP provided;With Supervision  OT Frequency: Min 2X/week   Barriers to D/C:            Co-evaluation              AM-PAC OT "6 Clicks" Daily Activity     Outcome Measure Help from another person eating meals?: A Little Help from another person taking care of personal grooming?: A Lot Help  from another person toileting, which includes using toliet, bedpan, or urinal?: A Lot Help from another person bathing (including washing, rinsing, drying)?: A Lot Help from another person to put on and taking off regular upper body clothing?: A Little Help from  another person to put on and taking off regular lower body clothing?: A Lot 6 Click Score: 14   End of Session Equipment Utilized During Treatment: Gait belt;Rolling walker Nurse Communication: Mobility status  Activity Tolerance: Patient tolerated treatment well Patient left: in chair;with call bell/phone within reach;with nursing/sitter in room(chair alarm pad under pt but off as NT putting puriwick on)  OT Visit Diagnosis: Unsteadiness on feet (R26.81);Muscle weakness (generalized) (M62.81);History of falling (Z91.81);Hemiplegia and hemiparesis;Pain;Ataxia, unspecified (R27.0);Other abnormalities of gait and mobility (R26.89) Hemiplegia - Right/Left: Left                Time: 4035-2481 OT Time Calculation (min): 30 min Charges:  OT General Charges $OT Visit: 1 Visit OT Evaluation $OT Eval Moderate Complexity: 1 Mod OT Treatments $Self Care/Home Management : 8-22 mins  Tyrone Schimke, OT Acute Rehabilitation Services Pager: [phone] Office: [phone]   Hortencia Pilar 07/22/2018, 12:05 PM

## 2018-07-22 NOTE — Progress Notes (Signed)
NEUROLOGY PROGRESS NOTE  Subjective:Left-sided weakness -New brain metastases  PMH- History of lung cancer which was initially in her right lobe and returned recurrent on the left side.s/p RULobectomy in 2009, with LUL recurrence 2013 and in a prevascular node March 2015. S/p radiation but not chemo CKD stage 3, HTN, HLD, hypothyroid, mac degeneration, HA     She appears in good spirits this morning. She is aware of her diagnosis and states that "it its what it is." and its out of her control. At this point she will be followed by hematology. She does notice and improvement.   Exam: Vitals:   07/22/18 0456 07/22/18 0737  BP: (!) 143/81 (!) 173/87  Pulse: 87 78  Resp: 18 18  Temp: 97.8 F (36.6 C) (!) 97.4 F (36.3 C)  SpO2: 95% 97%    Physical Exam  Actually in good spirit despite of  HEENT-  Normocephalic, no lesions, without obvious abnormality.  Normal external eye and conjunctiva. bruising around eye with some edema  Cardiovascular- S1-S2  Lungs-no rhonchi or wheezing noted, no excessive working breathing.  Saturations within normal limits Abdomen- All 4 quadrants palpated and nontender Extremities- Warm, dry and intact Musculoskeletal-no joint tenderness, deformity or swelling Skin-warm and dry, no hyperpigmentation, vitiligo, or suspicious lesions  Mental Status: Patient is awake, alert, oriented to person, place, month, year, and situation.  No signs of aphasia, Cranial Nerves: II: Visual Fields are full. Pupils are equal, round, and reactive to light.   III,IV, VI: EOMI without ptosis or diploplia.  V: Facial sensation is symmetric VII: Facial movement with left facial weakness VIII: hearing is intact to voice X: Uvula elevates symmetrically XI: Shoulder shrug is symmetric. XII: tongue is midline without atrophy or fasciculations.  Motor: RUE, RLE 5/5, LLE, 5/5, LUE 4-/5 proximal , distal remains 3/5 with ability to mildly grasp, without functionality - patient  is right handed fortunately  Sensory: Sensation is intact bilaterally- previously without sensation on L foot  Cerebellar: Intact on right side    Medications:  Prior to Admission:  Medications Prior to Admission  Medication Sig Dispense Refill Last Dose  . acetaminophen (TYLENOL) 650 MG CR tablet Take 1,300 mg by mouth 2 (two) times daily.    07/20/2018 at Unknown time  . amLODipine (NORVASC) 10 MG tablet Take 0.5 tablets (5 mg total) by mouth daily. 30 tablet 11 07/20/2018 at Unknown time  . atorvastatin (LIPITOR) 80 MG tablet Take 1 tablet (80 mg total) by mouth daily. 90 tablet 3 07/20/2018 at Unknown time  . Calcium Carbonate-Vitamin D3 (CALCIUM 600-D) 600-400 MG-UNIT TABS Take 1 tablet by mouth every morning.    07/20/2018 at Unknown time  . carvedilol (COREG) 25 MG tablet Take 1 tablet (25 mg total) by mouth 2 (two) times daily. 60 tablet 11 07/20/2018 at 1930  . DM-APAP-CPM (CORICIDIN HBP FLU PO) Take 2 tablets by mouth every 6 (six) hours as needed (flu symptoms).   Past Week at Unknown time  . etodolac (LODINE) 400 MG tablet Take 400 mg by mouth 2 (two) times daily.  3 07/20/2018 at PM  . fluticasone (FLONASE) 50 MCG/ACT nasal spray Place 1 spray into both nostrils daily as needed for allergies.    unk  . gabapentin (NEURONTIN) 300 MG capsule Take 600 mg by mouth at bedtime.   2 07/20/2018 at Unknown time  . ketotifen (ZADITOR) 0.025 % ophthalmic solution Place 1 drop into the right eye 2 (two) times daily as needed (irritation).  Past Week at Unknown time  . levothyroxine (SYNTHROID, LEVOTHROID) 88 MCG tablet Take 88 mcg by mouth daily before breakfast.    07/20/2018 at Unknown time  . losartan (COZAAR) 50 MG tablet Take 1 tablet (50 mg total) by mouth 2 (two) times daily. 180 tablet 3 07/20/2018 at PM  . Multiple Vitamin (MULTIVITAMIN WITH MINERALS) TABS tablet Take 1 tablet by mouth daily.   07/20/2018 at Unknown time  . Multiple Vitamins-Minerals (PRESERVISION AREDS 2 PO) Take 1 capsule  by mouth at bedtime.    07/20/2018 at Unknown time  . nitroGLYCERIN (NITROSTAT) 0.4 MG SL tablet Place 1 tablet (0.4 mg total) under the tongue every 5 (five) minutes as needed for chest pain.  0 unk  . ondansetron (ZOFRAN ODT) 4 MG disintegrating tablet Take 1 tablet (4 mg total) by mouth every 8 (eight) hours as needed for nausea or vomiting. 10 tablet 0 07/20/2018 at Unknown time  . pantoprazole (PROTONIX) 40 MG tablet Take 1 tablet (40 mg total) by mouth 2 (two) times daily.   07/20/2018 at Unknown time  . Polyethyl Glycol-Propyl Glycol (SYSTANE) 0.4-0.3 % SOLN Apply 1 drop to eye daily as needed.   Past Week at Unknown time  . promethazine (PHENERGAN) 25 MG tablet Take 25 mg by mouth every 6 (six) hours as needed for nausea or vomiting.   07/20/2018 at Unknown time   Scheduled: . acetaminophen  1,000 mg Oral Once  . acetaminophen  650 mg Oral Q6H  . amLODipine  5 mg Oral Daily  . atorvastatin  80 mg Oral Q2200  . calcium-vitamin D  2 tablet Oral Q breakfast  . carvedilol  25 mg Oral BID  . dexamethasone  4 mg Oral Q6H  . etodolac  400 mg Oral BID  . gabapentin  600 mg Oral QHS  . levothyroxine  88 mcg Oral QAC breakfast  . losartan  50 mg Oral BID  . multivitamin with minerals  1 tablet Oral Daily  . pantoprazole  40 mg Oral BID   Continuous:  SMO:[id number], morphine, nitroGLYCERIN, ondansetron, promethazine  Pertinent Labs/Diagnostics: I have reviewed labs in epic and the results pertinent to this consultation are: Mild leukocytosis 12.7- possibly r/t steroids   Mr Jeri Cos Wo Contrast  Result Date: 07/21/2018 CLINICAL DATA:  Non-small cell lung cancer. Metastatic disease. Progression. Abnormal CT head without contrast EXAM: MRI HEAD WITHOUT AND WITH CONTRAST TECHNIQUE: Multiplanar, multiecho pulse sequences of the brain and surrounding structures were obtained without and with intravenous contrast. CONTRAST:  5 mL Gadavist COMPARISON:  CT head without contrast 07/21/2018 FINDINGS:  Brain: Multiple peripherally enhancing lesions are present with associated edema. Findings are consistent with metastatic disease to the brain. The lesion in the right parietal lobe measures 14 x 14 x 14 mm on image 14 of series 16. A posterior right parietal lesion measures 8 x 10 x 16 mm on image 40 of series 16. A medial anterior right frontal lobe lesion measures 21 x 17 x 22 mm on image 35 of series 16. A 3 mm right occipital lesion is best seen on the sagittal images. This is on image 7 of series 18. An anterior inferior left frontal lobe lesion measures 4 x 5 x 5 mm on image 26 of series 16. A left temporal lobe lesion measures 12 x 13 x 11 mm image 23 of series 16 A left anterior pontine lesion measures 8 x 8 x 9 mm on image 23 of series 16. A left  cerebellar lesion measures 11 x 12 x 14 mm on image 21 of series 16. Restricted diffusion is present in the anterior right frontal lobe and lateral right parietal lobe lesions. There is also some restricted diffusion associated with the left temporal lobe lesion. The right frontal and parietal lobe lesions demonstrate the greatest degree of surrounding edema. There is some edema surrounding each of the other lesions as well. No acute infarct is present. Periventricular and subcortical white matter changes are present in addition to the edema surrounding the metastases. Vascular: Flow is present in the major intracranial arteries. Skull and upper cervical spine: The craniocervical junction is normal. Upper cervical spine is within normal limits. Marrow signal is unremarkable. Sinuses/Orbits: The paranasal sinuses and mastoid air cells are clear. Bilateral lens replacements are noted. Globes and orbits are otherwise unremarkable. Other: 2 separate subcutaneous lesions are present lateral and superior to the left orbit. There is extensive surrounding edema. The lesions measure 13 x 15 and 14 x 14 mm respectively. IMPRESSION: 1. At least 8 parenchymal metastases as  described above. 2. 2 left periorbital subcutaneous lesions with marked surrounding edema likely reflect subcutaneous metastases. 3. Mild atrophy and white matter disease otherwise likely reflects the sequela of chronic microvascular ischemia. Electronically Signed   By: San Morelle M.D.   On: 07/21/2018 11:18   Ct Head Code Stroke Wo Contrast  Result Date: 07/21/2018 CLINICAL DATA:  Code stroke. 78 y/o F; stroke with left-sided weakness. History of lung cancer. EXAM: CT HEAD WITHOUT CONTRAST TECHNIQUE: Contiguous axial images were obtained from the base of the skull through the vertex without intravenous contrast. COMPARISON:  09/18/2008 CT head. FINDINGS: Brain: At least 5 foci of mass effect with surrounding vasogenic edema in the right frontal lobe, right frontal parietal junction, right medial temporal lobe, left lateral temporal lobe, and the left cerebellar hemisphere (series 3 image 10, 14, 16, 24) probably representing metastatic disease. No findings of hemorrhage, stroke, extra-axial collection, hydrocephalus, or herniation. Background of mild chronic microvascular ischemic changes and volume loss of the brain. Vascular: Calcific atherosclerosis of carotid siphons. No hyperdense vessel identified. Skull: Normal. Negative for fracture or focal lesion. Large left frontal and periorbital scalp contusion with foci of hemorrhage measuring up to 2 cm. Sinuses/Orbits: No acute finding. Other: Bilateral intra-ocular lens replacement. ASPECTS [facility] Stroke Program Early CT Score) - Ganglionic level infarction (caudate, lentiform nuclei, internal capsule, insula, M1-M3 cortex): 7 - Supraganglionic infarction (M4-M6 cortex): 3 Total score (0-10 with 10 being normal): 10 IMPRESSION: 1. At least 5 foci of mass effect with extensive surrounding edema throughout the brain, likely metastatic disease. MRI of the brain with and without contrast is recommended for further evaluation. 2. Large left frontal and  periorbital scalp contusion. No calvarial fracture or acute intracranial traumatic finding. 3. ASPECTS is not applicable. These results were called by telephone at the time of interpretation on 07/21/2018 at 4:10 am to Dr. Leonel Ramsay , who verbally acknowledged these results. Electronically Signed   By: Kristine Garbe M.D.   On: 07/21/2018 04:15     I have reviewed the images obtained:  CT head w/o contrast  multifocal intracranial metastasis   MRI W/WO contrast  1. At least 8 parenchymal metastases . 2. 2 left periorbital subcutaneous lesions with marked surrounding edema likely reflect subcutaneous metastases. 3. Mild atrophy and white matter disease otherwise likely reflects the sequela of chronic microvascular ischemia.  Impression: 78 year old female who presents with left-sided weakness and is found to have metastatic  cancer to the brain.  I suspect this represents her non-small cell lung cancer given the active disease process rather than melanoma.  She does have significant edema, and therefore may have some benefit from steroids.  Hem/onc has been followed and will follow from here per notation  Recommendations: 1) Hematology consulted and seen today  2) PT OT 3) Decadron 10 mg IV x1 followed by 46m q6h 4) sign off   ALilyan Gilfordneurohospitalist, NP  07/22/2018, 9:34 AM    NEUROHOSPITALIST ADDENDUM Performed a face to face diagnostic evaluation.   I have reviewed the contents of history and physical exam as documented by PA/ARNP/Resident and agree with above documentation.  I have discussed and formulated the above plan as documented. Edits to the note have been made as needed.  78year old female with left-sided weakness, most notably affecting her left hand.  MRI brain confirms metastatic cancer with multiple lesions associated with edema.  Denies any episodes concerning for seizures just twitching over left arm. Patient will need to follow-up with  oncology, consider follow-up with your oncologist Dr. VMickeal Skinner  Neurology will sign off.    SKarena AddisonAroor MD [facility] [phone]  If 7pm to 7am, please call on call as listed on AMION.

## 2018-07-22 NOTE — Evaluation (Signed)
Physical Therapy Evaluation Patient Details Name: [name] MRN: [id number] DOB: [birth year] Today's Date: 07/22/2018   History of Present Illness  78 y.o. female with medical history significant of non-small cell lung cancer with right upper lobe lobectomy in 2009, recurrence in 2013 that was treated with radiation.  No known history of brain metastasis.  Followed by oncology and under surveillance presents to the hospital with fall at home because of losing balance and left arm getting progressively weaker. 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    Clinical Impression  Pt presents with significant limitations to functional mobility due to decr motor control, dyscoordination, weakness, balance impairment, hemi-neglect, resulting in dependencies in all mobility tasks to change positions and move around.  Pt will benefit from postacute rehab to address deficits and promote restoration of maximum function prior to d/c home.  Place CIR screen to determine if pt is candidate, and if not, pt prefers [facility] for rehab.  Though pt lives alone she has a supportive children.  PT will initiate care in acute setting and assist with d/c as necessary.    Follow Up Recommendations CIR(screen for admission)    Equipment Recommendations  None recommended by PT    Recommendations for Other Services Rehab consult     Precautions / Restrictions Precautions Precautions: Fall Precaution Comments: L weakness/neglect and fall at home due to same Restrictions Weight Bearing Restrictions: No      Mobility  Bed Mobility Overal bed mobility: Needs Assistance Bed Mobility: Supine to Sit     Supine to sit: Min assist;HOB elevated     General bed mobility comments: pt up in/back to chair  Transfers Overall transfer level: Needs assistance Equipment used: Rolling walker (2 wheeled) Transfers: Sit to/from Stand Sit to Stand: Min assist Stand pivot transfers: Max assist       General  transfer comment: able to scoot to EOB and use R hemibody to lift to standing, needs assist to bring trunk to neutral and preoccupied by clumsy hand  Ambulation/Gait Ambulation/Gait assistance: Max assist Gait Distance (Feet): 5 Feet Assistive device: Rolling walker (2 wheeled) Gait Pattern/deviations: Step-through pattern;Decreased stride length;Decreased weight shift to left     General Gait Details: provided full body support hip to hip with total assist to keep grip on RW, pt able to advance LLE forward and backward with incr effort and time, noting instabilty in knee during wt shift and oversteers with R due to poor LUE control.  Fatigued by end of 5 foward 5 back   Stairs            Wheelchair Mobility    Modified Rankin (Stroke Patients Only) Modified Rankin (Stroke Patients Only) Pre-Morbid Rankin Score: No significant disability Modified Rankin: Moderately severe disability     Balance Overall balance assessment: Needs assistance;History of Falls Sitting-balance support: Feet supported;No upper extremity supported Sitting balance-Leahy Scale: Fair     Standing balance support: Single extremity supported;During functional activity Standing balance-Leahy Scale: Poor Standing balance comment: dependent on external support to stand, unable to shift weight or step without assistance                             Pertinent Vitals/Pain Pain Assessment: 0-10 Pain Score: 3  Faces Pain Scale: Hurts a little bit Pain Location: generally left side where she fell Pain Descriptors / Indicators: Aching;Sore;Tender Pain Intervention(s): Limited activity within patient's tolerance;Monitored during session;Repositioned    Home Living  Family/patient expects to be discharged to:: Private residence Living Arrangements: Alone Available Help at Discharge: Family Type of Home: House Home Access: Stairs to enter Entrance Stairs-Rails: Right Entrance Stairs-Number of  Steps: 1 Home Layout: Two level;Able to live on main level with bedroom/bathroom Home Equipment: Gilford Rile - 2 wheels;Walker - 4 wheels;Cane - single point;Bedside commode;Shower seat;Wheelchair - manual Additional Comments: Pt lives alone but has family who can stay with her at d/c if needed    Prior Function Level of Independence: Independent               Hand Dominance   Dominant Hand: Right    Extremity/Trunk Assessment   Upper Extremity Assessment Upper Extremity Assessment: Defer to OT evaluation(LUE ataxia/lack of motor control, no grip) LUE Deficits / Details: grossly 3+/5. LUE neglect noted. Unable to maintain functional grasp. LUE Sensation: decreased proprioception LUE Coordination: decreased fine motor;decreased gross motor    Lower Extremity Assessment Lower Extremity Assessment: LLE deficits/detail LLE Deficits / Details: 3/5 max at knee/ankle, able to hold knee extension to flex ankle 2-3 reps max; knee occasionally to frequently buckles during gait cycle LLE Coordination: decreased gross motor       Communication   Communication: No difficulties  Cognition Arousal/Alertness: Awake/alert Behavior During Therapy: WFL for tasks assessed/performed Overall Cognitive Status: Within Functional Limits for tasks assessed                                 General Comments: pleasent and engaged      General Comments General comments (skin integrity, edema, etc.): left eye bruised and swollen, left elbow/shoudler sore, left wrist abrasion bandaged    Exercises Other Exercises Other Exercises: LE: sustained knee extension for repetitive ankle df/pf goal 3-5 reps prior to fatigue (bilateral) Other Exercises: discussed looking at and stroking LUE to promote awareness/attention and maximize neural reorganization    Assessment/Plan    PT Assessment Patient needs continued PT services  PT Problem List Impaired sensation;Decreased coordination;Decreased  mobility;Decreased balance;Decreased activity tolerance;Decreased range of motion;Decreased strength       PT Treatment Interventions Gait training;DME instruction;Functional mobility training;Therapeutic activities;Therapeutic exercise;Balance training;Neuromuscular re-education;Patient/family education    PT Goals (Current goals can be found in the Care Plan section)  Acute Rehab PT Goals Patient Stated Goal: rehab then home PT Goal Formulation: With patient/family Time For Goal Achievement: 08/05/18 Potential to Achieve Goals: Good    Frequency Min 4X/week   Barriers to discharge Decreased caregiver support lives alone, has supportive family but reluctant to 'be a burden'    Co-evaluation               AM-PAC PT "6 Clicks" Mobility  Outcome Measure Help needed turning from your back to your side while in a flat bed without using bedrails?: A Little Help needed moving from lying on your back to sitting on the side of a flat bed without using bedrails?: A Little Help needed moving to and from a bed to a chair (including a wheelchair)?: A Lot Help needed standing up from a chair using your arms (e.g., wheelchair or bedside chair)?: A Little Help needed to walk in hospital room?: A Lot Help needed climbing 3-5 steps with a railing? 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  Herbie Drape 07/22/2018, 12:59 PM

## 2018-07-23 ENCOUNTER — Inpatient Hospital Stay (HOSPITAL_COMMUNITY): Payer: Medicare Other

## 2018-07-23 ENCOUNTER — Ambulatory Visit
Admit: 2018-07-23 | Discharge: 2018-07-23 | Disposition: A | Discharge status: Home / Self Care | Payer: Medicare Other | Attending: Radiation Oncology | Admitting: Radiation Oncology

## 2018-07-23 ENCOUNTER — Telehealth: Payer: Self-pay | Admitting: Radiation Oncology

## 2018-07-23 ENCOUNTER — Encounter (HOSPITAL_COMMUNITY): Payer: Self-pay | Admitting: Radiation Oncology

## 2018-07-23 DIAGNOSIS — C3410 Malignant neoplasm of upper lobe, unspecified bronchus or lung: Secondary | ICD-10-CM

## 2018-07-23 DIAGNOSIS — C3490 Malignant neoplasm of unspecified part of unspecified bronchus or lung: Secondary | ICD-10-CM

## 2018-07-23 DIAGNOSIS — C3491 Malignant neoplasm of unspecified part of right bronchus or lung: Secondary | ICD-10-CM

## 2018-07-23 DIAGNOSIS — E785 Hyperlipidemia, unspecified: Secondary | ICD-10-CM

## 2018-07-23 DIAGNOSIS — G8194 Hemiplegia, unspecified affecting left nondominant side: Secondary | ICD-10-CM

## 2018-07-23 DIAGNOSIS — I739 Peripheral vascular disease, unspecified: Secondary | ICD-10-CM

## 2018-07-23 DIAGNOSIS — N183 Chronic kidney disease, stage 3 (moderate): Secondary | ICD-10-CM

## 2018-07-23 DIAGNOSIS — D72829 Elevated white blood cell count, unspecified: Secondary | ICD-10-CM

## 2018-07-23 DIAGNOSIS — I10 Essential (primary) hypertension: Secondary | ICD-10-CM

## 2018-07-23 DIAGNOSIS — C3412 Malignant neoplasm of upper lobe, left bronchus or lung: Secondary | ICD-10-CM

## 2018-07-23 DIAGNOSIS — C7931 Secondary malignant neoplasm of brain: Principal | ICD-10-CM

## 2018-07-23 LAB — COMPREHENSIVE METABOLIC PANEL
ALT: 19 U/L (ref 0–44)
ANION GAP: 8 (ref 5–15)
AST: 21 U/L (ref 15–41)
Albumin: 3 g/dL — ABNORMAL LOW (ref 3.5–5.0)
Alkaline Phosphatase: 48 U/L (ref 38–126)
BUN: 26 mg/dL — ABNORMAL HIGH (ref 8–23)
CO2: 24 mmol/L (ref 22–32)
CREATININE: 1.03 mg/dL — AB (ref 0.44–1.00)
Calcium: 8.8 mg/dL — ABNORMAL LOW (ref 8.9–10.3)
Chloride: 108 mmol/L (ref 98–111)
GFR calc Af Amer: >60 mL/min (ref >60)
GFR calc non Af Amer: 52 mL/min — ABNORMAL LOW (ref >60)
Glucose, Bld: 132 mg/dL — ABNORMAL HIGH (ref 70–99)
Potassium: 3.8 mmol/L (ref 3.5–5.1)
Sodium: 140 mmol/L (ref 135–145)
Total Bilirubin: 0.5 mg/dL (ref 0.3–1.2)
Total Protein: 5.2 g/dL — ABNORMAL LOW (ref 6.5–8.1)

## 2018-07-23 MED ORDER — AMLODIPINE BESYLATE 10 MG PO TABS
10.0000 mg | ORAL_TABLET | Freq: Every day | ORAL | Status: DC
  Administered 2018-07-24: 10 mg via ORAL
  Filled 2018-07-23: qty 1

## 2018-07-23 MED ORDER — GADOBUTROL 1 MMOL/ML IV SOLN
6.0000 mL | Freq: Once | INTRAVENOUS | Status: AC | PRN
  Administered 2018-07-23: 6 mL via INTRAVENOUS

## 2018-07-23 MED ORDER — AMLODIPINE BESYLATE 5 MG PO TABS
5.0000 mg | ORAL_TABLET | Freq: Once | ORAL | Status: DC
  Filled 2018-07-23: qty 1

## 2018-07-23 NOTE — Progress Notes (Signed)
Physical Therapy Treatment Patient Details Name: [name] MRN: [id number] DOB: [birth year] Today's Date: 07/23/2018    History of Present Illness 78 y.o. female with medical history significant of non-small cell lung cancer with right upper lobe lobectomy in 2009, recurrence in 2013 that was treated with radiation.  No known history of brain metastasis.  Followed by oncology and under surveillance presents to the hospital with fall at home because of losing balance and left arm getting progressively weaker. 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    PT Comments    Patient progressing with ambulation in hallway this session.  Well fatigued afterward and had brain mapping in MRI this morning.  Continue to feel she is appropriate for inpatient rehab prior to d/c home with family support.  PT to follow acutely.   Follow Up Recommendations  CIR     Equipment Recommendations  None recommended by PT    Recommendations for Other Services       Precautions / Restrictions Precautions Precautions: Fall Precaution Comments: L weakness/neglect and fall at home due to same    Mobility  Bed Mobility Overal bed mobility: Needs Assistance Bed Mobility: Rolling;Sidelying to Sit Rolling: Supervision Sidelying to sit: Supervision       General bed mobility comments: cues for technique  Transfers Overall transfer level: Needs assistance Equipment used: Rolling walker (2 wheeled) Transfers: Sit to/from Stand Sit to Stand: Min assist;+2 safety/equipment         General transfer comment: assist for safety, cues for hand placement  Ambulation/Gait Ambulation/Gait assistance: Mod assist;+2 safety/equipment(chair follow) Gait Distance (Feet): 40 Feet Assistive device: Rolling walker (2 wheeled);1 person hand held assist Gait Pattern/deviations: Decreased step length - left;Decreased weight shift to right;Wide base of support     General Gait Details: assist with walker to  keep L hand on walker, then provided R HHA without walker and continued to walk, though pt more fearful of falling.  Assist for R lateral weight shift to improve L foot clearance   Stairs             Wheelchair Mobility    Modified Rankin (Stroke Patients Only) Modified Rankin (Stroke Patients Only) Pre-Morbid Rankin Score: No significant disability Modified Rankin: Moderately severe disability     Balance     Sitting balance-Leahy Scale: Fair       Standing balance-Leahy Scale: Poor Standing balance comment: overshifts to L and assist to weight shift R; worked on during gait; too fatigued to stand again and work on Product manager Arousal/Alertness: Awake/alert Behavior During Therapy: WFL for tasks assessed/performed Overall Cognitive Status: Within Functional Limits for tasks assessed                                        Exercises      General Comments General comments (skin integrity, edema, etc.): son in room initially, left during session; continued with L eye brusing; reports went for mapping for surgery earlier today      Pertinent Vitals/Pain Faces Pain Scale: Hurts little more Pain Location: L thigh "strained" when trying to get up from floor Pain Descriptors / Indicators: Sore;Aching Pain Intervention(s): Monitored during session;Repositioned    Home Living  Prior Function            PT Goals (current goals can now be found in the care plan section) Progress towards PT goals: Progressing toward goals    Frequency    Min 4X/week      PT Plan Current plan remains appropriate    Co-evaluation              AM-PAC PT "6 Clicks" Mobility   Outcome Measure  Help needed turning from your back to your side while in a flat bed without using bedrails?: A Little Help needed moving from lying on your back to sitting on the side of a flat bed without  using bedrails?: A Little Help needed moving to and from a bed to a chair (including a wheelchair)?: A Little Help needed standing up from a chair using your arms (e.g., wheelchair or bedside chair)?: A Little Help needed to walk in hospital room?: A Lot Help needed climbing 3-5 steps with a railing? : Total 6 Click Score: 15    End of Session Equipment Utilized During Treatment: Gait belt Activity Tolerance: Patient limited by fatigue Patient left: in chair;with call bell/phone within reach;with chair alarm set   PT Visit Diagnosis: Ataxic gait (R26.0);Hemiplegia and hemiparesis;Other symptoms and signs involving the nervous system (R29.898);Other abnormalities of gait and mobility (R26.89) Hemiplegia - Right/Left: Left Hemiplegia - dominant/non-dominant: Non-dominant Hemiplegia - caused by: Unspecified(brain metastasis)     Time: 1410-1434 PT Time Calculation (min) (ACUTE ONLY): 24 min  Charges:  $Gait Training: 8-22 mins $Therapeutic Activity: 8-22 mins                     [name], [name] [phone] 07/23/2018    [name] 07/23/2018, 3:45 PM

## 2018-07-23 NOTE — Progress Notes (Signed)
PROGRESS NOTE  [name] RKY:[id number] DOB: 04-07-1941 DOA: 07/21/2018 PCP: Wenda Low, MD  HPI/Brief Narrative  [name] is a 78 y.o. year old female with medical history significant for lung cancer status post right upper lobe lobectomy (2009) with left upper lobe recurrence in 2013 status post radiation, CKD stage III, HTN, HLD, hypothyroidism who presented on 07/21/2018 with 2 weeks of left-sided weakness, with predominant loss of strength in left hand x 2 weeks (since around Christmas).  She lost her balance and fell striking the left side of her face on the floor was down for unknown period of time with an unclear if LOC.  Due to significant left-sided weakness and inability to get up on her own, family called EMS and patient presented to [facility].  CT head was notable for multifocal intracranial metastasis with concern for recurrence of her non-small cell lung cancer.  Neurology recommended MRI brain with and without and Decadron for cerebral edema.Oncology evaluated, no current need for neurosurgical intervention patient will proceed with stereotactic radiation therapy as an outpatient arranged by oncology (her primary oncologist Dr. Earlie Server was made aware, and radiation oncologist Dr. Lisbeth Renshaw ).    Assessment/Plan:  Left-sided weakness, secondary to metastatic cancer to the brain Concern this represents recurrence of her non-small cell lung cancer, but melanoma is also on the differential.  Neurology initially but on board due to concern for stroke, MRI brain with no acute infarct but multiple parenchymal metastases.  No need for further neurologic intervention.  Continue Decadron for cerebral edema.  PT recommends CIR, currently undergoing evaluation.  Will discuss with patient palliative care consultation in terms of symptom management and comprehensive care.  Mild hypercalcemia, resolved Return to normal limits.. Suspect likely related to dehydration has not improved  significantly with IV fluids.  Continue to monitor closely  Hypertension, still quite elevated with systolics in the 315V.  Will increase amlodipine to 10 mg, continue home Coreg and losartan.  Closely monitor.   Hypothyroidism, stable Continue home Synthroid  GERD, stable continue home Protonix  Hyperlipidemia, stable continue home Lipitor  Peripheral neuropathy Continue gabapentin  CKD stage III Creatinine stable at baseline.  Avoid nephrotoxins, monitor kidney function.      Telemetry: Yes  DVT prophylaxis: SCDs Consultants: Neurology, oncology    Code Status: DNR  Family Communication: No family at bedside  Disposition Plan: Blood pressure control, PT recommends CIR awaiting evaluation       Subjective Patient has no acute complaints.  No acute events overnight.  Continues to deny any changes in vision or worsening weakness in left upper arm/hand.  Objective: Vitals:   07/23/18 0000 07/23/18 0340 07/23/18 0742 07/23/18 1241  BP: (!) 153/71 (!) 160/84 (!) 167/74 (!) 180/90  Pulse: 85 96 75 92  Resp: 18 20 20 18   Temp: 98 F (36.7 C) 97.7 F (36.5 C) 97.6 F (36.4 C) 98.1 F (36.7 C)  TempSrc: Oral Oral Oral Oral  SpO2: 97% 94% 97% 96%  Weight:      Height:        Intake/Output Summary (Last 24 hours) at 07/23/2018 1606 Last data filed at 07/23/2018 0900 Gross per 24 hour  Intake 2063.68 ml  Output 200 ml  Net 1863.68 ml   Filed Weights   07/21/18 0409 07/22/18 0000 07/22/18 0500  Weight: 56.7 kg 60.2 kg 58.1 kg    Exam:  Constitutional:normal appearing elderly female Eyes: EOMI, bruising surrounding left eye with swelling ENMT: Oropharynx with moist  mucous membranes,  Neck: FROM Cardiovascular: RRR, systolic ejection murmur present, with no peripheral edema Respiratory: Normal respiratory effort on room air, clear breath sounds  Abdomen: Soft,non-tender, normal bowel sounds Skin: No rash ulcers, or lesions. Without skin tenting    Neurologic: Decreased grip strength in left hand otherwise no focal neurologic deficits Psychiatric:Appropriate affect, and mood. Mental status AAOx3  Data Reviewed: CBC: Recent Labs  Lab 07/21/18 0358 07/21/18 0405  WBC 12.7*  --   NEUTROABS 10.0*  --   HGB 12.6 13.3  HCT 39.9 39.0  MCV 92.1  --   PLT 200  --    Basic Metabolic Panel: Recent Labs  Lab 07/21/18 0358 07/21/18 0405 07/23/18 0532  NA 141 139 140  K 3.7 3.8 3.8  CL 105 105 108  CO2 26  --  24  GLUCOSE 130* 128* 132*  BUN 17 20 26*  CREATININE 1.05* 1.00 1.03*  CALCIUM 10.7*  --  8.8*   GFR: Estimated Creatinine Clearance: 37.8 mL/min (A) (by C-G formula based on SCr of 1.03 mg/dL (H)). Liver Function Tests: Recent Labs  Lab 07/21/18 0358 07/23/18 0532  AST 20 21  ALT 21 19  ALKPHOS 61 48  BILITOT 0.8 0.5  PROT 5.9* 5.2*  ALBUMIN 3.6 3.0*   No results for input(s): LIPASE, AMYLASE in the last 168 hours. No results for input(s): AMMONIA in the last 168 hours. Coagulation Profile: Recent Labs  Lab 07/21/18 0358  INR 1.03   Cardiac Enzymes: No results for input(s): CKTOTAL, CKMB, CKMBINDEX, TROPONINI in the last 168 hours. BNP (last 3 results) No results for input(s): PROBNP in the last 8760 hours. HbA1C: No results for input(s): HGBA1C in the last 72 hours. CBG: No results for input(s): GLUCAP in the last 168 hours. Lipid Profile: No results for input(s): CHOL, HDL, LDLCALC, TRIG, CHOLHDL, LDLDIRECT in the last 72 hours. Thyroid Function Tests: No results for input(s): TSH, T4TOTAL, FREET4, T3FREE, THYROIDAB in the last 72 hours. Anemia Panel: No results for input(s): VITAMINB12, FOLATE, FERRITIN, TIBC, IRON, RETICCTPCT in the last 72 hours. Urine analysis:    Component Value Date/Time   COLORURINE STRAW (A) 07/21/2018 0757   APPEARANCEUR CLEAR 07/21/2018 0757   LABSPEC 1.012 07/21/2018 0757   PHURINE 7.0 07/21/2018 0757   GLUCOSEU NEGATIVE 07/21/2018 0757   HGBUR NEGATIVE  07/21/2018 0757   BILIRUBINUR NEGATIVE 07/21/2018 0757   KETONESUR NEGATIVE 07/21/2018 0757   PROTEINUR NEGATIVE 07/21/2018 0757   UROBILINOGEN 0.2 08/19/2008 1026   NITRITE NEGATIVE 07/21/2018 0757   LEUKOCYTESUR NEGATIVE 07/21/2018 0757   Sepsis Labs: @LABRCNTIP (procalcitonin:4,lacticidven:4)  )No results found for this or any previous visit (from the past 240 hour(s)).    Studies: Mr Jeri Cos ZJ Contrast  Result Date: 07/23/2018 CLINICAL DATA:  Recurrent metastatic lung cancer stereotactic radiosurgery planning. EXAM: MRI HEAD WITHOUT AND WITH CONTRAST TECHNIQUE: Multiplanar, multiecho pulse sequences of the brain and surrounding structures were obtained without and with intravenous contrast. CONTRAST:  6 mL Gadavist COMPARISON:  Brain MRI 07/21/2018 FINDINGS: BRAIN: There are multiple contrast-enhancing lesions within the brain, consistent with parenchymal metastases, annotated on series 11: 1. Right cerebellar hemisphere, 2 mm, unchanged, image 56 2. Left cerebellar hemisphere, 14 mm, unchanged, image 62 3. Left temporal lobe, 12 mm, unchanged, image 66. Mild surrounding edema, unchanged. 4. Left pons, 8 mm, unchanged, image 70 5. Medial right temporal lobe, 7 mm, unchanged, image 78 6. Inferior left frontal lobe, 5 mm, unchanged, image 79 7. Right occipital lobe, 3 mm,  not visible on the prior study (probably due to technical factors), image 93 8. Right frontal lobe, 20 mm, unchanged, image 111. Moderate surrounding edema, unchanged. 9. Posterior right parietal lobe, 8 mm, unchanged, image 120 10. Right parietal lobe postcentral sulcus, 15 mm, unchanged, image 140. Large amount of surrounding edema, unchanged. No acute infarct. No midline shift or herniation. Size and configuration of the ventricles are unchanged. VASCULAR: Major intracranial arterial and venous sinus flow voids are normal. SKULL AND UPPER CERVICAL SPINE: The bone marrow signal of the calvarium is normal. There are 2 left  periorbital soft tissue lesions imbedded within the subcutaneous fat. The more inferior lesion measures 14 mm. The more superior lesion measures 13 mm. SINUSES/ORBITS: No fluid levels or advanced mucosal thickening. No mastoid or middle ear effusion. The orbits are normal. IMPRESSION: 1. Unchanged appearance of 9 brain parenchymal metastases with edema greatest in the right parietal and frontal lobes. A 10th metastasis was not visible on the prior study, probably due to differences in slice thickness. 2. Unchanged appearance of 2 lesions within the left periorbital subcutaneous fat. 3. No acute abnormality. Electronically Signed   By: Ulyses Jarred M.D.   On: 07/23/2018 13:52    Scheduled Meds: . acetaminophen  1,000 mg Oral Once  . acetaminophen  650 mg Oral Q6H  . amLODipine  5 mg Oral Daily  . atorvastatin  80 mg Oral Q2200  . calcium-vitamin D  2 tablet Oral Q breakfast  . carvedilol  25 mg Oral BID  . dexamethasone  4 mg Oral Q6H  . etodolac  400 mg Oral BID  . gabapentin  600 mg Oral QHS  . levothyroxine  88 mcg Oral QAC breakfast  . losartan  50 mg Oral BID  . multivitamin with minerals  1 tablet Oral Daily  . pantoprazole  40 mg Oral BID    Continuous Infusions:   LOS: 2 days     Desiree Hane, MD [facility]

## 2018-07-23 NOTE — Progress Notes (Signed)
[facility] notified of 1315 appointment at [facility] tomorrow, 07/24/2018. Will be here at 1245 tomorrow to pick up.

## 2018-07-23 NOTE — Telephone Encounter (Signed)
LM for pt's son to discuss plans for her treatment.

## 2018-07-23 NOTE — Progress Notes (Signed)
Patient would not take 5mg  Norvasc increase as ordered by MD; wants to talk to the MD in the am; education provided; patient states, "I am very sensitive to medication dose changes and wants to wait til am to discuss it with doctor". Continue telemetry and vital sign assessment.

## 2018-07-23 NOTE — Progress Notes (Signed)
Rehab Admissions Coordinator Note:  Patient was screened by Cleatrice Burke for appropriateness for an Inpatient Acute Rehab Consult per PT and OT recs.  At this time, we are recommending Inpatient Rehab consult. Please place order for consult.  Cleatrice Burke RN MSN 07/23/2018, 8:23 AM  I can be reached at [phone].

## 2018-07-23 NOTE — Consult Note (Signed)
Physical Medicine and Rehabilitation Consult Reason for Consult:  Decreased functional mobility Referring Physician: [facility]   HPI: [name] is a 78 y.o.right handed female with history of CKD stage III, hypertension, hyperlipidemia, non-small cell lung cancer with right upper lobe lobectomy 2009 recurrence in 2013 that was treated with radiation and followed by oncology services. Per chart review and patient, patient lives alone independent prior to admission for most tasks with the assistance and children and grandchildren for other tasks. She does have family with good support but they work during the day. Presented 07/21/2018 after a recent fall with left arm weakness. MRI the brain reviewed, showing intra-and extracranial brain masses.  Per report, showed at least 8 parenchymal metastasis, 2 left periorbital subcutaneous lesion with marked surrounding edema likely reflecting subcutaneous metastasis. Oncology service follow-up Dr.Magrinat and await plan for [facility] per neurosurgery Dr. Sherley Bounds as well as discussion with Dr. Julien Nordmann and Dr. Lisbeth Renshaw of radiation oncology for further plan of care.. therapy evaluations completed with recommendations of physical medicine and rehabilitation consult   Review of Systems  Constitutional: Negative for chills and fever.  HENT: Negative for hearing loss.   Eyes: Negative for blurred vision and double vision.  Respiratory: Positive for shortness of breath.   Cardiovascular: Negative for chest pain and palpitations.  Gastrointestinal: Positive for constipation. Negative for nausea.       GERD  Genitourinary: Negative for dysuria, flank pain and hematuria.  Musculoskeletal: Positive for back pain and falls.  Skin: Negative for rash.  Neurological: Positive for focal weakness. Negative for sensory change.  All other systems reviewed and are negative.  Past Medical History:  Diagnosis Date  . Anemia 09/2017  . Arthritis    back.and feet  .  Cervical ca ([place]) dx'd 1988   surg only  . CKD (chronic kidney disease), stage III ([place])   . Complication of anesthesia    difficulty awakening, dysrhythmia post surgery, (prolonged sedation level)  . Coronary artery calcification seen on CT scan   . Dyspnea   . GERD (gastroesophageal reflux disease)   . Headache    occ. sinus type headaches  . History of hiatal hernia   . Hx of radiation therapy 09/16/13-09/30/13   prevascular lymph node  . Hyperlipidemia   . Hypertension   . Hyperthyroidism    dr Loanne Drilling- radioactive iodine treatment 2011- resolved  . Hypothyroidism   . LBBB (left bundle branch block)   . Lung cancer ([place])    Bilaterally, RUL lobectomy and spot involving both lungs- "cancer"  . Macular degeneration    bilateral  . Melanoma ([place])    left leg  . Radiation 10/11/11-10/21/11   Left upper lobe adenocarcinoma  . Tremor    hand- intention tremor   Past Surgical History:  Procedure Laterality Date  . basal cell and squamous cell skin cell area removed     new left lower eye lid done with skin graft   . CARDIAC CATHETERIZATION  09/21/2017  . CATARACT EXTRACTION, BILATERAL Bilateral   . COLONOSCOPY WITH PROPOFOL N/A 10/27/2016   Procedure: COLONOSCOPY WITH PROPOFOL;  Surgeon: Laurence Spates, MD;  Location: [facility];  Service: Endoscopy;  Laterality: N/A;  . conization for cervical dysplasia    . CORONARY STENT INTERVENTION N/A 09/21/2017   Procedure: CORONARY STENT INTERVENTION;  Surgeon: Belva Crome, MD;  Location: [place] [facility];  Service: Cardiovascular;  Laterality: N/A;  . CORONARY/GRAFT ACUTE MI REVASCULARIZATION N/A 09/21/2017  Procedure: Coronary/Graft Acute MI Revascularization;  Surgeon: Belva Crome, MD;  Location: [place] [facility];  Service: Cardiovascular;  Laterality: N/A;  . ESOPHAGOGASTRODUODENOSCOPY (EGD) WITH PROPOFOL N/A 09/28/2017   Procedure: ESOPHAGOGASTRODUODENOSCOPY (EGD) WITH PROPOFOL;  Surgeon: Arta Silence, MD;  Location: [place];  Service: Endoscopy;  Laterality: N/A;  . ESOPHAGOGASTRODUODENOSCOPY (EGD) WITH PROPOFOL N/A 09/30/2017   Procedure: ESOPHAGOGASTRODUODENOSCOPY (EGD) WITH PROPOFOL;  Surgeon: Otis Brace, MD;  Location: [facility];  Service: Gastroenterology;  Laterality: N/A;  . esophogus stretching    . GIVENS CAPSULE STUDY N/A 10/06/2017   Procedure: GIVENS CAPSULE STUDY;  Surgeon: Arta Silence, MD;  Location: [facility];  Service: Endoscopy;  Laterality: N/A;  . INTRAVASCULAR ULTRASOUND/IVUS N/A 09/21/2017   Procedure: Intravascular Ultrasound/IVUS;  Surgeon: Belva Crome, MD;  Location: [place] [facility];  Service: Cardiovascular;  Laterality: N/A;  . IR GENERIC HISTORICAL  09/11/2014   IR RADIOLOGIST EVAL & MGMT 09/11/2014 Jacqulynn Cadet, MD [facility] INTERV RAD  . LEFT HEART CATH AND CORONARY ANGIOGRAPHY N/A 09/21/2017   Procedure: LEFT HEART CATH AND CORONARY ANGIOGRAPHY;  Surgeon: Belva Crome, MD;  Location: [place] [facility];  Service: Cardiovascular;  Laterality: N/A;  . LEFT HEART CATH AND CORONARY ANGIOGRAPHY N/A 09/21/2017   Procedure: LEFT HEART CATH AND CORONARY ANGIOGRAPHY;  Surgeon: Belva Crome, MD;  Location: [place] [facility];  Service: Cardiovascular;  Laterality: N/A;  . radio frequency ablation on lung spot    . rul lobectomy  2010  . TONSILLECTOMY    . TONSILLECTOMY     Family History  Problem Relation Age of Onset  . Asthma Mother   . Heart disease Mother   . Thyroid disease Daughter        hypothyroidism  . Cancer Maternal Uncle        lung  . Cancer Maternal Grandfather        lung  . Diabetes Neg Hx   . Coronary artery disease Neg Hx    Social History:  reports that she quit smoking about 42 years ago. Her smoking use included cigarettes. She has a 17.00 pack-year smoking history. She has never used smokeless tobacco. She reports current alcohol use. She reports that she does not use drugs. Allergies:  Allergies  Allergen Reactions  . Meperidine  Hcl Other (See Comments)    "knocked pt out for 4 days"  . Morphine Other (See Comments)    Stopped breathing due to too high of a dose per patient.  Not an allergic reaction to morphine  . Penicillins Anaphylaxis, Shortness Of Breath and Other (See Comments)    Difficulty breathing  . Oxycodone-Acetaminophen Itching       . Propoxyphene N-Acetaminophen Nausea And Vomiting  . Tramadol Hcl Nausea And Vomiting   Medications Prior to Admission  Medication Sig Dispense Refill  . acetaminophen (TYLENOL) 650 MG CR tablet Take 1,300 mg by mouth 2 (two) times daily.     Marland Kitchen amLODipine (NORVASC) 10 MG tablet Take 0.5 tablets (5 mg total) by mouth daily. 30 tablet 11  . atorvastatin (LIPITOR) 80 MG tablet Take 1 tablet (80 mg total) by mouth daily. 90 tablet 3  . Calcium Carbonate-Vitamin D3 (CALCIUM 600-D) 600-400 MG-UNIT TABS Take 1 tablet by mouth every morning.     . carvedilol (COREG) 25 MG tablet Take 1 tablet (25 mg total) by mouth 2 (two) times daily. 60 tablet 11  . DM-APAP-CPM (CORICIDIN HBP FLU PO) Take 2 tablets by mouth every  6 (six) hours as needed (flu symptoms).    Marland Kitchen etodolac (LODINE) 400 MG tablet Take 400 mg by mouth 2 (two) times daily.  3  . fluticasone (FLONASE) 50 MCG/ACT nasal spray Place 1 spray into both nostrils daily as needed for allergies.     Marland Kitchen gabapentin (NEURONTIN) 300 MG capsule Take 600 mg by mouth at bedtime.   2  . ketotifen (ZADITOR) 0.025 % ophthalmic solution Place 1 drop into the right eye 2 (two) times daily as needed (irritation).    Marland Kitchen levothyroxine (SYNTHROID, LEVOTHROID) 88 MCG tablet Take 88 mcg by mouth daily before breakfast.     . losartan (COZAAR) 50 MG tablet Take 1 tablet (50 mg total) by mouth 2 (two) times daily. 180 tablet 3  . Multiple Vitamin (MULTIVITAMIN WITH MINERALS) TABS tablet Take 1 tablet by mouth daily.    . Multiple Vitamins-Minerals (PRESERVISION AREDS 2 PO) Take 1 capsule by mouth at bedtime.     . nitroGLYCERIN (NITROSTAT) 0.4 MG SL  tablet Place 1 tablet (0.4 mg total) under the tongue every 5 (five) minutes as needed for chest pain.  0  . ondansetron (ZOFRAN ODT) 4 MG disintegrating tablet Take 1 tablet (4 mg total) by mouth every 8 (eight) hours as needed for nausea or vomiting. 10 tablet 0  . pantoprazole (PROTONIX) 40 MG tablet Take 1 tablet (40 mg total) by mouth 2 (two) times daily.    Vladimir Faster Glycol-Propyl Glycol (SYSTANE) 0.4-0.3 % SOLN Apply 1 drop to eye daily as needed.    . promethazine (PHENERGAN) 25 MG tablet Take 25 mg by mouth every 6 (six) hours as needed for nausea or vomiting.      Home: Home Living Family/patient expects to be discharged to:: Private residence Living Arrangements: Alone Available Help at Discharge: Family Type of Home: House Home Access: Stairs to enter Technical brewer of Steps: 1 Entrance Stairs-Rails: Right Home Layout: Two level, Able to live on main level with bedroom/bathroom Bathroom Shower/Tub: Multimedia programmer: Handicapped height Bathroom Accessibility: Yes Home Equipment: Environmental consultant - 2 wheels, Environmental consultant - 4 wheels, Cane - single point, Bedside commode, Shower seat, Wheelchair - manual Additional Comments: Pt lives alone but has family who can stay with her at d/c if needed  Functional History: Prior Function Level of Independence: Independent Functional Status:  Mobility: Bed Mobility Overal bed mobility: Needs Assistance Bed Mobility: Supine to Sit Supine to sit: Min assist, HOB elevated General bed mobility comments: pt up in/back to chair Transfers Overall transfer level: Needs assistance Equipment used: Rolling walker (2 wheeled) Transfers: Sit to/from Stand Sit to Stand: Min assist Stand pivot transfers: Max assist General transfer comment: able to scoot to EOB and use R hemibody to lift to standing, needs assist to bring trunk to neutral and preoccupied by clumsy hand Ambulation/Gait Ambulation/Gait assistance: Max assist Gait  Distance (Feet): 5 Feet Assistive device: Rolling walker (2 wheeled) Gait Pattern/deviations: Step-through pattern, Decreased stride length, Decreased weight shift to left General Gait Details: provided full body support hip to hip with total assist to keep grip on RW, pt able to advance LLE forward and backward with incr effort and time, noting instabilty in knee during wt shift and oversteers with R due to poor LUE control.  Fatigued by end of 5 foward 5 back     ADL: ADL Overall ADL's : Needs assistance/impaired Eating/Feeding: Minimal assistance, Sitting, Set up Grooming: Moderate assistance, Sitting Upper Body Bathing: Moderate assistance, Sitting Lower Body Bathing: Maximal  assistance, +2 for safety/equipment Upper Body Dressing : Moderate assistance, Sitting Lower Body Dressing: Maximal assistance, +2 for safety/equipment, Sit to/from stand Toilet Transfer: Maximal assistance, Stand-pivot, BSC, RW Toilet Transfer Details (indicate cue type and reason): Simulated with EOB>recliner. Therapist providing steadying assist on [place], pt controlling rw with R hand only. Cues for timing of sitting into chair. General ADL Comments: Pt completed bed mobility, sat EOB several minutes, stood 2x from EOB, side stepped left then right, then completed pivotal steps to recliner. Discussed safety and strategies for LUE neglect.  Cognition: Cognition Overall Cognitive Status: Within Functional Limits for tasks assessed Orientation Level: Oriented X4 Cognition Arousal/Alertness: Awake/alert Behavior During Therapy: WFL for tasks assessed/performed Overall Cognitive Status: Within Functional Limits for tasks assessed General Comments: pleasent and engaged  Blood pressure (!) 160/84, pulse 96, temperature 97.7 F (36.5 C), temperature source Oral, resp. rate 20, height 5\' 3"  (1.6 m), weight 58.1 kg, SpO2 94 %. Physical Exam  Vitals reviewed. Constitutional: She is oriented to person, place, and  time. She appears well-developed and well-nourished.  HENT:  Right Ear: External ear normal.  Left Ear: External ear normal.  Bruising around left eye  Eyes: EOM are normal. Right eye exhibits no discharge. Left eye exhibits no discharge.  Pupils reactive to light  Neck: Normal range of motion. Neck supple. No thyromegaly present.  Cardiovascular: Normal rate and regular rhythm.  Respiratory: Effort normal and breath sounds normal. No respiratory distress.  GI: Soft. Bowel sounds are normal. She exhibits distension.  Musculoskeletal:     Comments: No edema or tenderness in extremities  Neurological: She is alert and oriented to person, place, and time.  Follows full commands Motor: Right upper extremity/right lower extremity: 5/5 proximal distal Left upper extremity: 4-/5 with apraxia proximal to distal Left lower extremity: 4-4+/5 proximal to distal  Skin:  See above  Psychiatric: She has a normal mood and affect. Her behavior is normal. Thought content normal.    No results found for this or any previous visit (from the past 24 hour(s)). Mr Jeri Cos Wo Contrast  Result Date: 07/21/2018 CLINICAL DATA:  Non-small cell lung cancer. Metastatic disease. Progression. Abnormal CT head without contrast EXAM: MRI HEAD WITHOUT AND WITH CONTRAST TECHNIQUE: Multiplanar, multiecho pulse sequences of the brain and surrounding structures were obtained without and with intravenous contrast. CONTRAST:  5 mL Gadavist COMPARISON:  CT head without contrast 07/21/2018 FINDINGS: Brain: Multiple peripherally enhancing lesions are present with associated edema. Findings are consistent with metastatic disease to the brain. The lesion in the right parietal lobe measures 14 x 14 x 14 mm on image 14 of series 16. A posterior right parietal lesion measures 8 x 10 x 16 mm on image 40 of series 16. A medial anterior right frontal lobe lesion measures 21 x 17 x 22 mm on image 35 of series 16. A 3 mm right occipital  lesion is best seen on the sagittal images. This is on image 7 of series 18. An anterior inferior left frontal lobe lesion measures 4 x 5 x 5 mm on image 26 of series 16. A left temporal lobe lesion measures 12 x 13 x 11 mm image 23 of series 16 A left anterior pontine lesion measures 8 x 8 x 9 mm on image 23 of series 16. A left cerebellar lesion measures 11 x 12 x 14 mm on image 21 of series 16. Restricted diffusion is present in the anterior right frontal lobe and lateral right parietal lobe  lesions. There is also some restricted diffusion associated with the left temporal lobe lesion. The right frontal and parietal lobe lesions demonstrate the greatest degree of surrounding edema. There is some edema surrounding each of the other lesions as well. No acute infarct is present. Periventricular and subcortical white matter changes are present in addition to the edema surrounding the metastases. Vascular: Flow is present in the major intracranial arteries. Skull and upper cervical spine: The craniocervical junction is normal. Upper cervical spine is within normal limits. Marrow signal is unremarkable. Sinuses/Orbits: The paranasal sinuses and mastoid air cells are clear. Bilateral lens replacements are noted. Globes and orbits are otherwise unremarkable. Other: 2 separate subcutaneous lesions are present lateral and superior to the left orbit. There is extensive surrounding edema. The lesions measure 13 x 15 and 14 x 14 mm respectively. IMPRESSION: 1. At least 8 parenchymal metastases as described above. 2. 2 left periorbital subcutaneous lesions with marked surrounding edema likely reflect subcutaneous metastases. 3. Mild atrophy and white matter disease otherwise likely reflects the sequela of chronic microvascular ischemia. Electronically Signed   By: San Morelle M.D.   On: 07/21/2018 11:18    Assessment/Plan: Diagnosis: Brain metastasis Labs and images (see above) independently reviewed.  Records  reviewed and summated above.  1. Does the need for close, 24 hr/day medical supervision in concert with the patient's rehab needs make it unreasonable for this patient to be served in a less intensive setting? Yes  2. Co-Morbidities requiring supervision/potential complications: CKD stage III (avoid nephrotoxic meds), HTN (monitor and provide prns in accordance with increased physical exertion and pain), hyperlipidemia, non-small cell lung cancer with right upper lobe lobectomy 2009 recurrence in 2013, leukocytosis (repeat labs, cont to monitor for signs and symptoms of infection, further workup if indicated)  3. Due to bladder management, safety, skin/wound care, disease management, pain management and patient education, does the patient require 24 hr/day rehab nursing? Yes 4. Does the patient require coordinated care of a physician, rehab nurse, PT (1-2 hrs/day, 5 days/week) and OT (1-2 hrs/day, 5 days/week) to address physical and functional deficits in the context of the above medical diagnosis(es)? Yes Addressing deficits in the following areas: balance, endurance, locomotion, strength, transferring, bathing, dressing, toileting and psychosocial support 5. Can the patient actively participate in an intensive therapy program of at least 3 hrs of therapy per day at least 5 days per week? Yes 6. The potential for patient to make measurable gains while on inpatient rehab is excellent 7. Anticipated functional outcomes upon discharge from inpatient rehab are min assist  with PT, min assist with OT, n/a with SLP. 8. Estimated rehab length of stay to reach the above functional goals is: 18-23 days. 9. Anticipated D/C setting: Home 10. Anticipated post D/C treatments: HH therapy and Home excercise program 11. Overall Rehab/Functional Prognosis: good  RECOMMENDATIONS: This patient's condition is appropriate for continued rehabilitative care in the following setting: CIR if caregiver support available  upon discharge after completion of medical work-up. Patient has agreed to participate in recommended program. Yes Note that insurance prior authorization may be required for reimbursement for recommended care.  Comment: Rehab Admissions Coordinator to follow up.   I have personally performed a face to face diagnostic evaluation, including, but not limited to relevant history and physical exam findings, of this patient and developed relevant assessment and plan.  Additionally, I have reviewed and concur with the physician assistant's documentation above.   Delice Lesch, MD, ABPMR Lavon Paganini Angiulli, PA-C 07/23/2018

## 2018-07-23 NOTE — Progress Notes (Signed)
Radiation Oncology         [phone]) [phone] ________________________________  Name: [name] MRN: [id number]  Date of Service: 07/23/2018 DOB: [birth year]  Reconsult Note  CC: Wenda Low, MD  No ref. provider found  Diagnosis:  Stage IA cT1a N0, M0, NSCLC, adenocarcinoma  RUL with EGFR mutation,s/p resection with recurrence/[putative early  disease in the LUL.  Interval Since Last Radiation:  5 months 01/30/18-02/16/18 SBRT Treatment: SBRT to  37.5 Gy in 14 fractions to the LUL and hilar node targets  09/16/2013-09/30/2013 SBRT Treatment:  SBRT to a left mediastinal/prevascular lymph node 50 Gy completed over 10 fractions  10/11/2011-10/21/2011 SBRT Treatment: SBRT to a LUL nodule to a total dose of 60 gray in 5 fractions  Narrative:  Ms. Badolato is a pleasant 78 y.o.  female with a remote history of melanoma, and a history of Stage IA cT1a N0, M0 adenocarcinoma who underwent RUL lobectomy with Dr. Arlyce Dice in November 2009, followed by SBRT to a LUL nodule when new disease was noted in 2013. She underwent SBRT to a prevascular node in March 2015, and subsequently began [place] in May 2015, which was ultimately discontinued due to intolerance. She had recurrence in the LUL in 2016 and underwent thermal ablation with Dr. Laurence Ferrari on 10/01/14. She has been followed since and in in June 2019, her tumor in the LUL was 4.1 x 4.8 cm, previously 3.7 x 3.7 cm and this encased the left upper lobe bronchi. Retro hilar LLL nodule was also measured at 2. 6 x 1.7 cm, previously 2.1 x 1.3 cm, and a 1.5 cm nodule in the lingula was noted and starting to incorporate into the dominant left upper lobe mass. 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 She was offered palliative radiotherapy versus palliative chemotherapy plus Keytruda. She elected to proceed with re-irradiation after counseling on the risks. She underwent this treatment in  August 2019, and recent CT imaging showed stability of the treated site in the LUL and in the hilar node location. She did have some mild enlargement over the series of scans she's recently had along the left lower lobe. Her case was discussed in multidisciplinary thoracic oncology conference, and it was recommended to continue her surveillance imaging and to have another comparison study in about 4 months from her December 2019 scan. I spoke with her regarding this, but unfortunately in interval since our discussion, she developed gait instability as well as LUE weakness and fell on the day of her evaluation in the ED. She ha da CT to rule out stroke in the ED on 07/21/2018 that revealed at least 5 foci of mass effect concerning for metastatic disease. She had a large left frontal and periorbital scalp contusion. No CVA was noted. She had an MRI as well that day revealing at least 8 metastatic lesions with the largest in the right frontal lobe measuring 21 x 17 x 22 mm. Edema about these sites were noted and she's been on Dexamethasone 4 mg q 6 hours since. She's seen today to discuss treatment recommendations.  On review of systems, the patient reports that she is doing okay. She doesn't think her grip strength has gotten any better, or control of her left upper extremity. She reports that she is not having any pain, visual or auditory disturbances. She denies any chest pain, shortness of breath, cough, fevers, chills, night sweats, unintended weight changes. She denies any  bowel or bladder disturbances, and denies abdominal pain, nausea or vomiting. She denies any new musculoskeletal or joint aches or pains, new skin lesions or concerns. A complete review of systems is obtained and is otherwise negative.  Past Medical History:  Past Medical History:  Diagnosis Date  . Anemia 09/2017  . Arthritis    back.and feet  . Cervical ca ([place]) dx'd 1988   surg only  . CKD (chronic kidney disease), stage III ([place])    . Complication of anesthesia    difficulty awakening, dysrhythmia post surgery, (prolonged sedation level)  . Coronary artery calcification seen on CT scan   . Dyspnea   . GERD (gastroesophageal reflux disease)   . Headache    occ. sinus type headaches  . History of hiatal hernia   . Hx of radiation therapy 09/16/13-09/30/13   prevascular lymph node  . Hyperlipidemia   . Hypertension   . Hyperthyroidism    dr Loanne Drilling- radioactive iodine treatment 2011- resolved  . Hypothyroidism   . LBBB (left bundle branch block)   . Lung cancer ([place])    Bilaterally, RUL lobectomy and spot involving both lungs- "cancer"  . Macular degeneration    bilateral  . Melanoma ([place])    left leg  . Radiation 10/11/11-10/21/11   Left upper lobe adenocarcinoma  . Tremor    hand- intention tremor    Past Surgical History: Past Surgical History:  Procedure Laterality Date  . basal cell and squamous cell skin cell area removed     new left lower eye lid done with skin graft   . CARDIAC CATHETERIZATION  09/21/2017  . CATARACT EXTRACTION, BILATERAL Bilateral   . COLONOSCOPY WITH PROPOFOL N/A 10/27/2016   Procedure: COLONOSCOPY WITH PROPOFOL;  Surgeon: Laurence Spates, MD;  Location: [facility];  Service: Endoscopy;  Laterality: N/A;  . conization for cervical dysplasia    . CORONARY STENT INTERVENTION N/A 09/21/2017   Procedure: CORONARY STENT INTERVENTION;  Surgeon: Belva Crome, MD;  Location: [place];  Service: Cardiovascular;  Laterality: N/A;  . CORONARY/GRAFT ACUTE MI REVASCULARIZATION N/A 09/21/2017   Procedure: Coronary/Graft Acute MI Revascularization;  Surgeon: Belva Crome, MD;  Location: [place] [facility];  Service: Cardiovascular;  Laterality: N/A;  . ESOPHAGOGASTRODUODENOSCOPY (EGD) WITH PROPOFOL N/A 09/28/2017   Procedure: ESOPHAGOGASTRODUODENOSCOPY (EGD) WITH PROPOFOL;  Surgeon: Arta Silence, MD;  Location: [place];  Service: Endoscopy;  Laterality: N/A;  .  ESOPHAGOGASTRODUODENOSCOPY (EGD) WITH PROPOFOL N/A 09/30/2017   Procedure: ESOPHAGOGASTRODUODENOSCOPY (EGD) WITH PROPOFOL;  Surgeon: Otis Brace, MD;  Location: [facility];  Service: Gastroenterology;  Laterality: N/A;  . esophogus stretching    . GIVENS CAPSULE STUDY N/A 10/06/2017   Procedure: GIVENS CAPSULE STUDY;  Surgeon: Arta Silence, MD;  Location: [facility] ENDOSCOPY;  Service: Endoscopy;  Laterality: N/A;  . INTRAVASCULAR ULTRASOUND/IVUS N/A 09/21/2017   Procedure: Intravascular Ultrasound/IVUS;  Surgeon: Belva Crome, MD;  Location: [place] [facility];  Service: Cardiovascular;  Laterality: N/A;  . IR GENERIC HISTORICAL  09/11/2014   IR RADIOLOGIST EVAL & MGMT 09/11/2014 Jacqulynn Cadet, MD [facility] INTERV RAD  . LEFT HEART CATH AND CORONARY ANGIOGRAPHY N/A 09/21/2017   Procedure: LEFT HEART CATH AND CORONARY ANGIOGRAPHY;  Surgeon: Belva Crome, MD;  Location: [place] [facility];  Service: Cardiovascular;  Laterality: N/A;  . LEFT HEART CATH AND CORONARY ANGIOGRAPHY N/A 09/21/2017   Procedure: LEFT HEART CATH AND CORONARY ANGIOGRAPHY;  Surgeon: Belva Crome, MD;  Location: [place] [facility];  Service:  Cardiovascular;  Laterality: N/A;  . radio frequency ablation on lung spot    . rul lobectomy  2010  . TONSILLECTOMY    . TONSILLECTOMY      Social History:  Social History   Socioeconomic History  . Marital status: Widowed    Spouse name: Not on file  . Number of children: 2  . Years of education: Not on file  . Highest education level: Not on file  Occupational History  . Occupation: homemaker    Employer: RETIRED  Social Needs  . Financial resource strain: Not on file  . Food insecurity:    Worry: Not on file    Inability: Not on file  . Transportation needs:    Medical: Not on file    Non-medical: Not on file  Tobacco Use  . Smoking status: Former Smoker    Packs/day: 1.00    Years: 17.00    Pack years: 17.00    Types: Cigarettes    Last attempt to quit:  09/28/1975    Years since quitting: 42.8  . Smokeless tobacco: Never Used  . Tobacco comment: 33 yrs ago  Substance and Sexual Activity  . Alcohol use: Yes    Comment: glass wine daily  . Drug use: No  . Sexual activity: Not on file  Lifestyle  . Physical activity:    Days per week: Not on file    Minutes per session: Not on file  . Stress: Not on file  Relationships  . Social connections:    Talks on phone: Not on file    Gets together: Not on file    Attends religious service: Not on file    Active member of club or organization: Not on file    Attends meetings of clubs or organizations: Not on file    Relationship status: Widowed  . Intimate partner violence:    Fear of current or ex partner: No    Emotionally abused: No    Physically abused: No    Forced sexual activity: No  Other Topics Concern  . Not on file  Social History Narrative   [employer] graduate   Married [place], Widowed 2019   4 grandchildren and 2 step-grandchildren         Physician Roster:   Oncologist- Dr Julien Nordmann   Surgeon- Dr Arlyce Dice   Gyn- Dr Jodelle Gross [facility]    Family History: Family History  Problem Relation Age of Onset  . Asthma Mother   . Heart disease Mother   . Thyroid disease Daughter        hypothyroidism  . Cancer Maternal Uncle        lung  . Cancer Maternal Grandfather        lung  . Diabetes Neg Hx   . Coronary artery disease Neg Hx      ALLERGIES:  is allergic to meperidine hcl; morphine; penicillins; oxycodone-acetaminophen; propoxyphene n-acetaminophen; and tramadol hcl.  Meds: Current Facility-Administered Medications  Medication Dose Route Frequency Provider Last Rate Last Dose  . 0.9 %  sodium chloride infusion   Intravenous Continuous Oretha Milch D, MD 100 mL/hr at 07/23/18 0014    . acetaminophen (TYLENOL) tablet 1,000 mg  1,000 mg Oral Once Barb Merino, MD      . acetaminophen (TYLENOL) tablet 650 mg  650 mg Oral Q6H Barb Merino, MD   650 mg  at 07/23/18 0535  . amLODipine (NORVASC) tablet 5 mg  5 mg Oral Daily Ghimire, Matthews,  MD   5 mg at 07/22/18 0959  . atorvastatin (LIPITOR) tablet 80 mg  80 mg Oral Q2200 Barb Merino, MD   80 mg at 07/22/18 2123  . calcium-vitamin D (OSCAL WITH D) 500-200 MG-UNIT per tablet 2 tablet  2 tablet Oral Q breakfast Barb Merino, MD   2 tablet at 07/22/18 0817  . carvedilol (COREG) tablet 25 mg  25 mg Oral BID Barb Merino, MD   25 mg at 07/22/18 2122  . dexamethasone (DECADRON) tablet 4 mg  4 mg Oral Q6H Barb Merino, MD   4 mg at 07/23/18 0535  . etodolac (LODINE) tablet 400 mg  400 mg Oral BID Barb Merino, MD   400 mg at 07/22/18 2123  . gabapentin (NEURONTIN) capsule 600 mg  600 mg Oral QHS Barb Merino, MD   600 mg at 07/22/18 2122  . ketotifen (ZADITOR) 0.025 % ophthalmic solution 1 drop  1 drop Right Eye BID PRN Barb Merino, MD      . levothyroxine (SYNTHROID, LEVOTHROID) tablet 88 mcg  88 mcg Oral QAC breakfast Barb Merino, MD   88 mcg at 07/22/18 0501  . losartan (COZAAR) tablet 50 mg  50 mg Oral BID Barb Merino, MD   50 mg at 07/22/18 2124  . morphine (MSIR) tablet 7.5 mg  7.5 mg Oral Q4H PRN Barb Merino, MD   7.5 mg at 07/21/18 0514  . multivitamin with minerals tablet 1 tablet  1 tablet Oral Daily Barb Merino, MD   1 tablet at 07/22/18 0958  . nitroGLYCERIN (NITROSTAT) SL tablet 0.4 mg  0.4 mg Sublingual Q5 Min x 3 PRN Barb Merino, MD      . ondansetron (ZOFRAN-ODT) disintegrating tablet 4 mg  4 mg Oral Q8H PRN Barb Merino, MD      . pantoprazole (PROTONIX) EC tablet 40 mg  40 mg Oral BID Barb Merino, MD   40 mg at 07/22/18 2124  . promethazine (PHENERGAN) tablet 25 mg  25 mg Oral Q6H PRN Barb Merino, MD        Physical Findings:  height is '5\' 3"'  (1.6 m) and weight is 128 lb 1.4 oz (58.1 kg). Her oral temperature is 97.6 F (36.4 C). Her blood pressure is 167/74 (abnormal) and her pulse is 75. Her respiration is 20 and oxygen saturation is 97%.  Pain  Assessment Pain Score: 2 /10 In general this is a well appearing caucasian female in no acute distress. She is alert and oriented x4 and appropriate throughout the examination. HEENT reveals that the patient is normocephalic with an ecchymosis over the left eye. EOMs are intact. Skin is intact without any evidence of gross lesions. No palpable adenopathy is noted of the cervical or supraclavicular nodes bilaterally.  She has 5/5 strength in the upper and lower extremity on the right side, and 3/5 in the LLE, and 2-3/5 in the LUE. Grip strength is minimal on the left, but intact on the right.  Lab Findings: Lab Results  Component Value Date   WBC 12.7 (H) 07/21/2018   HGB 13.3 07/21/2018   HCT 39.0 07/21/2018   MCV 92.1 07/21/2018   PLT 200 07/21/2018     Radiographic Findings: Mr Jeri Cos KW Contrast  Result Date: 07/21/2018 CLINICAL DATA:  Non-small cell lung cancer. Metastatic disease. Progression. Abnormal CT head without contrast EXAM: MRI HEAD WITHOUT AND WITH CONTRAST TECHNIQUE: Multiplanar, multiecho pulse sequences of the brain and surrounding structures were obtained without and with intravenous contrast. CONTRAST:  5  mL Gadavist COMPARISON:  CT head without contrast 07/21/2018 FINDINGS: Brain: Multiple peripherally enhancing lesions are present with associated edema. Findings are consistent with metastatic disease to the brain. The lesion in the right parietal lobe measures 14 x 14 x 14 mm on image 14 of series 16. A posterior right parietal lesion measures 8 x 10 x 16 mm on image 40 of series 16. A medial anterior right frontal lobe lesion measures 21 x 17 x 22 mm on image 35 of series 16. A 3 mm right occipital lesion is best seen on the sagittal images. This is on image 7 of series 18. An anterior inferior left frontal lobe lesion measures 4 x 5 x 5 mm on image 26 of series 16. A left temporal lobe lesion measures 12 x 13 x 11 mm image 23 of series 16 A left anterior pontine lesion  measures 8 x 8 x 9 mm on image 23 of series 16. A left cerebellar lesion measures 11 x 12 x 14 mm on image 21 of series 16. Restricted diffusion is present in the anterior right frontal lobe and lateral right parietal lobe lesions. There is also some restricted diffusion associated with the left temporal lobe lesion. The right frontal and parietal lobe lesions demonstrate the greatest degree of surrounding edema. There is some edema surrounding each of the other lesions as well. No acute infarct is present. Periventricular and subcortical white matter changes are present in addition to the edema surrounding the metastases. Vascular: Flow is present in the major intracranial arteries. Skull and upper cervical spine: The craniocervical junction is normal. Upper cervical spine is within normal limits. Marrow signal is unremarkable. Sinuses/Orbits: The paranasal sinuses and mastoid air cells are clear. Bilateral lens replacements are noted. Globes and orbits are otherwise unremarkable. Other: 2 separate subcutaneous lesions are present lateral and superior to the left orbit. There is extensive surrounding edema. The lesions measure 13 x 15 and 14 x 14 mm respectively. IMPRESSION: 1. At least 8 parenchymal metastases as described above. 2. 2 left periorbital subcutaneous lesions with marked surrounding edema likely reflect subcutaneous metastases. 3. Mild atrophy and white matter disease otherwise likely reflects the sequela of chronic microvascular ischemia. Electronically Signed   By: San Morelle M.D.   On: 07/21/2018 11:18   Ct Head Code Stroke Wo Contrast  Result Date: 07/21/2018 CLINICAL DATA:  Code stroke. 78 y/o F; stroke with left-sided weakness. History of lung cancer. EXAM: CT HEAD WITHOUT CONTRAST TECHNIQUE: Contiguous axial images were obtained from the base of the skull through the vertex without intravenous contrast. COMPARISON:  09/18/2008 CT head. FINDINGS: Brain: At least 5 foci of mass  effect with surrounding vasogenic edema in the right frontal lobe, right frontal parietal junction, right medial temporal lobe, left lateral temporal lobe, and the left cerebellar hemisphere (series 3 image 10, 14, 16, 24) probably representing metastatic disease. No findings of hemorrhage, stroke, extra-axial collection, hydrocephalus, or herniation. Background of mild chronic microvascular ischemic changes and volume loss of the brain. Vascular: Calcific atherosclerosis of carotid siphons. No hyperdense vessel identified. Skull: Normal. Negative for fracture or focal lesion. Large left frontal and periorbital scalp contusion with foci of hemorrhage measuring up to 2 cm. Sinuses/Orbits: No acute finding. Other: Bilateral intra-ocular lens replacement. ASPECTS [facility] Stroke Program Early CT Score) - Ganglionic level infarction (caudate, lentiform nuclei, internal capsule, insula, M1-M3 cortex): 7 - Supraganglionic infarction (M4-M6 cortex): 3 Total score (0-10 with 10 being normal): 10 IMPRESSION: 1. At least  5 foci of mass effect with extensive surrounding edema throughout the brain, likely metastatic disease. MRI of the brain with and without contrast is recommended for further evaluation. 2. Large left frontal and periorbital scalp contusion. No calvarial fracture or acute intracranial traumatic finding. 3. ASPECTS is not applicable. These results were called by telephone at the time of interpretation on 07/21/2018 at 4:10 am to Dr. Leonel Ramsay , who verbally acknowledged these results. Electronically Signed   By: Kristine Garbe M.D.   On: 07/21/2018 04:15    Impression/Plan: 1. Recurrent Metastatic Stage IA cT1a N0, M0, NSCLC, adenocarcinoma  RUL with EGFR mutation,s/p resection with recurrence/putative early disease in the LUL with metastatic disease to the brain. The patient's case has been reviewed by Dr. Lisbeth Renshaw an by multidisciplinary brain conference. She has at least 8 metastatic lesions, and  her right frontal lesion appears to be the most consistent with her LUE and LLE weakness. She should continue on Dexamethasone 4 mg q 6 hours for now with intentions of tapering her course. She is planning to proceed with intense PT in skilled care through [facility]. We will plan on 3T MRI scan which she's just returned from, and I'll let her know the results once it's been read. We would anticipate stereotactic radiosurgery in 1 fraction based on her 1.5T scan. She will simulate tomorrow and have this therapy on 08/01/2018.  We discussed the risks, benefits, short, and long term effects of radiotherapy, and the patient is interested in proceeding. Written consent is obtained and placed in the chart, a copy was provided to the patient. I will also make sure Dr. Julien Nordmann is aware of her current situation.  In a visit lasting 60 minutes, greater than 50% of the time was spent face to face discussing her case, and coordinating the patient's care.     Carola Rhine, PAC

## 2018-07-24 ENCOUNTER — Encounter (HOSPITAL_COMMUNITY): Payer: Self-pay

## 2018-07-24 ENCOUNTER — Ambulatory Visit
Admission: RE | Admit: 2018-07-24 | Discharge: 2018-07-24 | Disposition: A | Discharge status: Home / Self Care | Payer: Medicare Other | Source: Ambulatory Visit | Attending: Internal Medicine | Admitting: Internal Medicine

## 2018-07-24 ENCOUNTER — Inpatient Hospital Stay (HOSPITAL_COMMUNITY)
Admission: RE | Admit: 2018-07-24 | Discharge: 2018-08-02 | DRG: 092 | Disposition: A | Discharge status: Home Health | Payer: Medicare Other | Source: Intra-hospital | Attending: Physical Medicine & Rehabilitation | Admitting: Physical Medicine & Rehabilitation

## 2018-07-24 DIAGNOSIS — S79922A Unspecified injury of left thigh, initial encounter: Secondary | ICD-10-CM | POA: Diagnosis not present

## 2018-07-24 DIAGNOSIS — Z8249 Family history of ischemic heart disease and other diseases of the circulatory system: Secondary | ICD-10-CM

## 2018-07-24 DIAGNOSIS — C7931 Secondary malignant neoplasm of brain: Secondary | ICD-10-CM

## 2018-07-24 DIAGNOSIS — N183 Chronic kidney disease, stage 3 (moderate): Secondary | ICD-10-CM | POA: Diagnosis present

## 2018-07-24 DIAGNOSIS — Z888 Allergy status to other drugs, medicaments and biological substances status: Secondary | ICD-10-CM | POA: Diagnosis not present

## 2018-07-24 DIAGNOSIS — Z85118 Personal history of other malignant neoplasm of bronchus and lung: Secondary | ICD-10-CM | POA: Diagnosis not present

## 2018-07-24 DIAGNOSIS — Z515 Encounter for palliative care: Secondary | ICD-10-CM

## 2018-07-24 DIAGNOSIS — R7989 Other specified abnormal findings of blood chemistry: Secondary | ICD-10-CM | POA: Diagnosis not present

## 2018-07-24 DIAGNOSIS — R531 Weakness: Secondary | ICD-10-CM

## 2018-07-24 DIAGNOSIS — Z85820 Personal history of malignant melanoma of skin: Secondary | ICD-10-CM | POA: Diagnosis not present

## 2018-07-24 DIAGNOSIS — M79652 Pain in left thigh: Secondary | ICD-10-CM | POA: Diagnosis present

## 2018-07-24 DIAGNOSIS — E039 Hypothyroidism, unspecified: Secondary | ICD-10-CM | POA: Diagnosis present

## 2018-07-24 DIAGNOSIS — Z8541 Personal history of malignant neoplasm of cervix uteri: Secondary | ICD-10-CM

## 2018-07-24 DIAGNOSIS — Z801 Family history of malignant neoplasm of trachea, bronchus and lung: Secondary | ICD-10-CM | POA: Diagnosis not present

## 2018-07-24 DIAGNOSIS — Z87891 Personal history of nicotine dependence: Secondary | ICD-10-CM

## 2018-07-24 DIAGNOSIS — Z923 Personal history of irradiation: Secondary | ICD-10-CM

## 2018-07-24 DIAGNOSIS — Z7951 Long term (current) use of inhaled steroids: Secondary | ICD-10-CM

## 2018-07-24 DIAGNOSIS — C3412 Malignant neoplasm of upper lobe, left bronchus or lung: Secondary | ICD-10-CM | POA: Insufficient documentation

## 2018-07-24 DIAGNOSIS — R0602 Shortness of breath: Secondary | ICD-10-CM | POA: Diagnosis not present

## 2018-07-24 DIAGNOSIS — I129 Hypertensive chronic kidney disease with stage 1 through stage 4 chronic kidney disease, or unspecified chronic kidney disease: Secondary | ICD-10-CM | POA: Diagnosis present

## 2018-07-24 DIAGNOSIS — Z79890 Hormone replacement therapy (postmenopausal): Secondary | ICD-10-CM | POA: Diagnosis not present

## 2018-07-24 DIAGNOSIS — K219 Gastro-esophageal reflux disease without esophagitis: Secondary | ICD-10-CM | POA: Diagnosis present

## 2018-07-24 DIAGNOSIS — M79609 Pain in unspecified limb: Secondary | ICD-10-CM | POA: Diagnosis not present

## 2018-07-24 DIAGNOSIS — E785 Hyperlipidemia, unspecified: Secondary | ICD-10-CM | POA: Diagnosis present

## 2018-07-24 DIAGNOSIS — Z885 Allergy status to narcotic agent status: Secondary | ICD-10-CM

## 2018-07-24 DIAGNOSIS — M792 Neuralgia and neuritis, unspecified: Secondary | ICD-10-CM

## 2018-07-24 DIAGNOSIS — Z880 Allergy status to penicillin: Secondary | ICD-10-CM

## 2018-07-24 DIAGNOSIS — R2689 Other abnormalities of gait and mobility: Secondary | ICD-10-CM | POA: Diagnosis present

## 2018-07-24 DIAGNOSIS — Z902 Acquired absence of lung [part of]: Secondary | ICD-10-CM | POA: Diagnosis not present

## 2018-07-24 DIAGNOSIS — R0600 Dyspnea, unspecified: Secondary | ICD-10-CM

## 2018-07-24 DIAGNOSIS — R29898 Other symptoms and signs involving the musculoskeletal system: Secondary | ICD-10-CM

## 2018-07-24 DIAGNOSIS — G8194 Hemiplegia, unspecified affecting left nondominant side: Secondary | ICD-10-CM | POA: Diagnosis present

## 2018-07-24 DIAGNOSIS — Z66 Do not resuscitate: Secondary | ICD-10-CM | POA: Diagnosis present

## 2018-07-24 DIAGNOSIS — I10 Essential (primary) hypertension: Secondary | ICD-10-CM

## 2018-07-24 DIAGNOSIS — Z79899 Other long term (current) drug therapy: Secondary | ICD-10-CM | POA: Diagnosis not present

## 2018-07-24 DIAGNOSIS — Z23 Encounter for immunization: Secondary | ICD-10-CM

## 2018-07-24 LAB — COMPREHENSIVE METABOLIC PANEL
ALT: 25 U/L (ref 0–44)
AST: 22 U/L (ref 15–41)
Albumin: 3.1 g/dL — ABNORMAL LOW (ref 3.5–5.0)
Alkaline Phosphatase: 51 U/L (ref 38–126)
Anion gap: 9 (ref 5–15)
BUN: 27 mg/dL — ABNORMAL HIGH (ref 8–23)
CO2: 24 mmol/L (ref 22–32)
Calcium: 9.3 mg/dL (ref 8.9–10.3)
Chloride: 107 mmol/L (ref 98–111)
Creatinine, Ser: 0.89 mg/dL (ref 0.44–1.00)
GFR calc Af Amer: >60 mL/min (ref >60)
GFR calc non Af Amer: >60 mL/min (ref >60)
Glucose, Bld: 129 mg/dL — ABNORMAL HIGH (ref 70–99)
Potassium: 3.8 mmol/L (ref 3.5–5.1)
Sodium: 140 mmol/L (ref 135–145)
Total Bilirubin: 1 mg/dL (ref 0.3–1.2)
Total Protein: 5.4 g/dL — ABNORMAL LOW (ref 6.5–8.1)

## 2018-07-24 MED ORDER — PANTOPRAZOLE SODIUM 40 MG PO TBEC: 40.0000 mg | DELAYED_RELEASE_TABLET | Freq: Two times a day (BID) | ORAL | Status: DC

## 2018-07-24 MED ORDER — GABAPENTIN 300 MG PO CAPS
600.0000 mg | ORAL_CAPSULE | Freq: Every day | ORAL | Status: DC
  Administered 2018-07-24 – 2018-08-01 (×9): 600 mg via ORAL
  Filled 2018-07-24 (×9): qty 2

## 2018-07-24 MED ORDER — CARVEDILOL 25 MG PO TABS
25.0000 mg | ORAL_TABLET | Freq: Two times a day (BID) | ORAL | Status: DC
  Administered 2018-07-24 – 2018-08-02 (×18): 25 mg via ORAL
  Filled 2018-07-24 (×18): qty 1

## 2018-07-24 MED ORDER — ATORVASTATIN CALCIUM 80 MG PO TABS: 80.0000 mg | ORAL_TABLET | Freq: Every day | ORAL | 3 refills | Status: DC

## 2018-07-24 MED ORDER — LEVOTHYROXINE SODIUM 88 MCG PO TABS: 88.0000 ug | ORAL_TABLET | Freq: Every day | ORAL | Status: DC

## 2018-07-24 MED ORDER — CALCIUM CARBONATE-VITAMIN D3 600-400 MG-UNIT PO TABS: 1.0000 | ORAL_TABLET | ORAL | Status: DC

## 2018-07-24 MED ORDER — ADULT MULTIVITAMIN W/MINERALS CH: 1.0000 | ORAL_TABLET | Freq: Every day | ORAL | Status: AC

## 2018-07-24 MED ORDER — NITROGLYCERIN 0.4 MG SL SUBL: 0.4000 mg | SUBLINGUAL_TABLET | SUBLINGUAL | Status: DC | PRN

## 2018-07-24 MED ORDER — POLYETHYL GLYCOL-PROPYL GLYCOL 0.4-0.3 % OP SOLN: 1.0000 [drp] | Freq: Every day | OPHTHALMIC | 0 refills | Status: DC | PRN

## 2018-07-24 MED ORDER — PANTOPRAZOLE SODIUM 40 MG PO TBEC
40.0000 mg | DELAYED_RELEASE_TABLET | Freq: Two times a day (BID) | ORAL | Status: DC
  Administered 2018-07-24 – 2018-08-02 (×18): 40 mg via ORAL
  Filled 2018-07-24 (×18): qty 1

## 2018-07-24 MED ORDER — AMLODIPINE BESYLATE 10 MG PO TABS
10.0000 mg | ORAL_TABLET | Freq: Every day | ORAL | Status: DC
  Administered 2018-07-25 – 2018-08-02 (×9): 10 mg via ORAL
  Filled 2018-07-24 (×9): qty 1

## 2018-07-24 MED ORDER — ACETAMINOPHEN 325 MG PO TABS
650.0000 mg | ORAL_TABLET | Freq: Four times a day (QID) | ORAL | Status: DC
  Administered 2018-07-24 – 2018-08-02 (×34): 650 mg via ORAL
  Filled 2018-07-24 (×35): qty 2

## 2018-07-24 MED ORDER — AMLODIPINE BESYLATE 10 MG PO TABS: 10.0000 mg | ORAL_TABLET | Freq: Every day | ORAL | 11 refills | Status: DC

## 2018-07-24 MED ORDER — DEXAMETHASONE 4 MG PO TABS: 4.0000 mg | ORAL_TABLET | Freq: Four times a day (QID) | ORAL | Status: DC

## 2018-07-24 MED ORDER — CALCIUM CARBONATE-VITAMIN D 500-200 MG-UNIT PO TABS
2.0000 | ORAL_TABLET | Freq: Every day | ORAL | Status: DC
  Administered 2018-07-25 – 2018-08-02 (×9): 2 via ORAL
  Filled 2018-07-24 (×9): qty 2

## 2018-07-24 MED ORDER — ATORVASTATIN CALCIUM 80 MG PO TABS
80.0000 mg | ORAL_TABLET | Freq: Every day | ORAL | Status: DC
  Administered 2018-07-24 – 2018-08-01 (×9): 80 mg via ORAL
  Filled 2018-07-24 (×9): qty 1

## 2018-07-24 MED ORDER — PROMETHAZINE HCL 25 MG PO TABS: 25.0000 mg | ORAL_TABLET | Freq: Four times a day (QID) | ORAL | 0 refills | Status: DC | PRN

## 2018-07-24 MED ORDER — ETODOLAC 400 MG PO TABS
400.0000 mg | ORAL_TABLET | Freq: Two times a day (BID) | ORAL | Status: DC
  Administered 2018-07-24 – 2018-08-02 (×18): 400 mg via ORAL
  Filled 2018-07-24 (×18): qty 1

## 2018-07-24 MED ORDER — GABAPENTIN 300 MG PO CAPS: 600.0000 mg | ORAL_CAPSULE | Freq: Every day | ORAL | 2 refills | Status: DC

## 2018-07-24 MED ORDER — ONDANSETRON 4 MG PO TBDP: 4.0000 mg | ORAL_TABLET | Freq: Three times a day (TID) | ORAL | 0 refills | Status: DC | PRN

## 2018-07-24 MED ORDER — FLUTICASONE PROPIONATE 50 MCG/ACT NA SUSP: 1.0000 | Freq: Every day | NASAL | 2 refills | Status: AC | PRN

## 2018-07-24 MED ORDER — KETOTIFEN FUMARATE 0.025 % OP SOLN: 1.0000 [drp] | Freq: Two times a day (BID) | OPHTHALMIC | 0 refills | Status: AC | PRN

## 2018-07-24 MED ORDER — CARVEDILOL 25 MG PO TABS: 25.0000 mg | ORAL_TABLET | Freq: Two times a day (BID) | ORAL | 11 refills | Status: DC

## 2018-07-24 MED ORDER — KETOTIFEN FUMARATE 0.025 % OP SOLN: 1.0000 [drp] | Freq: Two times a day (BID) | OPHTHALMIC | Status: DC | PRN

## 2018-07-24 MED ORDER — ADULT MULTIVITAMIN W/MINERALS CH
1.0000 | ORAL_TABLET | Freq: Every day | ORAL | Status: DC
  Administered 2018-07-25 – 2018-08-02 (×9): 1 via ORAL
  Filled 2018-07-24 (×9): qty 1

## 2018-07-24 MED ORDER — ACETAMINOPHEN ER 650 MG PO TBCR: 1300.0000 mg | EXTENDED_RELEASE_TABLET | Freq: Two times a day (BID) | ORAL | Status: DC

## 2018-07-24 MED ORDER — MORPHINE SULFATE 15 MG PO TABS: 7.5000 mg | ORAL_TABLET | ORAL | 0 refills | Status: DC | PRN

## 2018-07-24 MED ORDER — MORPHINE SULFATE 15 MG PO TABS: 7.5000 mg | ORAL_TABLET | ORAL | Status: DC | PRN

## 2018-07-24 MED ORDER — SORBITOL 70 % SOLN
30.0000 mL | Freq: Every day | Status: DC | PRN
  Administered 2018-07-27: 30 mL via ORAL
  Filled 2018-07-24: qty 30

## 2018-07-24 MED ORDER — LOSARTAN POTASSIUM 50 MG PO TABS: 50.0000 mg | ORAL_TABLET | Freq: Two times a day (BID) | ORAL | 3 refills | Status: DC

## 2018-07-24 MED ORDER — ONDANSETRON 4 MG PO TBDP
4.0000 mg | ORAL_TABLET | Freq: Three times a day (TID) | ORAL | Status: DC | PRN
  Administered 2018-07-25 – 2018-07-26 (×2): 4 mg via ORAL
  Filled 2018-07-24 (×3): qty 1

## 2018-07-24 MED ORDER — LOSARTAN POTASSIUM 50 MG PO TABS
50.0000 mg | ORAL_TABLET | Freq: Two times a day (BID) | ORAL | Status: DC
  Administered 2018-07-24 – 2018-08-02 (×18): 50 mg via ORAL
  Filled 2018-07-24 (×18): qty 1

## 2018-07-24 MED ORDER — LEVOTHYROXINE SODIUM 88 MCG PO TABS
88.0000 ug | ORAL_TABLET | Freq: Every day | ORAL | Status: DC
  Administered 2018-07-25 – 2018-08-02 (×9): 88 ug via ORAL
  Filled 2018-07-24 (×9): qty 1

## 2018-07-24 MED ORDER — ETODOLAC 400 MG PO TABS: 400.0000 mg | ORAL_TABLET | Freq: Two times a day (BID) | ORAL | 3 refills | Status: DC

## 2018-07-24 MED ORDER — DEXAMETHASONE 4 MG PO TABS
4.0000 mg | ORAL_TABLET | Freq: Four times a day (QID) | ORAL | Status: DC
  Administered 2018-07-24 – 2018-07-26 (×8): 4 mg via ORAL
  Filled 2018-07-24 (×9): qty 1

## 2018-07-24 MED ORDER — NITROGLYCERIN 0.4 MG SL SUBL: 0.4000 mg | SUBLINGUAL_TABLET | SUBLINGUAL | 0 refills | Status: AC | PRN

## 2018-07-24 NOTE — Consult Note (Signed)
            [facility] CM Primary Care Navigator  07/24/2018  [name] [birth year] [id number]   Went to see patient at the bedside to identify possible discharge needs but sheis out of facility to [facility] for a procedure in preparation for radiation per RN report.  According to RN, patient will be possibly transferred to [place] ([facility]) as well.  Per MD note, patient presented with left-sided weakness, with predominant loss of strength in left hand since around Christmas. Shelost her balance and fell with inability to get up on her own. CT of head was notable for multifocal intracranial metastasis with concern for recurrence of her non-small cell lung cancer. Palliative care has also been consulted for goals of care discussion and symptom management.  Primary care provider's office is listed as providing transition of care (TOC) follow-up.   For additional questions please contact:  Edwena Felty A. Pasco Marchitto, BSN, RN-BC [facility] PRIMARY CARE Navigator Cell: [phone]

## 2018-07-24 NOTE — H&P (Signed)
Physical Medicine and Rehabilitation Admission H&P        Chief Complaint  Patient presents with  . Code Stroke  : HPI: [name] is a 78 year old right-handed female with history of chronic renal insufficiency stage III, hypertension, hyperlipidemia, non-small cell lung cancer with right upper lobe lobectomy 2009 recurrence in 2013 that was treated with radiation therapy and followed by oncology services. Per chart review and patient, patient lives alone independent prior to admission for most tasks with assistance of children and grandchildren for other tasks. She does have family in good support. Presented 07/21/2018 after recent fall with left arm weakness. MRI the brain reviewed, showing intra-and extracranial brain masses. Per report, showed at least 8 parenchymal metastasis, to left periorbital subcutaneous brain lesions with marked surrounding edema likely reflecting subcutaneous metastasis. Oncology services follow-up Dr. Jana Hakim with plan for [facility] radiation therapy which was also discussed with Dr. Julien Nordmann and Dr. Lisbeth Renshaw of radiation oncology. Therapy evaluations completed with recommendations of physical medicine rehabilitation consult. Patient was admitted for a comprehensive rehabilitation program.   Review of Systems  Constitutional: Negative for chills and fever.  HENT: Negative for hearing loss.   Eyes: Negative for blurred vision and double vision.  Respiratory: Positive for shortness of breath.   Cardiovascular: Negative for chest pain, palpitations and leg swelling.  Gastrointestinal: Positive for constipation. Negative for nausea and vomiting.       GERD  Genitourinary: Negative for dysuria, flank pain and hematuria.  Musculoskeletal: Positive for back pain and falls.  Skin: Negative for rash.  Neurological: Positive for focal weakness.  All other systems reviewed and are negative.   Past Medical History:  Diagnosis Date  . Anemia 09/2017  . Arthritis     back.and feet  . Cervical ca ([place]) dx'd 1988    surg only  . CKD (chronic kidney disease), stage III ([place])    . Complication of anesthesia      difficulty awakening, dysrhythmia post surgery, (prolonged sedation level)  . Coronary artery calcification seen on CT scan    . Dyspnea    . GERD (gastroesophageal reflux disease)    . Headache      occ. sinus type headaches  . History of hiatal hernia    . Hx of radiation therapy 09/16/13-09/30/13    prevascular lymph node  . Hyperlipidemia    . Hypertension    . Hyperthyroidism      dr Loanne Drilling- radioactive iodine treatment 2011- resolved  . Hypothyroidism    . LBBB (left bundle branch block)    . Lung cancer ([place])      Bilaterally, RUL lobectomy and spot involving both lungs- "cancer"  . Macular degeneration      bilateral  . Melanoma ([place])      left leg  . Radiation 10/11/11-10/21/11    Left upper lobe adenocarcinoma  . Tremor      hand- intention tremor         Past Surgical History:  Procedure Laterality Date  . basal cell and squamous cell skin cell area removed        new left lower eye lid done with skin graft   . CARDIAC CATHETERIZATION   09/21/2017  . CATARACT EXTRACTION, BILATERAL Bilateral    . COLONOSCOPY WITH PROPOFOL N/A 10/27/2016    Procedure: COLONOSCOPY WITH PROPOFOL;  Surgeon: Laurence Spates, MD;  Location: [facility];  Service: Endoscopy;  Laterality: N/A;  . conization for cervical dysplasia      .  CORONARY STENT INTERVENTION N/A 09/21/2017    Procedure: CORONARY STENT INTERVENTION;  Surgeon: Belva Crome, MD;  Location: [place] [facility];  Service: Cardiovascular;  Laterality: N/A;  . CORONARY/GRAFT ACUTE MI REVASCULARIZATION N/A 09/21/2017    Procedure: Coronary/Graft Acute MI Revascularization;  Surgeon: Belva Crome, MD;  Location: [place] [facility];  Service: Cardiovascular;  Laterality: N/A;  . ESOPHAGOGASTRODUODENOSCOPY (EGD) WITH PROPOFOL N/A 09/28/2017    Procedure: ESOPHAGOGASTRODUODENOSCOPY (EGD)  WITH PROPOFOL;  Surgeon: Arta Silence, MD;  Location: [place];  Service: Endoscopy;  Laterality: N/A;  . ESOPHAGOGASTRODUODENOSCOPY (EGD) WITH PROPOFOL N/A 09/30/2017    Procedure: ESOPHAGOGASTRODUODENOSCOPY (EGD) WITH PROPOFOL;  Surgeon: Otis Brace, MD;  Location: [facility];  Service: Gastroenterology;  Laterality: N/A;  . esophogus stretching      . GIVENS CAPSULE STUDY N/A 10/06/2017    Procedure: GIVENS CAPSULE STUDY;  Surgeon: Arta Silence, MD;  Location: [facility] ENDOSCOPY;  Service: Endoscopy;  Laterality: N/A;  . INTRAVASCULAR ULTRASOUND/IVUS N/A 09/21/2017    Procedure: Intravascular Ultrasound/IVUS;  Surgeon: Belva Crome, MD;  Location: [place] [facility];  Service: Cardiovascular;  Laterality: N/A;  . IR GENERIC HISTORICAL   09/11/2014    IR RADIOLOGIST EVAL & MGMT 09/11/2014 Jacqulynn Cadet, MD [facility] INTERV RAD  . LEFT HEART CATH AND CORONARY ANGIOGRAPHY N/A 09/21/2017    Procedure: LEFT HEART CATH AND CORONARY ANGIOGRAPHY;  Surgeon: Belva Crome, MD;  Location: [place] [facility];  Service: Cardiovascular;  Laterality: N/A;  . LEFT HEART CATH AND CORONARY ANGIOGRAPHY N/A 09/21/2017    Procedure: LEFT HEART CATH AND CORONARY ANGIOGRAPHY;  Surgeon: Belva Crome, MD;  Location: [place] [facility];  Service: Cardiovascular;  Laterality: N/A;  . radio frequency ablation on lung spot      . rul lobectomy   2010  . TONSILLECTOMY      . TONSILLECTOMY             Family History  Problem Relation Age of Onset  . Asthma Mother    . Heart disease Mother    . Thyroid disease Daughter          hypothyroidism  . Cancer Maternal Uncle          lung  . Cancer Maternal Grandfather          lung  . Diabetes Neg Hx    . Coronary artery disease Neg Hx      Social History:  reports that she quit smoking about 42 years ago. Her smoking use included cigarettes. She has a 17.00 pack-year smoking history. She has never used smokeless tobacco. She reports current alcohol use. She  reports that she does not use drugs. Allergies:       Allergies  Allergen Reactions  . Meperidine Hcl Other (See Comments)      "knocked pt out for 4 days"  . Morphine Other (See Comments)      Stopped breathing due to too high of a dose per patient.  Not an allergic reaction to morphine  . Penicillins Anaphylaxis, Shortness Of Breath and Other (See Comments)      Difficulty breathing  . Oxycodone-Acetaminophen Itching           . Propoxyphene N-Acetaminophen Nausea And Vomiting  . Tramadol Hcl Nausea And Vomiting          Medications Prior to Admission  Medication Sig Dispense Refill  . acetaminophen (TYLENOL) 650 MG CR tablet Take 1,300 mg by mouth 2 (two) times  daily.       . amLODipine (NORVASC) 10 MG tablet Take 0.5 tablets (5 mg total) by mouth daily. 30 tablet 11  . atorvastatin (LIPITOR) 80 MG tablet Take 1 tablet (80 mg total) by mouth daily. 90 tablet 3  . Calcium Carbonate-Vitamin D3 (CALCIUM 600-D) 600-400 MG-UNIT TABS Take 1 tablet by mouth every morning.       . carvedilol (COREG) 25 MG tablet Take 1 tablet (25 mg total) by mouth 2 (two) times daily. 60 tablet 11  . DM-APAP-CPM (CORICIDIN HBP FLU PO) Take 2 tablets by mouth every 6 (six) hours as needed (flu symptoms).      Marland Kitchen etodolac (LODINE) 400 MG tablet Take 400 mg by mouth 2 (two) times daily.   3  . fluticasone (FLONASE) 50 MCG/ACT nasal spray Place 1 spray into both nostrils daily as needed for allergies.       Marland Kitchen gabapentin (NEURONTIN) 300 MG capsule Take 600 mg by mouth at bedtime.    2  . ketotifen (ZADITOR) 0.025 % ophthalmic solution Place 1 drop into the right eye 2 (two) times daily as needed (irritation).      Marland Kitchen levothyroxine (SYNTHROID, LEVOTHROID) 88 MCG tablet Take 88 mcg by mouth daily before breakfast.       . losartan (COZAAR) 50 MG tablet Take 1 tablet (50 mg total) by mouth 2 (two) times daily. 180 tablet 3  . Multiple Vitamin (MULTIVITAMIN WITH MINERALS) TABS tablet Take 1 tablet by mouth daily.       . Multiple Vitamins-Minerals (PRESERVISION AREDS 2 PO) Take 1 capsule by mouth at bedtime.       . nitroGLYCERIN (NITROSTAT) 0.4 MG SL tablet Place 1 tablet (0.4 mg total) under the tongue every 5 (five) minutes as needed for chest pain.   0  . ondansetron (ZOFRAN ODT) 4 MG disintegrating tablet Take 1 tablet (4 mg total) by mouth every 8 (eight) hours as needed for nausea or vomiting. 10 tablet 0  . pantoprazole (PROTONIX) 40 MG tablet Take 1 tablet (40 mg total) by mouth 2 (two) times daily.      Vladimir Faster Glycol-Propyl Glycol (SYSTANE) 0.4-0.3 % SOLN Apply 1 drop to eye daily as needed.      . promethazine (PHENERGAN) 25 MG tablet Take 25 mg by mouth every 6 (six) hours as needed for nausea or vomiting.          Drug Regimen Review Drug regimen was reviewed and remains appropriate with no significant issues identified   Home: Home Living Family/patient expects to be discharged to:: Private residence Living Arrangements: Alone Available Help at Discharge: Family Type of Home: House Home Access: Stairs to enter Technical brewer of Steps: 1 Entrance Stairs-Rails: Right Home Layout: Two level, Able to live on main level with bedroom/bathroom Bathroom Shower/Tub: Multimedia programmer: Handicapped height Bathroom Accessibility: Yes Home Equipment: Environmental consultant - 2 wheels, Walker - 4 wheels, Cane - single point, Bedside commode, Shower seat, Wheelchair - manual Additional Comments: Pt lives alone but has family who can stay with her at d/c if needed   Functional History: Prior Function Level of Independence: Independent   Functional Status:  Mobility: Bed Mobility Overal bed mobility: Needs Assistance Bed Mobility: Rolling, Sidelying to Sit Rolling: Supervision Sidelying to sit: Supervision Supine to sit: Min assist, HOB elevated General bed mobility comments: cues for technique Transfers Overall transfer level: Needs assistance Equipment used: Rolling walker (2  wheeled) Transfers: Sit to/from Stand Sit to Stand: [employer]  assist, +2 safety/equipment Stand pivot transfers: Max assist General transfer comment: assist for safety, cues for hand placement Ambulation/Gait Ambulation/Gait assistance: Mod assist, +2 safety/equipment(chair follow) Gait Distance (Feet): 40 Feet Assistive device: Rolling walker (2 wheeled), 1 person hand held assist Gait Pattern/deviations: Decreased step length - left, Decreased weight shift to right, Wide base of support General Gait Details: assist with walker to keep L hand on walker, then provided R HHA without walker and continued to walk, though pt more fearful of falling.  Assist for R lateral weight shift to improve L foot clearance   ADL: ADL Overall ADL's : Needs assistance/impaired Eating/Feeding: Minimal assistance, Sitting, Set up Grooming: Moderate assistance, Sitting Upper Body Bathing: Moderate assistance, Sitting Lower Body Bathing: Maximal assistance, +2 for safety/equipment Upper Body Dressing : Moderate assistance, Sitting Lower Body Dressing: Maximal assistance, +2 for safety/equipment, Sit to/from stand Toilet Transfer: Maximal assistance, Stand-pivot, BSC, RW Toilet Transfer Details (indicate cue type and reason): Simulated with EOB>recliner. Therapist providing steadying assist on [place], pt controlling rw with R hand only. Cues for timing of sitting into chair. General ADL Comments: Pt completed bed mobility, sat EOB several minutes, stood 2x from EOB, side stepped left then right, then completed pivotal steps to recliner. Discussed safety and strategies for LUE neglect.   Cognition: Cognition Overall Cognitive Status: Within Functional Limits for tasks assessed Orientation Level: Oriented X4 Cognition Arousal/Alertness: Awake/alert Behavior During Therapy: WFL for tasks assessed/performed Overall Cognitive Status: Within Functional Limits for tasks assessed General Comments: pleasent and engaged     Physical Exam: Blood pressure (!) 156/80, pulse 77, temperature 97.8 F (36.6 C), temperature source Oral, resp. rate 20, height 5\' 3"  (1.6 m), weight 58.1 kg, SpO2 99 %. Physical Exam  Constitutional: She appears well-developed. No distress.  HENT:  Bandage on nose. Bruising/swelling around left eye  Eyes: Pupils are equal, round, and reactive to light. Conjunctivae are normal.  Neck: Normal range of motion. No thyromegaly present.  Cardiovascular: Normal rate and regular rhythm. Exam reveals no friction rub.  No murmur heard. Respiratory: Effort normal. No respiratory distress. She has no wheezes. She has no rales.  GI: Soft. She exhibits no distension. There is no abdominal tenderness.  Musculoskeletal: Normal range of motion.  Neurological:  Patient is alert. Makes good eye contact with examiner. Follows commands. Fair awareness of deficits. LUE 2-3/5 prox to distal with decreased FMC/ataxia. LLE 4/5 with sl decrease in [facility]. RUE and RLE 4 to 4+/5. No gross sensory findings.   Skin:  Incision on medial left ankle from prior surgery  Psychiatric: Her behavior is normal. Thought content normal.      Lab Results Last 48 Hours        Results for orders placed or performed during the hospital encounter of 07/21/18 (from the past 48 hour(s))  Comprehensive metabolic panel     Status: Abnormal    Collection Time: 07/23/18  5:32 AM  Result Value Ref Range    Sodium 140 135 - 145 mmol/L    Potassium 3.8 3.5 - 5.1 mmol/L    Chloride 108 98 - 111 mmol/L    CO2 24 22 - 32 mmol/L    Glucose, Bld 132 (H) 70 - 99 mg/dL    BUN 26 (H) 8 - 23 mg/dL    Creatinine, Ser 1.03 (H) 0.44 - 1.00 mg/dL    Calcium 8.8 (L) 8.9 - 10.3 mg/dL    Total Protein 5.2 (L) 6.5 - 8.1 g/dL    Albumin 3.0 (  L) 3.5 - 5.0 g/dL    AST 21 15 - 41 U/L    ALT 19 0 - 44 U/L    Alkaline Phosphatase 48 38 - 126 U/L    Total Bilirubin 0.5 0.3 - 1.2 mg/dL    GFR calc non Af Amer 52 (L) >60 mL/min    GFR calc Af Amer >60  >60 mL/min    Anion gap 8 5 - 15      Comment: Performed at [facility] [address], [place], [place] [address]  Comprehensive metabolic panel     Status: Abnormal    Collection Time: 07/24/18  5:05 AM  Result Value Ref Range    Sodium 140 135 - 145 mmol/L    Potassium 3.8 3.5 - 5.1 mmol/L    Chloride 107 98 - 111 mmol/L    CO2 24 22 - 32 mmol/L    Glucose, Bld 129 (H) 70 - 99 mg/dL    BUN 27 (H) 8 - 23 mg/dL    Creatinine, Ser 0.89 0.44 - 1.00 mg/dL    Calcium 9.3 8.9 - 10.3 mg/dL    Total Protein 5.4 (L) 6.5 - 8.1 g/dL    Albumin 3.1 (L) 3.5 - 5.0 g/dL    AST 22 15 - 41 U/L    ALT 25 0 - 44 U/L    Alkaline Phosphatase 51 38 - 126 U/L    Total Bilirubin 1.0 0.3 - 1.2 mg/dL    GFR calc non Af Amer >60 >60 mL/min    GFR calc Af Amer >60 >60 mL/min    Anion gap 9 5 - 15      Comment: Performed at [facility] [address], [place], [place] [address]       Imaging Results (Last 48 hours)  Mr Jeri Cos Wo Contrast   Result Date: 07/23/2018 CLINICAL DATA:  Recurrent metastatic lung cancer stereotactic radiosurgery planning. EXAM: MRI HEAD WITHOUT AND WITH CONTRAST TECHNIQUE: Multiplanar, multiecho pulse sequences of the brain and surrounding structures were obtained without and with intravenous contrast. CONTRAST:  6 mL Gadavist COMPARISON:  Brain MRI 07/21/2018 FINDINGS: BRAIN: There are multiple contrast-enhancing lesions within the brain, consistent with parenchymal metastases, annotated on series 11: 1. Right cerebellar hemisphere, 2 mm, unchanged, image 56 2. Left cerebellar hemisphere, 14 mm, unchanged, image 62 3. Left temporal lobe, 12 mm, unchanged, image 66. Mild surrounding edema, unchanged. 4. Left pons, 8 mm, unchanged, image 70 5. Medial right temporal lobe, 7 mm, unchanged, image 78 6. Inferior left frontal lobe, 5 mm, unchanged, image 79 7. Right occipital lobe, 3 mm, not visible on the prior study (probably due to technical factors), image 93 8. Right  frontal lobe, 20 mm, unchanged, image 111. Moderate surrounding edema, unchanged. 9. Posterior right parietal lobe, 8 mm, unchanged, image 120 10. Right parietal lobe postcentral sulcus, 15 mm, unchanged, image 140. Large amount of surrounding edema, unchanged. No acute infarct. No midline shift or herniation. Size and configuration of the ventricles are unchanged. VASCULAR: Major intracranial arterial and venous sinus flow voids are normal. SKULL AND UPPER CERVICAL SPINE: The bone marrow signal of the calvarium is normal. There are 2 left periorbital soft tissue lesions imbedded within the subcutaneous fat. The more inferior lesion measures 14 mm. The more superior lesion measures 13 mm. SINUSES/ORBITS: No fluid levels or advanced mucosal thickening. No mastoid or middle ear effusion. The orbits are normal. IMPRESSION: 1. Unchanged appearance of 9 brain parenchymal  metastases with edema greatest in the right parietal and frontal lobes. A 10th metastasis was not visible on the prior study, probably due to differences in slice thickness. 2. Unchanged appearance of 2 lesions within the left periorbital subcutaneous fat. 3. No acute abnormality. Electronically Signed   By: Ulyses Jarred M.D.   On: 07/23/2018 13:52             Medical Problem List and Plan: 1.  Decreased functional mobility with left side weakness secondary to brain metastasis due to non-small cell lung cancer with right upper lobe lobectomy. Plan SRS radiation therapy per follow-up radiation oncology. Palliative care consult has been obtained             -admitting to inpatient rehab 2.  DVT Prophylaxis/Anticoagulation: SCD 3. Pain Management:  Neurontin 600 mg daily at bedtime,Lodine 400 mg twice a day MSIR 7.5 mg every 4 hours as needed             -pain controlled at present 4. Mood:  Provide emotional support 5. Neuropsych: This patient is capable of making decisions on her own behalf. 6. Skin/Wound Care:  Routine skin checks 7.  Fluids/Electrolytes/Nutrition:  Routine in and out's with follow-up chemistries.              -encourage PO 8. Hypertension. Norvasc 10 mg daily, Coreg 25 mg twice a day. Monitor with increased mobility 9. Hypothyroidism. Synthroid 10. Hyperlipidemia. Lipitor       Post Admission Physician Evaluation: 1. Functional deficits secondary  to brain mets from lung cancer. 2. Patient is admitted to receive collaborative, interdisciplinary care between the physiatrist, rehab nursing staff, and therapy team. 3. Patient's level of medical complexity and substantial therapy needs in context of that medical necessity cannot be provided at a lesser intensity of care such as a SNF. 4. Patient has experienced substantial functional loss from his/her baseline which was documented above under the "Functional History" and "Functional Status" headings.  Judging by the patient's diagnosis, physical exam, and functional history, the patient has potential for functional progress which will result in measurable gains while on inpatient rehab.  These gains will be of substantial and practical use upon discharge  in facilitating mobility and self-care at the household level. 5. Physiatrist will provide 24 hour management of medical needs as well as oversight of the therapy plan/treatment and provide guidance as appropriate regarding the interaction of the two. 6. The Preadmission Screening has been reviewed and patient status is unchanged unless otherwise stated above. 7. 24 hour rehab nursing will assist with bladder management, bowel management, safety, skin/wound care, disease management, medication administration, pain management and patient education  and help integrate therapy concepts, techniques,education, etc. 8. PT will assess and treat for/with: Lower extremity strength, range of motion, stamina, balance, functional mobility, safety, adaptive techniques and equipment, NMR, family education, orthotics.   Goals are:  min assist. 9. OT will assess and treat for/with: ADL's, functional mobility, safety, upper extremity strength, adaptive techniques and equipment, NMR, pain mgt, family ed.   Goals are: min assist. Therapy may proceed with showering this patient. 10. SLP will assess and treat for/with: n/a.  Goals are: n/a. 11. Case Management and Social Worker will assess and treat for psychological issues and discharge planning. 12. Team conference will be held weekly to assess progress toward goals and to determine barriers to discharge. 13. Patient will receive at least 3 hours of therapy per day at least 5 days per week. 14. ELOS: 18-23  days       15. Prognosis:  excellent     I have personally performed a face to face diagnostic evaluation of this patient and formulated the key components of the plan.  Additionally, I have personally reviewed laboratory data, imaging studies, as well as relevant notes and concur with the physician assistant's documentation above.   Meredith Staggers, MD, [facility] Drown      Lavon Paganini Gerald, PA-C 07/24/2018   The patient's status has not changed. The original post admission physician evaluation remains appropriate, and any changes from the pre-admission screening or documentation from the acute chart are noted above.  Meredith Staggers, MD 07/24/2018

## 2018-07-24 NOTE — PMR Pre-admission (Signed)
PMR Admission Coordinator Pre-Admission Assessment  Patient: [name] is an 78 y.o., female MRN: [id number] DOB: 11-07-1940 Height: 5\' 3"  (160 cm) Weight: 58.1 kg              Insurance Information HMO: No    PPO:       PCP:       IPA:       80/20:       OTHER:   PRIMARY:  Medicare a and b      Policy#: [id number]      Subscriber: patient CM Name:        Phone#:       Fax#:   Pre-Cert#:        Employer: Retired Benefits:  Phone #:       Name: Checked in [place] one source [employer] Date: 10/09/05     Deduct:  $1408      Out of Pocket Max: none      Life Max: N/A CIR: 100%      SNF: 100 days Outpatient: 80%     Co-Pay: 20% Home Health: 100%      Co-Pay: none DME: 80%     Co-Pay: 20% Providers: patient's choice  SECONDARY:  [employer]      Policy#: [id number]      Subscriber: patient CM Name:        Phone#:       Fax#:   Pre-Cert#:        Employer: Retired Benefits:  Phone #: [phone]     Name:   Eff. Date: 01/08/06     Deduct:        Out of Pocket Max:        Life Max:   CIR:        SNF:   Outpatient:       Co-Pay:   Home Health:        Co-Pay:   DME:       Co-Pay:    Emergency Contact Information Contact Information    Name Relation Home Work Mobile   [place] Son   [phone]   Marchelle Gearing   [phone]     Current Medical History  Patient Admitting Diagnosis:  Brain metastasis  History of Present Illness: A 78 year old right-handed female with history of chronic renal insufficiency stage III, hypertension, hyperlipidemia, non-small cell lung cancer with right upper lobe lobectomy 2009 recurrence in 2013 that was treated with radiation therapy and followed by oncology services. Per chart review and patient, patient lives alone independent prior to admission for most tasks with assistance of children and grandchildren for other tasks. She does have family in good support. Presented 07/21/2018 after recent fall with left arm weakness. MRI the brain  reviewed, showing intra-and extracranial brain masses. Per report, showed at least 8 parenchymal metastasis, to left periorbital subcutaneous brain lesions with marked surrounding edema likely reflecting subcutaneous metastasis. Oncology services follow-up Dr. Jana Hakim with plan for [facility] radiation therapy which was also discussed with Dr. Julien Nordmann and Dr. Lisbeth Renshaw of radiation oncology. Therapy evaluations completed with recommendations of physical medicine rehabilitation consult. Patient was admitted for a comprehensive rehabilitation program.   Complete NIHSS TOTAL: 0  Past Medical History  Past Medical History:  Diagnosis Date  . Anemia 09/2017  . Arthritis    back.and feet  . Cervical ca ([place]) dx'd 1988   surg only  . CKD (chronic kidney disease), stage III ([place])   . Complication of anesthesia  difficulty awakening, dysrhythmia post surgery, (prolonged sedation level)  . Coronary artery calcification seen on CT scan   . Dyspnea   . GERD (gastroesophageal reflux disease)   . Headache    occ. sinus type headaches  . History of hiatal hernia   . Hx of radiation therapy 09/16/13-09/30/13   prevascular lymph node  . Hyperlipidemia   . Hypertension   . Hyperthyroidism    dr Loanne Drilling- radioactive iodine treatment 2011- resolved  . Hypothyroidism   . LBBB (left bundle branch block)   . Lung cancer ([place])    Bilaterally, RUL lobectomy and spot involving both lungs- "cancer"  . Macular degeneration    bilateral  . Melanoma ([place])    left leg  . Radiation 10/11/11-10/21/11   Left upper lobe adenocarcinoma  . Tremor    hand- intention tremor    Family History  family history includes Asthma in her mother; Cancer in her maternal grandfather and maternal uncle; Heart disease in her mother; Thyroid disease in her daughter.  Prior Rehab/Hospitalizations: Was at [facility] in 2019 after hospitalization with cardiac stent and cardiac arrest per her son.  Has the patient had major surgery  during 100 days prior to admission? No  Current Medications   Current Facility-Administered Medications:  .  acetaminophen (TYLENOL) tablet 1,000 mg, 1,000 mg, Oral, Once, Ghimire, Dante Gang, MD .  acetaminophen (TYLENOL) tablet 650 mg, 650 mg, Oral, Q6H, Ghimire, Kuber, MD, 650 mg at 07/24/18 1258 .  amLODipine (NORVASC) tablet 10 mg, 10 mg, Oral, Daily, Oretha Milch D, MD, 10 mg at 07/24/18 1043 .  amLODipine (NORVASC) tablet 5 mg, 5 mg, Oral, Once, [place], Myrlene Broker D, MD .  atorvastatin (LIPITOR) tablet 80 mg, 80 mg, Oral, Q2200, Barb Merino, MD, 80 mg at 07/23/18 2119 .  calcium-vitamin D (OSCAL WITH D) 500-200 MG-UNIT per tablet 2 tablet, 2 tablet, Oral, Q breakfast, Barb Merino, MD, 2 tablet at 07/24/18 1043 .  carvedilol (COREG) tablet 25 mg, 25 mg, Oral, BID, Barb Merino, MD, 25 mg at 07/24/18 1043 .  dexamethasone (DECADRON) tablet 4 mg, 4 mg, Oral, Q6H, Ghimire, Kuber, MD, 4 mg at 07/24/18 1258 .  etodolac (LODINE) tablet 400 mg, 400 mg, Oral, BID, Barb Merino, MD, 400 mg at 07/24/18 1043 .  gabapentin (NEURONTIN) capsule 600 mg, 600 mg, Oral, QHS, Ghimire, Kuber, MD, 600 mg at 07/23/18 2119 .  ketotifen (ZADITOR) 0.025 % ophthalmic solution 1 drop, 1 drop, Right Eye, BID PRN, Barb Merino, MD .  levothyroxine (SYNTHROID, LEVOTHROID) tablet 88 mcg, 88 mcg, Oral, QAC breakfast, Barb Merino, MD, 88 mcg at 07/24/18 0525 .  losartan (COZAAR) tablet 50 mg, 50 mg, Oral, BID, Barb Merino, MD, 50 mg at 07/24/18 1043 .  morphine (MSIR) tablet 7.5 mg, 7.5 mg, Oral, Q4H PRN, Barb Merino, MD, 7.5 mg at 07/21/18 0514 .  multivitamin with minerals tablet 1 tablet, 1 tablet, Oral, Daily, Barb Merino, MD, 1 tablet at 07/24/18 1043 .  nitroGLYCERIN (NITROSTAT) SL tablet 0.4 mg, 0.4 mg, Sublingual, Q5 Min x 3 PRN, Ghimire, Kuber, MD .  ondansetron (ZOFRAN-ODT) disintegrating tablet 4 mg, 4 mg, Oral, Q8H PRN, Barb Merino, MD .  pantoprazole (PROTONIX) EC tablet 40 mg, 40 mg,  Oral, BID, Barb Merino, MD, 40 mg at 07/24/18 1043 .  promethazine (PHENERGAN) tablet 25 mg, 25 mg, Oral, Q6H PRN, Barb Merino, MD  Patients Current Diet:  Diet Order            Diet - low sodium  heart healthy        Diet regular Room service appropriate? Yes; Fluid consistency: Thin  Diet effective now              Precautions / Restrictions Precautions Precautions: Fall Precaution Comments: L weakness/neglect and fall at home due to same Restrictions Weight Bearing Restrictions: No   Has the patient had 2 or more falls or a fall with injury in the past year?Yes.  Had one fall with injury resulting in current admission.  Prior Activity Level Limited Community (1-2x/wk): Went out 3 X a week, was driving  Development worker, international aid / Publishing rights manager: Environmental consultant - 2 wheels, Environmental consultant - 4 wheels, [place] - single point, Bedside commode, Shower seat, Wheelchair - manual  Prior Device Use: Indicate devices/aids used by the patient prior to current illness, exacerbation or injury? Walker and [employer]  Prior Functional Level Prior Function Level of Independence: Independent  Self Care: Did the patient need help bathing, dressing, using the toilet or eating?  Independent  Indoor Mobility: Did the patient need assistance with walking from room to room (with or without device)? Independent  Stairs: Did the patient need assistance with internal or external stairs (with or without device)? Independent  Functional Cognition: Did the patient need help planning regular tasks such as shopping or remembering to take medications? Independent  Current Functional Level Cognition  Overall Cognitive Status: Within Functional Limits for tasks assessed Orientation Level: Oriented X4 General Comments: pleasent and engaged    Extremity Assessment (includes Sensation/Coordination)  Upper Extremity Assessment: Defer to OT evaluation(LUE ataxia/lack of motor control, no grip) LUE Deficits /  Details: grossly 3+/5. LUE neglect noted. Unable to maintain functional grasp. LUE Sensation: decreased proprioception LUE Coordination: decreased fine motor, decreased gross motor  Lower Extremity Assessment: LLE deficits/detail LLE Deficits / Details: 3/5 max at knee/ankle, able to hold knee extension to flex ankle 2-3 reps max; knee occasionally to frequently buckles during gait cycle LLE Coordination: decreased gross motor    ADLs  Overall ADL's : Needs assistance/impaired Eating/Feeding: Minimal assistance, Sitting, Set up Grooming: Moderate assistance, Sitting Upper Body Bathing: Moderate assistance, Sitting Lower Body Bathing: Maximal assistance, +2 for safety/equipment Upper Body Dressing : Moderate assistance, Sitting Lower Body Dressing: Maximal assistance, +2 for safety/equipment, Sit to/from stand Toilet Transfer: Maximal assistance, Stand-pivot, BSC, RW Toilet Transfer Details (indicate cue type and reason): Simulated with EOB>recliner. Therapist providing steadying assist on [place], pt controlling rw with R hand only. Cues for timing of sitting into chair. General ADL Comments: Pt completed bed mobility, sat EOB several minutes, stood 2x from EOB, side stepped left then right, then completed pivotal steps to recliner. Discussed safety and strategies for LUE neglect.    Mobility  Overal bed mobility: Needs Assistance Bed Mobility: Rolling, Sidelying to Sit Rolling: Supervision Sidelying to sit: Supervision Supine to sit: Min assist, HOB elevated General bed mobility comments: cues for technique    Transfers  Overall transfer level: Needs assistance Equipment used: Rolling walker (2 wheeled) Transfers: Sit to/from Stand Sit to Stand: Min assist, +2 safety/equipment Stand pivot transfers: Max assist General transfer comment: assist for safety, cues for hand placement    Ambulation / Gait / Stairs / Wheelchair Mobility  Ambulation/Gait Ambulation/Gait assistance: Mod  assist, +2 safety/equipment(chair follow) Gait Distance (Feet): 40 Feet Assistive device: Rolling walker (2 wheeled), 1 person hand held assist Gait Pattern/deviations: Decreased step length - left, Decreased weight shift to right, Wide base of support General Gait Details: assist with  walker to keep L hand on walker, then provided R HHA without walker and continued to walk, though pt more fearful of falling.  Assist for R lateral weight shift to improve L foot clearance    Posture / Balance Balance Overall balance assessment: Needs assistance, History of Falls Sitting-balance support: Feet supported, No upper extremity supported Sitting balance-Leahy Scale: Fair Standing balance support: Single extremity supported, During functional activity Standing balance-Leahy Scale: Poor Standing balance comment: overshifts to L and assist to weight shift R; worked on during gait; too fatigued to stand again and work on Civil Service fast streamer needs/care consideration BiPAP/CPAP No CPM No Continuous Drip IV No Dialysis No       Life Vest No Oxygen No Special Bed No Trach Size No Wound Vac (area) No   Skin Bruising on face, eye left side                             Bowel mgmt: Last BM 07/23/18 Bladder mgmt: External catheter Diabetic mgmt No    Previous Home Environment Living Arrangements: Alone Available Help at Discharge: Family Type of Home: House Home Layout: Two level, Able to live on main level with bedroom/bathroom Home Access: Stairs to enter Entrance Stairs-Rails: Right Entrance Stairs-Number of Steps: 1 Bathroom Shower/Tub: Multimedia programmer: Handicapped height Bathroom Accessibility: Yes Additional Comments: Pt lives alone but has family who can stay with her at d/c if needed  Discharge Living Setting Plans for Discharge Living Setting: Alone, House Type of Home at Discharge: House Discharge Home Layout: Two level, Able to live on main level with  bedroom/bathroom Alternate Level Stairs-Number of Steps: Flight Discharge Home Access: Stairs to enter Technical brewer of Steps: 2 step entry Discharge Bathroom Shower/Tub: Walk-in shower, Door Discharge Bathroom Toilet: Standard Discharge Bathroom Accessibility: No(Have put in many hand rails to assist.)  [place] Patient Roles: Parent, Other (Comment)(Has a son, daughter, brother.  Widow since 02/07/17.) Contact Information: Ermelinda Das - son  Anticipated Caregiver: son, daughter, brother, other family Anticipated Caregiver's Contact Information: See emergency contacts Ability/Limitations of Caregiver: Family willing to provide care.  Has a long term care policy and can hire help as needed. Caregiver Availability: Other (Comment)(Family aware of need to plan for 24/7 care) Discharge Plan Discussed with Primary Caregiver: Yes Is Caregiver In Agreement with Plan?: Yes Does Caregiver/Family have Issues with Lodging/Transportation while Pt is in Rehab?: No  Goals/Additional Needs Patient/Family Goal for Rehab: PT/OT min assist goals Expected length of stay: 18-23 days Cultural Considerations: None Dietary Needs: Regular diet, thin liquids Equipment Needs: TBD Special Service Needs: Patient will need steriotactic radiation next Wednesday at [facility]; time is pending. Pt/Family Agrees to Admission and willing to participate: Yes Program Orientation Provided & Reviewed with Pt/Caregiver Including Roles  & Responsibilities: Yes  Decrease burden of Care through IP rehab admission: N/A  Possible need for SNF placement upon discharge: Not anticipated  Patient Condition: This patient's condition remains as documented in the consult dated 07/23/18, in which the Rehabilitation Physician determined and documented that the patient's condition is appropriate for intensive rehabilitative care in an inpatient rehabilitation facility. Will admit to inpatient rehab  today.  Preadmission Screen Completed By:  [name], 07/24/2018 3:41 PM ______________________________________________________________________   Discussed status with Dr. Naaman Plummer on 07/24/18 at 1539 and received telephone approval for admission today.  Admission Coordinator:  [name], time 1541/Date 07/24/18

## 2018-07-24 NOTE — Care Management Important Message (Signed)
Important Message  Patient Details  Name: [name] MRN: [id number] Date of Birth: 1940-12-27   Medicare Important Message Given:  Yes    Orbie Pyo 07/24/2018, 3:46 PM

## 2018-07-24 NOTE — Progress Notes (Signed)
Patient ID: [name], female   DOB: 1941/06/26, 78 y.o.   MRN: [id number]   Thank you for consulting the Palliative Medicine Team at [facility] to meet your patient's and family's needs.   The reason that you asked [place] to see your patient is to help establish GOC  We have scheduled your patient for a meeting: Tomorrow 07-25-18 at 11:30  Other family members that need to be present: children  The patient is able to participate in the meeting.  Wadie Lessen NP  Palliative Medicine Team Team Phone # [phone] Pager [phone]  No charge

## 2018-07-24 NOTE — H&P (Signed)
Physical Medicine and Rehabilitation Admission H&P    Chief Complaint  Patient presents with  . Code Stroke  : HPI: [name] is a 78 year old right-handed female with history of chronic renal insufficiency stage III, hypertension, hyperlipidemia, non-small cell lung cancer with right upper lobe lobectomy 2009 recurrence in 2013 that was treated with radiation therapy and followed by oncology services. Per chart review and patient, patient lives alone independent prior to admission for most tasks with assistance of children and grandchildren for other tasks. She does have family in good support. Presented 07/21/2018 after recent fall with left arm weakness. MRI the brain reviewed, showing intra-and extracranial brain masses. Per report, showed at least 8 parenchymal metastasis, to left periorbital subcutaneous brain lesions with marked surrounding edema likely reflecting subcutaneous metastasis. Oncology services follow-up Dr. Jana Hakim with plan for [facility] radiation therapy which was also discussed with Dr. Julien Nordmann and Dr. Lisbeth Renshaw of radiation oncology. Therapy evaluations completed with recommendations of physical medicine rehabilitation consult. Patient was admitted for a comprehensive rehabilitation program.  Review of Systems  Constitutional: Negative for chills and fever.  HENT: Negative for hearing loss.   Eyes: Negative for blurred vision and double vision.  Respiratory: Positive for shortness of breath.   Cardiovascular: Negative for chest pain, palpitations and leg swelling.  Gastrointestinal: Positive for constipation. Negative for nausea and vomiting.       GERD  Genitourinary: Negative for dysuria, flank pain and hematuria.  Musculoskeletal: Positive for back pain and falls.  Skin: Negative for rash.  Neurological: Positive for focal weakness.  All other systems reviewed and are negative.  Past Medical History:  Diagnosis Date  . Anemia 09/2017  . Arthritis    back.and  feet  . Cervical ca ([place]) dx'd 1988   surg only  . CKD (chronic kidney disease), stage III ([place])   . Complication of anesthesia    difficulty awakening, dysrhythmia post surgery, (prolonged sedation level)  . Coronary artery calcification seen on CT scan   . Dyspnea   . GERD (gastroesophageal reflux disease)   . Headache    occ. sinus type headaches  . History of hiatal hernia   . Hx of radiation therapy 09/16/13-09/30/13   prevascular lymph node  . Hyperlipidemia   . Hypertension   . Hyperthyroidism    dr Loanne Drilling- radioactive iodine treatment 2011- resolved  . Hypothyroidism   . LBBB (left bundle branch block)   . Lung cancer ([place])    Bilaterally, RUL lobectomy and spot involving both lungs- "cancer"  . Macular degeneration    bilateral  . Melanoma ([place])    left leg  . Radiation 10/11/11-10/21/11   Left upper lobe adenocarcinoma  . Tremor    hand- intention tremor   Past Surgical History:  Procedure Laterality Date  . basal cell and squamous cell skin cell area removed     new left lower eye lid done with skin graft   . CARDIAC CATHETERIZATION  09/21/2017  . CATARACT EXTRACTION, BILATERAL Bilateral   . COLONOSCOPY WITH PROPOFOL N/A 10/27/2016   Procedure: COLONOSCOPY WITH PROPOFOL;  Surgeon: Laurence Spates, MD;  Location: [facility];  Service: Endoscopy;  Laterality: N/A;  . conization for cervical dysplasia    . CORONARY STENT INTERVENTION N/A 09/21/2017   Procedure: CORONARY STENT INTERVENTION;  Surgeon: Belva Crome, MD;  Location: [place];  Service: Cardiovascular;  Laterality: N/A;  . CORONARY/GRAFT ACUTE MI REVASCULARIZATION N/A 09/21/2017   Procedure: Coronary/Graft Acute MI Revascularization;  Surgeon: Belva Crome, MD;  Location: [place];  Service: Cardiovascular;  Laterality: N/A;  . ESOPHAGOGASTRODUODENOSCOPY (EGD) WITH PROPOFOL N/A 09/28/2017   Procedure: ESOPHAGOGASTRODUODENOSCOPY (EGD) WITH PROPOFOL;  Surgeon: Arta Silence, MD;   Location: [facility];  Service: Endoscopy;  Laterality: N/A;  . ESOPHAGOGASTRODUODENOSCOPY (EGD) WITH PROPOFOL N/A 09/30/2017   Procedure: ESOPHAGOGASTRODUODENOSCOPY (EGD) WITH PROPOFOL;  Surgeon: Otis Brace, MD;  Location: [facility];  Service: Gastroenterology;  Laterality: N/A;  . esophogus stretching    . GIVENS CAPSULE STUDY N/A 10/06/2017   Procedure: GIVENS CAPSULE STUDY;  Surgeon: Arta Silence, MD;  Location: [facility] ENDOSCOPY;  Service: Endoscopy;  Laterality: N/A;  . INTRAVASCULAR ULTRASOUND/IVUS N/A 09/21/2017   Procedure: Intravascular Ultrasound/IVUS;  Surgeon: Belva Crome, MD;  Location: [facility];  Service: Cardiovascular;  Laterality: N/A;  . IR GENERIC HISTORICAL  09/11/2014   IR RADIOLOGIST EVAL & MGMT 09/11/2014 Jacqulynn Cadet, MD [facility] INTERV RAD  . LEFT HEART CATH AND CORONARY ANGIOGRAPHY N/A 09/21/2017   Procedure: LEFT HEART CATH AND CORONARY ANGIOGRAPHY;  Surgeon: Belva Crome, MD;  Location: [facility];  Service: Cardiovascular;  Laterality: N/A;  . LEFT HEART CATH AND CORONARY ANGIOGRAPHY N/A 09/21/2017   Procedure: LEFT HEART CATH AND CORONARY ANGIOGRAPHY;  Surgeon: Belva Crome, MD;  Location: [facility];  Service: Cardiovascular;  Laterality: N/A;  . radio frequency ablation on lung spot    . rul lobectomy  2010  . TONSILLECTOMY    . TONSILLECTOMY     Family History  Problem Relation Age of Onset  . Asthma Mother   . Heart disease Mother   . Thyroid disease Daughter        hypothyroidism  . Cancer Maternal Uncle        lung  . Cancer Maternal Grandfather        lung  . Diabetes Neg Hx   . Coronary artery disease Neg Hx    Social History:  reports that she quit smoking about 42 years ago. Her smoking use included cigarettes. She has a 17.00 pack-year smoking history. She has never used smokeless tobacco. She reports current alcohol use. She reports that she does not use drugs. Allergies:  Allergies  Allergen Reactions    . Meperidine Hcl Other (See Comments)    "knocked pt out for 4 days"  . Morphine Other (See Comments)    Stopped breathing due to too high of a dose per patient.  Not an allergic reaction to morphine  . Penicillins Anaphylaxis, Shortness Of Breath and Other (See Comments)    Difficulty breathing  . Oxycodone-Acetaminophen Itching       . Propoxyphene N-Acetaminophen Nausea And Vomiting  . Tramadol Hcl Nausea And Vomiting   Medications Prior to Admission  Medication Sig Dispense Refill  . acetaminophen (TYLENOL) 650 MG CR tablet Take 1,300 mg by mouth 2 (two) times daily.     Marland Kitchen amLODipine (NORVASC) 10 MG tablet Take 0.5 tablets (5 mg total) by mouth daily. 30 tablet 11  . atorvastatin (LIPITOR) 80 MG tablet Take 1 tablet (80 mg total) by mouth daily. 90 tablet 3  . Calcium Carbonate-Vitamin D3 (CALCIUM 600-D) 600-400 MG-UNIT TABS Take 1 tablet by mouth every morning.     . carvedilol (COREG) 25 MG tablet Take 1 tablet (25 mg total) by mouth 2 (two) times daily. 60 tablet 11  . DM-APAP-CPM (CORICIDIN HBP FLU PO) Take 2 tablets by mouth every 6 (six) hours as needed (  flu symptoms).    Marland Kitchen etodolac (LODINE) 400 MG tablet Take 400 mg by mouth 2 (two) times daily.  3  . fluticasone (FLONASE) 50 MCG/ACT nasal spray Place 1 spray into both nostrils daily as needed for allergies.     Marland Kitchen gabapentin (NEURONTIN) 300 MG capsule Take 600 mg by mouth at bedtime.   2  . ketotifen (ZADITOR) 0.025 % ophthalmic solution Place 1 drop into the right eye 2 (two) times daily as needed (irritation).    Marland Kitchen levothyroxine (SYNTHROID, LEVOTHROID) 88 MCG tablet Take 88 mcg by mouth daily before breakfast.     . losartan (COZAAR) 50 MG tablet Take 1 tablet (50 mg total) by mouth 2 (two) times daily. 180 tablet 3  . Multiple Vitamin (MULTIVITAMIN WITH MINERALS) TABS tablet Take 1 tablet by mouth daily.    . Multiple Vitamins-Minerals (PRESERVISION AREDS 2 PO) Take 1 capsule by mouth at bedtime.     . nitroGLYCERIN  (NITROSTAT) 0.4 MG SL tablet Place 1 tablet (0.4 mg total) under the tongue every 5 (five) minutes as needed for chest pain.  0  . ondansetron (ZOFRAN ODT) 4 MG disintegrating tablet Take 1 tablet (4 mg total) by mouth every 8 (eight) hours as needed for nausea or vomiting. 10 tablet 0  . pantoprazole (PROTONIX) 40 MG tablet Take 1 tablet (40 mg total) by mouth 2 (two) times daily.    Vladimir Faster Glycol-Propyl Glycol (SYSTANE) 0.4-0.3 % SOLN Apply 1 drop to eye daily as needed.    . promethazine (PHENERGAN) 25 MG tablet Take 25 mg by mouth every 6 (six) hours as needed for nausea or vomiting.      Drug Regimen Review Drug regimen was reviewed and remains appropriate with no significant issues identified  Home: Home Living Family/patient expects to be discharged to:: Private residence Living Arrangements: Alone Available Help at Discharge: Family Type of Home: House Home Access: Stairs to enter Technical brewer of Steps: 1 Entrance Stairs-Rails: Right Home Layout: Two level, Able to live on main level with bedroom/bathroom Bathroom Shower/Tub: Multimedia programmer: Handicapped height Bathroom Accessibility: Yes Home Equipment: Environmental consultant - 2 wheels, Walker - 4 wheels, Cane - single point, Bedside commode, Shower seat, Wheelchair - manual Additional Comments: Pt lives alone but has family who can stay with her at d/c if needed   Functional History: Prior Function Level of Independence: Independent  Functional Status:  Mobility: Bed Mobility Overal bed mobility: Needs Assistance Bed Mobility: Rolling, Sidelying to Sit Rolling: Supervision Sidelying to sit: Supervision Supine to sit: Min assist, HOB elevated General bed mobility comments: cues for technique Transfers Overall transfer level: Needs assistance Equipment used: Rolling walker (2 wheeled) Transfers: Sit to/from Stand Sit to Stand: Min assist, +2 safety/equipment Stand pivot transfers: Max assist General  transfer comment: assist for safety, cues for hand placement Ambulation/Gait Ambulation/Gait assistance: Mod assist, +2 safety/equipment(chair follow) Gait Distance (Feet): 40 Feet Assistive device: Rolling walker (2 wheeled), 1 person hand held assist Gait Pattern/deviations: Decreased step length - left, Decreased weight shift to right, Wide base of support General Gait Details: assist with walker to keep L hand on walker, then provided R HHA without walker and continued to walk, though pt more fearful of falling.  Assist for R lateral weight shift to improve L foot clearance    ADL: ADL Overall ADL's : Needs assistance/impaired Eating/Feeding: Minimal assistance, Sitting, Set up Grooming: Moderate assistance, Sitting Upper Body Bathing: Moderate assistance, Sitting Lower Body Bathing: Maximal assistance, +2 for  safety/equipment Upper Body Dressing : Moderate assistance, Sitting Lower Body Dressing: Maximal assistance, +2 for safety/equipment, Sit to/from stand Toilet Transfer: Maximal assistance, Stand-pivot, BSC, RW Toilet Transfer Details (indicate cue type and reason): Simulated with EOB>recliner. Therapist providing steadying assist on [place], pt controlling rw with R hand only. Cues for timing of sitting into chair. General ADL Comments: Pt completed bed mobility, sat EOB several minutes, stood 2x from EOB, side stepped left then right, then completed pivotal steps to recliner. Discussed safety and strategies for LUE neglect.  Cognition: Cognition Overall Cognitive Status: Within Functional Limits for tasks assessed Orientation Level: Oriented X4 Cognition Arousal/Alertness: Awake/alert Behavior During Therapy: WFL for tasks assessed/performed Overall Cognitive Status: Within Functional Limits for tasks assessed General Comments: pleasent and engaged  Physical Exam: Blood pressure (!) 156/80, pulse 77, temperature 97.8 F (36.6 C), temperature source Oral, resp. rate 20,  height 5\' 3"  (1.6 m), weight 58.1 kg, SpO2 99 %. Physical Exam  Constitutional: She appears well-developed. No distress.  HENT:  Bandage on nose. Bruising/swelling around left eye  Eyes: Pupils are equal, round, and reactive to light. Conjunctivae are normal.  Neck: Normal range of motion. No thyromegaly present.  Cardiovascular: Normal rate and regular rhythm. Exam reveals no friction rub.  No murmur heard. Respiratory: Effort normal. No respiratory distress. She has no wheezes. She has no rales.  GI: Soft. She exhibits no distension. There is no abdominal tenderness.  Musculoskeletal: Normal range of motion.  Neurological:  Patient is alert. Makes good eye contact with examiner. Follows commands. Fair awareness of deficits. LUE 2-3/5 prox to distal with decreased FMC/ataxia. LLE 4/5 with sl decrease in [facility]. RUE and RLE 4 to 4+/5. No gross sensory findings.   Skin:  Incision on medial left ankle from prior surgery  Psychiatric: Her behavior is normal. Thought content normal.    Results for orders placed or performed during the hospital encounter of 07/21/18 (from the past 48 hour(s))  Comprehensive metabolic panel     Status: Abnormal   Collection Time: 07/23/18  5:32 AM  Result Value Ref Range   Sodium 140 135 - 145 mmol/L   Potassium 3.8 3.5 - 5.1 mmol/L   Chloride 108 98 - 111 mmol/L   CO2 24 22 - 32 mmol/L   Glucose, Bld 132 (H) 70 - 99 mg/dL   BUN 26 (H) 8 - 23 mg/dL   Creatinine, Ser 1.03 (H) 0.44 - 1.00 mg/dL   Calcium 8.8 (L) 8.9 - 10.3 mg/dL   Total Protein 5.2 (L) 6.5 - 8.1 g/dL   Albumin 3.0 (L) 3.5 - 5.0 g/dL   AST 21 15 - 41 U/L   ALT 19 0 - 44 U/L   Alkaline Phosphatase 48 38 - 126 U/L   Total Bilirubin 0.5 0.3 - 1.2 mg/dL   GFR calc non Af Amer 52 (L) >60 mL/min   GFR calc Af Amer >60 >60 mL/min   Anion gap 8 5 - 15    Comment: Performed at [facility], [address], [place], [place] [address]  Comprehensive metabolic panel     Status: Abnormal    Collection Time: 07/24/18  5:05 AM  Result Value Ref Range   Sodium 140 135 - 145 mmol/L   Potassium 3.8 3.5 - 5.1 mmol/L   Chloride 107 98 - 111 mmol/L   CO2 24 22 - 32 mmol/L   Glucose, Bld 129 (H) 70 - 99 mg/dL   BUN 27 (H) 8 - 23  mg/dL   Creatinine, Ser 0.89 0.44 - 1.00 mg/dL   Calcium 9.3 8.9 - 10.3 mg/dL   Total Protein 5.4 (L) 6.5 - 8.1 g/dL   Albumin 3.1 (L) 3.5 - 5.0 g/dL   AST 22 15 - 41 U/L   ALT 25 0 - 44 U/L   Alkaline Phosphatase 51 38 - 126 U/L   Total Bilirubin 1.0 0.3 - 1.2 mg/dL   GFR calc non Af Amer >60 >60 mL/min   GFR calc Af Amer >60 >60 mL/min   Anion gap 9 5 - 15    Comment: Performed at [facility], [place] [address], [place], [place] [address]   Mr Jeri Cos YF Contrast  Result Date: 07/23/2018 CLINICAL DATA:  Recurrent metastatic lung cancer stereotactic radiosurgery planning. EXAM: MRI HEAD WITHOUT AND WITH CONTRAST TECHNIQUE: Multiplanar, multiecho pulse sequences of the brain and surrounding structures were obtained without and with intravenous contrast. CONTRAST:  6 mL Gadavist COMPARISON:  Brain MRI 07/21/2018 FINDINGS: BRAIN: There are multiple contrast-enhancing lesions within the brain, consistent with parenchymal metastases, annotated on series 11: 1. Right cerebellar hemisphere, 2 mm, unchanged, image 56 2. Left cerebellar hemisphere, 14 mm, unchanged, image 62 3. Left temporal lobe, 12 mm, unchanged, image 66. Mild surrounding edema, unchanged. 4. Left pons, 8 mm, unchanged, image 70 5. Medial right temporal lobe, 7 mm, unchanged, image 78 6. Inferior left frontal lobe, 5 mm, unchanged, image 79 7. Right occipital lobe, 3 mm, not visible on the prior study (probably due to technical factors), image 93 8. Right frontal lobe, 20 mm, unchanged, image 111. Moderate surrounding edema, unchanged. 9. Posterior right parietal lobe, 8 mm, unchanged, image 120 10. Right parietal lobe postcentral sulcus, 15 mm, unchanged, image 140. Large amount of surrounding  edema, unchanged. No acute infarct. No midline shift or herniation. Size and configuration of the ventricles are unchanged. VASCULAR: Major intracranial arterial and venous sinus flow voids are normal. SKULL AND UPPER CERVICAL SPINE: The bone marrow signal of the calvarium is normal. There are 2 left periorbital soft tissue lesions imbedded within the subcutaneous fat. The more inferior lesion measures 14 mm. The more superior lesion measures 13 mm. SINUSES/ORBITS: No fluid levels or advanced mucosal thickening. No mastoid or middle ear effusion. The orbits are normal. IMPRESSION: 1. Unchanged appearance of 9 brain parenchymal metastases with edema greatest in the right parietal and frontal lobes. A 10th metastasis was not visible on the prior study, probably due to differences in slice thickness. 2. Unchanged appearance of 2 lesions within the left periorbital subcutaneous fat. 3. No acute abnormality. Electronically Signed   By: Ulyses Jarred M.D.   On: 07/23/2018 13:52       Medical Problem List and Plan: 1.  Decreased functional mobility with left side weakness secondary to brain metastasis due to non-small cell lung cancer with right upper lobe lobectomy. Plan SRS radiation therapy per follow-up radiation oncology. Palliative care consult has been obtained  -admitting to inpatient rehab 2.  DVT Prophylaxis/Anticoagulation: SCD 3. Pain Management:  Neurontin 600 mg daily at bedtime,Lodine 400 mg twice a day MSIR 7.5 mg every 4 hours as needed  -pain controlled at present 4. Mood:  Provide emotional support 5. Neuropsych: This patient is capable of making decisions on her own behalf. 6. Skin/Wound Care:  Routine skin checks 7. Fluids/Electrolytes/Nutrition:  Routine in and out's with follow-up chemistries.   -encourage PO 8. Hypertension. Norvasc 10 mg daily, Coreg 25 mg twice a day. Monitor  with increased mobility 9. Hypothyroidism. Synthroid 10. Hyperlipidemia. Lipitor    Post Admission  Physician Evaluation: 1. Functional deficits secondary  to brain mets from lung cancer. 2. Patient is admitted to receive collaborative, interdisciplinary care between the physiatrist, rehab nursing staff, and therapy team. 3. Patient's level of medical complexity and substantial therapy needs in context of that medical necessity cannot be provided at a lesser intensity of care such as a SNF. 4. Patient has experienced substantial functional loss from his/her baseline which was documented above under the "Functional History" and "Functional Status" headings.  Judging by the patient's diagnosis, physical exam, and functional history, the patient has potential for functional progress which will result in measurable gains while on inpatient rehab.  These gains will be of substantial and practical use upon discharge  in facilitating mobility and self-care at the household level. 5. Physiatrist will provide 24 hour management of medical needs as well as oversight of the therapy plan/treatment and provide guidance as appropriate regarding the interaction of the two. 6. The Preadmission Screening has been reviewed and patient status is unchanged unless otherwise stated above. 7. 24 hour rehab nursing will assist with bladder management, bowel management, safety, skin/wound care, disease management, medication administration, pain management and patient education  and help integrate therapy concepts, techniques,education, etc. 8. PT will assess and treat for/with: Lower extremity strength, range of motion, stamina, balance, functional mobility, safety, adaptive techniques and equipment, NMR, family education, orthotics.   Goals are: min assist. 9. OT will assess and treat for/with: ADL's, functional mobility, safety, upper extremity strength, adaptive techniques and equipment, NMR, pain mgt, family ed.   Goals are: min assist. Therapy may proceed with showering this patient. 10. SLP will assess and treat for/with:  n/a.  Goals are: n/a. 11. Case Management and Social Worker will assess and treat for psychological issues and discharge planning. 12. Team conference will be held weekly to assess progress toward goals and to determine barriers to discharge. 13. Patient will receive at least 3 hours of therapy per day at least 5 days per week. 14. ELOS: 18-23 days       15. Prognosis:  excellent   I have personally performed a face to face diagnostic evaluation of this patient and formulated the key components of the plan.  Additionally, I have personally reviewed laboratory data, imaging studies, as well as relevant notes and concur with the physician assistant's documentation above.  Meredith Staggers, MD, FAAPMR    Lavon Paganini [place], PA-C 07/24/2018

## 2018-07-24 NOTE — Progress Notes (Signed)
I called the patient's son to let him know MRI results and our plans to still proceed with SRS. He is in agreement. We also discussed a palliative care consult which he is interested in for goals of care discussion. I will ask for palliative care to come by and meet with them.     Carola Rhine, PAC

## 2018-07-24 NOTE — Discharge Summary (Signed)
Discharge Summary  PAT [name] XVQ:[id number] DOB: [birth year]  PCP: Wenda Low, MD  Admit date: 07/21/2018 Discharge date: 07/24/2018   Time spent: < 25 minutes  Admitted From: Home Disposition: [facility] inpatient rehab  Recommendations for Outpatient Follow-up:  1. Continue Decadron 4 mg every 6 hours, will eventually need taper pending radiation oncology Recs 2. Plan for [facility] (stereotactic radiosurgery) to start on 08/01/2018    Discharge Diagnoses:  Active Hospital Problems   Diagnosis Date Noted  . Brain metastasis ([place]) 07/21/2018  . Dyslipidemia   . Malignant neoplasm of right lung ([place])   . Leukocytosis   . Left hemiparesis ([place]) 07/21/2018  . 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 CKD (chronic kidney disease), stage III ([place])   . Hypothyroidism 03/15/2016  . Hyperlipidemia 08/19/2015  . Carotid artery disease without cerebral infarction ([place]) 05/20/2013  . Cancer of upper lobe of lung ([place]) 09/28/2011  . Essential hypertension, benign 12/01/2008    Resolved Hospital Problems  No resolved problems to display.    Discharge Condition: Stable  CODE STATUS: DNR  History of present illness:  [name] is a 78 y.o. year old female with medical history significant for lung cancer status post right upper lobe lobectomy (2009) with left upper lobe recurrence in 2013 status post radiation, CKD stage III, HTN, HLD, hypothyroidism who presented on 07/21/2018 with 2 weeks of left-sided weakness, with predominant loss of strength in left hand x 2 weeks (since around Christmas).  She lost her balance and fell striking the left side of her face on the floor was down for unknown period of time with an unclear if LOC.  Due to significant left-sided weakness and inability to get up on her own, family called EMS and patient presented to [facility].  CT head was notable for multifocal intracranial metastasis with concern for recurrence of her non-small cell lung  cancer.  On MRI brain with/without she was found to have a parenchymal metastatic lesions contrast remaining hospital course addressed in problem based format below:   Hospital Course:   Left-sided weakness, secondary to multiple parenchymal metastases to the brain, concern for recurrent metastatic right upper lobe NSCLC Concern this represents recurrence of her non-small cell lung cancer, but melanoma is also on the differential.  Neurology was initially on board due to presentation however stroke ruled out on MRI imaging.  Continue Decadron 4 mg every 6 hours for cerebral edema.    Palliative care has also been consulted for continued goals of care discussion and symptom management.  Oncology evaluated, no current need for neurosurgical intervention.  Radiation oncology plans to proceed with SRS (stereotactic radiosurgery on 08/01/2018, patient going for very procedure at [facility] on 1/14.  Accepted to [facility] inpatient rehab for skilled PT.  Periorbital bruising/swelling Vision is intact.  MRI imaging shows market surrounding edema with some concern for subcutaneous metastases.  Mild hypercalcemia, resolved Calcium briefly elevated to just over 10.5 resolved with IV fluids.    Hypertension, still quite elevated with systolics in the 093O.   Has had problems with controlling blood pressure as an outpatient.  We will increase her home amlodipine to 10 mg and continue Coreg 25 twice daily and losartan at prior to admission dosing. Marland Kitchen   Hypothyroidism, stable Continue home Synthroid  GERD, stable continue home Protonix  Hyperlipidemia, stable continue home Lipitor  Peripheral neuropathy Continue gabapentin  CKD stage III Creatinine stable at baseline.  Avoid nephrotoxins, monitor kidney function.  Consultations:  Palliative care, oncology, neurology  Procedures/Studies: None  Discharge Exam: BP (!) 159/66 (BP Location: Left Arm)   Pulse 79   Temp 97.8 F (36.6 C) (Oral)    Resp 16   Ht 5\' 3"  (1.6 m)   Wt 58.1 kg   SpO2 96%   BMI 22.69 kg/m   General: Lying in bed, no apparent distress Eyes: EOMI, anicteric, bruising around left eye ENT: Oral Mucosa clear and moist Cardiovascular: regular rate and rhythm, no murmurs, rubs or gallops, no edema, Respiratory: Normal respiratory effort on room air, lungs clear to auscultation bilaterally Abdomen: soft, non-distended, non-tender, normal bowel sounds Skin: No Rash Neurologic: Somewhat diminished handgrip strength, increased weakness and distal left hand, otherwise no focal neurologic deficits.Mental status AAOx3, speech normal, Psychiatric:Appropriate affect, and mood   Discharge Instructions You were cared for by a hospitalist during your hospital stay. If you have any questions about your discharge medications or the care you received while you were in the hospital after you are discharged, you can call the unit and asked to speak with the hospitalist on call if the hospitalist that took care of you is not available. Once you are discharged, your primary care physician will handle any further medical issues. Please note that NO REFILLS for any discharge medications will be authorized once you are discharged, as it is imperative that you return to your primary care physician (or establish a relationship with a primary care physician if you do not have one) for your aftercare needs so that they can reassess your need for medications and monitor your lab values.  Discharge Instructions    Diet - low sodium heart healthy   Complete by:  As directed    Increase activity slowly   Complete by:  As directed      Allergies as of 07/24/2018      Reactions   Meperidine Hcl Other (See Comments)   "knocked pt out for 4 days"   Morphine Other (See Comments)   Stopped breathing due to too high of a dose per patient.  Not an allergic reaction to morphine   Penicillins Anaphylaxis, Shortness Of Breath, Other (See  Comments)   Difficulty breathing   Oxycodone-acetaminophen Itching      Propoxyphene N-acetaminophen Nausea And Vomiting   Tramadol Hcl Nausea And Vomiting      Medication List    TAKE these medications   acetaminophen 650 MG CR tablet Commonly known as:  TYLENOL Take 2 tablets (1,300 mg total) by mouth 2 (two) times daily.   amLODipine 10 MG tablet Commonly known as:  NORVASC Take 1 tablet (10 mg total) by mouth daily. What changed:  how much to take   atorvastatin 80 MG tablet Commonly known as:  LIPITOR Take 1 tablet (80 mg total) by mouth daily.   Calcium Carbonate-Vitamin D3 600-400 MG-UNIT Tabs Commonly known as:  CALCIUM 600-D Take 1 tablet by mouth every morning.   carvedilol 25 MG tablet Commonly known as:  COREG Take 1 tablet (25 mg total) by mouth 2 (two) times daily.   CORICIDIN HBP FLU PO Take 2 tablets by mouth every 6 (six) hours as needed (flu symptoms).   dexamethasone 4 MG tablet Commonly known as:  DECADRON Take 1 tablet (4 mg total) by mouth every 6 (six) hours.   etodolac 400 MG tablet Commonly known as:  LODINE Take 1 tablet (400 mg total) by mouth 2 (two) times daily.   fluticasone 50 MCG/ACT nasal spray  Commonly known as:  FLONASE Place 1 spray into both nostrils daily as needed for allergies.   gabapentin 300 MG capsule Commonly known as:  NEURONTIN Take 2 capsules (600 mg total) by mouth at bedtime.   ketotifen 0.025 % ophthalmic solution Commonly known as:  ZADITOR Place 1 drop into the right eye 2 (two) times daily as needed (irritation).   levothyroxine 88 MCG tablet Commonly known as:  SYNTHROID, LEVOTHROID Take 1 tablet (88 mcg total) by mouth daily before breakfast.   losartan 50 MG tablet Commonly known as:  COZAAR Take 1 tablet (50 mg total) by mouth 2 (two) times daily.   morphine 15 MG tablet Commonly known as:  MSIR Take 0.5 tablets (7.5 mg total) by mouth every 4 (four) hours as needed for severe pain.     multivitamin with minerals Tabs tablet Take 1 tablet by mouth daily.   nitroGLYCERIN 0.4 MG SL tablet Commonly known as:  NITROSTAT Place 1 tablet (0.4 mg total) under the tongue every 5 (five) minutes as needed for chest pain.   ondansetron 4 MG disintegrating tablet Commonly known as:  ZOFRAN ODT Take 1 tablet (4 mg total) by mouth every 8 (eight) hours as needed for nausea or vomiting.   pantoprazole 40 MG tablet Commonly known as:  PROTONIX Take 1 tablet (40 mg total) by mouth 2 (two) times daily.   Polyethyl Glycol-Propyl Glycol 0.4-0.3 % Soln Commonly known as:  SYSTANE Apply 1 drop to eye daily as needed.   PRESERVISION AREDS 2 PO Take 1 capsule by mouth at bedtime.   promethazine 25 MG tablet Commonly known as:  PHENERGAN Take 1 tablet (25 mg total) by mouth every 6 (six) hours as needed for nausea or vomiting.      Allergies  Allergen Reactions  . Meperidine Hcl Other (See Comments)    "knocked pt out for 4 days"  . Morphine Other (See Comments)    Stopped breathing due to too high of a dose per patient.  Not an allergic reaction to morphine  . Penicillins Anaphylaxis, Shortness Of Breath and Other (See Comments)    Difficulty breathing  . Oxycodone-Acetaminophen Itching       . Propoxyphene N-Acetaminophen Nausea And Vomiting  . Tramadol Hcl Nausea And Vomiting      The results of significant diagnostics from this hospitalization (including imaging, microbiology, ancillary and laboratory) are listed below for reference.    Significant Diagnostic Studies: Mr Jeri Cos IR Contrast  Result Date: 07/23/2018 CLINICAL DATA:  Recurrent metastatic lung cancer stereotactic radiosurgery planning. EXAM: MRI HEAD WITHOUT AND WITH CONTRAST TECHNIQUE: Multiplanar, multiecho pulse sequences of the brain and surrounding structures were obtained without and with intravenous contrast. CONTRAST:  6 mL Gadavist COMPARISON:  Brain MRI 07/21/2018 FINDINGS: BRAIN: There are  multiple contrast-enhancing lesions within the brain, consistent with parenchymal metastases, annotated on series 11: 1. Right cerebellar hemisphere, 2 mm, unchanged, image 56 2. Left cerebellar hemisphere, 14 mm, unchanged, image 62 3. Left temporal lobe, 12 mm, unchanged, image 66. Mild surrounding edema, unchanged. 4. Left pons, 8 mm, unchanged, image 70 5. Medial right temporal lobe, 7 mm, unchanged, image 78 6. Inferior left frontal lobe, 5 mm, unchanged, image 79 7. Right occipital lobe, 3 mm, not visible on the prior study (probably due to technical factors), image 93 8. Right frontal lobe, 20 mm, unchanged, image 111. Moderate surrounding edema, unchanged. 9. Posterior right parietal lobe, 8 mm, unchanged, image 120 10. Right parietal lobe postcentral sulcus,  15 mm, unchanged, image 140. Large amount of surrounding edema, unchanged. No acute infarct. No midline shift or herniation. Size and configuration of the ventricles are unchanged. VASCULAR: Major intracranial arterial and venous sinus flow voids are normal. SKULL AND UPPER CERVICAL SPINE: The bone marrow signal of the calvarium is normal. There are 2 left periorbital soft tissue lesions imbedded within the subcutaneous fat. The more inferior lesion measures 14 mm. The more superior lesion measures 13 mm. SINUSES/ORBITS: No fluid levels or advanced mucosal thickening. No mastoid or middle ear effusion. The orbits are normal. IMPRESSION: 1. Unchanged appearance of 9 brain parenchymal metastases with edema greatest in the right parietal and frontal lobes. A 10th metastasis was not visible on the prior study, probably due to differences in slice thickness. 2. Unchanged appearance of 2 lesions within the left periorbital subcutaneous fat. 3. No acute abnormality. Electronically Signed   By: Ulyses Jarred M.D.   On: 07/23/2018 13:52   Mr Jeri Cos JO Contrast  Result Date: 07/21/2018 CLINICAL DATA:  Non-small cell lung cancer. Metastatic disease.  Progression. Abnormal CT head without contrast EXAM: MRI HEAD WITHOUT AND WITH CONTRAST TECHNIQUE: Multiplanar, multiecho pulse sequences of the brain and surrounding structures were obtained without and with intravenous contrast. CONTRAST:  5 mL Gadavist COMPARISON:  CT head without contrast 07/21/2018 FINDINGS: Brain: Multiple peripherally enhancing lesions are present with associated edema. Findings are consistent with metastatic disease to the brain. The lesion in the right parietal lobe measures 14 x 14 x 14 mm on image 14 of series 16. A posterior right parietal lesion measures 8 x 10 x 16 mm on image 40 of series 16. A medial anterior right frontal lobe lesion measures 21 x 17 x 22 mm on image 35 of series 16. A 3 mm right occipital lesion is best seen on the sagittal images. This is on image 7 of series 18. An anterior inferior left frontal lobe lesion measures 4 x 5 x 5 mm on image 26 of series 16. A left temporal lobe lesion measures 12 x 13 x 11 mm image 23 of series 16 A left anterior pontine lesion measures 8 x 8 x 9 mm on image 23 of series 16. A left cerebellar lesion measures 11 x 12 x 14 mm on image 21 of series 16. Restricted diffusion is present in the anterior right frontal lobe and lateral right parietal lobe lesions. There is also some restricted diffusion associated with the left temporal lobe lesion. The right frontal and parietal lobe lesions demonstrate the greatest degree of surrounding edema. There is some edema surrounding each of the other lesions as well. No acute infarct is present. Periventricular and subcortical white matter changes are present in addition to the edema surrounding the metastases. Vascular: Flow is present in the major intracranial arteries. Skull and upper cervical spine: The craniocervical junction is normal. Upper cervical spine is within normal limits. Marrow signal is unremarkable. Sinuses/Orbits: The paranasal sinuses and mastoid air cells are clear. Bilateral  lens replacements are noted. Globes and orbits are otherwise unremarkable. Other: 2 separate subcutaneous lesions are present lateral and superior to the left orbit. There is extensive surrounding edema. The lesions measure 13 x 15 and 14 x 14 mm respectively. IMPRESSION: 1. At least 8 parenchymal metastases as described above. 2. 2 left periorbital subcutaneous lesions with marked surrounding edema likely reflect subcutaneous metastases. 3. Mild atrophy and white matter disease otherwise likely reflects the sequela of chronic microvascular ischemia. Electronically Signed  By: San Morelle M.D.   On: 07/21/2018 11:18   Ct Head Code Stroke Wo Contrast  Result Date: 07/21/2018 CLINICAL DATA:  Code stroke. 78 y/o F; stroke with left-sided weakness. History of lung cancer. EXAM: CT HEAD WITHOUT CONTRAST TECHNIQUE: Contiguous axial images were obtained from the base of the skull through the vertex without intravenous contrast. COMPARISON:  09/18/2008 CT head. FINDINGS: Brain: At least 5 foci of mass effect with surrounding vasogenic edema in the right frontal lobe, right frontal parietal junction, right medial temporal lobe, left lateral temporal lobe, and the left cerebellar hemisphere (series 3 image 10, 14, 16, 24) probably representing metastatic disease. No findings of hemorrhage, stroke, extra-axial collection, hydrocephalus, or herniation. Background of mild chronic microvascular ischemic changes and volume loss of the brain. Vascular: Calcific atherosclerosis of carotid siphons. No hyperdense vessel identified. Skull: Normal. Negative for fracture or focal lesion. Large left frontal and periorbital scalp contusion with foci of hemorrhage measuring up to 2 cm. Sinuses/Orbits: No acute finding. Other: Bilateral intra-ocular lens replacement. ASPECTS [facility] Stroke Program Early CT Score) - Ganglionic level infarction (caudate, lentiform nuclei, internal capsule, insula, M1-M3 cortex): 7 -  Supraganglionic infarction (M4-M6 cortex): 3 Total score (0-10 with 10 being normal): 10 IMPRESSION: 1. At least 5 foci of mass effect with extensive surrounding edema throughout the brain, likely metastatic disease. MRI of the brain with and without contrast is recommended for further evaluation. 2. Large left frontal and periorbital scalp contusion. No calvarial fracture or acute intracranial traumatic finding. 3. ASPECTS is not applicable. These results were called by telephone at the time of interpretation on 07/21/2018 at 4:10 am to Dr. Leonel Ramsay , who verbally acknowledged these results. Electronically Signed   By: Kristine Garbe M.D.   On: 07/21/2018 04:15    Microbiology: No results found for this or any previous visit (from the past 240 hour(s)).   Labs: Basic Metabolic Panel: Recent Labs  Lab 07/21/18 0358 07/21/18 0405 07/23/18 0532 07/24/18 0505  NA 141 139 140 140  K 3.7 3.8 3.8 3.8  CL 105 105 108 107  CO2 26  --  24 24  GLUCOSE 130* 128* 132* 129*  BUN 17 20 26* 27*  CREATININE 1.05* 1.00 1.03* 0.89  CALCIUM 10.7*  --  8.8* 9.3   Liver Function Tests: Recent Labs  Lab 07/21/18 0358 07/23/18 0532 07/24/18 0505  AST 20 21 22   ALT 21 19 25   ALKPHOS 61 48 51  BILITOT 0.8 0.5 1.0  PROT 5.9* 5.2* 5.4*  ALBUMIN 3.6 3.0* 3.1*   No results for input(s): LIPASE, AMYLASE in the last 168 hours. No results for input(s): AMMONIA in the last 168 hours. CBC: Recent Labs  Lab 07/21/18 0358 07/21/18 0405  WBC 12.7*  --   NEUTROABS 10.0*  --   HGB 12.6 13.3  HCT 39.9 39.0  MCV 92.1  --   PLT 200  --    Cardiac Enzymes: No results for input(s): CKTOTAL, CKMB, CKMBINDEX, TROPONINI in the last 168 hours. BNP: BNP (last 3 results) Recent Labs    09/23/17 0215 10/04/17 0444  BNP 326.8* 115.8*    ProBNP (last 3 results) No results for input(s): PROBNP in the last 8760 hours.  CBG: No results for input(s): GLUCAP in the last 168  hours.     Signed:  Desiree Hane, MD [facility] 07/24/2018, 1:16 PM

## 2018-07-25 ENCOUNTER — Inpatient Hospital Stay (HOSPITAL_COMMUNITY): Payer: Medicare Other | Admitting: Physical Therapy

## 2018-07-25 ENCOUNTER — Inpatient Hospital Stay (HOSPITAL_COMMUNITY): Payer: Medicare Other | Admitting: Occupational Therapy

## 2018-07-25 DIAGNOSIS — G8194 Hemiplegia, unspecified affecting left nondominant side: Secondary | ICD-10-CM

## 2018-07-25 LAB — COMPREHENSIVE METABOLIC PANEL
ALT: 35 U/L (ref 0–44)
AST: 26 U/L (ref 15–41)
Albumin: 2.9 g/dL — ABNORMAL LOW (ref 3.5–5.0)
Alkaline Phosphatase: 50 U/L (ref 38–126)
Anion gap: 7 (ref 5–15)
BUN: 32 mg/dL — ABNORMAL HIGH (ref 8–23)
CO2: 24 mmol/L (ref 22–32)
Calcium: 9 mg/dL (ref 8.9–10.3)
Chloride: 106 mmol/L (ref 98–111)
Creatinine, Ser: 1 mg/dL (ref 0.44–1.00)
GFR calc Af Amer: >60 mL/min (ref >60)
GFR calc non Af Amer: 54 mL/min — ABNORMAL LOW (ref >60)
Glucose, Bld: 132 mg/dL — ABNORMAL HIGH (ref 70–99)
Potassium: 3.8 mmol/L (ref 3.5–5.1)
Sodium: 137 mmol/L (ref 135–145)
Total Bilirubin: 0.6 mg/dL (ref 0.3–1.2)
Total Protein: 5.1 g/dL — ABNORMAL LOW (ref 6.5–8.1)

## 2018-07-25 LAB — CBC WITH DIFFERENTIAL/PLATELET
Abs Immature Granulocytes: 0.1 10*3/uL — ABNORMAL HIGH (ref 0.00–0.07)
BASOS ABS: 0 10*3/uL (ref 0.0–0.1)
Basophils Relative: 0 %
Eosinophils Absolute: 0 10*3/uL (ref 0.0–0.5)
Eosinophils Relative: 0 %
HCT: 36.7 % (ref 36.0–46.0)
Hemoglobin: 12.2 g/dL (ref 12.0–15.0)
Immature Granulocytes: 1 %
Lymphocytes Relative: 8 %
Lymphs Abs: 0.8 10*3/uL (ref 0.7–4.0)
MCH: 30.3 pg (ref 26.0–34.0)
MCHC: 33.2 g/dL (ref 30.0–36.0)
MCV: 91.1 fL (ref 80.0–100.0)
Monocytes Absolute: 0.8 10*3/uL (ref 0.1–1.0)
Monocytes Relative: 7 %
Neutro Abs: 8.9 10*3/uL — ABNORMAL HIGH (ref 1.7–7.7)
Neutrophils Relative %: 84 %
Platelets: 201 10*3/uL (ref 150–400)
RBC: 4.03 MIL/uL (ref 3.87–5.11)
RDW: 13.3 % (ref 11.5–15.5)
WBC: 10.6 10*3/uL — ABNORMAL HIGH (ref 4.0–10.5)
nRBC: 0 % (ref 0.0–0.2)

## 2018-07-25 NOTE — Progress Notes (Signed)
[place] PHYSICAL MEDICINE & REHABILITATION PROGRESS NOTE   Subjective/Complaints: Sleep was fair, no new issues , left thigh pain with neg xray ROS- No CP, SOB, N/V/D  Objective:   Mr Jeri Cos Wo Contrast  Result Date: 07/23/2018 CLINICAL DATA:  Recurrent metastatic lung cancer stereotactic radiosurgery planning. EXAM: MRI HEAD WITHOUT AND WITH CONTRAST TECHNIQUE: Multiplanar, multiecho pulse sequences of the brain and surrounding structures were obtained without and with intravenous contrast. CONTRAST:  6 mL Gadavist COMPARISON:  Brain MRI 07/21/2018 FINDINGS: BRAIN: There are multiple contrast-enhancing lesions within the brain, consistent with parenchymal metastases, annotated on series 11: 1. Right cerebellar hemisphere, 2 mm, unchanged, image 56 2. Left cerebellar hemisphere, 14 mm, unchanged, image 62 3. Left temporal lobe, 12 mm, unchanged, image 66. Mild surrounding edema, unchanged. 4. Left pons, 8 mm, unchanged, image 70 5. Medial right temporal lobe, 7 mm, unchanged, image 78 6. Inferior left frontal lobe, 5 mm, unchanged, image 79 7. Right occipital lobe, 3 mm, not visible on the prior study (probably due to technical factors), image 93 8. Right frontal lobe, 20 mm, unchanged, image 111. Moderate surrounding edema, unchanged. 9. Posterior right parietal lobe, 8 mm, unchanged, image 120 10. Right parietal lobe postcentral sulcus, 15 mm, unchanged, image 140. Large amount of surrounding edema, unchanged. No acute infarct. No midline shift or herniation. Size and configuration of the ventricles are unchanged. VASCULAR: Major intracranial arterial and venous sinus flow voids are normal. SKULL AND UPPER CERVICAL SPINE: The bone marrow signal of the calvarium is normal. There are 2 left periorbital soft tissue lesions imbedded within the subcutaneous fat. The more inferior lesion measures 14 mm. The more superior lesion measures 13 mm. SINUSES/ORBITS: No fluid levels or advanced mucosal  thickening. No mastoid or middle ear effusion. The orbits are normal. IMPRESSION: 1. Unchanged appearance of 9 brain parenchymal metastases with edema greatest in the right parietal and frontal lobes. A 10th metastasis was not visible on the prior study, probably due to differences in slice thickness. 2. Unchanged appearance of 2 lesions within the left periorbital subcutaneous fat. 3. No acute abnormality. Electronically Signed   By: Ulyses Jarred M.D.   On: 07/23/2018 13:52   Recent Labs    07/25/18 0520  WBC 10.6*  HGB 12.2  HCT 36.7  PLT 201   Recent Labs    07/24/18 0505 07/25/18 0520  NA 140 137  K 3.8 3.8  CL 107 106  CO2 24 24  GLUCOSE 129* 132*  BUN 27* 32*  CREATININE 0.89 1.00  CALCIUM 9.3 9.0   No intake or output data in the 24 hours ending 07/25/18 0835   Physical Exam: Vital Signs Blood pressure (!) 166/82, pulse 77, temperature 97.6 F (36.4 C), temperature source Oral, resp. rate 18, height 5\' 3"  (1.6 m), weight 58.2 kg, SpO2 96 %.   General: No acute distress Mood and affect are appropriate Heart: Regular rate and rhythm no rubs murmurs or extra sounds Lungs: Clear to auscultation, breathing unlabored, no rales or wheezes Abdomen: Positive bowel sounds, soft nontender to palpation, nondistended Extremities: No clubbing, cyanosis, or edema Skin: No evidence of breakdown, no evidence of rash Neurologic: Cranial nerves II through XII intact, motor strength is 5/5 in RIght  deltoid, bicep, tricep, grip, hip flexor, knee extensors, ankle dorsiflexor and plantar flexor, 4/5 on left side   Musculoskeletal: Full range of motion in all 4 extremities. No joint swelling   Assessment/Plan: 1. Functional deficits secondary to Right parietal brain  metastases from non small cell lung Ca which require 3+ hours per day of interdisciplinary therapy in a comprehensive inpatient rehab setting.  Physiatrist is providing close team supervision and 24 hour management of  active medical problems listed below.  Physiatrist and rehab team continue to assess barriers to discharge/monitor patient progress toward functional and medical goals  Care Tool:  Bathing              Bathing assist       Upper Body Dressing/Undressing Upper body dressing        Upper body assist      Lower Body Dressing/Undressing Lower body dressing            Lower body assist       Toileting Toileting    Toileting assist Assist for toileting: Dependent - Patient 0%     Transfers Chair/bed transfer  Transfers assist     Chair/bed transfer assist level: 2 Helpers     Locomotion Ambulation   Ambulation assist              Walk 10 feet activity   Assist           Walk 50 feet activity   Assist           Walk 150 feet activity   Assist           Walk 10 feet on uneven surface  activity   Assist           Wheelchair     Assist               Wheelchair 50 feet with 2 turns activity    Assist            Wheelchair 150 feet activity     Assist            Medical Problem List and Plan: 1.Decreased functional mobility with left side weaknesssecondary to brain metastasisdue tonon-small cell lung cancer with right upper lobe lobectomy. Plan SRS radiation therapy per follow-up radiation oncology. Palliative care consult has been obtained -CIR evals 2. DVT Prophylaxis/Anticoagulation: SCD 3. Pain Management:Neurontin 600 mg daily at bedtime,Lodine 400 mg twice a day MSIR 7.5 mg every 4 hours as needed -pain left thigh no muscular tenderness check femur xray 4. Mood:Provide emotional support 5. Neuropsych: This patientiscapable of making decisions on herown behalf. 6. Skin/Wound Care:Routine skin checks 7. Fluids/Electrolytes/Nutrition:Routine in and out's with follow-up chemistries.  -encourage PO 8. Hypertension. Norvasc 10 mg  daily, Coreg 25 mg twice a day. Monitor with increased mobility Vitals:   07/24/18 1954 07/25/18 0537  BP: (!) 153/77 (!) 166/82  Pulse: 80 77  Resp: 19 18  Temp: 97.8 F (36.6 C) 97.6 F (36.4 C)  SpO2: 97% 26%  elevated systolic oncurrent  meds will monitor prior to resuming cozaar 9. Hypothyroidism. Synthroid 10. Hyperlipidemia. Lipitor    LOS: 1 days A FACE TO FACE EVALUATION WAS PERFORMED  Charlett Blake 07/25/2018, 8:35 AM

## 2018-07-25 NOTE — Progress Notes (Signed)
Per nursing, patient was given "Data Collection Information Summary for Patients in Inpatient Rehabilitation Facilities with attached Privacy Act Statement Health Care Records" upon admission.    Patient information reviewed and entered into [facility] System by Becky Minsa Weddington, PPS coordinator. Information including medical coding, function ability, and quality indicators will be reviewed and updated through discharge.   

## 2018-07-25 NOTE — Evaluation (Signed)
Occupational Therapy Assessment and Plan  Patient Details  Name: [name] MRN: [id number] Date of Birth: [birth year]  OT Diagnosis: hemiplegia affecting non-dominant side and muscle weakness (generalized) Rehab Potential: Rehab Potential (ACUTE ONLY): Good ELOS: 7-10 days   Today's Date: 07/25/2018 OT Individual Time: 6599-3570 OT Individual Time Calculation (min): 55 min     Problem List:  Patient Active Problem List   Diagnosis Date Noted  . Brain metastases ([place]) 07/24/2018  . Dyslipidemia   . Malignant neoplasm of right lung ([place])   . Leukocytosis   . Brain metastasis ([place]) 07/21/2018  . Left hemiparesis ([place]) 07/21/2018  . Lung cancer metastatic to brain ([place]) 07/21/2018  . Acute upper GI bleed   . Anemia due to acute blood loss   . Closed flail chest   . CAD (coronary artery disease) 09/21/2017  . Coronary stent thrombosis   . Endotracheal tube present   . CAD in native artery 09/20/2017  . Elevated serum creatinine 04/15/2016  . Acute kidney injury superimposed on chronic kidney disease ([place]) 03/24/2016  . Coronary artery calcification seen on CT scan   . CKD (chronic kidney disease), stage III ([place])   . Hypertension   . Hypothyroidism 03/15/2016  . Hyperlipidemia 08/19/2015  . Cancer of upper lobe of left lung ([place])   . Recurrent lung adenocarcinoma ([place]) 10/01/2014  . Local recurrence of left lung cancer ([place])   . Carotid artery disease without cerebral infarction ([place]) 05/20/2013  . Atherosclerosis of aorta ([place]) 05/10/2013  . Macular degeneration 05/10/2013  . Allergic rhinitis 10/20/2012  . Cancer of upper lobe of lung ([place]) 09/28/2011  . Melanoma ([place])   . LBBB (left bundle branch block)   . Gastritis due to nonsteroidal anti-inflammatory drug 06/20/2011  . Hyperthyroidism   . Tremor   . Abdominal pain, RUQ 02/10/2011  . GERD with stricture 02/08/2011  . GOITER, MULTINODULAR 09/25/2009  . URI 09/02/2009  . Essential hypertension, benign  12/01/2008    Past Medical History:  Past Medical History:  Diagnosis Date  . Anemia 09/2017  . Arthritis    back.and feet  . Cervical ca ([place]) dx'd 1988   surg only  . CKD (chronic kidney disease), stage III ([place])   . Complication of anesthesia    difficulty awakening, dysrhythmia post surgery, (prolonged sedation level)  . Coronary artery calcification seen on CT scan   . Dyspnea   . GERD (gastroesophageal reflux disease)   . Headache    occ. sinus type headaches  . History of hiatal hernia   . Hx of radiation therapy 09/16/13-09/30/13   prevascular lymph node  . Hyperlipidemia   . Hypertension   . Hyperthyroidism    dr Loanne Drilling- radioactive iodine treatment 2011- resolved  . Hypothyroidism   . LBBB (left bundle branch block)   . Lung cancer ([place])    Bilaterally, RUL lobectomy and spot involving both lungs- "cancer"  . Macular degeneration    bilateral  . Melanoma ([place])    left leg  . Radiation 10/11/11-10/21/11   Left upper lobe adenocarcinoma  . Tremor    hand- intention tremor   Past Surgical History:  Past Surgical History:  Procedure Laterality Date  . basal cell and squamous cell skin cell area removed     new left lower eye lid done with skin graft   . CARDIAC CATHETERIZATION  09/21/2017  . CATARACT EXTRACTION, BILATERAL Bilateral   . COLONOSCOPY WITH PROPOFOL N/A 10/27/2016   Procedure: COLONOSCOPY WITH PROPOFOL;  Surgeon: Laurence Spates, MD;  Location: [facility];  Service: Endoscopy;  Laterality: N/A;  . conization for cervical dysplasia    . CORONARY STENT INTERVENTION N/A 09/21/2017   Procedure: CORONARY STENT INTERVENTION;  Surgeon: Belva Crome, MD;  Location: [facility];  Service: Cardiovascular;  Laterality: N/A;  . CORONARY/GRAFT ACUTE MI REVASCULARIZATION N/A 09/21/2017   Procedure: Coronary/Graft Acute MI Revascularization;  Surgeon: Belva Crome, MD;  Location: [facility];  Service: Cardiovascular;  Laterality: N/A;  .  ESOPHAGOGASTRODUODENOSCOPY (EGD) WITH PROPOFOL N/A 09/28/2017   Procedure: ESOPHAGOGASTRODUODENOSCOPY (EGD) WITH PROPOFOL;  Surgeon: Arta Silence, MD;  Location: [facility];  Service: Endoscopy;  Laterality: N/A;  . ESOPHAGOGASTRODUODENOSCOPY (EGD) WITH PROPOFOL N/A 09/30/2017   Procedure: ESOPHAGOGASTRODUODENOSCOPY (EGD) WITH PROPOFOL;  Surgeon: Otis Brace, MD;  Location: [facility];  Service: Gastroenterology;  Laterality: N/A;  . esophogus stretching    . GIVENS CAPSULE STUDY N/A 10/06/2017   Procedure: GIVENS CAPSULE STUDY;  Surgeon: Arta Silence, MD;  Location: [facility]OSCOPY;  Service: Endoscopy;  Laterality: N/A;  . INTRAVASCULAR ULTRASOUND/IVUS N/A 09/21/2017   Procedure: Intravascular Ultrasound/IVUS;  Surgeon: Belva Crome, MD;  Location: [facility];  Service: Cardiovascular;  Laterality: N/A;  . IR GENERIC HISTORICAL  09/11/2014   IR RADIOLOGIST EVAL & MGMT 09/11/2014 Jacqulynn Cadet, MD [facility] INTERV RAD  . LEFT HEART CATH AND CORONARY ANGIOGRAPHY N/A 09/21/2017   Procedure: LEFT HEART CATH AND CORONARY ANGIOGRAPHY;  Surgeon: Belva Crome, MD;  Location: [facility];  Service: Cardiovascular;  Laterality: N/A;  . LEFT HEART CATH AND CORONARY ANGIOGRAPHY N/A 09/21/2017   Procedure: LEFT HEART CATH AND CORONARY ANGIOGRAPHY;  Surgeon: Belva Crome, MD;  Location: [facility];  Service: Cardiovascular;  Laterality: N/A;  . radio frequency ablation on lung spot    . rul lobectomy  2010  . TONSILLECTOMY    . TONSILLECTOMY      Assessment & Plan Clinical Impression: Patient is a 78 y.o. right-handed female with history of chronic renal insufficiency stage III, hypertension, hyperlipidemia, non-small cell lung cancer with right upper lobe lobectomy 2009 recurrence in 2013 that was treated with radiation therapy and followed by oncology services. Per chart review and patient, patient lives alone independent prior to admission for most tasks with assistance  of children and grandchildren for other tasks. She does have family in good support. Presented 07/21/2018 after recent fall with left arm weakness. MRI the brain reviewed, showing intra-and extracranial brain masses. Per report, showed at least 8 parenchymal metastasis, to left periorbital subcutaneous brain lesions with marked surrounding edema likely reflecting subcutaneous metastasis. Oncology services follow-up Dr. Jana Hakim with plan for [facility] radiation therapy which was also discussed with Dr. Julien Nordmann and Dr. Lisbeth Renshaw of radiation oncology. Therapy evaluations completed with recommendations of physical medicine rehabilitation consult. Patient was admitted for a comprehensive rehabilitation program.   Patient transferred to CIR on 07/24/2018 .    Patient currently requires min-mod with basic self-care skills secondary to muscle weakness, decreased cardiorespiratoy endurance, impaired timing and sequencing, unbalanced muscle activation and decreased coordination, decreased attention to left, decreased memory and decreased standing balance and decreased balance strategies.  Prior to hospitalization, patient could complete ADLs with independent .  Patient will benefit from skilled intervention to increase independence with basic self-care skills prior to discharge home with care partner.  Anticipate patient will require 24 hour supervision and follow up home health.  OT - End of Session Activity Tolerance: Tolerates 30+  min activity with multiple rests Endurance Deficit: Yes Endurance Deficit Description: 2/2 generalized deconditioning, and decreased cardiopulmonary endurance OT Assessment Rehab Potential (ACUTE ONLY): Good OT Patient demonstrates impairments in the following area(s): Balance;Endurance;Motor;Perception;Safety;Sensory;Skin Integrity OT Basic ADL's Functional Problem(s): Eating;Grooming;Bathing;Dressing;Toileting OT Advanced ADL's Functional Problem(s): Simple Meal Preparation OT Transfers  Functional Problem(s): Toilet;Tub/Shower OT Additional Impairment(s): Fuctional Use of Upper Extremity OT Plan OT Intensity: Minimum of 1-2 x/day, 45 to 90 minutes OT Frequency: 5 out of 7 days OT Duration/Estimated Length of Stay: 7-10 days OT Treatment/Interventions: Balance/vestibular training;Discharge planning;Disease Lawyer;Functional mobility training;Neuromuscular re-education;Pain management;Patient/family education;Psychosocial support;Self Care/advanced ADL retraining;Skin care/wound managment;Therapeutic Activities;Therapeutic Exercise;UE/LE Strength taining/ROM;UE/LE Coordination activities OT Self Feeding Anticipated Outcome(s): Setup OT Basic Self-Care Anticipated Outcome(s): Supervision OT Toileting Anticipated Outcome(s): Supervision OT Bathroom Transfers Anticipated Outcome(s): Supervision OT Recommendation Recommendations for Other Services: Therapeutic Recreation consult Patient destination: Home Follow Up Recommendations: Home health OT;24 hour supervision/assistance Equipment Recommended: None recommended by OT Equipment Details: pt has all equipment   Skilled Therapeutic Intervention OT eval completed with discussion of rehab process, OT purpose, POC, ELOS, and goals.  Engaged in ADL assessment with bathing and dressing at sit > stand level at sink.  Therapist providing mod cues for attention to LUE and educated on hemi-technique for bathing/dressing as well as visually attending to LUE during self-care tasks.  Min assist sit > stand and for standing balance while pt attempted to pull underwear and pants over hips.  Pt required assistance to pull pants over hips.  Pt able to utilize LUE as diminished level during bathing and dressing with increased time and effort.  Pt left upright in w/c with seat belt alarm on and all needs in reach.  OT Evaluation Precautions/Restrictions  Precautions Precautions:  Fall Restrictions Weight Bearing Restrictions: No General   Vital Signs Therapy Vitals BP: (!) 152/80 Pain Pain Assessment Pain Scale: 0-10 Pain Score: 0-No pain Home Living/Prior Functioning Home Living Family/patient expects to be discharged to:: Private residence Living Arrangements: Alone Available Help at Discharge: (family PRN & pt has long term care policy & can have 24 hr assist from agency at d/c if needed) Type of Home: House Home Access: Stairs to enter [employer] of Steps: 2 Entrance Stairs-Rails: Right Home Layout: Two level, Able to live on main level with bedroom/bathroom Bathroom Shower/Tub: Multimedia programmer: Handicapped height Additional Comments: Pt lives alone but has family who can stay with her at d/c if needed and has long term policy that will provide supervision  Lives With: Alone IADL History Homemaking Responsibilities: Yes Meal Prep Responsibility: Primary Laundry Responsibility: Primary Cleaning Responsibility: Primary Bill Paying/Finance Responsibility: Primary Shopping Responsibility: Primary Prior Function Level of Independence: Independent with basic ADLs, Independent with transfers, Independent with gait, Independent with homemaking with ambulation  Able to Take Stairs?: Yes Driving: Yes Vocation: Volunteer work Biomedical scientist: volunteers with [employer] Leisure: Hobbies-yes (Comment) Comments: enjoys tennis ADL ADL Grooming: Minimal assistance Where Assessed-Grooming: Standing at sink Upper Body Bathing: Minimal cueing, Setup Where Assessed-Upper Body Bathing: Sitting at sink Lower Body Bathing: Moderate assistance Where Assessed-Lower Body Bathing: Sitting at sink, Standing at sink Upper Body Dressing: Minimal cueing, Supervision/safety Where Assessed-Upper Body Dressing: Sitting at sink Lower Body Dressing: Minimal cueing, Moderate assistance Where Assessed-Lower Body Dressing: Sitting at  sink, Standing at sink Toilet Transfer: Minimal assistance Toilet Transfer Method: Stand pivot Toilet Transfer Equipment: Grab bars Vision Baseline Vision/History: Wears glasses;Macular Degeneration Wears Glasses: Reading only Patient Visual Report: No change from baseline Vision  Assessment?: No apparent visual deficits Perception  Perception: Impaired Inattention/Neglect: Does not attend to left side of body(mild impairment) Cognition Overall Cognitive Status: Within Functional Limits for tasks assessed Arousal/Alertness: Awake/alert Orientation Level: Person;Place;Situation Person: Oriented Place: Oriented Situation: Oriented Year: 2020 Month: January Day of Week: Correct Memory: Appears intact Immediate Memory Recall: Sock;Blue;Bed Memory Recall: Blue;Bed;Sock Memory Recall Sock: With Cue Memory Recall Blue: Without Cue Memory Recall Bed: Without Cue Attention: Selective;Alternating Selective Attention: Appears intact Awareness: Appears intact Problem Solving: Appears intact Safety/Judgment: Appears intact Sensation Sensation Light Touch: Impaired Detail Light Touch Impaired Details: Impaired LUE(decreased in hand and absent in fingertips) Proprioception: Impaired Detail Proprioception Impaired Details: Impaired LUE Coordination Gross Motor Movements are Fluid and Coordinated: No(impaired LUE, LLE) Fine Motor Movements are Fluid and Coordinated: No(impaired LUE) Finger Nose Finger Test: impaired on Lt, dysmetria Motor  Motor Motor: Abnormal postural alignment and control Motor - Skilled Clinical Observations: hemiparesis (LUE>LLE), generalized deconditioning Mobility  Bed Mobility Bed Mobility: Supine to Sit Supine to Sit: Supervision/Verbal cueing(HOB elevated, bed rails, extra time) Transfers Sit to Stand: Minimal Assistance - Patient > 75% Stand to Sit: Minimal Assistance - Patient > 75%  Trunk/Postural Assessment  Postural Control Postural Control:  Deficits on evaluation Righting Reactions: impaired Protective Responses: impaired  Balance Balance Balance Assessed: Yes Static Sitting Balance Static Sitting - Balance Support: Feet unsupported;No upper extremity supported Static Sitting - Level of Assistance: 5: Stand by assistance Dynamic Sitting Balance Dynamic Sitting - Balance Support: Feet unsupported;No upper extremity supported Dynamic Sitting - Level of Assistance: 5: Stand by assistance Dynamic Standing Balance Dynamic Standing - Balance Support: No upper extremity supported;During functional activity(during gait without AD) Dynamic Standing - Level of Assistance: 3: Mod assist Extremity/Trunk Assessment RUE Assessment RUE Assessment: Within Functional Limits LUE Assessment LUE Assessment: Exceptions to [facility] Passive Range of Motion (PROM) Comments: WNL Active Range of Motion (AROM) Comments: shoulder flexion to 120*, full elbow flexion/extension, and loose grasp General Strength Comments: Strength grossly 2-3/5 proximal to distal.  Loose gross grasp     Refer to Care Plan for Long Term Goals  Recommendations for other services: Therapeutic Recreation  Stress management and Other emotional support   Discharge Criteria: Patient will be discharged from OT if patient refuses treatment 3 consecutive times without medical reason, if treatment goals not met, if there is a change in medical status, if patient makes no progress towards goals or if patient is discharged from hospital.  The above assessment, treatment plan, treatment alternatives and goals were discussed and mutually agreed upon: by patient  Simonne Come 07/25/2018, 12:22 PM

## 2018-07-25 NOTE — Evaluation (Addendum)
Physical Therapy Assessment and Plan  Patient Details  Name: [name] MRN: [id number] Date of Birth: 12-10-1940  PT Diagnosis: Abnormality of gait, Coordination disorder, Difficulty walking, Hemiparesis non-dominant, Muscle weakness and Pain in LLE Rehab Potential: Good ELOS: 7-10 days   Today's Date: 07/25/2018 PT Individual Time: 0805-0902 PT Individual Time Calculation (min): 57 min    Problem List:  Patient Active Problem List   Diagnosis Date Noted  . Brain metastases ([place]) 07/24/2018  . Dyslipidemia   . Malignant neoplasm of right lung ([place])   . Leukocytosis   . Brain metastasis ([employer]) 07/21/2018  . Left hemiparesis ([place]) 07/21/2018  . Lung cancer metastatic to brain ([place]) 07/21/2018  . Acute upper GI bleed   . Anemia due to acute blood loss   . Closed flail chest   . CAD (coronary artery disease) 09/21/2017  . Coronary stent thrombosis   . Endotracheal tube present   . CAD in native artery 09/20/2017  . Elevated serum creatinine 04/15/2016  . Acute kidney injury superimposed on chronic kidney disease ([place]) 03/24/2016  . Coronary artery calcification seen on CT scan   . CKD (chronic kidney disease), stage III ([place])   . Hypertension   . Hypothyroidism 03/15/2016  . Hyperlipidemia 08/19/2015  . Cancer of upper lobe of left lung ([place])   . Recurrent lung adenocarcinoma ([place]) 10/01/2014  . Local recurrence of left lung cancer ([place])   . Carotid artery disease without cerebral infarction ([place]) 05/20/2013  . Atherosclerosis of aorta ([place]) 05/10/2013  . Macular degeneration 05/10/2013  . Allergic rhinitis 10/20/2012  . Cancer of upper lobe of lung ([place]) 09/28/2011  . Melanoma ([place])   . LBBB (left bundle branch block)   . Gastritis due to nonsteroidal anti-inflammatory drug 06/20/2011  . Hyperthyroidism   . Tremor   . Abdominal pain, RUQ 02/10/2011  . GERD with stricture 02/08/2011  . GOITER, MULTINODULAR 09/25/2009  . URI 09/02/2009  . Essential hypertension,  benign 12/01/2008    Past Medical History:  Past Medical History:  Diagnosis Date  . Anemia 09/2017  . Arthritis    back.and feet  . Cervical ca ([place]) dx'd 1988   surg only  . CKD (chronic kidney disease), stage III ([place])   . Complication of anesthesia    difficulty awakening, dysrhythmia post surgery, (prolonged sedation level)  . Coronary artery calcification seen on CT scan   . Dyspnea   . GERD (gastroesophageal reflux disease)   . Headache    occ. sinus type headaches  . History of hiatal hernia   . Hx of radiation therapy 09/16/13-09/30/13   prevascular lymph node  . Hyperlipidemia   . Hypertension   . Hyperthyroidism    dr Loanne Drilling- radioactive iodine treatment 2011- resolved  . Hypothyroidism   . LBBB (left bundle branch block)   . Lung cancer ([place])    Bilaterally, RUL lobectomy and spot involving both lungs- "cancer"  . Macular degeneration    bilateral  . Melanoma ([place])    left leg  . Radiation 10/11/11-10/21/11   Left upper lobe adenocarcinoma  . Tremor    hand- intention tremor   Past Surgical History:  Past Surgical History:  Procedure Laterality Date  . basal cell and squamous cell skin cell area removed     new left lower eye lid done with skin graft   . CARDIAC CATHETERIZATION  09/21/2017  . CATARACT EXTRACTION, BILATERAL Bilateral   . COLONOSCOPY WITH PROPOFOL N/A 10/27/2016   Procedure: COLONOSCOPY WITH  PROPOFOL;  Surgeon: Laurence Spates, MD;  Location: [facility];  Service: Endoscopy;  Laterality: N/A;  . conization for cervical dysplasia    . CORONARY STENT INTERVENTION N/A 09/21/2017   Procedure: CORONARY STENT INTERVENTION;  Surgeon: Belva Crome, MD;  Location: [place] [facility];  Service: Cardiovascular;  Laterality: N/A;  . CORONARY/GRAFT ACUTE MI REVASCULARIZATION N/A 09/21/2017   Procedure: Coronary/Graft Acute MI Revascularization;  Surgeon: Belva Crome, MD;  Location: [place] [facility];  Service: Cardiovascular;  Laterality: N/A;  .  ESOPHAGOGASTRODUODENOSCOPY (EGD) WITH PROPOFOL N/A 09/28/2017   Procedure: ESOPHAGOGASTRODUODENOSCOPY (EGD) WITH PROPOFOL;  Surgeon: Arta Silence, MD;  Location: [place] [facility];  Service: Endoscopy;  Laterality: N/A;  . ESOPHAGOGASTRODUODENOSCOPY (EGD) WITH PROPOFOL N/A 09/30/2017   Procedure: ESOPHAGOGASTRODUODENOSCOPY (EGD) WITH PROPOFOL;  Surgeon: Otis Brace, MD;  Location: [facility];  Service: Gastroenterology;  Laterality: N/A;  . esophogus stretching    . GIVENS CAPSULE STUDY N/A 10/06/2017   Procedure: GIVENS CAPSULE STUDY;  Surgeon: Arta Silence, MD;  Location[facility] ENDOSCOPY;  Service: Endoscopy;  Laterality: N/A;  . INTRAVASCULAR ULTRASOUND/IVUS N/A 09/21/2017   Procedure: Intravascular Ultrasound/IVUS;  Surgeon: Belva Crome, MD;  Location: [place] [facility];  Service: Cardiovascular;  Laterality: N/A;  . IR GENERIC HISTORICAL  09/11/2014   IR RADIOLOGIST EVAL & MGMT 09/11/2014 Jacqulynn Cadet, MD [facility] INTERV RAD  . LEFT HEART CATH AND CORONARY ANGIOGRAPHY N/A 09/21/2017   Procedure: LEFT HEART CATH AND CORONARY ANGIOGRAPHY;  Surgeon: Belva Crome, MD;  Location: [place] [facility];  Service: Cardiovascular;  Laterality: N/A;  . LEFT HEART CATH AND CORONARY ANGIOGRAPHY N/A 09/21/2017   Procedure: LEFT HEART CATH AND CORONARY ANGIOGRAPHY;  Surgeon: Belva Crome, MD;  Location: [place] [facility];  Service: Cardiovascular;  Laterality: N/A;  . radio frequency ablation on lung spot    . rul lobectomy  2010  . TONSILLECTOMY    . TONSILLECTOMY      Assessment & Plan Clinical Impression: Patient is a 78 y.o. year old female with history of chronic renal insufficiency stage III, hypertension, hyperlipidemia, non-small cell lung cancer with right upper lobe lobectomy 2009 recurrence in 2013 that was treated with radiation therapy and followed by oncology services. Per chart review and patient, patient lives alone independent prior to admission for most tasks with assistance of  children and grandchildren for other tasks. She does have family in good support. Presented 07/21/2018 after recent fall with left arm weakness. MRI the brain reviewed, showing intra-and extracranial brain masses. Per report, showed at least 8 parenchymal metastasis, to left periorbital subcutaneous brain lesions with marked surrounding edema likely reflecting subcutaneous metastasis. Oncology services follow-up Dr. Jana Hakim with plan for [facility] radiation therapy which was also discussed with Dr. Julien Nordmann and Dr. Lisbeth Renshaw of radiation oncology. Therapy evaluations completed with recommendations of physical medicine rehabilitation consult. Patient was admitted for a comprehensive rehabilitation program..  Patient transferred to CIR on 07/24/2018 .   Patient currently requires mod with mobility secondary to muscle weakness, decreased cardiorespiratoy endurance, decreased coordination, decreased attention to left, and decreased standing balance, decreased postural control, hemiplegia and decreased balance strategies.  Prior to hospitalization, patient was independent  with mobility and lived with Alone in a House home.  Home access is 2Stairs to enter.  Patient will benefit from skilled PT intervention to maximize safe functional mobility, minimize fall risk and decrease caregiver burden for planned discharge home with 24 hour supervision.  Anticipate patient will benefit from follow up [facility]  at discharge.  PT - End of Session Activity Tolerance: Tolerates 30+ min activity with multiple rests Endurance Deficit: Yes Endurance Deficit Description: 2/2 generalized deconditioning, and decreased cardiopulmonary endurance PT Assessment Rehab Potential (ACUTE/IP ONLY): Good PT Barriers to Discharge: Medical stability PT Patient demonstrates impairments in the following area(s): Balance;Pain;Perception;Safety;Endurance;Edema;Sensory;Motor;Skin Integrity PT Transfers Functional Problem(s): Bed Mobility;Bed to  Chair;Car;Furniture;Floor PT Locomotion Functional Problem(s): Stairs;Ambulation PT Plan PT Intensity: Minimum of 1-2 x/day ,45 to 90 minutes PT Frequency: 5 out of 7 days PT Duration Estimated Length of Stay: 10-12 days PT Treatment/Interventions: Ambulation/gait training;Cognitive remediation/compensation;Discharge planning;DME/adaptive equipment instruction;Functional mobility training;Pain management;Psychosocial support;Splinting/orthotics;Therapeutic Activities;UE/LE Strength taining/ROM;Visual/perceptual remediation/compensation;Wheelchair propulsion/positioning;UE/LE Coordination activities;Therapeutic Exercise;Stair training;Patient/family education;Neuromuscular re-education;Functional electrical stimulation;Disease management/prevention;Community reintegration;Balance/vestibular training;Skin care/wound management PT Transfers Anticipated Outcome(s): supervision with LRAd PT Locomotion Anticipated Outcome(s): supervision with LRAD except stairs with 1 rail & CGA PT Recommendation Recommendations for Other Services: Neuropsych consult;Therapeutic Recreation consult Therapeutic Recreation Interventions: Pet therapy;Kitchen group;Stress management;Outing/community reintergration Follow Up Recommendations: Home health PT;24 hour supervision/assistance Patient destination: Home Equipment Recommended: To be determined  Skilled Therapeutic Intervention Patient received in bed & agreeable to tx. Pt provided PLOF information. Educated pt on daily therapy schedule, weekly interdisciplinary team meeting, safety plan in room (only staff assist her with OOB activity), and other CIR information. Provided pt with smaller w/c for improved positioning when OOB. Pt transfers to EOB with supervision and hospital bed features and completes sit<>stand and stand pivot transfers (bed>w/c and w/c<>car at small SUV simulated height) with min assist and cuing for sequencing. Pt ambulates 71 ft without AD & mod  assist with L lateral lean, decreased weight shifting R, & decreased step length LLE. Pt with slight decreased attention to LUE>LLE throughout session. Pt very motivated to participate and wants to focus on functional use of LUE. At end of session pt left sitting in w/c in room with chair alarm donned & all needs in reach.   Addendum: After gait pt reports labored breathing (reports this is baseline) & HR = 70 bpm, SpO2 = 100% on room air.  Pain: pt c/o soreness in L thigh & MD made aware during session.  PT Evaluation Precautions/Restrictions Precautions Precautions: Fall Restrictions Weight Bearing Restrictions: No  General Chart Reviewed: Yes Additional Pertinent History: chronic renal insufficiency stage 3, HTN, HLD, non small cell lung CA & R upper lobe lobectomy, arthritis, cervical CA, LBBB, macular degeneration Response to Previous Treatment: Not applicable Family/Caregiver Present: No  Home Living/Prior Functioning Home Living Available Help at Discharge: (family PRN & pt has long term care policy & can have 24 hr assist from agency at d/c if needed) Type of Home: House Home Access: Stairs to enter [employer] of Steps: 2 Entrance Stairs-Rails: Right Home Layout: Two level;Able to live on main level with bedroom/bathroom  Lives With: Alone Prior Function Level of Independence: Independent with basic ADLs;Independent with transfers;Independent with gait;Independent with homemaking with ambulation  Able to Take Stairs?: Yes Driving: Yes 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 Pt denies changes in baseline vision.  Slight decreased attention to LUE/LLE.  Cognition Overall Cognitive Status: Within Functional Limits for tasks assessed Orientation Level: Oriented  X4 Memory: Appears intact Awareness: Appears intact Problem Solving: Appears intact Safety/Judgment: Appears intact  Sensation Coordination Gross Motor Movements are Fluid and Coordinated: No(impaired LUE, LLE) Fine Motor Movements are Fluid and Coordinated: No(impaired LUE)  Motor  Motor Motor: Abnormal postural alignment and control Motor - Skilled Clinical Observations: hemiparesis (LUE>LLE), generalized deconditioning   Mobility Bed Mobility Bed Mobility: Supine to Sit Supine to Sit: Supervision/Verbal cueing(HOB elevated, bed rails, extra time) Transfers Transfers: Sit to Stand;Stand to Sit;Stand Pivot Transfers Sit to Stand: Minimal Assistance - Patient > 75% Stand to Sit: Minimal Assistance - Patient > 75% Stand Pivot Transfers: Minimal Assistance - Patient > 75% Stand Pivot Transfer Details: Verbal cues for technique;Verbal cues for sequencing   Locomotion  Gait Ambulation: Yes Gait Assistance: Moderate Assistance - Patient 50-74% Gait Distance (Feet): 85 Feet Assistive device: None Gait Assistance Details: Manual facilitation for weight shifting Gait Gait: Yes Gait Pattern: Decreased stance time - right;Decreased step length - left;Decreased step length - right;Decreased stride length;Decreased dorsiflexion - left;Decreased hip/knee flexion - left;Lateral trunk lean to left(decreased weight shifting R) Gait velocity: decreased Stairs / Additional Locomotion Stairs: No Wheelchair Mobility Wheelchair Mobility: No   Trunk/Postural Assessment  Postural Control Postural Control: Deficits on evaluation Righting Reactions: impaired Protective Responses: impaired   Balance Balance Balance Assessed: Yes Static Sitting Balance Static Sitting - Balance Support: Feet unsupported;No upper extremity supported Static Sitting - Level of Assistance: 5: Stand by assistance Dynamic Sitting Balance Dynamic Sitting - Balance Support: Feet unsupported;No upper extremity  supported Dynamic Sitting - Level of Assistance: 5: Stand by assistance Dynamic Standing Balance Dynamic Standing - Balance Support: No upper extremity supported;During functional activity(during gait without AD) Dynamic Standing - Level of Assistance: 3: Mod assist   Extremity Assessment  RUE Assessment RUE Assessment: Within Functional Limits Pt with WFL shoulder flexion and elbow flexion but with difficulty coordinating movements to bring hand to mouth for functional tasks (scractching nose, holding cup, etc.)  RLE Assessment RLE Assessment: Within Functional Limits LLE Assessment LLE Assessment: Within Functional Limits    Refer to Care Plan for Long Term Goals  Recommendations for other services: Neuropsych and Therapeutic Recreation  Pet therapy, Kitchen group, Stress management and Outing/community reintegration  Discharge Criteria: Patient will be discharged from PT if patient refuses treatment 3 consecutive times without medical reason, if treatment goals not met, if there is a change in medical status, if patient makes no progress towards goals or if patient is discharged from hospital.  The above assessment, treatment plan, treatment alternatives and goals were discussed and mutually agreed upon: by patient  Waunita Schooner 07/25/2018, 12:12 PM

## 2018-07-25 NOTE — Consult Note (Signed)
Consultation Note Date: 07/25/2018   Patient Name: [name]  DOB: 1940/10/05  MRN: [id number]  Age / Sex: 78 y.o., female  PCP: Wenda Low, MD Referring Physician: Charlett Blake, MD  Reason for Consultation: Establishing goals of care and Psychosocial/spiritual support  HPI/Patient Profile: 78 y.o. female  admitted on 07/24/2018 with past medcial history of chronic renal insufficiency stage III, hypertension, hyperlipidemia, non-small cell lung cancer with right upper lobe lobectomy 2009 recurrence in 2013 that was treated with radiation therapy and followed by oncology services.   Prior to this admission patient lives alone independently, her children live nearby and she has strong family support.   Presented 07/21/2018 after recent fall with left arm weakness. MRI the brain reviewed, showing intra-and extracranial brain masses. Per report, showed at least 8 parenchymal metastasis, to left periorbital subcutaneous brain lesions with marked surrounding edema likely reflecting subcutaneous metastasis.  He has  significant left bruising around the eye socket  Radiation  Oncology services are with Dr Lisbeth Renshaw and Worthy Flank PA-C,   plan for [facility] radiation therapy.  She had simulation yesterday and treatment is scheduled for Wednesday the 22nd.  She currently is in CIR for comprehensive physical medicine and rehabilitation.  Patient and family understand the seriousness of her current medical situation.  She speaks to "knowing all this all too well" her husband died from a cancer diagnosis approximately 18 months ago.   Patient and family face treatment option decisions, advanced directive decisions and anticipatory care needs  Clinical Assessment and Goals of Care:  This NP Wadie Lessen reviewed medical records, received report from team, assessed the patient and then meet at the patient's  bedside along with her son/ Pola Corn and daughter/Krista Stephens November   to discuss diagnosis, prognosis, GOC, EOL wishes disposition and options.   Discussed the role of palliative medicine and a holistic treatment plan.  Concept of Hospice and Palliative Care were discussed.  Again patient and family are familiar with hospice services having firsthand experience.    A detailed discussion was had today regarding advanced directives.  Concepts specific to code status, artifical feeding and hydration, continued IV antibiotics and rehospitalization was had.  MOST form was completed and and copy is placed in chart for scanning  Discussed with patient the importance of continued conversation with her family and her medical providers regarding overall plan of care and treatment options,  ensuring decisions are within the context of the patients values and GOCs.  She understands and was very clear with her family that quality of life was most important  The difference between a aggressive medical intervention path  and a palliative comfort care path for this patient at this time was had.  Values and goals of care important to patient and family were attempted to be elicited.    Questions and concerns addressed.   Family encouraged to call with questions or concerns.    PMT will continue to support holistically.    HCPOA/ son    SUMMARY OF RECOMMENDATIONS  At this time patient and her family are open to all offered and available medical interventions to prolong life.  She is hopeful for improvement and continued quality of life, returning to her home with added in-home services.  Radiation is scheduled for next week 08-02-17   Will check for f/u appointment with oncology  Code Status/Advance Care Planning:  DNR   Palliative Prophylaxis:   Bowel Regimen, Delirium Protocol, Frequent Pain Assessment and Oral Care  Additional Recommendations (Limitations, Scope, Preferences):  Full Scope  Treatment   Education regarding importance of hydration and diet in overall care plan/wellness  Psycho-social/Spiritual:   Desire for further Chaplaincy support:no  Additional Recommendations: Education on Hospice  Prognosis:   Unable to determine  Discharge Planning:   To Be Determined   Patient is hopeful to return home with added in-home services.  Family is already looking into services through [facility]. Recommend Life Alert for safety   I recommended community-based palliative services, at this point family declined but will consider in the future.  I encouraged patient and family to call me into the future with questions or concerns, I will assist to the best of my ability.     Primary Diagnoses: Present on Admission: . Brain metastases ([place])   I have reviewed the medical record, interviewed the patient and family, and examined the patient. The following aspects are pertinent.  Past Medical History:  Diagnosis Date  . Anemia 09/2017  . Arthritis    back.and feet  . Cervical ca ([place]) dx'd 1988   surg only  . CKD (chronic kidney disease), stage III ([place])   . Complication of anesthesia    difficulty awakening, dysrhythmia post surgery, (prolonged sedation level)  . Coronary artery calcification seen on CT scan   . Dyspnea   . GERD (gastroesophageal reflux disease)   . Headache    occ. sinus type headaches  . History of hiatal hernia   . Hx of radiation therapy 09/16/13-09/30/13   prevascular lymph node  . Hyperlipidemia   . Hypertension   . Hyperthyroidism    dr Loanne Drilling- radioactive iodine treatment 2011- resolved  . Hypothyroidism   . LBBB (left bundle branch block)   . Lung cancer ([place])    Bilaterally, RUL lobectomy and spot involving both lungs- "cancer"  . Macular degeneration    bilateral  . Melanoma ([place])    left leg  . Radiation 10/11/11-10/21/11   Left upper lobe adenocarcinoma  . Tremor    hand- intention tremor   Social History    Socioeconomic History  . Marital status: Widowed    Spouse name: Not on file  . Number of children: 2  . Years of education: Not on file  . Highest education level: Not on file  Occupational History  . Occupation: homemaker    Employer: RETIRED  Social Needs  . Financial resource strain: Not on file  . Food insecurity:    Worry: Not on file    Inability: Not on file  . Transportation needs:    Medical: Not on file    Non-medical: Not on file  Tobacco Use  . Smoking status: Former Smoker    Packs/day: 1.00    Years: 17.00    Pack years: 17.00    Types: Cigarettes    Last attempt to quit: 09/28/1975    Years since quitting: 42.8  . Smokeless tobacco: Never Used  . Tobacco comment: 33 yrs ago  Substance and Sexual Activity  . Alcohol use: Yes  Comment: glass wine daily  . Drug use: No  . Sexual activity: Not on file  Lifestyle  . Physical activity:    Days per week: Not on file    Minutes per session: Not on file  . Stress: Not on file  Relationships  . Social connections:    Talks on phone: Not on file    Gets together: Not on file    Attends religious service: Not on file    Active member of club or organization: Not on file    Attends meetings of clubs or organizations: Not on file    Relationship status: Widowed  Other Topics Concern  . Not on file  Social History Narrative   [employer] graduate   Married [place], Widowed 2019   4 grandchildren and 2 step-grandchildren         Physician Roster:   Oncologist- Dr Julien Nordmann   Surgeon- Dr Arlyce Dice   Gyn- Dr Jodelle [facility]   Family History  Problem Relation Age of Onset  . Asthma Mother   . Heart disease Mother   . Thyroid disease Daughter        hypothyroidism  . Cancer Maternal Uncle        lung  . Cancer Maternal Grandfather        lung  . Diabetes Neg Hx   . Coronary artery disease Neg Hx    Scheduled Meds: . acetaminophen  650 mg Oral Q6H  . amLODipine  10 mg Oral Daily  .  atorvastatin  80 mg Oral QHS  . calcium-vitamin D  2 tablet Oral Q breakfast  . carvedilol  25 mg Oral BID  . dexamethasone  4 mg Oral Q6H  . etodolac  400 mg Oral BID  . gabapentin  600 mg Oral QHS  . levothyroxine  88 mcg Oral QAC breakfast  . losartan  50 mg Oral BID  . multivitamin with minerals  1 tablet Oral Daily  . pantoprazole  40 mg Oral BID   Continuous Infusions: PRN Meds:.ketotifen, morphine, nitroGLYCERIN, ondansetron, sorbitol Medications Prior to Admission:  Prior to Admission medications   Medication Sig Start Date End Date Taking? Authorizing Provider  acetaminophen (TYLENOL) 650 MG CR tablet Take 2 tablets (1,300 mg total) by mouth 2 (two) times daily. 07/24/18   Oretha Milch D, MD  amLODipine (NORVASC) 10 MG tablet Take 1 tablet (10 mg total) by mouth daily. 07/24/18   Desiree Hane, MD  atorvastatin (LIPITOR) 80 MG tablet Take 1 tablet (80 mg total) by mouth daily. 07/24/18 10/22/18  Oretha Milch D, MD  Calcium Carbonate-Vitamin D3 (CALCIUM 600-D) 600-400 MG-UNIT TABS Take 1 tablet by mouth every morning. 07/24/18   Desiree Hane, MD  carvedilol (COREG) 25 MG tablet Take 1 tablet (25 mg total) by mouth 2 (two) times daily. 07/24/18 07/24/19  Desiree Hane, MD  dexamethasone (DECADRON) 4 MG tablet Take 1 tablet (4 mg total) by mouth every 6 (six) hours. 07/24/18   Desiree Hane, MD  DM-APAP-CPM (CORICIDIN HBP FLU PO) Take 2 tablets by mouth every 6 (six) hours as needed (flu symptoms).    [provider]  etodolac (LODINE) 400 MG tablet Take 1 tablet (400 mg total) by mouth 2 (two) times daily. 07/24/18   Desiree Hane, MD  fluticasone (FLONASE) 50 MCG/ACT nasal spray Place 1 spray into both nostrils daily as needed for allergies. 07/24/18   Desiree Hane, MD  gabapentin (NEURONTIN) 300 MG  capsule Take 2 capsules (600 mg total) by mouth at bedtime. 07/24/18   Desiree Hane, MD  ketotifen (ZADITOR) 0.025 % ophthalmic solution Place 1 drop into the  right eye 2 (two) times daily as needed (irritation). 07/24/18   Desiree Hane, MD  levothyroxine (SYNTHROID, LEVOTHROID) 88 MCG tablet Take 1 tablet (88 mcg total) by mouth daily before breakfast. 07/24/18   Oretha Milch D, MD  losartan (COZAAR) 50 MG tablet Take 1 tablet (50 mg total) by mouth 2 (two) times daily. 07/24/18   Oretha Milch D, MD  morphine (MSIR) 15 MG tablet Take 0.5 tablets (7.5 mg total) by mouth every 4 (four) hours as needed for severe pain. 07/24/18   Desiree Hane, MD  Multiple Vitamin (MULTIVITAMIN WITH MINERALS) TABS tablet Take 1 tablet by mouth daily. 07/24/18   Desiree Hane, MD  Multiple Vitamins-Minerals (PRESERVISION AREDS 2 PO) Take 1 capsule by mouth at bedtime.     [provider]  nitroGLYCERIN (NITROSTAT) 0.4 MG SL tablet Place 1 tablet (0.4 mg total) under the tongue every 5 (five) minutes as needed for chest pain. 07/24/18   Desiree Hane, MD  ondansetron (ZOFRAN ODT) 4 MG disintegrating tablet Take 1 tablet (4 mg total) by mouth every 8 (eight) hours as needed for nausea or vomiting. 07/24/18   Desiree Hane, MD  pantoprazole (PROTONIX) 40 MG tablet Take 1 tablet (40 mg total) by mouth 2 (two) times daily. 07/24/18   Desiree Hane, MD  Polyethyl Glycol-Propyl Glycol (SYSTANE) 0.4-0.3 % SOLN Apply 1 drop to eye daily as needed. 07/24/18   Desiree Hane, MD  promethazine (PHENERGAN) 25 MG tablet Take 1 tablet (25 mg total) by mouth every 6 (six) hours as needed for nausea or vomiting. 07/24/18   Desiree Hane, MD   Allergies  Allergen Reactions  . Meperidine Hcl Other (See Comments)    "knocked pt out for 4 days"  . Morphine Other (See Comments)    Stopped breathing due to too high of a dose per patient.  Not an allergic reaction to morphine  . Penicillins Anaphylaxis, Shortness Of Breath and Other (See Comments)    Difficulty breathing  . Oxycodone-Acetaminophen Itching       . Propoxyphene N-Acetaminophen Nausea And Vomiting  .  Tramadol Hcl Nausea And Vomiting   Review of Systems  Neurological: Positive for weakness.    Physical Exam Constitutional:      Appearance: She is ill-appearing.  Cardiovascular:     Rate and Rhythm: Normal rate and regular rhythm.     Heart sounds: Normal heart sounds.  Pulmonary:     Breath sounds: Normal breath sounds.  Skin:    General: Skin is cool.  Neurological:     Mental Status: She is alert.     Comments: -significant left sided weakness and      Vital Signs: BP (!) 166/82 (BP Location: Left Arm)   Pulse 77   Temp 97.6 F (36.4 C) (Oral)   Resp 18   Ht 5\' 3"  (1.6 m)   Wt 58.2 kg   SpO2 96%   BMI 22.73 kg/m  Pain Scale: 0-10   Pain Score: 0-No pain   SpO2: SpO2: 96 % O2 Device:SpO2: 96 % O2 Flow Rate: .   IO: Intake/output summary:   Intake/Output Summary (Last 24 hours) at 07/25/2018 0859 Last data filed at 07/25/2018 0700 Gross per 24 hour  Intake 120 ml  Output -  Net  120 ml    LBM: Last BM Date: 07/24/18 Baseline Weight: Weight: 58.2 kg Most recent weight: Weight: 58.2 kg     Palliative Assessment/Data:  30 %   Flowsheet Rows     Most Recent Value  Intake Tab  Referral Department  Oncology  Unit at Time of Referral  Med/Surg Unit  Palliative Care Primary Diagnosis  Cancer  Date Notified  07/24/18  Palliative Care Type  Not seen  Reason Not Seen  Discharged before seen  Reason for referral  Clarify Goals of Care  Date of Admission  07/24/18  # of days IP prior to Palliative referral  0  Clinical Assessment  Psychosocial & Spiritual Assessment  Palliative Care Outcomes      Time In: 1100 Time Out: 1215 Time Total: 75 minutes Greater than 50%  of this time was spent counseling and coordinating care related to the above assessment and plan.  Signed by: Wadie Lessen, NP   Please contact Palliative Medicine Team phone at [phone] for questions and concerns.  For individual provider: See Shea Evans

## 2018-07-25 NOTE — Progress Notes (Signed)
Physical Therapy Session Note  Patient Details  Name: [name] MRN: [id number] Date of Birth: Aug 13, 1940  Today's Date: 07/25/2018 PT Individual Time: 1712-7871 PT Individual Time Calculation (min): 75 min   Short Term Goals: Week 1:  PT Short Term Goal 1 (Week 1): Pt will ambulate 75 ft with LRAD & min assist. PT Short Term Goal 2 (Week 1): Pt will negotiate 8 steps with 1 rail and min assist.  PT Short Term Goal 3 (Week 1): Pt will complete all transfers with CGA.  Skilled Therapeutic Interventions/Progress Updates:   Pt received in w/c & agreeable to tx. Pt transfers w/c<>toilet with mod assist and assistance provided to pull down/up pants, able to void bladder. Pt wheeled around unit for time management. Pt walks on uneven surface with mod assist and verbal cuing to lift LLE. Pt performs w/c<>bed transfer in apartment with min>mod assist with fatigue and bed mobility with close supervision and verbal cuing for LUE attention while rolling. Pt transfers w/c<>recliner with mod assist and verbal cuing for correct direction to turn during transfer. Pt ascends and descends 4 steps with 2 handrails with mod assist and continued verbal and tactile cuing for LUE attention. Pt ambulates 100 ft + 100 ft with RW and left hand orthosis with min assist and improved weight shift during gait pattern in order to clear LLE in swing phase. Touching assist and verbal cuing provided for use of left hand on orthosis. Pt wheeled back to room and ambulated with RW to toilet with min assist and was able to pull pants down/up with RUE with LUE on RW. Pt w/o void and ambulates back to bed with RW and min assist. Pt left lying in bed with bed alarm on and all needs in reach.   Pt c/o L lateral hip pain following toilet transfer and rest break provided.  Therapy Documentation Precautions:  Precautions Precautions: Fall Restrictions Weight Bearing Restrictions: No  Therapy/Group: Individual Therapy  [name] 07/25/2018, 4:07 PM

## 2018-07-26 ENCOUNTER — Inpatient Hospital Stay (HOSPITAL_COMMUNITY): Payer: Medicare Other

## 2018-07-26 ENCOUNTER — Inpatient Hospital Stay (HOSPITAL_COMMUNITY): Payer: Medicare Other | Admitting: Occupational Therapy

## 2018-07-26 DIAGNOSIS — M79609 Pain in unspecified limb: Secondary | ICD-10-CM

## 2018-07-26 DIAGNOSIS — Z515 Encounter for palliative care: Secondary | ICD-10-CM

## 2018-07-26 DIAGNOSIS — Z66 Do not resuscitate: Secondary | ICD-10-CM

## 2018-07-26 MED ORDER — DEXAMETHASONE 4 MG PO TABS
4.0000 mg | ORAL_TABLET | Freq: Four times a day (QID) | ORAL | Status: AC
  Administered 2018-07-26: 4 mg via ORAL

## 2018-07-26 MED ORDER — DEXAMETHASONE 4 MG PO TABS
4.0000 mg | ORAL_TABLET | Freq: Three times a day (TID) | ORAL | Status: DC
  Administered 2018-07-27 – 2018-08-02 (×19): 4 mg via ORAL
  Filled 2018-07-26 (×19): qty 1

## 2018-07-26 NOTE — Patient Care Conference (Signed)
Inpatient RehabilitationTeam Conference and Plan of Care Update Date: 07/25/2018   Time: 10:55 AM    Patient Name: [name]      Medical Record Number: [id number]  Date of Birth: [birth year] Sex: Female         Room/Bed: 4W20C/4W20C-01 Payor Info: Payor: MEDICARE / Plan: MEDICARE PART A AND B / Product Type: *No Product type* /    Admitting Diagnosis: Debility  Admit Date/Time:  07/24/2018  5:39 PM Admission Comments: No comment available   Primary Diagnosis:  <principal problem not specified> Principal Problem: <principal problem not specified>  Patient Active Problem List   Diagnosis Date Noted  . Palliative care by specialist   . DNR (do not resuscitate)   . Brain metastases ([place]) 07/24/2018  . Dyslipidemia   . Malignant neoplasm of right lung ([place])   . Leukocytosis   . Brain metastasis ([place]) 07/21/2018  . Left hemiparesis ([place]) 07/21/2018  . Lung cancer metastatic to brain ([place]) 07/21/2018  . Acute upper GI bleed   . Anemia due to acute blood loss   . Closed flail chest   . CAD (coronary artery disease) 09/21/2017  . Coronary stent thrombosis   . Endotracheal tube present   . CAD in native artery 09/20/2017  . Elevated serum creatinine 04/15/2016  . Acute kidney injury superimposed on chronic kidney disease ([place]) 03/24/2016  . Coronary artery calcification seen on CT scan   . CKD (chronic kidney disease), stage III ([place])   . Hypertension   . Hypothyroidism 03/15/2016  . Hyperlipidemia 08/19/2015  . Cancer of upper lobe of left lung ([place])   . Recurrent lung adenocarcinoma ([place]) 10/01/2014  . Local recurrence of left lung cancer ([place])   . Carotid artery disease without cerebral infarction ([place]) 05/20/2013  . Atherosclerosis of aorta ([place]) 05/10/2013  . Macular degeneration 05/10/2013  . Allergic rhinitis 10/20/2012  . Cancer of upper lobe of lung ([place]) 09/28/2011  . Melanoma ([place])   . LBBB (left bundle branch block)   . Gastritis due to nonsteroidal  anti-inflammatory drug 06/20/2011  . Hyperthyroidism   . Tremor   . Abdominal pain, RUQ 02/10/2011  . GERD with stricture 02/08/2011  . GOITER, MULTINODULAR 09/25/2009  . URI 09/02/2009  . Essential hypertension, benign 12/01/2008    Expected Discharge Date: Expected Discharge Date: 08/02/18  Team Members Present: Physician leading conference: Dr. Alysia Penna Social Worker Present: Alfonse Alpers, LCSW Nurse Present: Rozetta Nunnery, RN PT Present: Lavone Nian, PT OT Present: Simonne Come, OT SLP Present: Charolett Bumpers, SLP PPS Coordinator present : Gunnar Fusi     Current Status/Progress Goal Weekly Team Focus  Medical   Mild left hemiparesis due to metastatic lesions from non-small cell lung CA  Maintain medical stability, control blood pressure  Institute rehabilitation program   Bowel/Bladder   continent of bowel & bladder, LBM 07/24/18  remain continenet  assist as needed   Swallow/Nutrition/ Hydration             ADL's   Min assist bathing/dressing at sit > stand level, mod assist donning socks  Supervision overall  ADL retraining, sit > stand, dynamic standing balance, LUE NMR, IADLs   Mobility   min assist transfers, mod assist gait x 85 ft without AD  supervision overall except CGA for stair negotiation with 1 rail for home access  balance, NMR, transfers, gait, stair negotiation, pt education, d/c planning   Communication  Safety/Cognition/ Behavioral Observations            Pain   no c/o pain, has tylenol & gabapentin scheduled  pain scale 2/10  assess & treat as needed   Skin   bruising & edema to the left eye, area of mole removal from nose & right leg  no new areas of skin break down  assess q shift    Rehab Goals Patient on target to meet rehab goals: Yes Rehab Goals Revised: none - pt's first team conference on day of evaluations *See Care Plan and progress notes for long and short-term goals.     Barriers to Discharge  Current  Status/Progress Possible Resolutions Date Resolved   Physician    Medical stability;Pending chemo/radiation     Initiating therapies  See above will need to coordinate with treatments for her metastatic brain lesions      Nursing  Pending chemo/radiation               PT  Pending chemo/radiation                 OT                  SLP                SW                Discharge Planning/Teaching Needs:  Pt to return to her home with family and [facility] caregivers to assist pt.  Pt is independent to direct her care.   Team Discussion:  Pt with non small cell lung CA with mets to the brain.  She has mild left hemiparesis, cognition is good.  She is min A for UB bathing and dressing and mod A for LB.  Pt has a little inattention of her left hand.  Her fingertip sensation is off.  Pt is min A stand pivot txs and mod A gait.  She has good awareness and family support.  She is continent and has simple dressings that will need to be changed.  Revisions to Treatment Plan:  none    Continued Need for Acute Rehabilitation Level of Care: The patient requires daily medical management by a physician with specialized training in physical medicine and rehabilitation for the following conditions: Daily direction of a multidisciplinary physical rehabilitation program to ensure safe treatment while eliciting the highest outcome that is of practical value to the patient.: Yes Daily medical management of patient stability for increased activity during participation in an intensive rehabilitation regime.: Yes Daily analysis of laboratory values and/or radiology reports with any subsequent need for medication adjustment of medical intervention for : Neurological problems;Post surgical problems   I attest that I was present, lead the team conference, and concur with the assessment and plan of the team.   Dorthula Bier, Silvestre Mesi 07/26/2018, 8:59 PM

## 2018-07-26 NOTE — Progress Notes (Signed)
[name], RN  Rehab Admission Coordinator  Physical Medicine and Rehabilitation  PMR Pre-admission  Signed  Date of Service:  07/24/2018 3:31 PM       Related encounter: ED to Hosp-Admission (Discharged) from 07/21/2018 in [facility]      Signed         Show:Clear all [x] Manual[x] Template[x] Copied  Added by: [x] Merleen Picazo, Evalee Mutton, RN  [] Hover for details PMR Admission Coordinator Pre-Admission Assessment  Patient: [name] is an 78 y.o., female MRN: [id number] DOB: March 11, 1941 Height: 5\' 3"  (160 cm) Weight: 58.1 kg                                                                                                                                                  Insurance Information HMO: No    PPO:       PCP:       IPA:       80/20:       OTHER:   PRIMARY:  Medicare a and b      Policy#: [id number]      Subscriber: patient CM Name:        Phone#:       Fax#:   Pre-Cert#:        Employer: Retired Benefits:  Phone #:       Name: Checked in [place] [employer] Date: 10/09/05     Deduct:  $1408      Out of Pocket Max: none      Life Max: N/A CIR: 100%      SNF: 100 days Outpatient: 80%     Co-Pay: 20% Home Health: 100%      Co-Pay: none DME: 80%     Co-Pay: 20% Providers: patient's choice  SECONDARY:  [employer]      Policy#: [id number]      Subscriber: patient CM Name:        Phone#:       Fax#:   Pre-Cert#:        Employer: Retired Benefits:  Phone #: [phone]     Name:   Eff. Date: 01/08/06     Deduct:        Out of Pocket Max:        Life Max:   CIR:        SNF:   Outpatient:       Co-Pay:   Home Health:        Co-Pay:   DME:       Co-Pay:    Emergency Contact Information         Contact Information    Name Relation Home Work Mobile   [place] Son   [phone]   Cherylann Parr Daughter   [phone]     Current Medical History  Patient Admitting Diagnosis:  Brain metastasis  History of Present Illness: A  78 year old right-handed female with history of chronic renal insufficiency stage III, hypertension, hyperlipidemia, non-small cell lung cancer with right upper lobe lobectomy 2009 recurrence in 2013 that was treated with radiation therapy and followed by oncology services. Per chart review and patient, patient lives alone independent prior to admission for most tasks with assistance of children and grandchildren for other tasks. She does have family in good support. Presented 07/21/2018 after recent fall with left arm weakness. MRI the brain reviewed, showing intra-and extracranial brain masses. Per report, showed at least 8 parenchymal metastasis, to left periorbital subcutaneous brain lesions with marked surrounding edema likely reflecting subcutaneous metastasis. Oncology services follow-up Dr. Jana Hakim with plan for [facility] radiation therapy which was also discussed with Dr. Julien Nordmann and Dr. Lisbeth Renshaw of radiation oncology. Therapy evaluations completed with recommendations of physical medicine rehabilitation consult. Patient was admitted for a comprehensive rehabilitation program.   Complete NIHSS TOTAL: 0  Past Medical History      Past Medical History:  Diagnosis Date  . Anemia 09/2017  . Arthritis    back.and feet  . Cervical ca ([place]) dx'd 1988   surg only  . CKD (chronic kidney disease), stage III ([place])   . Complication of anesthesia    difficulty awakening, dysrhythmia post surgery, (prolonged sedation level)  . Coronary artery calcification seen on CT scan   . Dyspnea   . GERD (gastroesophageal reflux disease)   . Headache    occ. sinus type headaches  . History of hiatal hernia   . Hx of radiation therapy 09/16/13-09/30/13   prevascular lymph node  . Hyperlipidemia   . Hypertension   . Hyperthyroidism    dr Loanne Drilling- radioactive iodine treatment 2011- resolved  . Hypothyroidism   . LBBB (left bundle branch block)   . Lung cancer ([place])    Bilaterally, RUL  lobectomy and spot involving both lungs- "cancer"  . Macular degeneration    bilateral  . Melanoma ([place])    left leg  . Radiation 10/11/11-10/21/11   Left upper lobe adenocarcinoma  . Tremor    hand- intention tremor    Family History  family history includes Asthma in her mother; Cancer in her maternal grandfather and maternal uncle; Heart disease in her mother; Thyroid disease in her daughter.  Prior Rehab/Hospitalizations: Was at [facility] in 2019 after hospitalization with cardiac stent and cardiac arrest per her son.  Has the patient had major surgery during 100 days prior to admission? No  Current Medications   Current Facility-Administered Medications:  .  acetaminophen (TYLENOL) tablet 1,000 mg, 1,000 mg, Oral, Once, Ghimire, Dante Gang, MD .  acetaminophen (TYLENOL) tablet 650 mg, 650 mg, Oral, Q6H, Ghimire, Kuber, MD, 650 mg at 07/24/18 1258 .  amLODipine (NORVASC) tablet 10 mg, 10 mg, Oral, Daily, Oretha Milch D, MD, 10 mg at 07/24/18 1043 .  amLODipine (NORVASC) tablet 5 mg, 5 mg, Oral, Once, Nettey, Myrlene Broker D, MD .  atorvastatin (LIPITOR) tablet 80 mg, 80 mg, Oral, Q2200, Barb Merino, MD, 80 mg at 07/23/18 2119 .  calcium-vitamin D (OSCAL WITH D) 500-200 MG-UNIT per tablet 2 tablet, 2 tablet, Oral, Q breakfast, Barb Merino, MD, 2 tablet at 07/24/18 1043 .  carvedilol (COREG) tablet 25 mg, 25 mg, Oral, BID, Barb Merino, MD, 25 mg at 07/24/18 1043 .  dexamethasone (DECADRON) tablet 4 mg, 4 mg, Oral, Q6H, Ghimire, Kuber, MD, 4 mg at 07/24/18 1258 .  etodolac (LODINE) tablet 400 mg, 400 mg, Oral, BID, Ghimire,  Dante Gang, MD, 400 mg at 07/24/18 1043 .  gabapentin (NEURONTIN) capsule 600 mg, 600 mg, Oral, QHS, Ghimire, Kuber, MD, 600 mg at 07/23/18 2119 .  ketotifen (ZADITOR) 0.025 % ophthalmic solution 1 drop, 1 drop, Right Eye, BID PRN, Barb Merino, MD .  levothyroxine (SYNTHROID, LEVOTHROID) tablet 88 mcg, 88 mcg, Oral, QAC breakfast, Barb Merino, MD,  88 mcg at 07/24/18 0525 .  losartan (COZAAR) tablet 50 mg, 50 mg, Oral, BID, Barb Merino, MD, 50 mg at 07/24/18 1043 .  morphine (MSIR) tablet 7.5 mg, 7.5 mg, Oral, Q4H PRN, Barb Merino, MD, 7.5 mg at 07/21/18 0514 .  multivitamin with minerals tablet 1 tablet, 1 tablet, Oral, Daily, Barb Merino, MD, 1 tablet at 07/24/18 1043 .  nitroGLYCERIN (NITROSTAT) SL tablet 0.4 mg, 0.4 mg, Sublingual, Q5 Min x 3 PRN, Ghimire, Kuber, MD .  ondansetron (ZOFRAN-ODT) disintegrating tablet 4 mg, 4 mg, Oral, Q8H PRN, Barb Merino, MD .  pantoprazole (PROTONIX) EC tablet 40 mg, 40 mg, Oral, BID, Barb Merino, MD, 40 mg at 07/24/18 1043 .  promethazine (PHENERGAN) tablet 25 mg, 25 mg, Oral, Q6H PRN, Barb Merino, MD  Patients Current Diet:     Diet Order                  Diet - low sodium heart healthy         Diet regular Room service appropriate? Yes; Fluid consistency: Thin  Diet effective now               Precautions / Restrictions Precautions Precautions: Fall Precaution Comments: L weakness/neglect and fall at home due to same Restrictions Weight Bearing Restrictions: No   Has the patient had 2 or more falls or a fall with injury in the past year?Yes.  Had one fall with injury resulting in current admission.  Prior Activity Level Limited Community (1-2x/wk): Went out 3 X a week, was driving  Development worker, international aid / Publishing rights manager: Environmental consultant - 2 wheels, Environmental consultant - 4 wheels, [place] - single point, Bedside commode, Shower seat, Wheelchair - manual  Prior Device Use: Indicate devices/aids used by the patient prior to current illness, exacerbation or injury? Walker and [employer]  Prior Functional Level Prior Function Level of Independence: Independent  Self Care: Did the patient need help bathing, dressing, using the toilet or eating?  Independent  Indoor Mobility: Did the patient need assistance with walking from room to room (with or without device)?  Independent  Stairs: Did the patient need assistance with internal or external stairs (with or without device)? Independent  Functional Cognition: Did the patient need help planning regular tasks such as shopping or remembering to take medications? Independent  Current Functional Level Cognition  Overall Cognitive Status: Within Functional Limits for tasks assessed Orientation Level: Oriented X4 General Comments: pleasent and engaged    Extremity Assessment (includes Sensation/Coordination)  Upper Extremity Assessment: Defer to OT evaluation(LUE ataxia/lack of motor control, no grip) LUE Deficits / Details: grossly 3+/5. LUE neglect noted. Unable to maintain functional grasp. LUE Sensation: decreased proprioception LUE Coordination: decreased fine motor, decreased gross motor  Lower Extremity Assessment: LLE deficits/detail LLE Deficits / Details: 3/5 max at knee/ankle, able to hold knee extension to flex ankle 2-3 reps max; knee occasionally to frequently buckles during gait cycle LLE Coordination: decreased gross motor    ADLs  Overall ADL's : Needs assistance/impaired Eating/Feeding: Minimal assistance, Sitting, Set up Grooming: Moderate assistance, Sitting Upper Body Bathing: Moderate assistance, Sitting Lower Body Bathing: Maximal  assistance, +2 for safety/equipment Upper Body Dressing : Moderate assistance, Sitting Lower Body Dressing: Maximal assistance, +2 for safety/equipment, Sit to/from stand Toilet Transfer: Maximal assistance, Stand-pivot, BSC, RW Toilet Transfer Details (indicate cue type and reason): Simulated with EOB>recliner. Therapist providing steadying assist on [place], pt controlling rw with R hand only. Cues for timing of sitting into chair. General ADL Comments: Pt completed bed mobility, sat EOB several minutes, stood 2x from EOB, side stepped left then right, then completed pivotal steps to recliner. Discussed safety and strategies for LUE neglect.      Mobility  Overal bed mobility: Needs Assistance Bed Mobility: Rolling, Sidelying to Sit Rolling: Supervision Sidelying to sit: Supervision Supine to sit: Min assist, HOB elevated General bed mobility comments: cues for technique    Transfers  Overall transfer level: Needs assistance Equipment used: Rolling walker (2 wheeled) Transfers: Sit to/from Stand Sit to Stand: Min assist, +2 safety/equipment Stand pivot transfers: Max assist General transfer comment: assist for safety, cues for hand placement    Ambulation / Gait / Stairs / Wheelchair Mobility  Ambulation/Gait Ambulation/Gait assistance: Mod assist, +2 safety/equipment(chair follow) Gait Distance (Feet): 40 Feet Assistive device: Rolling walker (2 wheeled), 1 person hand held assist Gait Pattern/deviations: Decreased step length - left, Decreased weight shift to right, Wide base of support General Gait Details: assist with walker to keep L hand on walker, then provided R HHA without walker and continued to walk, though pt more fearful of falling.  Assist for R lateral weight shift to improve L foot clearance    Posture / Balance Balance Overall balance assessment: Needs assistance, History of Falls Sitting-balance support: Feet supported, No upper extremity supported Sitting balance-Leahy Scale: Fair Standing balance support: Single extremity supported, During functional activity Standing balance-Leahy Scale: Poor Standing balance comment: overshifts to L and assist to weight shift R; worked on during gait; too fatigued to stand again and work on Civil Service fast streamer needs/care consideration BiPAP/CPAP No CPM No Continuous Drip IV No Dialysis No       Life Vest No Oxygen No Special Bed No Trach Size No Wound Vac (area) No   Skin Bruising on face, eye left side                             Bowel mgmt: Last BM 07/23/18 Bladder mgmt: External catheter Diabetic mgmt No    Previous Home Environment Living  Arrangements: Alone Available Help at Discharge: Family Type of Home: House Home Layout: Two level, Able to live on main level with bedroom/bathroom Home Access: Stairs to enter Entrance Stairs-Rails: Right Entrance Stairs-Number of Steps: 1 Bathroom Shower/Tub: Multimedia programmer: Handicapped height Bathroom Accessibility: Yes Additional Comments: Pt lives alone but has family who can stay with her at d/c if needed  Discharge Living Setting Plans for Discharge Living Setting: Alone, House Type of Home at Discharge: House Discharge Home Layout: Two level, Able to live on main level with bedroom/bathroom Alternate Level Stairs-Number of Steps: Flight Discharge Home Access: Stairs to enter Technical brewer of Steps: 2 step entry Discharge Bathroom Shower/Tub: Walk-in shower, Door Discharge Bathroom Toilet: Standard Discharge Bathroom Accessibility: No(Have put in many hand rails to assist.)  [place] Patient Roles: Parent, Other (Comment)(Has a son, daughter, brother.  Widow since 02/07/17.) Contact Information: Ermelinda Das - son  Anticipated Caregiver: son, daughter, brother, other family Anticipated Caregiver's Contact Information: See emergency contacts Ability/Limitations of Caregiver:  Family willing to provide care.  Has a long term care policy and can hire help as needed. Caregiver Availability: Other (Comment)(Family aware of need to plan for 24/7 care) Discharge Plan Discussed with Primary Caregiver: Yes Is Caregiver In Agreement with Plan?: Yes Does Caregiver/Family have Issues with Lodging/Transportation while Pt is in Rehab?: No  Goals/Additional Needs Patient/Family Goal for Rehab: PT/OT min assist goals Expected length of stay: 18-23 days Cultural Considerations: None Dietary Needs: Regular diet, thin liquids Equipment Needs: TBD Special Service Needs: Patient will need steriotactic radiation next Wednesday at [facility]; time is  pending. Pt/Family Agrees to Admission and willing to participate: Yes Program Orientation Provided & Reviewed with Pt/Caregiver Including Roles  & Responsibilities: Yes  Decrease burden of Care through IP rehab admission: N/A  Possible need for SNF placement upon discharge: Not anticipated  Patient Condition: This patient's condition remains as documented in the consult dated 07/23/18, in which the Rehabilitation Physician determined and documented that the patient's condition is appropriate for intensive rehabilitative care in an inpatient rehabilitation facility. Will admit to inpatient rehab today.  Preadmission Screen Completed By:  [name], 07/24/2018 3:41 PM ______________________________________________________________________   Discussed status with Dr. Naaman Plummer on 07/24/18 at 1539 and received telephone approval for admission today.  Admission Coordinator:  [name], time 1541/Date 07/24/18           Cosigned by: Meredith Staggers, MD at 07/24/2018 4:01 PM  Revision History

## 2018-07-26 NOTE — Progress Notes (Signed)
Physical Therapy Session Note  Patient Details  Name: [name] MRN: [id number] Date of Birth: Nov 10, 1940  Today's Date: 07/26/2018 PT Individual Time: 0900-1000 PT Individual Time Calculation (min): 60 min   Short Term Goals: Week 1:  PT Short Term Goal 1 (Week 1): Pt will ambulate 75 ft with LRAD & min assist. PT Short Term Goal 2 (Week 1): Pt will negotiate 8 steps with 1 rail and min assist.  PT Short Term Goal 3 (Week 1): Pt will complete all transfers with CGA.  Skilled Therapeutic Interventions/Progress Updates:    Patient in supine in bed, supervision for rolling and coming up to sit, pivot to w/c minguard A, propelled w/c with feet and R UE with cues and min A.  Sit to stand minguard A and ambulated with RW with L UE hand splint x 100'.  Standing weight shift/balance activity reaching with L UE for bean bags min A for balance and cues for L hip activation in stance.  Standing step taps to cones with min A; modified due to some pain L thigh area.  Seated rest breaks between activities.  Standing in stride forward/backward and lateral weight shifts and stepping with R for L stance stabilization.  Ambulated with QC x 45' min A increased time mod cues for placement of cane, step length and assist for weight shift.  Noted increased cervical flexion for visualization of steps.  Ambulated to room from gym with RW and min A cues for L hip stabilization in stance.  Left in w/c with waist alarm and needs in reach.   Therapy Documentation Precautions:  Precautions Precautions: Fall Restrictions Weight Bearing Restrictions: No Pain: Pain Assessment Pain Scale: 0-10 Pain Score: 2  Pain Type: Acute pain Pain Location: Leg Pain Orientation: Anterior;Upper Pain Descriptors / Indicators: Sore Pain Onset: On-going Pain Intervention(s): Repositioned;Ambulation/increased activity      Therapy/Group: Individual Therapy  Reginia Naas  [place],  [place] 07/26/2018  07/26/2018, 12:27 PM

## 2018-07-26 NOTE — Progress Notes (Signed)
[facility] PHYSICAL MEDICINE & REHABILITATION PROGRESS NOTE   Subjective/Complaints: Pt now able to pull up sheets with Left hand  ROS- No CP, SOB, N/V/D  Objective:   No results found. Recent Labs    07/25/18 0520  WBC 10.6*  HGB 12.2  HCT 36.7  PLT 201   Recent Labs    07/24/18 0505 07/25/18 0520  NA 140 137  K 3.8 3.8  CL 107 106  CO2 24 24  GLUCOSE 129* 132*  BUN 27* 32*  CREATININE 0.89 1.00  CALCIUM 9.3 9.0    Intake/Output Summary (Last 24 hours) at 07/26/2018 8299 Last data filed at 07/25/2018 1800 Gross per 24 hour  Intake 240 ml  Output -  Net 240 ml     Physical Exam: Vital Signs Blood pressure 135/89, pulse 88, temperature 98.6 F (37 C), resp. rate 19, height 5\' 3"  (1.6 m), weight 58.2 kg, SpO2 99 %.   General: No acute distress Mood and affect are appropriate Heart: Regular rate and rhythm no rubs murmurs or extra sounds Lungs: Clear to auscultation, breathing unlabored, no rales or wheezes Abdomen: Positive bowel sounds, soft nontender to palpation, nondistended Extremities: No clubbing, cyanosis, or edema Skin: No evidence of breakdown, no evidence of rash Neurologic: Cranial nerves II through XII intact, motor strength is 5/5 in RIght  deltoid, bicep, tricep, grip, hip flexor, knee extensors, ankle dorsiflexor and plantar flexor, 4/5 on left side   Musculoskeletal: Full range of motion in all 4 extremities. No joint swelling   Assessment/Plan: 1. 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  Physiatrist is providing close team supervision and 24 hour management of active medical problems listed below.  Physiatrist and rehab team continue to assess barriers to discharge/monitor patient progress toward functional and medical goals  Care Tool:  Bathing    Body parts bathed by patient:  Left arm, Chest, Abdomen, Front perineal area, Buttocks, Right upper leg, Left upper leg, Face   Body parts bathed by helper: Right lower leg, Left lower leg, Right arm     Bathing assist Assist Level: Moderate Assistance - Patient 50 - 74%     Upper Body Dressing/Undressing Upper body dressing   What is the patient wearing?: Pull over shirt    Upper body assist Assist Level: Contact Guard/Touching assist    Lower Body Dressing/Undressing Lower body dressing      What is the patient wearing?: Underwear/pull up, Pants     Lower body assist Assist for lower body dressing: Moderate Assistance - Patient 50 - 74%     Toileting Toileting    Toileting assist Assist for toileting: Moderate Assistance - Patient 50 - 74%     Transfers Chair/bed transfer  Transfers assist     Chair/bed transfer assist level: Moderate Assistance - Patient 50 - 74%(min assist increasing to mod assist with fatigue)     Locomotion Ambulation   Ambulation assist      Assist level: Minimal Assistance - Patient > 75% Assistive device: Walker-rolling(left hand orthosis used on RW) Max distance: 100 ft    Walk 10 feet activity   Assist     Assist level: Minimal Assistance - Patient > 75% Assistive device: Walker-rolling(L hand orthosis)   Walk 50 feet activity   Assist    Assist level: Minimal Assistance - Patient > 75% Assistive device: Walker-rolling(left hand orthosis  used on RW)    Walk 150 feet activity   Assist Walk 150 feet activity did not occur: Safety/medical concerns         Walk 10 feet on uneven surface  activity   Assist Walk 10 feet on uneven surfaces activity did not occur: Safety/medical concerns   Assist level: Moderate Assistance - Patient - 55 - 74%     Programmer, multimedia activity did not occur: Safety/medical concerns         Wheelchair 50 feet with 2 turns activity    Assist    Wheelchair 50 feet with 2  turns activity did not occur: Safety/medical concerns       Wheelchair 150 feet activity     Assist Wheelchair 150 feet activity did not occur: Safety/medical concerns          Medical Problem List and Plan: 1.Decreased functional mobility with left side weaknesssecondary to brain metastasisdue tonon-small cell lung cancer with right upper lobe lobectomy. Plan SRS radiation therapy per follow-up radiation oncology. Palliative care consult has been obtained -[facility] PT, OT, ELOS 1/23 2. DVT Prophylaxis/Anticoagulation: SCD 3. Pain Management:Neurontin 600 mg daily at bedtime,Lodine 400 mg twice a day MSIR 7.5 mg every 4 hours as needed -pain left thigh no muscular tenderness check femur xray prior knee flims and CT neg for lesion, did have Chondrocalcinosis 4. Mood:Provide emotional support 5. Neuropsych: This patientiscapable of making decisions on herown behalf. 6. Skin/Wound Care:Routine skin checks 7. Fluids/Electrolytes/Nutrition:Routine in and out's with follow-up chemistries.  -encourage PO 8. Hypertension. Norvasc 10 mg daily, Coreg 25 mg twice a day. Monitor with increased mobility Vitals:   07/25/18 2006 07/26/18 0549  BP: (!) 142/70 135/89  Pulse: 77 88  Resp: 17 19  Temp: 97.8 F (36.6 C) 98.6 F (37 C)  SpO2: 99%   BPs stabilizing cont to monitor 9. Hypothyroidism. Synthroid 10. Hyperlipidemia. Lipitor    LOS: 2 days A FACE TO FACE EVALUATION WAS PERFORMED  Charlett Blake 07/26/2018, 8:08 AM

## 2018-07-26 NOTE — Progress Notes (Signed)
Social Work Patient ID: [name], female   DOB: 10/18/1940, 78 y.o.   MRN: [id number]   CSW met with pt and her son and dtr 07-25-18 to update them on team conference discussion and targeted d/c date of 08-02-18.  They were pleased to learn this and pt plans to work hard.  She is motivated and likes to be in control.  Pt will go ahead and alert [facility] that she will need caregivers for a little while after d/c.  CSW will assist with pt getting to [facility] 08-01-18 for radiation treatment.  Pt has all recommended DME, but will need HH ordered.  She cannot remember [facility] agency she has used in the past, but liked the therapists.  CSW is trying to find the agency.  CSW will continue to follow and assist as needed.

## 2018-07-26 NOTE — Progress Notes (Signed)
Left lower extremity venous duplex has been completed.   Preliminary results in CV Proc.   Abram Sander 07/26/2018 2:15 PM

## 2018-07-26 NOTE — Progress Notes (Signed)
Jessica Arn, MD  Physician  Physical Medicine and Rehabilitation  Consult Note  Signed  Date of Service:  07/23/2018 5:30 AM       Related encounter: ED to Hosp-Admission (Discharged) from 07/21/2018 in [facility]      Signed      Expand All Collapse All    Show:Clear all [x] Manual[x] Template[] Copied  Added by: [x] [name], Jessica Paganini, PA-C[x] Jessica Arn, MD  [] Hover for details      Physical Medicine and Rehabilitation Consult Reason for Consult:  Decreased functional mobility Referring Physician: [facility]   HPI: [name] is a 78 y.o.right handed female with history of CKD stage III, hypertension, hyperlipidemia, non-small cell lung cancer with right upper lobe lobectomy 2009 recurrence in 2013 that was treated with radiation and followed by oncology services. Per chart review and patient, patient lives alone independent prior to admission for most tasks with the assistance and children and grandchildren for other tasks. She does have family with good support but they work during the day. Presented 07/21/2018 after a recent fall with left arm weakness. MRI the brain reviewed, showing intra-and extracranial brain masses.  Per report, showed at least 8 parenchymal metastasis, 2 left periorbital subcutaneous lesion with marked surrounding edema likely reflecting subcutaneous metastasis. Oncology service follow-up Dr.Magrinat and await plan for [facility] per neurosurgery Dr. Sherley Bounds as well as discussion with Dr. Julien Nordmann and Dr. Lisbeth Renshaw of radiation oncology for further plan of care.. therapy evaluations completed with recommendations of physical medicine and rehabilitation consult   Review of Systems  Constitutional: Negative for chills and fever.  HENT: Negative for hearing loss.   Eyes: Negative for blurred vision and double vision.  Respiratory: Positive for shortness of breath.   Cardiovascular: Negative for chest pain and palpitations.    Gastrointestinal: Positive for constipation. Negative for nausea.       GERD  Genitourinary: Negative for dysuria, flank pain and hematuria.  Musculoskeletal: Positive for back pain and falls.  Skin: Negative for rash.  Neurological: Positive for focal weakness. Negative for sensory change.  All other systems reviewed and are negative.      Past Medical History:  Diagnosis Date  . Anemia 09/2017  . Arthritis    back.and feet  . Cervical ca ([place]) dx'd 1988   surg only  . CKD (chronic kidney disease), stage III ([place])   . Complication of anesthesia    difficulty awakening, dysrhythmia post surgery, (prolonged sedation level)  . Coronary artery calcification seen on CT scan   . Dyspnea   . GERD (gastroesophageal reflux disease)   . Headache    occ. sinus type headaches  . History of hiatal hernia   . Hx of radiation therapy 09/16/13-09/30/13   prevascular lymph node  . Hyperlipidemia   . Hypertension   . Hyperthyroidism    dr Loanne Drilling- radioactive iodine treatment 2011- resolved  . Hypothyroidism   . LBBB (left bundle branch block)   . Lung cancer ([place])    Bilaterally, RUL lobectomy and spot involving both lungs- "cancer"  . Macular degeneration    bilateral  . Melanoma ([place])    left leg  . Radiation 10/11/11-10/21/11   Left upper lobe adenocarcinoma  . Tremor    hand- intention tremor        Past Surgical History:  Procedure Laterality Date  . basal cell and squamous cell skin cell area removed     new left lower eye lid done with skin graft   .  CARDIAC CATHETERIZATION  09/21/2017  . CATARACT EXTRACTION, BILATERAL Bilateral   . COLONOSCOPY WITH PROPOFOL N/A 10/27/2016   Procedure: COLONOSCOPY WITH PROPOFOL;  Surgeon: Laurence Spates, MD;  Location: [facility];  Service: Endoscopy;  Laterality: N/A;  . conization for cervical dysplasia    . CORONARY STENT INTERVENTION N/A 09/21/2017   Procedure: CORONARY STENT INTERVENTION;  Surgeon:  Belva Crome, MD;  Location: [place] [facility];  Service: Cardiovascular;  Laterality: N/A;  . CORONARY/GRAFT ACUTE MI REVASCULARIZATION N/A 09/21/2017   Procedure: Coronary/Graft Acute MI Revascularization;  Surgeon: Belva Crome, MD;  Location: [place] [facility];  Service: Cardiovascular;  Laterality: N/A;  . ESOPHAGOGASTRODUODENOSCOPY (EGD) WITH PROPOFOL N/A 09/28/2017   Procedure: ESOPHAGOGASTRODUODENOSCOPY (EGD) WITH PROPOFOL;  Surgeon: Arta Silence, MD;  Location: [place];  Service: Endoscopy;  Laterality: N/A;  . ESOPHAGOGASTRODUODENOSCOPY (EGD) WITH PROPOFOL N/A 09/30/2017   Procedure: ESOPHAGOGASTRODUODENOSCOPY (EGD) WITH PROPOFOL;  Surgeon: Otis Brace, MD;  Location: [facility];  Service: Gastroenterology;  Laterality: N/A;  . esophogus stretching    . GIVENS CAPSULE STUDY N/A 10/06/2017   Procedure: GIVENS CAPSULE STUDY;  Surgeon: Arta Silence, MD;  Location: [facility] ENDOSCOPY;  Service: Endoscopy;  Laterality: N/A;  . INTRAVASCULAR ULTRASOUND/IVUS N/A 09/21/2017   Procedure: Intravascular Ultrasound/IVUS;  Surgeon: Belva Crome, MD;  Location: [place] [facility];  Service: Cardiovascular;  Laterality: N/A;  . IR GENERIC HISTORICAL  09/11/2014   IR RADIOLOGIST EVAL & MGMT 09/11/2014 Jacqulynn Cadet, MD [facility] INTERV RAD  . LEFT HEART CATH AND CORONARY ANGIOGRAPHY N/A 09/21/2017   Procedure: LEFT HEART CATH AND CORONARY ANGIOGRAPHY;  Surgeon: Belva Crome, MD;  Location: [place] [facility];  Service: Cardiovascular;  Laterality: N/A;  . LEFT HEART CATH AND CORONARY ANGIOGRAPHY N/A 09/21/2017   Procedure: LEFT HEART CATH AND CORONARY ANGIOGRAPHY;  Surgeon: Belva Crome, MD;  Location: [place] [facility];  Service: Cardiovascular;  Laterality: N/A;  . radio frequency ablation on lung spot    . rul lobectomy  2010  . TONSILLECTOMY    . TONSILLECTOMY          Family History  Problem Relation Age of Onset  . Asthma Mother   . Heart disease Mother    . Thyroid disease Daughter        hypothyroidism  . Cancer Maternal Uncle        lung  . Cancer Maternal Grandfather        lung  . Diabetes Neg Hx   . Coronary artery disease Neg Hx    Social History:  reports that she quit smoking about 42 years ago. Her smoking use included cigarettes. She has a 17.00 pack-year smoking history. She has never used smokeless tobacco. She reports current alcohol use. She reports that she does not use drugs. Allergies:       Allergies  Allergen Reactions  . Meperidine Hcl Other (See Comments)    "knocked pt out for 4 days"  . Morphine Other (See Comments)    Stopped breathing due to too high of a dose per patient.  Not an allergic reaction to morphine  . Penicillins Anaphylaxis, Shortness Of Breath and Other (See Comments)    Difficulty breathing  . Oxycodone-Acetaminophen Itching       . Propoxyphene N-Acetaminophen Nausea And Vomiting  . Tramadol Hcl Nausea And Vomiting         Medications Prior to Admission  Medication Sig Dispense Refill  . acetaminophen (TYLENOL) 650 MG CR tablet Take  1,300 mg by mouth 2 (two) times daily.     Marland Kitchen amLODipine (NORVASC) 10 MG tablet Take 0.5 tablets (5 mg total) by mouth daily. 30 tablet 11  . atorvastatin (LIPITOR) 80 MG tablet Take 1 tablet (80 mg total) by mouth daily. 90 tablet 3  . Calcium Carbonate-Vitamin D3 (CALCIUM 600-D) 600-400 MG-UNIT TABS Take 1 tablet by mouth every morning.     . carvedilol (COREG) 25 MG tablet Take 1 tablet (25 mg total) by mouth 2 (two) times daily. 60 tablet 11  . DM-APAP-CPM (CORICIDIN HBP FLU PO) Take 2 tablets by mouth every 6 (six) hours as needed (flu symptoms).    Marland Kitchen etodolac (LODINE) 400 MG tablet Take 400 mg by mouth 2 (two) times daily.  3  . fluticasone (FLONASE) 50 MCG/ACT nasal spray Place 1 spray into both nostrils daily as needed for allergies.     Marland Kitchen gabapentin (NEURONTIN) 300 MG capsule Take 600 mg by mouth at bedtime.   2  .  ketotifen (ZADITOR) 0.025 % ophthalmic solution Place 1 drop into the right eye 2 (two) times daily as needed (irritation).    Marland Kitchen levothyroxine (SYNTHROID, LEVOTHROID) 88 MCG tablet Take 88 mcg by mouth daily before breakfast.     . losartan (COZAAR) 50 MG tablet Take 1 tablet (50 mg total) by mouth 2 (two) times daily. 180 tablet 3  . Multiple Vitamin (MULTIVITAMIN WITH MINERALS) TABS tablet Take 1 tablet by mouth daily.    . Multiple Vitamins-Minerals (PRESERVISION AREDS 2 PO) Take 1 capsule by mouth at bedtime.     . nitroGLYCERIN (NITROSTAT) 0.4 MG SL tablet Place 1 tablet (0.4 mg total) under the tongue every 5 (five) minutes as needed for chest pain.  0  . ondansetron (ZOFRAN ODT) 4 MG disintegrating tablet Take 1 tablet (4 mg total) by mouth every 8 (eight) hours as needed for nausea or vomiting. 10 tablet 0  . pantoprazole (PROTONIX) 40 MG tablet Take 1 tablet (40 mg total) by mouth 2 (two) times daily.    Vladimir Faster Glycol-Propyl Glycol (SYSTANE) 0.4-0.3 % SOLN Apply 1 drop to eye daily as needed.    . promethazine (PHENERGAN) 25 MG tablet Take 25 mg by mouth every 6 (six) hours as needed for nausea or vomiting.      Home: Home Living Family/patient expects to be discharged to:: Private residence Living Arrangements: Alone Available Help at Discharge: Family Type of Home: House Home Access: Stairs to enter Technical brewer of Steps: 1 Entrance Stairs-Rails: Right Home Layout: Two level, Able to live on main level with bedroom/bathroom Bathroom Shower/Tub: Multimedia programmer: Handicapped height Bathroom Accessibility: Yes Home Equipment: Environmental consultant - 2 wheels, Environmental consultant - 4 wheels, Cane - single point, Bedside commode, Shower seat, Wheelchair - manual Additional Comments: Pt lives alone but has family who can stay with her at d/c if needed  Functional History: Prior Function Level of Independence: Independent Functional Status:  Mobility: Bed  Mobility Overal bed mobility: Needs Assistance Bed Mobility: Supine to Sit Supine to sit: Min assist, HOB elevated General bed mobility comments: pt up in/back to chair Transfers Overall transfer level: Needs assistance Equipment used: Rolling walker (2 wheeled) Transfers: Sit to/from Stand Sit to Stand: Min assist Stand pivot transfers: Max assist General transfer comment: able to scoot to EOB and use R hemibody to lift to standing, needs assist to bring trunk to neutral and preoccupied by clumsy hand Ambulation/Gait Ambulation/Gait assistance: Max assist Gait Distance (Feet): 5 Feet Assistive  device: Rolling walker (2 wheeled) Gait Pattern/deviations: Step-through pattern, Decreased stride length, Decreased weight shift to left General Gait Details: provided full body support hip to hip with total assist to keep grip on RW, pt able to advance LLE forward and backward with incr effort and time, noting instabilty in knee during wt shift and oversteers with R due to poor LUE control.  Fatigued by end of 5 foward 5 back   ADL: ADL Overall ADL's : Needs assistance/impaired Eating/Feeding: Minimal assistance, Sitting, Set up Grooming: Moderate assistance, Sitting Upper Body Bathing: Moderate assistance, Sitting Lower Body Bathing: Maximal assistance, +2 for safety/equipment Upper Body Dressing : Moderate assistance, Sitting Lower Body Dressing: Maximal assistance, +2 for safety/equipment, Sit to/from stand Toilet Transfer: Maximal assistance, Stand-pivot, BSC, RW Toilet Transfer Details (indicate cue type and reason): Simulated with EOB>recliner. Therapist providing steadying assist on [place], pt controlling rw with R hand only. Cues for timing of sitting into chair. General ADL Comments: Pt completed bed mobility, sat EOB several minutes, stood 2x from EOB, side stepped left then right, then completed pivotal steps to recliner. Discussed safety and strategies for LUE  neglect.  Cognition: Cognition Overall Cognitive Status: Within Functional Limits for tasks assessed Orientation Level: Oriented X4 Cognition Arousal/Alertness: Awake/alert Behavior During Therapy: WFL for tasks assessed/performed Overall Cognitive Status: Within Functional Limits for tasks assessed General Comments: pleasent and engaged  Blood pressure (!) 160/84, pulse 96, temperature 97.7 F (36.5 C), temperature source Oral, resp. rate 20, height 5\' 3"  (1.6 m), weight 58.1 kg, SpO2 94 %. Physical Exam  Vitals reviewed. Constitutional: She is oriented to person, place, and time. She appears well-developed and well-nourished.  HENT:  Right Ear: External ear normal.  Left Ear: External ear normal.  Bruising around left eye  Eyes: EOM are normal. Right eye exhibits no discharge. Left eye exhibits no discharge.  Pupils reactive to light  Neck: Normal range of motion. Neck supple. No thyromegaly present.  Cardiovascular: Normal rate and regular rhythm.  Respiratory: Effort normal and breath sounds normal. No respiratory distress.  GI: Soft. Bowel sounds are normal. She exhibits distension.  Musculoskeletal:     Comments: No edema or tenderness in extremities  Neurological: She is alert and oriented to person, place, and time.  Follows full commands Motor: Right upper extremity/right lower extremity: 5/5 proximal distal Left upper extremity: 4-/5 with apraxia proximal to distal Left lower extremity: 4-4+/5 proximal to distal  Skin:  See above  Psychiatric: She has a normal mood and affect. Her behavior is normal. Thought content normal.    LabResultsLast24Hours  No results found for this or any previous visit (from the past 24 hour(s)).    ImagingResults(Last48hours)  Mr Jeri Cos RC Contrast  Result Date: 07/21/2018 CLINICAL DATA:  Non-small cell lung cancer. Metastatic disease. Progression. Abnormal CT head without contrast EXAM: MRI HEAD WITHOUT AND WITH  CONTRAST TECHNIQUE: Multiplanar, multiecho pulse sequences of the brain and surrounding structures were obtained without and with intravenous contrast. CONTRAST:  5 mL Gadavist COMPARISON:  CT head without contrast 07/21/2018 FINDINGS: Brain: Multiple peripherally enhancing lesions are present with associated edema. Findings are consistent with metastatic disease to the brain. The lesion in the right parietal lobe measures 14 x 14 x 14 mm on image 14 of series 16. A posterior right parietal lesion measures 8 x 10 x 16 mm on image 40 of series 16. A medial anterior right frontal lobe lesion measures 21 x 17 x 22 mm on image 35 of series  16. A 3 mm right occipital lesion is best seen on the sagittal images. This is on image 7 of series 18. An anterior inferior left frontal lobe lesion measures 4 x 5 x 5 mm on image 26 of series 16. A left temporal lobe lesion measures 12 x 13 x 11 mm image 23 of series 16 A left anterior pontine lesion measures 8 x 8 x 9 mm on image 23 of series 16. A left cerebellar lesion measures 11 x 12 x 14 mm on image 21 of series 16. Restricted diffusion is present in the anterior right frontal lobe and lateral right parietal lobe lesions. There is also some restricted diffusion associated with the left temporal lobe lesion. The right frontal and parietal lobe lesions demonstrate the greatest degree of surrounding edema. There is some edema surrounding each of the other lesions as well. No acute infarct is present. Periventricular and subcortical white matter changes are present in addition to the edema surrounding the metastases. Vascular: Flow is present in the major intracranial arteries. Skull and upper cervical spine: The craniocervical junction is normal. Upper cervical spine is within normal limits. Marrow signal is unremarkable. Sinuses/Orbits: The paranasal sinuses and mastoid air cells are clear. Bilateral lens replacements are noted. Globes and orbits are otherwise unremarkable.  Other: 2 separate subcutaneous lesions are present lateral and superior to the left orbit. There is extensive surrounding edema. The lesions measure 13 x 15 and 14 x 14 mm respectively. IMPRESSION: 1. At least 8 parenchymal metastases as described above. 2. 2 left periorbital subcutaneous lesions with marked surrounding edema likely reflect subcutaneous metastases. 3. Mild atrophy and white matter disease otherwise likely reflects the sequela of chronic microvascular ischemia. Electronically Signed   By: San Morelle M.D.   On: 07/21/2018 11:18     Assessment/Plan: Diagnosis: Brain metastasis Labs and images (see above) independently reviewed.  Records reviewed and summated above.  1. Does the need for close, 24 hr/day medical supervision in concert with the patient's rehab needs make it unreasonable for this patient to be served in a less intensive setting? Yes  2. Co-Morbidities requiring supervision/potential complications: CKD stage III (avoid nephrotoxic meds), HTN (monitor and provide prns in accordance with increased physical exertion and pain), hyperlipidemia, non-small cell lung cancer with right upper lobe lobectomy 2009 recurrence in 2013, leukocytosis (repeat labs, cont to monitor for signs and symptoms of infection, further workup if indicated)  3. Due to bladder management, safety, skin/wound care, disease management, pain management and patient education, does the patient require 24 hr/day rehab nursing? Yes 4. Does the patient require coordinated care of a physician, rehab nurse, PT (1-2 hrs/day, 5 days/week) and OT (1-2 hrs/day, 5 days/week) to address physical and functional deficits in the context of the above medical diagnosis(es)? Yes Addressing deficits in the following areas: balance, endurance, locomotion, strength, transferring, bathing, dressing, toileting and psychosocial support 5. Can the patient actively participate in an intensive therapy program of at least 3  hrs of therapy per day at least 5 days per week? Yes 6. The potential for patient to make measurable gains while on inpatient rehab is excellent 7. Anticipated functional outcomes upon discharge from inpatient rehab are min assist  with PT, min assist with OT, n/a with SLP. 8. Estimated rehab length of stay to reach the above functional goals is: 18-23 days. 9. Anticipated D/C setting: Home 10. Anticipated post D/C treatments: HH therapy and Home excercise program 11. Overall Rehab/Functional Prognosis: good  RECOMMENDATIONS: This  patient's condition is appropriate for continued rehabilitative care in the following setting: CIR if caregiver support available upon discharge after completion of medical work-up. Patient has agreed to participate in recommended program. Yes Note that insurance prior authorization may be required for reimbursement for recommended care.  Comment: Rehab Admissions Coordinator to follow up.   I have personally performed a face to face diagnostic evaluation, including, but not limited to relevant history and physical exam findings, of this patient and developed relevant assessment and plan.  Additionally, I have reviewed and concur with the physician assistant's documentation above.   Delice Lesch, MD, ABPMR Jessica Paganini Angiulli, PA-C 07/23/2018        Revision History                   Routing History

## 2018-07-26 NOTE — Progress Notes (Addendum)
I stopped by to see the patient and her LUE is markedly improved. I'd say she has 4/5 strength and made changes in her Dexamethasone. She will finish today's 4th dose later this afternoon. She will start Dexamethasone 4 mg TID beginning tomorrow and orders were placed. If she has any progressive weakness after this adjustment, we would go back to q6 dosing.     Carola Rhine, PAC

## 2018-07-26 NOTE — Progress Notes (Signed)
[facility] Individual Statement of Services  Patient Name:  [name]  Date:  07/26/2018  Welcome to the [facility].  Our goal is to provide you with an individualized program based on your diagnosis and situation, designed to meet your specific needs.  With this comprehensive rehabilitation program, you will be expected to participate in at least 3 hours of rehabilitation therapies Monday-Friday, with modified therapy programming on the weekends.  Your rehabilitation program will include the following services:  Physical Therapy (PT), Occupational Therapy (OT), 24 hour per day rehabilitation nursing, Neuropsychology, Case Management (Social Worker), Rehabilitation Medicine, Nutrition Services and Pharmacy Services  Weekly team conferences will be held on Wednesdays to discuss your progress.  Your Social Worker will talk with you frequently to get your input and to update you on team discussions.  Team conferences with you and your family in attendance may also be held.  Expected length of stay:  7 to 10 days  Overall anticipated outcome:  Supervision with contact guard for stairs  Depending on your progress and recovery, your program may change. Your Social Worker will coordinate services and will keep you informed of any changes. Your Social Worker's name and contact numbers are listed  below.  The following services may also be recommended but are not provided by the [facility] will be made to provide these services after discharge if needed.  Arrangements include referral to agencies that provide these services.  Your insurance has been verified to be:  Medicare and [place] Your primary doctor is:  Dr. Wenda Low  Pertinent information will be shared with your doctor and your insurance company.  Social  Worker:  Alfonse Alpers, LCSW  [phone] or [phone]  Information discussed with and copy given to patient by: Trey Sailors, 07/26/2018, 9:18 PM

## 2018-07-26 NOTE — Progress Notes (Signed)
Occupational Therapy Session Note  Patient Details  Name: [name] MRN: [id number] Date of Birth: 12/30/1940  Today's Date: 07/26/2018 OT Individual Time: 1003-1100, 1300-1346, and 2831-5176 OT Individual Time Calculation (min): 57 min, 46 min, and 26 min   Short Term Goals: Week 1:  OT Short Term Goal 1 (Week 1): STG = LTGs due to ELOS  Skilled Therapeutic Interventions/Progress Updates:    1) Treatment session with focus on functional mobility, sit > stand, and functional use of LUE.  Pt received upright in w/c on the phone with her son.  Pt declined bathing/dressing but willing to engage in grooming at standing level.  Pt completed oral care in standing with CGA and cues to utilize LUE as gross assist to hold toothbrush when applying toothpaste.  Pt washed face and changed bandage on nose with setup assist.  Engaged in LUE NMR in sitting with focus on 3 jaw chuck and grasp strength when utilizing large pegs and peg board.  Pt able to pick pegs up from table, however demonstrated increased difficulty when picking them up from container requiring increased coordination of movement.  Pt reports hypersensitivity in fingers.  Pt utilized gross grasp to remove pegs, therapist encouraging attempts to pick up pegs with finger tips vs gross grasp.  Pt dropping pegs multiple times when attempting to use finger tips.  Pt able to pick up balls from egg container with Lt hand with increased time and multiple attempts, continuing to require use of gross grasp.  Pt returned to room transferred back to bed with min assist and left in semi-reclined position due to reports of fatigue from sitting up.  Pt left with all needs in reach.  2) Treatment session with focus on functional mobility.  Pt received supine in bed reporting need to toilet, pt with no c/o pain.  Completed bed mobility with CGA and donned shoes with set up assistance.  Ambulated to toilet with RW with min assist.  Pt completed transfer with  Min assist and completed toileting tasks with CGA with use of RUE only when completing clothing management, while stabilizing self with LUE on RW hand orthosis.  Pt completed hand hygiene in standing at sink, even able to open liquid soap bottle while holding bottle with Lt hand.  Engaged in functional mobility in [place] with RW around obstacles to challenge turning and management of RW in narrow spaces.  Pt continues to demonstrate mild Lt inattention by forgetting to remove LUE for RW orthosis and getting extremely close to obstacles on Lt during ambulation.  Returned to room, transferred to bed, and doffed pants in preparation for x-ray and dopplers.  Pt left with RN present to remove IV.  3) Treatment session with focus on education on energy conservation and safety awareness with home making tasks.  Pt received upright in bed reporting fatigue, but no pain.  Engaged in discussion regarding pt personal goals to achieve before d/c home.  Pt expressing desire to increase functional use of LUE and return to taking care of herself.  Pt reports understanding recommendation for supervision, but wanting only supervision but no physical assistance. Discussed current progress towards goals and challenged pt to increase use of LUE during functional tasks like grooming and even self-feeding. Pt demonstrating increased functional grasp during self-care tasks and recognizes progress.  Pt remained upright in bed with all needs in reach.  Therapy Documentation Precautions:  Precautions Precautions: Fall Restrictions Weight Bearing Restrictions: No General:   Vital Signs: Therapy Vitals  Temp: 98.2 F (36.8 C) Pulse Rate: 67 Resp: 18 BP: (!) 162/64 Patient Position (if appropriate): Sitting Oxygen Therapy O2 Device: Room Air Pain: Pain Assessment Pain Scale: 0-10 Pain Score: 0-No pain   Therapy/Group: Individual Therapy  Simonne Come 07/26/2018, 12:16 PM

## 2018-07-27 ENCOUNTER — Other Ambulatory Visit: Payer: Self-pay

## 2018-07-27 ENCOUNTER — Inpatient Hospital Stay (HOSPITAL_COMMUNITY): Payer: Medicare Other

## 2018-07-27 ENCOUNTER — Inpatient Hospital Stay (HOSPITAL_COMMUNITY): Payer: Medicare Other | Admitting: Physical Therapy

## 2018-07-27 ENCOUNTER — Inpatient Hospital Stay (HOSPITAL_COMMUNITY): Payer: Medicare Other | Admitting: Occupational Therapy

## 2018-07-27 DIAGNOSIS — R531 Weakness: Secondary | ICD-10-CM

## 2018-07-27 NOTE — IPOC Note (Signed)
Overall Plan of Care [facility]) Patient Details Name: [name] MRN: [id number] DOB: 1940/11/20  Admitting Diagnosis: <principal problem not specified>  Hospital Problems: Active Problems:   Brain metastases [facility] [address])   Palliative care by specialist   DNR (do not resuscitate)   Generalized weakness     Functional Problem List: Nursing Bladder, Endurance, Motor  PT Balance, Pain, Perception, Safety, Endurance, Edema, Sensory, Motor, Skin Integrity  OT Balance, Endurance, Motor, Perception, Safety, Sensory, Skin Integrity  SLP    TR         Basic ADL's: OT Eating, Grooming, Bathing, Dressing, Toileting     Advanced  ADL's: OT Simple Meal Preparation     Transfers: PT Bed Mobility, Bed to Chair, Car, Furniture, Floor  OT Toilet, Tub/Shower     Locomotion: PT Stairs, Ambulation     Additional Impairments: OT Fuctional Use of Upper Extremity  SLP        TR      Anticipated Outcomes Item Anticipated Outcome  Self Feeding Setup  Swallowing      Basic self-care  Supervision  Toileting  Supervision   Bathroom Transfers Supervision  Bowel/Bladder  independent with b/b  Transfers  supervision with LRAd  Locomotion  supervision with LRAD except stairs with 1 rail & CGA  Communication     Cognition     Pain  pain at or below 5 is pt baseline  Safety/Judgment  pt will remain safe and call for assistance as needed   Therapy Plan: PT Intensity: Minimum of 1-2 x/day ,45 to 90 minutes PT Frequency: 5 out of 7 days PT Duration Estimated Length of Stay: 7-10 days OT Intensity: Minimum of 1-2 x/day, 45 to 90 minutes OT Frequency: 5 out of 7 days OT Duration/Estimated Length of Stay: 7-10 days      Team Interventions: Nursing Interventions Patient/Family Education, Pain Management, Discharge Planning, Medication Management, Psychosocial Support, Bladder Management  PT interventions Ambulation/gait training, Cognitive remediation/compensation, Discharge planning,  DME/adaptive equipment instruction, Functional mobility training, Pain management, Psychosocial support, Splinting/orthotics, Therapeutic Activities, UE/LE Strength taining/ROM, Visual/perceptual remediation/compensation, Wheelchair propulsion/positioning, UE/LE Coordination activities, Therapeutic Exercise, Stair training, Patient/family education, Neuromuscular re-education, Functional electrical stimulation, Disease management/prevention, Academic librarian, Training and development officer, Skin care/wound management  OT Interventions Training and development officer, Discharge planning, Disease mangement/prevention, DME/adaptive equipment instruction, Functional mobility training, Neuromuscular re-education, Pain management, Patient/family education, Psychosocial support, Self Care/advanced ADL retraining, Skin care/wound managment, Therapeutic Activities, Therapeutic Exercise, UE/LE Strength taining/ROM, UE/LE Coordination activities  SLP Interventions    TR Interventions    SW/CM Interventions Discharge Planning, Psychosocial Support, Patient/Family Education   Barriers to Discharge MD  Medical stability and Pending chemo/radiation  Nursing Pending chemo/radiation    PT Pending chemo/radiation    OT      SLP      SW       Team Discharge Planning: Destination: PT-Home ,OT- Home , SLP-  Projected Follow-up: PT-Home health PT, 24 hour supervision/assistance, OT-  Home health OT, 24 hour supervision/assistance, SLP-  Projected Equipment Needs: PT-To be determined, OT- None recommended by OT, SLP-  Equipment Details: PT- , OT-pt has all equipment Patient/family involved in discharge planning: PT- Patient,  OT-Patient, SLP-   MD ELOS: 7-10d Medical Rehab Prognosis:  Excellent Assessment:  78 year old right-handed female with history of chronic renal insufficiency stage III, hypertension, hyperlipidemia, non-small cell lung cancer with right upper lobe lobectomy 2009 recurrence in 2013 that  was treated with radiation therapy and followed by oncology services. Per chart review and patient, patient  lives alone independent prior to admission for most tasks with assistance of children and grandchildren for other tasks. She does have family in good support. Presented 07/21/2018 after recent fall with left arm weakness. MRI the brain reviewed, showing intra-and extracranial brain masses. Per report, showed at least 8 parenchymal metastasis, to left periorbital subcutaneous brain lesions with marked surrounding edema likely reflecting subcutaneous metastasis. Oncology services follow-up Dr. Jana Hakim with plan for [facility] radiation therapy which was also discussed with Dr. Julien Nordmann and Dr. Lisbeth Renshaw of radiation oncology   Now requiring 24/7 Rehab RN,MD, as well as CIR level PT, OT and SLP.  Treatment team will focus on ADLs and mobility with goals set at Sup See Team Conference Notes for weekly updates to the plan of care

## 2018-07-27 NOTE — Progress Notes (Signed)
Physical Therapy Session Note  Patient Details  Name: [name] MRN: [id number] Date of Birth: Jun 21, 1941  Today's Date: 07/27/2018 PT Individual Time: 1304-1400 PT Individual Time Calculation (min): 56 min   Short Term Goals: Week 1:  PT Short Term Goal 1 (Week 1): Pt will ambulate 75 ft with LRAD & min assist. PT Short Term Goal 2 (Week 1): Pt will negotiate 8 steps with 1 rail and min assist.  PT Short Term Goal 3 (Week 1): Pt will complete all transfers with CGA.  Skilled Therapeutic Interventions/Progress Updates:   Pt received in bed & agreeable to tx. Pt able to don shoes independently once handed to her by therapist. Pt sit<>stand CGA and ambulates from bed>bathroom with RW and left hand orthosis with CGA and min assist for standing balance while pulling down/up pants. Pt with void bladder and able to perform self-hygiene independently. Pt wheeled to gym and transfers w/c<>mat with stand-pivot transfer and CGA. Pt stands and puts clothespins on a wire with only LUE use for strengthening and NMR. Pt also stands and performs cognitive planning activity with pipes and following a picture with min cues for correct pipe length based on picture. More focus was placed on LUE use during activity for NMR. Pt c/o lumbar pain during standing activities and requests a heating pad but declines more pain medication. PA notified of heating pad request and provided pt with back support for activities for remainder of session. Pt utilizes kinetron in sitting for lower extremity strengthening with pt concentration to maintain LUE grip on handrail and verbal cuing to keep left foot on pedal. Pt left in bed with bed alarm on and all needs in reach.   Therapy Documentation Precautions:  Precautions Precautions: Fall Restrictions Weight Bearing Restrictions: No   Therapy/Group: Individual Therapy  [name] 07/27/2018, 3:57 PM

## 2018-07-27 NOTE — Progress Notes (Signed)
[place] PHYSICAL MEDICINE & REHABILITATION PROGRESS NOTE   Subjective/Complaints: Appreciate rad onc PA note, reviewed imaging with pt  ROS- No CP, SOB, N/V/D  Objective:   Dg Femur Min 2 Views Left  Result Date: 07/26/2018 CLINICAL DATA:  Recent fall with left-sided pain, initial encounter EXAM: LEFT FEMUR 2 VIEWS COMPARISON:  None. FINDINGS: There is no evidence of fracture or other focal bone lesions. Soft tissues are unremarkable. IMPRESSION: No acute abnormality noted. Electronically Signed   By: Inez Catalina M.D.   On: 07/26/2018 14:53   Vas [place] Lower Extremity Venous (dvt)  Result Date: 07/26/2018  Lower Venous Study Indications: Thigh pain.  Performing Technologist: Abram Sander RVS  Examination Guidelines: A complete evaluation includes B-mode imaging, spectral Doppler, color Doppler, and power Doppler as needed of all accessible portions of each vessel. Bilateral testing is considered an integral part of a complete examination. Limited examinations for reoccurring indications may be performed as noted.  Right Venous Findings: +---+---------------+---------+-----------+----------+-------+    CompressibilityPhasicitySpontaneityPropertiesSummary +---+---------------+---------+-----------+----------+-------+ CFVFull           Yes      Yes                          +---+---------------+---------+-----------+----------+-------+  Left Venous Findings: +---------+---------------+---------+-----------+----------+-------+          CompressibilityPhasicitySpontaneityPropertiesSummary +---------+---------------+---------+-----------+----------+-------+ CFV      Full           Yes      Yes                          +---------+---------------+---------+-----------+----------+-------+ SFJ      Full                                                 +---------+---------------+---------+-----------+----------+-------+ FV Prox  Full                                                  +---------+---------------+---------+-----------+----------+-------+ FV Mid   Full                                                 +---------+---------------+---------+-----------+----------+-------+ FV DistalFull                                                 +---------+---------------+---------+-----------+----------+-------+ PFV      Full                                                 +---------+---------------+---------+-----------+----------+-------+ POP      Full           Yes      Yes                          +---------+---------------+---------+-----------+----------+-------+  PTV      Full                                                 +---------+---------------+---------+-----------+----------+-------+ PERO     Full                                                 +---------+---------------+---------+-----------+----------+-------+    Summary: Right: No evidence of common femoral vein obstruction. Left: There is no evidence of deep vein thrombosis in the lower extremity. No cystic structure found in the popliteal fossa.  *See table(s) above for measurements and observations. Electronically signed by Servando Snare MD on 07/26/2018 at 3:28:58 PM.    Final    Recent Labs    07/25/18 0520  WBC 10.6*  HGB 12.2  HCT 36.7  PLT 201   Recent Labs    07/25/18 0520  NA 137  K 3.8  CL 106  CO2 24  GLUCOSE 132*  BUN 32*  CREATININE 1.00  CALCIUM 9.0    Intake/Output Summary (Last 24 hours) at 07/27/2018 0803 Last data filed at 07/26/2018 1756 Gross per 24 hour  Intake 0 ml  Output -  Net 0 ml     Physical Exam: Vital Signs Blood pressure (!) 174/85, pulse 73, temperature 97.9 F (36.6 C), temperature source Oral, resp. rate 15, height 5\' 3"  (1.6 m), weight 58.6 kg, SpO2 98 %.   General: No acute distress Mood and affect are appropriate Heart: Regular rate and rhythm no rubs murmurs or extra sounds Lungs: Clear to auscultation,  breathing unlabored, no rales or wheezes Abdomen: Positive bowel sounds, soft nontender to palpation, nondistended Extremities: No clubbing, cyanosis, or edema Skin: No evidence of breakdown, no evidence of rash Neurologic: Cranial nerves II through XII intact, motor strength is 5/5 in RIght  deltoid, bicep, tricep, grip, hip flexor, knee extensors, ankle dorsiflexor and plantar flexor, 4/5 on left side   Musculoskeletal: Full range of motion in all 4 extremities. No joint swelling   Assessment/Plan: 1. 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  Physiatrist is providing close team supervision and 24 hour management of active medical problems listed below.  Physiatrist and rehab team continue to assess barriers to discharge/monitor patient progress toward functional and medical goals  Care Tool:  Bathing    Body parts bathed by patient: Face   Body parts bathed by helper: Right lower leg, Left lower leg, Right arm     Bathing assist Assist Level: Set up assist     Upper Body Dressing/Undressing Upper body dressing   What is the patient wearing?: Pull over shirt    Upper body assist Assist Level: Contact Guard/Touching assist    Lower Body Dressing/Undressing Lower body dressing      What is the patient wearing?: Underwear/pull up, Pants     Lower body assist Assist for lower body dressing: Moderate Assistance - Patient 50 - 74%     Toileting Toileting    Toileting assist Assist for toileting: Supervision/Verbal cueing     Transfers Chair/bed transfer  Transfers assist  Chair/bed transfer assist level: Supervision/Verbal cueing     Locomotion Ambulation   Ambulation assist      Assist level: Supervision/Verbal cueing Assistive device: Walker-rolling(w/ L hand orthosis and QC) Max distance: 150    Walk 10 feet activity   Assist     Assist level: Minimal Assistance - Patient > 75% Assistive device: Walker-rolling(w/ L hand orthosis)   Walk 50 feet activity   Assist    Assist level: Minimal Assistance - Patient > 75% Assistive device: Walker-rolling(w/ L hand orthosis)    Walk 150 feet activity   Assist Walk 150 feet activity did not occur: Safety/medical concerns  Assist level: Minimal Assistance - Patient > 75% Assistive device: Walker-rolling(w/ L hand orthosis)    Walk 10 feet on uneven surface  activity   Assist Walk 10 feet on uneven surfaces activity did not occur: Safety/medical concerns   Assist level: Moderate Assistance - Patient - 50 - 74%     Wheelchair     Assist Will patient use wheelchair at discharge?: No Type of Wheelchair: Manual Wheelchair activity did not occur: Safety/medical concerns  Wheelchair assist level: Minimal Assistance - Patient > 75% Max wheelchair distance: 150    Wheelchair 50 feet with 2 turns activity    Assist    Wheelchair 50 feet with 2 turns activity did not occur: Safety/medical concerns   Assist Level: Minimal Assistance - Patient > 75%   Wheelchair 150 feet activity     Assist Wheelchair 150 feet activity did not occur: Safety/medical concerns   Assist Level: Minimal Assistance - Patient > 75%      Medical Problem List and Plan: 1.Decreased functional mobility with left side weaknesssecondary to brain metastasisdue tonon-small cell lung cancer with right upper lobe lobectomy. Plan SRS radiation therapy per follow-up radiation oncology. Palliative care consult has been obtained -CIR PT, OT, ELOS 1/23 2. DVT Prophylaxis/Anticoagulation: SCD 3. Pain Management:Neurontin 600 mg daily at bedtime,Lodine 400 mg twice a day MSIR 7.5 mg every 4 hours as needed -pain left thigh no muscular tenderness Neg femur xray prior knee flims and CT neg for lesion, did have  Chondrocalcinosis 4. Mood:Provide emotional support 5. Neuropsych: This patientiscapable of making decisions on herown behalf. 6. Skin/Wound Care:Routine skin checks 7. Fluids/Electrolytes/Nutrition:Routine in and out's with follow-up chemistries.  -I 486ml , elevated BUN, encourage po, may need IVF at noc 8. Hypertension. Norvasc 10 mg daily, Coreg 25 mg twice a day, Cozaar 50mg  BID. Monitor with increased mobility Vitals:   07/26/18 1946 07/27/18 0449  BP: (!) 146/57 (!) 174/85  Pulse: 70 73  Resp: 16 15  Temp: (!) 97.4 F (36.3 C) 97.9 F (36.6 C)  SpO2: 99% 98%  lability- on home meds, may need to add another agent, avoid diuretic due to poor fluid intake 9. Hypothyroidism. Synthroid 10. Hyperlipidemia. Lipitor    LOS: 3 days A FACE TO FACE EVALUATION WAS PERFORMED  Charlett Blake 07/27/2018, 8:03 AM

## 2018-07-27 NOTE — Progress Notes (Signed)
Occupational Therapy Session Note  Patient Details  Name: [name] MRN: [id number] Date of Birth: Oct 12, 1940  Today's Date: 07/27/2018 OT Individual Time: 1115-1200 OT Individual Time Calculation (min): 45 min    Short Term Goals: Week 1:  OT Short Term Goal 1 (Week 1): STG = LTGs due to ELOS  Skilled Therapeutic Interventions/Progress Updates:    Treatment session with focus on functional transfers and use of LUE during functional tasks.  Pt received upright in w/c reporting need to toilet.  Pt ambulated to bathroom with RW and CGA. Pt demonstrated increased functional use of LUE during clothing management pre and post toileting.  Completed hand hygiene in standing with use of LUE to turn on water and to maintain grasp on soap when opening with Rt hand.  Engaged in [place] with focus on use of LUE as well as engaging in discussion regarding procedure for medication management.  Pt did not make any mistakes, however required 10:23 to complete task due to decreased grasp in LUE and processing aloud.  Pt returned to room and left upright in w/c with nurse tech present to set up for lunch.  Therapy Documentation Precautions:  Precautions Precautions: Fall Restrictions Weight Bearing Restrictions: No Pain: Pain Assessment Pain Scale: 0-10 Pain Score: 6  Pain Type: Chronic pain Pain Location: Back Pain Orientation: Lower Pain Descriptors / Indicators: Constant;Sore;Nagging Pain Frequency: Constant Pain Onset: On-going Patients Stated Pain Goal: 3 Pain Intervention(s): Medication (See eMAR)   Therapy/Group: Individual Therapy  Simonne Come 07/27/2018, 1:24 PM

## 2018-07-27 NOTE — Progress Notes (Signed)
Occupational Therapy Session Note  Patient Details  Name: [name] MRN: [id number] Date of Birth: [birth year]  Today's Date: 07/27/2018 OT Individual Time: 0700-0800 OT Individual Time Calculation (min): 60 min    Short Term Goals: Week 1:  OT Short Term Goal 1 (Week 1): STG = LTGs due to ELOS  Skilled Therapeutic Interventions/Progress Updates:    1;1. No pain reported throughout session. Pt very chatty throughout session occasionally requiring redirection to task. Pt completes ambualtion with RW to bathroom with S for transfer and min A for clothing management. Pt bathes at sink per pt request with S overall seated and standing for peri washing with VC for use of LUE for majority of bathing. Pt completes standing grooming at sink iwht seated rest break between items. Pt requires min A for LB dressing for HOH A to pull pants past L hip with LUE. Pt completes container management with VC for using LUE as assist to stabilize containers while  RUE peels off tops of breakfast tray items with minimal fumbling. Exited sessionw iht pt seated in w/c, belt alarm on and call lihgitn reach  Therapy Documentation Precautions:  Precautions Precautions: Fall Restrictions Weight Bearing Restrictions: No General:   Vital Signs: Therapy Vitals Temp: 97.9 F (36.6 C) Temp Source: Oral Pulse Rate: 73 Resp: 15 BP: (!) 174/85 Patient Position (if appropriate): Lying Oxygen Therapy SpO2: 98 % O2 Device: Room Air Pain:   ADL: ADL Grooming: Minimal assistance Where Assessed-Grooming: Standing at sink Upper Body Bathing: Minimal cueing, Setup Where Assessed-Upper Body Bathing: Sitting at sink Lower Body Bathing: Moderate assistance Where Assessed-Lower Body Bathing: Sitting at sink, Standing at sink Upper Body Dressing: Minimal cueing, Supervision/safety Where Assessed-Upper Body Dressing: Sitting at sink Lower Body Dressing: Minimal cueing, Moderate assistance Where Assessed-Lower Body  Dressing: Sitting at sink, Standing at sink Toilet Transfer: Minimal assistance Toilet Transfer Method: Stand pivot Toilet Transfer Equipment: Grab bars Vision   Perception    Praxis   Exercises:   Other Treatments:     Therapy/Group: Individual Therapy  Tonny Branch 07/27/2018, 7:24 AM

## 2018-07-27 NOTE — Progress Notes (Addendum)
I have read and reviewed the following documentation and am in agreement with the information.  This licensed clinician was present and actively directing the care throughout the session at all times.  Lavone Nian, PT, DPT    Physical Therapy Session Note  Patient Details  Name: [name] MRN: [id number] Date of Birth: 1940/07/31  Today's Date: 07/27/2018 PT Individual Time: 1001-1101 PT Individual Time Calculation (min): 60 min   Short Term Goals: Week 1:  PT Short Term Goal 1 (Week 1): Pt will ambulate 75 ft with LRAD & min assist. PT Short Term Goal 2 (Week 1): Pt will negotiate 8 steps with 1 rail and min assist.  PT Short Term Goal 3 (Week 1): Pt will complete all transfers with CGA.  Skilled Therapeutic Interventions/Progress Updates:   Pt received in w/c and agreeable to tx. Pt c/o pain in left ring finger after artificial nail broke off earlier this morning and band-aid provided. Pt able to put band-aid on finger independently. Pt also c/o bandage where IV was removed, min assistance was provided by therapist to pull up sleeve and remove it. Pt transfers from w/c<>standing with min assist and question cuing for hand placement and left hand orthosis. Pt ambulates to and from bathroom with CGA and able to pull down/up pants with min assist for standing balance. Pt with void bladder and able to perform self-hygiene independently. Pt wheeled to gym for time management. Pt ambulates 100 + 100 ft with RW and left hand orthosis and CGA. Tactile cuing was provided by therapist during ambulation on left hip to correct slight Trendelenburg gait pattern. Foam pad added to left hand orthosis due to pt c/o of rubbing, and pt states it feels much better. Pt ascends and descends 4 stairs with 2 handrails and CGA with verbal cuing for left arm inattention on railing. Pt performs lateral stepping along railing in hallway 30 ft each direction for lateral hip functional strengthening with CGA and  verbal cuing for left arm inattention on railing. Pt left sitting up in w/c with chair alarm donned and all needs in reach.   Therapy Documentation Precautions:  Precautions Precautions: Fall Restrictions Weight Bearing Restrictions: No   Therapy/Group: Individual Therapy  Abby Lewis 07/27/2018, 1:02 PM

## 2018-07-27 NOTE — Progress Notes (Signed)
Social Work  Assessment and Plan  Patient Details  Name: [name] MRN: [id number] Date of Birth: 1941-01-31  Today's Date: 07/25/2018  Problem List:  Patient Active Problem List   Diagnosis Date Noted  . Palliative care by specialist   . DNR (do not resuscitate)   . Brain metastases ([place]) 07/24/2018  . Dyslipidemia   . Malignant neoplasm of right lung ([place])   . Leukocytosis   . Brain metastasis ([place]) 07/21/2018  . Left hemiparesis ([place]) 07/21/2018  . Lung cancer metastatic to brain ([place]) 07/21/2018  . Acute upper GI bleed   . Anemia due to acute blood loss   . Closed flail chest   . CAD (coronary artery disease) 09/21/2017  . Coronary stent thrombosis   . Endotracheal tube present   . CAD in native artery 09/20/2017  . Elevated serum creatinine 04/15/2016  . Acute kidney injury superimposed on chronic kidney disease ([place]) 03/24/2016  . Coronary artery calcification seen on CT scan   . CKD (chronic kidney disease), stage III ([place])   . Hypertension   . Hypothyroidism 03/15/2016  . Hyperlipidemia 08/19/2015  . Cancer of upper lobe of left lung ([place])   . Recurrent lung adenocarcinoma ([place]) 10/01/2014  . Local recurrence of left lung cancer ([place])   . Carotid artery disease without cerebral infarction ([place]) 05/20/2013  . Atherosclerosis of aorta ([place]) 05/10/2013  . Macular degeneration 05/10/2013  . Allergic rhinitis 10/20/2012  . Cancer of upper lobe of lung ([place]) 09/28/2011  . Melanoma ([place])   . LBBB (left bundle branch block)   . Gastritis due to nonsteroidal anti-inflammatory drug 06/20/2011  . Hyperthyroidism   . Tremor   . Abdominal pain, RUQ 02/10/2011  . GERD with stricture 02/08/2011  . GOITER, MULTINODULAR 09/25/2009  . URI 09/02/2009  . Essential hypertension, benign 12/01/2008   Past Medical History:  Past Medical History:  Diagnosis Date  . Anemia 09/2017  . Arthritis    back.and feet  . Cervical ca ([place]) dx'd 1988   surg only  . CKD (chronic  kidney disease), stage III ([place])   . Complication of anesthesia    difficulty awakening, dysrhythmia post surgery, (prolonged sedation level)  . Coronary artery calcification seen on CT scan   . Dyspnea   . GERD (gastroesophageal reflux disease)   . Headache    occ. sinus type headaches  . History of hiatal hernia   . Hx of radiation therapy 09/16/13-09/30/13   prevascular lymph node  . Hyperlipidemia   . Hypertension   . Hyperthyroidism    dr Loanne Drilling- radioactive iodine treatment 2011- resolved  . Hypothyroidism   . LBBB (left bundle branch block)   . Lung cancer ([place])    Bilaterally, RUL lobectomy and spot involving both lungs- "cancer"  . Macular degeneration    bilateral  . Melanoma ([place])    left leg  . Radiation 10/11/11-10/21/11   Left upper lobe adenocarcinoma  . Tremor    hand- intention tremor   Past Surgical History:  Past Surgical History:  Procedure Laterality Date  . basal cell and squamous cell skin cell area removed     new left lower eye lid done with skin graft   . CARDIAC CATHETERIZATION  09/21/2017  . CATARACT EXTRACTION, BILATERAL Bilateral   . COLONOSCOPY WITH PROPOFOL N/A 10/27/2016   Procedure: COLONOSCOPY WITH PROPOFOL;  Surgeon: Laurence Spates, MD;  Location: [facility];  Service: Endoscopy;  Laterality: N/A;  . conization for cervical dysplasia    .  CORONARY STENT INTERVENTION N/A 09/21/2017   Procedure: CORONARY STENT INTERVENTION;  Surgeon: Belva Crome, MD;  Location: [facility];  Service: Cardiovascular;  Laterality: N/A;  . CORONARY/GRAFT ACUTE MI REVASCULARIZATION N/A 09/21/2017   Procedure: Coronary/Graft Acute MI Revascularization;  Surgeon: Belva Crome, MD;  Location: [facility];  Service: Cardiovascular;  Laterality: N/A;  . ESOPHAGOGASTRODUODENOSCOPY (EGD) WITH PROPOFOL N/A 09/28/2017   Procedure: ESOPHAGOGASTRODUODENOSCOPY (EGD) WITH PROPOFOL;  Surgeon: Arta Silence, MD;  Location: [facility];  Service: Endoscopy;   Laterality: N/A;  . ESOPHAGOGASTRODUODENOSCOPY (EGD) WITH PROPOFOL N/A 09/30/2017   Procedure: ESOPHAGOGASTRODUODENOSCOPY (EGD) WITH PROPOFOL;  Surgeon: Otis Brace, MD;  Location: [facility];  Service: Gastroenterology;  Laterality: N/A;  . esophogus stretching    . GIVENS CAPSULE STUDY N/A 10/06/2017   Procedure: GIVENS CAPSULE STUDY;  Surgeon: Arta Silence, MD;  Location: [facility] ENDOSCOPY;  Service: Endoscopy;  Laterality: N/A;  . INTRAVASCULAR ULTRASOUND/IVUS N/A 09/21/2017   Procedure: Intravascular Ultrasound/IVUS;  Surgeon: Belva Crome, MD;  Location: [facility] [facility] LAB;  Service: Cardiovascular;  Laterality: N/A;  . IR GENERIC HISTORICAL  09/11/2014   IR RADIOLOGIST EVAL & MGMT 09/11/2014 Jacqulynn Cadet, MD [facility] INTERV RAD  . LEFT HEART CATH AND CORONARY ANGIOGRAPHY N/A 09/21/2017   Procedure: LEFT HEART CATH AND CORONARY ANGIOGRAPHY;  Surgeon: Belva Crome, MD;  Location: [place] [facility] LAB;  Service: Cardiovascular;  Laterality: N/A;  . LEFT HEART CATH AND CORONARY ANGIOGRAPHY N/A 09/21/2017   Procedure: LEFT HEART CATH AND CORONARY ANGIOGRAPHY;  Surgeon: Belva Crome, MD;  Location: [facility];  Service: Cardiovascular;  Laterality: N/A;  . radio frequency ablation on lung spot    . rul lobectomy  2010  . TONSILLECTOMY    . TONSILLECTOMY     Social History:  reports that she quit smoking about 42 years ago. Her smoking use included cigarettes. She has a 17.00 pack-year smoking history. She has never used smokeless tobacco. She reports current alcohol use. She reports that she does not use drugs.  Family / Support Systems Marital Status: Widow/Widower How Long?: 18 months Patient Roles: Parent, Other (Comment)(sister; grandmother) Children: Lendora Keys - son - [phone]; Cherylann Parr - dtr - [phone] Anticipated Caregiver: son, daughter, brother, other family, [facility] [employer] Ability/Limitations of Caregiver: Family willing to provide care.  Has a long  term care policy and pt plans to hire help as needed. Caregiver Availability: 24/7 Family Dynamics: close, supportive family  Social History Preferred language: English Religion: Non-Denominational Education: college Read: Yes Write: Yes Employment Status: Retired Date Retired/Disabled/Unemployed: none reported Public relations account executive Issues: none reported Guardian/Conservator: N/A - MD has determined that pt is capable of making herown decisions.   Abuse/Neglect Abuse/Neglect Assessment Can Be Completed: Yes Physical Abuse: Denies Verbal Abuse: Denies Sexual Abuse: Denies Exploitation of patient/patient's resources: Denies Self-Neglect: Denies  Emotional Status Pt's affect, behavior and adjustment status: Pt is in good spirits and determined to get through this. Recent Psychosocial Issues: Pt's husband was a pt on CIR x 3, so CSW knows pt well.  He died in 02-01-17. Psychiatric History: none reported Substance Abuse History: none reported  Patient / Family Perceptions, Expectations & Goals Pt/Family understanding of illness & functional limitations: Pt/children have a good understanding of pt's condition/limitations. Premorbid pt/family roles/activities: Pt has had a hard year, but has still been able to enjoy friends and family. Anticipated changes in roles/activities/participation: Pt wants to resume spending time with family/friends as she  is able. Pt/family expectations/goals: Pt wants to get well and get back to taking care of herself.  Community [employer] Agencies: None Premorbid Home Care/DME Agencies: Other (Comment)(Pt has all needed DME and had HH, but doesn't remember her [place] agency she's had in the past.) Transportation available at discharge: family Resource referrals recommended: Neuropsychology, Support group (specify)  Discharge Planning Living Arrangements: Alone Support Systems: Children, Friends/neighbors, Other relatives, Home care  staff Type of Residence: Private residence Insurance Resources: [employer], Multimedia programmer (specify)(AARP as secondary) Financial Resources: Radio broadcast assistant Screen Referred: No Money Management: Patient Does the patient have any problems obtaining your medications?: No Home Management: Pt has some help with home management and can continue to have it. Patient/Family Preliminary Plans: Pt plans to return to her home with [facility] caregivers and pt's family to provide supervision. Social Work Anticipated Follow Up Needs: HH/OP Expected length of stay: 7 to 10 days  Clinical Impression CSW met with pt to re-introduce self and role of CSW, as well as to complete assessment.  This CSW worked with pt when her husband was on CIR x3, so she is well known to [place].  She is motivated and upbeat and ready to work hard on CIR and get home.  She plans to hire [facility] [employer] caregivers, as she did for her husband before his death and for herself over the past year when she has experienced various medical challenges.  Pt is open to [facility], but has all DME.  She cannot remember the [facility] agency, but liked their therapists.  CSW is trying to determine which agency she used in the past so that a new referral can be made.  Pt/family have no needs/concerns/questions at this time, but will reach out to CSW as needed.  CSW will continue to follow and plan for pt's d/c next week.  Christean Silvestri, Silvestre Mesi 07/27/2018, 12:15 AM

## 2018-07-27 NOTE — Progress Notes (Addendum)
Patient ID: [name], female   DOB: 05-May-1941, 78 y.o.   MRN: [id number]  This NP visited patient at the bedside as a follow up to  yesterday's [place].  Patient is alert and oriented.  Pleasant conversation regarding her progress in rehab.  Left hand control improving.  She anticipates radiation next week and discharge home.  Strong family support.  Ongoing discussed with patient the importance of continued conversation with her  family and the medical providers regarding overall plan of care and treatment options,  ensuring decisions are within the context of the patients values and GOCs.  Questions and concerns addressed   Discussed with Worthy Flank PA-C Radiation Oncology  Total time spent on the unit was 25 minutes   Will f/u next week uncalled called  Greater than 50% of the time was spent in counseling and coordination of care  Wadie Lessen NP  Palliative Medicine Team Team Phone # [phone] Pager [phone]

## 2018-07-28 ENCOUNTER — Inpatient Hospital Stay (HOSPITAL_COMMUNITY): Payer: Medicare Other | Admitting: Occupational Therapy

## 2018-07-28 ENCOUNTER — Inpatient Hospital Stay (HOSPITAL_COMMUNITY): Payer: Medicare Other | Admitting: Physical Therapy

## 2018-07-28 DIAGNOSIS — R531 Weakness: Secondary | ICD-10-CM

## 2018-07-28 DIAGNOSIS — R7989 Other specified abnormal findings of blood chemistry: Secondary | ICD-10-CM

## 2018-07-28 NOTE — Plan of Care (Signed)
  Problem: Consults Goal: RH BRAIN INJURY PATIENT EDUCATION Description Description: See Patient Education module for eduction specifics Outcome: Progressing   Problem: RH BOWEL ELIMINATION Goal: RH STG MANAGE BOWEL WITH ASSISTANCE Description STG Manage Bowel with mod I Assistance.  Outcome: Progressing   Problem: RH BLADDER ELIMINATION Goal: RH STG MANAGE BLADDER WITH ASSISTANCE Description STG Manage Bladder With mod I Assistance  Outcome: Progressing   Problem: RH SAFETY Goal: RH STG ADHERE TO SAFETY PRECAUTIONS W/ASSISTANCE/DEVICE Description STG Adhere to Safety Precautions With mod I Assistance/Device.  Outcome: Progressing   Problem: RH PAIN MANAGEMENT Goal: RH STG PAIN MANAGED AT OR BELOW PT'S PAIN GOAL Description < 4  Outcome: Progressing   Problem: RH KNOWLEDGE DEFICIT BRAIN INJURY Goal: RH STG INCREASE KNOWLEDGE OF SELF CARE AFTER BRAIN INJURY Description Patient/family will verbalize/demonstrate understanding of safe self care strategies as outlined by rehab staff with mod I assist.  Outcome: Progressing

## 2018-07-28 NOTE — Progress Notes (Addendum)
Physical Therapy Session Note  Patient Details  Name: [name] MRN: [id number] Date of Birth: [birth year]  Today's Date: 07/28/2018 PT Individual Time: 0703-0818 PT Individual Time Calculation (min): 75 min   Short Term Goals: Week 1:  PT Short Term Goal 1 (Week 1): Pt will ambulate 75 ft with LRAD & min assist. PT Short Term Goal 2 (Week 1): Pt will negotiate 8 steps with 1 rail and min assist.  PT Short Term Goal 3 (Week 1): Pt will complete all transfers with CGA.  Skilled Therapeutic Interventions/Progress Updates:  Pt received asleep in bed requiring extra time to fully awaken. Pt requests coffee & orange juice & therapist provides it for her; focused on pt holding drink stable with LUE to grasp and open containers with RUE. Discussed pt's need to allow extra time to consume meals and prepare for appointments upon d/c. Pt transfers to EOB with supervision and transfers to w/c with RW & L hand orthosis with close supervision<>CGA. Transported pt to gym via w/c dependent assist for time management. Pt completes sit<>stand with supervision, managing LUE on hand orthosis with cuing to attend to extremity during stand>sit. Pt ambulates 100 ft + 200 ft with RW & L hand orthosis with supervision and decreased gait speed. Pt completed Berg Balance Test & scored 35/56; educated pt on interpretation of score & current fall risk. Patient demonstrates increased fall risk as noted by score of 35/56 on Berg Balance Scale.  (<36= high risk for falls, close to 100%; 37-45 significant >80%; 46-51 moderate >50%; 52-55 lower >25%). At end of session pt left sitting in w/c in room with alarm donned & set up with meal tray, needs in reach.  During session pt discusses diagnosis with therapist providing therapeutic listening & encouragement.   Pain: pt reports pain in L thigh from fall but states she is premedicated.    Therapy Documentation Precautions:  Precautions Precautions:  Fall Restrictions Weight Bearing Restrictions: No   Balance: Balance Balance Assessed: Yes Standardized Balance Assessment Standardized Balance Assessment: Berg Balance Test Berg Balance Test Sit to Stand: Able to stand without using hands and stabilize independently Standing Unsupported: Able to stand 2 minutes with supervision Sitting with Back Unsupported but Feet Supported on Floor or Stool: Able to sit safely and securely 2 minutes Stand to Sit: Sits safely with minimal use of hands Transfers: Able to transfer with verbal cueing and /or supervision Standing Unsupported with Eyes Closed: Able to stand 10 seconds with supervision Standing Ubsupported with Feet Together: Able to place feet together independently and stand for 1 minute with supervision From Standing, Reach Forward with Outstretched Arm: Can reach forward >12 cm safely (5") From Standing Position, Pick up Object from Floor: Able to pick up shoe, needs supervision From Standing Position, Turn to Look Behind Over each Shoulder: Turn sideways only but maintains balance Turn 360 Degrees: Needs close supervision or verbal cueing Standing Unsupported, Alternately Place Feet on Step/Stool: Able to complete >2 steps/needs minimal assist Standing Unsupported, One Foot in Front: Able to take small step independently and hold 30 seconds Standing on One Leg: Unable to try or needs assist to prevent fall Total Score: 35     Therapy/Group: Individual Therapy  Waunita Schooner 07/28/2018, 8:25 AM

## 2018-07-28 NOTE — Progress Notes (Signed)
Occupational Therapy Session Note  Patient Details  Name: [name] MRN: [id number] Date of Birth: 1940-11-30  Today's Date: 07/28/2018 OT Individual Time: 0093-8182 and 1305-1400 OT Individual Time Calculation (min): 60 min and 55 min   Short Term Goals: Week 1:  OT Short Term Goal 1 (Week 1): STG = LTGs due to ELOS  Skilled Therapeutic Interventions/Progress Updates:    1) Treatment session with focus on ADL retraining and functional use of LUE during self-care tasks.  Pt received upright in w/c agreeable to therapy session.  Pt with no c/o pain.  Pt reports not mentally ready to bathe at shower level, but agreeable to bathing at sink.  Pt completed all bathing and dressing at sit > stand level with supervision, except CGA when standing to pull pants over hips.  Pt reports need to toilet.  Ambulated to toilet with RW with Lt hand orthosis with CGA when navigating threshold and completing transfer to toilet.  Pt able to complete hygiene and clothing management with CGA.  Engaged in [place] in standing with focus on reaching and grasp with clothespins.  Pt demonstrated increased difficulty when using BUE (Lt to obtain clothespin and Rt to manage card held by clothespin.  Modified task to only managing one item at a time with noted improvements in motor control with LUE.  Min cues to visually attend to LUE during grasp and release of clothespins, especially when attempting to place clothespin on target.  Returned to room transferred back to bed.  Pt left semi-reclined with all needs in reach.  2) Treatment session with focus on LUE NMR during structured and functional tasks.  Pt received upright in bed with friend visiting.  Pt reports no c/o pain.  Pt completed bed mobility with supervision and ambulated to bathroom with RW with Lt hand orthosis with close supervision.  Pt completed transfer and toileting with close supervision this session.  Pt completed hand hygiene in standing at sink,  utilizing LUE to turn on/off water and then reach for paper towels.  Pt pleased with continued improvements in functional use of LUE.  Engaged in fine motor and gross grasp tasks in sitting with use of pegs in resistive peg board and stringing beads.  Pt demonstrates continued difficulty when picking up and managing smaller items.  Engaged in reaching activity at cabinet to simulate home making tasks of putting away dishes or groceries with use of LUE.  Utilized LUE and BUE during reaching activity to simulate various sized items.  Pt demonstrates difficulty managing and maintaining grasp with LUE when completing bimanual task.  Pt returned to room and transferred back to bed with CGA.  Pt left semi-reclined in bed with all needs in reach.  Therapy Documentation Precautions:  Precautions Precautions: Fall Restrictions Weight Bearing Restrictions: No Pain: Pain Assessment Pain Scale: 0-10 Pain Score: 0-No pain   Therapy/Group: Individual Therapy  Simonne Come 07/28/2018, 11:44 AM

## 2018-07-28 NOTE — Progress Notes (Signed)
[place] PHYSICAL MEDICINE & REHABILITATION PROGRESS NOTE   Subjective/Complaints: Patient is up with physical therapy in the gym this morning.  No new complaints.  Happy with progress.  Pain control.  ROS: Patient denies fever, rash, sore throat, blurred vision, nausea, vomiting, diarrhea, cough, shortness of breath or chest pain, joint or back pain, headache, or mood change.    Objective:   Dg Femur Min 2 Views Left  Result Date: 07/26/2018 CLINICAL DATA:  Recent fall with left-sided pain, initial encounter EXAM: LEFT FEMUR 2 VIEWS COMPARISON:  None. FINDINGS: There is no evidence of fracture or other focal bone lesions. Soft tissues are unremarkable. IMPRESSION: No acute abnormality noted. Electronically Signed   By: Inez Catalina M.D.   On: 07/26/2018 14:53   Vas [place] Lower Extremity Venous (dvt)  Result Date: 07/26/2018  Lower Venous Study Indications: Thigh pain.  Performing Technologist: Abram Sander RVS  Examination Guidelines: A complete evaluation includes B-mode imaging, spectral Doppler, color Doppler, and power Doppler as needed of all accessible portions of each vessel. Bilateral testing is considered an integral part of a complete examination. Limited examinations for reoccurring indications may be performed as noted.  Right Venous Findings: +---+---------------+---------+-----------+----------+-------+    CompressibilityPhasicitySpontaneityPropertiesSummary +---+---------------+---------+-----------+----------+-------+ CFVFull           Yes      Yes                          +---+---------------+---------+-----------+----------+-------+  Left Venous Findings: +---------+---------------+---------+-----------+----------+-------+          CompressibilityPhasicitySpontaneityPropertiesSummary +---------+---------------+---------+-----------+----------+-------+ CFV      Full           Yes      Yes                           +---------+---------------+---------+-----------+----------+-------+ SFJ      Full                                                 +---------+---------------+---------+-----------+----------+-------+ FV Prox  Full                                                 +---------+---------------+---------+-----------+----------+-------+ FV Mid   Full                                                 +---------+---------------+---------+-----------+----------+-------+ FV DistalFull                                                 +---------+---------------+---------+-----------+----------+-------+ PFV      Full                                                 +---------+---------------+---------+-----------+----------+-------+ POP      Full  Yes      Yes                          +---------+---------------+---------+-----------+----------+-------+ PTV      Full                                                 +---------+---------------+---------+-----------+----------+-------+ PERO     Full                                                 +---------+---------------+---------+-----------+----------+-------+    Summary: Right: No evidence of common femoral vein obstruction. Left: There is no evidence of deep vein thrombosis in the lower extremity. No cystic structure found in the popliteal fossa.  *See table(s) above for measurements and observations. Electronically signed by Servando Snare MD on 07/26/2018 at 3:28:58 PM.    Final    No results for input(s): WBC, HGB, HCT, PLT in the last 72 hours. No results for input(s): NA, K, CL, CO2, GLUCOSE, BUN, CREATININE, CALCIUM in the last 72 hours.  Intake/Output Summary (Last 24 hours) at 07/28/2018 0950 Last data filed at 07/28/2018 0901 Gross per 24 hour  Intake 1019 ml  Output -  Net 1019 ml     Physical Exam: Vital Signs Blood pressure (!) 140/94, pulse 77, temperature 97.8 F (36.6 C), temperature source  Oral, resp. rate 15, height '5\' 3"'  (1.6 m), weight 59.3 kg, SpO2 98 %.   Constitutional: No distress . Vital signs reviewed. HEENT: EOMI, oral membranes moist, hematoma dissipating along left eye with some gravitation towards mandible Neck: supple Cardiovascular: RRR without murmur. No JVD    Respiratory: CTA Bilaterally without wheezes or rales. Normal effort    GI: BS +, non-tender, non-distended   Ext: no edema Skin: No evidence of breakdown, no evidence of rash Neurologic: Cranial nerves II through XII intact, motor strength is 5/5 in RIght  deltoid, bicep, tricep, grip, hip flexor, knee extensors, ankle dorsiflexor and plantar flexor, 4/5 on left side--motor and sensory exam stable   Musculoskeletal: Full range of motion in all 4 extremities. No joint swelling   Assessment/Plan: 1. 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  Physiatrist is providing close team supervision and 24 hour management of active medical problems listed below.  Physiatrist and rehab team continue to assess barriers to discharge/monitor patient progress toward functional and medical goals  Care Tool:  Bathing    Body parts bathed by patient: Right arm, Left arm, Abdomen, Chest, Front perineal area, Buttocks, Right upper leg, Left upper leg, Right lower leg, Left lower leg, Face   Body parts bathed by helper: Right lower leg, Left lower leg, Right arm     Bathing assist Assist Level: Supervision/Verbal cueing     Upper Body Dressing/Undressing Upper body dressing   What is the patient wearing?: Pull over shirt    Upper body assist Assist Level: Supervision/Verbal cueing    Lower Body Dressing/Undressing Lower body dressing      What is the patient wearing?: Underwear/pull up, Pants  Lower body assist Assist for lower body dressing:  Minimal Assistance - Patient > 75%     Toileting Toileting    Toileting assist Assist for toileting: Contact Guard/Touching assist     Transfers Chair/bed transfer  Transfers assist     Chair/bed transfer assist level: Supervision/Verbal cueing     Locomotion Ambulation   Ambulation assist      Assist level: Supervision/Verbal cueing Assistive device: Walker-rolling Max distance: 200 ft    Walk 10 feet activity   Assist     Assist level: Supervision/Verbal cueing Assistive device: Walker-rolling   Walk 50 feet activity   Assist    Assist level: Supervision/Verbal cueing Assistive device: Walker-rolling    Walk 150 feet activity   Assist Walk 150 feet activity did not occur: Safety/medical concerns  Assist level: Supervision/Verbal cueing Assistive device: Walker-rolling    Walk 10 feet on uneven surface  activity   Assist Walk 10 feet on uneven surfaces activity did not occur: Safety/medical concerns   Assist level: Moderate Assistance - Patient - 50 - 74%     Wheelchair     Assist Will patient use wheelchair at discharge?: No Type of Wheelchair: Manual Wheelchair activity did not occur: Safety/medical concerns  Wheelchair assist level: Minimal Assistance - Patient > 75% Max wheelchair distance: 150    Wheelchair 50 feet with 2 turns activity    Assist    Wheelchair 50 feet with 2 turns activity did not occur: Safety/medical concerns   Assist Level: Minimal Assistance - Patient > 75%   Wheelchair 150 feet activity     Assist Wheelchair 150 feet activity did not occur: Safety/medical concerns   Assist Level: Minimal Assistance - Patient > 75%      Medical Problem List and Plan: 1.Decreased functional mobility with left side weaknesssecondary to brain metastasisdue tonon-small cell lung cancer with right upper lobe lobectomy. Plan SRS radiation therapy per follow-up radiation oncology. Palliative care consult  has been obtained -CIR PT, OT, ELOS 1/23 2. DVT Prophylaxis/Anticoagulation: SCD 3. Pain Management:Neurontin 600 mg daily at bedtime,Lodine 400 mg twice a day MSIR 7.5 mg every 4 hours as needed -pain left thigh: no muscular tenderness Neg femur xray prior knee flims and CT neg for lesion, did have Chondrocalcinosis  -Denies increases in pain today 4. Mood:Provide emotional support 5. Neuropsych: This patientiscapable of making decisions on herown behalf. 6. Skin/Wound Care:Routine skin checks 7. Fluids/Electrolytes/Nutrition:Routine in and out's with follow-up chemistries.  -I 1000cc yesterday , elevated BUN  -continue to push fluids  -Recheck be met on Monday 8. Hypertension. Norvasc 10 mg daily, Coreg 25 mg twice a day, Cozaar 20m BID. Monitor with increased mobility Vitals:   07/27/18 2043 07/28/18 0513  BP: (!) 152/59 (!) 140/94  Pulse: 75 77  Resp: 16 15  Temp: (!) 97.5 F (36.4 C) 97.8 F (36.6 C)  SpO2: 97% 98%  Still somewhat labile, observe for now. 9. Hypothyroidism. Synthroid 10. Hyperlipidemia. Lipitor    LOS: 4 days A FACE TO FACE EVALUATION WAS PERFORMED  ZMeredith Staggers1/18/2020, 9:50 AM

## 2018-07-29 ENCOUNTER — Inpatient Hospital Stay (HOSPITAL_COMMUNITY): Payer: Medicare Other

## 2018-07-29 MED ORDER — ALBUTEROL SULFATE (2.5 MG/3ML) 0.083% IN NEBU: 2.5000 mg | INHALATION_SOLUTION | RESPIRATORY_TRACT | Status: DC | PRN

## 2018-07-29 MED ORDER — BUDESONIDE 0.25 MG/2ML IN SUSP
0.2500 mg | Freq: Two times a day (BID) | RESPIRATORY_TRACT | Status: DC
  Administered 2018-07-29 – 2018-08-02 (×9): 0.25 mg via RESPIRATORY_TRACT
  Filled 2018-07-29 (×10): qty 2

## 2018-07-29 NOTE — Progress Notes (Signed)
Occupational Therapy Session Note  Patient Details  Name: [name] MRN: [id number] Date of Birth: 05-Mar-1941  Today's Date: 07/29/2018 OT Group Time: 1300-1400 OT Group Time Calculation (min): 60 min   Skilled Therapeutic Interventions/Progress Updates:    pt participated in skilled OT group working on social participation, bimanual tasks, [place], LUE NMR, and divided attention. Pt does not have any complaints of pain, however demo some SOB while talking and completing activities at the same time, however does not c/o of dizziness. Pt sits (declines standing) while making no sew blanket activity cutting fabric into strips and tying ~100 knots over course of session for improvement of [place] and LUE NMR. Initially pt demo decrease grip/pinch strength, however once in state of flow pt able to talk and complete activity at the same time. Exited session with pt seated in bed, call light in reach and all needs met  Therapy Documentation Precautions:  Precautions Precautions: Fall Restrictions Weight Bearing Restrictions: No  Therapy/Group: Group Therapy  Tonny Branch 07/29/2018, 4:22 PM

## 2018-07-29 NOTE — Progress Notes (Signed)
[place] PHYSICAL MEDICINE & REHABILITATION PROGRESS NOTE   Subjective/Complaints: Pt complains of slight increase in her shortness of breath.  She is noticing it somewhat while at rest.  Has had occasional wheezing as well.  ROS: Patient denies fever, rash, sore throat, blurred vision, nausea, vomiting, diarrhea, cough,   chest pain, joint or back pain, headache, or mood change.   Objective:   No results found. No results for input(s): WBC, HGB, HCT, PLT in the last 72 hours. No results for input(s): NA, K, CL, CO2, GLUCOSE, BUN, CREATININE, CALCIUM in the last 72 hours.  Intake/Output Summary (Last 24 hours) at 07/29/2018 9675 Last data filed at 07/28/2018 2300 Gross per 24 hour  Intake 1200 ml  Output -  Net 1200 ml     Physical Exam: Vital Signs Blood pressure 133/87, pulse 72, temperature (!) 97.4 F (36.3 C), temperature source Oral, resp. rate 16, height 5\' 3"  (1.6 m), weight 59 kg, SpO2 98 %.   Constitutional: No distress . Vital signs reviewed. HEENT: EOMI, oral membranes moist Neck: supple Cardiovascular: RRR without murmur. No JVD    Respiratory: CTA Bilaterally with occasional wheeze, no rales.  Slight dyspnea while speaking GI: BS +, non-tender, non-distended   Ext: no edema Skin: No evidence of breakdown, no evidence of rash Neurologic: Cranial nerves II through XII intact, motor strength is 5/5 in RIght  deltoid, bicep, tricep, grip, hip flexor, knee extensors, ankle dorsiflexor and plantar flexor, 4/5 on left side--motor and sensory exam stable   Musculoskeletal: Full range of motion in all 4 extremities. No joint swelling   Assessment/Plan: 1. 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  Physiatrist is providing close team supervision and 24 hour management of active medical problems listed  below.  Physiatrist and rehab team continue to assess barriers to discharge/monitor patient progress toward functional and medical goals  Care Tool:  Bathing    Body parts bathed by patient: Right arm, Left arm, Abdomen, Chest, Front perineal area, Buttocks, Right upper leg, Left upper leg, Right lower leg, Left lower leg, Face   Body parts bathed by helper: Right lower leg, Left lower leg, Right arm     Bathing assist Assist Level: Supervision/Verbal cueing     Upper Body Dressing/Undressing Upper body dressing   What is the patient wearing?: Pull over shirt    Upper body assist Assist Level: Supervision/Verbal cueing    Lower Body Dressing/Undressing Lower body dressing      What is the patient wearing?: Pants     Lower body assist Assist for lower body dressing: Contact Guard/Touching assist     Toileting Toileting    Toileting assist Assist for toileting: Contact Guard/Touching assist     Transfers Chair/bed transfer  Transfers assist     Chair/bed transfer assist level: Contact Guard/Touching assist     Locomotion Ambulation   Ambulation assist      Assist level: Supervision/Verbal cueing Assistive device: Walker-rolling Max distance: 200 ft    Walk 10 feet activity   Assist     Assist level: Supervision/Verbal cueing Assistive device: Walker-rolling   Walk 50 feet activity   Assist    Assist level: Supervision/Verbal cueing Assistive device: Walker-rolling    Walk 150 feet activity   Assist Walk 150 feet activity did not occur: Safety/medical concerns  Assist level: Supervision/Verbal cueing Assistive device: Walker-rolling  Walk 10 feet on uneven surface  activity   Assist Walk 10 feet on uneven surfaces activity did not occur: Safety/medical concerns   Assist level: Moderate Assistance - Patient - 50 - 74%     Wheelchair     Assist Will patient use wheelchair at discharge?: No Type of Wheelchair:  Manual Wheelchair activity did not occur: Safety/medical concerns  Wheelchair assist level: Minimal Assistance - Patient > 75% Max wheelchair distance: 150    Wheelchair 50 feet with 2 turns activity    Assist    Wheelchair 50 feet with 2 turns activity did not occur: Safety/medical concerns   Assist Level: Minimal Assistance - Patient > 75%   Wheelchair 150 feet activity     Assist Wheelchair 150 feet activity did not occur: Safety/medical concerns   Assist Level: Minimal Assistance - Patient > 75%      Medical Problem List and Plan: 1.Decreased functional mobility with left side weaknesssecondary to brain metastasisdue tonon-small cell lung cancer with right upper lobe lobectomy. Plan SRS radiation therapy per follow-up radiation oncology. Palliative care consult has been obtained -CIR PT, OT, ELOS 1/23 2. DVT Prophylaxis/Anticoagulation: SCD 3. Pain Management:Neurontin 600 mg daily at bedtime,Lodine 400 mg twice a day MSIR 7.5 mg every 4 hours as needed -pain left thigh: no muscular tenderness Neg femur xray prior knee flims and CT neg for lesion, did have Chondrocalcinosis  -Pain under control 4. Mood:Provide emotional support 5. Neuropsych: This patientiscapable of making decisions on herown behalf. 6. Skin/Wound Care:Routine skin checks 7. Fluids/Electrolytes/Nutrition:Routine in and out's with follow-up chemistries.  -1200cc yesterday , elevated BUN  -continue to push fluids  -Recheck BMET on Monday 8. Hypertension. Norvasc 10 mg daily, Coreg 25 mg twice a day, Cozaar 50mg  BID. Monitor with increased mobility Vitals:   07/28/18 2045 07/29/18 0432  BP: (!) 155/65 133/87  Pulse: 75 72  Resp: 16 16  Temp: 97.9 F (36.6 C) (!) 97.4 F (36.3 C)  SpO2: 98% 98%  Borderline control, observe 9. Hypothyroidism. Synthroid 10. Hyperlipidemia. Lipitor 12. Dyspnea: Lung exam fairly unremarkable today except for  mild wheezing  -Add scheduled Pulmicort nebulizer and as needed albuterol nebulizers   -Consider follow-up chest x-ray  LOS: 5 days A FACE TO FACE EVALUATION WAS PERFORMED  Meredith Staggers 07/29/2018, 8:22 AM

## 2018-07-30 ENCOUNTER — Inpatient Hospital Stay (HOSPITAL_COMMUNITY): Payer: Medicare Other | Admitting: Physical Therapy

## 2018-07-30 ENCOUNTER — Inpatient Hospital Stay (HOSPITAL_COMMUNITY): Payer: Medicare Other | Admitting: Occupational Therapy

## 2018-07-30 ENCOUNTER — Inpatient Hospital Stay (HOSPITAL_COMMUNITY): Payer: Medicare Other

## 2018-07-30 ENCOUNTER — Encounter: Payer: Self-pay | Admitting: *Deleted

## 2018-07-30 DIAGNOSIS — Z66 Do not resuscitate: Secondary | ICD-10-CM

## 2018-07-30 DIAGNOSIS — C3412 Malignant neoplasm of upper lobe, left bronchus or lung: Secondary | ICD-10-CM | POA: Diagnosis not present

## 2018-07-30 DIAGNOSIS — Z515 Encounter for palliative care: Secondary | ICD-10-CM

## 2018-07-30 LAB — BASIC METABOLIC PANEL
ANION GAP: 8 (ref 5–15)
BUN: 26 mg/dL — ABNORMAL HIGH (ref 8–23)
CO2: 26 mmol/L (ref 22–32)
Calcium: 8.9 mg/dL (ref 8.9–10.3)
Chloride: 104 mmol/L (ref 98–111)
Creatinine, Ser: 0.93 mg/dL (ref 0.44–1.00)
GFR calc Af Amer: >60 mL/min (ref >60)
GFR calc non Af Amer: 59 mL/min — ABNORMAL LOW (ref >60)
Glucose, Bld: 128 mg/dL — ABNORMAL HIGH (ref 70–99)
Potassium: 4.4 mmol/L (ref 3.5–5.1)
Sodium: 138 mmol/L (ref 135–145)

## 2018-07-30 NOTE — Progress Notes (Signed)
  Radiation Oncology         ([phone]) [phone] ________________________________  Name: [name] MRN: [id number]  Date: 07/24/2018  DOB: 12/01/1940  DIAGNOSIS:     ICD-10-CM   1. Brain metastasis ([place]) C79.31     NARRATIVE:  The patient was brought to the [facility].  Identity was confirmed.  All relevant records and images related to the planned course of therapy were reviewed.  The patient freely provided informed written consent to proceed with treatment after reviewing the details related to the planned course of therapy. The consent form was witnessed and verified by the simulation staff. Intravenous access was established for contrast administration. Then, the patient was set-up in a stable reproducible supine position for radiation therapy.  A relocatable thermoplastic stereotactic head frame was fabricated for precise immobilization.  CT images were obtained.  Surface markings were placed.  The CT images were loaded into the planning software and fused with the patient's targeting MRI scan.  Then the target and avoidance structures were contoured.  Treatment planning then occurred.  The radiation prescription was entered and confirmed.  I have requested 3D planning  I have requested a DVH of the following structures: Brain stem, brain, left eye, right eye, lenses, optic chiasm, target volumes, uninvolved brain, and normal tissue.    SPECIAL TREATMENT PROCEDURE:  The planned course of therapy using radiation constitutes a special treatment procedure. Special care is required in the management of this patient for the following reasons. This treatment constitutes a Special Treatment Procedure for the following reason: High dose per fraction requiring special monitoring for increased toxicities of treatment including daily imaging.  The special nature of the planned course of radiotherapy will require increased physician supervision and oversight to ensure patient's safety with  optimal treatment outcomes.  PLAN:  The patient will receive 20 Gy in 1 fraction to 8 small lesions, 18 Gy to the largest lesion, and 14 Gy to the lesion within the brainstem.   ------------------------------------------------  Jodelle Gross, MD, PhD

## 2018-07-30 NOTE — Progress Notes (Signed)
Physical Therapy Session Note  Patient Details  Name: [name] MRN: [id number] Date of Birth: [birth year]  Today's Date: 07/30/2018 PT Individual Time: 0900-1000 PT Individual Time Calculation (min): 60 min   Short Term Goals: Week 1:  PT Short Term Goal 1 (Week 1): Pt will ambulate 75 ft with LRAD & min assist. PT Short Term Goal 2 (Week 1): Pt will negotiate 8 steps with 1 rail and min assist.  PT Short Term Goal 3 (Week 1): Pt will complete all transfers with CGA.  Skilled Therapeutic Interventions/Progress Updates:  Pt received in bed & agreeable to tx. No c/o pain at rest. Pt transfers to EOB with supervision and completes sit<>stand with supervision with occasional cuing to attend to LUE during stand>sit. Pt ambulates room<>gym with RW, L hand orthosis and close supervision. Pt with DOE and educated on pursed lip breathing, SpO2 WNL throughout session. In gym, pt stood on compliant surface while engaging in pipe tree task with activity focusing on standing balance with pt requiring min assist, problem solving & error correction with pt requiring min cuing, and attention to L environment and LUE. Pt engaged in zoom ball in standing with activity focusing on using LUE in functional manner, standing balance, and standing endurance. Pt requires seated rest breaks with back support throughout session 2/2 decreased endurance & fatigue. Pt returns to room & request to get dressed; pt dons shirt & pants sitting>standing EOB with supervision. Pt performed standing therapeutic exercises consisting of hip/knee flexion & heel raises with BUE support on RW with task focusing on BLE strengthening and rest breaks PRN 2/2 SOB. At end of session pt left in bed with alarm set & needs at hand.   Therapy Documentation Precautions:  Precautions Precautions: Fall Restrictions Weight Bearing Restrictions: No    Therapy/Group: Individual Therapy  Waunita Schooner 07/30/2018, 10:04 AM

## 2018-07-30 NOTE — Progress Notes (Signed)
[place] PHYSICAL MEDICINE & REHABILITATION PROGRESS NOTE   Subjective/Complaints:   SOme  Dyspnea with speaking and with activity, no swelling in legs, no further wean of steroid  ROS: Patient denies fever, , nausea, vomiting, diarrhea, cough,   chest pain,   Objective:   No results found. No results for input(s): WBC, HGB, HCT, PLT in the last 72 hours. Recent Labs    07/30/18 0638  NA 138  K 4.4  CL 104  CO2 26  GLUCOSE 128*  BUN 26*  CREATININE 0.93  CALCIUM 8.9    Intake/Output Summary (Last 24 hours) at 07/30/2018 0824 Last data filed at 07/30/2018 0550 Gross per 24 hour  Intake 1080 ml  Output -  Net 1080 ml     Physical Exam: Vital Signs Blood pressure (!) 160/85, pulse 72, temperature 98.3 F (36.8 C), temperature source Oral, resp. rate 18, height 5\' 3"  (1.6 m), weight 59.2 kg, SpO2 96 %.   Constitutional: No distress . Vital signs reviewed. HEENT: EOMI, oral membranes moist Neck: supple Cardiovascular: RRR without murmur. No JVD    Respiratory: CTA Bilaterally with occasional wheeze, no rales.  Slight dyspnea while speaking GI: BS +, non-tender, non-distended   Ext: no edema Skin: No evidence of breakdown, no evidence of rash Neurologic: Cranial nerves II through XII intact, motor strength is 5/5 in RIght  deltoid, bicep, tricep, grip, hip flexor, knee extensors, ankle dorsiflexor and plantar flexor, 4/5 on left side--motor and sensory exam stable  Ext - neg C/C/E Musculoskeletal: Full range of motion in all 4 extremities. No joint swelling   Assessment/Plan: 1. 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  Physiatrist is providing close team supervision and 24 hour management of active medical problems listed below.  Physiatrist and rehab team continue to assess barriers to discharge/monitor  patient progress toward functional and medical goals  Care Tool:  Bathing    Body parts bathed by patient: Right arm, Left arm, Abdomen, Chest, Front perineal area, Buttocks, Right upper leg, Left upper leg, Right lower leg, Left lower leg, Face   Body parts bathed by helper: Right lower leg, Left lower leg, Right arm     Bathing assist Assist Level: Supervision/Verbal cueing     Upper Body Dressing/Undressing Upper body dressing   What is the patient wearing?: Pull over shirt    Upper body assist Assist Level: Supervision/Verbal cueing    Lower Body Dressing/Undressing Lower body dressing      What is the patient wearing?: Pants     Lower body assist Assist for lower body dressing: Contact Guard/Touching assist     Toileting Toileting    Toileting assist Assist for toileting: Contact Guard/Touching assist     Transfers Chair/bed transfer  Transfers assist     Chair/bed transfer assist level: Contact Guard/Touching assist     Locomotion Ambulation   Ambulation assist      Assist level: Supervision/Verbal cueing Assistive device: Walker-rolling Max distance: 200 ft    Walk 10 feet activity   Assist     Assist level: Supervision/Verbal cueing Assistive device: Walker-rolling   Walk 50 feet activity   Assist    Assist level: Supervision/Verbal cueing Assistive device: Walker-rolling    Walk 150 feet activity   Assist Walk 150 feet activity did not occur: Safety/medical concerns  Assist level: Supervision/Verbal cueing Assistive device: Walker-rolling  Walk 10 feet on uneven surface  activity   Assist Walk 10 feet on uneven surfaces activity did not occur: Safety/medical concerns   Assist level: Moderate Assistance - Patient - 50 - 74%     Wheelchair     Assist Will patient use wheelchair at discharge?: No Type of Wheelchair: Manual Wheelchair activity did not occur: Safety/medical concerns  Wheelchair assist level:  Minimal Assistance - Patient > 75% Max wheelchair distance: 150    Wheelchair 50 feet with 2 turns activity    Assist    Wheelchair 50 feet with 2 turns activity did not occur: Safety/medical concerns   Assist Level: Minimal Assistance - Patient > 75%   Wheelchair 150 feet activity     Assist Wheelchair 150 feet activity did not occur: Safety/medical concerns   Assist Level: Minimal Assistance - Patient > 75%      Medical Problem List and Plan: 1.Decreased functional mobility with left side weaknesssecondary to brain metastasisdue tonon-small cell lung cancer with right upper lobe lobectomy. Plan SRS radiation therapy per follow-up radiation oncology. Palliative care consult has been obtained -CIR PT, OT, ELOS 1/23 2. DVT Prophylaxis/Anticoagulation: SCD 3. Pain Management:Neurontin 600 mg daily at bedtime,Lodine 400 mg twice a day MSIR 7.5 mg every 4 hours as needed -pain left thigh: no muscular tenderness Neg femur xray prior knee flims and CT neg for lesion, did have Chondrocalcinosis  -Pain under control 4. Mood:Provide emotional support 5. Neuropsych: This patientiscapable of making decisions on herown behalf. 6. Skin/Wound Care:Routine skin checks 7. Fluids/Electrolytes/Nutrition:Routine in and out's with follow-up chemistries.  -improving po intake  -continue to push fluids  -Recheck BMET BUN 26-improved 8. Hypertension. Norvasc 10 mg daily, Coreg 25 mg twice a day, Cozaar 50mg  BID. Monitor with increased mobility Vitals:   07/30/18 0700 07/30/18 0806  BP: (!) 160/85   Pulse:    Resp:    Temp:    SpO2:  96%  Borderline control, consider med adjustment 9. Hypothyroidism. Synthroid 10. Hyperlipidemia. Lipitor 12. Dyspnea: Lung exam fairly unremarkable today except for mild wheezing  -Add scheduled Pulmicort nebulizer and as needed albuterol nebulizers   -Check chest x-ray Last ECHO 6578 dr 1 diastolic  dysfx, normal Ej fx LOS: 6 days A FACE TO FACE EVALUATION WAS PERFORMED  Charlett Blake 07/30/2018, 8:24 AM

## 2018-07-30 NOTE — Progress Notes (Addendum)
Physical Therapy Session Note  Patient Details  Name: [name] MRN: [id number] Date of Birth: 1940/09/07  Today's Date: 07/30/2018 PT Individual Time: 1302-1417 PT Individual Time Calculation (min): 75 min   Short Term Goals: Week 1:  PT Short Term Goal 1 (Week 1): Pt will ambulate 75 ft with LRAD & min assist. PT Short Term Goal 2 (Week 1): Pt will negotiate 8 steps with 1 rail and min assist.  PT Short Term Goal 3 (Week 1): Pt will complete all transfers with CGA.  Skilled Therapeutic Interventions/Progress Updates:   Pt received in bed and agreeable to tx. Pt transfers sit<>stand with supervision and ambulates to bathroom with RW and L hand orthosis to perform toilet transfer with supervision. Pt voids bladder and performs self-hygiene tasks independently. Pt ambulates from room<>gym with RW and L hand orthosis with close supervision. Pt performs PNF patterns with BUEs while holding a ball to incorporate LUE NMR and trunk rotation. Pt c/o SOB throughout session and rest breaks provided, SpO2 WNL taken with pulse oximeter following standing activities. Therapist cued pt for pursed lip breathing during rest breaks. Pt performs laundry tasks of hanging pillow cases and towels up on a line with clothespins and then folding them as a functional strengthening task for the LUE while standing for endurance. Pt stands and taps 4" step with alternating legs for balance, weight shifting technique, and LE strengthening. Pt left lying in bed with bed alarm on and all needs in reach.  No complaints of pain during session.  Therapy Documentation Precautions:  Precautions Precautions: Fall Restrictions Weight Bearing Restrictions: No Vital Signs: Therapy Vitals Pulse Rate: 80 Oxygen Therapy SpO2: 99 % O2 Device: Room Air   Therapy/Group: Individual Therapy  Tiphany Fayson 07/30/2018, 4:04 PM

## 2018-07-30 NOTE — Progress Notes (Signed)
Oncology Nurse Navigator Documentation  Oncology Nurse Navigator Flowsheets 07/30/2018  Navigator Location [facility]  Navigator Encounter Type Other/I received a message from Rad Onc to have Dr. Julien Nordmann see patient.  Dr. Julien Nordmann updated [place] both that patient has refused all systemic therapies due to interlance.  Dr. Julien Nordmann will have follow up with her in 3 weeks to discuss options again.  I notified scheduling to call and arrange an appt.   Treatment Phase Follow-up  Barriers/Navigation Needs Coordination of Care  Interventions Coordination of Care  Coordination of Care Other  Acuity Level 2  Time Spent with Patient 30

## 2018-07-30 NOTE — Progress Notes (Signed)
Occupational Therapy Session Note  Patient Details  Name: [name] MRN: [id number] Date of Birth: [birth year]  Today's Date: 07/30/2018 OT Individual Time: 0704-0800 OT Individual Time Calculation (min): 56 min    Short Term Goals: Week 1:  OT Short Term Goal 1 (Week 1): STG = LTGs due to ELOS  Skilled Therapeutic Interventions/Progress Updates:    Upon entering the room, pt supine in bed with no c/o pain. She just received breakfast tray and requesting to eat first. Pt able to demonstrate B coordination task but cutting french toast and sausage with fork and knife. Pt required increased time but able to complete task. While eating, OT discussed energy conservation methods and pt described typical routine day. Pt already utilizes many concepts of energy conservation at home for safety. Pt performed supine >sit with supervision and ambulating with RW and close supervision 10' into bathroom.  Min guard for standing balance when managing clothing and hygiene with toileting needs this session. Pt standing at sink for hand hygiene and to brush teeth with min guard for standing balance. Pt returning to bed secondary to fatigue per pt request. Sit >supine with supervision. Bed alarm activated and call bell within reach upon exiting the room.   Therapy Documentation Precautions:  Precautions Precautions: Fall Restrictions Weight Bearing Restrictions: No    ADL: ADL Grooming: Minimal assistance Where Assessed-Grooming: Standing at sink Upper Body Bathing: Minimal cueing, Setup Where Assessed-Upper Body Bathing: Sitting at sink Lower Body Bathing: Moderate assistance Where Assessed-Lower Body Bathing: Sitting at sink, Standing at sink Upper Body Dressing: Minimal cueing, Supervision/safety Where Assessed-Upper Body Dressing: Sitting at sink Lower Body Dressing: Minimal cueing, Moderate assistance Where Assessed-Lower Body Dressing: Sitting at sink, Standing at sink Toilet Transfer:  Minimal assistance Toilet Transfer Method: Stand pivot Toilet Transfer Equipment: Grab bars   Therapy/Group: Individual Therapy  Gypsy Decant 07/30/2018, 2:20 PM

## 2018-07-31 ENCOUNTER — Telehealth: Payer: Self-pay | Admitting: Internal Medicine

## 2018-07-31 ENCOUNTER — Inpatient Hospital Stay (HOSPITAL_COMMUNITY): Payer: Medicare Other | Admitting: Occupational Therapy

## 2018-07-31 ENCOUNTER — Encounter (HOSPITAL_COMMUNITY): Payer: Medicare Other | Admitting: Psychology

## 2018-07-31 ENCOUNTER — Inpatient Hospital Stay (HOSPITAL_COMMUNITY): Payer: Medicare Other

## 2018-07-31 ENCOUNTER — Inpatient Hospital Stay (HOSPITAL_COMMUNITY): Payer: Medicare Other | Admitting: Physical Therapy

## 2018-07-31 NOTE — Progress Notes (Signed)
Occupational Therapy Session Note  Patient Details  Name: [name] MRN: [id number] Date of Birth: 1940/12/03  Today's Date: 07/31/2018 OT Individual Time: 0805-0900 and 3358-2518 OT Individual Time Calculation (min): 55 min and 24 min   Short Term Goals: Week 1:  OT Short Term Goal 1 (Week 1): STG = LTGs due to ELOS  Skilled Therapeutic Interventions/Progress Updates:    1) Treatment session with focus on ADL retraining and d/c planning.  Pt received upright in bed finishing breakfast with no c/o pain.  Pt reports discussion with MD regarding elevated BP and pt concerns regarding too many medications vs risk of stroke.  Pt able to advocate for herself and reports interested in session with neuro psych this afternoon, while she reports she feels she is coping well with everything at this point.  Engaged in discussion regarding energy conservation and pursed lip breathing during activity.  Pt ambulated to bathroom with RW and hand orthosis with supervision.  Pt completed toileting tasks with supervision with improved use of LUE.  Pt able to utilize LUE with increased gross and fine motor control during grooming tasks and UB bathing and dressing.  Pt requesting to return to bed and end of session to rest before next therapy session.  Pt with SOB throughout session, increasing with activity but able to recall breathing strategy and encouraged to pace herself.    2) Treatment session with focus on functional use of LUE.  Pt with no c/o pain.  Pt completed stand pivot transfer to w/c with supervision.  Engaged in [place] 4 in sitting with use of LUE to pick up checker pieces and place them in Connect 4 frame.  Pt demonstrating increased gross and fine motor control, requiring visual attention to picking up checkers from flat on table.  Pt demonstrated increased difficulty when attempting to stack checkers on top of each other, able to remove without knocking over stack.  Pt reports mild increase in  difficulty when attention is divided.  Pt passed off to PT.  Therapy Documentation Precautions:  Precautions Precautions: Fall Restrictions Weight Bearing Restrictions: No General:   Vital Signs: Oxygen Therapy SpO2: 98 % O2 Device: Room Air Pain:  Pt with no c/o pain   Therapy/Group: Individual Therapy  Simonne Come 07/31/2018, 12:09 PM

## 2018-07-31 NOTE — Progress Notes (Signed)
Occupational Therapy Session Note  Patient Details  Name: [name] MRN: [id number] Date of Birth: 08-Dec-1940  Today's Date: 07/31/2018 OT Individual Time: 1300-1358 OT Individual Time Calculation (min): 58 min    Short Term Goals: Week 1:  OT Short Term Goal 1 (Week 1): STG = LTGs due to ELOS  Skilled Therapeutic Interventions/Progress Updates:    Session focused on fall prevention, L UE NMR, and dynamic standing balance. Pt completed simulated walk in shower transfer with minimal instruction and cueing for safety, CGA overall. Discussed various ideas for fall prevention including bars and slip resistant mats. Pt then completed bimanual pipe tree for L UE NMR while standing on non compliant mat to challenge standing balance. Pt completed 100 ft of functional mobility x2 during session with RW at (S) level. Extensive discussion with pt re fall risk reduction at home and safety, including the use of a call system and importance of (S). Encouraged pt to complete floor transfer however she declined. Pt returned to room and completed toileting tasks with (S). Pt returned to  Bed and left with all  Needs met, bed alarm set.   Therapy Documentation Precautions:  Precautions Precautions: Fall Restrictions Weight Bearing Restrictions: No   Vital Signs: Oxygen Therapy SpO2: 98 % O2 Device: Room Air Pain: Pain Assessment Pain Scale: 0-10 Pain Score: 0-No pain   Therapy/Group: Individual Therapy  [name] 07/31/2018, 1:34 PM

## 2018-07-31 NOTE — Progress Notes (Signed)
Physical Therapy Session Note  Patient Details  Name: [name] MRN: [id number] Date of Birth: [birth year]  Today's Date: 07/31/2018 PT Individual Time: 1032-1127 PT Individual Time Calculation (min): 55 min   Short Term Goals: Week 1:  PT Short Term Goal 1 (Week 1): Pt will ambulate 75 ft with LRAD & min assist. PT Short Term Goal 2 (Week 1): Pt will negotiate 8 steps with 1 rail and min assist.  PT Short Term Goal 3 (Week 1): Pt will complete all transfers with CGA.  Skilled Therapeutic Interventions/Progress Updates:   Pt received from OT and agreeable to tx. Pt wheeled to gym for time management. Pt sits<>stands with supervision and ambulates to stairs with RW and L hand orthosis. Pt navigates RW to stairs and requires verbal cuing for transition from RW to handrail at stairs. Pt ascends/descends 8 stairs with 2 handrails and CGA progressing to supervision. Pt demonstrates better awareness of L hand on railing and correct leading foot when ascending and descending stairs. Pt ambulates from gym>day room>room with RW and L hand orthosis and supervision. Pt performs putty exercises with L hand focusing on fine motor skills, strengthening, and finger coordination while standing on foam for balance challenge. Pt left lying in bed with bed alarm on and son present to supervise with all needs in reach.   Pt c/o SOB towards the end of session and rest break provided.  Therapy Documentation Precautions:  Precautions Precautions: Fall Restrictions Weight Bearing Restrictions: No   Therapy/Group: Individual Therapy  Chiquetta Langner 07/31/2018, 11:49 AM

## 2018-07-31 NOTE — Progress Notes (Signed)
[place] PHYSICAL MEDICINE & REHABILITATION PROGRESS NOTE   Subjective/Complaints:  Discussed BP with pt , reviewed last HTN clinic note.  Discussed with palliative.  Pt still has some dyspnea but no worse mainly with exercise but sometimes with talking and eating.  CXR result reviewed . No CP.  Tolerating PT, OT well   ROS: Patient denies fever, , nausea, vomiting, diarrhea, cough,   chest pain,   Objective:   Dg Chest 2 View  Result Date: 07/30/2018 CLINICAL DATA:  Recent fall with shortness of Breath EXAM: CHEST - 2 VIEW COMPARISON:  06/18/2018 FINDINGS: Cardiac shadow is stable. Aortic calcifications are again seen. Right lung is clear. Persistent left upper lobe mass is noted stable from the prior exam. Postsurgical changes in the right hilum are again noted. No effusions are seen. Old rib fractures on the right are noted with healing. IMPRESSION: Stable left upper lobe mass lesion. Electronically Signed   By: Inez Catalina M.D.   On: 07/30/2018 14:52   No results for input(s): WBC, HGB, HCT, PLT in the last 72 hours. Recent Labs    07/30/18 0638  NA 138  K 4.4  CL 104  CO2 26  GLUCOSE 128*  BUN 26*  CREATININE 0.93  CALCIUM 8.9    Intake/Output Summary (Last 24 hours) at 07/31/2018 0623 Last data filed at 07/31/2018 0537 Gross per 24 hour  Intake 0 ml  Output 3 ml  Net -3 ml     Physical Exam: Vital Signs Blood pressure (!) 186/81, pulse 76, temperature 98.4 F (36.9 C), temperature source Oral, resp. rate 16, height 5\' 3"  (1.6 m), weight 58.3 kg, SpO2 100 %.   Constitutional: No distress . Vital signs reviewed. HEENT: EOMI, oral membranes moist Neck: supple Cardiovascular: RRR without murmur. No JVD    Respiratory: CTA Bilaterally with occasional wheeze, no rales.  Slight dyspnea while speaking GI: BS +, non-tender, non-distended   Ext: no edema Skin: No evidence of breakdown, no evidence of rash Neurologic: Cranial nerves II through XII intact, motor  strength is 5/5 in RIght  deltoid, bicep, tricep, grip, hip flexor, knee extensors, ankle dorsiflexor and plantar flexor, 4/5 on left side--motor and sensory exam stable  Ext - neg C/C/E Musculoskeletal: Full range of motion in all 4 extremities. No joint swelling   Assessment/Plan: 1. 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  Physiatrist is providing close team supervision and 24 hour management of active medical problems listed below.  Physiatrist and rehab team continue to assess barriers to discharge/monitor patient progress toward functional and medical goals  Care Tool:  Bathing    Body parts bathed by patient: Right arm, Left arm, Abdomen, Chest, Front perineal area, Buttocks, Right upper leg, Left upper leg, Right lower leg, Left lower leg, Face   Body parts bathed by helper: Right lower leg, Left lower leg, Right arm     Bathing assist Assist Level: Supervision/Verbal cueing     Upper Body Dressing/Undressing Upper body dressing   What is the patient wearing?: Pull over shirt    Upper body assist Assist Level: Set up assist    Lower Body Dressing/Undressing Lower body dressing      What is the patient wearing?: Pants     Lower body assist Assist for lower body dressing: Contact Guard/Touching assist     Toileting Toileting  Toileting assist Assist for toileting: Contact Guard/Touching assist     Transfers Chair/bed transfer  Transfers assist     Chair/bed transfer assist level: Contact Guard/Touching assist     Locomotion Ambulation   Ambulation assist      Assist level: Supervision/Verbal cueing Assistive device: Walker-rolling(L hand orthosis) Max distance: 150 ft    Walk 10 feet activity   Assist     Assist level: Supervision/Verbal cueing Assistive device: Walker-rolling(L  hand orthosis)   Walk 50 feet activity   Assist    Assist level: Supervision/Verbal cueing Assistive device: Walker-rolling(L hand orthosis)    Walk 150 feet activity   Assist Walk 150 feet activity did not occur: Safety/medical concerns  Assist level: Supervision/Verbal cueing Assistive device: Walker-rolling(L hand orthosis)    Walk 10 feet on uneven surface  activity   Assist Walk 10 feet on uneven surfaces activity did not occur: Safety/medical concerns   Assist level: Moderate Assistance - Patient - 50 - 74%     Wheelchair     Assist Will patient use wheelchair at discharge?: No Type of Wheelchair: Manual Wheelchair activity did not occur: Safety/medical concerns  Wheelchair assist level: Minimal Assistance - Patient > 75% Max wheelchair distance: 150    Wheelchair 50 feet with 2 turns activity    Assist    Wheelchair 50 feet with 2 turns activity did not occur: Safety/medical concerns   Assist Level: Minimal Assistance - Patient > 75%   Wheelchair 150 feet activity     Assist Wheelchair 150 feet activity did not occur: Safety/medical concerns   Assist Level: Minimal Assistance - Patient > 75%      Medical Problem List and Plan: 1.Decreased functional mobility with left side weaknesssecondary to brain metastasisdue tonon-small cell lung cancer with right upper lobe lobectomy. Plan SRS radiation therapy per follow-up radiation oncology. Palliative care consult has been obtained -CIR PT, OT,team conf in am 2. DVT Prophylaxis/Anticoagulation: SCD 3. Pain Management:Neurontin 600 mg daily at bedtime,Lodine 400 mg twice a day MSIR 7.5 mg every 4 hours as needed -pain left thigh: no muscular tenderness Neg femur xray prior knee flims and CT neg for lesion, did have Chondrocalcinosis Doppler is neg  -Pain under control 4. Mood:Provide emotional support 5. Neuropsych: This patientiscapable of making  decisions on herown behalf. 6. Skin/Wound Care:Routine skin checks 7. Fluids/Electrolytes/Nutrition:Routine in and out's with follow-up chemistries.  -improving po intake  -continue to push fluids  -Recheck BMET BUN 26-improved 8. Hypertension. Norvasc 10 mg daily, Coreg 25 mg twice a day, Cozaar 50mg  BID. Monitor with increased mobility Vitals:   07/30/18 2043 07/31/18 0528  BP: (!) 176/79 (!) 186/81  Pulse: 83 76  Resp: 16 16  Temp: 97.8 F (36.6 C) 98.4 F (36.9 C)  SpO2: 99% 100%  Borderline control, this has been ongoing issue, discussed addition of diuretic but pt prefers not to rock the boat and instead f/u with cardiology as outpt.   9. Hypothyroidism. Synthroid 10. Hyperlipidemia. Lipitor 12. Dyspnea: Lung exam fairly unremarkable today except for mild wheezing  -Add scheduled Pulmicort nebulizer and as needed albuterol nebulizers   -Check chest x-ray Last ECHO 3491 dr 1 diastolic dysfx, normal Ej fx LOS: 7 days A FACE TO FACE EVALUATION WAS PERFORMED  Charlett Blake 07/31/2018, 8:08 AM

## 2018-07-31 NOTE — Consult Note (Signed)
Neuropsychological Consultation   Patient:   [name]   DOB:   11/08/1940  MR Number:  [id number]  Location:  [facility] [id number] [place] [address] Dept: [place]: [phone]           Date of Service:   07/31/2018  Start Time:   3 PM End Time:   4 PM  Provider/Observer:  Ilean Skill, Psy.D.       Clinical Neuropsychologist       Billing Code/Service: [phone] 4 Units  Chief Complaint:    [name] is a 78 year old female with a history of chronic renal insufficiency stage III, hypertension, hyperlipidemia, non-small cell lung cancer with right upper lobe lobectomy 2009 with recurrence in 2013 that was treated with radiation therapy and followed by oncology services.  The patient presented on 07/21/2018 after recent fall with left arm weakness and motor dyscontrol.  MRI of the brain reviewed showing intra-and extracranial brain masses.  The patient's MRI showed at least 8 metastasis.  The patient was very weak and fatigued and it was recommended that she be referred to the inpatient physical medicine/rehab program due to debility and general weakness.  Reason for Service:  The patient was referred for neuropsychological consultation to assess the degree that coping issues and stressors might play in her ability to fully benefit from the therapeutic interventions.  Below is the HPI for the current admission.  WUX:LKGMWN D [name] is a 78 year old right-handed female with history of chronic renal insufficiency stage III, hypertension, hyperlipidemia, non-small cell lung cancer with right upper lobe lobectomy 2009 recurrence in 2013 that was treated with radiation therapy and followed by oncology services. Per chart review and patient, patient lives alone independent prior to admission for most tasks with assistance of children and grandchildren for other tasks. She does have  family in good support. Presented 07/21/2018 after recent fall with left arm weakness. MRI the brain reviewed, showing intra-and extracranial brain masses. Per report, showed at least 8 parenchymal metastasis, to left periorbital subcutaneous brain lesions with marked surrounding edema likely reflecting subcutaneous metastasis. Oncology services follow-up Dr. Jana Hakim with plan for [facility] radiation therapy which was also discussed with Dr. Julien Nordmann and Dr. Lisbeth Renshaw of radiation oncology. Therapy evaluations completed with recommendations of physical medicine rehabilitation consult. Patient was admitted for a comprehensive rehabilitation program.  Current Status:  The patient reports that her mood is always been quite positive and that she has been coping quite well with all of the issues related to her cancer that she has been having to deal with.  The patient reports that she made it through the initial diagnosis and at that time assume that she was dying.  The patient reports that even with a reoccurrence in 2013 that she handled it quite well.  The patient reports that while she does not want to die that she is comfortable with having to go through and deal with what she is going to have to do with the radiation interventions and the issues potentially related to her own mortality.  The patient reports that overall her cognitive function has remained quite good throughout all this and she denies any significant stressors or coping issues.  Behavioral Observation: MCKENLEY BIRENBAUM  presents as a 78 y.o.-year-old Right Caucasian Female who appeared her stated age. her dress was Appropriate and she was Well Groomed and her manners were Appropriate to the situation.  her  participation was indicative of Appropriate and Attentive behaviors.  There were any physical disabilities noted.  she displayed an appropriate level of cooperation and motivation.     Interactions:    Active Appropriate and  Attentive  Attention:   within normal limits and attention span and concentration were age appropriate  Memory:   within normal limits; recent and remote memory intact  Visuo-spatial:  not examined  Speech (Volume):  normal  Speech:   normal; normal  Thought Process:  Coherent and Relevant  Though Content:  WNL; not suicidal and not homicidal  Orientation:   person, place, time/date and situation  Judgment:   Good  Planning:   Good  Affect:    Appropriate  Mood:    Euthymic  Insight:   Good  Intelligence:   high  Medical History:   Past Medical History:  Diagnosis Date  . Anemia 09/2017  . Arthritis    back.and feet  . Cervical ca ([place]) dx'd 1988   surg only  . CKD (chronic kidney disease), stage III ([place])   . Complication of anesthesia    difficulty awakening, dysrhythmia post surgery, (prolonged sedation level)  . Coronary artery calcification seen on CT scan   . Dyspnea   . GERD (gastroesophageal reflux disease)   . Headache    occ. sinus type headaches  . History of hiatal hernia   . Hx of radiation therapy 09/16/13-09/30/13   prevascular lymph node  . Hyperlipidemia   . Hypertension   . Hyperthyroidism    dr Loanne Drilling- radioactive iodine treatment 2011- resolved  . Hypothyroidism   . LBBB (left bundle branch block)   . Lung cancer ([place])    Bilaterally, RUL lobectomy and spot involving both lungs- "cancer"  . Macular degeneration    bilateral  . Melanoma ([place])    left leg  . Radiation 10/11/11-10/21/11   Left upper lobe adenocarcinoma  . Tremor    hand- intention tremor        Abuse/Trauma History: The patient denies any significant history of abuse/trauma.  Psychiatric History:  The patient denies any prior psychiatric history or issues related to depression or anxiety or difficulties associated with coping with her cancer treatments to the years.  Family Med/Psych History:  Family History  Problem Relation Age of Onset  . Asthma Mother   .  Heart disease Mother   . Thyroid disease Daughter        hypothyroidism  . Cancer Maternal Uncle        lung  . Cancer Maternal Grandfather        lung  . Diabetes Neg Hx   . Coronary artery disease Neg Hx     Risk of Suicide/Violence: virtually non-existent   Impression/DX:  Neisha Hinger is a 78 year old female with a history of chronic renal insufficiency stage III, hypertension, hyperlipidemia, non-small cell lung cancer with right upper lobe lobectomy 2009 with recurrence in 2013 that was treated with radiation therapy and followed by oncology services.  The patient presented on 07/21/2018 after recent fall with left arm weakness and motor dyscontrol.  MRI of the brain reviewed showing intra-and extracranial brain masses.  The patient's MRI showed at least 8 metastasis.  The patient was very weak and fatigued and it was recommended that she be referred to the inpatient physical medicine/rehab program due to debility and general weakness.  The patient reports that her mood is always been quite positive and that she has been coping quite well with all  of the issues related to her cancer that she has been having to deal with.  The patient reports that she made it through the initial diagnosis and at that time assume that she was dying.  The patient reports that even with a reoccurrence in 2013 that she handled it quite well.  The patient reports that while she does not want to die that she is comfortable with having to go through and deal with what she is going to have to do with the radiation interventions and the issues potentially related to her own mortality.  The patient reports that overall her cognitive function has remained quite good throughout all this and she denies any significant stressors or coping issues.   Disposition/Plan:  The patient appears to be doing quite well from an emotional standpoint and coping well with her diagnoses and prognosis.  The patient reports that she has been  actively working on therapeutic interventions and remains highly motivated to not only complete her inpatient care which has produced significant improvements in motor strength and functioning but that she plans on continuing these efforts on an outpatient basis.  Diagnosis:    Brain metastases ([place]) - Plan: dexamethasone (DECADRON) tablet 4 mg, dexamethasone (DECADRON) tablet 4 mg  Left thigh pain - Plan: DG FEMUR MIN 2 VIEWS LEFT, DG FEMUR MIN 2 VIEWS LEFT  Dyspnea - Plan: DG Chest 2 View, DG Chest 2 View         Electronically Signed   _______________________ Ilean Skill, Psy.D.

## 2018-07-31 NOTE — Telephone Encounter (Signed)
Scheduled appt per 1/20 sch message - unable to reach patient - left message with appt date and time  Sent reminder letter in the mail.

## 2018-07-31 NOTE — Progress Notes (Signed)
Social Work Patient ID: [name], female   DOB: 1940/11/05, 78 y.o.   MRN: [id number] Met with pt to inform of her appointment at the [facility] and transport to be here at 12;30 to take over to [facility]. Pt has no preference regarding home health agencies. Have already ordered rolling walker for home. Work toward discharge Thursday.

## 2018-07-31 NOTE — Progress Notes (Signed)
Patient ID: [name], female   DOB: May 27, 1941, 78 y.o.   MRN: [id number]  This NP visited patient at the bedside as a follow up to  yesterday's [place].  Patient is alert and oriented.  Pleasant conversation regarding her progress in rehab.  Left hand control improving.  She anticipates radiation next week and discharge home.  Strong family support.  Ongoing discussed with patient the importance of continued conversation with her  family and the medical providers regarding overall plan of care and treatment options,  ensuring decisions are within the context of the patients values and GOCs.  Questions and concerns addressed   Discussed with Dr Letta Pate  Total time spent on the unit was 15 minutes     Greater than 50% of the time was spent in counseling and coordination of care  Wadie Lessen NP  Palliative Medicine Team Team Phone # [phone] Pager [phone]

## 2018-08-01 ENCOUNTER — Inpatient Hospital Stay (HOSPITAL_COMMUNITY): Payer: Medicare Other

## 2018-08-01 ENCOUNTER — Inpatient Hospital Stay (HOSPITAL_COMMUNITY): Payer: Medicare Other | Admitting: Physical Therapy

## 2018-08-01 ENCOUNTER — Inpatient Hospital Stay (HOSPITAL_COMMUNITY): Payer: Medicare Other | Admitting: Occupational Therapy

## 2018-08-01 ENCOUNTER — Ambulatory Visit
Admission: RE | Admit: 2018-08-01 | Discharge: 2018-08-01 | Disposition: A | Discharge status: Home / Self Care | Payer: Medicare Other | Source: Ambulatory Visit | Attending: Radiation Oncology | Admitting: Radiation Oncology

## 2018-08-01 ENCOUNTER — Encounter: Payer: Self-pay | Admitting: Radiation Oncology

## 2018-08-01 DIAGNOSIS — C7931 Secondary malignant neoplasm of brain: Secondary | ICD-10-CM | POA: Diagnosis not present

## 2018-08-01 DIAGNOSIS — C3412 Malignant neoplasm of upper lobe, left bronchus or lung: Secondary | ICD-10-CM | POA: Diagnosis not present

## 2018-08-01 NOTE — Progress Notes (Signed)
Occupational Therapy Session Note  Patient Details  Name: ANDERSYN FRAGOSO MRN: [id number] Date of Birth: 1940/10/19  Today's Date: 08/01/2018 OT Individual Time: 0804-0900 OT Individual Time Calculation (min): 56 min    Short Term Goals: Week 1:  OT Short Term Goal 1 (Week 1): STG = LTGs due to ELOS  Skilled Therapeutic Interventions/Progress Updates:    Completed ADL retraining at overall supervision level.  Pt ambulated to bathroom with RW with Lt hand orthosis with distant supervision.  Pt able to complete clothing management and hygiene with distant supervision, recalling to utilize LUE as able.  Pt completed bathing at sit > stand level at sink with improved functional use of LUE and standing tolerance.  Pt reports she is not ready to complete bathing at shower level, but has all equipment and will have supervision when she showers.  Engaged in LUE activity with focus on fine and gross motor control, with improvements in functional grasp even 3 jaw chuck to place and remove medium sized pegs.  Pt remained upright in w/c with all needs in reach.  MD arriving.  Therapy Documentation Precautions:  Precautions Precautions: Fall Restrictions Weight Bearing Restrictions: No General:   Vital Signs: Oxygen Therapy SpO2: 97 % O2 Device: Room Air Pain:  Pt with no c/o pain   Therapy/Group: Individual Therapy  Simonne Come 08/01/2018, 10:46 AM

## 2018-08-01 NOTE — Progress Notes (Addendum)
Physical Therapy Discharge Summary  Patient Details  Name: [name] MRN: [id number] Date of Birth: [birth year]  Today's Date: 08/01/2018     Patient has met 9 of 9 long term goals due to improved activity tolerance, improved balance, improved postural control, increased strength, increased range of motion, decreased pain, ability to compensate for deficits, functional use of  left upper extremity, improved attention, improved awareness and improved coordination.  Patient to discharge at an ambulatory level supervision with RW & L hand orthosis.   No family members were present for hands on training prior to pt's d/c.  Reasons goals not met: n/a  Recommendation:  Patient will benefit from ongoing skilled PT services in home health setting to continue to advance safe functional mobility, address ongoing impairments in functional use of LUE, endurance & activity tolerance, standing balance, gait, stair negotiation, and minimize fall risk.  Equipment: RW, L hand orthosis  Reasons for discharge: treatment goals met and discharge from hospital  Patient/family agrees with progress made and goals achieved: Yes  PT Discharge Precautions/Restrictions Precautions Precautions: Fall Precaution Comments: decreased attention to L Restrictions Weight Bearing Restrictions: No   Vision/Perception  Pt wears glasses for reading only & has macular degeneration at baseline. No changes in baseline vision. Perception Perception: Impaired Inattention/Neglect: (decreased attention to LUE)   Cognition Overall Cognitive Status: Within Functional Limits for tasks assessed Arousal/Alertness: Awake/alert Orientation Level: Oriented X4 Memory: Appears intact Awareness: Appears intact Safety/Judgment: Appears intact  Sensation Sensation Light Touch: Impaired Detail Light Touch Impaired Details: Impaired LUE Proprioception: Impaired Detail Proprioception Impaired Details: Impaired  LUE Coordination Gross Motor Movements are Fluid and Coordinated: No(impaired LUE) Fine Motor Movements are Fluid and Coordinated: No(impaired LUE)  Motor  Motor Motor: Abnormal postural alignment and control Motor - Discharge Observations: generalized deconditioning, hemi (LUE>LLE)   Mobility Bed Mobility Bed Mobility: Supine to Sit;Sit to Supine Supine to Sit: Independent with assistive device Sit to Supine: Independent with assistive device Transfers Transfers: Sit to Stand;Stand to Sit Sit to Stand: Supervision/Verbal cueing Stand to Sit: Supervision/Verbal cueing  Locomotion  Gait Ambulation: Yes Gait Assistance: Supervision/Verbal cueing Gait Distance (Feet): 150 Feet Assistive device: Rolling walker(L hand orthosis) Gait Gait: Yes Gait Pattern: (decreased gait speed) Stairs / Additional Locomotion Stairs: Yes Stairs Assistance: Supervision/Verbal cueing Stair Management Technique: Two rails Number of Stairs: 4 Height of Stairs: 6(inches) Ramp: Supervision/Verbal cueing(ambulatory with RW) Curb: Contact Guard/Touching assist(with RW & rail) Wheelchair Mobility Wheelchair Mobility: No   Trunk/Postural Assessment  Postural Control Postural Control: Deficits on evaluation Righting Reactions: delayed Protective Responses: delayed   Balance Balance Balance Assessed: Yes Standardized Balance Assessment Standardized Balance Assessment: Berg Balance Test Berg Balance Test completed on 07/28/2018 Sit to Stand: Able to stand without using hands and stabilize independently Standing Unsupported: Able to stand 2 minutes with supervision Sitting with Back Unsupported but Feet Supported on Floor or Stool: Able to sit safely and securely 2 minutes Stand to Sit: Sits safely with minimal use of hands Transfers: Able to transfer with verbal cueing and /or supervision Standing Unsupported with Eyes Closed: Able to stand 10 seconds with supervision Standing Ubsupported with  Feet Together: Able to place feet together independently and stand for 1 minute with supervision From Standing, Reach Forward with Outstretched Arm: Can reach forward >12 cm safely (5") From Standing Position, Pick up Object from Floor: Able to pick up shoe, needs supervision From Standing Position, Turn to Look Behind Over each Shoulder: Turn sideways only but maintains balance Turn  360 Degrees: Needs close supervision or verbal cueing Standing Unsupported, Alternately Place Feet on Step/Stool: Able to complete >2 steps/needs minimal assist Standing Unsupported, One Foot in Front: Able to take small step independently and hold 30 seconds Standing on One Leg: Unable to try or needs assist to prevent fall Total Score: 35   Extremity Assessment  RUE Assessment RUE Assessment: Within Functional Limits LUE Assessment LUE Assessment: Exceptions to [facility] Passive Range of Motion (PROM) Comments: WNL Impaired grasp & oppositional movements.    RLE Assessment RLE Assessment: Within Functional Limits LLE Assessment LLE Assessment: Within Functional Limits    Waunita Schooner 08/01/2018, 12:08 PM

## 2018-08-01 NOTE — Discharge Summary (Signed)
Discharge summary job [id number]

## 2018-08-01 NOTE — Progress Notes (Signed)
Social Work Patient ID: [name], female   DOB: [birth year], 79 y.o.   MRN: [id number] Met with pt before leaving for the cancer center to inform of team conference. She feels ready to go home tomorrow and has her caregivers in place. Has equipment and home health arranged. See in am for any last minute questions.

## 2018-08-01 NOTE — Progress Notes (Signed)
Patient ID: KANSAS SPAINHOWER, female   DOB: Jun 13, 1941, 78 y.o.   MRN: [id number]  Name: [name]  MRN: [id number]  Date: 07/24/2018   DOB: 1940-10-08  Stereotactic Radiosurgery Operative Note  PRE-OPERATIVE DIAGNOSIS:  Multiple Brain Metastases  POST-OPERATIVE DIAGNOSIS:  Multiple Brain Metastases  PROCEDURE:  Stereotactic Radiosurgery  SURGEON:  Eustace Moore, MD  NARRATIVE: The patient underwent a radiation treatment planning session in the radiation oncology simulation suite under the care of the radiation oncology physician and physicist.  I participated closely in the radiation treatment planning afterwards. The patient underwent planning CT which was fused to 3T high resolution MRI with 1 mm axial slices.  These images were fused on the planning system.  We contoured the gross target volumes and subsequently expanded this to yield the Planning Target Volume. I actively participated in the planning process.  I helped to define and review the target contours and also the contours of the optic pathway, eyes, brainstem and selected nearby organs at risk.  All the dose constraints for critical structures were reviewed and compared to AAPM Task Group 101.  The prescription dose conformity was reviewed.  I approved the plan electronically.    Accordingly, [name] was brought to the TrueBeam stereotactic radiation treatment linac and placed in the custom immobilization mask.  The patient was aligned according to the IR fiducial markers with BrainLab Exactrac, then orthogonal x-rays were used in ExacTrac with the 6DOF robotic table and the shifts were made to align the patient  [name] received stereotactic radiosurgery uneventfully.    Lesions treated:  10   Complex lesions treated:  1 (>3.5 cm, <49mm of optic path, or within the brainstem)   The detailed description of the procedure is recorded in the radiation oncology procedure note.  I was present for the duration of the  procedure.  DISPOSITION:  Following delivery, the patient was transported to nursing in stable condition and monitored for possible acute effects to be discharged to home in stable condition with follow-up in one month.  Eustace Moore, MD 08/01/2018 1:38 PM

## 2018-08-01 NOTE — Plan of Care (Signed)
  Problem: Consults Goal: RH BRAIN INJURY PATIENT EDUCATION Description Description: See Patient Education module for eduction specifics Outcome: Progressing   Problem: RH BOWEL ELIMINATION Goal: RH STG MANAGE BOWEL WITH ASSISTANCE Description STG Manage Bowel with mod I Assistance.  Outcome: Progressing   Problem: RH BLADDER ELIMINATION Goal: RH STG MANAGE BLADDER WITH ASSISTANCE Description STG Manage Bladder With mod I Assistance  Outcome: Progressing   Problem: RH SAFETY Goal: RH STG ADHERE TO SAFETY PRECAUTIONS W/ASSISTANCE/DEVICE Description STG Adhere to Safety Precautions With mod I Assistance/Device.  Outcome: Progressing   Problem: RH PAIN MANAGEMENT Goal: RH STG PAIN MANAGED AT OR BELOW PT'S PAIN GOAL Description < 4  Outcome: Progressing   Problem: RH KNOWLEDGE DEFICIT BRAIN INJURY Goal: RH STG INCREASE KNOWLEDGE OF SELF CARE AFTER BRAIN INJURY Description Patient/family will verbalize/demonstrate understanding of safe self care strategies as outlined by rehab staff with mod I assist.  Outcome: Progressing

## 2018-08-01 NOTE — Patient Care Conference (Signed)
Inpatient RehabilitationTeam Conference and Plan of Care Update Date: 08/01/2018   Time: 10:55 AM    Patient Name: [name]      Medical Record Number: [id number]  Date of Birth: Jul 21, 1940 Sex: Female         Room/Bed: 4W20C/4W20C-01 Payor Info: Payor: MEDICARE / Plan: MEDICARE PART A AND B / Product Type: *No Product type* /    Admitting Diagnosis: Debility  Admit Date/Time:  07/24/2018  5:39 PM Admission Comments: No comment available   Primary Diagnosis:  <principal problem not specified> Principal Problem: <principal problem not specified>  Patient Active Problem List   Diagnosis Date Noted  . Generalized weakness   . Palliative care by specialist   . DNR (do not resuscitate)   . Brain metastases ([place]) 07/24/2018  . Dyslipidemia   . Malignant neoplasm of right lung ([place])   . Leukocytosis   . Brain metastasis ([place]) 07/21/2018  . Left hemiparesis ([place]) 07/21/2018  . Lung cancer metastatic to brain ([place]) 07/21/2018  . Acute upper GI bleed   . Anemia due to acute blood loss   . Closed flail chest   . CAD (coronary artery disease) 09/21/2017  . Coronary stent thrombosis   . Endotracheal tube present   . CAD in native artery 09/20/2017  . Elevated serum creatinine 04/15/2016  . Acute kidney injury superimposed on chronic kidney disease ([place]) 03/24/2016  . Coronary artery calcification seen on CT scan   . CKD (chronic kidney disease), stage III ([place])   . Hypertension   . Hypothyroidism 03/15/2016  . Hyperlipidemia 08/19/2015  . Cancer of upper lobe of left lung ([place])   . Recurrent lung adenocarcinoma ([place]) 10/01/2014  . Local recurrence of left lung cancer ([place])   . Carotid artery disease without cerebral infarction ([place]) 05/20/2013  . Atherosclerosis of aorta ([place]) 05/10/2013  . Macular degeneration 05/10/2013  . Allergic rhinitis 10/20/2012  . Cancer of upper lobe of lung ([place]) 09/28/2011  . Melanoma ([place])   . LBBB (left bundle branch block)   . Gastritis due  to nonsteroidal anti-inflammatory drug 06/20/2011  . Hyperthyroidism   . Tremor   . Abdominal pain, RUQ 02/10/2011  . GERD with stricture 02/08/2011  . GOITER, MULTINODULAR 09/25/2009  . URI 09/02/2009  . Essential hypertension, benign 12/01/2008    Expected Discharge Date: Expected Discharge Date: 08/02/18  Team Members Present: Physician leading conference: Dr. Alysia Penna Social Worker Present: Ovidio Kin, LCSW Nurse Present: Ellison Carwin, LPN PT Present: Lavone Nian, PT OT Present: Simonne Come, OT SLP Present: Stormy Fabian, SLP     Current Status/Progress Goal Weekly Team Focus  Medical   Remittent shortness of breath likely secondary to radiation fibrosis  Maintain medical stability reduce fall risk increased left upper extremity functioning  Initiating stereotactic radiation therapy for brain metastases   Bowel/Bladder   Continent of bowel and bladder LBM 07/29/18   remain continent  Assess bowel and bladder need qshit and PRN   Swallow/Nutrition/ Hydration             ADL's   supervision bathing, UB dressing, toilet transfers and toileting, CGA for LB dressing  Supervision overall  ADL retraining, dynamic standing balance, LUE NMR, activity tolerance/endurance, d/c planning   Mobility   close supervision overall with RW, stairs with B rails  supervision overall except CGA for stair negotiation with B rails for home access  balance, transfers, gait, stair negotiation, pt education, d/c planning, endurance training   Communication  Safety/Cognition/ Behavioral Observations            Pain   On scheduled tylenol and gabapentin, denies pain.  pain 2 or less  assess pain management qshift and PRN   Skin   Bruising to nose and edema to left eye, mole removal from nose and right leg  no new areas of skin break down  assess skin qshift and PRN      *See Care Plan and progress notes for long and short-term goals.     Barriers to Discharge   Current Status/Progress Possible Resolutions Date Resolved   Physician    Decreased caregiver support;Medical stability;Pending chemo/radiation     Progressing towards goals  Coordinate treatments with radiation oncology, rehabilitation follow-up after discharge      Nursing                  PT  Pending chemo/radiation;Behavior;Medical stability  impaired problem solving/high level cognitive deficits,               OT                  SLP                SW                Discharge Planning/Teaching Needs:  Pt reaching goals-home instead here yesterday and set up for discharge. Pt will have 24 hr supervision at home. Rad Tx appointment [facility] at [facility]      Team Discussion:  Has reached her goals for discharge of supervision level. Intermittent SOB due to rad tx from lung cancer. Chest x-ray and no pneumonia. Balance and endurance issues have improved. Berg 35/56, still at high risk to fall at home in not assisted. Appointment at [facility] today for rad tx.  Revisions to Treatment Plan:  DC 1/23    Continued Need for Acute Rehabilitation Level of Care: The patient requires daily medical management by a physician with specialized training in physical medicine and rehabilitation for the following conditions: Daily direction of a multidisciplinary physical rehabilitation program to ensure safe treatment while eliciting the highest outcome that is of practical value to the patient.: Yes Daily medical management of patient stability for increased activity during participation in an intensive rehabilitation regime.: Yes Daily analysis of laboratory values and/or radiology reports with any subsequent need for medication adjustment of medical intervention for : Neurological problems;Post surgical problems   I attest that I was present, lead the team conference, and concur with the assessment and plan of the team.   Elease Hashimoto 08/01/2018, 1:04 PM

## 2018-08-01 NOTE — Discharge Summary (Signed)
[name], [name] MEDICAL RECORD TO:[id number] ACCOUNT [id number] DATE OF BIRTH:January 06, 1941 FACILITY: [facility] LOCATION: [facility] PHYSICIAN:Knoah Nedeau, MD  DISCHARGE SUMMARY  DATE OF DISCHARGE:  08/02/2018  DISCHARGE DIAGNOSES: 1.  Left-sided weakness secondary to brain metastasis due to nonsmall cell lung cancer with upper lobe lobectomy. 2.  Sequential compression devices for deep venous thrombosis prophylaxis. 3.  Pain management. 4.  Hypertension. 5.  Hypothyroidism. 6.  Hyperlipidemia.  HISTORY OF PRESENT ILLNESS:  This is a 78 year old right-handed female with history of chronic renal insufficiency stage III, hypertension, nonsmall cell lung cancer with upper lobe lobectomy 2009 recurrent 2013.  It was treated with radiation therapy  followed by oncology services.  Lives alone, independent prior to admission with assistance of children and grandchildren.  Presented 07/21/2018 after recent fall with left arm weakness.  MRI of the brain showed intra and extracranial brain masses.  Per  report, at least 8 parenchymal metastasis to left periorbital subcutaneous brain lesions with marked surrounding edema likely reflecting subcutaneous metastasis.  Oncology service followup.  Plan for [facility] radiation therapy, which was discussed with Dr.  Earlie [name] and Dr. Lisbeth [name] radiation oncology.  Therapies initiated and patient was admitted for comprehensive rehabilitation program.  PAST MEDICAL HISTORY:  See discharge diagnoses.  SOCIAL HISTORY:  Lives alone with assistance of family.  FUNCTIONAL STATUS:  Upon admission to rehab services was max assist, stand pivot transfers, moderate assist 40 feet rolling walker, mod/max assist with activities of daily living.  PHYSICAL EXAMINATION: VITAL SIGNS:  Blood pressure 156/80, pulse 77, temperature 97, respirations 20. GENERAL:  Alert female in no acute distress.  Makes good eye contact, follows commands. HEENT:  EOMs intact. NECK:  Supple,  nontender, no JVD. CARDIOVASCULAR:  Rate controlled. ABDOMEN:  Soft, nontender, good bowel sounds. LUNGS:  Clear to auscultation without wheeze.  REHABILITATION HOSPITAL COURSE:  The patient was admitted to inpatient rehabilitation services.  Therapies initiated on a 3-hour daily basis, consisting of physical therapy, occupational therapy and rehabilitation nursing.    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  palliative care consulted and was continuing  to follow.    Pain management with the use of Neurontin 600 mg at bedtime, Decadron taper as advised, MSIR as needed.  SCDs for DVT prophylaxis.    Blood pressure is controlled with present regimen.  No orthostatic changes.  She continued on hormone supplement for hypothyroidism.  Lipitor for hyperlipidemia.  The patient received weekly collaborative interdisciplinary team conferences to discuss estimated length of stay, family teaching, any barriers to her discharge.  Sit to stand supervision.  Ambulates to the stairs, rolling walker.  Negotiates rolling  walker to stairs.  Requires some verbal cues up and down stairs contact guard assist.  Ambulates to the rehabilitation gym with supervision.  Gathers belongings for activities of daily living and homemaking.  Full teaching completed and planned discharge  to home.  DISCHARGE MEDICATIONS:  Included Norvasc 10 mg p.o. daily, Lipitor 80 mg p.o. at bedtime, vitamin D 2 tablets at breakfast Coreg 25 mg p.o. b.i.d., Decadron taper as directed loading 400 mg p.o. b.i.d., Neurontin 600 mg p.o. at bedtime, Synthroid 88 mcg  p.o. daily, Cozaar 50 mg p.o. twice daily, multivitamin daily, Protonix 40 mg p.o. b.i.d., MSIR 7.5 mg p.o. every 4 hours as needed for pain.  DIET:  Regular.  FOLLOWUP:  The patient would  follow up with Dr. Alysia [name] at the outpatient rehab center as directed; Dr. Ottie [name] call for appointment;  Dr. Curt [name], call for appointment; Dr. Kyung [name], radiation oncology, Dr. Lysle [name], medical  management.  AN/NUANCE D:08/01/2018 T:08/01/2018 JOB:[id number]

## 2018-08-01 NOTE — Progress Notes (Signed)
  Radiation Oncology         [phone]) [phone] ________________________________  Name: [name] MRN: [id number]  Date: 08/01/2018  DOB: 1941/07/02   SPECIAL TREATMENT PROCEDURE   3D TREATMENT PLANNING AND DOSIMETRY: The patient's radiation plan was reviewed and approved by Dr. Ronnald Ramp from neurosurgery and radiation oncology prior to treatment. It showed 3-dimensional radiation distributions overlaid onto the planning CT/MRI image set. The [facility] for the target structures as well as the organs at risk were reviewed. The documentation of the 3D plan and dosimetry are filed in the radiation oncology EMR.   NARRATIVE: The patient was brought to the TrueBeam stereotactic radiation treatment machine and placed supine on the CT couch. The head frame was applied, and the patient was set up for stereotactic radiosurgery. Neurosurgery was present for the set-up and delivery   SIMULATION VERIFICATION: In the couch zero-angle position, the patient underwent Exactrac imaging using the Brainlab system with orthogonal KV images. These were carefully aligned and repeated to confirm treatment position for each of the isocenters. The Exactrac snap film verification was repeated at each couch angle.   SPECIAL TREATMENT PROCEDURE: The patient received stereotactic radiosurgery to the following target:  PTV 1-10 targets were treated using 7 Arcs to a prescription dose of 20 Gy (PTVs 1-3, 5-7, 9-10), 14 Gy to PTV4, 18 Gy to PTV8 . ExacTrac Snap verification was performed for each couch angle.   STEREOTACTIC TREATMENT MANAGEMENT: Following delivery, the patient was transported to nursing in stable condition and monitored for possible acute effects. Vital signs were recorded . The patient tolerated treatment without significant acute effects, and was discharged to home in stable condition.  PLAN: Follow-up in one month.   ------------------------------------------------  Jodelle Gross, MD, PhD

## 2018-08-01 NOTE — Progress Notes (Signed)
Occupational Therapy Session Note  Patient Details  Name: [name] MRN: [id number] Date of Birth: Jun 11, 1941  Today's Date: 08/01/2018 OT Individual Time: 1545-1630 OT Individual Time Calculation (min): 45 min    Short Term Goals: Week 1:  OT Short Term Goal 1 (Week 1): STG = LTGs due to ELOS  Skilled Therapeutic Interventions/Progress Updates:    1:1. No c/o pain. Pt received in bed and declines OOB therapy however has questions about various household tasks and adaptations to equipment to make things easier. OT educates pt on use of RW tray to allow pt to transport objects to/from kitchen or laundry room. OT and pt discuss energy conservation techniques and OT provides handout with specific ideas to adapt, dressing, hygiene, cooking, housework and shopping. Pt grateful for various ideas. Exited session with pt seated in bed, call light in reach and all eneds met  Therapy Documentation Precautions:  Precautions Precautions: Fall Precaution Comments: decreased attention to L Restrictions Weight Bearing Restrictions: No General:   Vital Signs: Therapy Vitals Temp: 98.2 F (36.8 C) Pulse Rate: 81 Resp: 17 BP: (!) 151/74 Patient Position (if appropriate): Lying Oxygen Therapy SpO2: 99 % O2 Device: Room Air Pain: Pain Assessment Pain Scale: 0-10 Pain Score: 0-No pain ADL: ADL Eating: Modified independent Where Assessed-Eating: Chair Grooming: Supervision/safety Where Assessed-Grooming: Standing at sink Upper Body Bathing: Supervision/safety Where Assessed-Upper Body Bathing: Standing at sink, Sitting at sink Lower Body Bathing: Supervision/safety Where Assessed-Lower Body Bathing: Standing at sink, Sitting at sink Upper Body Dressing: Setup Where Assessed-Upper Body Dressing: Sitting at sink Lower Body Dressing: Supervision/safety Where Assessed-Lower Body Dressing: Standing at sink, Sitting at sink Toileting: Supervision/safety Where Assessed-Toileting:  Glass blower/designer: Distant supervision Armed forces technical officer Method: Counselling psychologist: Energy manager: Close supervision Social research officer, government Method: Ambulating Vision Baseline Vision/History: Wears glasses;Macular Degeneration Wears Glasses: Reading only Patient Visual Report: No change from baseline Vision Assessment?: No apparent visual deficits Perception  Perception: Impaired Inattention/Neglect: (decreased attention to LUE) Praxis   Exercises:   Other Treatments:     Therapy/Group: Individual Therapy  Tonny Branch 08/01/2018, 4:36 PM

## 2018-08-01 NOTE — Progress Notes (Addendum)
Occupational Therapy Discharge Summary  Patient Details  Name: [name] MRN: [id number] Date of Birth: 24-Feb-1941  Patient has met 10 of 10 long term goals due to improved activity tolerance, improved balance, postural control, ability to compensate for deficits, functional use of  LEFT upper extremity, improved awareness and improved coordination.  Patient to discharge at overall Supervision level with self-care tasks and homemaking tasks.  Patient's care partner is independent to provide the necessary supervision assistance at discharge.  Patient is to have family and hired caregivers to provide recommended supervision at home.  Reasons goals not met: N/A  Recommendation:  Patient will benefit from ongoing skilled OT services in home health setting to continue to advance functional skills in the area of BADL and Reduce care partner burden.  Equipment: No equipment provided.  Has all from previous hospitalizations and husband.  Reasons for discharge: treatment goals met and discharge from hospital  Patient/family agrees with progress made and goals achieved: Yes  OT Discharge Precautions/Restrictions  Precautions Precautions: Fall Restrictions Weight Bearing Restrictions: No General   Vital Signs Therapy Vitals Temp: 98.2 F (36.8 C) Pulse Rate: 81 Resp: 17 BP: (!) 151/74 Patient Position (if appropriate): Lying Oxygen Therapy SpO2: 99 % O2 Device: Room Air Pain Pain Assessment Pain Scale: 0-10 Pain Score: 0-No pain ADL ADL Eating: Modified independent Where Assessed-Eating: Chair Grooming: Supervision/safety Where Assessed-Grooming: Standing at sink Upper Body Bathing: Supervision/safety Where Assessed-Upper Body Bathing: Standing at sink, Sitting at sink Lower Body Bathing: Supervision/safety Where Assessed-Lower Body Bathing: Standing at sink, Sitting at sink Upper Body Dressing: Setup Where Assessed-Upper Body Dressing: Sitting at sink Lower Body  Dressing: Supervision/safety Where Assessed-Lower Body Dressing: Standing at sink, Sitting at sink Toileting: Supervision/safety Where Assessed-Toileting: Glass blower/designer: Distant supervision Armed forces technical officer Method: Counselling psychologist: Energy manager: Close supervision Social research officer, government Method: Ambulating Vision Baseline Vision/History: Wears glasses;Macular Degeneration Wears Glasses: Reading only Patient Visual Report: No change from baseline Vision Assessment?: No apparent visual deficits Perception  Perception: Impaired Inattention/Neglect: (decreased attention to LUE) Praxis   Cognition Overall Cognitive Status: Within Functional Limits for tasks assessed Arousal/Alertness: Awake/alert Orientation Level: Oriented X4 Memory: Appears intact Awareness: Appears intact Problem Solving: Appears intact Safety/Judgment: Appears intact Sensation Sensation Light Touch: Impaired Detail Light Touch Impaired Details: Impaired LUE Proprioception: Impaired Detail Proprioception Impaired Details: Impaired LUE Coordination Gross Motor Movements are Fluid and Coordinated: No Fine Motor Movements are Fluid and Coordinated: No Coordination and Movement Description: impaired LUE Finger Nose Finger Test: impaired on Lt, mild dysmetria, improved from eval Motor    Mobility     Trunk/Postural Assessment     Balance   Extremity/Trunk Assessment RUE Assessment RUE Assessment: Within Functional Limits LUE Assessment LUE Assessment: Exceptions to [facility] Passive Range of Motion (PROM) Comments: WNL Active Range of Motion (AROM) Comments: shoulder flexion to 120*, full elbow flexion/extension, and loose grasp General Strength Comments: Strength grossly 3 to 3+/5 overall, improved functional grasp   Adasha Boehme, [facility] 08/01/2018, 4:05 PM

## 2018-08-01 NOTE — Progress Notes (Signed)
Physical Therapy Session Note  Patient Details  Name: [name] MRN: [id number] Date of Birth: [birth year]  Today's Date: 08/01/2018 PT Individual Time: 1106-1202 PT Individual Time Calculation (min): 56 min   Short Term Goals: Week 1:  PT Short Term Goal 1 (Week 1): Pt will ambulate 75 ft with LRAD & min assist. PT Short Term Goal 2 (Week 1): Pt will negotiate 8 steps with 1 rail and min assist.  PT Short Term Goal 3 (Week 1): Pt will complete all transfers with CGA.  Skilled Therapeutic Interventions/Progress Updates:   Pt received in bed and agreeable to tx. Pt transfers sit<>stand, ambulates to bathroom with RW and L orthosis, and performs toilet transfer with distant supervision. Pt continent BM and void and performs self-hygiene independently. Pt ambulates around unit with RW and L hand orthosis with distant supervision. Pt performs car transfer to mid-size SUV simulated vehicle with distant supervision and some verbal cuing for hand placement. Pt ambulates up ramp with RW and L hand orthosis with distant supervision and walks on uneven surface with CGA and improved L hip and knee flexion during ambulation on uneven surface compared to evaluation day. Pt performs bed mobility mod independently and sit<>stand in recliner with supervision.  Pt steps on/off of curb with RW and CGA with verbal cuing for RW management. Therapist educates pt on use of RW to negotiate platform step and need to hold rails at single step as per home set up with assistance from caregiver for assistance with RW. Pt ascends/descends 4 stairs with 2 handrails and supervision with step-to pattern. Pt picks up object off of floor with CGA to maintain balance. Pt left lying in bed with bed alarm on and all needs in reach.   No c/o pain during session but c/o SOB after walking and rest breaks provided.   Therapy Documentation Precautions:  Precautions Precautions: Fall Precaution Comments: decreased attention to  L Restrictions Weight Bearing Restrictions: No   Therapy/Group: Individual Therapy  Sora Vrooman 08/01/2018, 12:07 PM

## 2018-08-01 NOTE — Progress Notes (Signed)
[place] PHYSICAL MEDICINE & REHABILITATION PROGRESS NOTE   Subjective/Complaints:  Still has episodes of SOB, mild at the end of therapy session or talking for a long time.  GIves hx of multiple Rad tx treatment of LUL lesion, was told she had some "radiation damage to lung"  No cough or fever no CP   ROS: Patient denies fever, , nausea, vomiting, diarrhea, cough,   chest pain,   Objective:   Dg Chest 2 View  Result Date: 07/30/2018 CLINICAL DATA:  Recent fall with shortness of Breath EXAM: CHEST - 2 VIEW COMPARISON:  06/18/2018 FINDINGS: Cardiac shadow is stable. Aortic calcifications are again seen. Right lung is clear. Persistent left upper lobe mass is noted stable from the prior exam. Postsurgical changes in the right hilum are again noted. No effusions are seen. Old rib fractures on the right are noted with healing. IMPRESSION: Stable left upper lobe mass lesion. Electronically Signed   By: Inez Catalina M.D.   On: 07/30/2018 14:52   No results for input(s): WBC, HGB, HCT, PLT in the last 72 hours. Recent Labs    07/30/18 0638  NA 138  K 4.4  CL 104  CO2 26  GLUCOSE 128*  BUN 26*  CREATININE 0.93  CALCIUM 8.9    Intake/Output Summary (Last 24 hours) at 08/01/2018 0908 Last data filed at 07/31/2018 1829 Gross per 24 hour  Intake 462 ml  Output -  Net 462 ml     Physical Exam: Vital Signs Blood pressure (!) 128/101, pulse 81, temperature 98.1 F (36.7 C), temperature source Oral, resp. rate 16, height '5\' 3"'  (1.6 m), weight 60.1 kg, SpO2 97 %.   Constitutional: No distress . Vital signs reviewed. HEENT: EOMI, oral membranes moist Neck: supple Cardiovascular: RRR without murmur. No JVD    Respiratory: CTA Bilaterally with occasional wheeze, no rales.  Slight dyspnea while speaking GI: BS +, non-tender, non-distended   Ext: no edema Skin: No evidence of breakdown, no evidence of rash Neurologic: Cranial nerves II through XII intact, motor strength is 5/5 in RIght   deltoid, bicep, tricep, grip, hip flexor, knee extensors, ankle dorsiflexor and plantar flexor, 4/5 on left side--motor and sensory exam stable Able to place PEGs and remove Ext - neg C/C/E Musculoskeletal: Full range of motion in all 4 extremities. No joint swelling   Assessment/Plan: 1. 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  Physiatrist is providing close team supervision and 24 hour management of active medical problems listed below.  Physiatrist and rehab team continue to assess barriers to discharge/monitor patient progress toward functional and medical goals  Care Tool:  Bathing    Body parts bathed by patient: Right arm, Left arm, Abdomen, Chest, Front perineal area, Buttocks, Right upper leg, Left upper leg, Right lower leg, Left lower leg, Face   Body parts bathed by helper: Right lower leg, Left lower leg, Right arm     Bathing assist Assist Level: Supervision/Verbal cueing     Upper Body Dressing/Undressing Upper body dressing   What is the patient wearing?: Pull over shirt    Upper body assist Assist Level: Set up assist    Lower Body Dressing/Undressing Lower body dressing      What is the patient wearing?: Pants     Lower body assist Assist for lower body dressing: Contact Guard/Touching assist  Toileting Toileting    Toileting assist Assist for toileting: Supervision/Verbal cueing     Transfers Chair/bed transfer  Transfers assist     Chair/bed transfer assist level: Supervision/Verbal cueing     Locomotion Ambulation   Ambulation assist      Assist level: Supervision/Verbal cueing Assistive device: Walker-rolling(RW w/ L hand orthosis) Max distance: 150 ft   Walk 10 feet activity   Assist     Assist level: Supervision/Verbal cueing Assistive device: Walker-rolling,  Orthosis(RW w/ L hand orthosis)   Walk 50 feet activity   Assist    Assist level: Supervision/Verbal cueing Assistive device: Walker-rolling, Orthosis    Walk 150 feet activity   Assist Walk 150 feet activity did not occur: Safety/medical concerns  Assist level: Supervision/Verbal cueing Assistive device: Walker-rolling, Orthosis    Walk 10 feet on uneven surface  activity   Assist Walk 10 feet on uneven surfaces activity did not occur: Safety/medical concerns   Assist level: Moderate Assistance - Patient - 50 - 74%     Wheelchair     Assist Will patient use wheelchair at discharge?: No Type of Wheelchair: Manual Wheelchair activity did not occur: Safety/medical concerns  Wheelchair assist level: Minimal Assistance - Patient > 75% Max wheelchair distance: 150    Wheelchair 50 feet with 2 turns activity    Assist    Wheelchair 50 feet with 2 turns activity did not occur: Safety/medical concerns   Assist Level: Minimal Assistance - Patient > 75%   Wheelchair 150 feet activity     Assist Wheelchair 150 feet activity did not occur: Safety/medical concerns   Assist Level: Minimal Assistance - Patient > 75%      Medical Problem List and Plan: 1.Decreased functional mobility with left side weaknesssecondary to brain metastasisdue tonon-small cell lung cancer with right upper lobe lobectomy. Plan SRS radiation therapy per follow-up radiation oncology. Palliative care consult has been obtained -CIR PT, OT,Team conference today please see physician documentation under team conference tab, met with team face-to-face to discuss problems,progress, and goals. Formulized individual treatment plan based on medical history, underlying problem and comorbidities. 2. DVT Prophylaxis/Anticoagulation: SCD 3. Pain Management:Neurontin 600 mg daily at bedtime,Lodine 400 mg twice a day MSIR 7.5 mg every 4 hours as needed -pain left  thigh: no muscular tenderness Neg femur xray prior knee flims and CT neg for lesion, did have Chondrocalcinosis Doppler is neg  -Pain under control 4. Mood:Provide emotional support 5. Neuropsych: This patientiscapable of making decisions on herown behalf. 6. Skin/Wound Care:Routine skin checks 7. Fluids/Electrolytes/Nutrition:Routine in and out's with follow-up chemistries.  -improving po intake  -continue to push fluids  -Recheck BMET BUN 26-improved 8. Hypertension. Norvasc 10 mg daily, Coreg 25 mg twice a day, Cozaar 38m BID. Monitor with increased mobility Vitals:   08/01/18 0400 08/01/18 0742  BP: (!) 128/101   Pulse: 81   Resp: 16   Temp: 98.1 F (36.7 C)   SpO2: 940%981% systolic controlled this am but elevated diatstolic no med changes   9. Hypothyroidism. Synthroid 10. Hyperlipidemia. Lipitor 12. Dyspnea: Lung exam fairly unremarkable today except for mild wheezing  -Add scheduled Pulmicort nebulizer and as needed albuterol nebulizers   -Check chest x-ray Last ECHO 24481dr 1 diastolic dysfx, normal Ej fx LOS: 8 days A FACE TO FACE EVALUATION WAS PERFORMED  ACharlett Blake1/22/2020, 9:08 AM

## 2018-08-02 MED ORDER — MORPHINE SULFATE 15 MG PO TABS: 7.5000 mg | ORAL_TABLET | ORAL | 0 refills | Status: AC | PRN

## 2018-08-02 MED ORDER — CALCIUM CARBONATE-VITAMIN D3 600-400 MG-UNIT PO TABS: 1.0000 | ORAL_TABLET | ORAL | 0 refills | Status: AC

## 2018-08-02 MED ORDER — CARVEDILOL 25 MG PO TABS: 25.0000 mg | ORAL_TABLET | Freq: Two times a day (BID) | ORAL | 11 refills | Status: AC

## 2018-08-02 MED ORDER — LOSARTAN POTASSIUM 50 MG PO TABS: 50.0000 mg | ORAL_TABLET | Freq: Two times a day (BID) | ORAL | 3 refills | Status: AC

## 2018-08-02 MED ORDER — GABAPENTIN 300 MG PO CAPS: 600.0000 mg | ORAL_CAPSULE | Freq: Every day | ORAL | 2 refills | Status: AC

## 2018-08-02 MED ORDER — ETODOLAC 400 MG PO TABS: 400.0000 mg | ORAL_TABLET | Freq: Two times a day (BID) | ORAL | 3 refills | Status: AC

## 2018-08-02 MED ORDER — PANTOPRAZOLE SODIUM 40 MG PO TBEC: 40.0000 mg | DELAYED_RELEASE_TABLET | Freq: Two times a day (BID) | ORAL | 0 refills | Status: AC

## 2018-08-02 MED ORDER — ACETAMINOPHEN 325 MG PO TABS: 650.0000 mg | ORAL_TABLET | Freq: Four times a day (QID) | ORAL | Status: AC

## 2018-08-02 MED ORDER — AMLODIPINE BESYLATE 10 MG PO TABS: 10.0000 mg | ORAL_TABLET | Freq: Every day | ORAL | 11 refills | Status: AC

## 2018-08-02 MED ORDER — LEVOTHYROXINE SODIUM 88 MCG PO TABS: 88.0000 ug | ORAL_TABLET | Freq: Every day | ORAL | 1 refills | Status: AC

## 2018-08-02 MED ORDER — DEXAMETHASONE 4 MG PO TABS: 4.0000 mg | ORAL_TABLET | Freq: Three times a day (TID) | ORAL | 0 refills | Status: AC

## 2018-08-02 MED ORDER — ATORVASTATIN CALCIUM 80 MG PO TABS: 80.0000 mg | ORAL_TABLET | Freq: Every day | ORAL | 3 refills | Status: AC

## 2018-08-02 NOTE — Discharge Instructions (Signed)
Inpatient Rehab Discharge Instructions  [name] Discharge date and time: No discharge date for patient encounter.   Activities/Precautions/ Functional Status: Activity: activity as tolerated Diet: regular diet Wound Care: none needed Functional status:  ___ No restrictions     ___ Walk up steps independently ___ 24/7 supervision/assistance   ___ Walk up steps with assistance ___ Intermittent supervision/assistance  ___ Bathe/dress independently ___ Walk with walker     _x__ Bathe/dress with assistance ___ Walk Independently    ___ Shower independently ___ Walk with assistance    ___ Shower with assistance ___ No alcohol     ___ Return to work/school ________  Special Instructions:  No driving  Continue Decadron as directed per oncology services    COMMUNITY REFERRALS UPON DISCHARGE:    Home Health:   [facility] Phone :[phone] Date of last service:08/02/2018  Medical Equipment/Items [place] PATIENT/FAMILY: Support Groups:CANCER SUPPORT GROUP-[facility] Thursday OF THE MONTH @ 4:00-5:30 PM AT [facility] [phone]   My questions have been answered and I understand these instructions. I will adhere to these goals and the provided educational materials after my discharge from the hospital.  Patient/Caregiver Signature _______________________________ Date __________  Clinician Signature _______________________________________ Date __________  Please bring this form and your medication list with you to all your follow-up doctor's appointments.

## 2018-08-02 NOTE — Plan of Care (Signed)
  Problem: Consults Goal: [facility] BRAIN INJURY PATIENT EDUCATION Description Description: See Patient Education module for eduction specifics 08/02/2018 1024 by Erie Noe, LPN Outcome: Completed/Met 08/02/2018 1019 by Erie Noe, LPN Outcome: Progressing   Problem: RH BOWEL ELIMINATION Goal: RH STG MANAGE BOWEL WITH ASSISTANCE Description STG Manage Bowel with mod I Assistance.  08/02/2018 1024 by Erie Noe, LPN Outcome: Completed/Met 08/02/2018 1019 by Erie Noe, LPN Outcome: Progressing   Problem: RH BLADDER ELIMINATION Goal: RH STG MANAGE BLADDER WITH ASSISTANCE Description STG Manage Bladder With mod I Assistance  08/02/2018 1024 by Erie Noe, LPN Outcome: Completed/Met 08/02/2018 1019 by Erie Noe, LPN Outcome: Progressing   Problem: RH SAFETY Goal: RH STG ADHERE TO SAFETY PRECAUTIONS W/ASSISTANCE/DEVICE Description STG Adhere to Safety Precautions With mod I Assistance/Device.  08/02/2018 1024 by Erie Noe, LPN Outcome: Completed/Met 08/02/2018 1019 by Erie Noe, LPN Outcome: Progressing   Problem: RH PAIN MANAGEMENT Goal: RH STG PAIN MANAGED AT OR BELOW PT'S PAIN GOAL Description < 4  08/02/2018 1024 by Erie Noe, LPN Outcome: Completed/Met 08/02/2018 1019 by Erie Noe, LPN Outcome: Progressing   Problem: RH KNOWLEDGE DEFICIT BRAIN INJURY Goal: RH STG INCREASE KNOWLEDGE OF SELF CARE AFTER BRAIN INJURY Description Patient/family will verbalize/demonstrate understanding of safe self care strategies as outlined by rehab staff with mod I assist.  08/02/2018 1024 by Erie Noe, LPN Outcome: Completed/Met 08/02/2018 1019 by Erie Noe, LPN Outcome: Progressing

## 2018-08-02 NOTE — Progress Notes (Signed)
Pt DC summary complete by Linna Hoff, PA. DNR and MOST form given to pt's son. No questions or concerns noted. No c/o pain. Pt will be transported to main lobby for DC.   Erie Noe, LPN

## 2018-08-02 NOTE — Progress Notes (Signed)
Social Work  Discharge Note  The overall goal for the admission was met for:   Discharge location: Yes-HOME WITH HIRED CAREGIVERS & FAMILY  Length of Stay: Yes-9 DAYS  Discharge activity level: Yes-SUPERVISION LEVEL  Home/community participation: Yes  Services provided included: MD, RD, PT, OT, RN, CM, Pharmacy, Neuropsych and SW  Financial Services: Medicare and Private Insurance: [employer]  Follow-up services arranged: Home Health: [facility]-PT & OT and DME: [facility]  Comments (or additional information):DID WELL AND REACHED SUPERVISION QUICKLY. HAS HIRED HOME INSTEAD TO ASSIST WITH PROVIDING 24 HR SUPERVISION FOR HER AT HOME.  Patient/Family verbalized understanding of follow-up arrangements: Yes  Individual responsible for coordination of the follow-up plan: SELF & SON  Confirmed correct DME delivered: [name], Jessica G 08/02/2018    [name], Jessica G 

## 2018-08-03 ENCOUNTER — Telehealth: Payer: Self-pay | Admitting: Registered Nurse

## 2018-08-03 DIAGNOSIS — I2510 Atherosclerotic heart disease of native coronary artery without angina pectoris: Secondary | ICD-10-CM | POA: Diagnosis not present

## 2018-08-03 DIAGNOSIS — I129 Hypertensive chronic kidney disease with stage 1 through stage 4 chronic kidney disease, or unspecified chronic kidney disease: Secondary | ICD-10-CM | POA: Diagnosis not present

## 2018-08-03 DIAGNOSIS — I2584 Coronary atherosclerosis due to calcified coronary lesion: Secondary | ICD-10-CM | POA: Diagnosis not present

## 2018-08-03 DIAGNOSIS — Z9181 History of falling: Secondary | ICD-10-CM | POA: Diagnosis not present

## 2018-08-03 DIAGNOSIS — Z85118 Personal history of other malignant neoplasm of bronchus and lung: Secondary | ICD-10-CM | POA: Diagnosis not present

## 2018-08-03 DIAGNOSIS — Z791 Long term (current) use of non-steroidal anti-inflammatories (NSAID): Secondary | ICD-10-CM | POA: Diagnosis not present

## 2018-08-03 DIAGNOSIS — C7931 Secondary malignant neoplasm of brain: Secondary | ICD-10-CM | POA: Diagnosis not present

## 2018-08-03 DIAGNOSIS — Z85820 Personal history of malignant melanoma of skin: Secondary | ICD-10-CM | POA: Diagnosis not present

## 2018-08-03 DIAGNOSIS — N183 Chronic kidney disease, stage 3 (moderate): Secondary | ICD-10-CM | POA: Diagnosis not present

## 2018-08-03 DIAGNOSIS — G8194 Hemiplegia, unspecified affecting left nondominant side: Secondary | ICD-10-CM | POA: Diagnosis not present

## 2018-08-03 DIAGNOSIS — C3412 Malignant neoplasm of upper lobe, left bronchus or lung: Secondary | ICD-10-CM | POA: Diagnosis not present

## 2018-08-03 DIAGNOSIS — Z923 Personal history of irradiation: Secondary | ICD-10-CM | POA: Diagnosis not present

## 2018-08-03 NOTE — Telephone Encounter (Signed)
Transitional Care call  Patient name:[name]  DOB: 05/03/1941 1. Are you/is patient experiencing any problems since coming home? No a. Are there any questions regarding any aspect of care? No 2. Are there any questions regarding medications administration/dosing? No a. Are meds being taken as prescribed? Yes 3. "Patient should review meds with caller to confirm" Medication List Reviewed 4. Have there been any falls? No 5. Has Home Health been to the house and/or have they contacted you? Yes, [facility] a. If not, have you tried to contact them? NA b. Can we help you contact them? NA 6. Are bowels and bladder emptying properly? Yes a. Are there any unexpected incontinence issues? No b. If applicable, is patient following bowel/bladder programs? NA 7. Any fevers, problems with breathing, unexpected pain? No 8. Are there any skin problems or new areas of breakdown? No 9. Has the patient/family member arranged specialty MD follow up (ie cardiology/neurology/renal/surgical/etc.)?  Yes a. Can we help arrange? NA 10. Does the patient need any other services or support that we can help arrange? No 11. Are caregivers following through as expected in assisting the patient? Yes 12. Has the patient quit smoking, drinking alcohol, or using drugs as recommended? Ms. Hove reports she doesn't smoke, drink alcohol or use illicit drugs.   Appointment date/time 08/14/2018  arrival time 2:40 for 3:00 appointment with Dr. Letta Pate. At [place]

## 2018-08-06 ENCOUNTER — Telehealth: Payer: Self-pay | Admitting: *Deleted

## 2018-08-06 NOTE — Telephone Encounter (Signed)
Spoke with the pt and informed her that per her discharge instructions from her admission last week, she really needed to get in with Dr Meda Coffee for close follow-up.  Informed the pt that after that visit, we could possibly arrange follow-up as needed, under Dr Meda Coffee recommendations, being she has mets to the brain and receiving radiation, and is extremely weak.  But advised the pt that we need to see her this week, to make sure she is stable from a cardiac perspective, before we do that.  Pt verbalized understanding and agreed with this plan.  Pt will come in for her scheduled appt with Dr Meda Coffee on 08/08/18.

## 2018-08-06 NOTE — Telephone Encounter (Signed)
-----   Message from Earnestine Mealing sent at 08/06/2018 11:38 AM EST ----- [name] pt to schedule event monitor.  She stated that she just got out of the hospital last week and she did not feel like she needed the monitor.  Pt also want to cancel her 08-08-18 appt with Dr Meda Coffee stating that she is still weak and not able to come in at this time.

## 2018-08-07 ENCOUNTER — Telehealth: Payer: Self-pay | Admitting: *Deleted

## 2018-08-07 DIAGNOSIS — I2584 Coronary atherosclerosis due to calcified coronary lesion: Secondary | ICD-10-CM | POA: Diagnosis not present

## 2018-08-07 DIAGNOSIS — G8194 Hemiplegia, unspecified affecting left nondominant side: Secondary | ICD-10-CM | POA: Diagnosis not present

## 2018-08-07 DIAGNOSIS — Z923 Personal history of irradiation: Secondary | ICD-10-CM | POA: Diagnosis not present

## 2018-08-07 DIAGNOSIS — C7931 Secondary malignant neoplasm of brain: Secondary | ICD-10-CM | POA: Diagnosis not present

## 2018-08-07 DIAGNOSIS — C3412 Malignant neoplasm of upper lobe, left bronchus or lung: Secondary | ICD-10-CM | POA: Diagnosis not present

## 2018-08-07 DIAGNOSIS — Z85118 Personal history of other malignant neoplasm of bronchus and lung: Secondary | ICD-10-CM | POA: Diagnosis not present

## 2018-08-07 NOTE — Telephone Encounter (Signed)
[name], PT, [facility] left a message asking for HHPT 2week3 followed by [employer].  Medical record reviewed. Social work note reviewed.  Verbal orders given per office protocol.

## 2018-08-08 ENCOUNTER — Telehealth: Payer: Self-pay

## 2018-08-08 ENCOUNTER — Ambulatory Visit: Payer: Medicare Other | Admitting: Cardiology

## 2018-08-08 DIAGNOSIS — C7931 Secondary malignant neoplasm of brain: Secondary | ICD-10-CM | POA: Diagnosis not present

## 2018-08-08 DIAGNOSIS — I2584 Coronary atherosclerosis due to calcified coronary lesion: Secondary | ICD-10-CM | POA: Diagnosis not present

## 2018-08-08 DIAGNOSIS — G8194 Hemiplegia, unspecified affecting left nondominant side: Secondary | ICD-10-CM | POA: Diagnosis not present

## 2018-08-08 DIAGNOSIS — Z85118 Personal history of other malignant neoplasm of bronchus and lung: Secondary | ICD-10-CM | POA: Diagnosis not present

## 2018-08-08 DIAGNOSIS — C3412 Malignant neoplasm of upper lobe, left bronchus or lung: Secondary | ICD-10-CM | POA: Diagnosis not present

## 2018-08-08 DIAGNOSIS — Z923 Personal history of irradiation: Secondary | ICD-10-CM | POA: Diagnosis not present

## 2018-08-08 NOTE — Telephone Encounter (Signed)
[name], OT from [facility] called requesting verbal orders for [place] 2wk4. Orders approved and given per discharge summary.

## 2018-08-09 DIAGNOSIS — G8194 Hemiplegia, unspecified affecting left nondominant side: Secondary | ICD-10-CM | POA: Diagnosis not present

## 2018-08-09 DIAGNOSIS — Z85118 Personal history of other malignant neoplasm of bronchus and lung: Secondary | ICD-10-CM | POA: Diagnosis not present

## 2018-08-09 DIAGNOSIS — C7931 Secondary malignant neoplasm of brain: Secondary | ICD-10-CM | POA: Diagnosis not present

## 2018-08-09 DIAGNOSIS — Z923 Personal history of irradiation: Secondary | ICD-10-CM | POA: Diagnosis not present

## 2018-08-09 DIAGNOSIS — C3412 Malignant neoplasm of upper lobe, left bronchus or lung: Secondary | ICD-10-CM | POA: Diagnosis not present

## 2018-08-09 DIAGNOSIS — I2584 Coronary atherosclerosis due to calcified coronary lesion: Secondary | ICD-10-CM | POA: Diagnosis not present

## 2018-08-10 DIAGNOSIS — Z923 Personal history of irradiation: Secondary | ICD-10-CM | POA: Diagnosis not present

## 2018-08-10 DIAGNOSIS — C3412 Malignant neoplasm of upper lobe, left bronchus or lung: Secondary | ICD-10-CM | POA: Diagnosis not present

## 2018-08-10 DIAGNOSIS — I2584 Coronary atherosclerosis due to calcified coronary lesion: Secondary | ICD-10-CM | POA: Diagnosis not present

## 2018-08-10 DIAGNOSIS — G8194 Hemiplegia, unspecified affecting left nondominant side: Secondary | ICD-10-CM | POA: Diagnosis not present

## 2018-08-10 DIAGNOSIS — Z85118 Personal history of other malignant neoplasm of bronchus and lung: Secondary | ICD-10-CM | POA: Diagnosis not present

## 2018-08-10 DIAGNOSIS — C7931 Secondary malignant neoplasm of brain: Secondary | ICD-10-CM | POA: Diagnosis not present

## 2018-08-13 ENCOUNTER — Telehealth: Payer: Self-pay | Admitting: *Deleted

## 2018-08-13 DIAGNOSIS — I2584 Coronary atherosclerosis due to calcified coronary lesion: Secondary | ICD-10-CM | POA: Diagnosis not present

## 2018-08-13 DIAGNOSIS — Z85118 Personal history of other malignant neoplasm of bronchus and lung: Secondary | ICD-10-CM | POA: Diagnosis not present

## 2018-08-13 DIAGNOSIS — C7931 Secondary malignant neoplasm of brain: Secondary | ICD-10-CM | POA: Diagnosis not present

## 2018-08-13 DIAGNOSIS — G8194 Hemiplegia, unspecified affecting left nondominant side: Secondary | ICD-10-CM | POA: Diagnosis not present

## 2018-08-13 DIAGNOSIS — C3412 Malignant neoplasm of upper lobe, left bronchus or lung: Secondary | ICD-10-CM | POA: Diagnosis not present

## 2018-08-13 DIAGNOSIS — Z923 Personal history of irradiation: Secondary | ICD-10-CM | POA: Diagnosis not present

## 2018-08-13 NOTE — Telephone Encounter (Signed)
-----   Message from Earnestine Mealing sent at 08/13/2018 10:08 AM EST ----- Regarding: Cardiac event monitor FYI, Called pt to schedule her cardiac event monitor ordered by Dr Meda Coffee.  Pt states that she has recently been in the hosp and did not feel like she needed to get this monitor.  Just letting you know.

## 2018-08-14 ENCOUNTER — Encounter: Payer: Medicare Other | Admitting: Physical Medicine & Rehabilitation

## 2018-08-14 NOTE — Telephone Encounter (Signed)
Leroy Kennedy, PT, [facility] called back requesting to update HHPT orders to [facility]. Medical record reviewed. Social work note reviewed. Verbal orders given per office protocol.

## 2018-08-15 ENCOUNTER — Telehealth: Payer: Self-pay | Admitting: *Deleted

## 2018-08-15 DIAGNOSIS — C3412 Malignant neoplasm of upper lobe, left bronchus or lung: Secondary | ICD-10-CM | POA: Diagnosis not present

## 2018-08-15 DIAGNOSIS — G8194 Hemiplegia, unspecified affecting left nondominant side: Secondary | ICD-10-CM | POA: Diagnosis not present

## 2018-08-15 DIAGNOSIS — Z85118 Personal history of other malignant neoplasm of bronchus and lung: Secondary | ICD-10-CM | POA: Diagnosis not present

## 2018-08-15 DIAGNOSIS — I2584 Coronary atherosclerosis due to calcified coronary lesion: Secondary | ICD-10-CM | POA: Diagnosis not present

## 2018-08-15 DIAGNOSIS — C7931 Secondary malignant neoplasm of brain: Secondary | ICD-10-CM | POA: Diagnosis not present

## 2018-08-15 DIAGNOSIS — Z923 Personal history of irradiation: Secondary | ICD-10-CM | POA: Diagnosis not present

## 2018-08-15 NOTE — Telephone Encounter (Signed)
Kat OT requesting changes to her POC  To 2wk2,1wk2.  Approval given.

## 2018-08-17 DIAGNOSIS — I2584 Coronary atherosclerosis due to calcified coronary lesion: Secondary | ICD-10-CM | POA: Diagnosis not present

## 2018-08-17 DIAGNOSIS — Z85118 Personal history of other malignant neoplasm of bronchus and lung: Secondary | ICD-10-CM | POA: Diagnosis not present

## 2018-08-17 DIAGNOSIS — C3412 Malignant neoplasm of upper lobe, left bronchus or lung: Secondary | ICD-10-CM | POA: Diagnosis not present

## 2018-08-17 DIAGNOSIS — Z923 Personal history of irradiation: Secondary | ICD-10-CM | POA: Diagnosis not present

## 2018-08-17 DIAGNOSIS — G8194 Hemiplegia, unspecified affecting left nondominant side: Secondary | ICD-10-CM | POA: Diagnosis not present

## 2018-08-17 DIAGNOSIS — C7931 Secondary malignant neoplasm of brain: Secondary | ICD-10-CM | POA: Diagnosis not present

## 2018-08-20 ENCOUNTER — Inpatient Hospital Stay: Payer: Medicare Other

## 2018-08-20 ENCOUNTER — Inpatient Hospital Stay: Payer: Medicare Other | Attending: Internal Medicine | Admitting: Internal Medicine

## 2018-08-20 ENCOUNTER — Telehealth: Payer: Self-pay

## 2018-08-20 ENCOUNTER — Encounter: Payer: Self-pay | Admitting: Internal Medicine

## 2018-08-20 VITALS — BP 147/68 | HR 74 | Temp 98.0°F | Resp 20

## 2018-08-20 DIAGNOSIS — C3492 Malignant neoplasm of unspecified part of left bronchus or lung: Secondary | ICD-10-CM

## 2018-08-20 DIAGNOSIS — Z9181 History of falling: Secondary | ICD-10-CM | POA: Diagnosis not present

## 2018-08-20 DIAGNOSIS — C3412 Malignant neoplasm of upper lobe, left bronchus or lung: Secondary | ICD-10-CM

## 2018-08-20 DIAGNOSIS — I10 Essential (primary) hypertension: Secondary | ICD-10-CM

## 2018-08-20 DIAGNOSIS — C7931 Secondary malignant neoplasm of brain: Secondary | ICD-10-CM | POA: Diagnosis not present

## 2018-08-20 NOTE — Telephone Encounter (Signed)
Printed avs and calender of upcoming appointment. Per 2/10 los 

## 2018-08-20 NOTE — Progress Notes (Signed)
[place] [phone]   [phone]) [phone]  OFFICE PROGRESS NOTE  Wenda Low, MD [address] [place] [address]  PRINCIPAL DIAGNOSES:  1. 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 The patient presented at that time with a right upper lobe lesion, as well as suspicious ground-glass opacities in the left lung. The tumor was negative for EGFR mutation and negative for ALK gene translocation. Veristrat test Good. EGFR mutation studies were conflicting, initial test was negative, second test was positive for EGFR mutation in exon 18 and the the test from [facility] one was negative with positive KRAS mutation in Exon 12. 2. Stage IA malignant melanoma, status post wide excision by Dr. Allyson Sabal on Nov 12, 2008.  Genomic Alterations Identified? KDR amplification - equivocal? KRAS G12C KIT amplification - equivocal? TP53 [id number] RICTOR amplification CDKN2A/B loss FGF10 amplification Additional Disease-relevant Genes with No Reportable  Alterations Detected EGFR  PRIOR THERAPY:  1) Status post right upper lobectomy with lymph node dissection under the care of Dr. Arlyce Dice on May 21, 2008.  2) Stereotactic radiotherapy to the left upper lobe lung nodule under the care of Dr. Pablo Ledger expected to be completed on 10/21/2011.  3) stereotactic radiotherapy to the left prevascular lymphadenopathy under the care of Dr. Pablo Ledger completed on 09/26/2013. 4) Tarceva 150 mg by mouth daily started 11/29/2013 discontinued today secondary to intolerance with persistent diarrhea and fatigue.  5) Tarceva 100 mg by mouth daily started on 12/27/2013. Discontinued one week after treatment because of intolerance. 6) status post thermal ablation of the left upper lobe recurrent adenocarcinoma under the care of Dr. Laurence Ferrari on  10/01/2014 7) stereotactic radiotherapy to multiple brain metastasis diagnosed in January 2020 under the care of Dr. Lisbeth Renshaw.  CURRENT THERAPY: Observation.  Advanced directives: The patient has advanced directive and no change is requested.  INTERVAL HISTORY: [name] 78 y.o. female returns to the clinic today for follow-up visit accompanied by her son.  The patient has a fall in January 2020 and CT scan of the head followed by MRI of the brain showed multiple brain metastasis.  She was seen by radiation oncology and the patient underwent stereotactic radiotherapy to multiple brain metastasis.  She is feeling much better today.  She is currently on a taper dose of Decadron 4 mg p.o. 3 times daily.  She denied having any current chest pain, shortness of breath except with exertion with no cough or hemoptysis.  She denied having any recent weight loss or night sweats.  She has no nausea, vomiting, diarrhea or constipation.  She is here today for evaluation and recommendation regarding her condition.  MEDICAL HISTORY: Past Medical History:  Diagnosis Date  . Anemia 09/2017  . Arthritis    back.and feet  . Cervical ca ([place]) dx'd 1988   surg only  . CKD (chronic kidney disease), stage III ([place])   . Complication of anesthesia    difficulty awakening, dysrhythmia post surgery, (prolonged sedation level)  . Coronary artery calcification seen on CT scan   . Dyspnea   . GERD (gastroesophageal reflux disease)   . Headache    occ. sinus type headaches  . History of hiatal hernia   . Hx of radiation therapy 09/16/13-09/30/13   prevascular lymph node  . Hyperlipidemia   . Hypertension   . Hyperthyroidism    dr Loanne Drilling- radioactive iodine treatment  2011- resolved  . Hypothyroidism   . LBBB (left bundle branch block)   . Lung cancer ([place])    Bilaterally, RUL lobectomy and spot involving both lungs- "cancer"  . Macular degeneration    bilateral  . Melanoma ([place])    left leg  . Radiation  10/11/11-10/21/11   Left upper lobe adenocarcinoma  . Tremor    hand- intention tremor    ALLERGIES:  is allergic to meperidine hcl; morphine; penicillins; oxycodone-acetaminophen; propoxyphene n-acetaminophen; and tramadol hcl.  MEDICATIONS:  Current Outpatient Medications  Medication Sig Dispense Refill  . acetaminophen (TYLENOL) 325 MG tablet Take 2 tablets (650 mg total) by mouth every 6 (six) hours.    Marland Kitchen amLODipine (NORVASC) 10 MG tablet Take 1 tablet (10 mg total) by mouth daily. 30 tablet 11  . atorvastatin (LIPITOR) 80 MG tablet Take 1 tablet (80 mg total) by mouth daily. 90 tablet 3  . Calcium Carbonate-Vitamin D3 (CALCIUM 600-D) 600-400 MG-UNIT TABS Take 1 tablet by mouth every morning. 60 tablet 0  . carvedilol (COREG) 25 MG tablet Take 1 tablet (25 mg total) by mouth 2 (two) times daily. 60 tablet 11  . dexamethasone (DECADRON) 4 MG tablet Take 1 tablet (4 mg total) by mouth every 8 (eight) hours. 60 tablet 0  . etodolac (LODINE) 400 MG tablet Take 1 tablet (400 mg total) by mouth 2 (two) times daily. 60 tablet 3  . fluticasone (FLONASE) 50 MCG/ACT nasal spray Place 1 spray into both nostrils daily as needed for allergies.  2  . gabapentin (NEURONTIN) 300 MG capsule Take 2 capsules (600 mg total) by mouth at bedtime. 60 capsule 2  . ketotifen (ZADITOR) 0.025 % ophthalmic solution Place 1 drop into the right eye 2 (two) times daily as needed (irritation). 5 mL 0  . levothyroxine (SYNTHROID, LEVOTHROID) 88 MCG tablet Take 1 tablet (88 mcg total) by mouth daily before breakfast. 30 tablet 1  . losartan (COZAAR) 50 MG tablet Take 1 tablet (50 mg total) by mouth 2 (two) times daily. 180 tablet 3  . morphine (MSIR) 15 MG tablet Take 0.5 tablets (7.5 mg total) by mouth every 4 (four) hours as needed for severe pain. 30 tablet 0  . Multiple Vitamin (MULTIVITAMIN WITH MINERALS) TABS tablet Take 1 tablet by mouth daily.    . Multiple Vitamins-Minerals (PRESERVISION AREDS 2 PO) Take 1 capsule  by mouth at bedtime.     . nitroGLYCERIN (NITROSTAT) 0.4 MG SL tablet Place 1 tablet (0.4 mg total) under the tongue every 5 (five) minutes as needed for chest pain.  0  . pantoprazole (PROTONIX) 40 MG tablet Take 1 tablet (40 mg total) by mouth 2 (two) times daily. 60 tablet 0   No current facility-administered medications for this visit.     SURGICAL HISTORY:  Past Surgical History:  Procedure Laterality Date  . basal cell and squamous cell skin cell area removed     new left lower eye lid done with skin graft   . CARDIAC CATHETERIZATION  09/21/2017  . CATARACT EXTRACTION, BILATERAL Bilateral   . COLONOSCOPY WITH PROPOFOL N/A 10/27/2016   Procedure: COLONOSCOPY WITH PROPOFOL;  Surgeon: Laurence Spates, MD;  Location: [facility];  Service: Endoscopy;  Laterality: N/A;  . conization for cervical dysplasia    . CORONARY STENT INTERVENTION N/A 09/21/2017   Procedure: CORONARY STENT INTERVENTION;  Surgeon: Belva Crome, MD;  Location: [place];  Service: Cardiovascular;  Laterality: N/A;  . CORONARY/GRAFT ACUTE MI REVASCULARIZATION N/A  09/21/2017   Procedure: Coronary/Graft Acute MI Revascularization;  Surgeon: Belva Crome, MD;  Location: [facility];  Service: Cardiovascular;  Laterality: N/A;  . ESOPHAGOGASTRODUODENOSCOPY (EGD) WITH PROPOFOL N/A 09/28/2017   Procedure: ESOPHAGOGASTRODUODENOSCOPY (EGD) WITH PROPOFOL;  Surgeon: Arta Silence, MD;  Location: [facility];  Service: Endoscopy;  Laterality: N/A;  . ESOPHAGOGASTRODUODENOSCOPY (EGD) WITH PROPOFOL N/A 09/30/2017   Procedure: ESOPHAGOGASTRODUODENOSCOPY (EGD) WITH PROPOFOL;  Surgeon: Otis Brace, MD;  Location: [facility];  Service: Gastroenterology;  Laterality: N/A;  . esophogus stretching    . GIVENS CAPSULE STUDY N/A 10/06/2017   Procedure: GIVENS CAPSULE STUDY;  Surgeon: Arta Silence, MD;  Location: [facility] ENDOSCOPY;  Service: Endoscopy;  Laterality: N/A;  . INTRAVASCULAR ULTRASOUND/IVUS N/A 09/21/2017    Procedure: Intravascular Ultrasound/IVUS;  Surgeon: Belva Crome, MD;  Location: [facility];  Service: Cardiovascular;  Laterality: N/A;  . IR GENERIC HISTORICAL  09/11/2014   IR RADIOLOGIST EVAL & MGMT 09/11/2014 Jacqulynn Cadet, MD [facility] INTERV RAD  . LEFT HEART CATH AND CORONARY ANGIOGRAPHY N/A 09/21/2017   Procedure: LEFT HEART CATH AND CORONARY ANGIOGRAPHY;  Surgeon: Belva Crome, MD;  Location: [facility];  Service: Cardiovascular;  Laterality: N/A;  . LEFT HEART CATH AND CORONARY ANGIOGRAPHY N/A 09/21/2017   Procedure: LEFT HEART CATH AND CORONARY ANGIOGRAPHY;  Surgeon: Belva Crome, MD;  Location: [facility];  Service: Cardiovascular;  Laterality: N/A;  . radio frequency ablation on lung spot    . rul lobectomy  2010  . TONSILLECTOMY    . TONSILLECTOMY      REVIEW OF SYSTEMS:  A comprehensive review of systems was negative except for: Constitutional: positive for fatigue Respiratory: positive for dyspnea on exertion Musculoskeletal: positive for muscle weakness   PHYSICAL EXAMINATION: General appearance: alert, cooperative, fatigued and no distress Head: Normocephalic, without obvious abnormality, Contusion on the left side of the face. Neck: no adenopathy Lymph nodes: Cervical, supraclavicular, and axillary nodes normal. Resp: clear to auscultation bilaterally Back: symmetric, no curvature. ROM normal. No CVA tenderness. Cardio: regular rate and rhythm, S1, S2 normal, no murmur, click, rub or gallop GI: soft, non-tender; bowel sounds normal; no masses,  no organomegaly Extremities: extremities normal, atraumatic, no cyanosis or edema  ECOG PERFORMANCE STATUS: 1 - Symptomatic but completely ambulatory  Blood pressure (!) 147/68, pulse 74, temperature 98 F (36.7 C), temperature source Oral, resp. rate 20, SpO2 95 %.  LABORATORY DATA: Lab Results  Component Value Date   WBC 10.6 (H) 07/25/2018   HGB 12.2 07/25/2018   HCT 36.7 07/25/2018   MCV  91.1 07/25/2018   PLT 201 07/25/2018      Chemistry      Component Value Date/Time   NA 138 07/30/2018 0638   NA 137 09/20/2017 0958   NA 141 05/29/2017 1136   K 4.4 07/30/2018 0638   K 4.5 05/29/2017 1136   CL 104 07/30/2018 0638   CL 105 07/17/2012 1434   CO2 26 07/30/2018 0638   CO2 28 05/29/2017 1136   BUN 26 (H) 07/30/2018 0638   BUN 25 09/20/2017 0958   BUN 26.1 (H) 05/29/2017 1136   CREATININE 0.93 07/30/2018 0638   CREATININE 1.05 (H) 06/18/2018 1014   CREATININE 1.3 (H) 05/29/2017 1136      Component Value Date/Time   CALCIUM 8.9 07/30/2018 0638   CALCIUM 10.4 05/29/2017 1136   ALKPHOS 50 07/25/2018 0520   ALKPHOS 59 05/29/2017 1136   AST 26 07/25/2018 0520  AST 15 05/29/2017 1136   ALT 35 07/25/2018 0520   ALT 12 05/29/2017 1136   BILITOT 0.6 07/25/2018 0520   BILITOT 0.4 01/01/2018 0933   BILITOT 0.51 05/29/2017 1136       RADIOGRAPHIC STUDIES: Dg Chest 2 View  Result Date: 07/30/2018 CLINICAL DATA:  Recent fall with shortness of Breath EXAM: CHEST - 2 VIEW COMPARISON:  06/18/2018 FINDINGS: Cardiac shadow is stable. Aortic calcifications are again seen. Right lung is clear. Persistent left upper lobe mass is noted stable from the prior exam. Postsurgical changes in the right hilum are again noted. No effusions are seen. Old rib fractures on the right are noted with healing. IMPRESSION: Stable left upper lobe mass lesion. Electronically Signed   By: Inez Catalina M.D.   On: 07/30/2018 14:52   Mr Jeri Cos EQ Contrast  Result Date: 07/23/2018 CLINICAL DATA:  Recurrent metastatic lung cancer stereotactic radiosurgery planning. EXAM: MRI HEAD WITHOUT AND WITH CONTRAST TECHNIQUE: Multiplanar, multiecho pulse sequences of the brain and surrounding structures were obtained without and with intravenous contrast. CONTRAST:  6 mL Gadavist COMPARISON:  Brain MRI 07/21/2018 FINDINGS: BRAIN: There are multiple contrast-enhancing lesions within the brain, consistent with  parenchymal metastases, annotated on series 11: 1. Right cerebellar hemisphere, 2 mm, unchanged, image 56 2. Left cerebellar hemisphere, 14 mm, unchanged, image 62 3. Left temporal lobe, 12 mm, unchanged, image 66. Mild surrounding edema, unchanged. 4. Left pons, 8 mm, unchanged, image 70 5. Medial right temporal lobe, 7 mm, unchanged, image 78 6. Inferior left frontal lobe, 5 mm, unchanged, image 79 7. Right occipital lobe, 3 mm, not visible on the prior study (probably due to technical factors), image 93 8. Right frontal lobe, 20 mm, unchanged, image 111. Moderate surrounding edema, unchanged. 9. Posterior right parietal lobe, 8 mm, unchanged, image 120 10. Right parietal lobe postcentral sulcus, 15 mm, unchanged, image 140. Large amount of surrounding edema, unchanged. No acute infarct. No midline shift or herniation. Size and configuration of the ventricles are unchanged. VASCULAR: Major intracranial arterial and venous sinus flow voids are normal. SKULL AND UPPER CERVICAL SPINE: The bone marrow signal of the calvarium is normal. There are 2 left periorbital soft tissue lesions imbedded within the subcutaneous fat. The more inferior lesion measures 14 mm. The more superior lesion measures 13 mm. SINUSES/ORBITS: No fluid levels or advanced mucosal thickening. No mastoid or middle ear effusion. The orbits are normal. IMPRESSION: 1. Unchanged appearance of 9 brain parenchymal metastases with edema greatest in the right parietal and frontal lobes. A 10th metastasis was not visible on the prior study, probably due to differences in slice thickness. 2. Unchanged appearance of 2 lesions within the left periorbital subcutaneous fat. 3. No acute abnormality. Electronically Signed   By: Ulyses Jarred M.D.   On: 07/23/2018 13:52   Dg Femur Min 2 Views Left  Result Date: 07/26/2018 CLINICAL DATA:  Recent fall with left-sided pain, initial encounter EXAM: LEFT FEMUR 2 VIEWS COMPARISON:  None. FINDINGS: There is no  evidence of fracture or other focal bone lesions. Soft tissues are unremarkable. IMPRESSION: No acute abnormality noted. Electronically Signed   By: Inez Catalina M.D.   On: 07/26/2018 14:53   Vas [place] Lower Extremity Venous (dvt)  Result Date: 07/26/2018  Lower Venous Study Indications: Thigh pain.  Performing Technologist: Abram Sander RVS  Examination Guidelines: A complete evaluation includes B-mode imaging, spectral Doppler, color Doppler, and power Doppler as needed of all accessible portions of each vessel. Bilateral testing  is considered an integral part of a complete examination. Limited examinations for reoccurring indications may be performed as noted.  Right Venous Findings: +---+---------------+---------+-----------+----------+-------+    CompressibilityPhasicitySpontaneityPropertiesSummary +---+---------------+---------+-----------+----------+-------+ CFVFull           Yes      Yes                          +---+---------------+---------+-----------+----------+-------+  Left Venous Findings: +---------+---------------+---------+-----------+----------+-------+          CompressibilityPhasicitySpontaneityPropertiesSummary +---------+---------------+---------+-----------+----------+-------+ CFV      Full           Yes      Yes                          +---------+---------------+---------+-----------+----------+-------+ SFJ      Full                                                 +---------+---------------+---------+-----------+----------+-------+ FV Prox  Full                                                 +---------+---------------+---------+-----------+----------+-------+ FV Mid   Full                                                 +---------+---------------+---------+-----------+----------+-------+ FV DistalFull                                                 +---------+---------------+---------+-----------+----------+-------+ PFV      Full                                                  +---------+---------------+---------+-----------+----------+-------+ POP      Full           Yes      Yes                          +---------+---------------+---------+-----------+----------+-------+ PTV      Full                                                 +---------+---------------+---------+-----------+----------+-------+ PERO     Full                                                 +---------+---------------+---------+-----------+----------+-------+    Summary: Right: No evidence of common femoral vein obstruction. Left: There is no evidence of deep vein thrombosis in the lower extremity. No cystic structure found  in the popliteal fossa.  *See table(s) above for measurements and observations. Electronically signed by Servando Snare MD on 07/26/2018 at 3:28:58 PM.    Final    ASSESSMENT AND PLAN:  This is a very pleasant 78 years old white female with recurrent non-small cell lung cancer, adenocarcinoma with questionable EGFR mutation. The patient was treated in the past with Tarceva for a few weeks but this was discontinued secondary to internal returns and significant fatigue and the patient refused to resume any further treatment at that time. She underwent several local treatment including stereotactic radiotherapy as well as radiofrequency ablation to the current tumor in the left upper lobe. She has been in observation but unfortunately she had multiple new metastatic brain lesions in January 2020.  The patient status post stereotactic radiotherapy to these lesions. She is currently on a taper dose of Decadron 4 mg p.o. 3 times daily and she would like to start tapering her dose.  I recommended for the patient to change to 4 mg p.o. twice daily for 1-2 weeks and 4 mg p.o. daily. She will report any worsening of the weakness in the left left side. I will arrange for the patient to have repeat CT scan of the chest in around 2  months for reevaluation of her disease.  I discussed with the patient her treatment options and she again not interested in any systemic therapy.  She would consider palliative care and observation. She was advised to call immediately if she has any concerning symptoms in the interval. All questions were answered. The patient knows to call the clinic with any problems, questions or concerns. We can certainly see the patient much sooner if necessary.  Disclaimer: This note was dictated with voice recognition software. Similar sounding words can inadvertently be transcribed and may not be corrected upon review.

## 2018-08-21 DIAGNOSIS — C3412 Malignant neoplasm of upper lobe, left bronchus or lung: Secondary | ICD-10-CM | POA: Diagnosis not present

## 2018-08-21 DIAGNOSIS — I2584 Coronary atherosclerosis due to calcified coronary lesion: Secondary | ICD-10-CM | POA: Diagnosis not present

## 2018-08-21 DIAGNOSIS — C7931 Secondary malignant neoplasm of brain: Secondary | ICD-10-CM | POA: Diagnosis not present

## 2018-08-21 DIAGNOSIS — Z923 Personal history of irradiation: Secondary | ICD-10-CM | POA: Diagnosis not present

## 2018-08-21 DIAGNOSIS — Z85118 Personal history of other malignant neoplasm of bronchus and lung: Secondary | ICD-10-CM | POA: Diagnosis not present

## 2018-08-21 DIAGNOSIS — G8194 Hemiplegia, unspecified affecting left nondominant side: Secondary | ICD-10-CM | POA: Diagnosis not present

## 2018-08-23 DIAGNOSIS — Z923 Personal history of irradiation: Secondary | ICD-10-CM | POA: Diagnosis not present

## 2018-08-23 DIAGNOSIS — C7931 Secondary malignant neoplasm of brain: Secondary | ICD-10-CM | POA: Diagnosis not present

## 2018-08-23 DIAGNOSIS — N183 Chronic kidney disease, stage 3 (moderate): Secondary | ICD-10-CM | POA: Diagnosis not present

## 2018-08-23 DIAGNOSIS — C3412 Malignant neoplasm of upper lobe, left bronchus or lung: Secondary | ICD-10-CM | POA: Diagnosis not present

## 2018-08-23 DIAGNOSIS — I2584 Coronary atherosclerosis due to calcified coronary lesion: Secondary | ICD-10-CM | POA: Diagnosis not present

## 2018-08-23 DIAGNOSIS — Z9181 History of falling: Secondary | ICD-10-CM | POA: Diagnosis not present

## 2018-08-23 DIAGNOSIS — Z791 Long term (current) use of non-steroidal anti-inflammatories (NSAID): Secondary | ICD-10-CM | POA: Diagnosis not present

## 2018-08-23 DIAGNOSIS — I129 Hypertensive chronic kidney disease with stage 1 through stage 4 chronic kidney disease, or unspecified chronic kidney disease: Secondary | ICD-10-CM | POA: Diagnosis not present

## 2018-08-23 DIAGNOSIS — I2510 Atherosclerotic heart disease of native coronary artery without angina pectoris: Secondary | ICD-10-CM | POA: Diagnosis not present

## 2018-08-23 DIAGNOSIS — Z85820 Personal history of malignant melanoma of skin: Secondary | ICD-10-CM | POA: Diagnosis not present

## 2018-08-23 DIAGNOSIS — Z85118 Personal history of other malignant neoplasm of bronchus and lung: Secondary | ICD-10-CM | POA: Diagnosis not present

## 2018-08-23 DIAGNOSIS — G8194 Hemiplegia, unspecified affecting left nondominant side: Secondary | ICD-10-CM | POA: Diagnosis not present

## 2018-08-24 ENCOUNTER — Other Ambulatory Visit: Payer: Self-pay | Admitting: Radiation Oncology

## 2018-08-24 MED ORDER — NYSTATIN 100000 UNIT/ML MT SUSP: 5.0000 mL | Freq: Four times a day (QID) | OROMUCOSAL | 0 refills | Status: AC

## 2018-08-27 ENCOUNTER — Telehealth: Payer: Self-pay | Admitting: Radiation Therapy

## 2018-08-27 NOTE — Telephone Encounter (Signed)
Called to see if [name] was experiencing any relief from thrush in her mouth since starting the nystatin Friday 2/14. She said that she was unaware that medication had been called in and was thankful that I reached out today to let her know to go get it. She will start taking it this evening and is very hopeful that it will relieve the sores she has in her mouth.    Mont Dutton R.T.(R)(T) Special Procedures Navigator

## 2018-08-28 ENCOUNTER — Inpatient Hospital Stay: Payer: Medicare Other | Admitting: Physical Medicine & Rehabilitation

## 2018-08-30 DIAGNOSIS — Z923 Personal history of irradiation: Secondary | ICD-10-CM | POA: Diagnosis not present

## 2018-08-30 DIAGNOSIS — Z85118 Personal history of other malignant neoplasm of bronchus and lung: Secondary | ICD-10-CM | POA: Diagnosis not present

## 2018-08-30 DIAGNOSIS — C7931 Secondary malignant neoplasm of brain: Secondary | ICD-10-CM | POA: Diagnosis not present

## 2018-08-30 DIAGNOSIS — C3412 Malignant neoplasm of upper lobe, left bronchus or lung: Secondary | ICD-10-CM | POA: Diagnosis not present

## 2018-08-30 DIAGNOSIS — G8194 Hemiplegia, unspecified affecting left nondominant side: Secondary | ICD-10-CM | POA: Diagnosis not present

## 2018-08-30 DIAGNOSIS — I2584 Coronary atherosclerosis due to calcified coronary lesion: Secondary | ICD-10-CM | POA: Diagnosis not present

## 2018-08-30 NOTE — Progress Notes (Signed)
  Radiation Oncology         [phone] ________________________________  Name: [name] MRN: [id number]  Date: 08/01/2018  DOB: 02/06/1941  End of Treatment Note  Diagnosis:   78 y.o. female with Stage IA cT1aN0M0, NSCLC, adenocarcinomaRUL with EGFR mutation, s/p resection with recurrence/putative early disease in the LUL   Indication for treatment:  palliative       Radiation treatment dates:   08/01/2018  Site/dose:   Brain PTV1-10 // max dose = 134.9% PTV1: Rt cereb 1m // 20 Gy in 1 fraction PTV2: Lt cereb 167m// 20 Gy in 1 fraction PTV3: Lt temp 1241m/ 20 Gy in 1 fraction PTV4: Lt pons 8mm18m 14 Gy in 1 fraction PTV5: Rt temp 7mm 56m20 Gy in 1 fraction PTV6: Lt front 5mm /41m0 Gy in 1 fraction PTV7: Rt occ 3mm //67m Gy in 1 fraction PTV8: Rt frontal 20mm //51mGy in 1 fraction PTV9: Rt par 8mm // 270my in 1 fraction PTV10: Rt Parietal 15mm // 222m in 1 fraction   Beams/energy:   ExacTrac SBRT/SRT-VMAT, 7 VMAT Beams // 6X-FFF Photon  Narrative: The patient tolerated radiation treatment well.   There were no signs of acute toxicity after treatment.  Plan: The patient has completed radiation treatment. The patient will return to radiation oncology clinic for routine followup in one month. I advised the patient to call or return sooner if they have any questions or concerns related to their recovery or treatment. ________________________________  Khyan Oats S. MoJodelle Gross This document serves as a record of services personally performed by Quincey Quesinberry MoodyKyung Ruddas created on his behalf by Haley WoodRae Lipsd medical scribe. The creation of this record is based on the scribe's personal observations and the provider's statements to them. This document has been checked and approved by the attending provider.

## 2018-09-03 DIAGNOSIS — R404 Transient alteration of awareness: Secondary | ICD-10-CM | POA: Diagnosis not present

## 2018-09-03 DIAGNOSIS — W19XXXA Unspecified fall, initial encounter: Secondary | ICD-10-CM | POA: Diagnosis not present

## 2018-09-03 DIAGNOSIS — I499 Cardiac arrhythmia, unspecified: Secondary | ICD-10-CM | POA: Diagnosis not present

## 2018-09-03 DIAGNOSIS — R4020 Unspecified coma: Secondary | ICD-10-CM | POA: Diagnosis not present

## 2018-09-04 DIAGNOSIS — W19XXXA Unspecified fall, initial encounter: Secondary | ICD-10-CM | POA: Diagnosis not present

## 2018-09-04 DIAGNOSIS — R404 Transient alteration of awareness: Secondary | ICD-10-CM | POA: Diagnosis not present

## 2018-09-04 DIAGNOSIS — R4020 Unspecified coma: Secondary | ICD-10-CM | POA: Diagnosis not present

## 2018-09-04 DIAGNOSIS — I499 Cardiac arrhythmia, unspecified: Secondary | ICD-10-CM | POA: Diagnosis not present

## 2018-09-05 ENCOUNTER — Telehealth: Payer: Self-pay | Admitting: Radiation Oncology

## 2018-09-05 ENCOUNTER — Encounter: Payer: Self-pay | Admitting: Radiation Oncology

## 2018-09-05 NOTE — Telephone Encounter (Signed)
The pt's son called to let us know of the patient's passing. I called him back and left a message to give our condolences.

## 2018-09-06 ENCOUNTER — Ambulatory Visit: Payer: Self-pay | Admitting: Radiation Oncology

## 2018-09-09 DIAGNOSIS — 419620001 Death: Secondary | SNOMED CT | POA: Diagnosis not present

## 2018-09-09 DEATH — deceased

## 2018-10-25 ENCOUNTER — Other Ambulatory Visit: Payer: Medicare Other

## 2018-11-01 ENCOUNTER — Other Ambulatory Visit: Payer: Medicare Other

## 2018-11-01 ENCOUNTER — Ambulatory Visit: Payer: Medicare Other | Admitting: Internal Medicine

## 2020-09-07 IMAGING — CT CT CHEST W/ CM
2 of 4 series · 14 of 36 positions shown, 17 images · IV contrast (OMNIPAQUE)
Comparison: CT 03/15/2018 and 01/05/2018

Addendum:
CLINICAL DATA: Recurrent non-small cell lung cancer, initially
diagnosed in 2141 with right upper lobe resection. Subsequent left
upper lobe recurrence with thermal ablation, radiation and
chemotherapy.

EXAM:
CT CHEST WITH CONTRAST
TECHNIQUE: Multidetector CT imaging of the chest was performed during
intravenous contrast administration.
CONTRAST:  75mL OMNIPAQUE IOHEXOL 300 MG/ML  SOLN

[Series 2: axial st · axial · 0.64mm/px · z∈[-278,-14]mm · 11 of 154 slices shown, 14 images]
[im 11/154  mediastinal]
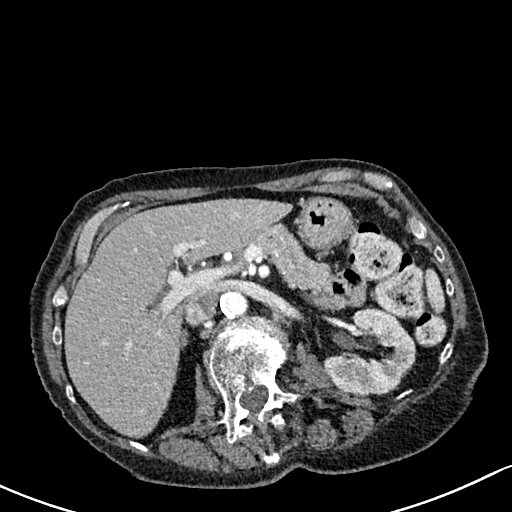
[im 11/154  lung]
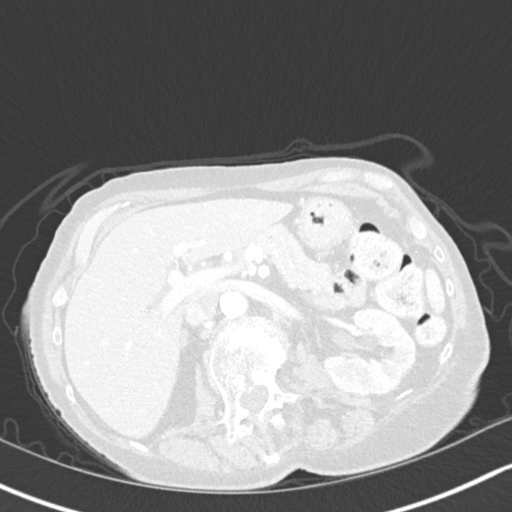
[im 22/154  lung]
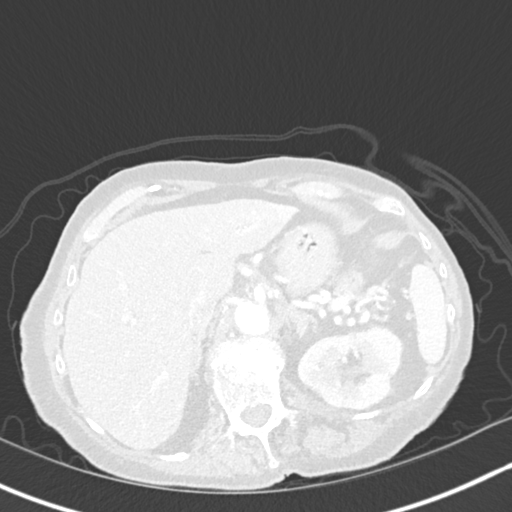
[im 33/154  lung]
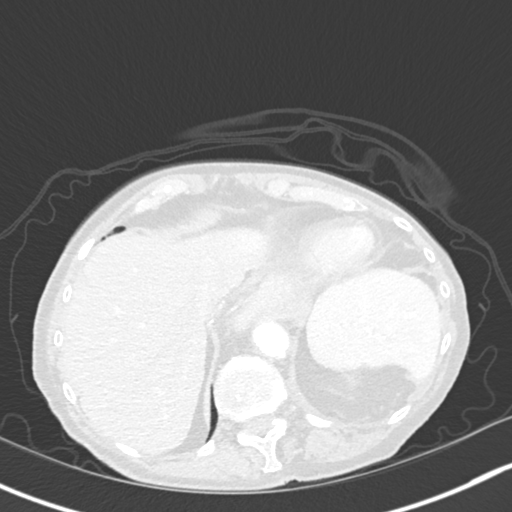
[im 55/154  lung]
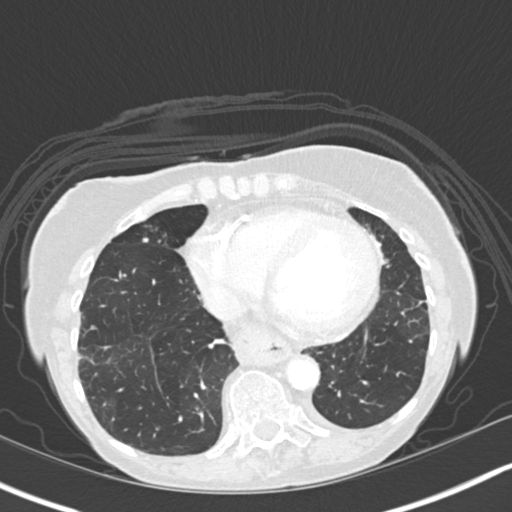
[im 66/154  mediastinal]
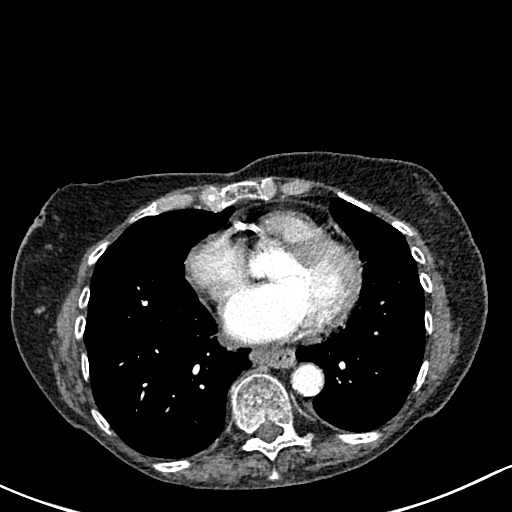
[im 66/154  lung]
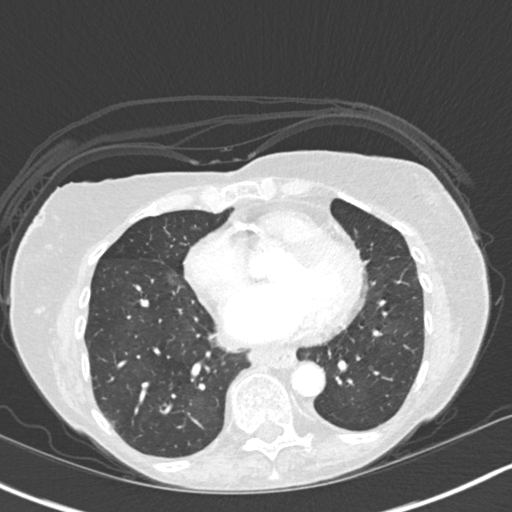
[im 77/154  lung]
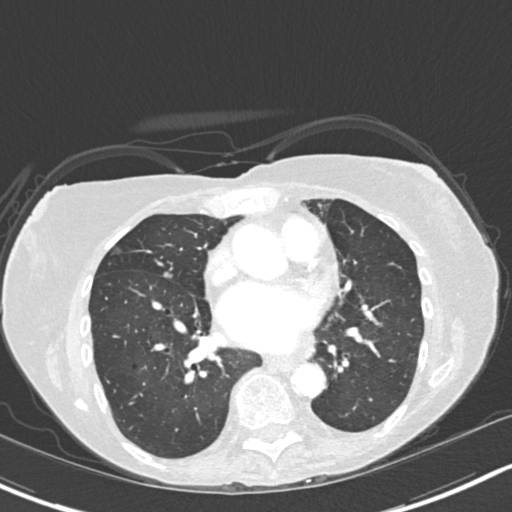
[im 88/154  lung]
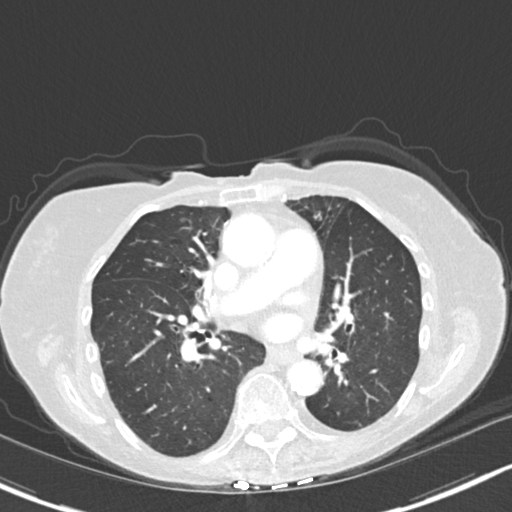
[im 99/154  lung]
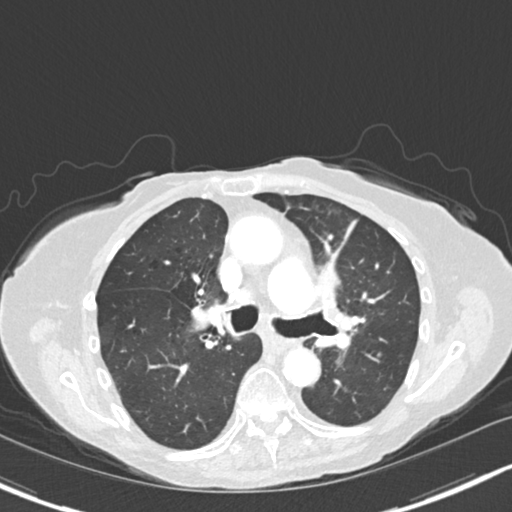
[im 121/154  mediastinal]
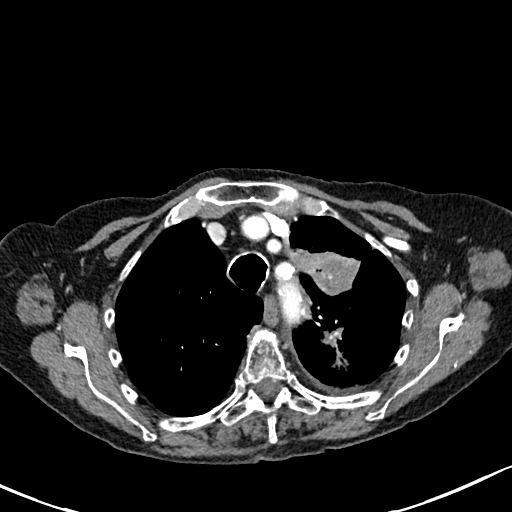
[im 121/154  lung]
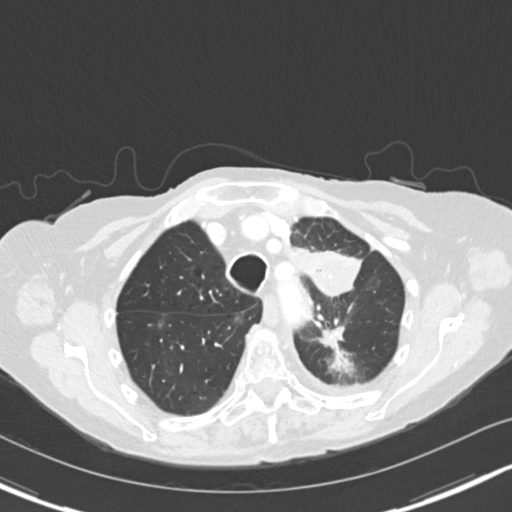
[im 132/154  lung]
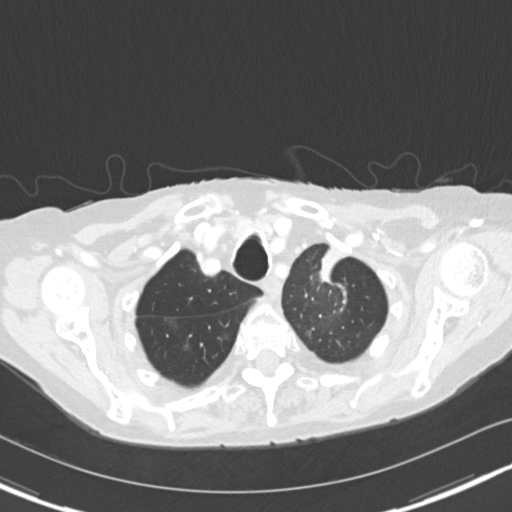
[im 143/154  lung]
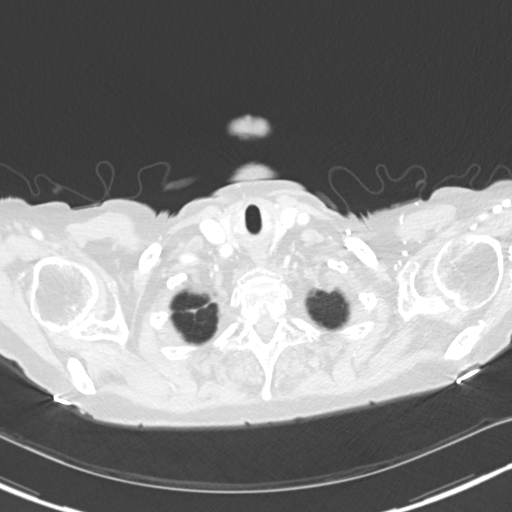

[Series 5: coronal · coronal · 0.67mm/px · 3 of 105 slices shown]
[im 21/105  lung]
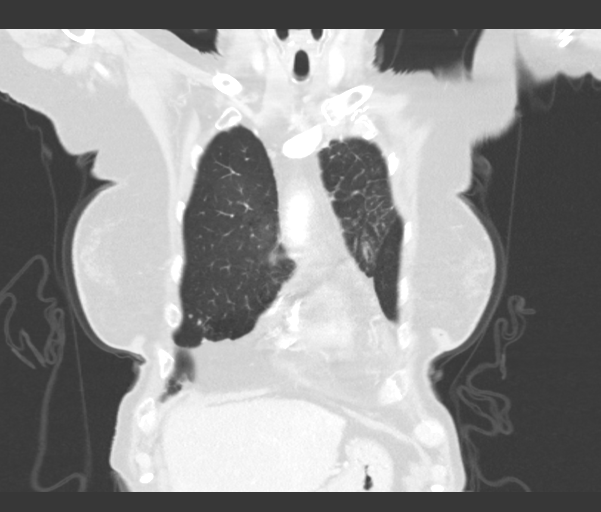
[im 42/105  lung]
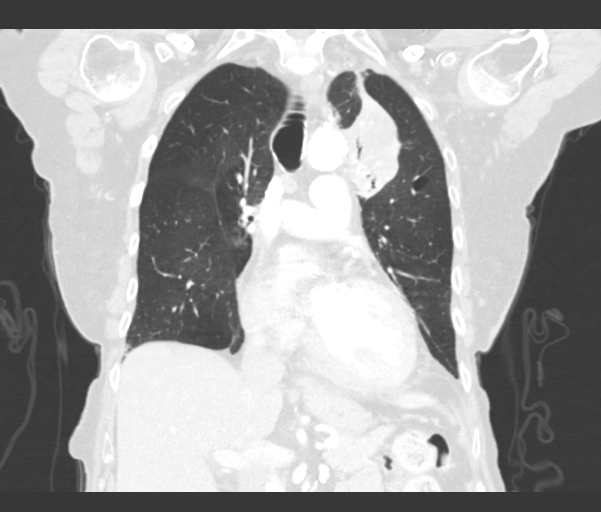
[im 63/105  lung]
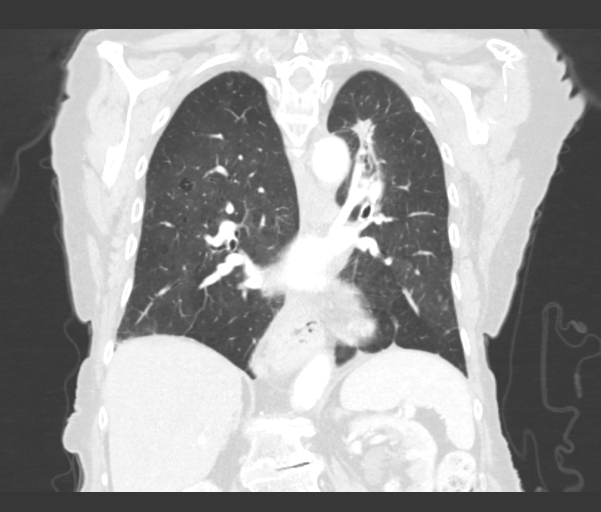

[14 of 36 positions shown; findings below may reference images not displayed]

FINDINGS: Cardiovascular: Moderate diffuse atherosclerosis of the aorta, great
vessels and coronary arteries. No acute vascular findings are
evident. Probable aortic valvular calcifications, as before. The
heart size is normal. There is no pericardial effusion.

Mediastinum/Nodes: There are no enlarged mediastinal, hilar or
axillary lymph nodes.Stable moderate size hiatal hernia.

Lungs/Pleura: There is no pleural effusion or pneumothorax. Patient
is status post right upper lobe resection. Scattered right lung
ground-glass opacities are stable. Clustered nodularity in the right
middle lobe is again noted, similar to previous studies. There are
slightly enlarging components measuring up to 6 mm on image 71/7 and
7 mm on image 77/7.

The dominant left upper lobe lesion shows further decrease in size,
measuring 3.4 x 3.2 cm on image [DATE] (previously 3.8 x 3.5 cm).
Likewise, the nodule posterior to the left hilum has improved,
measuring 1.8 x 1.1 cm on image [DATE] (previously 2.3 x 1.6 cm).
There are increased radiation changes posterior to this lesion.
Clustered nodularity in the superior segment of the left lower lobe
has mildly improved. 12 x 11 mm left lower lobe nodule adjacent to
the left heart border on image 67/7 is stable from recent prior
studies, although has enlarged from older prior studies and appears
mildly spiculated.

Upper abdomen: The visualized upper abdomen appears stable without
suspicious findings. There are bilateral renal cysts.

Musculoskeletal/Chest wall: There is no chest wall mass or
suspicious osseous finding. There is stable chronic rib and sternal
deformities.
IMPRESSION: 1. Further improvement in the dominant left upper lobe mass and
adjacent lesion in the superior segment of the left lower lobe,
consistent with response to therapy.
2. Fluctuating clustered nodularity in the superior segment of the
left lower lobe and in the right middle lobe, favored to be
inflammatory.
3. Slow growth of spiculated nodule medially in the left lower lobe
from remote prior studies, suspicious for new primary lung cancer.
4. No adenopathy.

ADDENDUM:
I forgot to mention the presence of high-grade stenosis at the
origin of the celiac trunk, a finding which appears chronic and
unchanged (image 130/2 and 71/6). There is associated stable
dilatation of the proximal common hepatic artery. No evidence of
acute large vessel occlusion.

*** End of Addendum ***
